# Patient Record
Sex: Male | Born: 1965 | Race: Black or African American | Hispanic: No | Marital: Single | Smoking: Never smoker
Health system: Southern US, Community
[De-identification: ages and names within clinical notes are randomized; demographics above are authoritative.]

## PROBLEM LIST (undated history)

## (undated) DIAGNOSIS — I10 Essential (primary) hypertension: Secondary | ICD-10-CM

## (undated) DIAGNOSIS — Q232 Congenital mitral stenosis: Secondary | ICD-10-CM

## (undated) DIAGNOSIS — G969 Disorder of central nervous system, unspecified: Secondary | ICD-10-CM

## (undated) DIAGNOSIS — I4819 Other persistent atrial fibrillation: Secondary | ICD-10-CM

## (undated) DIAGNOSIS — E119 Type 2 diabetes mellitus without complications: Secondary | ICD-10-CM

## (undated) HISTORY — PX: NOSE SURGERY: SHX723

---

## 2002-07-20 ENCOUNTER — Emergency Department (HOSPITAL_COMMUNITY): Admission: EM | Admit: 2002-07-20 | Discharge: 2002-07-20 | Payer: Self-pay | Admitting: Emergency Medicine

## 2017-02-02 DIAGNOSIS — Q232 Congenital mitral stenosis: Secondary | ICD-10-CM

## 2017-02-02 HISTORY — DX: Congenital mitral stenosis: Q23.2

## 2017-03-05 DIAGNOSIS — G969 Disorder of central nervous system, unspecified: Secondary | ICD-10-CM

## 2017-03-05 HISTORY — DX: Disorder of central nervous system, unspecified: G96.9

## 2017-03-12 ENCOUNTER — Ambulatory Visit: Payer: Self-pay | Admitting: Internal Medicine

## 2017-03-21 ENCOUNTER — Other Ambulatory Visit: Payer: Self-pay

## 2017-03-21 ENCOUNTER — Emergency Department (HOSPITAL_COMMUNITY): Payer: Medicaid Other

## 2017-03-21 ENCOUNTER — Inpatient Hospital Stay (HOSPITAL_COMMUNITY)
Admission: EM | Admit: 2017-03-21 | Discharge: 2017-04-09 | DRG: 853 | Disposition: A | Discharge status: Skilled Nursing Facility | Payer: Medicaid Other | Attending: Internal Medicine | Admitting: Internal Medicine

## 2017-03-21 ENCOUNTER — Encounter (HOSPITAL_COMMUNITY): Payer: Self-pay

## 2017-03-21 DIAGNOSIS — G992 Myelopathy in diseases classified elsewhere: Secondary | ICD-10-CM | POA: Diagnosis present

## 2017-03-21 DIAGNOSIS — Z7984 Long term (current) use of oral hypoglycemic drugs: Secondary | ICD-10-CM

## 2017-03-21 DIAGNOSIS — I480 Paroxysmal atrial fibrillation: Secondary | ICD-10-CM | POA: Diagnosis present

## 2017-03-21 DIAGNOSIS — S14106A Unspecified injury at C6 level of cervical spinal cord, initial encounter: Secondary | ICD-10-CM | POA: Diagnosis not present

## 2017-03-21 DIAGNOSIS — M4800 Spinal stenosis, site unspecified: Secondary | ICD-10-CM

## 2017-03-21 DIAGNOSIS — E875 Hyperkalemia: Secondary | ICD-10-CM | POA: Diagnosis not present

## 2017-03-21 DIAGNOSIS — G35 Multiple sclerosis: Secondary | ICD-10-CM | POA: Diagnosis present

## 2017-03-21 DIAGNOSIS — Z66 Do not resuscitate: Secondary | ICD-10-CM | POA: Diagnosis not present

## 2017-03-21 DIAGNOSIS — Z515 Encounter for palliative care: Secondary | ICD-10-CM

## 2017-03-21 DIAGNOSIS — I509 Heart failure, unspecified: Secondary | ICD-10-CM | POA: Diagnosis present

## 2017-03-21 DIAGNOSIS — R258 Other abnormal involuntary movements: Secondary | ICD-10-CM | POA: Diagnosis present

## 2017-03-21 DIAGNOSIS — I4891 Unspecified atrial fibrillation: Secondary | ICD-10-CM | POA: Diagnosis present

## 2017-03-21 DIAGNOSIS — Y838 Other surgical procedures as the cause of abnormal reaction of the patient, or of later complication, without mention of misadventure at the time of the procedure: Secondary | ICD-10-CM | POA: Diagnosis not present

## 2017-03-21 DIAGNOSIS — G959 Disease of spinal cord, unspecified: Secondary | ICD-10-CM | POA: Diagnosis present

## 2017-03-21 DIAGNOSIS — G061 Intraspinal abscess and granuloma: Secondary | ICD-10-CM | POA: Diagnosis present

## 2017-03-21 DIAGNOSIS — B9620 Unspecified Escherichia coli [E. coli] as the cause of diseases classified elsewhere: Secondary | ICD-10-CM | POA: Diagnosis present

## 2017-03-21 DIAGNOSIS — E872 Acidosis: Secondary | ICD-10-CM | POA: Diagnosis present

## 2017-03-21 DIAGNOSIS — I481 Persistent atrial fibrillation: Secondary | ICD-10-CM | POA: Diagnosis present

## 2017-03-21 DIAGNOSIS — T380X5A Adverse effect of glucocorticoids and synthetic analogues, initial encounter: Secondary | ICD-10-CM | POA: Diagnosis present

## 2017-03-21 DIAGNOSIS — G969 Disorder of central nervous system, unspecified: Secondary | ICD-10-CM | POA: Diagnosis present

## 2017-03-21 DIAGNOSIS — M48061 Spinal stenosis, lumbar region without neurogenic claudication: Secondary | ICD-10-CM | POA: Diagnosis present

## 2017-03-21 DIAGNOSIS — R531 Weakness: Secondary | ICD-10-CM | POA: Diagnosis present

## 2017-03-21 DIAGNOSIS — M899 Disorder of bone, unspecified: Secondary | ICD-10-CM | POA: Diagnosis present

## 2017-03-21 DIAGNOSIS — E1165 Type 2 diabetes mellitus with hyperglycemia: Secondary | ICD-10-CM | POA: Diagnosis not present

## 2017-03-21 DIAGNOSIS — G834 Cauda equina syndrome: Secondary | ICD-10-CM | POA: Diagnosis present

## 2017-03-21 DIAGNOSIS — I493 Ventricular premature depolarization: Secondary | ICD-10-CM | POA: Diagnosis present

## 2017-03-21 DIAGNOSIS — I10 Essential (primary) hypertension: Secondary | ICD-10-CM | POA: Diagnosis present

## 2017-03-21 DIAGNOSIS — L89312 Pressure ulcer of right buttock, stage 2: Secondary | ICD-10-CM | POA: Diagnosis present

## 2017-03-21 DIAGNOSIS — G360 Neuromyelitis optica [Devic]: Secondary | ICD-10-CM | POA: Diagnosis present

## 2017-03-21 DIAGNOSIS — N390 Urinary tract infection, site not specified: Secondary | ICD-10-CM | POA: Diagnosis present

## 2017-03-21 DIAGNOSIS — G8250 Quadriplegia, unspecified: Secondary | ICD-10-CM | POA: Diagnosis present

## 2017-03-21 DIAGNOSIS — A419 Sepsis, unspecified organism: Principal | ICD-10-CM | POA: Diagnosis present

## 2017-03-21 DIAGNOSIS — N179 Acute kidney failure, unspecified: Secondary | ICD-10-CM | POA: Diagnosis present

## 2017-03-21 DIAGNOSIS — Z79899 Other long term (current) drug therapy: Secondary | ICD-10-CM

## 2017-03-21 DIAGNOSIS — R0602 Shortness of breath: Secondary | ICD-10-CM

## 2017-03-21 DIAGNOSIS — L89322 Pressure ulcer of left buttock, stage 2: Secondary | ICD-10-CM | POA: Diagnosis present

## 2017-03-21 DIAGNOSIS — M4802 Spinal stenosis, cervical region: Secondary | ICD-10-CM | POA: Diagnosis present

## 2017-03-21 DIAGNOSIS — Z6838 Body mass index (BMI) 38.0-38.9, adult: Secondary | ICD-10-CM | POA: Diagnosis not present

## 2017-03-21 DIAGNOSIS — IMO0001 Reserved for inherently not codable concepts without codable children: Secondary | ICD-10-CM | POA: Diagnosis present

## 2017-03-21 DIAGNOSIS — I4892 Unspecified atrial flutter: Secondary | ICD-10-CM | POA: Diagnosis not present

## 2017-03-21 DIAGNOSIS — I4819 Other persistent atrial fibrillation: Secondary | ICD-10-CM | POA: Diagnosis present

## 2017-03-21 DIAGNOSIS — I110 Hypertensive heart disease with heart failure: Secondary | ICD-10-CM | POA: Diagnosis present

## 2017-03-21 DIAGNOSIS — Z419 Encounter for procedure for purposes other than remedying health state, unspecified: Secondary | ICD-10-CM

## 2017-03-21 DIAGNOSIS — L8990 Pressure ulcer of unspecified site, unspecified stage: Secondary | ICD-10-CM | POA: Clinically undetermined

## 2017-03-21 DIAGNOSIS — R29898 Other symptoms and signs involving the musculoskeletal system: Secondary | ICD-10-CM

## 2017-03-21 DIAGNOSIS — G8929 Other chronic pain: Secondary | ICD-10-CM | POA: Diagnosis present

## 2017-03-21 HISTORY — DX: Other persistent atrial fibrillation: I48.19

## 2017-03-21 HISTORY — DX: Type 2 diabetes mellitus without complications: E11.9

## 2017-03-21 HISTORY — DX: Essential (primary) hypertension: I10

## 2017-03-21 HISTORY — DX: Disorder of central nervous system, unspecified: G96.9

## 2017-03-21 LAB — COMPREHENSIVE METABOLIC PANEL
ALBUMIN: 4.1 g/dL (ref 3.5–5.0)
ALT: 29 U/L (ref 17–63)
AST: 36 U/L (ref 15–41)
Alkaline Phosphatase: 64 U/L (ref 38–126)
Anion gap: 9 (ref 5–15)
BILIRUBIN TOTAL: 1.2 mg/dL (ref 0.3–1.2)
BUN: 9 mg/dL (ref 6–20)
CHLORIDE: 106 mmol/L (ref 101–111)
CO2: 25 mmol/L (ref 22–32)
Calcium: 9.4 mg/dL (ref 8.9–10.3)
Creatinine, Ser: 0.84 mg/dL (ref 0.61–1.24)
GFR calc Af Amer: >60 mL/min (ref >60)
GFR calc non Af Amer: >60 mL/min (ref >60)
GLUCOSE: 179 mg/dL — AB (ref 65–99)
POTASSIUM: 3.9 mmol/L (ref 3.5–5.1)
SODIUM: 140 mmol/L (ref 135–145)
TOTAL PROTEIN: 7.5 g/dL (ref 6.5–8.1)

## 2017-03-21 LAB — CBC WITH DIFFERENTIAL/PLATELET
BASOS ABS: 0 10*3/uL (ref 0.0–0.1)
BASOS PCT: 0 %
EOS ABS: 0 10*3/uL (ref 0.0–0.7)
EOS PCT: 1 %
HCT: 39.5 % (ref 39.0–52.0)
Hemoglobin: 13.9 g/dL (ref 13.0–17.0)
Lymphocytes Relative: 19 %
Lymphs Abs: 1.5 10*3/uL (ref 0.7–4.0)
MCH: 30.1 pg (ref 26.0–34.0)
MCHC: 35.2 g/dL (ref 30.0–36.0)
MCV: 85.5 fL (ref 78.0–100.0)
MONO ABS: 0.7 10*3/uL (ref 0.1–1.0)
MONOS PCT: 8 %
NEUTROS ABS: 6 10*3/uL (ref 1.7–7.7)
Neutrophils Relative %: 72 %
PLATELETS: 258 10*3/uL (ref 150–400)
RBC: 4.62 MIL/uL (ref 4.22–5.81)
RDW: 12.9 % (ref 11.5–15.5)
WBC: 8.2 10*3/uL (ref 4.0–10.5)

## 2017-03-21 MED ORDER — ONDANSETRON HCL 4 MG/2ML IJ SOLN: 4.0000 mg | Freq: Four times a day (QID) | INTRAMUSCULAR | Status: DC | PRN

## 2017-03-21 MED ORDER — ACETAMINOPHEN 650 MG RE SUPP: 650.0000 mg | Freq: Four times a day (QID) | RECTAL | Status: DC | PRN

## 2017-03-21 MED ORDER — ENOXAPARIN SODIUM 80 MG/0.8ML ~~LOC~~ SOLN
80.0000 mg | Freq: Every day | SUBCUTANEOUS | Status: DC
  Filled 2017-03-21: qty 0.8

## 2017-03-21 MED ORDER — SODIUM CHLORIDE 0.9% FLUSH
3.0000 mL | Freq: Two times a day (BID) | INTRAVENOUS | Status: DC
  Administered 2017-03-22 – 2017-03-30 (×8): 3 mL via INTRAVENOUS

## 2017-03-21 MED ORDER — BISACODYL 5 MG PO TBEC
5.0000 mg | DELAYED_RELEASE_TABLET | Freq: Every day | ORAL | Status: DC | PRN
  Administered 2017-03-27: 5 mg via ORAL
  Filled 2017-03-21: qty 1

## 2017-03-21 MED ORDER — INSULIN ASPART 100 UNIT/ML ~~LOC~~ SOLN
0.0000 [IU] | Freq: Three times a day (TID) | SUBCUTANEOUS | Status: DC
  Administered 2017-03-22: 3 [IU] via SUBCUTANEOUS
  Administered 2017-03-22: 2 [IU] via SUBCUTANEOUS
  Administered 2017-03-23 – 2017-03-24 (×4): 7 [IU] via SUBCUTANEOUS

## 2017-03-21 MED ORDER — INSULIN ASPART 100 UNIT/ML ~~LOC~~ SOLN
0.0000 [IU] | Freq: Every day | SUBCUTANEOUS | Status: DC
  Administered 2017-03-22: 2 [IU] via SUBCUTANEOUS
  Administered 2017-03-22 – 2017-03-24 (×3): 4 [IU] via SUBCUTANEOUS
  Administered 2017-03-25 – 2017-03-27 (×2): 5 [IU] via SUBCUTANEOUS
  Administered 2017-03-30 – 2017-03-31 (×2): 3 [IU] via SUBCUTANEOUS
  Administered 2017-04-01: 4 [IU] via SUBCUTANEOUS
  Administered 2017-04-02 – 2017-04-04 (×3): 3 [IU] via SUBCUTANEOUS
  Administered 2017-04-06 – 2017-04-08 (×3): 2 [IU] via SUBCUTANEOUS
  Filled 2017-03-21: qty 1

## 2017-03-21 MED ORDER — LORAZEPAM 2 MG/ML IJ SOLN: 1.0000 mg | Freq: Once | INTRAMUSCULAR | Status: DC

## 2017-03-21 MED ORDER — LISINOPRIL 10 MG PO TABS
10.0000 mg | ORAL_TABLET | Freq: Every day | ORAL | Status: DC
  Administered 2017-03-22: 10 mg via ORAL
  Filled 2017-03-21: qty 1

## 2017-03-21 MED ORDER — SODIUM CHLORIDE 0.9 % IV SOLN
INTRAVENOUS | Status: DC
  Administered 2017-03-21: 13:00:00 via INTRAVENOUS

## 2017-03-21 MED ORDER — SODIUM CHLORIDE 0.9% FLUSH: 3.0000 mL | INTRAVENOUS | Status: DC | PRN

## 2017-03-21 MED ORDER — GADOBENATE DIMEGLUMINE 529 MG/ML IV SOLN
20.0000 mL | Freq: Once | INTRAVENOUS | Status: AC | PRN
  Administered 2017-03-21: 20 mL via INTRAVENOUS

## 2017-03-21 MED ORDER — ONDANSETRON HCL 4 MG PO TABS: 4.0000 mg | ORAL_TABLET | Freq: Four times a day (QID) | ORAL | Status: DC | PRN

## 2017-03-21 MED ORDER — LORAZEPAM 2 MG/ML IJ SOLN
1.0000 mg | Freq: Once | INTRAMUSCULAR | Status: AC
  Administered 2017-03-21: 1 mg via INTRAVENOUS
  Filled 2017-03-21: qty 1

## 2017-03-21 MED ORDER — B COMPLEX-C PO TABS
1.0000 | ORAL_TABLET | Freq: Every day | ORAL | Status: DC
  Administered 2017-03-22 – 2017-04-09 (×19): 1 via ORAL
  Filled 2017-03-21 (×19): qty 1

## 2017-03-21 MED ORDER — SODIUM CHLORIDE 0.9% FLUSH
3.0000 mL | Freq: Two times a day (BID) | INTRAVENOUS | Status: DC
  Administered 2017-03-22 – 2017-04-08 (×17): 3 mL via INTRAVENOUS

## 2017-03-21 MED ORDER — HYDROCODONE-ACETAMINOPHEN 5-325 MG PO TABS
1.0000 | ORAL_TABLET | ORAL | Status: DC | PRN
  Administered 2017-03-22 – 2017-03-30 (×7): 2 via ORAL
  Filled 2017-03-21 (×8): qty 2

## 2017-03-21 MED ORDER — SENNOSIDES-DOCUSATE SODIUM 8.6-50 MG PO TABS: 1.0000 | ORAL_TABLET | Freq: Every evening | ORAL | Status: DC | PRN

## 2017-03-21 MED ORDER — SODIUM CHLORIDE 0.9 % IV SOLN: 250.0000 mL | INTRAVENOUS | Status: DC | PRN

## 2017-03-21 MED ORDER — ACETAMINOPHEN 325 MG PO TABS
650.0000 mg | ORAL_TABLET | Freq: Four times a day (QID) | ORAL | Status: DC | PRN
  Administered 2017-03-31 – 2017-04-06 (×3): 650 mg via ORAL
  Filled 2017-03-21 (×3): qty 2

## 2017-03-21 NOTE — ED Notes (Signed)
Pharmacy messaged about unverified meds 

## 2017-03-21 NOTE — ED Notes (Signed)
Pt still at MRI

## 2017-03-21 NOTE — ED Provider Notes (Signed)
[facility] Provider Note   CSN: [id number] Arrival date & time: 03/21/17  2409     History   Chief Complaint Chief Complaint  Patient presents with  . Fall    HPI Dylan [name] is a 52 y.o. male.  52 year old male presents with increasing bilateral lower extremity weakness times several months.  Weakness is mostly on the right side but is now somewhat also on the left side.  Has been diagnosed with sciatica in the past.  Denies any back pain.  No loss of bowel or bladder function.  States that he is unable to walk at this time.  Denies any perineal numbness.  No fever or chills.  Symptoms better with remaining rest.  [facility] but cannot have an MRI due to his body habitus.  Was sent here for further management      Past Medical History:  Diagnosis Date  . Diabetes mellitus without complication ([place])   . Hypertension     There are no active problems to display for this patient.   History reviewed. No pertinent surgical history.     Home Medications    Prior to Admission medications   Medication Sig Start Date End Date Taking? Authorizing Provider  b complex vitamins tablet Take 1 tablet by mouth daily.   Yes [provider]  lisinopril (PRINIVIL,ZESTRIL) 10 MG tablet Take 10 mg by mouth daily.   Yes [provider]  metFORMIN (GLUCOPHAGE) 500 MG tablet Take 500 mg by mouth 2 (two) times daily with a meal.   Yes [provider]    Family History History reviewed. No pertinent family history.  Social History Social History   Tobacco Use  . Smoking status: Never Smoker  . Smokeless tobacco: Never Used  Substance Use Topics  . Alcohol use: No    Frequency: Never  . Drug use: No     Allergies   Patient has no known allergies.   Review of Systems Review of Systems  All other systems reviewed and are negative.    Physical Exam Updated Vital Signs BP (!)  145/75   Pulse 80   Temp 99.3 F (37.4 C) (Oral)   Resp 20   SpO2 95%   Physical Exam  Constitutional: He is oriented to person, place, and time. He appears well-developed and well-nourished.  Non-toxic appearance. No distress.  HENT:  Head: Normocephalic and atraumatic.  Eyes: Conjunctivae, EOM and lids are normal. Pupils are equal, round, and reactive to light.  Neck: Normal range of motion. Neck supple. No tracheal deviation present. No thyroid mass present.  Cardiovascular: Normal rate, regular rhythm and normal heart sounds. Exam reveals no gallop.  No murmur heard. Pulmonary/Chest: Effort normal and breath sounds normal. No stridor. No respiratory distress. He has no decreased breath sounds. He has no wheezes. He has no rhonchi. He has no rales.  Abdominal: Soft. Normal appearance and bowel sounds are normal. He exhibits no distension. There is no tenderness. There is no rebound and no CVA tenderness.  Musculoskeletal: Normal range of motion. He exhibits no edema or tenderness.  Neurological: He is alert and oriented to person, place, and time. He displays no atrophy and no tremor. No cranial nerve deficit or sensory deficit. GCS eye subscore is 4. GCS verbal subscore is 5. GCS motor subscore is 6.  Reflex Scores:      Patellar reflexes are 0 on the right side and 0 on the  left side. Right lower extremity strength is 2/5 Left lower extremity strength is 3/5   Skin: Skin is warm and dry. No abrasion and no rash noted.  Psychiatric: He has a normal mood and affect. His speech is normal and behavior is normal.  Nursing note and vitals reviewed.    ED Treatments / Results  Labs (all labs ordered are listed, but only abnormal results are displayed) Labs Reviewed  COMPREHENSIVE METABOLIC PANEL - Abnormal; Notable for the following components:      Result Value   Glucose, Bld 179 (*)    All other components within normal limits  CBC WITH DIFFERENTIAL/PLATELET    EKG  EKG  Interpretation None       Radiology Dg Chest 2 View  Result Date: 03/21/2017 CLINICAL DATA:  Right-sided weakness. EXAM: CHEST  2 VIEW COMPARISON:  None. FINDINGS: AP and lateral views of the chest were obtained. The lungs are clear without focal pneumonia, edema, pneumothorax or pleural effusion. Minimal atelectasis noted left base. The cardiopericardial silhouette is within normal limits for size. The visualized bony structures of the thorax are intact. Telemetry leads overlie the chest. IMPRESSION: No active cardiopulmonary disease. Electronically Signed   By: Misty Stanley M.D.   On: 03/21/2017 13:08   Mr Lumbar Spine Wo Contrast  Result Date: 03/21/2017 CLINICAL DATA:  Increasing leg weakness.  Urinary incontinence. EXAM: MRI LUMBAR SPINE WITHOUT CONTRAST TECHNIQUE: Multiplanar, multisequence MR imaging of the lumbar spine was performed. No intravenous contrast was administered. COMPARISON:  None. FINDINGS: Segmentation:  Assumed standard. Alignment: Mild straightening of the lumbar lordosis. Sagittal alignment is maintained. Vertebrae: Diffusely heterogeneous marrow signal throughout the lumbar spine with multifocal areas of abnormal marrow replacement. Similar appearing marrow signal abnormality is seen in the visualized sacrum, bilateral iliac bones, and bilateral lower thoracic ribs. Conus medullaris and cauda equina: Conus extends to the L1-L2 level. Patchy abnormal signal within the visualized lower thoracic cord. Clumping of the proximal cauda equina nerve roots due to downstream stenosis. Paraspinal and other soft tissues: Negative. Disc levels: T10-T11: Right lateral and anterolateral disc osteophyte complex. No stenosis. T10-T11: Right lateral disc osteophyte complex.  No stenosis. T12-L1:  Negative. L1-L2:  Negative. L2-L3: Diffuse disc bulge with superimposed large central disc protrusion and mild left greater than right facet arthropathy resulting in severe central spinal canal  stenosis. No neuroforaminal stenosis. L3-L4: Diffuse disc bulge with superimposed central and left paracentral disc protrusion. Mild bilateral facet arthropathy. Severe central spinal canal stenosis and moderate left neuroforaminal stenosis. L4-L5: Diffuse disc bulge, slightly asymmetric to the left. Moderate bilateral facet arthropathy. Severe bilateral lateral recess stenosis. Moderate central spinal canal stenosis. Mild to moderate bilateral neuroforaminal stenosis. L5-S1: Diffuse disc bulge with superimposed central disc protrusion. Mild narrowing of the bilateral lateral recesses. Disc material abuts the descending conjoined left S1 and S2 nerve roots. Mild left and severe right neuroforaminal stenosis. IMPRESSION: 1. Diffusely heterogeneous marrow signal throughout the visualized thoracolumbar spine, pelvis, and lower thoracic ribs, concerning for multiple myeloma or metastatic disease. 2. Patchy, abnormal signal throughout the visualized lower thoracic spinal cord, nonspecific. Recommend further evaluation with MRI of the cervical and thoracic spine with and without contrast. 3. Multilevel degenerative changes throughout the lumbar spine with severe central spinal canal stenosis at L2-L3 and L3-L4, likely accounting for the patient's symptoms. 4. Severe right neuroforaminal stenosis at L5-S1. Moderate left neuroforaminal stenosis at L3-L4. Electronically Signed   By: Titus Dubin M.D.   On: 03/21/2017 16:27  Procedures Procedures (including critical care time)  Medications Ordered in ED Medications  0.9 %  sodium chloride infusion ( Intravenous New Bag/Given 03/21/17 1328)  LORazepam (ATIVAN) injection 1 mg (1 mg Intravenous Given 03/21/17 1027)     Initial Impression / Assessment and Plan / ED Course  I have reviewed the triage vital signs and the nursing notes.  Pertinent labs & imaging results that were available during my care of the patient were reviewed by me and considered in my  medical decision making (see chart for details).     Patient with MRI results noted of his lumbar spine.  Radiology recommended MRI of his thoracic and cervical spines with the results noted.  Consulted the neuro hospitalist who will see the patient and recommends hospitalist admission.  He will decide if the patient will be started on steroids for possible multiple sclerosis.  Final Clinical Impressions(s) / ED Diagnoses   Final diagnoses:  Cauda equina syndrome [facility])    ED Discharge Orders    None       Lacretia Leigh, MD 03/21/17 2242

## 2017-03-21 NOTE — H&P (Signed)
History and Physical    Dylan [name] TMA:[id number] DOB: 1965-12-09 DOA: 03/21/2017  PCP: Patient, No Pcp Per   Patient coming from: Home  Chief Complaint: Generalized weakness, worse in right leg   HPI: Dylan [name] is a 52 y.o. male with medical history significant for hypertension, chronic low back pain with sciatica, and type 2 diabetes mellitus, now presenting to the emergency department with generalized weakness, particularly weak in the right leg.  Patient reports suffering from chronic low back pain with sciatica recommended for surgery previously but declined.  He reports progressive generalized weakness that began approximately 7 months ago and has worsened significantly in recent days, particularly in the right leg.  Overnight, he had out of bed, was too weak to hold his own weight, lowered himself to the ground, and was unable to get up.  He eventually called EMS and was transported to the hospital.  He denies headache or change in vision or hearing.  Reports that his low back has not been hurting much recently.  Denies fevers or chills.  ED Course: Upon arrival to the ED, patient is found to be afebrile, saturating well on room air, and with vitals otherwise stable.  Chest x-rays negative for acute cardiopulmonary disease, chemistry panel and CBC are unremarkable, and MRI of the lumbar spine reveals diffuse heterogenous marrow signal throughout the visualized thoracolumbar spine, pelvis, and lower thoracic ribs concerning for multiple myeloma or metastatic disease, as well as lumbar degenerative changes with severe canal stenosis.  Patient then underwent cervical and thoracic MRI, again with diffusely abnormal bone marrow signal and scattered lesions throughout the cervical and thoracic cord, as well as in the visualized pons, medulla, and cerebellum.  Neurology was consulted by the ED physician has evaluated the patient in the emergency department, and recommends a medical admission  for ongoing evaluation and management.  Review of Systems:  All other systems reviewed and apart from HPI, are negative.  Past Medical History:  Diagnosis Date  . Diabetes mellitus without complication ([place])   . Hypertension     History reviewed. No pertinent surgical history.   reports that  has never smoked. he has never used smokeless tobacco. He reports that he does not drink alcohol or use drugs.  No Known Allergies  History reviewed. No pertinent family history.   Prior to Admission medications   Medication Sig Start Date End Date Taking? Authorizing Provider  b complex vitamins tablet Take 1 tablet by mouth daily.   Yes [provider]  lisinopril (PRINIVIL,ZESTRIL) 10 MG tablet Take 10 mg by mouth daily.   Yes [provider]  metFORMIN (GLUCOPHAGE) 500 MG tablet Take 500 mg by mouth 2 (two) times daily with a meal.   Yes [provider]    Physical Exam: Vitals:   03/21/17 1715 03/21/17 1730 03/21/17 2149 03/21/17 2246  BP: 132/78 134/77 (!) 141/78 139/81  Pulse: 88 77 75 81  Resp:   16 14  Temp:      TempSrc:      SpO2: 99% 99% 96% 98%  Weight:      Height:          Constitutional: NAD, calm  Eyes: PERTLA, lids and conjunctivae normal ENMT: Mucous membranes are moist. Posterior pharynx clear of any exudate or lesions.   Neck: normal, supple, no masses, no thyromegaly Respiratory: clear to auscultation bilaterally, no wheezing, no crackles. Normal respiratory effort.   Cardiovascular: S1 & S2 heard, regular rate and rhythm.  No significant JVD. Abdomen: No distension, no tenderness, no masses palpated. Bowel sounds normal.  Musculoskeletal: no clubbing / cyanosis. No joint deformity upper and lower extremities.   Skin: no significant rashes, lesions, ulcers. Warm, dry, well-perfused. Neurologic: CN 2-12 grossly intact. Sensation to light touch intact. Strength diminished in bilateral LE's > UE's. .  Psychiatric:  Alert and  oriented x 3. Pleasant and cooperative.     Labs on Admission: I have personally reviewed following labs and imaging studies  CBC: Recent Labs  Lab 03/21/17 0853  WBC 8.2  NEUTROABS 6.0  HGB 13.9  HCT 39.5  MCV 85.5  PLT 299   Basic Metabolic Panel: Recent Labs  Lab 03/21/17 0853  NA 140  K 3.9  CL 106  CO2 25  GLUCOSE 179*  BUN 9  CREATININE 0.84  CALCIUM 9.4   GFR: Estimated Creatinine Clearance: 173.9 mL/min (by C-G formula based on SCr of 0.84 mg/dL). Liver Function Tests: Recent Labs  Lab 03/21/17 0853  AST 36  ALT 29  ALKPHOS 64  BILITOT 1.2  PROT 7.5  ALBUMIN 4.1   No results for input(s): LIPASE, AMYLASE in the last 168 hours. No results for input(s): AMMONIA in the last 168 hours. Coagulation Profile: No results for input(s): INR, PROTIME in the last 168 hours. Cardiac Enzymes: No results for input(s): CKTOTAL, CKMB, CKMBINDEX, TROPONINI in the last 168 hours. BNP (last 3 results) No results for input(s): PROBNP in the last 8760 hours. HbA1C: No results for input(s): HGBA1C in the last 72 hours. CBG: No results for input(s): GLUCAP in the last 168 hours. Lipid Profile: No results for input(s): CHOL, HDL, LDLCALC, TRIG, CHOLHDL, LDLDIRECT in the last 72 hours. Thyroid Function Tests: No results for input(s): TSH, T4TOTAL, FREET4, T3FREE, THYROIDAB in the last 72 hours. Anemia Panel: No results for input(s): VITAMINB12, FOLATE, FERRITIN, TIBC, IRON, RETICCTPCT in the last 72 hours. Urine analysis: No results found for: COLORURINE, APPEARANCEUR, LABSPEC, PHURINE, GLUCOSEU, HGBUR, BILIRUBINUR, KETONESUR, PROTEINUR, UROBILINOGEN, NITRITE, LEUKOCYTESUR Sepsis Labs: '@LABRCNTIP'$ (procalcitonin:4,lacticidven:4) )No results found for this or any previous visit (from the past 240 hour(s)).   Radiological Exams on Admission: Dg Chest 2 View  Result Date: 03/21/2017 CLINICAL DATA:  Right-sided weakness. EXAM: CHEST  2 VIEW COMPARISON:  None. FINDINGS:  AP and lateral views of the chest were obtained. The lungs are clear without focal pneumonia, edema, pneumothorax or pleural effusion. Minimal atelectasis noted left base. The cardiopericardial silhouette is within normal limits for size. The visualized bony structures of the thorax are intact. Telemetry leads overlie the chest. IMPRESSION: No active cardiopulmonary disease. Electronically Signed   By: Misty Stanley M.D.   On: 03/21/2017 13:08   Mr Lumbar Spine Wo Contrast  Result Date: 03/21/2017 CLINICAL DATA:  Increasing leg weakness.  Urinary incontinence. EXAM: MRI LUMBAR SPINE WITHOUT CONTRAST TECHNIQUE: Multiplanar, multisequence MR imaging of the lumbar spine was performed. No intravenous contrast was administered. COMPARISON:  None. FINDINGS: Segmentation:  Assumed standard. Alignment: Mild straightening of the lumbar lordosis. Sagittal alignment is maintained. Vertebrae: Diffusely heterogeneous marrow signal throughout the lumbar spine with multifocal areas of abnormal marrow replacement. Similar appearing marrow signal abnormality is seen in the visualized sacrum, bilateral iliac bones, and bilateral lower thoracic ribs. Conus medullaris and cauda equina: Conus extends to the L1-L2 level. Patchy abnormal signal within the visualized lower thoracic cord. Clumping of the proximal cauda equina nerve roots due to downstream stenosis. Paraspinal and other soft tissues: Negative. Disc levels: T10-T11: Right lateral and anterolateral  disc osteophyte complex. No stenosis. T10-T11: Right lateral disc osteophyte complex.  No stenosis. T12-L1:  Negative. L1-L2:  Negative. L2-L3: Diffuse disc bulge with superimposed large central disc protrusion and mild left greater than right facet arthropathy resulting in severe central spinal canal stenosis. No neuroforaminal stenosis. L3-L4: Diffuse disc bulge with superimposed central and left paracentral disc protrusion. Mild bilateral facet arthropathy. Severe central  spinal canal stenosis and moderate left neuroforaminal stenosis. L4-L5: Diffuse disc bulge, slightly asymmetric to the left. Moderate bilateral facet arthropathy. Severe bilateral lateral recess stenosis. Moderate central spinal canal stenosis. Mild to moderate bilateral neuroforaminal stenosis. L5-S1: Diffuse disc bulge with superimposed central disc protrusion. Mild narrowing of the bilateral lateral recesses. Disc material abuts the descending conjoined left S1 and S2 nerve roots. Mild left and severe right neuroforaminal stenosis. IMPRESSION: 1. Diffusely heterogeneous marrow signal throughout the visualized thoracolumbar spine, pelvis, and lower thoracic ribs, concerning for multiple myeloma or metastatic disease. 2. Patchy, abnormal signal throughout the visualized lower thoracic spinal cord, nonspecific. Recommend further evaluation with MRI of the cervical and thoracic spine with and without contrast. 3. Multilevel degenerative changes throughout the lumbar spine with severe central spinal canal stenosis at L2-L3 and L3-L4, likely accounting for the patient's symptoms. 4. Severe right neuroforaminal stenosis at L5-S1. Moderate left neuroforaminal stenosis at L3-L4. Electronically Signed   By: Titus Dubin M.D.   On: 03/21/2017 16:27   Mr Cervical Spine W Or Wo Contrast  Result Date: 03/21/2017 CLINICAL DATA:  Bilateral lower extremity weakness. Urinary incontinence. Abnormal distal spinal cord on lumbar spine MRI today. EXAM: MRI CERVICAL AND THORACIC SPINE WITHOUT AND WITH CONTRAST TECHNIQUE: Multiplanar and multiecho pulse sequences of the cervical spine, to include the craniocervical junction and cervicothoracic junction, and thoracic spine, were obtained without and with intravenous contrast. CONTRAST:  9m MULTIHANCE GADOBENATE DIMEGLUMINE 529 MG/ML IV SOLN COMPARISON:  Lumbar spine MRI today FINDINGS: MRI CERVICAL SPINE FINDINGS The study is moderately motion degraded. Alignment: Cervical spine  straightening.  No significant listhesis. Vertebrae: Diffuse heterogeneous replacement of normal marrow signal throughout the spine as described on prior lumbar MRI. No dominant enhancing lesion. Preserved vertebral body heights without evidence of fracture. Cord: There is abnormal T2 hyperintensity in the right lateral spinal cord at C3-4 extending over a length of approximately 1.5 cm. Smaller areas of abnormal T2 signal are present at the superior C6 endplate level and at the C6-7 disc space level, with the latter demonstrating enhancement. Posterior Fossa, vertebral arteries, paraspinal tissues: Discrete 4 mm T2 hyperintense nonenhancing lesion in the medulla. Additional T2 hyperintense lesions in the pons and left cerebellum. Disc levels: The cervical spinal canal is small in caliber diffusely on a congenital basis. C2-3: Negative. C3-4: Shallow central disc protrusion without significant stenosis. C4-5: Mild disc bulging and uncovertebral spurring result in mild spinal stenosis and moderate bilateral neural foraminal stenosis. C5-6: Broad-based posterior disc osteophyte complex results in moderate spinal stenosis with mild cord flattening and moderate to severe bilateral neural foraminal stenosis. C6-7: Broad-based posterior disc osteophyte complex and central disc extrusion result in severe spinal stenosis with moderate cord flattening and severe bilateral neural foraminal stenosis. C7-T1: Mild uncovertebral spurring results in at most mild bilateral neural foraminal stenosis. No spinal stenosis. MRI THORACIC SPINE FINDINGS The study is mildly motion degraded. Alignment: Normal. Vertebrae: Diffuse heterogeneous replacement of normal marrow signal throughout the spine and ribs as well as visualized clavicles and sternum. No dominant enhancing lesion. Preserved vertebral body heights without evidence of fracture. Cord:  As seen on today's earlier lumbar MRI, there is patchy T2 hyperintensity in the distal  thoracic spinal cord, with the most discrete lesion being on the left at T12. There is a small T2 hyperintense focus in the left central cord at T6-7. A small lesion versus artifact is questioned at T3. There is the suggestion of focal enhancement along the midline dorsal cord at T5 on axial images (series 21, image 21), however this is not confirmed on sagittal images. Paraspinal and other soft tissues: Unremarkable. Disc levels: Endplate spurring results in mild left neural foraminal stenosis at T1-2. A right posterolateral/foraminal disc osteophyte at T2-3 results in mild right neural foraminal stenosis. A shallow left paracentral disc osteophyte complex and left facet hypertrophy at T6-7 result in mild spinal stenosis and mild left neural foraminal stenosis. A slightly larger left paracentral disc osteophyte complex at T7-8 combines with facet hypertrophy to result in mild spinal stenosis. There is at most mild disc bulging more inferiorly in the thoracic spine without significant stenosis. IMPRESSION: 1. Scattered T2 hyperintense lesions throughout the cervical and thoracic spinal cord as well as in the pons, medulla, and cerebellum. A lesion at C6-7 is enhancing. While some lesions are associated with spinal stenosis and could in isolation reflect spondylotic myelopathy, the presence of lesions elsewhere in the cord and posterior fossa are indicative of a more widespread process such as demyelinating disease. Infection, inflammatory conditions such as sarcoidosis, and metastatic disease are additional considerations. Brain MRI and CSF analysis are recommended. 2. 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 3. Mild spinal stenosis at T6-7 and T7-8 due to disc and facet degeneration. 4. Diffusely abnormal bone marrow signal. Considerations include multiple myeloma, metastatic disease, and other infiltrative/myelofibrotic marrow processes. Electronically  Signed   By: Logan Bores M.D.   On: 03/21/2017 21:44   Mr Thoracic Spine W Wo Contrast  Result Date: 03/21/2017 CLINICAL DATA:  Bilateral lower extremity weakness. Urinary incontinence. Abnormal distal spinal cord on lumbar spine MRI today. EXAM: MRI CERVICAL AND THORACIC SPINE WITHOUT AND WITH CONTRAST TECHNIQUE: Multiplanar and multiecho pulse sequences of the cervical spine, to include the craniocervical junction and cervicothoracic junction, and thoracic spine, were obtained without and with intravenous contrast. CONTRAST:  74m MULTIHANCE GADOBENATE DIMEGLUMINE 529 MG/ML IV SOLN COMPARISON:  Lumbar spine MRI today FINDINGS: MRI CERVICAL SPINE FINDINGS The study is moderately motion degraded. Alignment: Cervical spine straightening.  No significant listhesis. Vertebrae: Diffuse heterogeneous replacement of normal marrow signal throughout the spine as described on prior lumbar MRI. No dominant enhancing lesion. Preserved vertebral body heights without evidence of fracture. Cord: There is abnormal T2 hyperintensity in the right lateral spinal cord at C3-4 extending over a length of approximately 1.5 cm. Smaller areas of abnormal T2 signal are present at the superior C6 endplate level and at the C6-7 disc space level, with the latter demonstrating enhancement. Posterior Fossa, vertebral arteries, paraspinal tissues: Discrete 4 mm T2 hyperintense nonenhancing lesion in the medulla. Additional T2 hyperintense lesions in the pons and left cerebellum. Disc levels: The cervical spinal canal is small in caliber diffusely on a congenital basis. C2-3: Negative. C3-4: Shallow central disc protrusion without significant stenosis. C4-5: Mild disc bulging and uncovertebral spurring result in mild spinal stenosis and moderate bilateral neural foraminal stenosis. C5-6: Broad-based posterior disc osteophyte complex results in moderate spinal stenosis with mild cord flattening and moderate to severe bilateral neural foraminal  stenosis. C6-7: Broad-based posterior disc osteophyte complex  and central disc extrusion result in severe spinal stenosis with moderate cord flattening and severe bilateral neural foraminal stenosis. C7-T1: Mild uncovertebral spurring results in at most mild bilateral neural foraminal stenosis. No spinal stenosis. MRI THORACIC SPINE FINDINGS The study is mildly motion degraded. Alignment: Normal. Vertebrae: Diffuse heterogeneous replacement of normal marrow signal throughout the spine and ribs as well as visualized clavicles and sternum. No dominant enhancing lesion. Preserved vertebral body heights without evidence of fracture. Cord: As seen on today's earlier lumbar MRI, there is patchy T2 hyperintensity in the distal thoracic spinal cord, with the most discrete lesion being on the left at T12. There is a small T2 hyperintense focus in the left central cord at T6-7. A small lesion versus artifact is questioned at T3. There is the suggestion of focal enhancement along the midline dorsal cord at T5 on axial images (series 21, image 21), however this is not confirmed on sagittal images. Paraspinal and other soft tissues: Unremarkable. Disc levels: Endplate spurring results in mild left neural foraminal stenosis at T1-2. A right posterolateral/foraminal disc osteophyte at T2-3 results in mild right neural foraminal stenosis. A shallow left paracentral disc osteophyte complex and left facet hypertrophy at T6-7 result in mild spinal stenosis and mild left neural foraminal stenosis. A slightly larger left paracentral disc osteophyte complex at T7-8 combines with facet hypertrophy to result in mild spinal stenosis. There is at most mild disc bulging more inferiorly in the thoracic spine without significant stenosis. IMPRESSION: 1. Scattered T2 hyperintense lesions throughout the cervical and thoracic spinal cord as well as in the pons, medulla, and cerebellum. A lesion at C6-7 is enhancing. While some lesions are  associated with spinal stenosis and could in isolation reflect spondylotic myelopathy, the presence of lesions elsewhere in the cord and posterior fossa are indicative of a more widespread process such as demyelinating disease. Infection, inflammatory conditions such as sarcoidosis, and metastatic disease are additional considerations. Brain MRI and CSF analysis are recommended. 2. 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 3. Mild spinal stenosis at T6-7 and T7-8 due to disc and facet degeneration. 4. Diffusely abnormal bone marrow signal. Considerations include multiple myeloma, metastatic disease, and other infiltrative/myelofibrotic marrow processes. Electronically Signed   By: Logan Bores M.D.   On: 03/21/2017 21:44    EKG: Not performed.   Assessment/Plan  1. Weakness; vertebral and spinal cord lesions  - Presents with generalized weakness x7 mos, worsening significantly, particularly on the right in recent days  - MRI L-spine reveals diffuse skeletal abnormalities and cord lesions  - DDx remains broad and includes metastatic disease, demyelinating condition, multiple myeloma  - No renal dysfunction or anemia  - Neurology is consulting and much appreciated, will follow-up on recommendations  - Check MRI brain, PT eval, supportive care    2. Hypertension  - BP at goal  - Continue lisinopril    3. Type II DM  - A1c was 12.6% remotely  - Managed at home with metformin, held on admission  - Follow CBG's and start SSI with Novolog    DVT prophylaxis: Lovenox Code Status: Full  Family Communication: Discussed with patient Disposition Plan: Admit to telemetry Consults called: Neurology Admission status: Inpatient     Vianne Bulls, MD [facility] Pager [phone]  If 7PM-7AM, please contact night-coverage [email or web address] Password [facility]  03/21/2017, 11:30 PM

## 2017-03-21 NOTE — ED Notes (Signed)
Patient transported to MRI 

## 2017-03-21 NOTE — ED Provider Notes (Addendum)
[facility]-EMERGENCY DEPT Provider Note   CSN: [id number] Arrival date & time: 03/21/17  5726     History   Chief Complaint Chief Complaint  Patient presents with  . Fall    HPI Dylan [name] is a 52 y.o. male.  Patient brought in by EMS.  From home.  Patient has been having some trouble with increasing weakness to both legs.  Patient has long-standing right lower extremity weakness.  Has been told that he probably has a sciatic problem.  The patient has refused to follow-up and did not want surgery.  According to family member symptoms getting significantly worse.  Patient is now almost too weak to stand in the legs.  With today there was a little bit of urine incontinence as well.  Patient occasionally has low back pain but denies any pain today.      History reviewed. No pertinent past medical history.  There are no active problems to display for this patient.   History reviewed. No pertinent surgical history.     Home Medications    Prior to Admission medications   Medication Sig Start Date End Date Taking? Authorizing Provider  b complex vitamins tablet Take 1 tablet by mouth daily.   Yes [provider]  lisinopril (PRINIVIL,ZESTRIL) 10 MG tablet Take 10 mg by mouth daily.   Yes [provider]  metFORMIN (GLUCOPHAGE) 500 MG tablet Take 500 mg by mouth 2 (two) times daily with a meal.   Yes [provider]    Family History History reviewed. No pertinent family history.  Social History Social History   Tobacco Use  . Smoking status: Never Smoker  . Smokeless tobacco: Never Used  Substance Use Topics  . Alcohol use: No    Frequency: Never  . Drug use: No     Allergies   Patient has no known allergies.   Review of Systems Review of Systems  Constitutional: Negative for fever.  HENT: Negative for congestion.   Eyes: Negative for redness.  Respiratory: Negative for shortness of breath.     Cardiovascular: Negative for chest pain.  Gastrointestinal: Negative for abdominal pain.  Genitourinary: Positive for difficulty urinating.  Musculoskeletal: Positive for back pain.  Skin: Negative for rash and wound.  Neurological: Positive for weakness. Negative for numbness and headaches.  Hematological: Does not bruise/bleed easily.  Psychiatric/Behavioral: Negative for confusion.     Physical Exam Updated Vital Signs BP (!) 163/98 (BP Location: Right Arm)   Pulse 93   Temp 98.6 F (37 C) (Oral)   Resp 15   SpO2 100%   Physical Exam  Constitutional: He is oriented to person, place, and time. He appears well-developed and well-nourished. No distress.  HENT:  Head: Normocephalic and atraumatic.  Mouth/Throat: Oropharynx is clear and moist.  Eyes: Conjunctivae and EOM are normal. Pupils are equal, round, and reactive to light.  Neck: Neck supple.  Cardiovascular: Normal rate and regular rhythm.  Pulmonary/Chest: Effort normal and breath sounds normal. No respiratory distress.  Abdominal: Soft. Bowel sounds are normal. There is no tenderness.  Musculoskeletal: He exhibits no tenderness.  Marked weakness to both lower extremities right greater than left.  Neurological: He is alert and oriented to person, place, and time. No cranial nerve deficit.  Marked weakness to both lower extremities right greater than left.  Skin: Skin is warm.  Nursing note and vitals reviewed.    ED Treatments / Results  Labs (all labs ordered are listed, but only  abnormal results are displayed) Labs Reviewed  COMPREHENSIVE METABOLIC PANEL - Abnormal; Notable for the following components:      Result Value   Glucose, Bld 179 (*)    All other components within normal limits  CBC WITH DIFFERENTIAL/PLATELET    EKG  EKG Interpretation None       Radiology No results found.  Procedures Procedures (including critical care time)  Medications Ordered in ED Medications  LORazepam  (ATIVAN) injection 1 mg (1 mg Intravenous Given 03/21/17 1027)     Initial Impression / Assessment and Plan / ED Course  I have reviewed the triage vital signs and the nursing notes.  Pertinent labs & imaging results that were available during my care of the patient were reviewed by me and considered in my medical decision making (see chart for details).    Patient clinically concerning for progressive lumbar sciatica and possible cauda equina syndrome.  Symptoms may have been present for a while but have definitely gotten worse here lately patient now having difficulty standing and holding his own weight.  Had several near falls where he had a let himself down slowly.  Had a little bit of incontinence today.  No history of incontinence in the past.  No low back pain and no pain in the lower extremities.  Just the weakness.  Upper extremities are fine.  No headache.  No primary care doctor past medical history is none the patient really does not follow-up.  Plan was MRI here for cauda equina.  However patient was just a little too big for the MRI here.  Insured by folks here that patient will fit in the MRI at [facility].  Patient will be transferred to [facility] ED for MRI.  Due to patient's weakness may require admission but to what service will depend on the findings of the MRI.  Patient was premedicated with 1 mg of Ativan prior to the MRI here due to some claustrophobia.  May require re-dosing of that for the MRI at [facility].  Discussed with Dr. Erin Hearing at [facility].   Final Clinical Impressions(s) / ED Diagnoses   Final diagnoses:  Cauda equina syndrome [facility])    ED Discharge Orders    None       Vanetta Mulders, MD 03/21/17 1226    Vanetta Mulders, MD 03/21/17 1228

## 2017-03-21 NOTE — ED Notes (Signed)
Opyd, MD at bedside 

## 2017-03-21 NOTE — ED Triage Notes (Addendum)
Patient arrived via [facility] from home. Patient called out for a fall. Patient was sitting on floor against bed. Patient was getting out of chair and lowered himself down to the floor against bed. Denies pain. No LOC, no injuries. Patient says this happened again at 3:00am when he was walking and slipped and lowered himself down to the floor. Patient called EMS for lift assist and they were able to put him in the chair. Patient has had a hx of generalized weakness for the past 7 months but has never caused him to fall. Right leg feels more week. HX. Of Hypertension and DM. Patient states his doctor from [place] a while back thought the patient had sciatica and wanted patient to have surgery possibly. Patients Sciatica has gotten progressively worse.

## 2017-03-21 NOTE — Consult Note (Addendum)
Neurology Consultation Referring Physician: Dr. Hessie Diener  CC: Both leg weakness and right hand weakness  History is obtained from: Patient and chart review  HPI: Dylan [name] is a 52 y.o. male past medical history of hypertension, diabetes with history of mild weakness in his right leg prolonged for several years. However starting yesterday the patient felt both his legs became much weaker than the right leg being worse than the left leg. He also states that his right leg sensation is reduced. Denies urinary incontinence. He also has some weakness of his right hand which is new.  MRI L-spine was performed shows patchy signal abnormality in the spinal cord. He also severe lumbar spinal canal stenosis at L2-L3 and L3-L4. He also is heterogeneous marrow signal throughout his spine. MRI T-spine and C-spine were performed: Scattered T2 hyperintense lesions throughout the cervical andthoracic spinal cord as well as in the pons, medulla, and cerebellum. A lesion at C6-7 is enhancing.   Patient denies previous episodes of sensory symptoms, sudden onset weakness in the arm or leg except as those were his right leg would get weaker. Denies sudden onset loss of vision, dysarthria, gait imbalance. No family history of autoimmune diseases. She does admit to recent weight loss.   ROS: A 14 point ROS was performed and is negative except as noted in the HPI.   Past Medical History:  Diagnosis Date  . Diabetes mellitus without complication (HCC)   . Hypertension      Family History  Problem Relation Age of Onset  . Prostate cancer Father   Mother Had pancreatic cancer   Social History:  reports that  has never smoked. he has never used smokeless tobacco. He reports that he does not drink alcohol or use drugs. Patient is heterosexual, denies multiple sexual partners. No history of STDs  Exam: Current vital signs: BP 139/81   Pulse 81   Temp 99.3 F (37.4 C) (Oral)   Resp 14   Ht 6\' 4"  (1.93 m)    Wt (!) 158.8 kg (350 lb)   SpO2 98%   BMI 42.60 kg/m  Vital signs in last 24 hours: Temp:  [98.6 F (37 C)-99.3 F (37.4 C)] 99.3 F (37.4 C) (02/17 1427) Pulse Rate:  [75-98] 81 (02/17 2246) Resp:  [14-20] 14 (02/17 2246) BP: (106-163)/(70-98) 139/81 (02/17 2246) SpO2:  [95 %-100 %] 98 % (02/17 2246) Weight:  [158.8 kg (350 lb)] 158.8 kg (350 lb) (02/17 1713)   Physical Exam  Constitutional: Appears well-developed and well-nourished.  Psych: Affect appropriate to situation Eyes: No scleral injection HENT: No OP obstrucion Head: Normocephalic.  Cardiovascular: Normal rate and regular rhythm.  Respiratory: Effort normal, non-labored breathing GI: Soft.  No distension. There is no tenderness.  Skin: WDI  Neuro: Mental Status: Patient is awake, alert, oriented to person, place, month, year, and situation. Patient is able to give a clear and coherent history. No signs of aphasia or neglect Cranial Nerves: II: Visual Fields are full. Pupils are equal, round, and reactive to light.   III,IV, VI: EOMI without ptosis or diploplia.  V: Facial sensation is symmetric to temperature VII: Facial movement is symmetric.  VIII: hearing is intact to voice X: Uvula elevates symmetrically XI: Shoulder shrug is symmetric. XII: tongue is midline without atrophy or fasciculations.  Motor: Mental Status: Alert, oriented, thought content appropriate.  Speech fluent without evidence of aphasia.  Able to follow 3 step commands without difficulty. Cranial Nerves: II: visual fields grossly normal, pupils equal,  round, reactive to light and accommodation III,IV, VI: ptosis not present, extraocular muscles extra-ocular motions intact bilaterally V,VII: smile symmetric, facial light touch sensation normal bilaterally VIII: hearing normal bilaterally IX,X: gag reflex present XI: trapezius strength/neck flexion strength normal bilaterally XII: tongue strength normal  Motor: Right : Upper  extremity    Left:     Upper extremity 5/5 deltoid       5/5 deltoid 5/5 tricep      5/5 tricep 5/5 biceps      5/5 biceps  5/5wrist flexion     5/5 wrist flexion 4/5 wrist extension     5/5 wrist extension 4/5 hand grip      5/5 hand grip  Lower extremity     Lower extremity 3/5 hip flexor      5/5 hip flexor 4/5 hip adductors     5/5 hip adductors 3/5 hip abductors     5/5 hip abductors 3/5 quadricep      5/5 quadriceps  5/5 hamstrings     5/5 hamstrings 2/5 plantar flexion       4/5 plantar flexion 1/5 plantar extension     4/5 plantar extension Tone and bulk:normal tone throughout; no atrophy noted  Deep Tendon Reflexes:  Right: Upper Extremity   Left: Upper extremity   biceps (C-5 to C-6) 1/4   biceps (C-5 to C-6) 2/4 tricep (C7) 1/4    triceps (C7) 1/4 Brachioradialis (C6) 1/4  Brachioradialis (C6) 2/4  Lower Extremity Lower Extremity  quadriceps (L-2 to L-4) 2/4   quadriceps (L-2 to L-4) 2/4 Achilles (S1) 0/4   Achilles (S1) 1/4  Sensory: Reduced sensation over right leg  Plantars: Toes are downgoing bilaterally.  Cerebellar: FNF and HKS are intact bilaterall    I have reviewed labs in epic and the results pertinent to this consultation NFA:[id number] pending   I have reviewed the images obtained:MRI L spine, T spine and C spine  ASSESSMENT AND PLAN 52 y.o. male past medical history of hypertension, diabetes with history of mild weakness in his right leg prolonged for several years. However starting yesterday the patient felt both his legs became much weaker than the right leg being worse than the left leg.  Multiple small cortical lesions as well as lesions in the brainstem, some of which are enhancing.  Differentials is broad: Include relapsing remitting MS, NMO, malignancy, lymphoma( less likely), sarcoidosis. Less likely to be infectious myelitis given multiple lesions.  Plan IV methylprednisone 1g daily x5 days - would wait for HIV to return before starting   MRI Arlys John w.wo contrast Lumbar puncture: CSF protein, glucose, cell count, Gram stain, culture, oligoclonal bands, NMO antibody, IgG index, CSF Ace levels, VDRL, cytology. Flow cytometry. Freeze and hold,consider paraneoplastic panel.  Serum: RPR. HIV, ACE levels.  CT chest     Sushanth Aroor MD [facility] [phone]  If 7pm to 7am, please call on call as listed on AMION.

## 2017-03-21 NOTE — ED Triage Notes (Signed)
Pt sent here from [facility] to our MRI because it's larger.  Pt 350 lbs and 6\' 4" .  Been tx for sciatica and leg weakness to R leg.

## 2017-03-21 NOTE — ED Notes (Signed)
Aroor, MD at bedside. Aroor, MD confirmed pt may eat/drink.

## 2017-03-22 ENCOUNTER — Encounter (HOSPITAL_COMMUNITY): Payer: Self-pay | Admitting: General Practice

## 2017-03-22 ENCOUNTER — Other Ambulatory Visit: Payer: Self-pay

## 2017-03-22 ENCOUNTER — Inpatient Hospital Stay (HOSPITAL_COMMUNITY): Payer: Medicaid Other

## 2017-03-22 DIAGNOSIS — R531 Weakness: Secondary | ICD-10-CM | POA: Diagnosis present

## 2017-03-22 LAB — CSF CELL COUNT WITH DIFFERENTIAL
Eosinophils, CSF: 0 % (ref 0–1)
LYMPHS CSF: 90 % — AB (ref 40–80)
MONOCYTE-MACROPHAGE-SPINAL FLUID: 9 % — AB (ref 15–45)
Other Cells, CSF: 0
RBC COUNT CSF: 53 /mm3 — AB
Segmented Neutrophils-CSF: 1 % (ref 0–6)
TUBE #: 1
WBC, CSF: 11 /mm3 (ref 0–5)

## 2017-03-22 LAB — CBC WITH DIFFERENTIAL/PLATELET
BASOS ABS: 0 10*3/uL (ref 0.0–0.1)
BASOS PCT: 0 %
EOS ABS: 0.1 10*3/uL (ref 0.0–0.7)
Eosinophils Relative: 1 %
HEMATOCRIT: 38.6 % — AB (ref 39.0–52.0)
HEMOGLOBIN: 13.4 g/dL (ref 13.0–17.0)
Lymphocytes Relative: 30 %
Lymphs Abs: 2.3 10*3/uL (ref 0.7–4.0)
MCH: 30.2 pg (ref 26.0–34.0)
MCHC: 34.7 g/dL (ref 30.0–36.0)
MCV: 87.1 fL (ref 78.0–100.0)
MONOS PCT: 9 %
Monocytes Absolute: 0.6 10*3/uL (ref 0.1–1.0)
NEUTROS PCT: 60 %
Neutro Abs: 4.5 10*3/uL (ref 1.7–7.7)
Platelets: 238 10*3/uL (ref 150–400)
RBC: 4.43 MIL/uL (ref 4.22–5.81)
RDW: 12.8 % (ref 11.5–15.5)
WBC: 7.5 10*3/uL (ref 4.0–10.5)

## 2017-03-22 LAB — GLUCOSE, CAPILLARY
GLUCOSE-CAPILLARY: 209 mg/dL — AB (ref 65–99)
GLUCOSE-CAPILLARY: 320 mg/dL — AB (ref 65–99)

## 2017-03-22 LAB — BASIC METABOLIC PANEL
ANION GAP: 12 (ref 5–15)
BUN: 8 mg/dL (ref 6–20)
CALCIUM: 8.8 mg/dL — AB (ref 8.9–10.3)
CO2: 22 mmol/L (ref 22–32)
Chloride: 106 mmol/L (ref 101–111)
Creatinine, Ser: 1.22 mg/dL (ref 0.61–1.24)
Glucose, Bld: 252 mg/dL — ABNORMAL HIGH (ref 65–99)
Potassium: 3.6 mmol/L (ref 3.5–5.1)
SODIUM: 140 mmol/L (ref 135–145)

## 2017-03-22 LAB — GLUCOSE, CSF: GLUCOSE CSF: 107 mg/dL — AB (ref 40–70)

## 2017-03-22 LAB — HEMOGLOBIN A1C
HEMOGLOBIN A1C: 8.4 % — AB (ref 4.8–5.6)
Mean Plasma Glucose: 194.38 mg/dL

## 2017-03-22 LAB — RPR: RPR: NONREACTIVE

## 2017-03-22 LAB — CBG MONITORING, ED
GLUCOSE-CAPILLARY: 227 mg/dL — AB (ref 65–99)
GLUCOSE-CAPILLARY: 170 mg/dL — AB (ref 65–99)

## 2017-03-22 LAB — HIV ANTIBODY (ROUTINE TESTING W REFLEX): HIV Screen 4th Generation wRfx: NONREACTIVE

## 2017-03-22 LAB — PROTEIN, CSF: TOTAL PROTEIN, CSF: 65 mg/dL — AB (ref 15–45)

## 2017-03-22 MED ORDER — PANTOPRAZOLE SODIUM 40 MG PO TBEC
40.0000 mg | DELAYED_RELEASE_TABLET | Freq: Every day | ORAL | Status: DC
  Administered 2017-03-22 – 2017-04-09 (×19): 40 mg via ORAL
  Filled 2017-03-22 (×19): qty 1

## 2017-03-22 MED ORDER — LIDOCAINE HCL (PF) 1 % IJ SOLN
5.0000 mL | Freq: Once | INTRAMUSCULAR | Status: AC
  Administered 2017-03-22: 3 mL via INTRADERMAL

## 2017-03-22 MED ORDER — ENOXAPARIN SODIUM 80 MG/0.8ML ~~LOC~~ SOLN: 0.5000 mg/kg | Freq: Every day | SUBCUTANEOUS | Status: DC

## 2017-03-22 MED ORDER — HYDRALAZINE HCL 10 MG PO TABS
10.0000 mg | ORAL_TABLET | Freq: Three times a day (TID) | ORAL | Status: DC | PRN
  Administered 2017-03-23: 10 mg via ORAL
  Filled 2017-03-22: qty 1

## 2017-03-22 MED ORDER — SODIUM CHLORIDE 0.9 % IV SOLN
1000.0000 mg | Freq: Every day | INTRAVENOUS | Status: DC
  Administered 2017-03-23 – 2017-03-27 (×5): 1000 mg via INTRAVENOUS
  Filled 2017-03-22 (×5): qty 8

## 2017-03-22 MED ORDER — SODIUM CHLORIDE 0.9 % IV SOLN
1000.0000 mg | INTRAVENOUS | Status: DC
  Administered 2017-03-22: 1000 mg via INTRAVENOUS
  Filled 2017-03-22: qty 8

## 2017-03-22 NOTE — Progress Notes (Signed)
PROGRESS NOTE  Dylan [name] PJS:[id number] DOB: [birth year] DOA: 03/21/2017 PCP: Patient follows with an NP at his work  HPI/Recap of past 24 hours: Patient is a 52 year old male with past medical history of diabetes mellitus, hypertension and morbid obesity who started 7 months ago noted problems with right-sided weakness, his leg more than his arm. Initially it was mild, but over time had progressively gotten weaker and weaker. He did not see a doctor for this.  He noted that the more time that he spent on his feet, but weaker his legs would get. On 2/17, patient Could not stand up and rather than follow, he is himself to the ground and called paramedics. He then came into the emergency room.  In the emergency room, vital signs, lab work and chest x-ray were unremarkable. An MRI of the lumbar spine noted diffuse heterogeneous marrow signal abnormalities throughout the spine and pelvis and lower thoracic ribs concerning for multiple myeloma or metastatic disease as well as severe canal stenosis. Patient then underwent a cervical and thoracic MRI which noted diffusely abnormal bone marrow signals and scattered lesions throughout the cervical and thoracic cord as well as in the visualized pollens, medullary and cerebellum.  Neurology was consulted with a broad differential diagnosis including demyelinating disease or an abnormal oncologic process like myeloma. Hospitalist called for further evaluation and admission.  No events overnight. Patient feels about the same today. Denies any pain and other than weakness, and really has no other complaints.  Assessment/Plan: Principal Problem:   Acute right-sided and lower extremity weakness with findings of lesions in the CNS: Neurology on board. HIV titer pending. As above, differential diagnosis remains broad including metastatic disease, myeloma or demyelination process. MRI of the brain pending. Interventional radiology for lumbar puncture. Have started  IV steroids in the event that this is an autoimmune process. Active Problems:   Diabetes mellitus without complication ([place]): Holding metformin. Have started sliding scale. Checking his A1c.   Hypertension: Normally takes ACE inhibitor. Creatinine jumped up slightly from admission, med than secondary to contrast so holding lisinopril and added when necessary hydralazine  Acute kidney injury: Mild. Creatinine on admission of 0.88, at 1.2 this morning. Minimal second contrast. Continue to monitor. Holding ACE inhibitor.    Morbid obesity [facility]): Patient needs criteria with BMI greater than 35+ hypertension and diabetes mellitus   Code Status: None  Family Communication: Declined for me to call family   Disposition Plan: here for several days while workup continues, PT evaluates    Consultants:  Neurology  Interventional radiology   Procedures:  Planned lumbar puncture   Antimicrobials:  None   DVT prophylaxis:  Lovenox on hold until after lumbar puncture   Objective: Vitals:   03/22/17 0715 03/22/17 0730 03/22/17 0839 03/22/17 1200  BP: 125/67 124/68 (!) 146/75 (!) 138/32  Pulse: 67 63 70 72  Resp: 17 10 20 18   Temp:    98.3 F (36.8 C)  TempSrc:    Oral  SpO2: 97% 97% 100% 100%  Weight:   130.9 kg (288 lb 9.3 oz)   Height:   6' 4"  (1.93 m)     Intake/Output Summary (Last 24 hours) at 03/22/2017 1334 Last data filed at 03/22/2017 1200 Gross per 24 hour  Intake 240 ml  Output 300 ml  Net -60 ml   Filed Weights   03/21/17 1713 03/22/17 0839  Weight: (!) 158.8 kg (350 lb) 130.9 kg (288 lb 9.3 oz)   Body mass index  is 35.13 kg/m.  Exam:   General:  Alert and oriented 3, no acute distress  HEENT: Normocephalic management, mucous members are moist, cranial nerves II through XII are intact  Neck: Supple, no JVD, no carotid bruits   Cardiovascular: regular rate and rhythm, S1-S2, occasional ectopic beat   Respiratory: clear to auscultation bilaterally,  breathing not labored   Abdomen: soft, nontender, nondistended, positive bowel sounds   Musculoskeletal: no clubbing or cyanosis or edema   Skin: no skin breaks, tears or lesions  Psychiatry: appropriate, no evidence of psychoses  Neuro: Right side 5-/5  in terms of grip, flexion and extension slightly weaker than left 5/5 lower extremities note near symmetric, 5 minus/5   Data Reviewed: CBC: Recent Labs  Lab 03/21/17 0853 03/22/17 0152  WBC 8.2 7.5  NEUTROABS 6.0 4.5  HGB 13.9 13.4  HCT 39.5 38.6*  MCV 85.5 87.1  PLT 258 440   Basic Metabolic Panel: Recent Labs  Lab 03/21/17 0853 03/22/17 0152  NA 140 140  K 3.9 3.6  CL 106 106  CO2 25 22  GLUCOSE 179* 252*  BUN 9 8  CREATININE 0.84 1.22  CALCIUM 9.4 8.8*   GFR: Estimated Creatinine Clearance: 108.2 mL/min (by C-G formula based on SCr of 1.22 mg/dL). Liver Function Tests: Recent Labs  Lab 03/21/17 0853  AST 36  ALT 29  ALKPHOS 64  BILITOT 1.2  PROT 7.5  ALBUMIN 4.1   No results for input(s): LIPASE, AMYLASE in the last 168 hours. No results for input(s): AMMONIA in the last 168 hours. Coagulation Profile: No results for input(s): INR, PROTIME in the last 168 hours. Cardiac Enzymes: No results for input(s): CKTOTAL, CKMB, CKMBINDEX, TROPONINI in the last 168 hours. BNP (last 3 results) No results for input(s): PROBNP in the last 8760 hours. HbA1C: No results for input(s): HGBA1C in the last 72 hours. CBG: Recent Labs  Lab 03/22/17 0222 03/22/17 0657  GLUCAP 227* 170*   Lipid Profile: No results for input(s): CHOL, HDL, LDLCALC, TRIG, CHOLHDL, LDLDIRECT in the last 72 hours. Thyroid Function Tests: No results for input(s): TSH, T4TOTAL, FREET4, T3FREE, THYROIDAB in the last 72 hours. Anemia Panel: No results for input(s): VITAMINB12, FOLATE, FERRITIN, TIBC, IRON, RETICCTPCT in the last 72 hours. Urine analysis: No results found for: COLORURINE, APPEARANCEUR, LABSPEC, PHURINE, GLUCOSEU,  HGBUR, BILIRUBINUR, KETONESUR, PROTEINUR, UROBILINOGEN, NITRITE, LEUKOCYTESUR Sepsis Labs: '@LABRCNTIP'$ (procalcitonin:4,lacticidven:4)  )No results found for this or any previous visit (from the past 240 hour(s)).    Studies: Mr Lumbar Spine Wo Contrast  Result Date: 03/21/2017 CLINICAL DATA:  Increasing leg weakness.  Urinary incontinence. EXAM: MRI LUMBAR SPINE WITHOUT CONTRAST TECHNIQUE: Multiplanar, multisequence MR imaging of the lumbar spine was performed. No intravenous contrast was administered. COMPARISON:  None. FINDINGS: Segmentation:  Assumed standard. Alignment: Mild straightening of the lumbar lordosis. Sagittal alignment is maintained. Vertebrae: Diffusely heterogeneous marrow signal throughout the lumbar spine with multifocal areas of abnormal marrow replacement. Similar appearing marrow signal abnormality is seen in the visualized sacrum, bilateral iliac bones, and bilateral lower thoracic ribs. Conus medullaris and cauda equina: Conus extends to the L1-L2 level. Patchy abnormal signal within the visualized lower thoracic cord. Clumping of the proximal cauda equina nerve roots due to downstream stenosis. Paraspinal and other soft tissues: Negative. Disc levels: T10-T11: Right lateral and anterolateral disc osteophyte complex. No stenosis. T10-T11: Right lateral disc osteophyte complex.  No stenosis. T12-L1:  Negative. L1-L2:  Negative. L2-L3: Diffuse disc bulge with superimposed large central disc protrusion and mild  left greater than right facet arthropathy resulting in severe central spinal canal stenosis. No neuroforaminal stenosis. L3-L4: Diffuse disc bulge with superimposed central and left paracentral disc protrusion. Mild bilateral facet arthropathy. Severe central spinal canal stenosis and moderate left neuroforaminal stenosis. L4-L5: Diffuse disc bulge, slightly asymmetric to the left. Moderate bilateral facet arthropathy. Severe bilateral lateral recess stenosis. Moderate central  spinal canal stenosis. Mild to moderate bilateral neuroforaminal stenosis. L5-S1: Diffuse disc bulge with superimposed central disc protrusion. Mild narrowing of the bilateral lateral recesses. Disc material abuts the descending conjoined left S1 and S2 nerve roots. Mild left and severe right neuroforaminal stenosis. IMPRESSION: 1. Diffusely heterogeneous marrow signal throughout the visualized thoracolumbar spine, pelvis, and lower thoracic ribs, concerning for multiple myeloma or metastatic disease. 2. Patchy, abnormal signal throughout the visualized lower thoracic spinal cord, nonspecific. Recommend further evaluation with MRI of the cervical and thoracic spine with and without contrast. 3. Multilevel degenerative changes throughout the lumbar spine with severe central spinal canal stenosis at L2-L3 and L3-L4, likely accounting for the patient's symptoms. 4. Severe right neuroforaminal stenosis at L5-S1. Moderate left neuroforaminal stenosis at L3-L4. Electronically Signed   By: Titus Dubin M.D.   On: 03/21/2017 16:27   Mr Cervical Spine W Or Wo Contrast  Result Date: 03/21/2017 CLINICAL DATA:  Bilateral lower extremity weakness. Urinary incontinence. Abnormal distal spinal cord on lumbar spine MRI today. EXAM: MRI CERVICAL AND THORACIC SPINE WITHOUT AND WITH CONTRAST TECHNIQUE: Multiplanar and multiecho pulse sequences of the cervical spine, to include the craniocervical junction and cervicothoracic junction, and thoracic spine, were obtained without and with intravenous contrast. CONTRAST:  67m MULTIHANCE GADOBENATE DIMEGLUMINE 529 MG/ML IV SOLN COMPARISON:  Lumbar spine MRI today FINDINGS: MRI CERVICAL SPINE FINDINGS The study is moderately motion degraded. Alignment: Cervical spine straightening.  No significant listhesis. Vertebrae: Diffuse heterogeneous replacement of normal marrow signal throughout the spine as described on prior lumbar MRI. No dominant enhancing lesion. Preserved vertebral body  heights without evidence of fracture. Cord: There is abnormal T2 hyperintensity in the right lateral spinal cord at C3-4 extending over a length of approximately 1.5 cm. Smaller areas of abnormal T2 signal are present at the superior C6 endplate level and at the C6-7 disc space level, with the latter demonstrating enhancement. Posterior Fossa, vertebral arteries, paraspinal tissues: Discrete 4 mm T2 hyperintense nonenhancing lesion in the medulla. Additional T2 hyperintense lesions in the pons and left cerebellum. Disc levels: The cervical spinal canal is small in caliber diffusely on a congenital basis. C2-3: Negative. C3-4: Shallow central disc protrusion without significant stenosis. C4-5: Mild disc bulging and uncovertebral spurring result in mild spinal stenosis and moderate bilateral neural foraminal stenosis. C5-6: Broad-based posterior disc osteophyte complex results in moderate spinal stenosis with mild cord flattening and moderate to severe bilateral neural foraminal stenosis. C6-7: Broad-based posterior disc osteophyte complex and central disc extrusion result in severe spinal stenosis with moderate cord flattening and severe bilateral neural foraminal stenosis. C7-T1: Mild uncovertebral spurring results in at most mild bilateral neural foraminal stenosis. No spinal stenosis. MRI THORACIC SPINE FINDINGS The study is mildly motion degraded. Alignment: Normal. Vertebrae: Diffuse heterogeneous replacement of normal marrow signal throughout the spine and ribs as well as visualized clavicles and sternum. No dominant enhancing lesion. Preserved vertebral body heights without evidence of fracture. Cord: As seen on today's earlier lumbar MRI, there is patchy T2 hyperintensity in the distal thoracic spinal cord, with the most discrete lesion being on the left at T12. There is a  small T2 hyperintense focus in the left central cord at T6-7. A small lesion versus artifact is questioned at T3. There is the suggestion  of focal enhancement along the midline dorsal cord at T5 on axial images (series 21, image 21), however this is not confirmed on sagittal images. Paraspinal and other soft tissues: Unremarkable. Disc levels: Endplate spurring results in mild left neural foraminal stenosis at T1-2. A right posterolateral/foraminal disc osteophyte at T2-3 results in mild right neural foraminal stenosis. A shallow left paracentral disc osteophyte complex and left facet hypertrophy at T6-7 result in mild spinal stenosis and mild left neural foraminal stenosis. A slightly larger left paracentral disc osteophyte complex at T7-8 combines with facet hypertrophy to result in mild spinal stenosis. There is at most mild disc bulging more inferiorly in the thoracic spine without significant stenosis. IMPRESSION: 1. Scattered T2 hyperintense lesions throughout the cervical and thoracic spinal cord as well as in the pons, medulla, and cerebellum. A lesion at C6-7 is enhancing. While some lesions are associated with spinal stenosis and could in isolation reflect spondylotic myelopathy, the presence of lesions elsewhere in the cord and posterior fossa are indicative of a more widespread process such as demyelinating disease. Infection, inflammatory conditions such as sarcoidosis, and metastatic disease are additional considerations. Brain MRI and CSF analysis are recommended. 2. 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 3. Mild spinal stenosis at T6-7 and T7-8 due to disc and facet degeneration. 4. Diffusely abnormal bone marrow signal. Considerations include multiple myeloma, metastatic disease, and other infiltrative/myelofibrotic marrow processes. Electronically Signed   By: Logan Bores M.D.   On: 03/21/2017 21:44   Mr Thoracic Spine W Wo Contrast  Result Date: 03/21/2017 CLINICAL DATA:  Bilateral lower extremity weakness. Urinary incontinence. Abnormal distal spinal cord on lumbar  spine MRI today. EXAM: MRI CERVICAL AND THORACIC SPINE WITHOUT AND WITH CONTRAST TECHNIQUE: Multiplanar and multiecho pulse sequences of the cervical spine, to include the craniocervical junction and cervicothoracic junction, and thoracic spine, were obtained without and with intravenous contrast. CONTRAST:  63m MULTIHANCE GADOBENATE DIMEGLUMINE 529 MG/ML IV SOLN COMPARISON:  Lumbar spine MRI today FINDINGS: MRI CERVICAL SPINE FINDINGS The study is moderately motion degraded. Alignment: Cervical spine straightening.  No significant listhesis. Vertebrae: Diffuse heterogeneous replacement of normal marrow signal throughout the spine as described on prior lumbar MRI. No dominant enhancing lesion. Preserved vertebral body heights without evidence of fracture. Cord: There is abnormal T2 hyperintensity in the right lateral spinal cord at C3-4 extending over a length of approximately 1.5 cm. Smaller areas of abnormal T2 signal are present at the superior C6 endplate level and at the C6-7 disc space level, with the latter demonstrating enhancement. Posterior Fossa, vertebral arteries, paraspinal tissues: Discrete 4 mm T2 hyperintense nonenhancing lesion in the medulla. Additional T2 hyperintense lesions in the pons and left cerebellum. Disc levels: The cervical spinal canal is small in caliber diffusely on a congenital basis. C2-3: Negative. C3-4: Shallow central disc protrusion without significant stenosis. C4-5: Mild disc bulging and uncovertebral spurring result in mild spinal stenosis and moderate bilateral neural foraminal stenosis. C5-6: Broad-based posterior disc osteophyte complex results in moderate spinal stenosis with mild cord flattening and moderate to severe bilateral neural foraminal stenosis. C6-7: Broad-based posterior disc osteophyte complex and central disc extrusion result in severe spinal stenosis with moderate cord flattening and severe bilateral neural foraminal stenosis. C7-T1: Mild uncovertebral  spurring results in at most mild bilateral neural foraminal stenosis.  No spinal stenosis. MRI THORACIC SPINE FINDINGS The study is mildly motion degraded. Alignment: Normal. Vertebrae: Diffuse heterogeneous replacement of normal marrow signal throughout the spine and ribs as well as visualized clavicles and sternum. No dominant enhancing lesion. Preserved vertebral body heights without evidence of fracture. Cord: As seen on today's earlier lumbar MRI, there is patchy T2 hyperintensity in the distal thoracic spinal cord, with the most discrete lesion being on the left at T12. There is a small T2 hyperintense focus in the left central cord at T6-7. A small lesion versus artifact is questioned at T3. There is the suggestion of focal enhancement along the midline dorsal cord at T5 on axial images (series 21, image 21), however this is not confirmed on sagittal images. Paraspinal and other soft tissues: Unremarkable. Disc levels: Endplate spurring results in mild left neural foraminal stenosis at T1-2. A right posterolateral/foraminal disc osteophyte at T2-3 results in mild right neural foraminal stenosis. A shallow left paracentral disc osteophyte complex and left facet hypertrophy at T6-7 result in mild spinal stenosis and mild left neural foraminal stenosis. A slightly larger left paracentral disc osteophyte complex at T7-8 combines with facet hypertrophy to result in mild spinal stenosis. There is at most mild disc bulging more inferiorly in the thoracic spine without significant stenosis. IMPRESSION: 1. Scattered T2 hyperintense lesions throughout the cervical and thoracic spinal cord as well as in the pons, medulla, and cerebellum. A lesion at C6-7 is enhancing. While some lesions are associated with spinal stenosis and could in isolation reflect spondylotic myelopathy, the presence of lesions elsewhere in the cord and posterior fossa are indicative of a more widespread process such as demyelinating disease.  Infection, inflammatory conditions such as sarcoidosis, and metastatic disease are additional considerations. Brain MRI and CSF analysis are recommended. 2. 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 3. Mild spinal stenosis at T6-7 and T7-8 due to disc and facet degeneration. 4. Diffusely abnormal bone marrow signal. Considerations include multiple myeloma, metastatic disease, and other infiltrative/myelofibrotic marrow processes. Electronically Signed   By: Logan Bores M.D.   On: 03/21/2017 21:44    Scheduled Meds: . B-complex with vitamin C  1 tablet Oral Daily  . insulin aspart  0-5 Units Subcutaneous QHS  . insulin aspart  0-9 Units Subcutaneous TID WC  . LORazepam  1 mg Intravenous Once  . sodium chloride flush  3 mL Intravenous Q12H  . sodium chloride flush  3 mL Intravenous Q12H    Continuous Infusions: . sodium chloride    . methylPREDNISolone (SOLU-MEDROL) injection 1,000 mg (03/22/17 1328)     LOS: 1 day     Annita Brod, MD [facility]  To reach me or the doctor on call, go to: [email or web address] Password [email or web address]  03/22/2017, 1:34 PM

## 2017-03-22 NOTE — ED Notes (Signed)
Attempted report x1. 

## 2017-03-22 NOTE — Procedures (Signed)
CLINICAL DATA: Lower extremity weakness.  Spinal cord lesions on MRI.  Severe multilevel lumbar stenosis.  EXAM:  DIAGNOSTIC LUMBAR PUNCTURE UNDER FLUOROSCOPIC GUIDANCE  FLUOROSCOPY TIME: Fluoroscopy Time:  0 minutes, 42 seconds  Radiation Exposure Index (if provided by the fluoroscopic device):  8.9 mGy  Number of Acquired Spot Images: 0  PROCEDURE:  I discussed the risks (including hemorrhage, infection, headache, and nerve damage, among others), benefits, and alternatives to fluoroscopically guided lumbar puncture with the patient.  We specifically discussed the high technical likelihood of success of the procedure. The patient understood and elected to undergo the procedure.     Prior to the procedure I reviewed of the patient's lumbar spine MRI and determined that L5-S1 was the optimum level.  Standard time-out was employed.  Following sterile skin prep and local anesthetic administration consisting of 1 percent lidocaine, a 20 gauge spinal needle was advanced without difficulty into the thecal sac at the at the L5-S1 level from a slightly paramedian approach. Clear CSF was returned.  Opening pressure was not obtained.   15 cc of clear CSF was collected.  The needle was subsequently removed and the skin cleansed and bandaged.  No immediate complications were observed.    IMPRESSION:  Technically successful fluoroscopically lumbar puncture at the L5-S1 level yielding 15 cc of clear CSF.

## 2017-03-22 NOTE — Progress Notes (Signed)
Dr. Rito Ehrlich notified that radiology will not do LP until it has been attempted at bedside.

## 2017-03-22 NOTE — Progress Notes (Signed)
CRITICAL VALUE ALERT  Critical Value: CF WBC's 11.0  Date & Time Notied:  03/22/17 1819  Provider Notified: Dr. Rito Ehrlich  Orders Received/Actions taken: Text paged MD

## 2017-03-22 NOTE — Progress Notes (Signed)
Patient arrived to unit in NAD. VS stable. Patient oriented to unit and call bell in reach.

## 2017-03-22 NOTE — Progress Notes (Signed)
Patient arrived back to room in NAD. VS stable and LP site clean, dry and intact.

## 2017-03-22 NOTE — Plan of Care (Signed)
  Progressing Education: Knowledge of General Education information will improve 03/22/2017 2355 - Progressing by Earnest Rosier, RN Clinical Measurements: Ability to maintain clinical measurements within normal limits will improve 03/22/2017 2355 - Progressing by Earnest Rosier, RN Will remain free from infection 03/22/2017 2355 - Progressing by Earnest Rosier, RN Diagnostic test results will improve 03/22/2017 2355 - Progressing by Earnest Rosier, RN

## 2017-03-22 NOTE — Care Management Note (Signed)
Case Management Note  Patient Details  Name: Dylan [name] MRN: [id number] Date of Birth: [birth year]  Subjective/Objective:    Pt admitted with spinal cord lesion. He is from home.       PCP: none Insurance: none         Action/Plan: Awaiting PT/OT evals. CM following for d/c needs, physician orders.  Expected Discharge Date:                  Expected Discharge Plan:     In-House Referral:     Discharge planning Services     Post Acute Care Choice:    Choice offered to:     DME Arranged:    DME Agency:     HH Arranged:    HH Agency:     Status of Service:  In process, will continue to follow  If discussed at Long Length of Stay Meetings, dates discussed:    Additional Comments:  Kermit Balo, RN 03/22/2017, 12:25 PM

## 2017-03-22 NOTE — Progress Notes (Signed)
Patient off floor to IR for LP

## 2017-03-22 NOTE — Progress Notes (Signed)
Subjective: No significant complaints today  Exam: Vitals:   03/22/17 0839 03/22/17 1200  BP: (!) 146/75 (!) 138/32  Pulse: 70 72  Resp: 20 18  Temp:  98.3 F (36.8 C)  SpO2: 100% 100%    Physical Exam   HEENT-  Normocephalic, no lesions, without obvious abnormality.  Normal external eye and conjunctiva.   Cardiovascular- S1-S2 audible, pulses palpable throughout   Lungs-no rhonchi or wheezing noted, no excessive working breathing.  Saturations within normal limits Abdomen- All 4 quadrants palpated and nontender Extremities- Warm, dry and intact--bilateral fingers held in a flexion style secondary to weakness Musculoskeletal-no joint tenderness, deformity or swelling Skin-warm and dry, no hyperpigmentation, vitiligo, or suspicious lesions    Neuro:  Mental Status: Alert, oriented, thought content appropriate.  Speech fluent without evidence of aphasia.  Able to follow 3 step commands without difficulty. Cranial Nerves: II:  Visual fields grossly normal,  III,IV, VI: ptosis not present, extra-ocular motions intact bilaterally pupils equal, round, reactive to light and accommodation V,VII: smile symmetric, facial light touch sensation normal bilaterally VIII: hearing normal bilaterally IX,X: uvula rises symmetrically XI: bilateral shoulder shrug XII: midline tongue extension Motor: Right :  Upper extremity                                              Left:     Upper extremity 5/5 deltoid                                                                   5/5 deltoid 5/5 tricep                                                                     5/5 tricep 5/5 biceps                                                                    5/5 biceps  5/5wrist flexion                                                            5/5 wrist flexion 4/5 wrist extension                                                      4/5 wrist extension 3/5 hand grip  3/5 hand grip --It is noted that bilaterally with right greater than left, his fingers are held in flexion secondary to weakness with extension.             Lower extremity                                                          Lower extremity 4/5 hip flexor                                                               5/5 hip flexor 5/5 hip adductors                                                        5/5 hip adductors 5/5 hip abductors                                                        5/5 hip abductors 4/5 quadricep                                                              5/5 quadriceps  5/5 hamstrings                                                            5/5 hamstrings 3/5 plantar flexion                                                       4/5 plantar flexion 1/5 plantar extension                                                  4/5 plantar extension Tone and bulk:normal tone throughout; no atrophy noted   Sensory: Pinprick and light touch stated to be intact throughout, bilaterally Deep Tendon Reflexes: 2+ and symmetric throughout--positive for cross abductors in the legs Plantars: Right: downgoing   Left: downgoing Cerebellar: normal finger-to-nose,  and normal heel-to-shin test     Medications:  Scheduled: . B-complex with vitamin C  1 tablet Oral Daily  . enoxaparin (  LOVENOX) injection  0.5 mg/kg Subcutaneous Daily  . insulin aspart  0-5 Units Subcutaneous QHS  . insulin aspart  0-9 Units Subcutaneous TID WC  . lisinopril  10 mg Oral Daily  . LORazepam  1 mg Intravenous Once  . sodium chloride flush  3 mL Intravenous Q12H  . sodium chloride flush  3 mL Intravenous Q12H    Pertinent Labs/Diagnostics: MRI brain with and without pending Lumbar puncture by fluoroscopy secondary to body habitus pending Glucose 170    Dg Chest 2 View  Result Date: 03/21/2017 CLINICAL DATA:  Right-sided weakness. EXAM: CHEST  2 VIEW COMPARISON:   None. FINDINGS: AP and lateral views of the chest were obtained. The lungs are clear without focal pneumonia, edema, pneumothorax or pleural effusion. Minimal atelectasis noted left base. The cardiopericardial silhouette is within normal limits for size. The visualized bony structures of the thorax are intact. Telemetry leads overlie the chest. IMPRESSION: No active cardiopulmonary disease. Electronically Signed   By: Misty Stanley M.D.   On: 03/21/2017 13:08   Mr Lumbar Spine Wo Contrast  Result Date: 03/21/2017 CLINICAL DATA:  Increasing leg weakness.  Urinary incontinence. EXAM: MRI LUMBAR SPINE WITHOUT CONTRAST TECHNIQUE:. IMPRESSION: 1. Diffusely heterogeneous marrow signal throughout the visualized thoracolumbar spine, pelvis, and lower thoracic ribs, concerning for multiple myeloma or metastatic disease. 2. Patchy, abnormal signal throughout the visualized lower thoracic spinal cord, nonspecific. Recommend further evaluation with MRI of the cervical and thoracic spine with and without contrast. 3. Multilevel degenerative changes throughout the lumbar spine with severe central spinal canal stenosis at L2-L3 and L3-L4, likely accounting for the patient's symptoms. 4. Severe right neuroforaminal stenosis at L5-S1. Moderate left neuroforaminal stenosis at L3-L4. Electronically Signed   By: Titus Dubin M.D.   On: 03/21/2017 16:27        Mr Thoracic Spine W Wo Contrast   Mr Cervical Spine W Or Wo Contrast  Result Date: 03/21/2017 CLINICAL DATA:  Bilateral lower extremity weakness. Urinary incontinence. Abnormal distal spinal cord on lumbar spine MRI today. EXAM: MRI CERVICAL AND THORACIC SPINE WITHOUT AND WITH CONTRAST TECHNIQUE: Multiplanar and multiecho pulse sequences of the cervical spine, to include the craniocervical junction and cervicothoracic junction, and thoracic spine, were obtained without and with intravenous contrast. CONTRAST:  11m MULTIHANCE GADOBENATE DIMEGLUMINE 529 MG/ML IV  SOLN COMPARISON:  Lumbar spine MRI today FINDINGS: MRI CERVICAL SPINE FINDINGS The study is moderately motion degraded. Alignment: Cervical spine straightening.  No significant listhesis. Vertebrae: Diffuse heterogeneous replacement of normal marrow signal throughout the spine as described on prior lumbar MRI. No dominant enhancing lesion. Preserved vertebral body heights without evidence of fracture. Cord: There is abnormal T2 hyperintensity in the right lateral spinal cord at C3-4 extending over a length of approximately 1.5 cm. Smaller areas of abnormal T2 signal are present at the superior C6 endplate level and at the C6-7 disc space level, with the latter demonstrating enhancement. Posterior Fossa, vertebral arteries, paraspinal tissues: Discrete 4 mm T2 hyperintense nonenhancing lesion in the medulla. Additional T2 hyperintense lesions in the pons and left cerebellum. Disc levels: The cervical spinal canal is small in caliber diffusely on a congenital basis. C2-3: Negative. C3-4: Shallow central disc protrusion without significant stenosis. C4-5: Mild disc bulging and uncovertebral spurring result in mild spinal stenosis and moderate bilateral neural foraminal stenosis. C5-6: Broad-based posterior disc osteophyte complex results in moderate spinal stenosis with mild cord flattening and moderate to severe bilateral neural foraminal stenosis. C6-7: Broad-based posterior disc osteophyte complex and  central disc extrusion result in severe spinal stenosis with moderate cord flattening and severe bilateral neural foraminal stenosis. C7-T1: Mild uncovertebral spurring results in at most mild bilateral neural foraminal stenosis. No spinal stenosis.   MRI THORACIC SPINE FINDINGS . IMPRESSION: 1. Scattered T2 hyperintense lesions throughout the cervical and thoracic spinal cord as well as in the pons, medulla, and cerebellum. A lesion at C6-7 is enhancing. While some lesions are associated with spinal stenosis and  could in isolation reflect spondylotic myelopathy, the presence of lesions elsewhere in the cord and posterior fossa are indicative of a more widespread process such as demyelinating disease. Infection, inflammatory conditions such as sarcoidosis, and metastatic disease are additional considerations. Brain MRI and CSF analysis are recommended. 2. 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 3. Mild spinal stenosis at T6-7 and T7-8 due to disc and facet degeneration. 4. Diffusely abnormal bone marrow signal. Considerations include multiple myeloma, metastatic disease, and other infiltrative/myelofibrotic marrow processes. Electronically Signed   By: Logan Bores M.D.   On: 03/21/2017 21:44     Etta Quill PA-C [facility] [phone]  Impression: Normal with history of bilateral weakness in legs.  Weakness has progressed which brought him to the hospital.  MRI of cervical spine, thoracic spine, lumbar spine obtained showing multiple lesions throughout along with significant cord stenosis at C5-6 with no edema noted.  Etiology unclear with a differential that includes MS, malignancy, sarcoidosis, myelitis, less likely NMO.  Today's exam he appears to be stronger in his lower extremities however bilateral finger extension, flexion, abduction and abduction remain extremely weak.  Patient thus far has received 1/5 doses of Solu-Medrol.   Plan: --Obtain MRI brain with and without contrast --Obtain lumbar puncture to look for cytology and basic labs (CSF protein, glucose, cell count, --Gram stain, culture, oligoclonal bands, NMO antibody, IgG index, CSF ACE levels, --VLDL, cytology, flow cytometry.  Please freeze and hold for consideration of paraneoplastic.  ) --Serum RPR, HIV, ACE --Physical therapy  We will continue to follow with you  03/22/2017, 1:19 PM  ATTENDING ADDENDUM D/w Theodosia Paling, PA-C. Agree with above. We will  follow.  -- Amie Portland, MD [facility] Pager: [phone] If 7pm to 7am, please call on call as listed on [facility]ION.

## 2017-03-23 ENCOUNTER — Inpatient Hospital Stay (HOSPITAL_COMMUNITY): Payer: Medicaid Other

## 2017-03-23 DIAGNOSIS — M4802 Spinal stenosis, cervical region: Secondary | ICD-10-CM

## 2017-03-23 DIAGNOSIS — M4800 Spinal stenosis, site unspecified: Secondary | ICD-10-CM | POA: Diagnosis present

## 2017-03-23 DIAGNOSIS — S14136S Anterior cord syndrome at C6 level of cervical spinal cord, sequela: Secondary | ICD-10-CM

## 2017-03-23 DIAGNOSIS — E1165 Type 2 diabetes mellitus with hyperglycemia: Secondary | ICD-10-CM

## 2017-03-23 LAB — BASIC METABOLIC PANEL
ANION GAP: 12 (ref 5–15)
BUN: 11 mg/dL (ref 6–20)
CALCIUM: 9.2 mg/dL (ref 8.9–10.3)
CO2: 21 mmol/L — ABNORMAL LOW (ref 22–32)
Chloride: 105 mmol/L (ref 101–111)
Creatinine, Ser: 0.9 mg/dL (ref 0.61–1.24)
GFR calc Af Amer: >60 mL/min (ref >60)
GFR calc non Af Amer: >60 mL/min (ref >60)
Glucose, Bld: 283 mg/dL — ABNORMAL HIGH (ref 65–99)
POTASSIUM: 4.2 mmol/L (ref 3.5–5.1)
Sodium: 138 mmol/L (ref 135–145)

## 2017-03-23 LAB — IGG CSF INDEX
ALBUMIN CSF-MCNC: 54 mg/dL — AB (ref 11–48)
Albumin: 4.1 g/dL (ref 3.5–5.5)
IGG CSF: 17.4 mg/dL — AB (ref 0.0–8.6)
IGG INDEX CSF: 1 — AB (ref 0.0–0.7)
IgG (Immunoglobin G), Serum: 1336 mg/dL (ref 700–1600)
IgG/Alb Ratio, CSF: 0.32 — ABNORMAL HIGH (ref 0.00–0.25)

## 2017-03-23 LAB — GLUCOSE, CAPILLARY
GLUCOSE-CAPILLARY: 168 mg/dL — AB (ref 65–99)
GLUCOSE-CAPILLARY: 340 mg/dL — AB (ref 65–99)
Glucose-Capillary: 307 mg/dL — ABNORMAL HIGH (ref 65–99)
Glucose-Capillary: 313 mg/dL — ABNORMAL HIGH (ref 65–99)
Glucose-Capillary: 344 mg/dL — ABNORMAL HIGH (ref 65–99)

## 2017-03-23 LAB — ANGIOTENSIN CONVERTING ENZYME, CSF: ANGIO CONVERT ENZYME: 0 U/L (ref 0.0–2.8)

## 2017-03-23 LAB — VDRL, CSF: VDRL Quant, CSF: NONREACTIVE

## 2017-03-23 LAB — ANGIOTENSIN CONVERTING ENZYME: ANGIOTENSIN-CONVERTING ENZYME: 21 U/L (ref 14–82)

## 2017-03-23 MED ORDER — INSULIN GLARGINE 100 UNIT/ML ~~LOC~~ SOLN
5.0000 [IU] | Freq: Every day | SUBCUTANEOUS | Status: DC
  Administered 2017-03-23: 5 [IU] via SUBCUTANEOUS
  Filled 2017-03-23: qty 0.05

## 2017-03-23 MED ORDER — LISINOPRIL 10 MG PO TABS
10.0000 mg | ORAL_TABLET | Freq: Every day | ORAL | Status: DC
  Administered 2017-03-23 – 2017-03-31 (×9): 10 mg via ORAL
  Filled 2017-03-23 (×10): qty 1

## 2017-03-23 MED ORDER — INSULIN GLARGINE 100 UNIT/ML ~~LOC~~ SOLN
5.0000 [IU] | SUBCUTANEOUS | Status: AC
  Administered 2017-03-23: 5 [IU] via SUBCUTANEOUS
  Filled 2017-03-23: qty 0.05

## 2017-03-23 MED ORDER — GADOBENATE DIMEGLUMINE 529 MG/ML IV SOLN
20.0000 mL | Freq: Once | INTRAVENOUS | Status: AC | PRN
  Administered 2017-03-23: 20 mL via INTRAVENOUS

## 2017-03-23 NOTE — Consult Note (Signed)
Physical Medicine and Rehabilitation Consult Reason for Consult: Decreased functional mobility Referring Physician: [facility]   HPI: Dylan [name] is a 52 y.o. right handed male with history of hypertension, diabetes mellitus, chronic back pain with sciatica had refused surgical intervention in the past. Per chart review patient lives alone. 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 One level home with one step to entry. Presented to 17 2019 with generalized weakness specifically right lower extremity that have progressed over the past 7 months. CT lumbar, cervical, thoracic spine showed scattered T2 hyperintense lesions throughout cervical, thoracic and lumbar spine. Severe spinal stenosis at C6-7 and moderate stenosis at C5-6 as well as cord compression. Lumbar MRI diffuse degenerative changes significant for severe central spinal canal stenosis L2-4, foraminal stenosis at L5-S1 as well as L3-4. Neurosurgery consulted await plan for possible need for surgical intervention. Neurology also consulted following for progressive weakness and workup ongoing. Lumbar puncture has been completed presently on IV steroids. MRI of the brain is pending. Physical and occupational therapy evaluations completed with recommendations of physical medicine rehabilitation consult.   Review of Systems  Constitutional: Positive for malaise/fatigue. Negative for fever.  HENT: Negative for hearing loss.   Eyes: Negative for blurred vision and double vision.  Respiratory: Negative for cough and shortness of breath.   Cardiovascular: Negative for chest pain and leg swelling.  Gastrointestinal: Positive for constipation. Negative for nausea and vomiting.  Genitourinary: Negative for hematuria.       Urinary incontinence  Skin: Negative for rash.  Neurological: Positive for sensory change and weakness.  All other systems reviewed and are negative.  Past Medical History:  Diagnosis Date  .  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 Diabetes mellitus without complication ([place])   . Hypertension    Past Surgical History:  Procedure Laterality Date  . NOSE SURGERY     AS A CHILD   Family History  Problem Relation Age of Onset  . Prostate cancer Father    Social History:  reports that  has never smoked. he has never used smokeless tobacco. He reports that he does not drink alcohol or use drugs. Allergies: No Known Allergies Medications Prior to Admission  Medication Sig Dispense Refill  . b complex vitamins tablet Take 1 tablet by mouth daily.    Marland Kitchen lisinopril (PRINIVIL,ZESTRIL) 10 MG tablet Take 10 mg by mouth daily.    . metFORMIN (GLUCOPHAGE) 500 MG tablet Take 500 mg by mouth 2 (two) times daily with a meal.      Home: Home Living Family/patient expects to be discharged to:: Private residence Living Arrangements: Alone Available Help at Discharge: Neighbor, Available PRN/intermittently Type of Home: Apartment Home Access: Stairs to enter [employer] of Steps: 1 Entrance Stairs-Rails: None Home Layout: One level Bathroom Shower/Tub: Chiropodist: Handicapped height Home Equipment: Environmental consultant - 2 wheels, Cane - single point  Functional History: Prior Function Level of Independence: Independent Comments: Geophysical data processor; Drives.  Functional Status:  Mobility: Bed Mobility Overal bed mobility: Needs Assistance Bed Mobility: Rolling, Sidelying to Sit Rolling: Min guard Sidelying to sit: Min guard, HOB elevated General bed mobility comments: Able to get to EOB with increased effort and use of rail. Transfers Overall transfer level: Needs assistance Equipment used: Rolling walker (2 wheeled) Transfers: Sit to/from Stand Sit to Stand: Mod assist, +2 physical assistance, From elevated surface General transfer comment: Assist of 2 to power to standing with cues for hand placement/technique. Assist with  knee/hip extension and cues for WB through  BUEs. Ambulation/Gait Ambulation/Gait assistance: Mod assist, +2 physical assistance Ambulation Distance (Feet): 3 Feet Assistive device: Rolling walker (2 wheeled) Gait Pattern/deviations: Step-to pattern, Narrow base of support, Trunk flexed, Decreased dorsiflexion - right, Decreased step length - left, Decreased step length - right General Gait Details: Pt with difficulty sequencing/placing BLEs during stepping, narrow BoS and increased WB through BUEs. Decreased proprioception BLEs, assist with managing right knee due to instability. Difficulty manuevering RW, needed assist with management.  Gait velocity: decreased Gait velocity interpretation: Below normal speed for age/gender    ADL: ADL Overall ADL's : Needs assistance/impaired Eating/Feeding: Modified independent, Sitting Grooming: Set up, Sitting Upper Body Bathing: Minimal assistance, Sitting Lower Body Bathing: Moderate assistance, +2 for physical assistance, +2 for safety/equipment, Sit to/from stand Upper Body Dressing : Minimal assistance, Sitting Lower Body Dressing: Moderate assistance, +2 for physical assistance, +2 for safety/equipment, Sit to/from stand Lower Body Dressing Details (indicate cue type and reason): pt able to don socks seated EOB using figure 4 technique, requires increased time/effort to complete due to decreased [facility]  Toilet Transfer: Moderate assistance, +2 for physical assistance, +2 for safety/equipment, Stand-pivot, BSC, RW Toilet Transfer Details (indicate cue type and reason): simulated in transfer to [place] and Hygiene: Moderate assistance, +2 for physical assistance, +2 for safety/equipment, Sit to/from stand Functional mobility during ADLs: Moderate assistance, +2 for physical assistance, +2 for safety/equipment, Rolling walker  Cognition: Cognition Overall Cognitive Status: Within Functional Limits for tasks assessed Orientation Level: Oriented  X4 Cognition Arousal/Alertness: Awake/alert Behavior During Therapy: WFL for tasks assessed/performed Overall Cognitive Status: Within Functional Limits for tasks assessed  Blood pressure 123/65, pulse 95, temperature 99 F (37.2 C), temperature source Oral, resp. rate 20, height '6\' 4"'$  (1.93 m), weight 130.9 kg (288 lb 9.3 oz), SpO2 99 %. Physical Exam  Vitals reviewed. Constitutional: He is oriented to person, place, and time.  52 year old right-handed obese male  HENT:  Head: Normocephalic.  Eyes: EOM are normal. Right eye exhibits no discharge. Left eye exhibits no discharge.  Neck: Normal range of motion. Neck supple. No thyromegaly present.  Cardiovascular: Normal rate, regular rhythm and normal heart sounds.  Respiratory: Effort normal and breath sounds normal. No respiratory distress.  GI: Soft. Bowel sounds are normal. He exhibits no distension.  Neurological: He is alert and oriented to person, place, and time.  UE 4/5 deltoid, biceps, wrist. HI 2 to 2+/5. RLE: 2+ to 3/5 HF, KE and 2+ADF, 3/5 APF. LLE: 3- to 3/5 HF, KE and ADF/PF. Senses pain and light touch. In all 4's. DTR's 1+  Skin: Skin is warm and dry.    Results for orders placed or performed during the hospital encounter of 03/21/17 (from the past 24 hour(s))  Hemoglobin A1c     Status: Abnormal   Collection Time: 03/22/17  1:55 PM  Result Value Ref Range   Hgb A1c MFr Bld 8.4 (H) 4.8 - 5.6 %   Mean Plasma Glucose 194.38 mg/dL  Glucose, CSF     Status: Abnormal   Collection Time: 03/22/17  4:18 PM  Result Value Ref Range   Glucose, CSF 107 (H) 40 - 70 mg/dL  Protein, CSF     Status: Abnormal   Collection Time: 03/22/17  4:18 PM  Result Value Ref Range   Total  Protein, CSF 65 (H) 15 - 45 mg/dL  VDRL, CSF     Status: None   Collection Time: 03/22/17  4:18  PM  Result Value Ref Range   VDRL Quant, CSF Non Reactive Non Rea:<1:1  CSF cell count with differential     Status: Abnormal   Collection Time: 03/22/17   4:18 PM  Result Value Ref Range   Tube # 1    Color, CSF COLORLESS COLORLESS   Appearance, CSF CLEAR CLEAR   Supernatant NOT INDICATED    RBC Count, CSF 53 (H) 0 /cu mm   WBC, CSF 11 (HH) 0 - 5 /cu mm   Segmented Neutrophils-CSF 1 0 - 6 %   Lymphs, CSF 90 (H) 40 - 80 %   Monocyte-Macrophage-Spinal Fluid 9 (L) 15 - 45 %   Eosinophils, CSF 0 0 - 1 %   Other Cells, CSF 0   IgG CSF index     Status: Abnormal   Collection Time: 03/22/17  4:18 PM  Result Value Ref Range   IgG, CSF 17.4 (H) 0.0 - 8.6 mg/dL   Albumin CSF-mCnc 54 (H) 11 - 48 mg/dL   IgG (Immunoglobin G), Serum 1,336 700 - 1,600 mg/dL   Albumin 4.1 3.5 - 5.5 g/dL   IgG/Alb Ratio, CSF 0.32 (H) 0.00 - 0.25   CSF IgG Index 1.0 (H) 0.0 - 0.7  Glucose, capillary     Status: Abnormal   Collection Time: 03/22/17  4:26 PM  Result Value Ref Range   Glucose-Capillary 209 (H) 65 - 99 mg/dL  Anaerobic culture     Status: None (Preliminary result)   Collection Time: 03/22/17  4:30 PM  Result Value Ref Range   Specimen Description CSF    Special Requests NONE    Culture      NO GROWTH < 24 HOURS Performed at [facility], [address], [place], [place] [address]    Report Status PENDING   CSF culture     Status: None (Preliminary result)   Collection Time: 03/22/17  4:30 PM  Result Value Ref Range   Specimen Description CSF    Special Requests NONE    Gram Stain      WBC PRESENT, PREDOMINANTLY MONONUCLEAR NO ORGANISMS SEEN CYTOSPIN SMEAR    Culture      NO GROWTH < 24 HOURS Performed at [facility] [address] [place], [place] [address]    Report Status PENDING   Glucose, capillary     Status: Abnormal   Collection Time: 03/22/17  9:38 PM  Result Value Ref Range   Glucose-Capillary 320 (H) 65 - 99 mg/dL  Basic metabolic panel     Status: Abnormal   Collection Time: 03/23/17  4:27 AM  Result Value Ref Range   Sodium 138 135 - 145 mmol/L   Potassium 4.2 3.5 - 5.1 mmol/L   Chloride 105 101 - 111  mmol/L   CO2 21 (L) 22 - 32 mmol/L   Glucose, Bld 283 (H) 65 - 99 mg/dL   BUN 11 6 - 20 mg/dL   Creatinine, Ser 0.90 0.61 - 1.24 mg/dL   Calcium 9.2 8.9 - 10.3 mg/dL   GFR calc non Af Amer >60 >60 mL/min   GFR calc Af Amer >60 >60 mL/min   Anion gap 12 5 - 15  Glucose, capillary     Status: Abnormal   Collection Time: 03/23/17  6:21 AM  Result Value Ref Range   Glucose-Capillary 307 (H) 65 - 99 mg/dL  Glucose, capillary     Status: Abnormal   Collection Time: 03/23/17 11:35 AM  Result  Value Ref Range   Glucose-Capillary 313 (H) 65 - 99 mg/dL   Mr Lumbar Spine Wo Contrast  Result Date: 03/21/2017 CLINICAL DATA:  Increasing leg weakness.  Urinary incontinence. EXAM: MRI LUMBAR SPINE WITHOUT CONTRAST TECHNIQUE: Multiplanar, multisequence MR imaging of the lumbar spine was performed. No intravenous contrast was administered. COMPARISON:  None. FINDINGS: Segmentation:  Assumed standard. Alignment: Mild straightening of the lumbar lordosis. Sagittal alignment is maintained. Vertebrae: Diffusely heterogeneous marrow signal throughout the lumbar spine with multifocal areas of abnormal marrow replacement. Similar appearing marrow signal abnormality is seen in the visualized sacrum, bilateral iliac bones, and bilateral lower thoracic ribs. Conus medullaris and cauda equina: Conus extends to the L1-L2 level. Patchy abnormal signal within the visualized lower thoracic cord. Clumping of the proximal cauda equina nerve roots due to downstream stenosis. Paraspinal and other soft tissues: Negative. Disc levels: T10-T11: Right lateral and anterolateral disc osteophyte complex. No stenosis. T10-T11: Right lateral disc osteophyte complex.  No stenosis. T12-L1:  Negative. L1-L2:  Negative. L2-L3: Diffuse disc bulge with superimposed large central disc protrusion and mild left greater than right facet arthropathy resulting in severe central spinal canal stenosis. No neuroforaminal stenosis. L3-L4: Diffuse disc bulge  with superimposed central and left paracentral disc protrusion. Mild bilateral facet arthropathy. Severe central spinal canal stenosis and moderate left neuroforaminal stenosis. L4-L5: Diffuse disc bulge, slightly asymmetric to the left. Moderate bilateral facet arthropathy. Severe bilateral lateral recess stenosis. Moderate central spinal canal stenosis. Mild to moderate bilateral neuroforaminal stenosis. L5-S1: Diffuse disc bulge with superimposed central disc protrusion. Mild narrowing of the bilateral lateral recesses. Disc material abuts the descending conjoined left S1 and S2 nerve roots. Mild left and severe right neuroforaminal stenosis. IMPRESSION: 1. Diffusely heterogeneous marrow signal throughout the visualized thoracolumbar spine, pelvis, and lower thoracic ribs, concerning for multiple myeloma or metastatic disease. 2. Patchy, abnormal signal throughout the visualized lower thoracic spinal cord, nonspecific. Recommend further evaluation with MRI of the cervical and thoracic spine with and without contrast. 3. Multilevel degenerative changes throughout the lumbar spine with severe central spinal canal stenosis at L2-L3 and L3-L4, likely accounting for the patient's symptoms. 4. Severe right neuroforaminal stenosis at L5-S1. Moderate left neuroforaminal stenosis at L3-L4. Electronically Signed   By: Titus Dubin M.D.   On: 03/21/2017 16:27   Mr Cervical Spine W Or Wo Contrast  Result Date: 03/21/2017 CLINICAL DATA:  Bilateral lower extremity weakness. Urinary incontinence. Abnormal distal spinal cord on lumbar spine MRI today. EXAM: MRI CERVICAL AND THORACIC SPINE WITHOUT AND WITH CONTRAST TECHNIQUE: Multiplanar and multiecho pulse sequences of the cervical spine, to include the craniocervical junction and cervicothoracic junction, and thoracic spine, were obtained without and with intravenous contrast. CONTRAST:  31m MULTIHANCE GADOBENATE DIMEGLUMINE 529 MG/ML IV SOLN COMPARISON:  Lumbar spine  MRI today FINDINGS: MRI CERVICAL SPINE FINDINGS The study is moderately motion degraded. Alignment: Cervical spine straightening.  No significant listhesis. Vertebrae: Diffuse heterogeneous replacement of normal marrow signal throughout the spine as described on prior lumbar MRI. No dominant enhancing lesion. Preserved vertebral body heights without evidence of fracture. Cord: There is abnormal T2 hyperintensity in the right lateral spinal cord at C3-4 extending over a length of approximately 1.5 cm. Smaller areas of abnormal T2 signal are present at the superior C6 endplate level and at the C6-7 disc space level, with the latter demonstrating enhancement. Posterior Fossa, vertebral arteries, paraspinal tissues: Discrete 4 mm T2 hyperintense nonenhancing lesion in the medulla. Additional T2 hyperintense lesions in the pons and left  cerebellum. Disc levels: The cervical spinal canal is small in caliber diffusely on a congenital basis. C2-3: Negative. C3-4: Shallow central disc protrusion without significant stenosis. C4-5: Mild disc bulging and uncovertebral spurring result in mild spinal stenosis and moderate bilateral neural foraminal stenosis. C5-6: Broad-based posterior disc osteophyte complex results in moderate spinal stenosis with mild cord flattening and moderate to severe bilateral neural foraminal stenosis. C6-7: Broad-based posterior disc osteophyte complex and central disc extrusion result in severe spinal stenosis with moderate cord flattening and severe bilateral neural foraminal stenosis. C7-T1: Mild uncovertebral spurring results in at most mild bilateral neural foraminal stenosis. No spinal stenosis. MRI THORACIC SPINE FINDINGS The study is mildly motion degraded. Alignment: Normal. Vertebrae: Diffuse heterogeneous replacement of normal marrow signal throughout the spine and ribs as well as visualized clavicles and sternum. No dominant enhancing lesion. Preserved vertebral body heights without  evidence of fracture. Cord: As seen on today's earlier lumbar MRI, there is patchy T2 hyperintensity in the distal thoracic spinal cord, with the most discrete lesion being on the left at T12. There is a small T2 hyperintense focus in the left central cord at T6-7. A small lesion versus artifact is questioned at T3. There is the suggestion of focal enhancement along the midline dorsal cord at T5 on axial images (series 21, image 21), however this is not confirmed on sagittal images. Paraspinal and other soft tissues: Unremarkable. Disc levels: Endplate spurring results in mild left neural foraminal stenosis at T1-2. A right posterolateral/foraminal disc osteophyte at T2-3 results in mild right neural foraminal stenosis. A shallow left paracentral disc osteophyte complex and left facet hypertrophy at T6-7 result in mild spinal stenosis and mild left neural foraminal stenosis. A slightly larger left paracentral disc osteophyte complex at T7-8 combines with facet hypertrophy to result in mild spinal stenosis. There is at most mild disc bulging more inferiorly in the thoracic spine without significant stenosis. IMPRESSION: 1. Scattered T2 hyperintense lesions throughout the cervical and thoracic spinal cord as well as in the pons, medulla, and cerebellum. A lesion at C6-7 is enhancing. While some lesions are associated with spinal stenosis and could in isolation reflect spondylotic myelopathy, the presence of lesions elsewhere in the cord and posterior fossa are indicative of a more widespread process such as demyelinating disease. Infection, inflammatory conditions such as sarcoidosis, and metastatic disease are additional considerations. Brain MRI and CSF analysis are recommended. 2. 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 3. Mild spinal stenosis at T6-7 and T7-8 due to disc and facet degeneration. 4. Diffusely abnormal bone marrow signal. Considerations  include multiple myeloma, metastatic disease, and other infiltrative/myelofibrotic marrow processes. Electronically Signed   By: Logan Bores M.D.   On: 03/21/2017 21:44   Mr Thoracic Spine W Wo Contrast  Result Date: 03/21/2017 CLINICAL DATA:  Bilateral lower extremity weakness. Urinary incontinence. Abnormal distal spinal cord on lumbar spine MRI today. EXAM: MRI CERVICAL AND THORACIC SPINE WITHOUT AND WITH CONTRAST TECHNIQUE: Multiplanar and multiecho pulse sequences of the cervical spine, to include the craniocervical junction and cervicothoracic junction, and thoracic spine, were obtained without and with intravenous contrast. CONTRAST:  40m MULTIHANCE GADOBENATE DIMEGLUMINE 529 MG/ML IV SOLN COMPARISON:  Lumbar spine MRI today FINDINGS: MRI CERVICAL SPINE FINDINGS The study is moderately motion degraded. Alignment: Cervical spine straightening.  No significant listhesis. Vertebrae: Diffuse heterogeneous replacement of normal marrow signal throughout the spine as described on prior lumbar MRI. No dominant enhancing lesion. Preserved vertebral  body heights without evidence of fracture. Cord: There is abnormal T2 hyperintensity in the right lateral spinal cord at C3-4 extending over a length of approximately 1.5 cm. Smaller areas of abnormal T2 signal are present at the superior C6 endplate level and at the C6-7 disc space level, with the latter demonstrating enhancement. Posterior Fossa, vertebral arteries, paraspinal tissues: Discrete 4 mm T2 hyperintense nonenhancing lesion in the medulla. Additional T2 hyperintense lesions in the pons and left cerebellum. Disc levels: The cervical spinal canal is small in caliber diffusely on a congenital basis. C2-3: Negative. C3-4: Shallow central disc protrusion without significant stenosis. C4-5: Mild disc bulging and uncovertebral spurring result in mild spinal stenosis and moderate bilateral neural foraminal stenosis. C5-6: Broad-based posterior disc osteophyte  complex results in moderate spinal stenosis with mild cord flattening and moderate to severe bilateral neural foraminal stenosis. C6-7: Broad-based posterior disc osteophyte complex and central disc extrusion result in severe spinal stenosis with moderate cord flattening and severe bilateral neural foraminal stenosis. C7-T1: Mild uncovertebral spurring results in at most mild bilateral neural foraminal stenosis. No spinal stenosis. MRI THORACIC SPINE FINDINGS The study is mildly motion degraded. Alignment: Normal. Vertebrae: Diffuse heterogeneous replacement of normal marrow signal throughout the spine and ribs as well as visualized clavicles and sternum. No dominant enhancing lesion. Preserved vertebral body heights without evidence of fracture. Cord: As seen on today's earlier lumbar MRI, there is patchy T2 hyperintensity in the distal thoracic spinal cord, with the most discrete lesion being on the left at T12. There is a small T2 hyperintense focus in the left central cord at T6-7. A small lesion versus artifact is questioned at T3. There is the suggestion of focal enhancement along the midline dorsal cord at T5 on axial images (series 21, image 21), however this is not confirmed on sagittal images. Paraspinal and other soft tissues: Unremarkable. Disc levels: Endplate spurring results in mild left neural foraminal stenosis at T1-2. A right posterolateral/foraminal disc osteophyte at T2-3 results in mild right neural foraminal stenosis. A shallow left paracentral disc osteophyte complex and left facet hypertrophy at T6-7 result in mild spinal stenosis and mild left neural foraminal stenosis. A slightly larger left paracentral disc osteophyte complex at T7-8 combines with facet hypertrophy to result in mild spinal stenosis. There is at most mild disc bulging more inferiorly in the thoracic spine without significant stenosis. IMPRESSION: 1. Scattered T2 hyperintense lesions throughout the cervical and thoracic  spinal cord as well as in the pons, medulla, and cerebellum. A lesion at C6-7 is enhancing. While some lesions are associated with spinal stenosis and could in isolation reflect spondylotic myelopathy, the presence of lesions elsewhere in the cord and posterior fossa are indicative of a more widespread process such as demyelinating disease. Infection, inflammatory conditions such as sarcoidosis, and metastatic disease are additional considerations. Brain MRI and CSF analysis are recommended. 2. 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 3. Mild spinal stenosis at T6-7 and T7-8 due to disc and facet degeneration. 4. Diffusely abnormal bone marrow signal. Considerations include multiple myeloma, metastatic disease, and other infiltrative/myelofibrotic marrow processes. Electronically Signed   By: Logan Bores M.D.   On: 03/21/2017 21:44   Dg Fluoro Guide Lumbar Puncture  Result Date: 03/22/2017 CLINICAL DATA:  Lower extremity weakness. Spinal cord lesions on MRI. Severe multilevel lumbar stenosis. EXAM: DIAGNOSTIC LUMBAR PUNCTURE UNDER FLUOROSCOPIC GUIDANCE FLUOROSCOPY TIME:  Fluoroscopy Time:  0 minutes, 42 seconds Radiation Exposure Index (if  provided by the fluoroscopic device): 8.9 mGy Number of Acquired Spot Images: 0 PROCEDURE: I discussed the risks (including hemorrhage, infection, headache, and nerve damage, among others), benefits, and alternatives to fluoroscopically guided lumbar puncture with the patient. We specifically discussed the high technical likelihood of success of the procedure. The patient understood and elected to undergo the procedure. Prior to the procedure I reviewed of the patient's lumbar spine MRI and determined that L5-S1 was the optimum level. Standard time-out was employed. Following sterile skin prep and local anesthetic administration consisting of 1 percent lidocaine, a 20 gauge spinal needle was advanced without  difficulty into the thecal sac at the at the L5-S1 level from a slightly paramedian approach. Clear CSF was returned. Opening pressure was not obtained. 15 cc of clear CSF was collected. The needle was subsequently removed and the skin cleansed and bandaged. No immediate complications were observed. IMPRESSION: 1. Technically successful fluoroscopically lumbar puncture at the L5-S1 level yielding 15 cc of clear CSF. Electronically Signed   By: Van Clines M.D.   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 Does the need for close, 24 hr/day medical supervision in concert with the patient's rehab needs make it unreasonable for this patient to be served in a less intensive setting? Yes 2. Co-Morbidities requiring supervision/potential complications: htn, diabetes two uncontrolled 3. Due to bladder management, bowel management, safety, skin/wound care, disease management, medication administration, pain management and patient education, does the patient require 24 hr/day rehab nursing? Yes 4. Does the patient require coordinated care of a physician, rehab nurse, PT (1-2 hrs/day, 5 days/week) and OT (1-2 hrs/day, 5 days/week) to address physical and functional deficits in the context of the above medical diagnosis(es)? Yes Addressing deficits in the following areas: balance, endurance, locomotion, strength, transferring, bowel/bladder control, bathing, dressing, feeding, grooming, toileting and psychosocial support 5. Can the patient actively participate in an intensive therapy program of at least 3 hrs of therapy per day at least 5 days per week? Yes 6. The potential for patient to make measurable gains while on inpatient rehab is excellent 7. Anticipated functional outcomes upon discharge from inpatient rehab are modified independent and supervision  with PT, modified independent and supervision with OT, n/a with SLP. 8. Estimated rehab length of stay  to reach the above functional goals is: TBD 9. Anticipated D/C setting: Home 10. Anticipated post D/C treatments: HH therapy and Outpatient therapy 11. Overall Rehab/Functional Prognosis: excellent  RECOMMENDATIONS: This patient's condition is appropriate for continued rehabilitative care in the following setting: CIR Patient has agreed to participate in recommended program. Yes Note that insurance prior authorization may be required for reimbursement for recommended care.  Comment: Pt with definite myelopathic signs on exam, right more involved then left. Await NS opinion re: surgical options. Rehab Admissions Coordinator to follow up.  Thanks,  Meredith Staggers, MD, Mellody Drown    Lavon Paganini Angiulli, PA-C 03/23/2017

## 2017-03-23 NOTE — Progress Notes (Signed)
Physical medicine rehabilitation consult requested chart reviewed. Patient presently undergoing workup for right-sided weakness due to multiple etiologies including cervical spinal stenosis, multilevel thoracic spinal stenosis and intramedullary lesions. Plan evaluation for demyelinating disease. MRI of the brain is pending. Will await follow films and neurosurgery follow-up for possible need for decompression and then follow-up with appropriate recommendations

## 2017-03-23 NOTE — Progress Notes (Signed)
Neurology Progress Note   S:// Seen and examined.  No new complaints.   O:// Current vital signs: BP (!) 144/81 (BP Location: Left Arm)   Pulse 94   Temp 99.6 F (37.6 C) (Oral)   Resp 18   Ht 6' 4"  (1.93 m)   Wt 130.9 kg (288 lb 9.3 oz)   SpO2 97%   BMI 35.13 kg/m   Vital signs in last 24 hours: Temp:  [98.2 F (36.8 C)-99.6 F (37.6 C)] 99.6 F (37.6 C) (02/19 0744) Pulse Rate:  [72-100] 94 (02/19 0744) Resp:  [18] 18 (02/19 0744) BP: (119-151)/(32-81) 144/81 (02/19 0744) SpO2:  [97 %-100 %] 97 % (02/19 0744) GENERAL: Awake, alert in NAD HEENT: - Normocephalic and atraumatic, dry mm, no LN++, no Thyromegally LUNGS - Clear to auscultation bilaterally with no wheezes CV - S1S2 RRR, no m/r/g, equal pulses bilaterally. ABDOMEN - Soft, nontender, nondistended with normoactive BS Ext: warm, well perfused, intact peripheral pulses, no edema NEURO:  Mental Status: AA&Ox3  Language: speech is not dysarthric.  Naming, repetition, fluency, and comprehension intact. Cranial Nerves: PERRL. EOMI, visual fields full, no facial asymmetry, facial sensation intact, hearing intact, tongue/uvula/soft palate midline, normal sternocleidomastoid and trapezius muscle strength. No evidence of tongue atrophy or fibrillations Motor: 5/5 left upper extremity.  4+/5 right deltoid, biceps and triceps.  3/5 small muscles of the right hand.  2/5 right hip flexors.  3/5 knee flexors and extensors on the right.  3/5 plantar flexion on the right.  2/5 dorsiflexion on the right.  Left lower extremity 4/5 all over. Tone: is normal and bulk is normal Sensation- Intact to light touch bilaterally Coordination: FTN intact bilaterally Gait- deferred Deep tendon reflexes: Hyperreflexic in both knees, sustained clonus in both ankles.  Medications  Current Facility-Administered Medications:  .  0.9 %  sodium chloride infusion, 250 mL, Intravenous, PRN, Opyd, Ilene Qua, MD .  acetaminophen (TYLENOL) tablet 650  mg, 650 mg, Oral, Q6H PRN **OR** acetaminophen (TYLENOL) suppository 650 mg, 650 mg, Rectal, Q6H PRN, Opyd, Ilene Qua, MD .  B-complex with vitamin C tablet 1 tablet, 1 tablet, Oral, Daily, Opyd, Ilene Qua, MD, 1 tablet at 03/23/17 0912 .  bisacodyl (DULCOLAX) EC tablet 5 mg, 5 mg, Oral, Daily PRN, Opyd, Timothy S, MD .  hydrALAZINE (APRESOLINE) tablet 10 mg, 10 mg, Oral, Q8H PRN, Annita Brod, MD, 10 mg at 03/23/17 0912 .  HYDROcodone-acetaminophen (NORCO/VICODIN) 5-325 MG per tablet 1-2 tablet, 1-2 tablet, Oral, Q4H PRN, Opyd, Ilene Qua, MD, 2 tablet at 03/22/17 0258 .  insulin aspart (novoLOG) injection 0-5 Units, 0-5 Units, Subcutaneous, QHS, Opyd, Ilene Qua, MD, 4 Units at 03/22/17 2209 .  insulin aspart (novoLOG) injection 0-9 Units, 0-9 Units, Subcutaneous, TID WC, Opyd, Ilene Qua, MD, 7 Units at 03/23/17 0646 .  insulin glargine (LANTUS) injection 5 Units, 5 Units, Subcutaneous, NOW, [place] Pink, Sendil K, MD .  insulin glargine (LANTUS) injection 5 Units, 5 Units, Subcutaneous, QHS, [place] Pink, Sendil K, MD .  lisinopril (PRINIVIL,ZESTRIL) tablet 10 mg, 10 mg, Oral, Daily, Annita Brod, MD, 10 mg at 03/23/17 0912 .  LORazepam (ATIVAN) injection 1 mg, 1 mg, Intravenous, Once, Lacretia Leigh, MD .  methylPREDNISolone sodium succinate (SOLU-MEDROL) 1,000 mg in sodium chloride 0.9 % 50 mL IVPB, 1,000 mg, Intravenous, Daily, Annita Brod, MD, Last Rate: 58 mL/hr at 03/23/17 0916, 1,000 mg at 03/23/17 0916 .  ondansetron (ZOFRAN) tablet 4 mg, 4 mg, Oral, Q6H PRN **OR** ondansetron (ZOFRAN) injection 4 mg,  4 mg, Intravenous, Q6H PRN, Opyd, Timothy S, MD .  pantoprazole (PROTONIX) EC tablet 40 mg, 40 mg, Oral, Daily, Annita Brod, MD, 40 mg at 03/23/17 0912 .  senna-docusate (Senokot-S) tablet 1 tablet, 1 tablet, Oral, QHS PRN, Opyd, Timothy S, MD .  sodium chloride flush (NS) 0.9 % injection 3 mL, 3 mL, Intravenous, Q12H, Opyd, Ilene Qua, MD, 3 mL at 03/22/17 0229 .  sodium  chloride flush (NS) 0.9 % injection 3 mL, 3 mL, Intravenous, Q12H, Opyd, Ilene Qua, MD, 3 mL at 03/22/17 2118 .  sodium chloride flush (NS) 0.9 % injection 3 mL, 3 mL, Intravenous, PRN, Vianne Bulls, MD Labs CBC    Component Value Date/Time   WBC 7.5 03/22/2017 0152   RBC 4.43 03/22/2017 0152   HGB 13.4 03/22/2017 0152   HCT 38.6 (L) 03/22/2017 0152   PLT 238 03/22/2017 0152   MCV 87.1 03/22/2017 0152   MCH 30.2 03/22/2017 0152   MCHC 34.7 03/22/2017 0152   RDW 12.8 03/22/2017 0152   LYMPHSABS 2.3 03/22/2017 0152   MONOABS 0.6 03/22/2017 0152   EOSABS 0.1 03/22/2017 0152   BASOSABS 0.0 03/22/2017 0152    CMP     Component Value Date/Time   NA 138 03/23/2017 0427   K 4.2 03/23/2017 0427   CL 105 03/23/2017 0427   CO2 21 (L) 03/23/2017 0427   GLUCOSE 283 (H) 03/23/2017 0427   BUN 11 03/23/2017 0427   CREATININE 0.90 03/23/2017 0427   CALCIUM 9.2 03/23/2017 0427   PROT 7.5 03/21/2017 0853   ALBUMIN 4.1 03/21/2017 0853   AST 36 03/21/2017 0853   ALT 29 03/21/2017 0853   ALKPHOS 64 03/21/2017 0853   BILITOT 1.2 03/21/2017 0853   GFRNONAA >60 03/23/2017 0427   GFRAA >60 03/23/2017 0427    Imaging I have reviewed images in epic and the results pertinent to this consultation are: MRI examination of the C-spine shows scattered T2 hyperintense lesions throughout the cervical and thoracic cord as well as in the pons, medulla and cerebellum.  There is severe spinal stenosis at C6-7 with abnormal cord signal.  There is also enhancement with contrast seen at C6-7, which could be secondary to the compressive myelopathy.  No other enhancement.  There is diffusely abnormal bone marrow signal all throughout his spine.  Multiple levels of degenerative disc disease.  There is also spinal stenosis at T6-7 and T7-8 levels.  Assessment:  52 year old man past history of hypertension, diabetes with progressive right leg weakness presented to the emergency room for severe worsening of  bilateral leg weakness right more than left. His examination is suggestive of myelopathic features including hyperreflexia and clonus. He is weaker on his right upper extremity with evidence of some atrophy of the small muscles of the hand. He is also weaker on the right lower extremity with hyperreflexia and clonus. His imaging studies show multilevel degenerative disc disease with cord compression at C5-6 spinal level. He interestingly also has a lot of scattered T2 hyperintensities in the spinal cord, and upon further questioning provided me history that when he turned 52 years of age, he lost vision in his right eye for a few days which then slowly resolved, raising suspicion for a demyelinating phenomenon. Other differentials for his spinal cord abnormalities could include malignancy but intramedullary metastasis without any cord expansion are extremely rare.  Other differentials to consider will be sarcoid, and given the diffusely abnormal bone marrow signal multiple myeloma and infiltrative myelofibrotic marrow  processes. For now, I would wait for the MRI of the brain which will be done later today (cannot be given gadolinium for 48 hours since last scan), and I would recommend neurosurgical consultation for possible decompression of the cervical spinal stenosis.  Impression: Right-sided weakness-due to multiple etiologies including cervical spinal stenosis C5-6 level, multilevel thoracic spinal stenosis, and intramedullary lesions noted from brain stem all the way to the lower thoracic spine. -Needs evaluation for demyelinating disease -Needs evaluation for other conditions on differentials such as sarcoid, malignancy, and NMO (less likely given no longitudinally extensive lesions) -Consider malignancy workup of all of the above workup is negative  Recommendations: MRI brain with and without contrast Neurosurgical consultation.  I spoke personally with Dr. Kathyrn Sheriff, who will be seeing the  patient for possible cervical spinal stenosis decompression and possibly thoracic spinal stenosis decompression. Preliminary CSF results show increased protein and a cell count of 11.  Nonspecific findings for now.  Flow cytometry, cytology, IgG index and oligoclonal bands pending. Would recommend treating with high-dose steroids-1 g Solu-Medrol daily for total of 5 doses. We will follow with you  -- Amie Portland, MD [facility] Pager: [phone] If 7pm to 7am, please call on call as listed on AMION.

## 2017-03-23 NOTE — Progress Notes (Signed)
I await further clarification of overall neurosurgical plans and timing before pursuing rehab. I will meet with pt tomorrow. [phone]

## 2017-03-23 NOTE — Progress Notes (Signed)
Inpatient Rehabilitation  Per PT request, patient was screened by Davontae Prusinski for appropriateness for an Inpatient Acute Rehab consult.  At this time we are recommending an Inpatient Rehab consult.  Text paged MD to notify; please order if you are agreeable.    Everest Hacking, M.A., CCC/SLP Admission Coordinator  [facility]  Cell [phone]  

## 2017-03-23 NOTE — Progress Notes (Signed)
Pt transported off unit to MRI. P. Amo Janie Strothman RN 

## 2017-03-23 NOTE — Progress Notes (Signed)
PT Cancellation Note  Patient Details Name: Dylan [name] MRN: [id number] DOB: August 18, 1965   Cancelled Treatment:    Reason Eval/Treat Not Completed: Patient not medically ready pt on bedrest at this time. Will await increase in activity orders prior to PT evaluation. Will follow.   Blake Divine A Starr Engel 03/23/2017, 7:53 AM Mylo Red, PT, DPT [phone]

## 2017-03-23 NOTE — Care Management Note (Signed)
Case Management Note  Patient Details  Name: Dylan [name] MRN: [id number] Date of Birth: 1965/03/24  Subjective/Objective:   Pt without a PCP and insurance. CM met with patient and discussed [facility].                 Action/Plan: CM was able to obtain an appointment at the [facility]. Information on the AVS. Spoke to patient about using the [facility] pharmacy for his medications.   Expected Discharge Date:                  Expected Discharge Plan:     In-House Referral:     Discharge planning Services  CM Consult, [facility]  Post Acute Care Choice:    Choice offered to:     DME Arranged:    DME Agency:     HH Arranged:    HH Agency:     Status of Service:  In process, will continue to follow  If discussed at Long Length of Stay Meetings, dates discussed:    Additional Comments:  Pollie Friar, RN 03/23/2017, 4:34 PM

## 2017-03-23 NOTE — Progress Notes (Addendum)
Inpatient Diabetes Program Recommendations  AACE/ADA: New Consensus Statement on Inpatient Glycemic Control (2015)  Target Ranges:  Prepandial:   less than 140 mg/dL      Peak postprandial:   less than 180 mg/dL (1-2 hours)      Critically ill patients:  140 - 180 mg/dL   Lab Results  Component Value Date   GLUCAP 313 (H) 03/23/2017   HGBA1C 8.4 (H) 03/22/2017    Review of Glycemic Control Results for Dylan [name], Dylan [name] (MRN [id number]) as of 03/23/2017 14:39  Ref. Range 03/22/2017 21:38 03/23/2017 04:27 03/23/2017 06:21 03/23/2017 11:35  Glucose-Capillary Latest Ref Range: 65 - 99 mg/dL 409 (H)  811 (H) 914 (H)   Diabetes history: Type 2 DM Outpatient Diabetes medicaions: Metformin 500 mg BID Current orders for Inpatient glycemic control: Novolog 0-9 units TIDAC, Lantus 5 Units QHS, Novolog 0-5 Units QHS, Solumedrol 1,000 mg Q24H  Inpatient Diabetes Program Recommendations:    Noted the Solumedrol, most likely contributing to increased blood sugars. Patient needs more basal insulin based on AM FSBS of 313 mg/dL. Would recommend increasing Lantus to 25 Units QHS (130.9 kg X 0.2), with expectations to adjust as necessary. Also, consider adding meal coverage as post prandials are elevated. Recommending Novolog 5 Units TIDAC (when patient consumes >50% of meal). These adjustments will need to be watched carefully if Solumedrol is decreased.    Addendum: Spoke with patient regarding home regimen and confirmed patient was taking Metformin 500 mg BID. Reviewed patient's current A1c of 8.4%. Explained what a A1c is and what it measures. Also reviewed goal A1c with patient, importance of good glucose control @ home, and blood sugar goals. Patient does note have PCP so Provided information about [facility], and need for PCP; will place consult for Case Management to establish care. Also, gave information about the outpatient education clases at [facility]. Patient receptive.  At  discharge, could increase Metformin to 1,000 mg BID.  Discussed the importance of diet. Patient admits to eating fast food, drink high quantities of soda and juice. Discussed carb counting and the importance of establishing a balance and using juice only for hypoglycemic events. We discussed survival skills. Lastly, patient reported to be checking blood sugars 1-2 times a day. So, has a meter and strips.    Thanks, Lujean Rave, MSN, RNC-OB Diabetes Coordinator [phone] (8a-5p)

## 2017-03-23 NOTE — Evaluation (Signed)
Occupational Therapy Evaluation Patient Details Name: Dylan [name] MRN: [id number] DOB: 01-23-1966 Today's Date: 03/23/2017    History of Present Illness Patient is a 52 y/o male who presents with 7 month hx of progressive weakness, Right>left, s/p 2 falls at home. 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 Cord compression at C5-6 spinal level. DD includes MS, malignancy, sarcoidosis, myelitis. BRain MRI pending. s/p 1/5 doses of Solu-Medrol. PMH includes HTN, DM.   Clinical Impression   This 52 y/o M presents with the above. Pt lives alone, at baseline is independent with ADLs, iADLs, and has recently been using RW for functional mobility. Pt presenting this session supine in bed, pleasant and willing to work with therapy. Pt demonstrating decreased dynamic balance, impaired UE FMC, decreased functional performance. Pt required ModA+2 for stand pivot transfer using RW, requires ModA+2 for LB ADLs. Pt very motivated to work with therapy and progress towards PLOF. Feel Pt is an excellent candidate for CIR level services to maximize his overall safety and independence with ADLs and mobility. Will continue to follow acutely.     Follow Up Recommendations  CIR;Supervision/Assistance - 24 hour    Equipment Recommendations  Other (comment)(TBD in next venue )           Precautions / Restrictions Precautions Precautions: Fall Precaution Comments: BLE weakness; clonus Bil ankles Restrictions Weight Bearing Restrictions: No      Mobility Bed Mobility Overal bed mobility: Needs Assistance Bed Mobility: Rolling;Sidelying to Sit Rolling: Min guard Sidelying to sit: Min guard;HOB elevated       General bed mobility comments: Able to get to EOB with increased effort and use of  rail.  Transfers Overall transfer level: Needs assistance Equipment used: Rolling walker (2 wheeled) Transfers: Sit to/from Stand Sit to Stand: Mod assist;+2 physical assistance;From elevated surface         General transfer comment: Assist of 2 to power to standing with cues for hand placement/technique. Assist with knee/hip extension and cues for WB through BUEs.    Balance Overall balance assessment: Needs assistance;History of Falls Sitting-balance support: Feet supported;Bilateral upper extremity supported Sitting balance-Leahy Scale: Fair Sitting balance - Comments: Pt with severe posterior lean and trunk compensation during AROM of BLEs.    Standing balance support: During functional activity;Bilateral upper extremity supported Standing balance-Leahy Scale: Poor Standing balance comment: Requires BUE support on RW and Mod A for balance. Cues for knee/hip extension and WB through BUEs. Right knee instability noted.                            ADL either performed or assessed with clinical judgement   ADL Overall ADL's : Needs assistance/impaired Eating/Feeding: Modified independent;Sitting   Grooming: Set up;Sitting   Upper Body Bathing: Minimal assistance;Sitting   Lower Body Bathing: Moderate assistance;+2 for physical assistance;+2 for safety/equipment;Sit to/from stand   Upper Body Dressing : Minimal assistance;Sitting   Lower Body Dressing: Moderate assistance;+2 for physical assistance;+2 for safety/equipment;Sit to/from stand Lower Body Dressing Details (indicate cue type and reason): pt able to don socks seated EOB using figure 4 technique, requires increased time/effort to complete due to decreased [facility]  Toilet Transfer: Moderate assistance;+2 for physical assistance;+2 for safety/equipment;Stand-pivot;BSC;RW Toilet Transfer Details (indicate cue type and reason): simulated in transfer to recliner  Toileting- Clothing Manipulation and Hygiene: Moderate  assistance;+2 for physical assistance;+2 for safety/equipment;Sit to/from stand       Functional mobility during ADLs: Moderate assistance;+2 for physical assistance;+2 for safety/equipment;Rolling walker       Vision Baseline Vision/History: Wears glasses Wears Glasses: At all times Patient Visual Report: No change from baseline Vision Assessment?: No apparent visual deficits                Pertinent Vitals/Pain Pain Assessment: No/denies pain     Hand Dominance Right   Extremity/Trunk Assessment Upper Extremity Assessment Upper Extremity Assessment: LUE deficits/detail;RUE deficits/detail RUE Deficits / Details: shoulder and elbow strength grossly 4/5; decreased grip strength and FMC  RUE Coordination: decreased fine motor LUE Deficits / Details: shoulder and elbow strength grossly 4/5; decreased grip strength and FMC  LUE Coordination: decreased fine motor   Lower Extremity Assessment Lower Extremity Assessment: Defer to PT evaluation RLE Deficits / Details: Grossly ~2/5 DF/PF, 2+/5 knee extension, 3/5 knee flexion, 3/5 hip flexion. RLE Sensation: decreased proprioception;WNL RLE Coordination: decreased gross motor;decreased fine motor LLE Deficits / Details: Grossly ~4/5 knee extension, 4/5 DF/PF, 3/5 hip flexion LLE Sensation: decreased proprioception;WNL LLE Coordination: decreased gross motor;decreased fine motor       Communication Communication Communication: No difficulties   Cognition Arousal/Alertness: Awake/alert Behavior During Therapy: WFL for tasks assessed/performed Overall Cognitive Status: Within Functional Limits for tasks assessed                                     General Comments  Pt with 3-4 beats of sustained clonus bil ankles. Negative babinski bilaterally.               Home Living Family/patient expects to be discharged to:: Private residence Living Arrangements: Alone Available Help at Discharge:  Neighbor;Available PRN/intermittently Type of Home: Apartment Home Access: Stairs to enter Entrance Stairs-Number of Steps: 1 Entrance Stairs-Rails: None Home Layout: One level     Bathroom Shower/Tub: Chief Strategy Officer: Handicapped height     Home Equipment: Environmental consultant - 2 wheels;Cane - single point          Prior Functioning/Environment Level of Independence: Independent        Comments: Dietitian; Drives.         OT Problem List: Decreased strength;Decreased activity tolerance;Decreased range of motion;Impaired balance (sitting and/or standing);Decreased coordination;Decreased knowledge of use of DME or AE;Impaired UE functional use      OT Treatment/Interventions: Self-care/ADL training;DME and/or AE instruction;Balance training;Therapeutic activities;Therapeutic exercise;Manual therapy;Neuromuscular education;Energy conservation;Patient/family education    OT Goals(Current goals can be found in the care plan section) Acute Rehab OT Goals Patient Stated Goal: to return to PLOF and be able to walk OT Goal Formulation: With patient Time For Goal Achievement: 04/06/17 Potential to Achieve Goals: Good  OT Frequency: Min 3X/week               Co-evaluation PT/OT/SLP Co-Evaluation/Treatment: Yes Reason for Co-Treatment: For patient/therapist safety;To address functional/ADL transfers PT goals addressed during session: Mobility/safety with mobility;Balance OT goals addressed during session: ADL's and self-care;Proper use of Adaptive equipment and DME      AM-PAC PT "6 Clicks" Daily Activity     Outcome Measure Help from another person eating meals?: None Help from another person taking care of personal grooming?: A Little Help from another person toileting, which includes using toliet, bedpan, or urinal?: A Lot Help  from another person bathing (including washing, rinsing, drying)?: A Lot Help from another person to put on and taking off regular  upper body clothing?: A Little Help from another person to put on and taking off regular lower body clothing?: A Lot 6 Click Score: 16   End of Session Equipment Utilized During Treatment: Gait belt;Rolling walker Nurse Communication: Mobility status  Activity Tolerance: Patient tolerated treatment well Patient left: in chair;with call bell/phone within reach;with chair alarm set  OT Visit Diagnosis: Unsteadiness on feet (R26.81);Muscle weakness (generalized) (M62.81)                Time: 2202-5427 OT Time Calculation (min): 33 min Charges:  OT General Charges $OT Visit: 1 Visit OT Evaluation $OT Eval Moderate Complexity: 1 Mod G-Codes:     Marcy Siren, OT Pager [phone] 03/23/2017   Orlando Penner 03/23/2017, 1:47 PM

## 2017-03-23 NOTE — Consult Note (Signed)
Chief Complaint   Chief Complaint  Patient presents with  . Fall    HPI   Reason for consult: abnormal cervical, thoracic and lumbar MRI Consult requested by: Neurology   HPI: Dylan [name] is a 52 y.o. male who presented to ER due to inability to move lower extremities on 03/21/17. Fell earlier that morning so called neighbor for assistance into chair. No serious injury, - LOC, - head injury. After several hours, decided to get up from chair, but was unable to move extremities (including wiggling toes, bending at ankle/knee/hip) so immediately called EMS. Reports gradual weakness in RLE over the course of several months. Was initially seen in [place] and was diagnosed with sciatica and states it was recommended he have surgery, but he declined.  The inability to move BLE is the first and only episode he has ever had. Since being in hospital, his LE strength has been gradually improving, L>R. Endorses episodes of urinary incontinence.  Fairly healthy otherwise with pmhx only significant for DM and HTN. Not on anti-coag.  Patient Active Problem List   Diagnosis Date Noted  . Acute right-sided weakness 03/22/2017  . Morbid obesity ([place]) 03/22/2017  . Diabetes mellitus without complication ([place]) 29/51/8841  . Hypertension 03/21/2017  . 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 Spinal cord lesion ([place]) 03/21/2017  . Bone lesion 03/21/2017    PMH: Past Medical History:  Diagnosis Date  . 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 Diabetes mellitus without complication ([place])   . Hypertension     PSH: Past Surgical History:  Procedure Laterality Date  . NOSE SURGERY     AS A CHILD    Medications Prior to Admission  Medication Sig Dispense Refill Last Dose  . b complex vitamins tablet Take 1 tablet by mouth daily.   Past Month at Unknown time  . lisinopril (PRINIVIL,ZESTRIL) 10 MG tablet Take 10 mg by mouth daily.   03/20/2017 at Unknown time  . metFORMIN (GLUCOPHAGE) 500 MG tablet Take 500 mg by  mouth 2 (two) times daily with a meal.   03/20/2017 at Unknown time    SH: Social History   Tobacco Use  . Smoking status: Never Smoker  . Smokeless tobacco: Never Used  Substance Use Topics  . Alcohol use: No    Frequency: Never  . Drug use: No    MEDS: Prior to Admission medications   Medication Sig Start Date End Date Taking? Authorizing Provider  b complex vitamins tablet Take 1 tablet by mouth daily.   Yes [provider]  lisinopril (PRINIVIL,ZESTRIL) 10 MG tablet Take 10 mg by mouth daily.   Yes [provider]  metFORMIN (GLUCOPHAGE) 500 MG tablet Take 500 mg by mouth 2 (two) times daily with a meal.   Yes [provider]    ALLERGY: No Known Allergies  Social History   Tobacco Use  . Smoking status: Never Smoker  . Smokeless tobacco: Never Used  Substance Use Topics  . Alcohol use: No    Frequency: Never     Family History  Problem Relation Age of Onset  . Prostate cancer Father      ROS   Review of Systems  Constitutional: Negative.   HENT: Negative.   Eyes: Negative.   Respiratory: Negative.   Cardiovascular: Negative.   Gastrointestinal: Negative.   Genitourinary: Negative.   Musculoskeletal: Positive for back pain, falls, joint pain, myalgias and neck pain.  Neurological: Positive for tingling, tremors (RLE) and focal weakness (RLE). Negative  for dizziness, sensory change, speech change, seizures, loss of consciousness and headaches.    Exam   Vitals:   03/23/17 0339 03/23/17 0744  BP: (!) 143/71 (!) 144/81  Pulse: 82 94  Resp: 18 18  Temp: 98.6 F (37 C) 99.6 F (37.6 C)  SpO2: 97% 97%   General appearance: WDWN, sitting upright in chair comfortably, NAD Eyes: PERRL, Fundoscopic: normal Cardiovascular: Regular rate and rhythm without murmurs, rubs, gallops. No edema or variciosities. Distal pulses normal. Pulmonary: Clear to auscultation Musculoskeletal:     Muscle tone upper extremities: Normal    Muscle  tone lower extremities: Normal    Motor exam: Upper Extremities Deltoid Bicep Tricep Grip  Right 5/5 5/5 5/5 4/5, right digit contractures at DIP of digits 4-5   Left 5/5 5/5 5/5 4/5   Lower Extremity IP Quad PF DF EHL  Right 4-/5 4-/5 2/5 1/5 1/5 - minimal toe wiggling  Left 5/5 5/5 5/5 5/5 5/5   Neurological Awake, alert, oriented Memory and concentration grossly intact Speech fluent, appropriate CNII: Visual fields normal CNIII/IV/VI: EOMI CNV: Facial sensation normal CNVII: Symmetric, normal strength CNVIII: Grossly normal CNIX: Normal palate movement CNXI: Trap and SCM strength normal CN XII: Tongue protrusion normal Sensation grossly intact to LT DTR: decreased b/l achilles and triceps, otherwise grossly normal Coordination (finger/nose & heel/shin): Normal  Results - Imaging/Labs   Results for orders placed or performed during the hospital encounter of 03/21/17 (from the past 48 hour(s))  HIV antibody (Routine Testing)     Status: None   Collection Time: 03/22/17  1:52 AM  Result Value Ref Range   HIV Screen 4th Generation wRfx Non Reactive Non Reactive    Comment: (NOTE) Performed At: [facility] [address] [place], [place] [id number] Rush Farmer MD [id number] Performed at [facility], [place] [address], [place], [place] [address]   Basic metabolic panel     Status: Abnormal   Collection Time: 03/22/17  1:52 AM  Result Value Ref Range   Sodium 140 135 - 145 mmol/L   Potassium 3.6 3.5 - 5.1 mmol/L   Chloride 106 101 - 111 mmol/L   CO2 22 22 - 32 mmol/L   Glucose, Bld 252 (H) 65 - 99 mg/dL   BUN 8 6 - 20 mg/dL   Creatinine, Ser 1.22 0.61 - 1.24 mg/dL   Calcium 8.8 (L) 8.9 - 10.3 mg/dL   GFR calc non Af Amer >60 >60 mL/min   GFR calc Af Amer >60 >60 mL/min    Comment: (NOTE) The eGFR has been calculated using the CKD EPI equation. This calculation has not been validated in all clinical situations. eGFR's persistently <60 mL/min  signify possible Chronic Kidney Disease.    Anion gap 12 5 - 15    Comment: Performed at [facility] [address], [place], [place] [address]  CBC WITH DIFFERENTIAL     Status: Abnormal   Collection Time: 03/22/17  1:52 AM  Result Value Ref Range   WBC 7.5 4.0 - 10.5 K/uL   RBC 4.43 4.22 - 5.81 MIL/uL   Hemoglobin 13.4 13.0 - 17.0 g/dL   HCT 38.6 (L) 39.0 - 52.0 %   MCV 87.1 78.0 - 100.0 fL   MCH 30.2 26.0 - 34.0 pg   MCHC 34.7 30.0 - 36.0 g/dL   RDW 12.8 11.5 - 15.5 %   Platelets 238 150 - 400 K/uL   Neutrophils Relative % 60 %   Neutro  Abs 4.5 1.7 - 7.7 K/uL   Lymphocytes Relative 30 %   Lymphs Abs 2.3 0.7 - 4.0 K/uL   Monocytes Relative 9 %   Monocytes Absolute 0.6 0.1 - 1.0 K/uL   Eosinophils Relative 1 %   Eosinophils Absolute 0.1 0.0 - 0.7 K/uL   Basophils Relative 0 %   Basophils Absolute 0.0 0.0 - 0.1 K/uL    Comment: Performed at [facility] [address] [place], [place] [address]  RPR     Status: None   Collection Time: 03/22/17  1:52 AM  Result Value Ref Range   RPR Ser Ql Non Reactive Non Reactive    Comment: (NOTE) Performed At: [facility] [place], [place] [id number] Rush Farmer MD [id number] Performed at [facility], [place] [address] [place], [place] [address]   CBG monitoring, ED     Status: Abnormal   Collection Time: 03/22/17  2:22 AM  Result Value Ref Range   Glucose-Capillary 227 (H) 65 - 99 mg/dL  CBG monitoring, ED     Status: Abnormal   Collection Time: 03/22/17  6:57 AM  Result Value Ref Range   Glucose-Capillary 170 (H) 65 - 99 mg/dL  Hemoglobin A1c     Status: Abnormal   Collection Time: 03/22/17  1:55 PM  Result Value Ref Range   Hgb A1c MFr Bld 8.4 (H) 4.8 - 5.6 %    Comment: (NOTE) Pre diabetes:          5.7%-6.4% Diabetes:              >6.4% Glycemic control for   <7.0% adults with diabetes    Mean Plasma Glucose 194.38 mg/dL    Comment: Performed at [facility] [address] [place], [place] [address]  Glucose, CSF     Status: Abnormal   Collection Time: 03/22/17  4:18 PM  Result Value Ref Range   Glucose, CSF 107 (H) 40 - 70 mg/dL    Comment: Performed at [facility] [address], [place], [place] [address]  Protein, CSF     Status: Abnormal   Collection Time: 03/22/17  4:18 PM  Result Value Ref Range   Total  Protein, CSF 65 (H) 15 - 45 mg/dL    Comment: Performed at [facility] [address] [place], [place] [address]  CSF cell count with differential     Status: Abnormal   Collection Time: 03/22/17  4:18 PM  Result Value Ref Range   Tube # 1    Color, CSF COLORLESS COLORLESS   Appearance, CSF CLEAR CLEAR   Supernatant NOT INDICATED    RBC Count, CSF 53 (H) 0 /cu mm   WBC, CSF 11 (HH) 0 - 5 /cu mm    Comment: CRITICAL RESULT CALLED TO, READ BACK BY AND VERIFIED WITH: A MCCLAIN,RN 1816 03/22/2017 WBOND    Segmented Neutrophils-CSF 1 0 - 6 %   Lymphs, CSF 90 (H) 40 - 80 %   Monocyte-Macrophage-Spinal Fluid 9 (L) 15 - 45 %   Eosinophils, CSF 0 0 - 1 %   Other Cells, CSF 0     Comment: Performed at [facility] [address], [place], [place] [address]  Glucose, capillary     Status: Abnormal   Collection Time: 03/22/17  4:26 PM  Result Value Ref Range   Glucose-Capillary 209 (H) 65 - 99 mg/dL  CSF culture  Status: None (Preliminary result)   Collection Time: 03/22/17  4:30 PM  Result Value Ref Range   Specimen Description CSF    Special Requests NONE    Gram Stain      WBC PRESENT, PREDOMINANTLY MONONUCLEAR NO ORGANISMS SEEN CYTOSPIN SMEAR    Culture      NO GROWTH < 24 HOURS Performed at [facility], [address] [place], [place] [address]    Report Status PENDING   Glucose, capillary     Status: Abnormal   Collection Time: 03/22/17  9:38 PM  Result Value Ref Range   Glucose-Capillary 320 (H) 65 - 99 mg/dL  Basic metabolic panel     Status: Abnormal   Collection Time: 03/23/17   4:27 AM  Result Value Ref Range   Sodium 138 135 - 145 mmol/L   Potassium 4.2 3.5 - 5.1 mmol/L   Chloride 105 101 - 111 mmol/L   CO2 21 (L) 22 - 32 mmol/L   Glucose, Bld 283 (H) 65 - 99 mg/dL   BUN 11 6 - 20 mg/dL   Creatinine, Ser 0.90 0.61 - 1.24 mg/dL   Calcium 9.2 8.9 - 10.3 mg/dL   GFR calc non Af Amer >60 >60 mL/min   GFR calc Af Amer >60 >60 mL/min    Comment: (NOTE) The eGFR has been calculated using the CKD EPI equation. This calculation has not been validated in all clinical situations. eGFR's persistently <60 mL/min signify possible Chronic Kidney Disease.    Anion gap 12 5 - 15    Comment: Performed at [facility] [address], [place], [place] [address]  Glucose, capillary     Status: Abnormal   Collection Time: 03/23/17  6:21 AM  Result Value Ref Range   Glucose-Capillary 307 (H) 65 - 99 mg/dL    Dg Chest 2 View  Result Date: 03/21/2017 CLINICAL DATA:  Right-sided weakness. EXAM: CHEST  2 VIEW COMPARISON:  None. FINDINGS: AP and lateral views of the chest were obtained. The lungs are clear without focal pneumonia, edema, pneumothorax or pleural effusion. Minimal atelectasis noted left base. The cardiopericardial silhouette is within normal limits for size. The visualized bony structures of the thorax are intact. Telemetry leads overlie the chest. IMPRESSION: No active cardiopulmonary disease. Electronically Signed   By: Misty Stanley M.D.   On: 03/21/2017 13:08   Mr Lumbar Spine Wo Contrast  Result Date: 03/21/2017 CLINICAL DATA:  Increasing leg weakness.  Urinary incontinence. EXAM: MRI LUMBAR SPINE WITHOUT CONTRAST TECHNIQUE: Multiplanar, multisequence MR imaging of the lumbar spine was performed. No intravenous contrast was administered. COMPARISON:  None. FINDINGS: Segmentation:  Assumed standard. Alignment: Mild straightening of the lumbar lordosis. Sagittal alignment is maintained. Vertebrae: Diffusely heterogeneous marrow signal throughout the lumbar  spine with multifocal areas of abnormal marrow replacement. Similar appearing marrow signal abnormality is seen in the visualized sacrum, bilateral iliac bones, and bilateral lower thoracic ribs. Conus medullaris and cauda equina: Conus extends to the L1-L2 level. Patchy abnormal signal within the visualized lower thoracic cord. Clumping of the proximal cauda equina nerve roots due to downstream stenosis. Paraspinal and other soft tissues: Negative. Disc levels: T10-T11: Right lateral and anterolateral disc osteophyte complex. No stenosis. T10-T11: Right lateral disc osteophyte complex.  No stenosis. T12-L1:  Negative. L1-L2:  Negative. L2-L3: Diffuse disc bulge with superimposed large central disc protrusion and mild left greater than right facet arthropathy resulting in severe central spinal canal stenosis. No neuroforaminal stenosis. L3-L4: Diffuse disc  bulge with superimposed central and left paracentral disc protrusion. Mild bilateral facet arthropathy. Severe central spinal canal stenosis and moderate left neuroforaminal stenosis. L4-L5: Diffuse disc bulge, slightly asymmetric to the left. Moderate bilateral facet arthropathy. Severe bilateral lateral recess stenosis. Moderate central spinal canal stenosis. Mild to moderate bilateral neuroforaminal stenosis. L5-S1: Diffuse disc bulge with superimposed central disc protrusion. Mild narrowing of the bilateral lateral recesses. Disc material abuts the descending conjoined left S1 and S2 nerve roots. Mild left and severe right neuroforaminal stenosis. IMPRESSION: 1. Diffusely heterogeneous marrow signal throughout the visualized thoracolumbar spine, pelvis, and lower thoracic ribs, concerning for multiple myeloma or metastatic disease. 2. Patchy, abnormal signal throughout the visualized lower thoracic spinal cord, nonspecific. Recommend further evaluation with MRI of the cervical and thoracic spine with and without contrast. 3. Multilevel degenerative changes  throughout the lumbar spine with severe central spinal canal stenosis at L2-L3 and L3-L4, likely accounting for the patient's symptoms. 4. Severe right neuroforaminal stenosis at L5-S1. Moderate left neuroforaminal stenosis at L3-L4. Electronically Signed   By: Titus Dubin M.D.   On: 03/21/2017 16:27   Mr Cervical Spine W Or Wo Contrast  Result Date: 03/21/2017 CLINICAL DATA:  Bilateral lower extremity weakness. Urinary incontinence. Abnormal distal spinal cord on lumbar spine MRI today. EXAM: MRI CERVICAL AND THORACIC SPINE WITHOUT AND WITH CONTRAST TECHNIQUE: Multiplanar and multiecho pulse sequences of the cervical spine, to include the craniocervical junction and cervicothoracic junction, and thoracic spine, were obtained without and with intravenous contrast. CONTRAST:  8m MULTIHANCE GADOBENATE DIMEGLUMINE 529 MG/ML IV SOLN COMPARISON:  Lumbar spine MRI today FINDINGS: MRI CERVICAL SPINE FINDINGS The study is moderately motion degraded. Alignment: Cervical spine straightening.  No significant listhesis. Vertebrae: Diffuse heterogeneous replacement of normal marrow signal throughout the spine as described on prior lumbar MRI. No dominant enhancing lesion. Preserved vertebral body heights without evidence of fracture. Cord: There is abnormal T2 hyperintensity in the right lateral spinal cord at C3-4 extending over a length of approximately 1.5 cm. Smaller areas of abnormal T2 signal are present at the superior C6 endplate level and at the C6-7 disc space level, with the latter demonstrating enhancement. Posterior Fossa, vertebral arteries, paraspinal tissues: Discrete 4 mm T2 hyperintense nonenhancing lesion in the medulla. Additional T2 hyperintense lesions in the pons and left cerebellum. Disc levels: The cervical spinal canal is small in caliber diffusely on a congenital basis. C2-3: Negative. C3-4: Shallow central disc protrusion without significant stenosis. C4-5: Mild disc bulging and  uncovertebral spurring result in mild spinal stenosis and moderate bilateral neural foraminal stenosis. C5-6: Broad-based posterior disc osteophyte complex results in moderate spinal stenosis with mild cord flattening and moderate to severe bilateral neural foraminal stenosis. C6-7: Broad-based posterior disc osteophyte complex and central disc extrusion result in severe spinal stenosis with moderate cord flattening and severe bilateral neural foraminal stenosis. C7-T1: Mild uncovertebral spurring results in at most mild bilateral neural foraminal stenosis. No spinal stenosis. MRI THORACIC SPINE FINDINGS The study is mildly motion degraded. Alignment: Normal. Vertebrae: Diffuse heterogeneous replacement of normal marrow signal throughout the spine and ribs as well as visualized clavicles and sternum. No dominant enhancing lesion. Preserved vertebral body heights without evidence of fracture. Cord: As seen on today's earlier lumbar MRI, there is patchy T2 hyperintensity in the distal thoracic spinal cord, with the most discrete lesion being on the left at T12. There is a small T2 hyperintense focus in the left central cord at T6-7. A small lesion versus artifact is questioned at  T3. There is the suggestion of focal enhancement along the midline dorsal cord at T5 on axial images (series 21, image 21), however this is not confirmed on sagittal images. Paraspinal and other soft tissues: Unremarkable. Disc levels: Endplate spurring results in mild left neural foraminal stenosis at T1-2. A right posterolateral/foraminal disc osteophyte at T2-3 results in mild right neural foraminal stenosis. A shallow left paracentral disc osteophyte complex and left facet hypertrophy at T6-7 result in mild spinal stenosis and mild left neural foraminal stenosis. A slightly larger left paracentral disc osteophyte complex at T7-8 combines with facet hypertrophy to result in mild spinal stenosis. There is at most mild disc bulging more  inferiorly in the thoracic spine without significant stenosis. IMPRESSION: 1. Scattered T2 hyperintense lesions throughout the cervical and thoracic spinal cord as well as in the pons, medulla, and cerebellum. A lesion at C6-7 is enhancing. While some lesions are associated with spinal stenosis and could in isolation reflect spondylotic myelopathy, the presence of lesions elsewhere in the cord and posterior fossa are indicative of a more widespread process such as demyelinating disease. Infection, inflammatory conditions such as sarcoidosis, and metastatic disease are additional considerations. Brain MRI and CSF analysis are recommended. 2. 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 3. Mild spinal stenosis at T6-7 and T7-8 due to disc and facet degeneration. 4. Diffusely abnormal bone marrow signal. Considerations include multiple myeloma, metastatic disease, and other infiltrative/myelofibrotic marrow processes. Electronically Signed   By: [place] Bores M.D.   On: 03/21/2017 21:44   Mr Thoracic Spine W Wo Contrast  Result Date: 03/21/2017 CLINICAL DATA:  Bilateral lower extremity weakness. Urinary incontinence. Abnormal distal spinal cord on lumbar spine MRI today. EXAM: MRI CERVICAL AND THORACIC SPINE WITHOUT AND WITH CONTRAST TECHNIQUE: Multiplanar and multiecho pulse sequences of the cervical spine, to include the craniocervical junction and cervicothoracic junction, and thoracic spine, were obtained without and with intravenous contrast. CONTRAST:  31m MULTIHANCE GADOBENATE DIMEGLUMINE 529 MG/ML IV SOLN COMPARISON:  Lumbar spine MRI today FINDINGS: MRI CERVICAL SPINE FINDINGS The study is moderately motion degraded. Alignment: Cervical spine straightening.  No significant listhesis. Vertebrae: Diffuse heterogeneous replacement of normal marrow signal throughout the spine as described on prior lumbar MRI. No dominant enhancing lesion. Preserved  vertebral body heights without evidence of fracture. Cord: There is abnormal T2 hyperintensity in the right lateral spinal cord at C3-4 extending over a length of approximately 1.5 cm. Smaller areas of abnormal T2 signal are present at the superior C6 endplate level and at the C6-7 disc space level, with the latter demonstrating enhancement. Posterior Fossa, vertebral arteries, paraspinal tissues: Discrete 4 mm T2 hyperintense nonenhancing lesion in the medulla. Additional T2 hyperintense lesions in the pons and left cerebellum. Disc levels: The cervical spinal canal is small in caliber diffusely on a congenital basis. C2-3: Negative. C3-4: Shallow central disc protrusion without significant stenosis. C4-5: Mild disc bulging and uncovertebral spurring result in mild spinal stenosis and moderate bilateral neural foraminal stenosis. C5-6: Broad-based posterior disc osteophyte complex results in moderate spinal stenosis with mild cord flattening and moderate to severe bilateral neural foraminal stenosis. C6-7: Broad-based posterior disc osteophyte complex and central disc extrusion result in severe spinal stenosis with moderate cord flattening and severe bilateral neural foraminal stenosis. C7-T1: Mild uncovertebral spurring results in at most mild bilateral neural foraminal stenosis. No spinal stenosis. MRI THORACIC SPINE FINDINGS The study is mildly motion degraded. Alignment: Normal. Vertebrae: Diffuse heterogeneous replacement of  normal marrow signal throughout the spine and ribs as well as visualized clavicles and sternum. No dominant enhancing lesion. Preserved vertebral body heights without evidence of fracture. Cord: As seen on today's earlier lumbar MRI, there is patchy T2 hyperintensity in the distal thoracic spinal cord, with the most discrete lesion being on the left at T12. There is a small T2 hyperintense focus in the left central cord at T6-7. A small lesion versus artifact is questioned at T3. There is  the suggestion of focal enhancement along the midline dorsal cord at T5 on axial images (series 21, image 21), however this is not confirmed on sagittal images. Paraspinal and other soft tissues: Unremarkable. Disc levels: Endplate spurring results in mild left neural foraminal stenosis at T1-2. A right posterolateral/foraminal disc osteophyte at T2-3 results in mild right neural foraminal stenosis. A shallow left paracentral disc osteophyte complex and left facet hypertrophy at T6-7 result in mild spinal stenosis and mild left neural foraminal stenosis. A slightly larger left paracentral disc osteophyte complex at T7-8 combines with facet hypertrophy to result in mild spinal stenosis. There is at most mild disc bulging more inferiorly in the thoracic spine without significant stenosis. IMPRESSION: 1. Scattered T2 hyperintense lesions throughout the cervical and thoracic spinal cord as well as in the pons, medulla, and cerebellum. A lesion at C6-7 is enhancing. While some lesions are associated with spinal stenosis and could in isolation reflect spondylotic myelopathy, the presence of lesions elsewhere in the cord and posterior fossa are indicative of a more widespread process such as demyelinating disease. Infection, inflammatory conditions such as sarcoidosis, and metastatic disease are additional considerations. Brain MRI and CSF analysis are recommended. 2. 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 3. Mild spinal stenosis at T6-7 and T7-8 due to disc and facet degeneration. 4. Diffusely abnormal bone marrow signal. Considerations include multiple myeloma, metastatic disease, and other infiltrative/myelofibrotic marrow processes. Electronically Signed   By: [place] Bores M.D.   On: 03/21/2017 21:44   Dg Fluoro Guide Lumbar Puncture  Result Date: 03/22/2017 CLINICAL DATA:  Lower extremity weakness. Spinal cord lesions on MRI. Severe multilevel lumbar  stenosis. EXAM: DIAGNOSTIC LUMBAR PUNCTURE UNDER FLUOROSCOPIC GUIDANCE FLUOROSCOPY TIME:  Fluoroscopy Time:  0 minutes, 42 seconds Radiation Exposure Index (if provided by the fluoroscopic device): 8.9 mGy Number of Acquired Spot Images: 0 PROCEDURE: I discussed the risks (including hemorrhage, infection, headache, and nerve damage, among others), benefits, and alternatives to fluoroscopically guided lumbar puncture with the patient. We specifically discussed the high technical likelihood of success of the procedure. The patient understood and elected to undergo the procedure. Prior to the procedure I reviewed of the patient's lumbar spine MRI and determined that L5-S1 was the optimum level. Standard time-out was employed. Following sterile skin prep and local anesthetic administration consisting of 1 percent lidocaine, a 20 gauge spinal needle was advanced without difficulty into the thecal sac at the at the L5-S1 level from a slightly paramedian approach. Clear CSF was returned. Opening pressure was not obtained. 15 cc of clear CSF was collected. The needle was subsequently removed and the skin cleansed and bandaged. No immediate complications were observed. IMPRESSION: 1. Technically successful fluoroscopically lumbar puncture at the L5-S1 level yielding 15 cc of clear CSF. Electronically Signed   By: Van Clines M.D.   On: 03/22/2017 17:10    Impression/Plan   52 y.o. male who presented to ER with inability to move BLE which has since  improved per patient although continues to endorse weakness of BLE with R>L. Neuro deficits listed as above with obvious right lower extremity weakness.  Imaging to date has been reviewed. There are scattered T2 hyperintense lesions throughout cervical, thoracic and lumbar spine. Cervical MRI: Diffuse degenerative changes most significant for severe spinal stenosis at C6-7 and moderate spinal stenosis at C5-6 Thoracic MRI: Diffuse degenerative changes most  significant for spinal stenosis at and T6-7 and T7-9. Lumbar MRI: diffuse degenerative changes most significant for severe central spinal canal stenosis from L2-4, severe right foraminal stenosis at L5-S1 and moderate left foraminal stenosis at L3-4.  Neuro is in the process of working up the diffuse cord lesions.  Does have some severe degenerative findings as above which could also be contributing to symptoms. Will need to undergo multi-level cervical, thoracic and lumbar decompression, but will need to be done in stages. Case reviewed with attending, Dr Kathyrn Sheriff who will determine surgical intervention which will likely be acdf and thoracic laminectomy initially.

## 2017-03-23 NOTE — Progress Notes (Signed)
PROGRESS NOTE  Dylan [name] MWN:[id number] DOB: [birth year] DOA: 03/21/2017 PCP: Patient follows with an NP at his work  HPI/Recap of past 24 hours: Patient is a 52 year old male with past medical history of diabetes mellitus, hypertension and morbid obesity who started 7 months ago noted problems with right-sided weakness, his leg more than his arm. Initially it was mild, but over time had progressively gotten weaker and weaker. He did not see a doctor for this.  He noted that the more time that he spent on his feet, but weaker his legs would get. On 2/17, patient Could not stand up and rather than follow, he is himself to the ground and called paramedics. He then came into the emergency room.  In the emergency room, vital signs, lab work and chest x-ray were unremarkable. An MRI of the lumbar spine noted diffuse heterogeneous marrow signal abnormalities throughout the spine and pelvis and lower thoracic ribs concerning for multiple myeloma or metastatic disease as well as severe canal stenosis. Patient then underwent a cervical and thoracic MRI which noted diffusely abnormal bone marrow signals and scattered lesions throughout the cervical and thoracic cord as well as in the visualized pollens, medullary and cerebellum.  Neurology was consulted with a broad differential diagnosis including demyelinating disease or an abnormal oncologic process like myeloma. Hospitalist called for further evaluation and admission.  Following admission, HIV titer negative. Patient started on IV steroids. Neurology reviewed findings of MRI of spine with neurosurgery. It is now felt that the patient's lesions seen on MRI are consistent with MS, however his acute neurological weakness is more consistent with severe spinal stenosis at multiple levels. Neurosurgery formally consulted and planned to meet with patient for discussion about multiple decompression surgeries over. If time. Patient himself slightly overwhelmed by  all of this information, still complains of weakness on his right side. Otherwise no complaints.  Assessment/Plan: Principal Problem:   Acute right-sided and lower extremity weakness felt to be secondary to multiple areas of severe spinal stenosis:  Awaiting formal neurosurgical evaluation. Plan is likely going to be multiple decompression surgeries.   Findings of lesions in the CNS suspected to be secondary to multiple sclerosis: Neurology on board.HIV titer normal. (MRI of the brain pending not able to be done until this evening, 48 hours after last gadolinium given) status post lumbar puncture, culture still pending. Have started IV steroids.  On IV steroids  Active Problems:    Diabetes mellitus type 2, uncontrolled: A1c back at 8.3. Holding metformin. Started low-dose Lantus given that he is on steroids.     Hypertension: on ACE inhibitor   Acute kidney injury: Mild. Creatinine on admission of 0.88, at 1.2 this morning.  Likely secondary to contrast. Normalized by 2/19    Morbid obesity [facility]): Patient needs criteria with BMI greater than 35+ hypertension and diabetes mellitus   Code Status: None  Family Communication:  family at the bedside   Disposition Plan: Depending on plans for neurosurgery    Consultants:  Neurology  Interventional radiology    neurosurgery  Procedures:  Lumbar puncture done 2/18   Antimicrobials:  None   DVT prophylaxis:  SCDs    Objective: Vitals:   03/23/17 0010 03/23/17 0339 03/23/17 0744 03/23/17 1137  BP: 119/77 (!) 143/71 (!) 144/81 123/65  Pulse: 100 82 94 95  Resp: 18 18 18 20   Temp: 98.9 F (37.2 C) 98.6 F (37 C) 99.6 F (37.6 C) 99 F (37.2 C)  TempSrc: Oral Oral Oral  Oral  SpO2: 97% 97% 97% 99%  Weight:      Height:        Intake/Output Summary (Last 24 hours) at 03/23/2017 1305 Last data filed at 03/23/2017 0339 Gross per 24 hour  Intake 480 ml  Output 1430 ml  Net -950 ml   Filed Weights   03/21/17 1713  03/22/17 0839  Weight: (!) 158.8 kg (350 lb) 130.9 kg (288 lb 9.3 oz)   Body mass index is 35.13 kg/m.  Exam:   General:  Alert and oriented 3, no acute distress  HEENT: Normocephalic management, mucous members are moist, cranial nerves II through XII are intact  Neck: Supple, no JVD, no carotid bruits   Cardiovascular: regular rate and rhythm, S1-S2, occasional ectopic beat   Respiratory: clear to auscultation bilaterally, breathing not labored   Abdomen: soft, nontender, nondistended, positive bowel sounds   Musculoskeletal: no clubbing or cyanosis or edema   Skin: no skin breaks, tears or lesions  Psychiatry: appropriate, no evidence of psychoses  Neuro: Right side 4+/5  in terms of grip, flexion and extension slightly weaker than left 5/5 lower extremities note near symmetric, 5 minus/5   Data Reviewed: CBC: Recent Labs  Lab 03/21/17 0853 03/22/17 0152  WBC 8.2 7.5  NEUTROABS 6.0 4.5  HGB 13.9 13.4  HCT 39.5 38.6*  MCV 85.5 87.1  PLT 258 591   Basic Metabolic Panel: Recent Labs  Lab 03/21/17 0853 03/22/17 0152 03/23/17 0427  NA 140 140 138  K 3.9 3.6 4.2  CL 106 106 105  CO2 25 22 21*  GLUCOSE 179* 252* 283*  BUN 9 8 11   CREATININE 0.84 1.22 0.90  CALCIUM 9.4 8.8* 9.2   GFR: Estimated Creatinine Clearance: 146.6 mL/min (by C-G formula based on SCr of 0.9 mg/dL). Liver Function Tests: Recent Labs  Lab 03/21/17 0853 03/22/17 1618  AST 36  --   ALT 29  --   ALKPHOS 64  --   BILITOT 1.2  --   PROT 7.5  --   ALBUMIN 4.1 4.1   No results for input(s): LIPASE, AMYLASE in the last 168 hours. No results for input(s): AMMONIA in the last 168 hours. Coagulation Profile: No results for input(s): INR, PROTIME in the last 168 hours. Cardiac Enzymes: No results for input(s): CKTOTAL, CKMB, CKMBINDEX, TROPONINI in the last 168 hours. BNP (last 3 results) No results for input(s): PROBNP in the last 8760 hours. HbA1C: Recent Labs    03/22/17 1355   HGBA1C 8.4*   CBG: Recent Labs  Lab 03/22/17 1059 03/22/17 1626 03/22/17 2138 03/23/17 0621 03/23/17 1135  GLUCAP 168* 209* 320* 307* 313*   Lipid Profile: No results for input(s): CHOL, HDL, LDLCALC, TRIG, CHOLHDL, LDLDIRECT in the last 72 hours. Thyroid Function Tests: No results for input(s): TSH, T4TOTAL, FREET4, T3FREE, THYROIDAB in the last 72 hours. Anemia Panel: No results for input(s): VITAMINB12, FOLATE, FERRITIN, TIBC, IRON, RETICCTPCT in the last 72 hours. Urine analysis: No results found for: COLORURINE, APPEARANCEUR, LABSPEC, PHURINE, GLUCOSEU, HGBUR, BILIRUBINUR, KETONESUR, PROTEINUR, UROBILINOGEN, NITRITE, LEUKOCYTESUR Sepsis Labs: @LABRCNTIP (procalcitonin:4,lacticidven:4)  ) Recent Results (from the past 240 hour(s))  Anaerobic culture     Status: None (Preliminary result)   Collection Time: 03/22/17  4:30 PM  Result Value Ref Range Status   Specimen Description CSF  Final   Special Requests NONE  Final   Culture   Final    NO GROWTH < 24 HOURS Performed at [facility], [address] [place], [place] [address]    Report Status PENDING  Incomplete  CSF culture     Status: None (Preliminary result)   Collection Time: 03/22/17  4:30 PM  Result Value Ref Range Status   Specimen Description CSF  Final   Special Requests NONE  Final   Gram Stain   Final    WBC PRESENT, PREDOMINANTLY MONONUCLEAR NO ORGANISMS SEEN CYTOSPIN SMEAR    Culture   Final    NO GROWTH < 24 HOURS Performed at [facility], [place] [address] [place], [place] [address]    Report Status PENDING  Incomplete      Studies: Dg Fluoro Guide Lumbar Puncture  Result Date: 03/22/2017 CLINICAL DATA:  Lower extremity weakness. Spinal cord lesions on MRI. Severe multilevel lumbar stenosis. EXAM: DIAGNOSTIC LUMBAR PUNCTURE UNDER FLUOROSCOPIC GUIDANCE FLUOROSCOPY TIME:  Fluoroscopy Time:  0 minutes, 42 seconds Radiation Exposure Index (if provided by the fluoroscopic device):  8.9 mGy Number of Acquired Spot Images: 0 PROCEDURE: I discussed the risks (including hemorrhage, infection, headache, and nerve damage, among others), benefits, and alternatives to fluoroscopically guided lumbar puncture with the patient. We specifically discussed the high technical likelihood of success of the procedure. The patient understood and elected to undergo the procedure. Prior to the procedure I reviewed of the patient's lumbar spine MRI and determined that L5-S1 was the optimum level. Standard time-out was employed. Following sterile skin prep and local anesthetic administration consisting of 1 percent lidocaine, a 20 gauge spinal needle was advanced without difficulty into the thecal sac at the at the L5-S1 level from a slightly paramedian approach. Clear CSF was returned. Opening pressure was not obtained. 15 cc of clear CSF was collected. The needle was subsequently removed and the skin cleansed and bandaged. No immediate complications were observed. IMPRESSION: 1. Technically successful fluoroscopically lumbar puncture at the L5-S1 level yielding 15 cc of clear CSF. Electronically Signed   By: Van Clines M.D.   On: 03/22/2017 17:10    Scheduled Meds: . B-complex with vitamin C  1 tablet Oral Daily  . insulin aspart  0-5 Units Subcutaneous QHS  . insulin aspart  0-9 Units Subcutaneous TID WC  . insulin glargine  5 Units Subcutaneous QHS  . lisinopril  10 mg Oral Daily  . LORazepam  1 mg Intravenous Once  . pantoprazole  40 mg Oral Daily  . sodium chloride flush  3 mL Intravenous Q12H  . sodium chloride flush  3 mL Intravenous Q12H    Continuous Infusions: . sodium chloride    . methylPREDNISolone (SOLU-MEDROL) injection Stopped (03/23/17 1022)     LOS: 2 days     Annita Brod, MD [facility]  To reach me or the doctor on call, go to: [email or web address] Password [email or web address]  03/23/2017, 1:05 PM

## 2017-03-23 NOTE — Evaluation (Signed)
Physical Therapy Evaluation Patient Details Name: Dylan [name] MRN: [id number] DOB: 17-Feb-1965 Today's Date: 03/23/2017   History of Present Illness  Patient is a 52 y/o male who presents with 7 month hx of progressive weakness, Right>left, s/p 2 falls at home. 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 Cord compression at C5-6 spinal level. DD includes MS, malignancy, sarcoidosis, myelitis. BRain MRI pending. s/p 1/5 doses of Solu-Medrol. PMH includes HTN, DM.  Clinical Impression  Patient presents with generalized weakness, Rt>left, impaired proprioception BLEs, impaired coordination, decreased balance and impaired mobility s/p above. Tolerated standing and taking a few steps to get to chair with Mod-Max A of 2 due to difficulty sequencing/placing LEs and weakness. Pt independent PTA, works, drives and lives alone.  Has supportive neighbors. Pt with 3-4 beats of clonus bil ankles - sustained in right ankle. Reports his functional mobility is worsened since admission. Has had falls at home recently. Would benefit from CIR to maximize independence and mobility prior to return home. Will follow acutely.     Follow Up Recommendations CIR;Supervision for mobility/OOB    Equipment Recommendations  None recommended by PT    Recommendations for Other Services Rehab consult     Precautions / Restrictions Precautions Precautions: Fall Precaution Comments: BLE weakness; clonus Bil ankles Restrictions Weight Bearing Restrictions: No      Mobility  Bed Mobility Overal bed mobility: Needs Assistance Bed Mobility: Rolling;Sidelying to Sit Rolling: Min guard Sidelying to sit: Min guard;HOB elevated       General bed mobility comments: Able to get to EOB with increased effort and use of  rail.  Transfers Overall transfer level: Needs assistance Equipment used: Rolling walker (2 wheeled) Transfers: Sit to/from Stand Sit to Stand: Mod assist;+2 physical assistance;From elevated surface         General transfer comment: Assist of 2 to power to standing with cues for hand placement/technique. Assist with knee/hip extension and cues for WB through BUEs.  Ambulation/Gait Ambulation/Gait assistance: Mod assist;+2 physical assistance Ambulation Distance (Feet): 3 Feet Assistive device: Rolling walker (2 wheeled) Gait Pattern/deviations: Step-to pattern;Narrow base of support;Trunk flexed;Decreased dorsiflexion - right;Decreased step length - left;Decreased step length - right Gait velocity: decreased Gait velocity interpretation: Below normal speed for age/gender General Gait Details: Pt with difficulty sequencing/placing BLEs during stepping, narrow BoS and increased WB through BUEs. Decreased proprioception BLEs, assist with managing right knee due to instability. Difficulty manuevering RW, needed assist with management.   Stairs            Wheelchair Mobility    Modified Rankin (Stroke Patients Only) Modified Rankin (Stroke Patients Only) Pre-Morbid Rankin Score: Slight disability Modified Rankin: Moderately severe disability     Balance Overall balance assessment: Needs assistance;History of Falls Sitting-balance support: Feet supported;Bilateral upper extremity supported Sitting balance-Leahy Scale: Fair Sitting balance - Comments: Pt with severe posterior lean and trunk compensation during AROM of BLEs.    Standing balance support: During functional activity;Bilateral upper extremity supported Standing balance-Leahy Scale: Poor Standing balance comment: Requires BUE support on RW and Mod A for balance. Cues for knee/hip extension and WB through BUEs. Right knee instability noted.                              Pertinent Vitals/Pain Pain  Assessment: No/denies pain    Home Living Family/patient expects to be discharged to:: Private residence Living Arrangements: Alone Available Help at Discharge: Neighbor;Available PRN/intermittently Type of Home: Apartment Home Access: Stairs to enter Entrance Stairs-Rails: None Entrance Stairs-Number of Steps: 1 Home Layout: One level Home Equipment: Walker - 2 wheels;Cane - single point      Prior Function Level of Independence: Independent         Comments: Dietitian; Drives.      Hand Dominance   Dominant Hand: Right    Extremity/Trunk Assessment   Upper Extremity Assessment Upper Extremity Assessment: Defer to OT evaluation    Lower Extremity Assessment Lower Extremity Assessment: LLE deficits/detail;RLE deficits/detail RLE Deficits / Details: Grossly ~2/5 DF/PF, 2+/5 knee extension, 3/5 knee flexion, 3/5 hip flexion. RLE Sensation: decreased proprioception;WNL RLE Coordination: decreased gross motor;decreased fine motor LLE Deficits / Details: Grossly ~4/5 knee extension, 4/5 DF/PF, 3/5 hip flexion LLE Sensation: decreased proprioception;WNL LLE Coordination: decreased gross motor;decreased fine motor       Communication   Communication: No difficulties  Cognition Arousal/Alertness: Awake/alert Behavior During Therapy: WFL for tasks assessed/performed Overall Cognitive Status: Within Functional Limits for tasks assessed                                        General Comments General comments (skin integrity, edema, etc.): Pt with 3-4 beats of sustained clonus bil ankles. Negative babinski bilaterally.    Exercises     Assessment/Plan    PT Assessment Patient needs continued PT services  PT Problem List Decreased strength;Decreased mobility;Decreased coordination;Decreased range of motion;Decreased activity tolerance;Decreased balance       PT Treatment Interventions Functional mobility training;Balance  training;Patient/family education;Gait training;Therapeutic activities;Therapeutic exercise;Neuromuscular re-education;DME instruction    PT Goals (Current goals can be found in the Care Plan section)  Acute Rehab PT Goals Patient Stated Goal: to [facility] to [facility] and be able to walk PT Goal Formulation: With patient Time For Goal Achievement: 04/06/17 Potential to Achieve Goals: Fair    Frequency Min 3X/week   Barriers to discharge Decreased caregiver support lives alone but has support from neighbors    Co-evaluation PT/OT/SLP Co-Evaluation/Treatment: Yes Reason for Co-Treatment: For patient/therapist safety;To address functional/ADL transfers PT goals addressed during session: Mobility/safety with mobility;Balance         AM-PAC PT "6 Clicks" Daily Activity  Outcome Measure Difficulty turning over in bed (including adjusting bedclothes, sheets and blankets)?: None Difficulty moving from lying on back to sitting on the side of the bed? : None Difficulty sitting down on and standing up from a chair with arms (e.g., wheelchair, bedside commode, etc,.)?: Unable Help needed moving to and from a bed to chair (including a wheelchair)?: A Lot Help needed walking in hospital room?: A Lot Help needed climbing 3-5 steps with a railing? : Total 6 Click Score: 14    End of Session Equipment Utilized During Treatment: Gait belt Activity Tolerance: Patient tolerated treatment well Patient left: in chair;with call bell/phone within reach;with chair alarm set Nurse Communication: Mobility status;Need for lift equipment PT Visit Diagnosis: Muscle weakness (generalized) (M62.81);Difficulty in walking, not elsewhere classified (R26.2);Unsteadiness on feet (R26.81);History of falling (Z91.81);Ataxic gait (R26.0)    Time: 9604-5409 PT Time Calculation (min) (ACUTE ONLY): 33 min   Charges:   PT Evaluation $PT Eval Moderate Complexity: 1 Mod     PT G Codes:  Mylo Red, PT,  DPT [phone]    Marcy Panning 03/23/2017, 12:15 PM

## 2017-03-24 ENCOUNTER — Other Ambulatory Visit: Payer: Self-pay | Admitting: Neurosurgery

## 2017-03-24 DIAGNOSIS — G834 Cauda equina syndrome: Secondary | ICD-10-CM

## 2017-03-24 LAB — GLUCOSE, CAPILLARY
GLUCOSE-CAPILLARY: 368 mg/dL — AB (ref 65–99)
GLUCOSE-CAPILLARY: 316 mg/dL — AB (ref 65–99)
Glucose-Capillary: 315 mg/dL — ABNORMAL HIGH (ref 65–99)
Glucose-Capillary: 259 mg/dL — ABNORMAL HIGH (ref 65–99)

## 2017-03-24 LAB — NEUROMYELITIS OPTICA AUTOAB, IGG: NMO-IgG: <1.5 U/mL (ref 0.0–3.0)

## 2017-03-24 LAB — OLIGOCLONAL BANDS, CSF + SERM

## 2017-03-24 MED ORDER — CEFAZOLIN SODIUM 10 G IJ SOLR
3.0000 g | INTRAMUSCULAR | Status: AC
  Administered 2017-03-25: 3 g via INTRAVENOUS
  Filled 2017-03-24 (×2): qty 3000

## 2017-03-24 MED ORDER — CHLORHEXIDINE GLUCONATE CLOTH 2 % EX PADS
6.0000 | MEDICATED_PAD | Freq: Once | CUTANEOUS | Status: DC
  Administered 2017-03-24: 6 via TOPICAL

## 2017-03-24 MED ORDER — INSULIN ASPART 100 UNIT/ML ~~LOC~~ SOLN
0.0000 [IU] | Freq: Three times a day (TID) | SUBCUTANEOUS | Status: DC
  Administered 2017-03-24: 11 [IU] via SUBCUTANEOUS
  Administered 2017-03-24: 20 [IU] via SUBCUTANEOUS
  Administered 2017-03-25: 11 [IU] via SUBCUTANEOUS
  Administered 2017-03-26: 20 [IU] via SUBCUTANEOUS
  Administered 2017-03-26 – 2017-03-27 (×2): 15 [IU] via SUBCUTANEOUS
  Administered 2017-03-27: 7 [IU] via SUBCUTANEOUS
  Administered 2017-03-27: 4 [IU] via SUBCUTANEOUS
  Administered 2017-03-28: 7 [IU] via SUBCUTANEOUS
  Administered 2017-03-28 (×2): 11 [IU] via SUBCUTANEOUS
  Administered 2017-03-29 (×3): 3 [IU] via SUBCUTANEOUS
  Administered 2017-03-30: 7 [IU] via SUBCUTANEOUS
  Administered 2017-03-30 (×2): 15 [IU] via SUBCUTANEOUS
  Administered 2017-03-31: 7 [IU] via SUBCUTANEOUS
  Administered 2017-03-31 – 2017-04-01 (×5): 11 [IU] via SUBCUTANEOUS
  Administered 2017-04-02: 4 [IU] via SUBCUTANEOUS
  Administered 2017-04-02 (×2): 7 [IU] via SUBCUTANEOUS
  Administered 2017-04-03: 11 [IU] via SUBCUTANEOUS
  Administered 2017-04-03: 15 [IU] via SUBCUTANEOUS
  Administered 2017-04-03: 7 [IU] via SUBCUTANEOUS
  Administered 2017-04-04: 20 [IU] via SUBCUTANEOUS
  Administered 2017-04-04: 7 [IU] via SUBCUTANEOUS
  Administered 2017-04-04: 15 [IU] via SUBCUTANEOUS
  Administered 2017-04-05 (×2): 7 [IU] via SUBCUTANEOUS
  Administered 2017-04-05: 4 [IU] via SUBCUTANEOUS
  Administered 2017-04-06: 11 [IU] via SUBCUTANEOUS
  Administered 2017-04-06: 7 [IU] via SUBCUTANEOUS
  Administered 2017-04-06: 20 [IU] via SUBCUTANEOUS
  Administered 2017-04-07: 4 [IU] via SUBCUTANEOUS
  Administered 2017-04-07: 7 [IU] via SUBCUTANEOUS
  Administered 2017-04-07: 4 [IU] via SUBCUTANEOUS
  Administered 2017-04-08 (×2): 7 [IU] via SUBCUTANEOUS
  Administered 2017-04-08: 4 [IU] via SUBCUTANEOUS
  Administered 2017-04-09: 3 [IU] via SUBCUTANEOUS
  Administered 2017-04-09 (×2): 4 [IU] via SUBCUTANEOUS

## 2017-03-24 MED ORDER — INSULIN GLARGINE 100 UNIT/ML ~~LOC~~ SOLN
15.0000 [IU] | Freq: Every day | SUBCUTANEOUS | Status: DC
  Administered 2017-03-24: 15 [IU] via SUBCUTANEOUS
  Filled 2017-03-24: qty 0.15

## 2017-03-24 MED ORDER — CHLORHEXIDINE GLUCONATE CLOTH 2 % EX PADS: 6.0000 | MEDICATED_PAD | Freq: Every day | CUTANEOUS | Status: AC

## 2017-03-24 MED ORDER — CHLORHEXIDINE GLUCONATE CLOTH 2 % EX PADS
6.0000 | MEDICATED_PAD | Freq: Once | CUTANEOUS | Status: AC
  Administered 2017-03-25: 6 via TOPICAL

## 2017-03-24 NOTE — Progress Notes (Signed)
Inpatient Diabetes Program Recommendations  AACE/ADA: New Consensus Statement on Inpatient Glycemic Control (2015)  Target Ranges:  Prepandial:   less than 140 mg/dL      Peak postprandial:   less than 180 mg/dL (1-2 hours)      Critically ill patients:  140 - 180 mg/dL   Lab Results  Component Value Date   GLUCAP 315 (H) 03/24/2017   HGBA1C 8.4 (H) 03/22/2017    Review of Glycemic Control Results for Dylan [name], Dylan [name] (MRN [id number]) as of 03/24/2017 10:50  Ref. Range 03/23/2017 11:35 03/23/2017 16:16 03/23/2017 20:29 03/23/2017 21:16 03/24/2017 06:22  Glucose-Capillary Latest Ref Range: 65 - 99 mg/dL 313 (H) 340 (H)  344 (H) 315 (H)   Diabetes history: Type 2 DM Outpatient Diabetes medicaions: Metformin 500 mg BID Current orders for Inpatient glycemic control: Novolog 0-9 units TIDAC, Lantus 5 Units QHS, Novolog 0-5 Units QHS, Solumedrol 1,000 mg Q24H  Inpatient Diabetes Program Recommendations:    From note on 2/19- "Noted the Solumedrol, most likely contributing to increased blood sugars. Patient needs more basal insulin based on AM FSBS of 313 mg/dL.  1. Would recommend increasing Lantus to 25 Units QHS (130.9 kg X 0.2), with expectations to adjust as necessary.   2. Also, consider adding meal coverage as post prandials are elevated. Recommending Novolog 5 Units TIDAC (when patient consumes >50% of meal). These adjustments will need to be watched carefully if Solumedrol is decreased.   Noted patient did not receive Novolog 0-9 Units correction yesterday 2/19@ 2138 (comments for why listed "other" however, order parameters were met with BS at that time of 340 mg/dL). Text page sent to Dr Wynelle Cleveland.   Thanks, Bronson Curb, MSN, RNC-OB Diabetes Coordinator [phone] (8a-5p)

## 2017-03-24 NOTE — Progress Notes (Signed)
PROGRESS NOTE    Dylan [name]   BTD:[id number]  DOB: [birth year]  DOA: 03/21/2017 PCP: Patient, No Pcp Per   Brief Narrative:  Dylan [name] patient is a 52 year old male with past medical history of diabetes mellitus, hypertension and morbid obesity who started 7 months ago noted problems with right-sided weakness, his leg more than his arm. An MRI of the lumbar spine noted diffuse heterogeneous marrow signal abnormalities throughout the spine and pelvis and lower thoracic ribs concerning for multiple myeloma or metastatic disease as well as severe canal stenosis. Patient then underwent a cervical and thoracic MRI which noted diffusely abnormal bone marrow signals and scattered lesions throughout the cervical and thoracic cord as well as in the visualized pollens, medullary and cerebellum.   Neurology reviewed findings of MRI of spine with neurosurgery. It is now felt that the patient's lesions seen on MRI are consistent with MS, however his acute neurological weakness is more consistent with severe spinal stenosis at multiple levels.   Subjective: Complains of  weakness in feet and legs ROS: no complaints of nausea, vomiting, constipation diarrhea, cough, dyspnea or dysuria. No other complaints.   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 Felecia Shelling   Uncontrolled diabetes mellitus type 2 without complications  -Continue Lantus-dosage increased to 15 units at bedtime as sugars are elevated due to steroids-has been placed on a resistant sliding scale as well -Metformin  is on hold    Hypertension -Continue lisinopril-also on as needed hydralazine    Morbid obesity ([place]) Body mass index is 35.13 kg/m.   DVT prophylaxis: [place] dictation Ds  code Status:   Full code Family Communication:  Disposition Plan: Continue to follow in the hospital Consultants:   Neurology  Neurosurgery Procedures:    Antimicrobials:  Anti-infectives (From admission, onward)   None       Objective: Vitals:   03/23/17 2053 03/24/17 0000 03/24/17 0359 03/24/17 0733  BP: 123/74 124/68 139/77 (!) 142/20  Pulse: 98 81 80 76  Resp: _0 Temp: 98.5 F (36.9 C) 98.5 F (36.9 C) 98.6 F (37 C) 98.6 F (37 C)  TempSrc: Oral Oral Oral Axillary  SpO2: 99% 99% 98% 100%  Weight:      Height:        Intake/Output Summary (Last 24 hours) at 03/24/2017 1117 Last data filed at 03/24/2017 0400 Gross per 24 hour  Intake 418 ml  Output 1025 ml  Net -607 ml   Filed Weights   03/21/17 1713 03/22/17 0839  Weight: (!) 158.8 kg (350 lb) 130.9 kg (288 lb 9.3 oz)    Examination: General exam: Appears comfortable  HEENT: PERRLA, oral mucosa moist, no sclera icterus or thrush Respiratory system: Clear to auscultation. Respiratory effort normal. Cardiovascular system: S1 & S2 heard, RRR.  No murmurs  Gastrointestinal system: Abdomen soft, non-tender, nondistended. Normal bowel sound. No organomegaly Central nervous system: Alert and oriented.  Has 2-3/5 weakness in lower extremities and 3/5 weakness in hands Extremities: No cyanosis, clubbing or edema Skin: No rashes or ulcers Psychiatry:  Mood & affect appropriate.  Data Reviewed: I have personally reviewed following labs and imaging studies  CBC: Recent Labs  Lab 03/21/17 0853 03/22/17 0152  WBC 8.2 7.5  NEUTROABS 6.0 4.5  HGB 13.9 13.4  HCT 39.5 38.6*  MCV 85.5 87.1  PLT 258 409   Basic Metabolic Panel: Recent Labs  Lab 03/21/17 0853 03/22/17 0152 03/23/17 0427  NA 140 140 138  K 3.9 3.6  4.2  CL 106 106 105  CO2 25 22 21*  GLUCOSE 179* 252* 283*  BUN _0 CREATININE 0.84 1.22 0.90  CALCIUM 9.4 8.8* 9.2   GFR: Estimated Creatinine Clearance: 146.6 mL/min (by C-G formula based on SCr of 0.9 mg/dL). Liver Function Tests: Recent Labs  Lab 03/21/17 0853 03/22/17 1618  AST 36  --   ALT 29  --   ALKPHOS 64  --   BILITOT 1.2  --   PROT 7.5  --   ALBUMIN 4.1 4.1   No results for input(s): LIPASE, AMYLASE in the last 168 hours. No results for input(s): AMMONIA in the last 168 hours. Coagulation Profile: No results for input(s): INR, PROTIME in the last 168 hours. Cardiac Enzymes: No results for input(s): CKTOTAL, CKMB, CKMBINDEX, TROPONINI in the last 168 hours. BNP (last 3 results) No results for input(s): PROBNP in the last 8760 hours. HbA1C: Recent Labs    03/22/17 1355  HGBA1C 8.4*   CBG: Recent Labs  Lab 03/23/17 0621 03/23/17 1135 03/23/17 1616 03/23/17 2116 03/24/17 0622  GLUCAP 307* 313* 340* 344* 315*   Lipid Profile: No results for input(s): CHOL, HDL, LDLCALC, TRIG, CHOLHDL, LDLDIRECT in the last 72 hours. Thyroid Function Tests: No results for input(s): TSH, T4TOTAL, FREET4, T3FREE, THYROIDAB in the last 72 hours. Anemia Panel: No results for input(s): VITAMINB12, FOLATE, FERRITIN, TIBC, IRON, RETICCTPCT in the last 72 hours. Urine analysis: No results found for: COLORURINE, APPEARANCEUR, LABSPEC, [place], GLUCOSEU, HGBUR, BILIRUBINUR, KETONESUR, PROTEINUR, UROBILINOGEN, NITRITE, LEUKOCYTESUR Sepsis Labs: _1 (procalcitonin:4,lacticidven:4) ) Recent Results (from the past 240 hour(s))  Anaerobic culture     Status: None (Preliminary result)   Collection Time: 03/22/17  4:30 PM  Result Value Ref Range Status   Specimen Description CSF  Final   Special Requests NONE  Final   Culture   Final    NO GROWTH < 24 HOURS Performed at [facility], [address] [place], [place] [address]    Report Status PENDING  Incomplete   CSF culture     Status: None (Preliminary result)   Collection Time: 03/22/17  4:30 PM  Result Value Ref Range Status   Specimen Description CSF  Final   Special Requests NONE  Final   Gram Stain   Final    WBC PRESENT, PREDOMINANTLY MONONUCLEAR NO ORGANISMS SEEN CYTOSPIN SMEAR    Culture   Final    NO GROWTH < 24 HOURS Performed at [facility], [place] [address] [place], [place] [address]    Report Status PENDING  Incomplete         Radiology Studies: Mr Jeri Cos FA Contrast  Result Date: 03/23/2017 CLINICAL DATA:  Progressive RIGHT-sided weakness for 7 months. History of hypertension, diabetes. Follow-up cervical spinal cord lesions. EXAM: MRI HEAD WITHOUT AND WITH CONTRAST TECHNIQUE: Multiplanar, multiecho pulse sequences of the brain and surrounding structures were obtained without and with intravenous contrast. CONTRAST:  28m MULTIHANCE GADOBENATE DIMEGLUMINE 529 MG/ML IV SOLN COMPARISON:  None. FINDINGS: INTRACRANIAL CONTENTS: Faint reduced diffusion bilateral occipital lobes, extending toward  splenium of corpus callosum with normalized or increased diffusivity. At least 5 subcentimeter posterior fossa lesions. 4 mm T2 bright lesion ventral spinal cord. Far greater than 10 supratentorial white matter lesions, some of which correspond to diffusion abnormality, many of which radiate from the peri ventricular margin with low T1 signal seen with black holes of demyelination. Dominant lesion LEFT frontal periventricular white matter measuring 18 mm. Additional juxta cortical and deep white matter lesions. Two 9 mm lesions LEFT thalamus extending to LEFT hypothalamus. 4 mm lesion RIGHT basal ganglia mild parenchymal brain volume loss. No hydrocephalus. Faint marginal enhancement bilateral occipital lobe lesion (coronal 8/28 and 4/28 post contrast T1). No susceptibility artifact to suggest hemorrhage. No midline shift, mass effect. No abnormal extra-axial fluid collections or  extra-axial enhancement. VASCULAR: Normal major intracranial vascular flow voids present at skull base. SKULL AND UPPER CERVICAL SPINE: No abnormal sellar expansion. No suspicious calvarial bone marrow signal. Craniocervical junction maintained. SINUSES/ORBITS: The mastoid air-cells and included paranasal sinuses are well-aerated.T2 bright signal LEFT optic nerve intracanalicular segment suspected, no definite enhancement though limited assessment. OTHER: None. IMPRESSION: 1. At least 5 infratentorial and greater than 10 supratentorial lesions consistent with chronic demyelination, with acute to subacute enhancing lesions bilateral occipital lobes. Subcentimeter LEFT thalamus and RIGHT basal ganglia demyelinating plaques. 2. Probable LEFT optic neuritis. 3. Mild parenchymal brain volume loss for age. Electronically Signed   By: Elon Alas M.D.   On: 03/23/2017 22:20   Dg Fluoro Guide Lumbar Puncture  Result Date: 03/22/2017 CLINICAL DATA:  Lower extremity weakness. Spinal cord lesions on MRI. Severe multilevel lumbar stenosis. EXAM: DIAGNOSTIC LUMBAR PUNCTURE UNDER FLUOROSCOPIC GUIDANCE FLUOROSCOPY TIME:  Fluoroscopy Time:  0 minutes, 42 seconds Radiation Exposure Index (if provided by the fluoroscopic device): 8.9 mGy Number of Acquired Spot Images: 0 PROCEDURE: I discussed the risks (including hemorrhage, infection, headache, and nerve damage, among others), benefits, and alternatives to fluoroscopically guided lumbar puncture with the patient. We specifically discussed the high technical likelihood of success of the procedure. The patient understood and elected to undergo the procedure. Prior to the procedure I reviewed of the patient's lumbar spine MRI and determined that L5-S1 was the optimum level. Standard time-out was employed. Following sterile skin prep and local anesthetic administration consisting of 1 percent lidocaine, a 20 gauge spinal needle was advanced without difficulty into the thecal  sac at the at the L5-S1 level from a slightly paramedian approach. Clear CSF was returned. Opening pressure was not obtained. 15 cc of clear CSF was collected. The needle was subsequently removed and the skin cleansed and bandaged. No immediate complications were observed. IMPRESSION: 1. Technically successful fluoroscopically lumbar puncture at the L5-S1 level yielding 15 cc of clear CSF. Electronically Signed   By: Van Clines M.D.   On: 03/22/2017 17:10      Scheduled Meds: . B-complex with vitamin C  1 tablet Oral Daily  . insulin aspart  0-5 Units Subcutaneous QHS  . insulin aspart  0-9 Units Subcutaneous TID WC  . insulin glargine  5 Units Subcutaneous QHS  . lisinopril  10 mg Oral Daily  . LORazepam  1 mg Intravenous Once  . pantoprazole  40 mg Oral Daily  . sodium chloride flush  3 mL Intravenous Q12H  . sodium chloride flush  3 mL Intravenous Q12H   Continuous Infusions: . sodium chloride    . methylPREDNISolone (SOLU-MEDROL) injection 1,000 mg (03/24/17 1020)     LOS: 3 days    Time spent in  minutes: 35    Debbe Odea, MD [facility] Pager: [email or web address] Password [facility] 03/24/2017, 11:17 AM

## 2017-03-24 NOTE — Progress Notes (Signed)
I met with pt at bedside to begin discussions concerning a possible inpt rehab admit after his neurosurgery. We also dicussed goals and expectations. He is overwhelmed but appreciative. I contacted Financial counselor to request Disability and medicaid applications and pt is aware. I will follow his progress. [phone]

## 2017-03-24 NOTE — Progress Notes (Signed)
Patient scheduled for surgery tomorrow at 1230pm. I have discussed surgery at length with patient including risks, benefits and alternatives. In own language, he states understanding of the procedure. All questions were answered. Would like to proceed.  Pre op orders placed.

## 2017-03-24 NOTE — Progress Notes (Signed)
Neurosurgery Progress Note  No issues overnight. Without any concerns yesterday  EXAM:  BP (!) 142/20 (BP Location: Left Arm)   Pulse 76   Temp 98.6 F (37 C) (Axillary)   Resp 18   Ht 6\' 4"  (1.93 m)   Wt 130.9 kg (288 lb 9.3 oz)   SpO2 100%   BMI 35.13 kg/m   Awake, alert, oriented  Speech fluent, appropriate  MAEW with exception of RLE and b/l grip strength  PLAN Stable motor exam today MRI brain reviewed which shows demyelinating lesion.  Will proceed with NS intervention. Will work on getting on OR schedule tomorrow Continue current supportive care

## 2017-03-24 NOTE — Progress Notes (Signed)
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 After discussion with neurology, I have recommended surgical decompression/fusion. I reviewed the details of the surgery with the patient as well as expected postoperative course. We specifically discussed risks of surgery including spinal cord/nerve injury leading to worsening numbness/weakness or paralysis, potentially life-threatening bleeding, stroke, CSF leak, infection. We also discussed possibility of postoperative dysphagia and/or dysphonia and neck hematoma requiring evacuation. We talked about the possible need for additional surgeries in the future. Finally, we discussed possible outcomes including lack of improvement. The patient appeared to understand our discussion and all his questions were answered. He indicated willingness to proceed with surgery tomorrow.

## 2017-03-24 NOTE — Progress Notes (Addendum)
Neurology Progress Note   S:// Seen and examined.  No new complaints. MRI brain completed.   O:// Current vital signs: BP (!) 142/20 (BP Location: Left Arm)   Pulse 76   Temp 98.6 F (37 C) (Axillary)   Resp 18   Ht 6\' 4"  (1.93 m)   Wt 130.9 kg (288 lb 9.3 oz)   SpO2 100%   BMI 35.13 kg/m   Vital signs in last 24 hours: Temp:  [98.5 F (36.9 C)-99 F (37.2 C)] 98.6 F (37 C) (02/20 0733) Pulse Rate:  [76-98] 76 (02/20 0733) Resp:  [17-20] 18 (02/20 0733) BP: (123-142)/(20-77) 142/20 (02/20 0733) SpO2:  [98 %-100 %] 100 % (02/20 0733) Unchanged from yesterday GENERAL: Awake, alert in NAD HEENT: - Normocephalic and atraumatic, dry mm, no LN++, no Thyromegally LUNGS - Clear to auscultation bilaterally with no wheezes CV - S1S2 RRR, no m/r/g, equal pulses bilaterally. ABDOMEN - Soft, nontender, nondistended with normoactive BS Ext: warm, well perfused, intact peripheral pulses, no edema NEURO:  Mental Status: AA&Ox3  Language: speech is not dysarthric.  Naming, repetition, fluency, and comprehension intact. Cranial Nerves: PERRL. EOMI, visual fields full, no facial asymmetry, facial sensation intact, hearing intact, tongue/uvula/soft palate midline, normal sternocleidomastoid and trapezius muscle strength. No evidence of tongue atrophy or fibrillations Motor: 5/5 left upper extremity.  4+/5 right deltoid, biceps and triceps.  3/5 small muscles of the right hand.  2/5 right hip flexors.  3/5 knee flexors and extensors on the right.  3/5 plantar flexion on the right.  2/5 dorsiflexion on the right.  Left lower extremity 4/5 all over. Tone: is normal and bulk is normal Sensation- Intact to light touch bilaterally Coordination: FTN intact bilaterally Gait- deferred Deep tendon reflexes: Hyperreflexic in both knees, sustained clonus in both ankles.  Medications  Current Facility-Administered Medications:  .  0.9 %  sodium chloride infusion, 250 mL, Intravenous, PRN, Opyd,  Lavone Neri, MD .  acetaminophen (TYLENOL) tablet 650 mg, 650 mg, Oral, Q6H PRN **OR** acetaminophen (TYLENOL) suppository 650 mg, 650 mg, Rectal, Q6H PRN, Opyd, Lavone Neri, MD .  B-complex with vitamin C tablet 1 tablet, 1 tablet, Oral, Daily, Opyd, Lavone Neri, MD, 1 tablet at 03/23/17 0912 .  bisacodyl (DULCOLAX) EC tablet 5 mg, 5 mg, Oral, Daily PRN, Opyd, Timothy S, MD .  hydrALAZINE (APRESOLINE) tablet 10 mg, 10 mg, Oral, Q8H PRN, Hollice Espy, MD, 10 mg at 03/23/17 0912 .  HYDROcodone-acetaminophen (NORCO/VICODIN) 5-325 MG per tablet 1-2 tablet, 1-2 tablet, Oral, Q4H PRN, Opyd, Lavone Neri, MD, 2 tablet at 03/22/17 0258 .  insulin aspart (novoLOG) injection 0-5 Units, 0-5 Units, Subcutaneous, QHS, Opyd, Lavone Neri, MD, 4 Units at 03/23/17 2142 .  insulin aspart (novoLOG) injection 0-9 Units, 0-9 Units, Subcutaneous, TID WC, Opyd, Lavone Neri, MD, 7 Units at 03/24/17 0716 .  insulin glargine (LANTUS) injection 5 Units, 5 Units, Subcutaneous, QHS, Hollice Espy, MD, 5 Units at 03/23/17 2139 .  lisinopril (PRINIVIL,ZESTRIL) tablet 10 mg, 10 mg, Oral, Daily, Hollice Espy, MD, 10 mg at 03/23/17 0912 .  LORazepam (ATIVAN) injection 1 mg, 1 mg, Intravenous, Once, Lorre Nick, MD .  methylPREDNISolone sodium succinate (SOLU-MEDROL) 1,000 mg in sodium chloride 0.9 % 50 mL IVPB, 1,000 mg, Intravenous, Daily, Hollice Espy, MD, Stopped at 03/23/17 1022 .  ondansetron (ZOFRAN) tablet 4 mg, 4 mg, Oral, Q6H PRN **OR** ondansetron (ZOFRAN) injection 4 mg, 4 mg, Intravenous, Q6H PRN, Opyd, Lavone Neri, MD .  pantoprazole (PROTONIX)  EC tablet 40 mg, 40 mg, Oral, Daily, Hollice Espy, MD, 40 mg at 03/23/17 0912 .  senna-docusate (Senokot-S) tablet 1 tablet, 1 tablet, Oral, QHS PRN, Opyd, Timothy S, MD .  sodium chloride flush (NS) 0.9 % injection 3 mL, 3 mL, Intravenous, Q12H, Opyd, Lavone Neri, MD, 3 mL at 03/23/17 2148 .  sodium chloride flush (NS) 0.9 % injection 3 mL, 3 mL, Intravenous,  Q12H, Opyd, Lavone Neri, MD, 3 mL at 03/23/17 2147 .  sodium chloride flush (NS) 0.9 % injection 3 mL, 3 mL, Intravenous, PRN, Briscoe Deutscher, MD Labs CBC    Component Value Date/Time   WBC 7.5 03/22/2017 0152   RBC 4.43 03/22/2017 0152   HGB 13.4 03/22/2017 0152   HCT 38.6 (L) 03/22/2017 0152   PLT 238 03/22/2017 0152   MCV 87.1 03/22/2017 0152   MCH 30.2 03/22/2017 0152   MCHC 34.7 03/22/2017 0152   RDW 12.8 03/22/2017 0152   LYMPHSABS 2.3 03/22/2017 0152   MONOABS 0.6 03/22/2017 0152   EOSABS 0.1 03/22/2017 0152   BASOSABS 0.0 03/22/2017 0152    CMP     Component Value Date/Time   NA 138 03/23/2017 0427   K 4.2 03/23/2017 0427   CL 105 03/23/2017 0427   CO2 21 (L) 03/23/2017 0427   GLUCOSE 283 (H) 03/23/2017 0427   BUN 11 03/23/2017 0427   CREATININE 0.90 03/23/2017 0427   CALCIUM 9.2 03/23/2017 0427   PROT 7.5 03/21/2017 0853   ALBUMIN 4.1 03/22/2017 1618   AST 36 03/21/2017 0853   ALT 29 03/21/2017 0853   ALKPHOS 64 03/21/2017 0853   BILITOT 1.2 03/21/2017 0853   GFRNONAA >60 03/23/2017 0427   GFRAA >60 03/23/2017 0427    Imaging I have reviewed images in epic and the results pertinent to this consultation are: I have reviewed images in epic and the results pertinent to this consultation are: MRI examination of the C-spine shows scattered T2 hyperintense lesions throughout the cervical and thoracic cord as well as in the pons, medulla and cerebellum.  There is severe spinal stenosis at C6-7 with abnormal cord signal.  There is also enhancement with contrast seen at C6-7, which could be secondary to the compressive myelopathy.  No other enhancement.  There is diffusely abnormal bone marrow signal all throughout his spine.  Multiple levels of degenerative disc disease.  There is also spinal stenosis at T6-7 and T7-8 levels.  MRI of the brain shows numerous supratentorial and infratentorial T2 hyperintensities along with some faint enhancement with possible left optic  neuritis-all suggestive of demyelination such as multiple sclerosis.  CSF analysis shows 11 cells, increased IgG index again pointing towards demyelinating process. ACE normal NMO ab - pending  Assessment:  52 year old man past history of diabetes hypertension and progressive right leg weakness presented to ER for severe worsening of bilateral leg weakness-right more than left. Examination has myelopathic features including hyperreflexia and clonus. His pattern of weakness does point to a cervical spine pathology. Cervical spine imaging, in addition to showing intramedullary demyelinating-looking lesions also has evidence of cord compression at C5-6 level.  Thoracic spine imaging also shows multiple scattered T2 hyperintensities consistent with demyelination but there is also multilevel compressive elements. Brain MRI which has recently been done shows lesions consistent with a demyelinating process. Given his history of progressive weakness, an episode of visual loss when he was 42, and imaging findings, he has at this time 2 processes going on- demyelinating process affecting the brain  and the spinal cord (such as multiple sclerosis or neuromyelitis optica) and multilevel cervical and thoracic spinal stenosis causing myelopathy. For the demyelinating disease, MS is more likely as his spinal lesions are not longitudinally extensive and atypical for neuro myelitis optica.  Impression: Demyelinating disease (MS vs NMO) Spinal stenosis - cervical and thoracic leading to myelopathy  Recommendations: -Continue with 5 days of steroids. -Decompression of the spinal stenosis per neurosurgery.  Dr. Conchita Paris on board.  Appreciate recommendations and follow-up. -Outpatient follow-up with Dr. Epimenio Foot at [facility] neurology for recommendations on disease modifying treatment for his demyelinating disease.  Follow-up on NMO antibody result which has been ordered and is currently pending. -Physical therapy  occupational therapy speech therapy  Neurology will be available as needed.  Please call with questions.  -- Milon Dikes, MD [facility] Pager: [phone] If 7pm to 7am, please call on call as listed on AMION.

## 2017-03-24 NOTE — Progress Notes (Signed)
Physical Therapy Treatment Patient Details Name: Dylan [name] MRN: [id number] DOB: 09-02-1965 Today's Date: 03/24/2017    History of Present Illness Patient is a 52 y/o male who presents with 7 month hx of progressive weakness, Right>left, s/p 2 falls at home. 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 Cord compression at C5-6 spinal level. MRI are consistent with MS, however his acute neurological weakness is more consistent with severe spinal stenosis at multiple levels.  s/p 1/5 doses of Solu-Medrol. PMH includes HTN, DM.    PT Comments    Pt making slow progress towards his goals. Pt currently min [name] for bed mobility, pt unable to powerup to RW with maxAx2 however able to pull to standing with Stedy with modAx2 and maintain standing balance with minA for 2 minutes. Pt will benefit from continued skilled acute rehab to progress safe mobility for d/c to CIR following spinal surgery.    Follow Up Recommendations  CIR;Supervision for mobility/OOB     Equipment Recommendations  None recommended by PT    Recommendations for Other Services       Precautions / Restrictions Precautions Precautions: Fall Precaution Comments: BLE weakness; clonus Bil ankles Restrictions Weight Bearing Restrictions: No    Mobility  Bed Mobility Overal bed mobility: Needs Assistance Bed Mobility: Rolling;Sidelying to Sit Rolling: Min [name] Sidelying to sit: Min [name];HOB elevated       General bed mobility comments: Able to get to EOB with increased effort and use of rail.  Transfers Overall transfer level: Needs assistance Equipment used: Rolling walker (2 wheeled) Transfers: Sit to/from Stand Sit to Stand: +2 physical assistance;From elevated surface;Mod assist;Max assist         General transfer  comment: MaxAx2 for 2 attempts of sit>stand to RW, unable to power up and anteriorly rotate pelvis to come into standing, with Stedy  pt able to pull him self into standing with modAx2     Balance Overall balance assessment: Needs assistance;History of Falls Sitting-balance support: Feet supported;Bilateral upper extremity supported Sitting balance-Leahy Scale: Fair Sitting balance - Comments: Pt with severe posterior lean and trunk compensation during AROM of BLEs.    Standing balance support: During functional activity;Bilateral upper extremity supported Standing balance-Leahy Scale: Poor Standing balance comment: Requires BUE support on RW and Mod A for balance. Cues for knee/hip extension and WB through BUEs. Right knee instability noted.                             Cognition Arousal/Alertness: Awake/alert Behavior During Therapy: WFL for tasks assessed/performed Overall Cognitive Status: Within Functional Limits for tasks assessed                                        Exercises Other Exercises Other Exercises: sit>stand x3 in [place] with standing 1-2 minutes with minA for steadying,     General Comments        Pertinent Vitals/Pain Pain Assessment: No/denies pain           PT Goals (current goals can now be found in the care plan section) Acute Rehab PT Goals Patient Stated Goal: to return to PLOF and be able to walk PT Goal Formulation:  With patient Time For Goal Achievement: 04/06/17 Potential to Achieve Goals: Fair Progress towards PT goals: Progressing toward goals    Frequency    Min 3X/week      PT Plan Current plan remains appropriate       AM-PAC PT "6 Clicks" Daily Activity  Outcome Measure  Difficulty turning over in bed (including adjusting bedclothes, sheets and blankets)?: None Difficulty moving from lying on back to sitting on the side of the bed? : A Little Difficulty sitting down on and standing up from a  chair with arms (e.g., wheelchair, bedside commode, etc,.)?: Unable Help needed moving to and from a bed to chair (including a wheelchair)?: A Lot Help needed walking in hospital room?: Total Help needed climbing 3-5 steps with a railing? : Total 6 Click Score: 12    End of Session Equipment Utilized During Treatment: Gait belt Activity Tolerance: Patient tolerated treatment well Patient left: in chair;with call bell/phone within reach;with chair alarm set Nurse Communication: Mobility status PT Visit Diagnosis: Muscle weakness (generalized) (M62.81);Difficulty in walking, not elsewhere classified (R26.2);Unsteadiness on feet (R26.81);History of falling (Z91.81);Ataxic gait (R26.0)     Time: 1450-1505 PT Time Calculation (min) (ACUTE ONLY): 15 min  Charges:  $Therapeutic Activity: 8-22 mins                    G Codes:       Dylan [name] B. Beverely Risen PT, DPT Acute Rehabilitation  [phone] Pager [phone]     Dylan [name] Fleet 03/24/2017, 5:22 PM

## 2017-03-25 ENCOUNTER — Inpatient Hospital Stay (HOSPITAL_COMMUNITY): Payer: Medicaid Other | Admitting: Certified Registered"

## 2017-03-25 ENCOUNTER — Inpatient Hospital Stay (HOSPITAL_COMMUNITY): Payer: Medicaid Other

## 2017-03-25 ENCOUNTER — Inpatient Hospital Stay (HOSPITAL_COMMUNITY)
Admission: EM | Disposition: A | Discharge status: Skilled Nursing Facility | Payer: Self-pay | Source: Home / Self Care | Attending: Internal Medicine

## 2017-03-25 ENCOUNTER — Encounter (HOSPITAL_COMMUNITY): Payer: Self-pay | Admitting: Surgery

## 2017-03-25 HISTORY — PX: ANTERIOR CERVICAL DECOMP/DISCECTOMY FUSION: SHX1161

## 2017-03-25 LAB — BASIC METABOLIC PANEL
Anion gap: 10 (ref 5–15)
BUN: 16 mg/dL (ref 6–20)
CALCIUM: 8.8 mg/dL — AB (ref 8.9–10.3)
CO2: 22 mmol/L (ref 22–32)
CREATININE: 0.97 mg/dL (ref 0.61–1.24)
Chloride: 105 mmol/L (ref 101–111)
Glucose, Bld: 277 mg/dL — ABNORMAL HIGH (ref 65–99)
Potassium: 4 mmol/L (ref 3.5–5.1)
Sodium: 137 mmol/L (ref 135–145)

## 2017-03-25 LAB — GLUCOSE, CAPILLARY
Glucose-Capillary: 281 mg/dL — ABNORMAL HIGH (ref 65–99)
Glucose-Capillary: 162 mg/dL — ABNORMAL HIGH (ref 65–99)
Glucose-Capillary: 193 mg/dL — ABNORMAL HIGH (ref 65–99)
Glucose-Capillary: 392 mg/dL — ABNORMAL HIGH (ref 65–99)

## 2017-03-25 LAB — TYPE AND SCREEN
ABO/RH(D): B POS
ANTIBODY SCREEN: NEGATIVE

## 2017-03-25 LAB — SURGICAL PCR SCREEN
MRSA, PCR: NEGATIVE
Staphylococcus aureus: POSITIVE — AB

## 2017-03-25 LAB — ABO/RH: ABO/RH(D): B POS

## 2017-03-25 SURGERY — ANTERIOR CERVICAL DECOMPRESSION/DISCECTOMY FUSION 2 LEVELS
Anesthesia: General | Site: Spine Cervical

## 2017-03-25 MED ORDER — SODIUM CHLORIDE 0.9% FLUSH
3.0000 mL | Freq: Two times a day (BID) | INTRAVENOUS | Status: DC
  Administered 2017-03-27 – 2017-03-29 (×3): 3 mL via INTRAVENOUS

## 2017-03-25 MED ORDER — ONDANSETRON HCL 4 MG/2ML IJ SOLN
INTRAMUSCULAR | Status: AC
  Filled 2017-03-25: qty 2

## 2017-03-25 MED ORDER — ROCURONIUM BROMIDE 10 MG/ML (PF) SYRINGE
PREFILLED_SYRINGE | INTRAVENOUS | Status: AC
  Filled 2017-03-25: qty 5

## 2017-03-25 MED ORDER — LIDOCAINE-EPINEPHRINE 1 %-1:100000 IJ SOLN
INTRAMUSCULAR | Status: DC | PRN
  Administered 2017-03-25: 5 mL

## 2017-03-25 MED ORDER — PROPOFOL 10 MG/ML IV BOLUS
INTRAVENOUS | Status: AC
  Filled 2017-03-25: qty 20

## 2017-03-25 MED ORDER — THROMBIN (RECOMBINANT) 5000 UNITS EX SOLR
OROMUCOSAL | Status: DC | PRN
  Administered 2017-03-25: 5 mL via TOPICAL

## 2017-03-25 MED ORDER — PHENYLEPHRINE 40 MCG/ML (10ML) SYRINGE FOR IV PUSH (FOR BLOOD PRESSURE SUPPORT)
PREFILLED_SYRINGE | INTRAVENOUS | Status: AC
  Filled 2017-03-25: qty 10

## 2017-03-25 MED ORDER — FENTANYL CITRATE (PF) 100 MCG/2ML IJ SOLN
INTRAMUSCULAR | Status: DC | PRN
  Administered 2017-03-25 (×2): 50 ug via INTRAVENOUS
  Administered 2017-03-25: 100 ug via INTRAVENOUS

## 2017-03-25 MED ORDER — LIDOCAINE-EPINEPHRINE 1 %-1:100000 IJ SOLN
INTRAMUSCULAR | Status: AC
  Filled 2017-03-25: qty 1

## 2017-03-25 MED ORDER — LIDOCAINE HCL (CARDIAC) 20 MG/ML IV SOLN
INTRAVENOUS | Status: DC | PRN
  Administered 2017-03-25: 100 mg via INTRAVENOUS

## 2017-03-25 MED ORDER — MIDAZOLAM HCL 2 MG/2ML IJ SOLN
INTRAMUSCULAR | Status: AC
  Filled 2017-03-25: qty 2

## 2017-03-25 MED ORDER — METHOCARBAMOL 500 MG PO TABS
500.0000 mg | ORAL_TABLET | Freq: Four times a day (QID) | ORAL | Status: DC | PRN
  Administered 2017-03-25 – 2017-03-29 (×4): 500 mg via ORAL
  Filled 2017-03-25 (×4): qty 1

## 2017-03-25 MED ORDER — FENTANYL CITRATE (PF) 100 MCG/2ML IJ SOLN: 25.0000 ug | INTRAMUSCULAR | Status: DC | PRN

## 2017-03-25 MED ORDER — 0.9 % SODIUM CHLORIDE (POUR BTL) OPTIME
TOPICAL | Status: DC | PRN
  Administered 2017-03-25: 1000 mL

## 2017-03-25 MED ORDER — SUGAMMADEX SODIUM 200 MG/2ML IV SOLN
INTRAVENOUS | Status: DC | PRN
  Administered 2017-03-25: 260 mg via INTRAVENOUS

## 2017-03-25 MED ORDER — FENTANYL CITRATE (PF) 250 MCG/5ML IJ SOLN
INTRAMUSCULAR | Status: AC
  Filled 2017-03-25: qty 5

## 2017-03-25 MED ORDER — SODIUM CHLORIDE 0.9 % IR SOLN
Status: DC | PRN
  Administered 2017-03-25: 500 mL

## 2017-03-25 MED ORDER — THROMBIN 20000 UNITS EX SOLR
CUTANEOUS | Status: AC
  Filled 2017-03-25: qty 20000

## 2017-03-25 MED ORDER — METHOCARBAMOL 1000 MG/10ML IJ SOLN
500.0000 mg | Freq: Four times a day (QID) | INTRAVENOUS | Status: DC | PRN
  Filled 2017-03-25: qty 5

## 2017-03-25 MED ORDER — BUPIVACAINE HCL 0.5 % IJ SOLN
INTRAMUSCULAR | Status: DC | PRN
  Administered 2017-03-25: 5 mL

## 2017-03-25 MED ORDER — SUGAMMADEX SODIUM 500 MG/5ML IV SOLN
INTRAVENOUS | Status: AC
  Filled 2017-03-25: qty 5

## 2017-03-25 MED ORDER — DEXAMETHASONE SODIUM PHOSPHATE 10 MG/ML IJ SOLN
INTRAMUSCULAR | Status: DC | PRN
  Administered 2017-03-25: 5 mg via INTRAVENOUS

## 2017-03-25 MED ORDER — LACTATED RINGERS IV SOLN
INTRAVENOUS | Status: DC | PRN
  Administered 2017-03-25 (×2): via INTRAVENOUS

## 2017-03-25 MED ORDER — PROPOFOL 10 MG/ML IV BOLUS
INTRAVENOUS | Status: DC | PRN
  Administered 2017-03-25: 200 mg via INTRAVENOUS

## 2017-03-25 MED ORDER — ONDANSETRON HCL 4 MG/2ML IJ SOLN
INTRAMUSCULAR | Status: DC | PRN
  Administered 2017-03-25: 4 mg via INTRAVENOUS

## 2017-03-25 MED ORDER — PHENOL 1.4 % MT LIQD: 1.0000 | OROMUCOSAL | Status: DC | PRN

## 2017-03-25 MED ORDER — CEFAZOLIN SODIUM-DEXTROSE 2-4 GM/100ML-% IV SOLN
2.0000 g | Freq: Three times a day (TID) | INTRAVENOUS | Status: AC
  Administered 2017-03-25 – 2017-03-26 (×2): 2 g via INTRAVENOUS
  Filled 2017-03-25 (×2): qty 100

## 2017-03-25 MED ORDER — ZOLPIDEM TARTRATE 5 MG PO TABS
5.0000 mg | ORAL_TABLET | Freq: Once | ORAL | Status: AC
  Administered 2017-03-25: 5 mg via ORAL
  Filled 2017-03-25: qty 1

## 2017-03-25 MED ORDER — PHENYLEPHRINE HCL 10 MG/ML IJ SOLN
INTRAMUSCULAR | Status: DC | PRN
  Administered 2017-03-25: 40 ug via INTRAVENOUS

## 2017-03-25 MED ORDER — MIDAZOLAM HCL 5 MG/5ML IJ SOLN
INTRAMUSCULAR | Status: DC | PRN
  Administered 2017-03-25: 2 mg via INTRAVENOUS

## 2017-03-25 MED ORDER — SODIUM CHLORIDE 0.9 % IV SOLN: 250.0000 mL | INTRAVENOUS | Status: DC

## 2017-03-25 MED ORDER — CHLORHEXIDINE GLUCONATE CLOTH 2 % EX PADS
6.0000 | MEDICATED_PAD | Freq: Every day | CUTANEOUS | Status: AC
  Administered 2017-03-25 – 2017-03-29 (×3): 6 via TOPICAL

## 2017-03-25 MED ORDER — SODIUM CHLORIDE 0.9% FLUSH: 3.0000 mL | INTRAVENOUS | Status: DC | PRN

## 2017-03-25 MED ORDER — ROCURONIUM BROMIDE 100 MG/10ML IV SOLN
INTRAVENOUS | Status: DC | PRN
  Administered 2017-03-25 (×2): 10 mg via INTRAVENOUS
  Administered 2017-03-25: 50 mg via INTRAVENOUS
  Administered 2017-03-25: 10 mg via INTRAVENOUS

## 2017-03-25 MED ORDER — MUPIROCIN 2 % EX OINT
1.0000 "application " | TOPICAL_OINTMENT | Freq: Two times a day (BID) | CUTANEOUS | Status: AC
  Administered 2017-03-25 – 2017-03-29 (×10): 1 via NASAL
  Filled 2017-03-25: qty 22

## 2017-03-25 MED ORDER — MENTHOL 3 MG MT LOZG: 1.0000 | LOZENGE | OROMUCOSAL | Status: DC | PRN

## 2017-03-25 MED ORDER — INSULIN GLARGINE 100 UNIT/ML ~~LOC~~ SOLN
24.0000 [IU] | Freq: Every day | SUBCUTANEOUS | Status: DC
  Administered 2017-03-25: 24 [IU] via SUBCUTANEOUS
  Filled 2017-03-25 (×3): qty 0.24

## 2017-03-25 MED ORDER — LACTATED RINGERS IV SOLN: INTRAVENOUS | Status: DC

## 2017-03-25 MED ORDER — PHENYLEPHRINE HCL 10 MG/ML IJ SOLN
INTRAVENOUS | Status: DC | PRN
  Administered 2017-03-25: 25 ug/min via INTRAVENOUS

## 2017-03-25 MED ORDER — SUCCINYLCHOLINE CHLORIDE 20 MG/ML IJ SOLN
INTRAMUSCULAR | Status: DC | PRN
  Administered 2017-03-25: 100 mg via INTRAVENOUS

## 2017-03-25 MED ORDER — THROMBIN 5000 UNITS EX SOLR
CUTANEOUS | Status: AC
  Filled 2017-03-25: qty 5000

## 2017-03-25 MED ORDER — BUPIVACAINE HCL (PF) 0.5 % IJ SOLN
INTRAMUSCULAR | Status: AC
  Filled 2017-03-25: qty 30

## 2017-03-25 SURGICAL SUPPLY — 76 items
BAG DECANTER FOR FLEXI CONT (MISCELLANEOUS) ×3 IMPLANT
BASKET BONE COLLECTION (BASKET) ×3 IMPLANT
BENZOIN TINCTURE PRP APPL 2/3 (GAUZE/BANDAGES/DRESSINGS) IMPLANT
BLADE CLIPPER SURG (BLADE) IMPLANT
BLADE SURG 11 STRL SS (BLADE) ×3 IMPLANT
BLADE ULTRA TIP 2M (BLADE) IMPLANT
BNDG GAUZE ELAST 4 BULKY (GAUZE/BANDAGES/DRESSINGS) IMPLANT
BUR MATCHSTICK NEURO 3.0 LAGG (BURR) ×3 IMPLANT
BUR PRECISION FLUTE 5.0 (BURR) ×3 IMPLANT
CAGE PEEK 10X14X11 (Cage) ×2 IMPLANT
CAGE PEEK 10X14X11MM (Cage) ×2 IMPLANT
CANISTER SUCT 3000ML PPV (MISCELLANEOUS) ×3 IMPLANT
CARTRIDGE OIL MAESTRO DRILL (MISCELLANEOUS) ×1 IMPLANT
CLOSURE WOUND 1/2 X4 (GAUZE/BANDAGES/DRESSINGS) ×1
DECANTER SPIKE VIAL GLASS SM (MISCELLANEOUS) ×3 IMPLANT
DERMABOND ADVANCED (GAUZE/BANDAGES/DRESSINGS) ×2
DERMABOND ADVANCED .7 DNX12 (GAUZE/BANDAGES/DRESSINGS) ×1 IMPLANT
DIFFUSER DRILL AIR PNEUMATIC (MISCELLANEOUS) ×3 IMPLANT
DRAIN CHANNEL 10M FLAT 3/4 FLT (DRAIN) IMPLANT
DRAPE C-ARM 42X72 X-RAY (DRAPES) ×6 IMPLANT
DRAPE HALF SHEET 40X57 (DRAPES) IMPLANT
DRAPE LAPAROTOMY 100X72 PEDS (DRAPES) ×3 IMPLANT
DRAPE MICROSCOPE LEICA (MISCELLANEOUS) ×3 IMPLANT
DRAPE POUCH INSTRU U-SHP 10X18 (DRAPES) ×3 IMPLANT
DRSG OPSITE 4X5.5 SM (GAUZE/BANDAGES/DRESSINGS) ×9 IMPLANT
DRSG OPSITE POSTOP 3X4 (GAUZE/BANDAGES/DRESSINGS) ×3 IMPLANT
DURAPREP 6ML APPLICATOR 50/CS (WOUND CARE) ×3 IMPLANT
ELECT COATED BLADE 2.86 ST (ELECTRODE) ×3 IMPLANT
ELECT REM PT RETURN 9FT ADLT (ELECTROSURGICAL) ×3
ELECTRODE REM PT RTRN 9FT ADLT (ELECTROSURGICAL) ×1 IMPLANT
EVACUATOR SILICONE 100CC (DRAIN) IMPLANT
GAUZE SPONGE 4X4 16PLY XRAY LF (GAUZE/BANDAGES/DRESSINGS) IMPLANT
GLOVE BIO SURGEON STRL SZ7.5 (GLOVE) ×9 IMPLANT
GLOVE BIOGEL PI IND STRL 6.5 (GLOVE) ×1 IMPLANT
GLOVE BIOGEL PI IND STRL 7.5 (GLOVE) ×4 IMPLANT
GLOVE BIOGEL PI INDICATOR 6.5 (GLOVE) ×2
GLOVE BIOGEL PI INDICATOR 7.5 (GLOVE) ×8
GLOVE ECLIPSE 7.0 STRL STRAW (GLOVE) ×12 IMPLANT
GLOVE EXAM NITRILE LRG STRL (GLOVE) IMPLANT
GLOVE EXAM NITRILE XL STR (GLOVE) IMPLANT
GLOVE EXAM NITRILE XS STR PU (GLOVE) IMPLANT
GLOVE SURG SS PI 6.0 STRL IVOR (GLOVE) ×12 IMPLANT
GLOVE SURG SS PI 7.0 STRL IVOR (GLOVE) ×12 IMPLANT
GOWN STRL REUS W/ TWL LRG LVL3 (GOWN DISPOSABLE) ×3 IMPLANT
GOWN STRL REUS W/ TWL XL LVL3 (GOWN DISPOSABLE) ×1 IMPLANT
GOWN STRL REUS W/TWL 2XL LVL3 (GOWN DISPOSABLE) IMPLANT
GOWN STRL REUS W/TWL LRG LVL3 (GOWN DISPOSABLE) ×6
GOWN STRL REUS W/TWL XL LVL3 (GOWN DISPOSABLE) ×2
HEMOSTAT POWDER KIT SURGIFOAM (HEMOSTASIS) ×3 IMPLANT
KIT BASIN OR (CUSTOM PROCEDURE TRAY) ×3 IMPLANT
KIT ROOM TURNOVER OR (KITS) ×3 IMPLANT
NEEDLE HYPO 25X1 1.5 SAFETY (NEEDLE) ×3 IMPLANT
NEEDLE SPNL 22GX3.5 QUINCKE BK (NEEDLE) ×3 IMPLANT
NS IRRIG 1000ML POUR BTL (IV SOLUTION) ×3 IMPLANT
OIL CARTRIDGE MAESTRO DRILL (MISCELLANEOUS) ×3
PACK LAMINECTOMY NEURO (CUSTOM PROCEDURE TRAY) ×3 IMPLANT
PAD ARMBOARD 7.5X6 YLW CONV (MISCELLANEOUS) ×12 IMPLANT
PEEK ANATOMIC STRUT 5X14X11MM (Peek) ×3 IMPLANT
PLATE 2 42.5XLCK NS SPNE CVD (Plate) ×1 IMPLANT
PLATE 2 ATLANTIS TRANS (Plate) ×2 IMPLANT
RUBBERBAND STERILE (MISCELLANEOUS) ×6 IMPLANT
SCREW 4.0X15MM (Screw) ×12 IMPLANT
SPACER SPNL 11X14X10XPEEK (Cage) ×2 IMPLANT
SPCR SPNL 11X14X10XPEEK (Cage) ×2 IMPLANT
SPONGE INTESTINAL PEANUT (DISPOSABLE) ×3 IMPLANT
SPONGE SURGIFOAM ABS GEL 100 (HEMOSTASIS) IMPLANT
STRIP CLOSURE SKIN 1/2X4 (GAUZE/BANDAGES/DRESSINGS) ×2 IMPLANT
SUT ETHILON 3 0 FSL (SUTURE) IMPLANT
SUT VIC AB 3-0 SH 8-18 (SUTURE) ×3 IMPLANT
SUT VICRYL 3-0 RB1 18 ABS (SUTURE) ×3 IMPLANT
TAPE CLOTH 3X10 TAN LF (GAUZE/BANDAGES/DRESSINGS) ×3 IMPLANT
TIP KERRISON THIN FOOTPLATE 1M (MISCELLANEOUS) ×3 IMPLANT
TIP KERRISON THIN FOOTPLATE 2M (MISCELLANEOUS) ×3 IMPLANT
TOWEL GREEN STERILE (TOWEL DISPOSABLE) ×3 IMPLANT
TOWEL GREEN STERILE FF (TOWEL DISPOSABLE) ×3 IMPLANT
WATER STERILE IRR 1000ML POUR (IV SOLUTION) ×3 IMPLANT

## 2017-03-25 NOTE — Progress Notes (Signed)
RECEIVED PT FROM PACU STAFF IN BED, NO OBVIOUS DISTRESS. ASPEN COLLAR IN PLACE, HONEYCOMB DRESSING TO R ANTERIOR OF NECK. ALERT ORIENTED. DENIES NUMBNESS IN BIL UE AND LE. ALERT ORIENTED, DENIES PRESENCE OF PAIN. TAKING FLUIDS WELL. DENIES NEED TO VOID.

## 2017-03-25 NOTE — Anesthesia Preprocedure Evaluation (Addendum)
Anesthesia Evaluation  Patient identified by MRN, date of birth, ID band Patient awake    Reviewed: Allergy & Precautions, NPO status , Patient's Chart, lab work & pertinent test results  Airway Mallampati: II  TM Distance: >3 FB Neck ROM: Full    Dental  (+) Teeth Intact, Dental Advisory Given   Pulmonary neg pulmonary ROS,    breath sounds clear to auscultation       Cardiovascular hypertension,  Rhythm:Regular Rate:Normal     Neuro/Psych    GI/Hepatic negative GI ROS, Neg liver ROS,   Endo/Other  diabetes  Renal/GU negative Renal ROS     Musculoskeletal   Abdominal   Peds  Hematology   Anesthesia Other Findings   Reproductive/Obstetrics                            Anesthesia Physical Anesthesia Plan  ASA: III  Anesthesia Plan: General   Post-op Pain Management:    Induction: Intravenous  PONV Risk Score and Plan: 2 and Treatment may vary due to age or medical condition  Airway Management Planned: Oral ETT  Additional Equipment:   Intra-op Plan:   Post-operative Plan: Possible Post-op intubation/ventilation  Informed Consent: I have reviewed the patients History and Physical, chart, labs and discussed the procedure including the risks, benefits and alternatives for the proposed anesthesia with the patient or authorized representative who has indicated his/her understanding and acceptance.   Dental advisory given  Plan Discussed with: CRNA and Anesthesiologist  Anesthesia Plan Comments:         Anesthesia Quick Evaluation

## 2017-03-25 NOTE — Transfer of Care (Signed)
Immediate Anesthesia Transfer of Care Note  Patient: Dylan [name]  Procedure(s) Performed: ANTERIOR CERVICAL DECOMPRESSION/DISCECTOMY FUSION CERVICAL FIVE- CERVICAL SIX, CERVICAL SIX- CERVICAL SEVEN, CORPECTOMY (N/A Spine Cervical)  Patient Location: PACU  Anesthesia Type:General  Level of Consciousness: drowsy and patient cooperative  Airway & Oxygen Therapy: Patient Spontanous Breathing and Patient connected to nasal cannula oxygen  Post-op Assessment: Report given to RN  Post vital signs: Reviewed and stable  Last Vitals:  Vitals:   03/25/17 0425 03/25/17 0800  BP: (!) 147/75 (!) 148/79  Pulse: (!) 56 (!) 58  Resp: 20 20  Temp: 36.8 C 37.1 C  SpO2: 100% 100%    Last Pain:  Vitals:   03/25/17 0800  TempSrc: Oral  PainSc:          Complications: No apparent anesthesia complications

## 2017-03-25 NOTE — Op Note (Signed)
PREOP DIAGNOSIS:  1. Cervical Stenosis with myelopathy, C5-C7  POSTOP DIAGNOSIS: Same  PROCEDURE: 1. Corpectomy (>50%) at C6 for decompression of spinal cord and exiting nerve roots  2. Placement of intervertebral biomechanical device, Medtronic 25mm PEEK graft 3. Placement of anterior instrumentation consisting of interbody plate and screws spanning C5-C7 - Medtronic Atlantis 42.69mm Translational plate 4. Use of bone autograft 5. Arthrodesis C5-C7, anterior interbody technique  6. Use of intraoperative microscope  SURGEON: Dr. Lisbeth Renshaw, MD  ASSISTANT: Dr. Maeola Harman, MD  ANESTHESIA: General Endotracheal  EBL: 50cc  SPECIMENS: None  DRAINS: None  COMPLICATIONS: None immediate  CONDITION: Hemodynamically stable to PACU  HISTORY: Dylan [name] is a 52 y.o. admitted to the hospital through the emergency department with several months of progressive weakness.  His MRI of the spine demonstrated multiple intramedullary nonenhancing lesions consistent with demyelinating plaques.  He was also found to have severe cervical stenosis with likely myelomalacia.  He did have clinical exam findings consistent with myelopathy and therefore cervical decompression was indicated.  The risks and benefits of the surgery were explained in detail to the patient and his family.  After all questions were answered informed consent was obtained and witnessed.  PROCEDURE IN DETAIL: The patient was brought to the operating room and transferred to the operative table. After induction of general anesthesia, the patient was positioned on the operative table in the supine position with all pressure points meticulously padded. The skin of the neck was then prepped and draped in the usual sterile fashion.  After timeout was conducted, the skin was infiltrated with local anesthetic. Skin incision was then made sharply and Bovie electrocautery was used to dissect the subcutaneous tissue until the platysma  was identified. The platysma was then divided and undermined. The sternocleidomastoid muscle was then identified and, utilizing natural fascial planes in the neck, the prevertebral fascia was identified and the carotid sheath was retracted laterally and the trachea and esophagus retracted medially. Again using fluoroscopy, the correct disc spaces were identified. Bovie electrocautery was used to dissect in the subperiosteal plane and elevate the bilateral longus coli muscles. Table mounted retractors were then placed. At this point, the microscope was draped and brought into the field, and the remainder of the case was done under the microscope using microdissecting technique.  The C5-6 disc space was incised sharply and rongeurs were use to initially complete a discectomy. The high-speed drill was then used to complete discectomy until the posterior annulus was identified and removed and the posterior longitudinal ligament was identified. Using a nerve hook, the PLL was elevated, and Kerrison rongeurs were used to remove the posterior longitudinal ligament and the ventral thecal sac was identified. Using a combination of curettes and rongeurs, complete decompression of the thecal sac and exiting nerve roots at this level was completed, and verified using micro-nerve hook.  Attention was then turned to the C6-7 level. In a similar fashion, discectomy was completed initially with curettes and rongeurs, and completed with the drill. The PLL was again identified, elevated and incised. Using Kerrison rongeurs, I began removing the posterior longitudinal ligament and the posterior annulus.  It appeared that the PLL was significantly thickened extending up behind the C6 vertebral body.  I was unable to remove this through the C6-7 discectomy.  I therefore made the decision to proceed with C6 corpectomy in order to fully decompress the thecal sac.  The retractors were repositioned, and the bone trap was attached to  the suction.  High-speed drill was then used to complete corpectomy of C6.  Kerrison rongeurs were then used to remove the remainder of the posterior longitudinal ligament behind the body of C6.  At this point, the thecal sac was completely decompressed.  Curettes were used to prepare the C5 and C7 endplates by removing any cartilaginous endplate.  A 25 mm interbody cage was then sized and filled with bone autograft collected during corpectomy, and tapped into place. Position of the interbody devices was then confirmed with fluoroscopy.  After placement of the intervertebral devices, the anterior cervical plate was selected, and placed across the interspaces. Using a high-speed drill, the cortex of the cervical vertebral bodies was punctured, and screws inserted in the C5 and C7 levels. Final fluoroscopic images in lateral projection were taken to confirm good hardware placement.  At this point, after all counts were verified to be correct, meticulous hemostasis was secured using a combination of bipolar electrocautery and passive hemostatics. The platysma muscle was then closed using interrupted 3-0 Vicryl sutures, and the skin was closed with a interrupted subcuticular stitch. Sterile dressings were then applied and the drapes removed.  The patient tolerated the procedure well and was extubated in the room and taken to the postanesthesia care unit in stable condition.

## 2017-03-25 NOTE — Progress Notes (Signed)
PT Cancellation Note  Patient Details Name: Dylan [name] MRN: [id number] DOB: 06-15-1965   Cancelled Treatment:    Reason Eval/Treat Not Completed: Patient at procedure or test/unavailable pt off floor in OR. Will follow up post op.   Blake Divine A Vidya Bamford 03/25/2017, 11:50 AM Mylo Red, PT, DPT [phone]

## 2017-03-25 NOTE — Anesthesia Procedure Notes (Signed)
Procedure Name: Intubation Date/Time: 03/25/2017 1:16 PM Performed by: Lanell Matar, CRNA Pre-anesthesia Checklist: Patient identified, Emergency Drugs available, Suction available and Patient being monitored Patient Re-evaluated:Patient Re-evaluated prior to induction Oxygen Delivery Method: Circle System Utilized Preoxygenation: Pre-oxygenation with 100% oxygen Induction Type: IV induction Ventilation: Mask ventilation without difficulty Laryngoscope Size: Glidescope and 4 Grade View: Grade I Tube type: Oral Tube size: 7.5 mm Number of attempts: 1 Airway Equipment and Method: Stylet and Oral airway Placement Confirmation: ETT inserted through vocal cords under direct vision,  positive ETCO2 and breath sounds checked- equal and bilateral Secured at: 24 cm Tube secured with: Tape Dental Injury: Teeth and Oropharynx as per pre-operative assessment  Comments: Glidescope used to decrease neck manipulation during intubation. Neck remained neutral during induction and intubation. Grade 1 view with glidescope. ETT easily placed. +ETCO2, BBS=.

## 2017-03-25 NOTE — Progress Notes (Signed)
PROGRESS NOTE    Dylan [name]   IRS:[id number]  DOB: 1965/11/09  DOA: 03/21/2017 PCP: Patient, No Pcp Per   Brief Narrative:  Dylan [name] patient is a 52 year old male with past medical history of diabetes mellitus, hypertension and morbid obesity who started 7 months ago noted problems with right-sided weakness, his leg more than his arm. An MRI of the lumbar spine noted diffuse heterogeneous marrow signal abnormalities throughout the spine and pelvis and lower thoracic ribs concerning for multiple myeloma or metastatic disease as well as severe canal stenosis. Patient then underwent a cervical and thoracic MRI which noted diffusely abnormal bone marrow signals and scattered lesions throughout the cervical and thoracic cord as well as in the visualized pollens, medullary and cerebellum.   Neurology reviewed findings of MRI of spine with neurosurgery. It is now felt that the patient's lesions seen on MRI are consistent with MS, however his acute neurological weakness is more consistent with severe spinal stenosis at multiple levels.   Subjective: No new complaints.  ROS: no complaints of nausea, vomiting, constipation diarrhea, cough, dyspnea or dysuria. No other complaints.   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 Felecia Shelling   Uncontrolled diabetes mellitus type 2 without complications  -Continue Lantus-dosage increased to 15 units at bedtime as sugars are elevated due to steroids-has been placed on a resistant sliding scale as well -Metformin is on hold    Hypertension -Continue  lisinopril-also on as needed hydralazine    Morbid obesity ([place]) Body mass index is 35.13 kg/m.   DVT prophylaxis: [place] dictation Ds  code Status:   Full code Family Communication:  Disposition Plan: Continue to follow in the hospital Consultants:   Neurology  Neurosurgery Procedures:    Antimicrobials:  Anti-infectives (From admission, onward)   Start     Dose/Rate Route Frequency Ordered Stop   03/25/17 1404  bacitracin 50,000 Units in sodium chloride irrigation 0.9 % 500 mL irrigation       As needed 03/25/17 1404     03/25/17 1200  ceFAZolin (ANCEF) 3 g in dextrose 5 % 50 mL IVPB     3 g 130 mL/hr over 30 Minutes Intravenous 30 min pre-op 03/24/17 1535 03/25/17 1325       Objective: Vitals:   03/24/17 2000 03/25/17 0010 03/25/17 0425 03/25/17 0800  BP: (!) 149/76 (!) 142/69 (!) 147/75 (!) 148/79  Pulse: 86 75 (!) 56 (!) 58  Resp: 18 18 20 20   Temp: 98.6 F (37 C) 98.3 F (36.8 C) 98.3 F (36.8 C) 98.7 F (37.1 C)  TempSrc: Oral Oral Oral Oral  SpO2: 97% 97% 100% 100%  Weight:      Height:        Intake/Output Summary (Last 24 hours) at 03/25/2017 1517 Last data filed at 03/25/2017 1430 Gross per 24 hour  Intake 1120 ml  Output 2000 ml  Net -880 ml   Filed Weights   03/21/17 1713 03/22/17 0839  Weight: (!) 158.8 kg (350 lb) 130.9 kg (288 lb 9.3 oz)    Examination: General exam: Appears comfortable  HEENT: PERRLA, oral mucosa moist,  no sclera icterus or thrush Respiratory system: Clear to auscultation. Respiratory effort normal. Cardiovascular system: S1 & S2 heard, RRR.  No murmurs  Gastrointestinal system: Abdomen soft, non-tender, nondistended. Normal bowel sound. No organomegaly Central nervous system: Alert and oriented.  Has 2-3/5 weakness in lower extremities and 3/5 weakness in hands Extremities: No cyanosis, clubbing or edema Skin: No rashes or ulcers Psychiatry:  Mood & affect appropriate.     Data Reviewed: I have personally reviewed  following labs and imaging studies  CBC: Recent Labs  Lab 03/21/17 0853 03/22/17 0152  WBC 8.2 7.5  NEUTROABS 6.0 4.5  HGB 13.9 13.4  HCT 39.5 38.6*  MCV 85.5 87.1  PLT 258 381   Basic Metabolic Panel: Recent Labs  Lab 03/21/17 0853 03/22/17 0152 03/23/17 0427 03/25/17 0414  NA 140 140 138 137  K 3.9 3.6 4.2 4.0  CL 106 106 105 105  CO2 25 22 21* 22  GLUCOSE 179* 252* 283* 277*  BUN 9 8 11 16   CREATININE 0.84 1.22 0.90 0.97  CALCIUM 9.4 8.8* 9.2 8.8*   GFR: Estimated Creatinine Clearance: 136 mL/min (by C-G formula based on SCr of 0.97 mg/dL). Liver Function Tests: Recent Labs  Lab 03/21/17 0853 03/22/17 1618  AST 36  --   ALT 29  --   ALKPHOS 64  --   BILITOT 1.2  --   PROT 7.5  --   ALBUMIN 4.1 4.1   No results for input(s): LIPASE, AMYLASE in the last 168 hours. No results for input(s): AMMONIA in the last 168 hours. Coagulation Profile: No results for input(s): INR, PROTIME in the last 168 hours. Cardiac Enzymes: No results for input(s): CKTOTAL, CKMB, CKMBINDEX, TROPONINI in the last 168 hours. BNP (last 3 results) No results for input(s): PROBNP in the last 8760 hours. HbA1C: No results for input(s): HGBA1C in the last 72 hours. CBG: Recent Labs  Lab 03/24/17 1117 03/24/17 1737 03/24/17 2120 03/25/17 0607 03/25/17 1051  GLUCAP 259* 368* 316* 281* 162*   Lipid Profile: No results for input(s): CHOL, HDL, LDLCALC, TRIG, CHOLHDL, LDLDIRECT in the last 72 hours. Thyroid Function Tests: No results for input(s): TSH, T4TOTAL, FREET4, T3FREE, THYROIDAB in the last 72 hours. Anemia Panel: No results for input(s): VITAMINB12, FOLATE, FERRITIN, TIBC, IRON, RETICCTPCT in the last 72 hours. Urine analysis: No results found for: COLORURINE, APPEARANCEUR, LABSPEC, PHURINE, GLUCOSEU, HGBUR, BILIRUBINUR, KETONESUR, PROTEINUR, UROBILINOGEN, NITRITE, LEUKOCYTESUR Sepsis Labs: @LABRCNTIP (procalcitonin:4,lacticidven:4) ) Recent Results (from the past 240  hour(s))  Anaerobic culture     Status: None (Preliminary result)   Collection Time: 03/22/17  4:30 PM  Result Value Ref Range Status   Specimen Description CSF  Final   Special Requests NONE  Final   Culture   Final    NO GROWTH < 24 HOURS Performed at [facility], [address], [place], [place] [address]    Report Status PENDING  Incomplete  CSF culture     Status: None (Preliminary result)   Collection Time: 03/22/17  4:30 PM  Result Value Ref Range Status   Specimen Description CSF  Final   Special Requests NONE  Final   Gram Stain   Final    WBC PRESENT, PREDOMINANTLY MONONUCLEAR NO ORGANISMS SEEN CYTOSPIN SMEAR    Culture   Final    NO GROWTH 3 DAYS Performed at [facility], [place] [address], [place], [place] [address]    Report Status PENDING  Incomplete  Surgical PCR screen  Status: Abnormal   Collection Time: 03/24/17 10:26 PM  Result Value Ref Range Status   MRSA, PCR NEGATIVE NEGATIVE Final   Staphylococcus aureus POSITIVE (A) NEGATIVE Final    Comment: (NOTE) The Xpert SA Assay (FDA approved for NASAL specimens in patients 65 years of age and older), is one component of a comprehensive surveillance program. It is not intended to diagnose infection nor to guide or monitor treatment. Performed at [facility], [place] [address] [place], [place] [address]          Radiology Studies: Mr Jeri Cos QQ Contrast  Result Date: 03/23/2017 CLINICAL DATA:  Progressive RIGHT-sided weakness for 7 months. History of hypertension, diabetes. Follow-up cervical spinal cord lesions. EXAM: MRI HEAD WITHOUT AND WITH CONTRAST TECHNIQUE: Multiplanar, multiecho pulse sequences of the brain and surrounding structures were obtained without and with intravenous contrast. CONTRAST:  28m MULTIHANCE GADOBENATE DIMEGLUMINE 529 MG/ML IV SOLN COMPARISON:  None. FINDINGS: INTRACRANIAL CONTENTS: Faint reduced diffusion bilateral occipital lobes, extending toward splenium  of corpus callosum with normalized or increased diffusivity. At least 5 subcentimeter posterior fossa lesions. 4 mm T2 bright lesion ventral spinal cord. Far greater than 10 supratentorial white matter lesions, some of which correspond to diffusion abnormality, many of which radiate from the peri ventricular margin with low T1 signal seen with black holes of demyelination. Dominant lesion LEFT frontal periventricular white matter measuring 18 mm. Additional juxta cortical and deep white matter lesions. Two 9 mm lesions LEFT thalamus extending to LEFT hypothalamus. 4 mm lesion RIGHT basal ganglia mild parenchymal brain volume loss. No hydrocephalus. Faint marginal enhancement bilateral occipital lobe lesion (coronal 8/28 and 4/28 post contrast T1). No susceptibility artifact to suggest hemorrhage. No midline shift, mass effect. No abnormal extra-axial fluid collections or extra-axial enhancement. VASCULAR: Normal major intracranial vascular flow voids present at skull base. SKULL AND UPPER CERVICAL SPINE: No abnormal sellar expansion. No suspicious calvarial bone marrow signal. Craniocervical junction maintained. SINUSES/ORBITS: The mastoid air-cells and included paranasal sinuses are well-aerated.T2 bright signal LEFT optic nerve intracanalicular segment suspected, no definite enhancement though limited assessment. OTHER: None. IMPRESSION: 1. At least 5 infratentorial and greater than 10 supratentorial lesions consistent with chronic demyelination, with acute to subacute enhancing lesions bilateral occipital lobes. Subcentimeter LEFT thalamus and RIGHT basal ganglia demyelinating plaques. 2. Probable LEFT optic neuritis. 3. Mild parenchymal brain volume loss for age. Electronically Signed   By: CElon AlasM.D.   On: 03/23/2017 22:20      Scheduled Meds: . [MAR Hold] B-complex with vitamin C  1 tablet Oral Daily  . [MAR Hold] Chlorhexidine Gluconate Cloth  6 each Topical Daily  . [MAR Hold] insulin  aspart  0-20 Units Subcutaneous TID WC  . [MAR Hold] insulin aspart  0-5 Units Subcutaneous QHS  . [MAR Hold] insulin glargine  24 Units Subcutaneous QHS  . [MAR Hold] lisinopril  10 mg Oral Daily  . [MAR Hold] LORazepam  1 mg Intravenous Once  . [MAR Hold] mupirocin ointment  1 application Nasal BID  . [MAR Hold] pantoprazole  40 mg Oral Daily  . [MAR Hold] sodium chloride flush  3 mL Intravenous Q12H  . [MAR Hold] sodium chloride flush  3 mL Intravenous Q12H   Continuous Infusions: . [MAR Hold] sodium chloride    . lactated ringers    . [MAR Hold] methylPREDNISolone (SOLU-MEDROL) injection Stopped (03/24/17 1123)     LOS: 4 days    Time spent in minutes: 3[place]  MD [facility] Pager: [email or web address] Password [facility] 03/25/2017, 3:17 PM

## 2017-03-25 NOTE — Anesthesia Postprocedure Evaluation (Signed)
Anesthesia Post Note  Patient: Dylan [name]  Procedure(s) Performed: ANTERIOR CERVICAL DECOMPRESSION/DISCECTOMY FUSION CERVICAL FIVE- CERVICAL SIX, CERVICAL SIX- CERVICAL SEVEN, CORPECTOMY (N/A Spine Cervical)     Patient location during evaluation: PACU Anesthesia Type: General Level of consciousness: awake Pain management: pain level controlled Vital Signs Assessment: post-procedure vital signs reviewed and stable Respiratory status: spontaneous breathing Cardiovascular status: stable Anesthetic complications: no    Last Vitals:  Vitals:   03/25/17 1645 03/25/17 1655  BP:  (!) 151/95  Pulse: 83 64  Resp: (!) 21 15  Temp:    SpO2: 97% 98%    Last Pain:  Vitals:   03/25/17 1655  TempSrc:   PainSc: 0-No pain      LLE Sensation: Full sensation;No numbness;No pain;No tingling (03/25/17 1655)   RLE Sensation: Full sensation;No numbness;No tingling;No pain (03/25/17 1655)      Early Ord

## 2017-03-26 ENCOUNTER — Encounter (HOSPITAL_COMMUNITY): Payer: Self-pay | Admitting: Neurosurgery

## 2017-03-26 LAB — GLUCOSE, CAPILLARY
GLUCOSE-CAPILLARY: 190 mg/dL — AB (ref 65–99)
Glucose-Capillary: 382 mg/dL — ABNORMAL HIGH (ref 65–99)
Glucose-Capillary: 307 mg/dL — ABNORMAL HIGH (ref 65–99)
Glucose-Capillary: 454 mg/dL — ABNORMAL HIGH (ref 65–99)

## 2017-03-26 LAB — CSF CULTURE W GRAM STAIN

## 2017-03-26 LAB — CSF CULTURE: CULTURE: NO GROWTH

## 2017-03-26 MED ORDER — INSULIN ASPART 100 UNIT/ML ~~LOC~~ SOLN
20.0000 [IU] | Freq: Once | SUBCUTANEOUS | Status: AC
  Administered 2017-03-26: 20 [IU] via SUBCUTANEOUS

## 2017-03-26 MED ORDER — INSULIN GLARGINE 100 UNIT/ML ~~LOC~~ SOLN
35.0000 [IU] | Freq: Every day | SUBCUTANEOUS | Status: DC
  Administered 2017-03-26 – 2017-03-28 (×3): 35 [IU] via SUBCUTANEOUS
  Filled 2017-03-26 (×4): qty 0.35

## 2017-03-26 MED FILL — Thrombin For Soln 5000 Unit: CUTANEOUS | Qty: 5000 | Status: AC

## 2017-03-26 NOTE — Plan of Care (Addendum)
Pain is controlled with current medications.

## 2017-03-26 NOTE — Plan of Care (Signed)
Pt has voided.

## 2017-03-26 NOTE — Progress Notes (Addendum)
Discussed with patient as well as PT and OT after working with him today. I will follow up on Monday to assess his readiness to admit to inpt rehab. Therapy encouraging pt to ask to get up to chair tomorrow using the maxi move. [phone]

## 2017-03-26 NOTE — Progress Notes (Addendum)
Physical Therapy Treatment Patient Details Name: Dylan [name] MRN: [id number] DOB: [birth year] Today's Date: 03/26/2017    History of Present Illness Patient is a 52 y/o male who presents with 7 month hx of progressive weakness, Right>left, s/p 2 falls at home. 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 Cord compression at C5-6 spinal level. MRI are consistent with MS. s/p C5-6, 6-7 ACDF 2/21. PMH includes HTN, DM.    PT Comments    Patient s/p ACDF and presents with marked weakness in BLEs and UEs, impaired proprioception, decreased sensation in bil feet- all worsened from before surgery. Pt also with poor trunk control sitting EOB requiring BUE support vs external support for static sitting balance to maintain upright. Standing not attempted today due to safety concerns and weakness. Able to sit EOB ~25 mins working on trunk control, dynamic sitting balance and tolerance to upright post surgery. Education re: cervical collar, log roll technique and precautions. Encouraged OOB to chair tomorrow with nursing using maxi move. Will continue to follow and progress as tolerated.    Follow Up Recommendations  CIR;Supervision for mobility/OOB     Equipment Recommendations  None recommended by PT    Recommendations for Other Services       Precautions / Restrictions Precautions Precautions: Fall Required Braces or Orthoses: Cervical Brace Cervical Brace: Hard collar Restrictions Weight Bearing Restrictions: No Other Position/Activity Restrictions: Collar can be off when in bed, but on with OOB.    Mobility  Bed Mobility Overal bed mobility: Needs Assistance Bed Mobility: Rolling;Sidelying to Sit;Sit to Sidelying Rolling: Mod assist;+2 for physical assistance Sidelying to sit: Mod assist;+2 for  physical assistance;HOB elevated     Sit to sidelying: Max assist;+2 for physical assistance General bed mobility comments: Cues for log roll technique and to reach for rail; assist with trunk and scooting bottom to EOB. Assist to bring LEs into bed and lower trunk to get to return to supine.  Transfers                 General transfer comment: Deferred secondary to weakness and safety concerns.   Ambulation/Gait                 Stairs            Wheelchair Mobility    Modified Rankin (Stroke Patients Only)       Balance Overall balance assessment: Needs assistance;History of Falls Sitting-balance support: Feet supported;Bilateral upper extremity supported Sitting balance-Leahy Scale: Poor Sitting balance - Comments: Requires BUE support to maintain static sitting balance; LOB posteriorly and to the right with AROM BUEs/LEs requring Max A to maintain. Able to initiate spinal extensors but not sustain for midline/upright. Sat EOB ~25 mins. Postural control: Posterior lean;Right lateral lean                                  Cognition Arousal/Alertness: Awake/alert Behavior During Therapy: WFL for tasks assessed/performed Overall Cognitive Status: Within Functional Limits for tasks assessed                                 General Comments: Pt seems overwhelmed with new diagnosis and current functional status.  Exercises Other Exercises Other Exercises: Scapular retraction x6 sittng EOB with decreased activation right side.     General Comments General comments (skin integrity, edema, etc.): RLE MMT- 0/5 DF/PF, 2-/5 knee extension, 0/5 knee flexion, 2/5 hip flexion; LLE MMT- 2+/5 knee extension/flexion, 2+/5 DF/PF, 2+/5 hip flexion. Sensation decreased in bil feet. Impaired proprioception BLEs.       Pertinent Vitals/Pain Pain Assessment: No/denies pain    Home Living                      Prior Function             PT Goals (current goals can now be found in the care plan section) Progress towards PT goals: Not progressing toward goals - comment    Frequency    Min 3X/week      PT Plan Current plan remains appropriate    Co-evaluation PT/OT/SLP Co-Evaluation/Treatment: Yes Reason for Co-Treatment: For patient/therapist safety;To address functional/ADL transfers PT goals addressed during session: Mobility/safety with mobility;Strengthening/ROM;Balance        AM-PAC PT "6 Clicks" Daily Activity  Outcome Measure  Difficulty turning over in bed (including adjusting bedclothes, sheets and blankets)?: Unable Difficulty moving from lying on back to sitting on the side of the bed? : Unable Difficulty sitting down on and standing up from a chair with arms (e.g., wheelchair, bedside commode, etc,.)?: Unable Help needed moving to and from a bed to chair (including a wheelchair)?: Total Help needed walking in hospital room?: Total Help needed climbing 3-5 steps with a railing? : Total 6 Click Score: 6    End of Session Equipment Utilized During Treatment: Cervical collar Activity Tolerance: Patient tolerated treatment well Patient left: in bed;with call bell/phone within reach;with bed alarm set;with SCD's reapplied Nurse Communication: Mobility status;Need for lift equipment PT Visit Diagnosis: Muscle weakness (generalized) (M62.81);Difficulty in walking, not elsewhere classified (R26.2);Unsteadiness on feet (R26.81);History of falling (Z91.81);Ataxic gait (R26.0)     Time: 1610-9604 PT Time Calculation (min) (ACUTE ONLY): 37 min  Charges:     1 Re-eval charge                  G Codes:       Mylo Red, PT, DPT [phone]     Blake Divine A Lanier Ensign 03/26/2017, 11:33 AM

## 2017-03-26 NOTE — Progress Notes (Signed)
   03/26/17 1400  Clinical Encounter Type  Visited With Patient  Visit Type Follow-up  Referral From Nurse  Consult/Referral To Chaplain  Spiritual Encounters  Spiritual Needs Prayer;Emotional  Stress Factors  Patient Stress Factors Major life changes  Chaplain was asked to visit the PT after rounds because of a new onset diagnosis.  The PT was in high spirits in understanding what God has been doing in his life.  He understands that God gives at the appointed time and at the appointed time only.

## 2017-03-26 NOTE — Progress Notes (Signed)
PROGRESS NOTE    Dylan [name]   ION:[id number]  DOB: 09/01/1965  DOA: 03/21/2017 PCP: Patient, No Pcp Per   Brief Narrative:  Dylan [name] patient is a 52 year old male with past medical history of diabetes mellitus, hypertension and morbid obesity who started 7 months ago noted problems with right-sided weakness, his leg more than his arm. An MRI of the lumbar spine noted diffuse heterogeneous marrow signal abnormalities throughout the spine and pelvis and lower thoracic ribs concerning for multiple myeloma or metastatic disease as well as severe canal stenosis. Patient then underwent a cervical and thoracic MRI which noted diffusely abnormal bone marrow signals and scattered lesions throughout the cervical and thoracic cord as well as in the visualized pollens, medullary and cerebellum.   Neurology reviewed findings of MRI of spine with neurosurgery. It is now felt that the patient's lesions seen on MRI are consistent with MS, however his acute neurological weakness is more consistent with severe spinal stenosis at multiple levels.   Subjective: No new complaints.  ROS: no complaints of nausea, vomiting, constipation diarrhea, cough, dyspnea or dysuria. No other complaints.   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 Felecia Shelling   Uncontrolled diabetes mellitus type 2 without complications  -Continue Lantus-dosage increased to 35 units at bedtime today as  sugars are still elevated due to steroids-has been placed on a resistant sliding scale as well -Metformin is on hold   Hypertension -Continue lisinopril-also on PRN hydralazine   Morbid obesity   Body mass index is 35.13 kg/m.   DVT prophylaxis:  dictation Ds  code Status:   Full code Family Communication:  Disposition Plan: Continue to follow in the hospital Consultants:   Neurology  Neurosurgery Procedures:  Cervical decompression  Antimicrobials:  Anti-infectives (From admission, onward)   Start     Dose/Rate Route Frequency Ordered Stop   03/25/17 1900  ceFAZolin (ANCEF) IVPB 2g/100 mL premix     2 g 200 mL/hr over 30 Minutes Intravenous Every 8 hours 03/25/17 1830 03/26/17 0434   03/25/17 1404  bacitracin 50,000 Units in sodium chloride irrigation 0.9 % 500 mL irrigation  Status:  Discontinued       As needed 03/25/17 1404 03/25/17 1551   03/25/17 1200  ceFAZolin (ANCEF) 3 g in dextrose 5 % 50 mL IVPB     3 g 130 mL/hr over 30 Minutes Intravenous 30 min pre-op 03/24/17 1535 03/25/17 1325       Objective: Vitals:   03/25/17 2043 03/26/17 0008 03/26/17 0430 03/26/17 0757  BP: (!) 176/90 (!) 183/95 (!) 149/82 (!) 155/76  Pulse: 92 97 75 60  Resp: _0 Temp: 99.2 F (37.3 C) 98.7 F (37.1 C) 98.4 F (36.9 C) 98.4 F (36.9 C)  TempSrc: Oral Oral Oral Oral  SpO2: 98% 99% 97% 93%  Weight:      Height:        Intake/Output Summary (Last 24 hours) at 03/26/2017 1049 Last data filed at 03/26/2017 0953 Gross per  24 hour  Intake 1200 ml  Output 2650 ml  Net -1450 ml   Filed Weights   03/21/17 1713 03/22/17 0839  Weight: (!) 158.8 kg (350 lb) 130.9 kg (288 lb 9.3 oz)    Examination: General exam: Appears comfortable  HEENT: PERRLA, oral mucosa moist, no sclera icterus or thrush Respiratory system: Clear to auscultation. Respiratory effort normal. Cardiovascular system: S1 & S2 heard, RRR.  No murmurs  Gastrointestinal system: Abdomen soft,  non-tender, nondistended. Normal bowel sound. No organomegaly Central nervous system: Alert and oriented.  Has 2-3/5 weakness in lower extremities and 3/5 weakness in hands Extremities: No cyanosis, clubbing or edema Skin: No rashes or ulcers Psychiatry:  Mood & affect appropriate.     Data Reviewed: I have personally reviewed following labs and imaging studies  CBC: Recent Labs  Lab 03/21/17 0853 03/22/17 0152  WBC 8.2 7.5  NEUTROABS 6.0 4.5  HGB 13.9 13.4  HCT 39.5 38.6*  MCV 85.5 87.1  PLT 258 378   Basic Metabolic Panel: Recent Labs  Lab 03/21/17 0853 03/22/17 0152 03/23/17 0427 03/25/17 0414  NA 140 140 138 137  K 3.9 3.6 4.2 4.0  CL 106 106 105 105  CO2 25 22 21* 22  GLUCOSE 179* 252* 283* 277*  BUN _0 CREATININE 0.84 1.22 0.90 0.97  CALCIUM 9.4 8.8* 9.2 8.8*   GFR: Estimated Creatinine Clearance: 136 mL/min (by C-G formula based on SCr of 0.97 mg/dL). Liver Function Tests: Recent Labs  Lab 03/21/17 0853 03/22/17 1618  AST 36  --   ALT 29  --   ALKPHOS 64  --   BILITOT 1.2  --   PROT 7.5  --   ALBUMIN 4.1 4.1   No results for input(s): LIPASE, AMYLASE in the last 168 hours. No results for input(s): AMMONIA in the last 168 hours. Coagulation Profile: No results for input(s): INR, PROTIME in the last 168 hours. Cardiac Enzymes: No results for input(s): CKTOTAL, CKMB, CKMBINDEX, TROPONINI in the last 168 hours. BNP (last 3 results) No results for input(s): PROBNP in the last 8760 hours. HbA1C: No results for input(s): HGBA1C in the last 72 hours. CBG: Recent Labs  Lab 03/25/17 0607 03/25/17 1051 03/25/17 1556 03/25/17 2149 03/26/17 0604  GLUCAP 281* 162* 193* 392* 382*   Lipid Profile: No results for input(s): CHOL, HDL, LDLCALC, TRIG, CHOLHDL, LDLDIRECT in the last 72 hours. Thyroid Function Tests: No results for input(s): TSH, T4TOTAL, FREET4, T3FREE, THYROIDAB in the last 72 hours. Anemia Panel: No results for input(s):  VITAMINB12, FOLATE, FERRITIN, TIBC, IRON, RETICCTPCT in the last 72 hours. Urine analysis: No results found for: COLORURINE, APPEARANCEUR, LABSPEC, [place], GLUCOSEU, HGBUR, BILIRUBINUR, KETONESUR, PROTEINUR, UROBILINOGEN, NITRITE, LEUKOCYTESUR Sepsis Labs: _1 (procalcitonin:4,lacticidven:4) ) Recent Results (from the past 240 hour(s))  Anaerobic culture     Status: None (Preliminary result)   Collection Time: 03/22/17  4:30 PM  Result Value Ref Range Status   Specimen Description CSF  Final   Special Requests NONE  Final   Culture   Final    NO GROWTH < 24 HOURS Performed at [facility], [address], [place], [place] [address]    Report Status PENDING  Incomplete  CSF culture     Status: None   Collection Time: 03/22/17  4:30 PM  Result Value Ref Range Status   Specimen Description CSF  Final   Special Requests NONE  Final   Gram Stain   Final  WBC PRESENT, PREDOMINANTLY MONONUCLEAR NO ORGANISMS SEEN CYTOSPIN SMEAR    Culture   Final    NO GROWTH 3 DAYS Performed at [facility], [place] [address], [place], [place] [address]    Report Status 03/26/2017 FINAL  Final  Fungus Culture With Stain     Status: None (Preliminary result)   Collection Time: 03/22/17  4:30 PM  Result Value Ref Range Status   Fungus Stain Final report  Final    Comment: (NOTE) Performed At: [facility] [place], [place] [id number] Rush Farmer MD [id number]    Fungus (Mycology) Culture PENDING  Incomplete   Fungal Source CSF  Final  Fungus Culture Result     Status: None   Collection Time: 03/22/17  4:30 PM  Result Value Ref Range Status   Result 1 Comment  Final    Comment: (NOTE) KOH/Calcofluor preparation:  no fungus observed. Performed At: [facility] [place], [place] [id number] Rush Farmer MD IO:[id number]   Surgical PCR screen     Status: Abnormal   Collection Time: 03/24/17 10:26 PM  Result Value Ref Range  Status   MRSA, PCR NEGATIVE NEGATIVE Final   Staphylococcus aureus POSITIVE (A) NEGATIVE Final    Comment: (NOTE) The Xpert SA Assay (FDA approved for NASAL specimens in patients 42 years of age and older), is one component of a comprehensive surveillance program. It is not intended to diagnose infection nor to guide or monitor treatment. Performed at [facility], [place] [address] [place], [place] [address]          Radiology Studies: Dg Cervical Spine 1 View  Result Date: 03/25/2017 CLINICAL DATA:  Cervical spine fusion. EXAM: DG C-ARM 61-120 MIN; DG CERVICAL SPINE - 1 VIEW COMPARISON:  03/23/2017. FINDINGS: Lower cervical anterior and interbody fusion. Hardware intact. Anatomic alignment. Surgical sponges noted over the lower anterior neck. IMPRESSION: Lower cervical anterior interbody fusion with anatomic alignment. Electronically Signed   By: Marcello Moores  Register   On: 03/25/2017 15:47   Dg C-arm 1-60 Min  Result Date: 03/25/2017 CLINICAL DATA:  Cervical spine fusion. EXAM: DG C-ARM 61-120 MIN; DG CERVICAL SPINE - 1 VIEW COMPARISON:  03/23/2017. FINDINGS: Lower cervical anterior and interbody fusion. Hardware intact. Anatomic alignment. Surgical sponges noted over the lower anterior neck. IMPRESSION: Lower cervical anterior interbody fusion with anatomic alignment. Electronically Signed   By: Marcello Moores  Register   On: 03/25/2017 15:47      Scheduled Meds: . B-complex with vitamin C  1 tablet Oral Daily  . Chlorhexidine Gluconate Cloth  6 each Topical Daily  . insulin aspart  0-20 Units Subcutaneous TID WC  . insulin aspart  0-5 Units Subcutaneous QHS  . insulin glargine  24 Units Subcutaneous QHS  . lisinopril  10 mg Oral Daily  . LORazepam  1 mg Intravenous Once  . mupirocin ointment  1 application Nasal BID  . pantoprazole  40 mg Oral Daily  . sodium chloride flush  3 mL Intravenous Q12H  . sodium chloride flush  3 mL Intravenous Q12H  . sodium chloride flush  3 mL  Intravenous Q12H   Continuous Infusions: . sodium chloride    . sodium chloride    . lactated ringers    . methocarbamol (ROBAXIN)  IV    . methylPREDNISolone (SOLU-MEDROL) injection 1,000 mg (03/26/17 0802)     LOS: 5 days    Time spent in minutes: 35    Debbe Odea, MD [facility] Pager: [email or web address] Password [facility] 03/26/2017, 10:49 AM

## 2017-03-26 NOTE — Progress Notes (Signed)
Results for TYRIN, HERBERS (MRN [id number]) as of 03/26/2017 13:27  Ref. Range 03/25/2017 10:51 03/25/2017 15:56 03/25/2017 21:49 03/26/2017 06:04 03/26/2017 11:40  Glucose-Capillary Latest Ref Range: 65 - 99 mg/dL 782 (H) 956 (H) 213 (H) 382 (H) 307 (H)  Noted that blood sugars continuing to be elevated due to steroids.  Recommend increasing Lantus to 30 units daily and continuing other insulin orders as written. Will need to adjust dosages when steroids are reduced. Will continue to monitor blood sugars while in the hospital.  Smith Mince RN BSN CDE Diabetes Coordinator Pager: [phone]  8am-5pm

## 2017-03-26 NOTE — Progress Notes (Signed)
No issues overnight. Pt denies any real change in numbness or weakness of upper and lower extremities. No difficulty swallowing, tolerating diet.  EXAM:  BP (!) 155/76 (BP Location: Left Arm)   Pulse 60   Temp 98.4 F (36.9 C) (Oral)   Resp 16   Ht 6\' 4"  (1.93 m)   Wt 130.9 kg (288 lb 9.3 oz)   SpO2 93%   BMI 35.13 kg/m   Awake, alert, oriented  Speech fluent, appropriate  CN grossly intact  Stable BUE primarily grip weakness, hand contracture 3/5 bilateral hip flexion 3/5 left knee extension, minimal right knee extensor strength Minimal distal movement BLE  IMPRESSION:  52 y.o. male s/p C6 corpectomy for decompression with concomitant new dx of MS. At baseline  PLAN: - Cont PT/OT - Will likely need CIR

## 2017-03-26 NOTE — Progress Notes (Signed)
Pt is insisting of having his neck collar removed as soon as possible. He states that " I will sign any paper if necessary but this collar has to come off". Dr. Conchita Paris was paged and he authorized to remove the collar when pt is in bed but must be on while out of bed and during PT.  Colleen Can, RN

## 2017-03-26 NOTE — Evaluation (Signed)
Occupational Therapy Evaluation Patient Details Name: Dylan [name] MRN: [id number] DOB: 03/28/1965 Today's Date: 03/26/2017    History of Present Illness Patient is a 52 y/o male who presents with 7 month hx of progressive weakness, Right>left, s/p 2 falls at home. 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 Cord compression at C5-6 spinal level. MRI are consistent with MS. s/p C5-6, 6-7 ACDF 2/21. PMH includes HTN, DM.   Clinical Impression   Pt reevaluated following ACDF. Pt with weakness greater proximal vs distal. Demonstrating poor sitting balance at EOB. Pt unsafe to transfer without lift equipment. He is able to feed himself and perform grooming in supported sitting with set up, otherwise requires mod to total assist for ADL. Pt will need extensive rehab upon discharge. Will follow.    Follow Up Recommendations  CIR;Supervision/Assistance - 24 hour    Equipment Recommendations       Recommendations for Other Services       Precautions / Restrictions Precautions Precautions: Fall Required Braces or Orthoses: Cervical Brace Cervical Brace: Hard collar;Other (comment)(when OOB) Restrictions Weight Bearing Restrictions: No Other Position/Activity Restrictions: Collar can be off when in bed, but on with OOB.      Mobility Bed Mobility Overal bed mobility: Needs Assistance Bed Mobility: Rolling;Sidelying to Sit;Sit to Sidelying Rolling: Mod assist;+2 for physical assistance Sidelying to sit: Mod assist;+2 for physical assistance;HOB elevated     Sit to sidelying: Max assist;+2 for physical assistance General bed mobility comments: Cues for log roll technique and to reach for rail; assist with trunk and scooting bottom to EOB. Assist to bring LEs into bed and lower trunk to get to  return to supine.  Transfers                 General transfer comment: Deferred secondary to weakness and safety concerns.     Balance Overall balance assessment: Needs assistance;History of Falls Sitting-balance support: Feet supported;Bilateral upper extremity supported Sitting balance-Leahy Scale: Poor Sitting balance - Comments: Requires BUE support to maintain static sitting balance; LOB posteriorly and to the right with AROM BUEs/LEs requring Max A to maintain. Able to initiate spinal extensors but not sustain for midline/upright. Sat EOB ~25 mins. Postural control: Posterior lean;Right lateral lean                                 ADL either performed or assessed with clinical judgement   ADL Overall ADL's : Needs assistance/impaired Eating/Feeding: Sitting;Set up   Grooming: Oral care;Sitting;Minimal assistance Grooming Details (indicate cue type and reason): assist for sitting balance Upper Body Bathing: Sitting;Moderate assistance   Lower Body Bathing: Total assistance;Bed level   Upper Body Dressing : Moderate assistance;Sitting   Lower Body Dressing: Total assistance;Bed level     Toilet Transfer Details (indicate cue type and reason): total assist at bed level Toileting- Clothing Manipulation and Hygiene: Total assistance;Bed level         General ADL Comments: Pt significantly weaker than prior to ACDF.     Vision Baseline Vision/History: Wears glasses Wears Glasses: At all times Patient Visual Report: No change from baseline       Perception     Praxis      Pertinent Vitals/Pain Pain Assessment: No/denies pain  Hand Dominance Right   Extremity/Trunk Assessment Upper Extremity Assessment Upper Extremity Assessment: RUE deficits/detail;LUE deficits/detail RUE Deficits / Details: shoulder 4-/5, elbow 3+/5, wrist 3+/5, 3+/5 gross grasp, flexed1-3 digits unable to extend RUE Coordination: decreased fine motor LUE Deficits /  Details: shoulder 3+/5, elbow 3+/5, wrist 3+/5, first finger flexed LUE Coordination: decreased fine motor   Lower Extremity Assessment Lower Extremity Assessment: Defer to PT evaluation   Cervical / Trunk Assessment Cervical / Trunk Assessment: Other exceptions(weakness)   Communication Communication Communication: No difficulties   Cognition Arousal/Alertness: Awake/alert Behavior During Therapy: WFL for tasks assessed/performed Overall Cognitive Status: Within Functional Limits for tasks assessed                                 General Comments: pt cooperative and in good spirits   General Comments  RLE MMT- 0/5 DF/PF, 2-/5 knee extension, 0/5 knee flexion, 2/5 hip flexion; LLE MMT- 2+/5 knee extension/flexion, 2+/5 DF/PF, 2+/5 hip flexion. Sensation decreased in bil feet. Impaired proprioception BLEs.     Exercises Other Exercises Other Exercises: Scapular retraction x6 sittng EOB with decreased activation right side.    Shoulder Instructions      Home Living Family/patient expects to be discharged to:: Private residence Living Arrangements: Alone Available Help at Discharge: Neighbor;Available PRN/intermittently Type of Home: Apartment Home Access: Stairs to enter Entrance Stairs-Number of Steps: 1 Entrance Stairs-Rails: None Home Layout: One level     Bathroom Shower/Tub: Chief Strategy Officer: Handicapped height     Home Equipment: Environmental consultant - 2 wheels;Cane - single point          Prior Functioning/Environment Level of Independence: Independent        Comments: Dietitian; Drives.         OT Problem List: Decreased strength;Decreased activity tolerance;Decreased range of motion;Impaired balance (sitting and/or standing);Decreased coordination;Decreased knowledge of use of DME or AE;Impaired UE functional use;Obesity      OT Treatment/Interventions: Self-care/ADL training;DME and/or AE instruction;Balance  training;Therapeutic activities;Therapeutic exercise;Manual therapy;Neuromuscular education;Energy conservation;Patient/family education    OT Goals(Current goals can be found in the care plan section) Acute Rehab OT Goals Patient Stated Goal: to return to PLOF and be able to walk OT Goal Formulation: With patient Time For Goal Achievement: 04/09/17 Potential to Achieve Goals: Good  OT Frequency: Min 3X/week   Barriers to D/C:            Co-evaluation PT/OT/SLP Co-Evaluation/Treatment: Yes Reason for Co-Treatment: For patient/therapist safety PT goals addressed during session: Mobility/safety with mobility;Strengthening/ROM;Balance OT goals addressed during session: ADL's and self-care      AM-PAC PT "6 Clicks" Daily Activity     Outcome Measure Help from another person eating meals?: A Little Help from another person taking care of personal grooming?: A Little Help from another person toileting, which includes using toliet, bedpan, or urinal?: Total Help from another person bathing (including washing, rinsing, drying)?: A Lot Help from another person to put on and taking off regular upper body clothing?: A Lot Help from another person to put on and taking off regular lower body clothing?: Total 6 Click Score: 12   End of Session Nurse Communication: Need for lift equipment  Activity Tolerance: Patient tolerated treatment well Patient left: in bed;with call bell/phone within reach;with bed alarm set  OT Visit Diagnosis: Muscle weakness (generalized) (M62.81)  Time: 2671-2458 OT Time Calculation (min): 37 min Charges:  OT General Charges $OT Visit: 1 Visit OT Evaluation $OT Re-eval: 1 Re-eval G-Codes:     Evern Bio 03/26/2017, 12:28 PM  03/26/2017 Martie Round, OTR/L Pager: [phone]

## 2017-03-27 DIAGNOSIS — I480 Paroxysmal atrial fibrillation: Secondary | ICD-10-CM | POA: Diagnosis present

## 2017-03-27 DIAGNOSIS — I4819 Other persistent atrial fibrillation: Secondary | ICD-10-CM

## 2017-03-27 HISTORY — DX: Other persistent atrial fibrillation: I48.19

## 2017-03-27 LAB — GLUCOSE, CAPILLARY
GLUCOSE-CAPILLARY: 186 mg/dL — AB (ref 65–99)
GLUCOSE-CAPILLARY: 234 mg/dL — AB (ref 65–99)
Glucose-Capillary: 362 mg/dL — ABNORMAL HIGH (ref 65–99)
Glucose-Capillary: 371 mg/dL — ABNORMAL HIGH (ref 65–99)

## 2017-03-27 LAB — BASIC METABOLIC PANEL
Anion gap: 19 — ABNORMAL HIGH (ref 5–15)
BUN: 24 mg/dL — ABNORMAL HIGH (ref 6–20)
CHLORIDE: 97 mmol/L — AB (ref 101–111)
CO2: 22 mmol/L (ref 22–32)
CREATININE: 1.03 mg/dL (ref 0.61–1.24)
Calcium: 8 mg/dL — ABNORMAL LOW (ref 8.9–10.3)
GFR calc Af Amer: >60 mL/min (ref >60)
GLUCOSE: 266 mg/dL — AB (ref 65–99)
POTASSIUM: 4.1 mmol/L (ref 3.5–5.1)
Sodium: 138 mmol/L (ref 135–145)

## 2017-03-27 LAB — TSH: TSH: 0.725 u[IU]/mL (ref 0.350–4.500)

## 2017-03-27 LAB — T4, FREE: Free T4: 1.03 ng/dL (ref 0.61–1.12)

## 2017-03-27 MED ORDER — METOPROLOL TARTRATE 5 MG/5ML IV SOLN
5.0000 mg | INTRAVENOUS | Status: AC | PRN
  Administered 2017-03-27 (×2): 5 mg via INTRAVENOUS
  Filled 2017-03-27 (×2): qty 5

## 2017-03-27 MED ORDER — DILTIAZEM LOAD VIA INFUSION
15.0000 mg | Freq: Once | INTRAVENOUS | Status: DC
  Filled 2017-03-27: qty 15

## 2017-03-27 MED ORDER — METOPROLOL TARTRATE 5 MG/5ML IV SOLN
5.0000 mg | INTRAVENOUS | Status: AC | PRN
  Administered 2017-03-27 (×2): 5 mg via INTRAVENOUS
  Filled 2017-03-27: qty 5

## 2017-03-27 MED ORDER — METOPROLOL TARTRATE 5 MG/5ML IV SOLN
INTRAVENOUS | Status: AC
  Administered 2017-03-27: 5 mg
  Filled 2017-03-27: qty 5

## 2017-03-27 MED ORDER — METOPROLOL TARTRATE 5 MG/5ML IV SOLN
5.0000 mg | Freq: Three times a day (TID) | INTRAVENOUS | Status: DC | PRN
  Administered 2017-04-02 (×2): 5 mg via INTRAVENOUS
  Filled 2017-03-27 (×2): qty 5

## 2017-03-27 MED ORDER — DILTIAZEM HCL 100 MG IV SOLR
5.0000 mg/h | INTRAVENOUS | Status: DC
  Administered 2017-03-28: 13 mg/h via INTRAVENOUS
  Administered 2017-03-28: 10 mg/h via INTRAVENOUS
  Administered 2017-03-29: 13 mg/h via INTRAVENOUS
  Filled 2017-03-27 (×4): qty 100

## 2017-03-27 NOTE — Progress Notes (Signed)
RN received call from CCMD that patients heart rate is 170, Nurse notified MD, Verbal order received to administer 5 mg lopressor, reassess HR and BP. Lopressor given,  Pt's heart rate and BP re-asseed and documented. MD notified

## 2017-03-27 NOTE — Progress Notes (Signed)
Patient heart was between 130's to 150's. Dr on call notified order given for IV metoprolol x 2, will continue monitor.

## 2017-03-27 NOTE — Progress Notes (Signed)
PROGRESS NOTE    Dylan [name]   YSA:[id number]  DOB: 20-Oct-1965  DOA: 03/21/2017 PCP: Patient, No Pcp Per   Brief Narrative:  Dylan [name] patient is a 52 year old male with past medical history of diabetes mellitus, hypertension and morbid obesity who started 7 months ago noted problems with right-sided weakness, his leg more than his arm. An MRI of the lumbar spine noted diffuse heterogeneous marrow signal abnormalities throughout the spine and pelvis and lower thoracic ribs concerning for multiple myeloma or metastatic disease as well as severe canal stenosis. Patient then underwent a cervical and thoracic MRI which noted diffusely abnormal bone marrow signals and scattered lesions throughout the cervical and thoracic cord as well as in the visualized pollens, medullary and cerebellum.   Neurology reviewed findings of MRI of spine with neurosurgery. It is now felt that the patient's lesions seen on MRI are consistent with MS, however his acute neurological weakness is more consistent with severe spinal stenosis at multiple levels.   Subjective: He is tearful today and feels that he is not making much progress. He is able to move his legs better today than he did yesterday. He has no pain.  ROS: no complaints of nausea, vomiting, constipation diarrhea, cough, dyspnea or dysuria. No other complaints.   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 -The patient has been evaluated by neurosurgery and underwent a Corpectomy for decompression at C6, placement of an intervertebral biomechanical device, and arthrodesis at C5-C7  - working with PT- will need CIR vs SNF  Active Problems:   A-fib with RVR - had a short lived episode last night for which he was given IV Metoprolol - check 2 D ECHO and thyroid functions - of note, high dose steroids have been noted to be associated with A-fib - cont to monitor on  telemetry  Multiple sclerosis - This is a new diagnosis for the patient and he was started on IV steroids for 5 days by neurology on 2/18- can stop today  -The patient will follow-up as outpatient with [facility] neurology, Dr. Felecia Shelling   Uncontrolled diabetes mellitus type 2 without complications  -Continue Lantus-dosage increased to 35 units at bedtime as sugars were quite elevated due to steroids-has been placed on a resistant sliding scale as well -Metformin is on hold - A1c is 8.4   Hypertension -Continue lisinopril-also on PRN hydralazine   Morbid obesity   Body mass index is 35.13 kg/m.   DVT prophylaxis: [place] dictation Ds  code Status:   Full code Family Communication:  Disposition Plan: Continue to follow in the hospital Consultants:   Neurology  Neurosurgery Procedures:  Cervical decompression  Antimicrobials:  Anti-infectives (From admission, onward)   Start     Dose/Rate Route Frequency Ordered Stop   03/25/17 1900  ceFAZolin (ANCEF) IVPB 2g/100 mL premix     2 g 200 mL/hr over 30 Minutes Intravenous Every 8 hours 03/25/17 1830 03/26/17 2227   03/25/17 1404  bacitracin 50,000 Units in sodium chloride irrigation 0.9 % 500 mL irrigation  Status:  Discontinued       As needed 03/25/17 1404 03/25/17 1551   03/25/17 1200  ceFAZolin (ANCEF) 3 g in dextrose 5 % 50 mL IVPB     3 g 130 mL/hr over 30 Minutes Intravenous 30 min pre-op 03/24/17 1535 03/25/17 1325       Objective: Vitals:   03/27/17 0411 03/27/17 0434 03/27/17 0526 03/27/17  1153  BP: (!) 125/96 125/89 127/80 109/84  Pulse:    76  Resp:    18  Temp:    98.3 F (36.8 C)  TempSrc:    Axillary  SpO2:    97%  Weight:      Height:        Intake/Output Summary (Last 24 hours) at 03/27/2017 1339 Last data filed at 03/26/2017 2100 Gross per 24 hour  Intake -  Output 1800 ml  Net -1800 ml   Filed Weights   03/21/17 1713 03/22/17 0839  Weight: (!) 158.8 kg (350 lb) 130.9 kg (288 lb 9.3 oz)     Examination: General exam: Appears comfortable  HEENT: PERRLA, oral mucosa moist, no sclera icterus or thrush Respiratory system: Clear to auscultation. Respiratory effort normal. Cardiovascular system: S1 & S2 heard, RRR.  No murmurs  Gastrointestinal system: Abdomen soft, non-tender, nondistended. Normal bowel sound. No organomegaly Central nervous system: Alert and oriented.  Strength in lower extremities is improving-  4/5 today Extremities: No cyanosis, clubbing or edema Skin: No rashes or ulcers Psychiatry:  Depressed, tearful    Data Reviewed: I have personally reviewed following labs and imaging studies  CBC: Recent Labs  Lab 03/21/17 0853 03/22/17 0152  WBC 8.2 7.5  NEUTROABS 6.0 4.5  HGB 13.9 13.4  HCT 39.5 38.6*  MCV 85.5 87.1  PLT 258 497   Basic Metabolic Panel: Recent Labs  Lab 03/21/17 0853 03/22/17 0152 03/23/17 0427 03/25/17 0414  NA 140 140 138 137  K 3.9 3.6 4.2 4.0  CL 106 106 105 105  CO2 25 22 21* 22  GLUCOSE 179* 252* 283* 277*  BUN 9 8 11 16   CREATININE 0.84 1.22 0.90 0.97  CALCIUM 9.4 8.8* 9.2 8.8*   GFR: Estimated Creatinine Clearance: 136 mL/min (by C-G formula based on SCr of 0.97 mg/dL). Liver Function Tests: Recent Labs  Lab 03/21/17 0853 03/22/17 1618  AST 36  --   ALT 29  --   ALKPHOS 64  --   BILITOT 1.2  --   PROT 7.5  --   ALBUMIN 4.1 4.1   No results for input(s): LIPASE, AMYLASE in the last 168 hours. No results for input(s): AMMONIA in the last 168 hours. Coagulation Profile: No results for input(s): INR, PROTIME in the last 168 hours. Cardiac Enzymes: No results for input(s): CKTOTAL, CKMB, CKMBINDEX, TROPONINI in the last 168 hours. BNP (last 3 results) No results for input(s): PROBNP in the last 8760 hours. HbA1C: No results for input(s): HGBA1C in the last 72 hours. CBG: Recent Labs  Lab 03/26/17 1140 03/26/17 1707 03/26/17 2212 03/27/17 0613 03/27/17 1135  GLUCAP 307* 454* 190* 186* 234*    Lipid Profile: No results for input(s): CHOL, HDL, LDLCALC, TRIG, CHOLHDL, LDLDIRECT in the last 72 hours. Thyroid Function Tests: No results for input(s): TSH, T4TOTAL, FREET4, T3FREE, THYROIDAB in the last 72 hours. Anemia Panel: No results for input(s): VITAMINB12, FOLATE, FERRITIN, TIBC, IRON, RETICCTPCT in the last 72 hours. Urine analysis: No results found for: COLORURINE, APPEARANCEUR, LABSPEC, PHURINE, GLUCOSEU, HGBUR, BILIRUBINUR, KETONESUR, PROTEINUR, UROBILINOGEN, NITRITE, LEUKOCYTESUR Sepsis Labs: @LABRCNTIP (procalcitonin:4,lacticidven:4) ) Recent Results (from the past 240 hour(s))  Anaerobic culture     Status: None (Preliminary result)   Collection Time: 03/22/17  4:30 PM  Result Value Ref Range Status   Specimen Description CSF  Final   Special Requests NONE  Final   Culture   Final    NO GROWTH < 24 HOURS Performed at  [facility], [place] [address], [place], [place] [address]    Report Status PENDING  Incomplete  CSF culture     Status: None   Collection Time: 03/22/17  4:30 PM  Result Value Ref Range Status   Specimen Description CSF  Final   Special Requests NONE  Final   Gram Stain   Final    WBC PRESENT, PREDOMINANTLY MONONUCLEAR NO ORGANISMS SEEN CYTOSPIN SMEAR    Culture   Final    NO GROWTH 3 DAYS Performed at [facility], [place] [address], [place], [place] [address]    Report Status 03/26/2017 FINAL  Final  Fungus Culture With Stain     Status: None (Preliminary result)   Collection Time: 03/22/17  4:30 PM  Result Value Ref Range Status   Fungus Stain Final report  Final    Comment: (NOTE) Performed At: [facility] [place], [place] [id number] Rush Farmer MD BS:[id number]    Fungus (Mycology) Culture PENDING  Incomplete   Fungal Source CSF  Final  Fungus Culture Result     Status: None   Collection Time: 03/22/17  4:30 PM  Result Value Ref Range Status   Result 1 Comment  Final    Comment:  (NOTE) KOH/Calcofluor preparation:  no fungus observed. Performed At: [facility] [place], [place] [id number] Rush Farmer MD SV:[id number]   Surgical PCR screen     Status: Abnormal   Collection Time: 03/24/17 10:26 PM  Result Value Ref Range Status   MRSA, PCR NEGATIVE NEGATIVE Final   Staphylococcus aureus POSITIVE (A) NEGATIVE Final    Comment: (NOTE) The Xpert SA Assay (FDA approved for NASAL specimens in patients 50 years of age and older), is one component of a comprehensive surveillance program. It is not intended to diagnose infection nor to guide or monitor treatment. Performed at [facility], [place] [address] [place], [place] [address]          Radiology Studies: Dg Cervical Spine 1 View  Result Date: 03/25/2017 CLINICAL DATA:  Cervical spine fusion. EXAM: DG C-ARM 61-120 MIN; DG CERVICAL SPINE - 1 VIEW COMPARISON:  03/23/2017. FINDINGS: Lower cervical anterior and interbody fusion. Hardware intact. Anatomic alignment. Surgical sponges noted over the lower anterior neck. IMPRESSION: Lower cervical anterior interbody fusion with anatomic alignment. Electronically Signed   By: Marcello Moores  Register   On: 03/25/2017 15:47   Dg C-arm 1-60 Min  Result Date: 03/25/2017 CLINICAL DATA:  Cervical spine fusion. EXAM: DG C-ARM 61-120 MIN; DG CERVICAL SPINE - 1 VIEW COMPARISON:  03/23/2017. FINDINGS: Lower cervical anterior and interbody fusion. Hardware intact. Anatomic alignment. Surgical sponges noted over the lower anterior neck. IMPRESSION: Lower cervical anterior interbody fusion with anatomic alignment. Electronically Signed   By: Marcello Moores  Register   On: 03/25/2017 15:47      Scheduled Meds: . B-complex with vitamin C  1 tablet Oral Daily  . Chlorhexidine Gluconate Cloth  6 each Topical Daily  . insulin aspart  0-20 Units Subcutaneous TID WC  . insulin aspart  0-5 Units Subcutaneous QHS  . insulin glargine  35 Units Subcutaneous QHS  .  lisinopril  10 mg Oral Daily  . LORazepam  1 mg Intravenous Once  . mupirocin ointment  1 application Nasal BID  . pantoprazole  40 mg Oral Daily  . sodium chloride flush  3 mL Intravenous Q12H  . sodium chloride flush  3 mL Intravenous Q12H  . sodium chloride  flush  3 mL Intravenous Q12H   Continuous Infusions: . sodium chloride    . sodium chloride    . lactated ringers    . methocarbamol (ROBAXIN)  IV    . methylPREDNISolone (SOLU-MEDROL) injection 1,000 mg (03/27/17 1035)     LOS: 6 days    Time spent in minutes: 35    Debbe Odea, MD [facility] Pager: [email or web address] Password [facility] 03/27/2017, 1:39 PM

## 2017-03-27 NOTE — NC FL2 (Signed)
[place] MEDICAID FL2 LEVEL OF CARE SCREENING TOOL     IDENTIFICATION  Patient Name: Dylan [name] Birthdate: [birth year] Sex: male Admission Date (Current Location): 03/21/2017  [facility] and [place] Number:  Producer, television/film/video and Address:  [facility] [place], [address], [place], [place] [address]      Provider Number: [id number]  Attending Physician Name and Address:  Calvert Cantor, MD  Relative Name and Phone Number:       Current Level of Care: Hospital Recommended Level of Care: Skilled Nursing Facility Prior Approval Number:    Date Approved/Denied:   PASRR Number: [id number]  Discharge Plan: SNF    Current Diagnoses: Patient Active Problem List   Diagnosis Date Noted  . Paroxysmal atrial fibrillation (HCC)   . Spinal stenosis, multiple sites in spine 03/23/2017  . Acute right-sided weakness 03/22/2017  . Morbid obesity (HCC) 03/22/2017  . Uncontrolled diabetes mellitus type 2 without complications (HCC) 03/21/2017  . Hypertension 03/21/2017  . 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 Spinal cord lesion (HCC) 03/21/2017  . Bone lesion 03/21/2017    Orientation RESPIRATION BLADDER Height & Weight     Self, Time, Situation, Place  Normal Continent Weight: 288 lb 9.3 oz (130.9 kg) Height:  6\' 4"  (193 cm)  BEHAVIORAL SYMPTOMS/MOOD NEUROLOGICAL BOWEL NUTRITION STATUS      Continent (Please see d/c summary)  AMBULATORY STATUS COMMUNICATION OF NEEDS Skin   Extensive Assist Verbally Surgical wounds(Closed incision lower back. Closed incision neck, honeycomb dressing)                       Personal Care Assistance Level of Assistance  Dressing, Feeding, Bathing Bathing Assistance: Maximum assistance Feeding assistance: Independent Dressing Assistance: Maximum assistance     Functional Limitations Info  Sight, Speech, Hearing Sight Info: Adequate Hearing Info: Adequate Speech Info: Adequate    SPECIAL CARE FACTORS FREQUENCY  PT  (By licensed PT), OT (By licensed OT)     PT Frequency: 3x OT Frequency: 3x            Contractures Contractures Info: Not present    Additional Factors Info  Code Status, Allergies Code Status Info: Full Code Allergies Info: No known allergies           Current Medications (03/27/2017):  This is the current hospital active medication list Current Facility-Administered Medications  Medication Dose Route Frequency Provider Last Rate Last Dose  . 0.9 %  sodium chloride infusion  250 mL Intravenous PRN Opyd, Lavone Neri, MD      . 0.9 %  sodium chloride infusion  250 mL Intravenous Continuous Lisbeth Renshaw, MD      . acetaminophen (TYLENOL) tablet 650 mg  650 mg Oral Q6H PRN Opyd, Lavone Neri, MD       Or  . acetaminophen (TYLENOL) suppository 650 mg  650 mg Rectal Q6H PRN Opyd, Lavone Neri, MD      . B-complex with vitamin C tablet 1 tablet  1 tablet Oral Daily Opyd, Lavone Neri, MD   1 tablet at 03/27/17 1030  . bisacodyl (DULCOLAX) EC tablet 5 mg  5 mg Oral Daily PRN Opyd, Lavone Neri, MD   5 mg at 03/27/17 0535  . Chlorhexidine Gluconate Cloth 2 % PADS 6 each  6 each Topical Daily Calvert Cantor, MD   6 each at 03/25/17 0957  . hydrALAZINE (APRESOLINE) tablet 10 mg  10 mg Oral Q8H PRN Hollice Espy, MD  10 mg at 03/23/17 0912  . HYDROcodone-acetaminophen (NORCO/VICODIN) 5-325 MG per tablet 1-2 tablet  1-2 tablet Oral Q4H PRN Opyd, Lavone Neri, MD   2 tablet at 03/27/17 0439  . insulin aspart (novoLOG) injection 0-20 Units  0-20 Units Subcutaneous TID WC Calvert Cantor, MD   7 Units at 03/27/17 1200  . insulin aspart (novoLOG) injection 0-5 Units  0-5 Units Subcutaneous QHS Opyd, Lavone Neri, MD   5 Units at 03/25/17 2227  . insulin glargine (LANTUS) injection 35 Units  35 Units Subcutaneous QHS Calvert Cantor, MD   35 Units at 03/26/17 2228  . lactated ringers infusion   Intravenous Continuous Dorris Singh, MD      . lisinopril (PRINIVIL,ZESTRIL) tablet 10 mg  10 mg Oral Daily  Hollice Espy, MD   10 mg at 03/27/17 1030  . LORazepam (ATIVAN) injection 1 mg  1 mg Intravenous Once Lorre Nick, MD      . menthol-cetylpyridinium (CEPACOL) lozenge 3 mg  1 lozenge Oral PRN Lisbeth Renshaw, MD       Or  . phenol (CHLORASEPTIC) mouth spray 1 spray  1 spray Mouth/Throat PRN Lisbeth Renshaw, MD      . methocarbamol (ROBAXIN) tablet 500 mg  500 mg Oral Q6H PRN Lisbeth Renshaw, MD   500 mg at 03/26/17 2008   Or  . methocarbamol (ROBAXIN) 500 mg in dextrose 5 % 50 mL IVPB  500 mg Intravenous Q6H PRN Lisbeth Renshaw, MD      . mupirocin ointment (BACTROBAN) 2 % 1 application  1 application Nasal BID Calvert Cantor, MD   1 application at 03/27/17 1024  . ondansetron (ZOFRAN) tablet 4 mg  4 mg Oral Q6H PRN Opyd, Lavone Neri, MD       Or  . ondansetron (ZOFRAN) injection 4 mg  4 mg Intravenous Q6H PRN Opyd, Lavone Neri, MD      . pantoprazole (PROTONIX) EC tablet 40 mg  40 mg Oral Daily Hollice Espy, MD   40 mg at 03/27/17 1030  . senna-docusate (Senokot-S) tablet 1 tablet  1 tablet Oral QHS PRN Opyd, Lavone Neri, MD      . sodium chloride flush (NS) 0.9 % injection 3 mL  3 mL Intravenous Q12H Opyd, Lavone Neri, MD   3 mL at 03/23/17 2148  . sodium chloride flush (NS) 0.9 % injection 3 mL  3 mL Intravenous Q12H Opyd, Lavone Neri, MD   3 mL at 03/24/17 2154  . sodium chloride flush (NS) 0.9 % injection 3 mL  3 mL Intravenous PRN Opyd, Lavone Neri, MD      . sodium chloride flush (NS) 0.9 % injection 3 mL  3 mL Intravenous Q12H Nundkumar, Marlane Hatcher, MD      . sodium chloride flush (NS) 0.9 % injection 3 mL  3 mL Intravenous PRN Lisbeth Renshaw, MD         Discharge Medications: Please see discharge summary for a list of discharge medications.  Relevant Imaging Results:  Relevant Lab Results:   Additional Information SSN: [id number]  Maree Krabbe, LCSW

## 2017-03-28 ENCOUNTER — Inpatient Hospital Stay (HOSPITAL_COMMUNITY): Payer: Medicaid Other

## 2017-03-28 DIAGNOSIS — R29898 Other symptoms and signs involving the musculoskeletal system: Secondary | ICD-10-CM

## 2017-03-28 DIAGNOSIS — I4891 Unspecified atrial fibrillation: Secondary | ICD-10-CM | POA: Diagnosis present

## 2017-03-28 LAB — BASIC METABOLIC PANEL
Anion gap: 10 (ref 5–15)
BUN: 24 mg/dL — ABNORMAL HIGH (ref 6–20)
CHLORIDE: 102 mmol/L (ref 101–111)
CO2: 25 mmol/L (ref 22–32)
CREATININE: 1.07 mg/dL (ref 0.61–1.24)
Calcium: 8.4 mg/dL — ABNORMAL LOW (ref 8.9–10.3)
GFR calc Af Amer: >60 mL/min (ref >60)
GFR calc non Af Amer: >60 mL/min (ref >60)
GLUCOSE: 167 mg/dL — AB (ref 65–99)
Potassium: 3.9 mmol/L (ref 3.5–5.1)
Sodium: 137 mmol/L (ref 135–145)

## 2017-03-28 LAB — GLUCOSE, CAPILLARY
GLUCOSE-CAPILLARY: 297 mg/dL — AB (ref 65–99)
GLUCOSE-CAPILLARY: 279 mg/dL — AB (ref 65–99)
Glucose-Capillary: 207 mg/dL — ABNORMAL HIGH (ref 65–99)
Glucose-Capillary: 183 mg/dL — ABNORMAL HIGH (ref 65–99)

## 2017-03-28 LAB — CBC
HCT: 43.8 % (ref 39.0–52.0)
Hemoglobin: 15.5 g/dL (ref 13.0–17.0)
MCH: 30.7 pg (ref 26.0–34.0)
MCHC: 35.4 g/dL (ref 30.0–36.0)
MCV: 86.7 fL (ref 78.0–100.0)
Platelets: 208 10*3/uL (ref 150–400)
RBC: 5.05 MIL/uL (ref 4.22–5.81)
RDW: 13.1 % (ref 11.5–15.5)
WBC: 13.2 10*3/uL — AB (ref 4.0–10.5)

## 2017-03-28 LAB — APTT: APTT: 27 s (ref 24–36)

## 2017-03-28 LAB — PROTIME-INR
INR: 1.36
Prothrombin Time: 16.6 seconds — ABNORMAL HIGH (ref 11.4–15.2)

## 2017-03-28 LAB — HEPARIN LEVEL (UNFRACTIONATED): HEPARIN UNFRACTIONATED: 0.72 [IU]/mL — AB (ref 0.30–0.70)

## 2017-03-28 MED ORDER — DIGOXIN 0.25 MG/ML IJ SOLN
0.1250 mg | Freq: Once | INTRAMUSCULAR | Status: AC
  Administered 2017-03-28: 0.125 mg via INTRAVENOUS
  Filled 2017-03-28: qty 0.5

## 2017-03-28 MED ORDER — DILTIAZEM HCL 60 MG PO TABS
60.0000 mg | ORAL_TABLET | Freq: Three times a day (TID) | ORAL | Status: DC
  Administered 2017-03-28 – 2017-03-30 (×6): 60 mg via ORAL
  Filled 2017-03-28 (×9): qty 1

## 2017-03-28 MED ORDER — HEPARIN (PORCINE) IN NACL 100-0.45 UNIT/ML-% IJ SOLN
1800.0000 [IU]/h | INTRAMUSCULAR | Status: DC
  Administered 2017-03-28: 1800 [IU]/h via INTRAVENOUS
  Filled 2017-03-28: qty 250

## 2017-03-28 MED ORDER — DIGOXIN 0.25 MG/ML IJ SOLN
0.2500 mg | Freq: Once | INTRAMUSCULAR | Status: AC
  Administered 2017-03-28: 0.25 mg via INTRAVENOUS
  Filled 2017-03-28: qty 1

## 2017-03-28 MED ORDER — HEPARIN (PORCINE) IN NACL 100-0.45 UNIT/ML-% IJ SOLN
1100.0000 [IU]/h | INTRAMUSCULAR | Status: DC
  Administered 2017-03-28 – 2017-03-29 (×2): 1650 [IU]/h via INTRAVENOUS
  Administered 2017-03-30: 1150 [IU]/h via INTRAVENOUS
  Filled 2017-03-28 (×2): qty 250

## 2017-03-28 NOTE — Progress Notes (Signed)
PROGRESS NOTE    Dylan [name]   EYC:[id number]  DOB: 11-26-1965  DOA: 03/21/2017 PCP: Patient, No Pcp Per   Brief Narrative:  Dylan [name] patient is a 52 year old male with past medical history of diabetes mellitus, hypertension and morbid obesity who started 7 months ago noted problems with right-sided weakness, his leg more than his arm. An MRI of the lumbar spine noted diffuse heterogeneous marrow signal abnormalities throughout the spine and pelvis and lower thoracic ribs concerning for multiple myeloma or metastatic disease as well as severe canal stenosis. Patient then underwent a cervical and thoracic MRI which noted diffusely abnormal bone marrow signals and scattered lesions throughout the cervical and thoracic cord as well as in the visualized pollens, medullary and cerebellum.   Neurology reviewed findings of MRI of spine with neurosurgery. It is now felt that the patient's lesions seen on MRI are consistent with MS, however his acute neurological weakness is more consistent with severe spinal stenosis at multiple levels.   Subjective: Noted to have A-fib with RVR yesterday and through the night ROS: no complaints of nausea, vomiting, constipation diarrhea, cough, dyspnea or dysuria. No other complaints.   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 -The patient has been evaluated by neurosurgery and underwent a Corpectomy for decompression at C6, placement of an intervertebral biomechanical device, and arthrodesis at C5-C7  - working with PT- will need CIR vs SNF  Active Problems:   A-fib with RVR - currently on a Cardizem infusion- will start oral Cardizem with attempt to wean infusion - check 2 D ECHO and thyroid functions - of note, high dose steroids have been noted to be associated with A-fib - start heparin infusion - cont to monitor on telemetry  Multiple sclerosis - This is a new diagnosis for  the patient and he was started on IV steroids for 5 days by neurology on 2/18- can stop today  -The patient will follow-up as outpatient with [facility] neurology, Dr. Felecia Shelling   Uncontrolled diabetes mellitus type 2 without complications  -Continue Lantus-dosage increased to 35 units at bedtime as sugars were quite elevated due to steroids-has been placed on a resistant sliding scale as well -Metformin is on hold - A1c is 8.4   Hypertension -Continue lisinopril-also on PRN hydralazine   Morbid obesity   Body mass index is 35.13 kg/m.   DVT prophylaxis: SCDs - starting heparin infusion code Status:   Full code Family Communication:  Disposition Plan: Continue to follow in the hospital Consultants:   Neurology  Neurosurgery Procedures:  Cervical decompression  Antimicrobials:  Anti-infectives (From admission, onward)   Start     Dose/Rate Route Frequency Ordered Stop   03/25/17 1900  ceFAZolin (ANCEF) IVPB 2g/100 mL premix     2 g 200 mL/hr over 30 Minutes Intravenous Every 8 hours 03/25/17 1830 03/26/17 2227   03/25/17 1404  bacitracin 50,000 Units in sodium chloride irrigation 0.9 % 500 mL irrigation  Status:  Discontinued       As needed 03/25/17 1404 03/25/17 1551   03/25/17 1200  ceFAZolin (ANCEF) 3 g in dextrose 5 % 50 mL IVPB     3 g 130 mL/hr over 30 Minutes Intravenous 30 min pre-op 03/24/17 1535 03/25/17 1325       Objective: Vitals:   03/28/17 0900 03/28/17 0910 03/28/17 1122 03/28/17 1239  BP: (!) 153/136 (!) 156/78 122/72 (!) 100/58  Pulse:  Resp: 16     Temp: 99.1 F (37.3 C)  99.5 F (37.5 C) 98.4 F (36.9 C)  TempSrc: Oral  Oral   SpO2: 99%   98%  Weight:      Height:        Intake/Output Summary (Last 24 hours) at 03/28/2017 1300 Last data filed at 03/28/2017 1100 Gross per 24 hour  Intake 145.5 ml  Output 1200 ml  Net -1054.5 ml   Filed Weights   03/21/17 1713 03/22/17 0839  Weight: (!) 158.8 kg (350 lb) 130.9 kg (288 lb 9.3 oz)     Examination: General exam: Appears comfortable  HEENT: PERRLA, oral mucosa moist, no sclera icterus or thrush Respiratory system: Clear to auscultation. Respiratory effort normal. Cardiovascular system: S1 & S2 heard, IIRR.  No murmurs  Gastrointestinal system: Abdomen soft, non-tender, nondistended. Normal bowel sound. No organomegaly Central nervous system: Alert and oriented.  Strength in lower extremities is improving-  4/5 today Extremities: No cyanosis, clubbing or edema Skin: No rashes or ulcers Psychiatry:  Depressed, tearful    Data Reviewed: I have personally reviewed following labs and imaging studies  CBC: Recent Labs  Lab 03/22/17 0152  WBC 7.5  NEUTROABS 4.5  HGB 13.4  HCT 38.6*  MCV 87.1  PLT 161   Basic Metabolic Panel: Recent Labs  Lab 03/22/17 0152 03/23/17 0427 03/25/17 0414 03/27/17 1344  NA 140 138 137 138  K 3.6 4.2 4.0 4.1  CL 106 105 105 97*  CO2 22 21* 22 22  GLUCOSE 252* 283* 277* 266*  BUN _0 24*  CREATININE 1.22 0.90 0.97 1.03  CALCIUM 8.8* 9.2 8.8* 8.0*   GFR: Estimated Creatinine Clearance: 128.1 mL/min (by C-G formula based on SCr of 1.03 mg/dL). Liver Function Tests: Recent Labs  Lab 03/22/17 1618  ALBUMIN 4.1   No results for input(s): LIPASE, AMYLASE in the last 168 hours. No results for input(s): AMMONIA in the last 168 hours. Coagulation Profile: No results for input(s): INR, PROTIME in the last 168 hours. Cardiac Enzymes: No results for input(s): CKTOTAL, CKMB, CKMBINDEX, TROPONINI in the last 168 hours. BNP (last 3 results) No results for input(s): PROBNP in the last 8760 hours. HbA1C: No results for input(s): HGBA1C in the last 72 hours. CBG: Recent Labs  Lab 03/27/17 1135 03/27/17 1731 03/27/17 2127 03/28/17 0638 03/28/17 1151  GLUCAP 234* 362* 371* 297* 279*   Lipid Profile: No results for input(s): CHOL, HDL, LDLCALC, TRIG, CHOLHDL, LDLDIRECT in the last 72 hours. Thyroid Function  Tests: Recent Labs    03/27/17 1344  TSH 0.725  FREET4 1.03   Anemia Panel: No results for input(s): VITAMINB12, FOLATE, FERRITIN, TIBC, IRON, RETICCTPCT in the last 72 hours. Urine analysis: No results found for: COLORURINE, APPEARANCEUR, LABSPEC, Weir, GLUCOSEU, HGBUR, BILIRUBINUR, KETONESUR, PROTEINUR, UROBILINOGEN, NITRITE, LEUKOCYTESUR Sepsis Labs: _1 (procalcitonin:4,lacticidven:4) ) Recent Results (from the past 240 hour(s))  Anaerobic culture     Status: None (Preliminary result)   Collection Time: 03/22/17  4:30 PM  Result Value Ref Range Status   Specimen Description CSF  Final   Special Requests NONE  Final   Culture   Final    NO GROWTH < 24 HOURS Performed at [facility], [address], [place], [place] [address]    Report Status PENDING  Incomplete  CSF culture     Status: None   Collection Time: 03/22/17  4:30 PM  Result Value Ref Range Status   Specimen Description CSF  Final   Special Requests NONE  Final   Gram Stain   Final    WBC PRESENT, PREDOMINANTLY MONONUCLEAR NO ORGANISMS SEEN CYTOSPIN SMEAR    Culture   Final    NO GROWTH 3 DAYS Performed at [facility], [place] [address] [place], [place] [address]    Report Status 03/26/2017 FINAL  Final  Fungus Culture With Stain     Status: None (Preliminary result)   Collection Time: 03/22/17  4:30 PM  Result Value Ref Range Status   Fungus Stain Final report  Final    Comment: (NOTE) Performed At: [facility] [place], [place] [id number] Rush Farmer MD FO:[id number]    Fungus (Mycology) Culture PENDING  Incomplete   Fungal Source CSF  Final  Fungus Culture Result     Status: None   Collection Time: 03/22/17  4:30 PM  Result Value Ref Range Status   Result 1 Comment  Final    Comment: (NOTE) KOH/Calcofluor preparation:  no fungus observed. Performed At: [facility] [place], [place] [id number] Rush Farmer MD  JG:[id number]   Surgical PCR screen     Status: Abnormal   Collection Time: 03/24/17 10:26 PM  Result Value Ref Range Status   MRSA, PCR NEGATIVE NEGATIVE Final   Staphylococcus aureus POSITIVE (A) NEGATIVE Final    Comment: (NOTE) The Xpert SA Assay (FDA approved for NASAL specimens in patients 35 years of age and older), is one component of a comprehensive surveillance program. It is not intended to diagnose infection nor to guide or monitor treatment. Performed at [facility], [place] [address] [place], [place] [address]          Radiology Studies: No results found.    Scheduled Meds: . B-complex with vitamin C  1 tablet Oral Daily  . Chlorhexidine Gluconate Cloth  6 each Topical Daily  . diltiazem  15 mg Intravenous Once  . diltiazem  60 mg Oral Q8H  . insulin aspart  0-20 Units Subcutaneous TID WC  . insulin aspart  0-5 Units Subcutaneous QHS  . insulin glargine  35 Units Subcutaneous QHS  . lisinopril  10 mg Oral Daily  . LORazepam  1 mg Intravenous Once  . mupirocin ointment  1 application Nasal BID  . pantoprazole  40 mg Oral Daily  . sodium chloride flush  3 mL Intravenous Q12H  . sodium chloride flush  3 mL Intravenous Q12H  . sodium chloride flush  3 mL Intravenous Q12H   Continuous Infusions: . sodium chloride    . sodium chloride    . diltiazem (CARDIZEM) infusion 15 mg/hr (03/28/17 1040)  . lactated ringers    . methocarbamol (ROBAXIN)  IV       LOS: 7 days    Time spent in minutes: 35    Debbe Odea, MD [facility] Pager: [email or web address] Password [email or web address] 03/28/2017, 1:00 PM

## 2017-03-28 NOTE — Progress Notes (Signed)
ANTICOAGULATION CONSULT NOTE - Follow up Pharmacy Consult for Heparin Indication: atrial fibrillation new onset  No Known Allergies  Patient Measurements: Height: 6\' 4"  (193 cm) Weight: 288 lb 9.3 oz (130.9 kg) IBW/kg (Calculated) : 86.8 Heparin Dosing Weight: 115 kg  Vital Signs: Temp: 99.3 F (37.4 C) (02/24 1952) Temp Source: Oral (02/24 1952) BP: 102/60 (02/24 2120) Pulse Rate: 96 (02/24 2120)  Labs: Recent Labs    03/27/17 1344 03/28/17 1349 03/28/17 1823 03/28/17 2102  HGB  --  15.5  --   --   HCT  --  43.8  --   --   PLT  --  208  --   --   APTT  --  27  --   --   LABPROT  --  16.6*  --   --   INR  --  1.36  --   --   HEPARINUNFRC  --   --   --  0.72*  CREATININE 1.03  --  1.07  --     Estimated Creatinine Clearance: 123.3 mL/min (by C-G formula based on SCr of 1.07 mg/dL).   Medical History: Past Medical History:  Diagnosis Date  . 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 Diabetes mellitus without complication (HCC)   . Hypertension     Medications:  Medications Prior to Admission  Medication Sig Dispense Refill Last Dose  . b complex vitamins tablet Take 1 tablet by mouth daily.   Past Month at Unknown time  . lisinopril (PRINIVIL,ZESTRIL) 10 MG tablet Take 10 mg by mouth daily.   03/20/2017 at Unknown time  . metFORMIN (GLUCOPHAGE) 500 MG tablet Take 500 mg by mouth 2 (two) times daily with a meal.   03/20/2017 at Unknown time   Scheduled:  . B-complex with vitamin C  1 tablet Oral Daily  . Chlorhexidine Gluconate Cloth  6 each Topical Daily  . diltiazem  15 mg Intravenous Once  . diltiazem  60 mg Oral Q8H  . insulin aspart  0-20 Units Subcutaneous TID WC  . insulin aspart  0-5 Units Subcutaneous QHS  . insulin glargine  35 Units Subcutaneous QHS  . lisinopril  10 mg Oral Daily  . LORazepam  1 mg Intravenous Once  . mupirocin ointment  1 application Nasal BID  . pantoprazole  40 mg Oral Daily  . sodium chloride flush  3 mL Intravenous Q12H  . sodium  chloride flush  3 mL Intravenous Q12H  . sodium chloride flush  3 mL Intravenous Q12H    Assessment: 52 y.o male presented on 03/21/17 with generalized weakness, worse in right leg.  S/p cervical decompression/discectomy fusion C5-C6, C6-7 corpectomy on 03/25/17. MRI  consistent with Multiple sclerosis-new diagnosis.   Heparin drip started 2/24 for new onset Afib.  Last CBC on 2/18: Hgb 13.4, pltc 238 First 6 hour heparin level = 0.72 on heparin 1800 units/hr, no bolus given.  RN reports she just saw pinkish red under hand where IV is located. Flushed it and no oozing noted.  RN will monitor closely for bleeding & call Rx if bleeding noted.   Goal of Therapy:  Heparin level 0.3-0.7 units/ml Monitor platelets by anticoagulation protocol: Yes   Plan: Decrease Heparin drip rate to 1650 units/hr Check 6 hour heparin level Daily heparin level and CBC   Thank you for allowing pharmacy to be part of this patients care team. Noah Delaine, RPh Clinical Pharmacist Pager: [phone] Weekend 8A-4P [phone];  M-F x [phone] After 4P: Main  Pharmacy [phone] 03/28/2017,9:49 PM

## 2017-03-28 NOTE — Progress Notes (Signed)
Pt noted at 2000 to have HR of 121-160,  A-fib  With RVR on monitor; currently on cardizem gtt at 36ml/hr recently decreased at 1913 per day shift RN for HR in 50'-60's. Central telemetry consulted, Pt's HR has been 120's-150-60's continually throughout the day. Pt's cardizem increased to 13/hr at 2013 as per parameters allow, as BP is stable at this time. At 2120, pt's BP noted to be 102/60, HR unchanged. On call MD Bruna Potter notified, MD to examine pt's chart further.

## 2017-03-28 NOTE — Progress Notes (Signed)
Updated Dr Butler Denmark of pt. HR 147-156 and BP 122/72 manually taken. Dylan [name]

## 2017-03-28 NOTE — Plan of Care (Signed)
Patient progressing in care plan goals.   

## 2017-03-28 NOTE — Progress Notes (Signed)
ANTICOAGULATION CONSULT NOTE - Initial Consult  Pharmacy Consult for Heparin Indication: atrial fibrillation new onset  No Known Allergies  Patient Measurements: Height: 6\' 4"  (193 cm) Weight: 288 lb 9.3 oz (130.9 kg) IBW/kg (Calculated) : 86.8 Heparin Dosing Weight: 115 kg  Vital Signs: Temp: 98.4 F (36.9 C) (02/24 1239) Temp Source: Oral (02/24 1122) BP: 100/58 (02/24 1239) Pulse Rate: 70 (02/24 0400)  Labs: Recent Labs    03/27/17 1344  CREATININE 1.03    Estimated Creatinine Clearance: 128.1 mL/min (by C-G formula based on SCr of 1.03 mg/dL).   Medical History: Past Medical History:  Diagnosis Date  . 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 Diabetes mellitus without complication (HCC)   . Hypertension     Medications:  Medications Prior to Admission  Medication Sig Dispense Refill Last Dose  . b complex vitamins tablet Take 1 tablet by mouth daily.   Past Month at Unknown time  . lisinopril (PRINIVIL,ZESTRIL) 10 MG tablet Take 10 mg by mouth daily.   03/20/2017 at Unknown time  . metFORMIN (GLUCOPHAGE) 500 MG tablet Take 500 mg by mouth 2 (two) times daily with a meal.   03/20/2017 at Unknown time   Scheduled:  . B-complex with vitamin C  1 tablet Oral Daily  . Chlorhexidine Gluconate Cloth  6 each Topical Daily  . diltiazem  15 mg Intravenous Once  . diltiazem  60 mg Oral Q8H  . insulin aspart  0-20 Units Subcutaneous TID WC  . insulin aspart  0-5 Units Subcutaneous QHS  . insulin glargine  35 Units Subcutaneous QHS  . lisinopril  10 mg Oral Daily  . LORazepam  1 mg Intravenous Once  . mupirocin ointment  1 application Nasal BID  . pantoprazole  40 mg Oral Daily  . sodium chloride flush  3 mL Intravenous Q12H  . sodium chloride flush  3 mL Intravenous Q12H  . sodium chloride flush  3 mL Intravenous Q12H    Assessment: 52 y.o male presented on 03/21/17 with generalized weakness, worse in right leg.  S/p cervical decompression/discectomy fusion C5-C6, C6-7  corpectomy on 03/25/17. MRI  consistent with Multiple sclerosis-new diagnosis.  Noted to have A-fib with RVR yesterday and through the night. Pharmacy consulted to start IV heparin infusion for Afib. Last CBC on 2/18: Hgb 13.4, pltc 238.  No bleeding noted    Goal of Therapy:  Heparin level 0.3-0.7 units/ml Monitor platelets by anticoagulation protocol: Yes   Plan: Start IV Heparin infusion 1800 units/hr (~16 units/kg/hr) No bolus due to POD#3 s/p spinal surgery Check heparin level 6 hour after bolus Daily heparin level and CBC   Thank you for allowing pharmacy to be part of this patients care team. Noah Delaine, RPh Clinical Pharmacist Pager: [phone] Weekend 8A-4P [phone];  M-F x [phone] After 4P: Main Pharmacy [phone] 03/28/2017,1:11 PM

## 2017-03-28 NOTE — Progress Notes (Signed)
Patient ID: Dylan [name], male   DOB: [birth year], 52 y.o.   MRN: [id number] Subjective:  the patient is alert and pleasant. He has no complaints.  Objective: Vital signs in last 24 hours: Temp:  [97.9 F (36.6 C)-99.1 F (37.3 C)] 99.1 F (37.3 C) (02/24 0900) Pulse Rate:  [70-114] 70 (02/24 0400) Resp:  [16-20] 16 (02/24 0900) BP: (109-156)/(78-136) 156/78 (02/24 0910) SpO2:  [97 %-100 %] 99 % (02/24 0900) Estimated body mass index is 35.13 kg/m as calculated from the following:   Height as of this encounter: 6\' 4"  (1.93 m).   Weight as of this encounter: 130.9 kg (288 lb 9.3 oz).   Intake/Output from previous day: 02/23 0701 - 02/24 0700 In: 126 [P.O.:120; I.V.:6] Out: 1200 [Urine:1200] Intake/Output this shift: No intake/output data recorded.  Physical exam the patient is alert and pleasant. He is quadriparetic. His dressing is clean and dry. There is minimal swelling.  Lab Results: No results for input(s): WBC, HGB, HCT, PLT in the last 72 hours. BMET Recent Labs    03/27/17 1344  NA 138  K 4.1  CL 97*  CO2 22  GLUCOSE 266*  BUN 24*  CREATININE 1.03  CALCIUM 8.0*    Studies/Results: No results found.  Assessment/Plan: Cervical myelopathy: The patient is recovering well from surgery  Multiple sclerosis: This certainly doesn't help his recovery.  LOS: 7 days     Cristi Loron 03/28/2017, 9:41 AM

## 2017-03-28 NOTE — Progress Notes (Signed)
Complete body assessment done per protocol.  Patient's blood pressure was below 110 which was below perameters for initiation ozwan called in f cardizem drip.  Dr Butler Denmark called for an update on patient's blood pressure and heart rate.  Dr was made aware of blood pressure and cardizem drip was not able to be initated.  Charge nurse Brett Canales also aware.  Orders received.  Patient resting comfortably.  Safety and comfort measures maintained.  Call bell within reach.

## 2017-03-29 ENCOUNTER — Inpatient Hospital Stay (HOSPITAL_COMMUNITY): Payer: Medicaid Other

## 2017-03-29 LAB — CBC
HEMATOCRIT: 43 % (ref 39.0–52.0)
HEMOGLOBIN: 15 g/dL (ref 13.0–17.0)
MCH: 30.3 pg (ref 26.0–34.0)
MCHC: 34.9 g/dL (ref 30.0–36.0)
MCV: 86.9 fL (ref 78.0–100.0)
Platelets: 195 10*3/uL (ref 150–400)
RBC: 4.95 MIL/uL (ref 4.22–5.81)
RDW: 13 % (ref 11.5–15.5)
WBC: 14.4 10*3/uL — AB (ref 4.0–10.5)

## 2017-03-29 LAB — GLUCOSE, CAPILLARY
GLUCOSE-CAPILLARY: 282 mg/dL — AB (ref 65–99)
GLUCOSE-CAPILLARY: 138 mg/dL — AB (ref 65–99)
Glucose-Capillary: 117 mg/dL — ABNORMAL HIGH (ref 65–99)
Glucose-Capillary: 149 mg/dL — ABNORMAL HIGH (ref 65–99)
Glucose-Capillary: 121 mg/dL — ABNORMAL HIGH (ref 65–99)
Glucose-Capillary: 162 mg/dL — ABNORMAL HIGH (ref 65–99)

## 2017-03-29 LAB — ANAEROBIC CULTURE

## 2017-03-29 LAB — BASIC METABOLIC PANEL
ANION GAP: 10 (ref 5–15)
BUN: 24 mg/dL — AB (ref 6–20)
CHLORIDE: 103 mmol/L (ref 101–111)
CO2: 25 mmol/L (ref 22–32)
Calcium: 8.4 mg/dL — ABNORMAL LOW (ref 8.9–10.3)
Creatinine, Ser: 1 mg/dL (ref 0.61–1.24)
GFR calc non Af Amer: >60 mL/min (ref >60)
Glucose, Bld: 144 mg/dL — ABNORMAL HIGH (ref 65–99)
POTASSIUM: 4 mmol/L (ref 3.5–5.1)
SODIUM: 138 mmol/L (ref 135–145)

## 2017-03-29 LAB — HEPARIN LEVEL (UNFRACTIONATED)
HEPARIN UNFRACTIONATED: 0.87 [IU]/mL — AB (ref 0.30–0.70)
HEPARIN UNFRACTIONATED: 0.6 [IU]/mL (ref 0.30–0.70)
Heparin Unfractionated: 0.89 IU/mL — ABNORMAL HIGH (ref 0.30–0.70)

## 2017-03-29 MED ORDER — INSULIN GLARGINE 100 UNIT/ML ~~LOC~~ SOLN
28.0000 [IU] | Freq: Every day | SUBCUTANEOUS | Status: DC
  Administered 2017-03-29: 28 [IU] via SUBCUTANEOUS
  Filled 2017-03-29: qty 0.28

## 2017-03-29 MED ORDER — SODIUM CHLORIDE 0.9 % IV BOLUS (SEPSIS)
500.0000 mL | Freq: Once | INTRAVENOUS | Status: AC
  Administered 2017-03-29: 500 mL via INTRAVENOUS

## 2017-03-29 MED ORDER — AMIODARONE LOAD VIA INFUSION
150.0000 mg | Freq: Once | INTRAVENOUS | Status: AC
  Administered 2017-03-29: 150 mg via INTRAVENOUS
  Filled 2017-03-29: qty 83.34

## 2017-03-29 MED ORDER — METFORMIN HCL 500 MG PO TABS
500.0000 mg | ORAL_TABLET | Freq: Two times a day (BID) | ORAL | Status: DC
  Administered 2017-03-29 – 2017-03-30 (×3): 500 mg via ORAL
  Filled 2017-03-29 (×3): qty 1

## 2017-03-29 MED ORDER — AMIODARONE HCL IN DEXTROSE 360-4.14 MG/200ML-% IV SOLN
60.0000 mg/h | INTRAVENOUS | Status: AC
  Administered 2017-03-29 (×2): 60 mg/h via INTRAVENOUS
  Filled 2017-03-29 (×2): qty 200

## 2017-03-29 MED ORDER — AMIODARONE HCL IN DEXTROSE 360-4.14 MG/200ML-% IV SOLN
30.0000 mg/h | INTRAVENOUS | Status: AC
Start: 2017-03-29 — End: 2017-03-29
  Filled 2017-03-29: qty 200

## 2017-03-29 NOTE — Procedures (Signed)
Heart rate is too high for accurate echo measurements.  Please call department if echo needs to be done regardless of heart rate being too high [phone].

## 2017-03-29 NOTE — Progress Notes (Signed)
Occupational Therapy Treatment Patient Details Name: Dylan [name] MRN: [id number] DOB: [birth year] Today's Date: 03/29/2017    History of present illness Patient is a 52 y/o male who presents with 7 month hx of progressive weakness, Right>left, s/p 2 falls at home. 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 Cord compression at C5-6 spinal level. MRI are consistent with MS. s/p C5-6, 6-7 ACDF 2/21. PMH includes HTN, DM.   OT comments  Pt progressing towards established OT goals. However, continues to present with decreased BUE strength and poor trunk control. Pt performing oral care at EOB with Min A for grasp strength and Max A for sitting balance. Pt requiring total A for toilet hygiene at bed level after BM. HR ranging from 66-157 bpm A fib. Pt highly motivated to participate in therapy and return to PLOF and demonstrating activity tolerance to perform ADLs and exercises at EOB for ~25 minutes. Will continue to follow acutely and continue to recommend dc to CIR.   Follow Up Recommendations  CIR;Supervision/Assistance - 24 hour    Equipment Recommendations  Other (comment)(TBD in next venue )    Recommendations for Other Services      Precautions / Restrictions Precautions Precautions: Fall Precaution Comments: BLE weakness; watch HR Required Braces or Orthoses: Cervical Brace Cervical Brace: Hard collar;Other (comment)(when OOB) Restrictions Weight Bearing Restrictions: No Other Position/Activity Restrictions: Collar can be off when in bed, but on with OOB.       Mobility Bed Mobility Overal bed mobility: Needs Assistance Bed Mobility: Rolling;Sidelying to Sit;Sit to Sidelying Rolling: Mod assist;+2 for physical assistance Sidelying to sit: +2 for physical assistance;HOB elevated;Max  assist     Sit to sidelying: Max assist;+2 for physical assistance General bed mobility comments: Cues for log roll technique and to reach for rail; assist with trunk and scooting bottom to EOB. Assist to bring LEs into bed and lower trunk to get to return to supine.  Transfers Overall transfer level: Needs assistance Equipment used: Rolling walker (2 wheeled) Transfers: Sit to/from Stand Sit to Stand: +2 physical assistance;From elevated surface;Mod assist;Max assist         General transfer comment: Deferred secondary to weakness and safety concerns.     Balance Overall balance assessment: Needs assistance;History of Falls Sitting-balance support: Feet supported;Bilateral upper extremity supported Sitting balance-Leahy Scale: Poor Sitting balance - Comments: Requires BUE support to maintain static sitting balance; LOB posteriorly and in all directions requiring Max A to maintain. Able to initiate forward flexion and weight shift with assist. Not able to sustain upright but able to activate spinal extensors. Sat EOB ~25 mins. Postural control: Posterior lean;Right lateral lean;Left lateral lean                                 ADL either performed or assessed with clinical judgement   ADL Overall ADL's : Needs assistance/impaired     Grooming: Oral care;Sitting;Wash/dry face;Moderate assistance;Maximal assistance Grooming Details (indicate cue type and reason): Pt requiring Mod-max A for sitting balance at EOB during grooming task. Requiring Min A for oral care to open tooth paste due to decrease grasp strength. Pt squeezing tooth paste iwth both hands to maximize strength and effectivness. Using LUE to brush teeth since RUE weaker.  Upper Body Dressing : Maximal assistance;Sitting Upper Body Dressing Details (indicate cue type and reason): Max A to Surveyor, mining and Hygiene: Total assistance;Bed level;+2 for  safety/equipment Toileting - Clothing Manipulation Details (indicate cue type and reason): Total A for BM at bed level. Able to maintain rolling position but unable to performing toilet hygiene.       General ADL Comments: Pt performing grooming and excercises at EOB. Then required total A for toilet hygeiene at bed level. Despite low spirits due to medical change over weekend, pt highly motivated to participate in therapy     Vision   Vision Assessment?: No apparent visual deficits   Perception     Praxis      Cognition Arousal/Alertness: Awake/alert Behavior During Therapy: WFL for tasks assessed/performed Overall Cognitive Status: Within Functional Limits for tasks assessed                                 General Comments: Pt seems in low spirits today after events from the weekend.         Exercises General Exercises - Lower Extremity Long Arc Quad: AROM;Both;10 reps;Seated Other Exercises Other Exercises: Scapular retraction x10 sittng EOB with assist for balance.   Shoulder Instructions       General Comments Supine BP 88/68, Sitting BP 111/90. HR ranged from 66-157 bpm Afib with RVR    Pertinent Vitals/ Pain       Pain Assessment: No/denies pain  Home Living                                          Prior Functioning/Environment              Frequency  Min 3X/week        Progress Toward Goals  OT Goals(current goals can now be found in the care plan section)  Progress towards OT goals: Progressing toward goals  Acute Rehab OT Goals Patient Stated Goal: to return to PLOF and be able to walk OT Goal Formulation: With patient Time For Goal Achievement: 04/09/17 Potential to Achieve Goals: Good ADL Goals Pt Will Perform Grooming: with min guard assist;sitting Pt Will Perform Upper Body Bathing: with min assist;sitting Pt Will Perform Lower Body Bathing: with max assist;sitting/lateral leans Pt Will Perform Lower  Body Dressing: with max assist;sitting/lateral leans Pt Will Transfer to Toilet: with +2 assist;with mod assist;stand pivot transfer;bedside commode Pt Will Perform Toileting - Clothing Manipulation and hygiene: with 2+ total assist;with mod assist;sit to/from stand Pt/caregiver will Perform Home Exercise Program: Increased strength;Both right and left upper extremity;With theraputty;With written HEP provided  Plan Discharge plan remains appropriate    Co-evaluation    PT/OT/SLP Co-Evaluation/Treatment: Yes Reason for Co-Treatment: For patient/therapist safety PT goals addressed during session: Mobility/safety with mobility;Balance;Strengthening/ROM OT goals addressed during session: ADL's and self-care      AM-PAC PT "6 Clicks" Daily Activity     Outcome Measure   Help from another person eating meals?: A Little Help from another person taking care of personal grooming?: A Little Help from another person toileting, which includes using toliet, bedpan, or urinal?: Total Help from another person bathing (including washing, rinsing, drying)?: A Lot Help from another person to put on and taking off regular upper body clothing?:  A Lot Help from another person to put on and taking off regular lower body clothing?: Total 6 Click Score: 12    End of Session Equipment Utilized During Treatment: Gait belt;Rolling walker  OT Visit Diagnosis: Muscle weakness (generalized) (M62.81)   Activity Tolerance Patient tolerated treatment well   Patient Left in bed;with call bell/phone within reach;with bed alarm set   Nurse Communication Need for lift equipment        Time: 6045-4098 OT Time Calculation (min): 43 min  Charges: OT General Charges $OT Visit: 1 Visit OT Treatments $Self Care/Home Management : 23-37 mins  Kollyn Lingafelter MSOT, OTR/L Acute Rehab Pager: [phone] Office: [phone]   Theodoro Grist Rachelann Enloe 03/29/2017, 1:24 PM

## 2017-03-29 NOTE — Progress Notes (Signed)
Report given to  [facility]. Pt going to 6E24 due to uncontrolled  heart rate due to A-fib.

## 2017-03-29 NOTE — Progress Notes (Signed)
PROGRESS NOTE    Dylan [name]   INO:[id number]  DOB: 04-30-1965  DOA: 03/21/2017 PCP: Patient, No Pcp Per   Brief Narrative:  Dylan [name] patient is a 52 year old male with past medical history of diabetes mellitus, hypertension and morbid obesity who started 7 months ago noted problems with right-sided weakness, his leg more than his arm. An MRI of the lumbar spine noted diffuse heterogeneous marrow signal abnormalities throughout the spine and pelvis and lower thoracic ribs concerning for multiple myeloma or metastatic disease as well as severe canal stenosis. Patient then underwent a cervical and thoracic MRI which noted diffusely abnormal bone marrow signals and scattered lesions throughout the cervical and thoracic cord as well as in the visualized pollens, medullary and cerebellum.   Neurology reviewed findings of MRI of spine with neurosurgery. It is now felt that the patient's lesions seen on MRI are consistent with MS, however his acute neurological weakness is more consistent with severe spinal stenosis at multiple levels.   Subjective: Noted to have A-fib with RVR yesterday and through the night ROS: no complaints of nausea, vomiting, constipation diarrhea, cough, dyspnea or dysuria. No other complaints.   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 -The patient has been evaluated by neurosurgery and underwent a Corpectomy for decompression at C6, placement of an intervertebral biomechanical device, and arthrodesis at C5-C7  - working with PT- will need CIR vs SNF  Active Problems:   A-fib with RVR noted on 2/23 - HR was as high at 170s and did not improve with Cardizem (infusion and orals), Metoprolol or Dig  - BP was borderline which made it difficult to increase doses of medications- have started Amiodarone today and he has converted to NSR-   - 2 D ECHO is pending  - thyroid functions are normal - of  note, high dose steroids have been noted to be associated with A-fib - started heparin infusion- no signs of bleeding at surgical site on neck   Multiple sclerosis - This is a new diagnosis for the patient and he was started on IV steroids for 5 days by neurology on 2/18-  -The patient will follow-up as outpatient with [facility] neurology, Dr. Felecia Shelling   Uncontrolled diabetes mellitus type 2 without complications  -Continue Lantus-dosage increased to 35 units at bedtime as sugars were quite elevated due to high dose IV steroids-has been placed on a resistant sliding scale as well -Metformin is on hold - A1c is 8.4 - sugars should hopefully start improving as he has been off of IV steroids for 2 days now- I will resume Metformin today and cut back slightly on Lantus    Hypertension - Lisinopril on hold- BP slightly low- follow   Morbid obesity   Body mass index is 35.13 kg/m.    DVT prophylaxis: SCDs - starting heparin infusion code Status:   Full code Family Communication:  Disposition Plan: Continue to follow in the hospital Consultants:   Neurology  Neurosurgery Procedures:  Cervical decompression  Antimicrobials:  Anti-infectives (From admission, onward)   Start     Dose/Rate Route Frequency Ordered Stop   03/25/17 1900  ceFAZolin (ANCEF) IVPB 2g/100 mL premix     2 g 200 mL/hr over 30 Minutes Intravenous Every 8 hours 03/25/17 1830 03/26/17 2227   03/25/17 1404  bacitracin 50,000 Units in sodium chloride irrigation 0.9 % 500 mL irrigation  Status:  Discontinued  As needed 03/25/17 1404 03/25/17 1551   03/25/17 1200  ceFAZolin (ANCEF) 3 g in dextrose 5 % 50 mL IVPB     3 g 130 mL/hr over 30 Minutes Intravenous 30 min pre-op 03/24/17 1535 03/25/17 1325       Objective: Vitals:   03/29/17 1049 03/29/17 1307 03/29/17 1407 03/29/17 1446  BP: 120/71 113/86 105/75 104/75  Pulse:      Resp:      Temp:      TempSrc:      SpO2:      Weight:      Height:         Intake/Output Summary (Last 24 hours) at 03/29/2017 1524 Last data filed at 03/29/2017 0844 Gross per 24 hour  Intake 60 ml  Output 900 ml  Net -840 ml   Filed Weights   03/21/17 1713 03/22/17 0839  Weight: (!) 158.8 kg (350 lb) 130.9 kg (288 lb 9.3 oz)    Examination: General exam: Appears comfortable  HEENT: PERRLA, oral mucosa moist, no sclera icterus or thrush Respiratory system: Clear to auscultation. Respiratory effort normal. Cardiovascular system: S1 & S2 heard, IIRR.  No murmurs  Gastrointestinal system: Abdomen soft, non-tender, nondistended. Normal bowel sound. No organomegaly Central nervous system: Alert and oriented.  Strength in lower extremities is improving-  4/5 today Extremities: No cyanosis, clubbing or edema Skin: No rashes or ulcers   Data Reviewed: I have personally reviewed following labs and imaging studies  CBC: Recent Labs  Lab 03/28/17 1349 03/29/17 0357  WBC 13.2* 14.4*  HGB 15.5 15.0  HCT 43.8 43.0  MCV 86.7 86.9  PLT 208 767   Basic Metabolic Panel: Recent Labs  Lab 03/23/17 0427 03/25/17 0414 03/27/17 1344 03/28/17 1823 03/29/17 0357  NA 138 137 138 137 138  K 4.2 4.0 4.1 3.9 4.0  CL 105 105 97* 102 103  CO2 21* _0 GLUCOSE 283* 277* 266* 167* 144*  BUN 11 16 24* 24* 24*  CREATININE 0.90 0.97 1.03 1.07 1.00  CALCIUM 9.2 8.8* 8.0* 8.4* 8.4*   GFR: Estimated Creatinine Clearance: 132 mL/min (by C-G formula based on SCr of 1 mg/dL). Liver Function Tests: Recent Labs  Lab 03/22/17 1618  ALBUMIN 4.1   No results for input(s): LIPASE, AMYLASE in the last 168 hours. No results for input(s): AMMONIA in the last 168 hours. Coagulation Profile: Recent Labs  Lab 03/28/17 1349  INR 1.36   Cardiac Enzymes: No results for input(s): CKTOTAL, CKMB, CKMBINDEX, TROPONINI in the last 168 hours. BNP (last 3 results) No results for input(s): PROBNP in the last 8760 hours. HbA1C: No results for input(s): HGBA1C in the  last 72 hours. CBG: Recent Labs  Lab 03/28/17 1642 03/28/17 2121 03/29/17 0638 03/29/17 0947 03/29/17 1137  GLUCAP 207* 183* 117* 149* 138*   Lipid Profile: No results for input(s): CHOL, HDL, LDLCALC, TRIG, CHOLHDL, LDLDIRECT in the last 72 hours. Thyroid Function Tests: Recent Labs    03/27/17 1344  TSH 0.725  FREET4 1.03   Anemia Panel: No results for input(s): VITAMINB12, FOLATE, FERRITIN, TIBC, IRON, RETICCTPCT in the last 72 hours. Urine analysis: No results found for: COLORURINE, APPEARANCEUR, LABSPEC, [place], GLUCOSEU, HGBUR, BILIRUBINUR, KETONESUR, PROTEINUR, UROBILINOGEN, NITRITE, LEUKOCYTESUR Sepsis Labs: _1 (procalcitonin:4,lacticidven:4) ) Recent Results (from the past 240 hour(s))  Anaerobic culture     Status: None   Collection Time: 03/22/17  4:30 PM  Result Value Ref Range Status   Specimen Description CSF  Final  Special Requests NONE  Final   Culture   Final    NO ANAEROBES ISOLATED Performed at [facility], [place] [address], [place], [place] [address]    Report Status 03/29/2017 FINAL  Final  CSF culture     Status: None   Collection Time: 03/22/17  4:30 PM  Result Value Ref Range Status   Specimen Description CSF  Final   Special Requests NONE  Final   Gram Stain   Final    WBC PRESENT, PREDOMINANTLY MONONUCLEAR NO ORGANISMS SEEN CYTOSPIN SMEAR    Culture   Final    NO GROWTH 3 DAYS Performed at [facility], [place] [address] [place], [place] [address]    Report Status 03/26/2017 FINAL  Final  Fungus Culture With Stain     Status: None (Preliminary result)   Collection Time: 03/22/17  4:30 PM  Result Value Ref Range Status   Fungus Stain Final report  Final    Comment: (NOTE) Performed At: [facility] [place], [place] [id number] Rush Farmer MD [id number]    Fungus (Mycology) Culture PENDING  Incomplete   Fungal Source CSF  Final  Fungus Culture Result     Status: None   Collection  Time: 03/22/17  4:30 PM  Result Value Ref Range Status   Result 1 Comment  Final    Comment: (NOTE) KOH/Calcofluor preparation:  no fungus observed. Performed At: [facility] [place], [place] [id number] Rush Farmer MD IH:[id number]   Surgical PCR screen     Status: Abnormal   Collection Time: 03/24/17 10:26 PM  Result Value Ref Range Status   MRSA, PCR NEGATIVE NEGATIVE Final   Staphylococcus aureus POSITIVE (A) NEGATIVE Final    Comment: (NOTE) The Xpert SA Assay (FDA approved for NASAL specimens in patients 71 years of age and older), is one component of a comprehensive surveillance program. It is not intended to diagnose infection nor to guide or monitor treatment. Performed at [facility], [place] [address] [place], [place] [address]          Radiology Studies: No results found.    Scheduled Meds: . B-complex with vitamin C  1 tablet Oral Daily  . Chlorhexidine Gluconate Cloth  6 each Topical Daily  . diltiazem  15 mg Intravenous Once  . diltiazem  60 mg Oral Q8H  . insulin aspart  0-20 Units Subcutaneous TID WC  . insulin aspart  0-5 Units Subcutaneous QHS  . insulin glargine  35 Units Subcutaneous QHS  . lisinopril  10 mg Oral Daily  . mupirocin ointment  1 application Nasal BID  . pantoprazole  40 mg Oral Daily  . sodium chloride flush  3 mL Intravenous Q12H  . sodium chloride flush  3 mL Intravenous Q12H  . sodium chloride flush  3 mL Intravenous Q12H   Continuous Infusions: . sodium chloride    . sodium chloride    . amiodarone 30 mg/hr (03/29/17 1348)  . heparin 1,150 Units/hr (03/29/17 1242)  . lactated ringers    . methocarbamol (ROBAXIN)  IV       LOS: 8 days    Time spent in minutes: 35    Debbe Odea, MD [facility] Pager: [email or web address] Password [facility] 03/29/2017, 3:24 PM

## 2017-03-29 NOTE — Progress Notes (Signed)
I await medical readiness for a possible inpt rehab admit. [phone]

## 2017-03-29 NOTE — Progress Notes (Signed)
ANTICOAGULATION CONSULT NOTE - Follow Up Consult  Pharmacy Consult for Heparin Indication: atrial fibrillation  No Known Allergies  Patient Measurements: Height: 6\' 4"  (193 cm) Weight: 288 lb 9.3 oz (130.9 kg) IBW/kg (Calculated) : 86.8 Heparin Dosing Weight: 115 kg  Vital Signs: BP: 120/71 (02/25 1049) Pulse Rate: 84 (02/25 0558)  Labs: Recent Labs    03/27/17 1344 03/28/17 1349 03/28/17 1823 03/28/17 2102 03/29/17 0357 03/29/17 1040  HGB  --  15.5  --   --  15.0  --   HCT  --  43.8  --   --  43.0  --   PLT  --  208  --   --  195  --   APTT  --  27  --   --   --   --   LABPROT  --  16.6*  --   --   --   --   INR  --  1.36  --   --   --   --   HEPARINUNFRC  --   --   --  0.72* 0.89* 0.87*  CREATININE 1.03  --  1.07  --  1.00  --     Estimated Creatinine Clearance: 132 mL/min (by C-G formula based on SCr of 1 mg/dL).  Assessment:  52 yr old male presented on 03/21/17 with generalized weakness, worse in right leg.  S/p cervical decompression/discectomy fusion C5-C6, C6-7 corpectomy on 03/25/17. MRI  consistent with Multiple sclerosis-new diagnosis.     Heparin drip started 2/24 for new onset Afib.   Heparin level remains supratherapeutic (0.87) after decrease rate from 1650>1450 units/hr early this morning.  CBC stable, no known bleeding or infusion issues.   Goal of Therapy:  Heparin level 0.3-0.7 units/ml Monitor platelets by anticoagulation protocol: Yes   Plan:   Further decrease heparin drip to 1150 units/hr.  Heparin level ~6 hrs after decrease.  Daily heparin level and CBC while on heparin.  Dennie Fetters, RPh Pager: [phone]) 03/29/2017,12:46 PM

## 2017-03-29 NOTE — Progress Notes (Signed)
Pt converted to NSR.  HR in 70s.  Hinton Dyer, RN

## 2017-03-29 NOTE — Progress Notes (Signed)
Neurosurgery Progress Note  Events noted of the weekend Heparin has been started due to new onset Afib No real concerns this am Reports improved strength since surgery Denies dysphagia  EXAM:  BP 107/66 (BP Location: Left Arm)   Pulse 84   Temp 99.3 F (37.4 C) (Oral)   Resp 18   Ht 6\' 4"  (1.93 m)   Wt 130.9 kg (288 lb 9.3 oz)   SpO2 95%   BMI 35.13 kg/m   Awake, alert, oriented  Speech fluent, appropriate  CN grossly intact  MAEW with good strength Incision c/d/i, no swelling  PLAN Stable from NS perspective this am He was started on Heparin due to new onset Afib. No swelling at surgical site. Area is c/d/i.Denies dysphagia and dyspnea. This will need to be carefully monitored due to recent surgery.  No new NS recs. Can f/u 3 weeks outpt.  Please call for any concerns.

## 2017-03-29 NOTE — Progress Notes (Signed)
Physical Therapy Treatment Patient Details Name: Dylan [name] MRN: [id number] DOB: 1965-09-26 Today's Date: 03/29/2017    History of Present Illness Patient is a 52 y/o male who presents with 7 month hx of progressive weakness, Right>left, s/p 2 falls at home. 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 Cord compression at C5-6 spinal level. MRI are consistent with MS. s/p C5-6, 6-7 ACDF 2/21. PMH includes HTN, DM.    PT Comments    Patient progressing slowly towards PT goals. Not able to get OOB to chair this past weekend due to going int o A-fib with RVR. Seems to have better muscle activation in RLE today. Tolerated sitting EOB ~25 mins with Max A-Min guard assist requiring UE support. Able to initiate trunk but not able to sustain. HR ranging from 66-157 bpm A fib. Continues to have decreased trunk control and sitting balance. Will continue to follow.    Follow Up Recommendations  CIR;Supervision for mobility/OOB     Equipment Recommendations  None recommended by PT    Recommendations for Other Services       Precautions / Restrictions Precautions Precautions: Fall Precaution Comments: BLE weakness; watch HR Required Braces or Orthoses: Cervical Brace Cervical Brace: Hard collar;Other (comment)(when OOB) Restrictions Weight Bearing Restrictions: No Other Position/Activity Restrictions: Collar can be off when in bed, but on with OOB.    Mobility  Bed Mobility Overal bed mobility: Needs Assistance Bed Mobility: Rolling;Sidelying to Sit;Sit to Sidelying Rolling: Mod assist;+2 for physical assistance Sidelying to sit: +2 for physical assistance;HOB elevated;Max assist     Sit to sidelying: Max assist;+2 for physical assistance General bed mobility comments: Cues for log roll technique  and to reach for rail; assist with trunk and scooting bottom to EOB. Assist to bring LEs into bed and lower trunk to get to return to supine.  Transfers                 General transfer comment: Deferred secondary to weakness and safety concerns.   Ambulation/Gait                 Stairs            Wheelchair Mobility    Modified Rankin (Stroke Patients Only) Modified Rankin (Stroke Patients Only) Pre-Morbid Rankin Score: Slight disability Modified Rankin: Severe disability     Balance Overall balance assessment: Needs assistance;History of Falls Sitting-balance support: Feet supported;Bilateral upper extremity supported Sitting balance-Leahy Scale: Poor Sitting balance - Comments: Requires BUE support to maintain static sitting balance; LOB posteriorly and in all directions requiring Max A to maintain. Able to initiate forward flexion and weight shift with assist. Not able to sustain upright but able to activate spinal extensors. Sat EOB ~25 mins. Postural control: Posterior lean;Right lateral lean;Left lateral lean                                  Cognition Arousal/Alertness: Awake/alert Behavior During Therapy: WFL for tasks assessed/performed Overall Cognitive Status: Within Functional Limits for tasks assessed                                 General Comments: Pt seems in low spirits today after events  from the weekend.       Exercises General Exercises - Lower Extremity Long Arc Quad: AROM;Both;10 reps;Seated Other Exercises Other Exercises: Scapular retraction x10 sittng EOB with assist for balance.    General Comments General comments (skin integrity, edema, etc.): Supine BP 88/68, Sitting BP 111/90. HR ranged from 66-157 bpm Afib with RVR      Pertinent Vitals/Pain Pain Assessment: No/denies pain    Home Living                      Prior Function            PT Goals (current goals can now be found  in the care plan section) Progress towards PT goals: Progressing toward goals(slowly)    Frequency    Min 3X/week      PT Plan Current plan remains appropriate    Co-evaluation PT/OT/SLP Co-Evaluation/Treatment: Yes Reason for Co-Treatment: For patient/therapist safety;To address functional/ADL transfers PT goals addressed during session: Mobility/safety with mobility;Balance;Strengthening/ROM        AM-PAC PT "6 Clicks" Daily Activity  Outcome Measure  Difficulty turning over in bed (including adjusting bedclothes, sheets and blankets)?: Unable Difficulty moving from lying on back to sitting on the side of the bed? : Unable Difficulty sitting down on and standing up from a chair with arms (e.g., wheelchair, bedside commode, etc,.)?: Unable Help needed moving to and from a bed to chair (including a wheelchair)?: Total Help needed walking in hospital room?: Total Help needed climbing 3-5 steps with a railing? : Total 6 Click Score: 6    End of Session Equipment Utilized During Treatment: Cervical collar Activity Tolerance: Patient tolerated treatment well Patient left: in bed;with call bell/phone within reach;with bed alarm set Nurse Communication: Mobility status;Need for lift equipment PT Visit Diagnosis: Muscle weakness (generalized) (M62.81);Difficulty in walking, not elsewhere classified (R26.2);Unsteadiness on feet (R26.81);History of falling (Z91.81);Ataxic gait (R26.0)     Time: 1610-9604 PT Time Calculation (min) (ACUTE ONLY): 41 min  Charges:  $Therapeutic Activity: 8-22 mins                    G Codes:       Mylo Red, PT, DPT [phone]     Blake Divine A Tanis Hensarling 03/29/2017, 10:37 AM

## 2017-03-29 NOTE — Progress Notes (Signed)
ANTICOAGULATION CONSULT NOTE - Follow Up Consult  Pharmacy Consult for Heparin Indication: atrial fibrillation  No Known Allergies  Patient Measurements: Height: 6\' 4"  (193 cm) Weight: 288 lb 9.3 oz (130.9 kg) IBW/kg (Calculated) : 86.8 Heparin Dosing Weight: 115 kg  Vital Signs: Temp: 97.5 F (36.4 C) (02/25 1530) Temp Source: Oral (02/25 1530) BP: 118/85 (02/25 1530) Pulse Rate: 66 (02/25 1530)  Labs: Recent Labs    03/27/17 1344 03/28/17 1349 03/28/17 1823  03/29/17 0357 03/29/17 1040 03/29/17 1848  HGB  --  15.5  --   --  15.0  --   --   HCT  --  43.8  --   --  43.0  --   --   PLT  --  208  --   --  195  --   --   APTT  --  27  --   --   --   --   --   LABPROT  --  16.6*  --   --   --   --   --   INR  --  1.36  --   --   --   --   --   HEPARINUNFRC  --   --   --    < > 0.89* 0.87* 0.60  CREATININE 1.03  --  1.07  --  1.00  --   --    < > = values in this interval not displayed.    Estimated Creatinine Clearance: 132 mL/min (by C-G formula based on SCr of 1 mg/dL).  Assessment:  52 yr old male presented on 03/21/17 with generalized weakness, worse in right leg. Heparin drip started 2/24 for new onset Afib.   Heparin drip this evening is therapeutic after a rate decrease earlier today (HL 0.6 << 0.87, goal of 0.3-0.7). CBC wnl. No bleeding noted.  Goal of Therapy:  Heparin level 0.3-0.7 units/ml Monitor platelets by anticoagulation protocol: Yes   Plan:  - Continue Heparin at 1150 units/hr (11.5 ml/hr) - Will continue to monitor for any signs/symptoms of bleeding and will follow up with heparin level in 6 hours   Thank you for allowing pharmacy to be a part of this patient's care.  Georgina Pillion, PharmD, BCPS Clinical Pharmacist Pager: [phone] 03/29/2017 8:06 PM

## 2017-03-29 NOTE — Progress Notes (Signed)
ANTICOAGULATION CONSULT NOTE -  Pharmacy Consult for Heparin Indication: atrial fibrillation   No Known Allergies  Patient Measurements: Height: 6\' 4"  (193 cm) Weight: 288 lb 9.3 oz (130.9 kg) IBW/kg (Calculated) : 86.8 Heparin Dosing Weight: 115 kg  Vital Signs: Temp: 99.3 F (37.4 C) (02/24 1952) Temp Source: Oral (02/24 1952) BP: 99/70 (02/25 0354) Pulse Rate: 60 (02/25 0354)  Labs: Recent Labs    03/27/17 1344 03/28/17 1349 03/28/17 1823 03/28/17 2102 03/29/17 0357  HGB  --  15.5  --   --  15.0  HCT  --  43.8  --   --  43.0  PLT  --  208  --   --  195  APTT  --  27  --   --   --   LABPROT  --  16.6*  --   --   --   INR  --  1.36  --   --   --   HEPARINUNFRC  --   --   --  0.72* 0.89*  CREATININE 1.03  --  1.07  --   --     Estimated Creatinine Clearance: 123.3 mL/min (by C-G formula based on SCr of 1.07 mg/dL).   Medical History: Past Medical History:  Diagnosis Date  . 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 Diabetes mellitus without complication (HCC)   . Hypertension     Medications:  Medications Prior to Admission  Medication Sig Dispense Refill Last Dose  . b complex vitamins tablet Take 1 tablet by mouth daily.   Past Month at Unknown time  . lisinopril (PRINIVIL,ZESTRIL) 10 MG tablet Take 10 mg by mouth daily.   03/20/2017 at Unknown time  . metFORMIN (GLUCOPHAGE) 500 MG tablet Take 500 mg by mouth 2 (two) times daily with a meal.   03/20/2017 at Unknown time    Assessment: 52 y.o male presented on 03/21/17 with generalized weakness, worse in right leg.  S/p cervical decompression/discectomy fusion C5-C6, C6-7 corpectomy on 03/25/17. MRI  consistent with Multiple sclerosis-new diagnosis.   Heparin drip started 2/24 for new onset Afib.  Heparin level remains supratherapeutic and appears to be trending up. CBC is stable.   Goal of Therapy:  Heparin level 0.3-0.7 units/ml Monitor platelets by anticoagulation protocol: Yes   Plan: Decrease Heparin drip  rate to1450 units/hr Check 6 hour heparin level Daily heparin level and CBC   Baldemar Friday 03/29/2017 4:58 AM

## 2017-03-30 ENCOUNTER — Inpatient Hospital Stay (HOSPITAL_COMMUNITY): Payer: Medicaid Other

## 2017-03-30 DIAGNOSIS — G35D Multiple sclerosis, unspecified: Secondary | ICD-10-CM | POA: Diagnosis present

## 2017-03-30 DIAGNOSIS — G35 Multiple sclerosis: Secondary | ICD-10-CM | POA: Diagnosis present

## 2017-03-30 DIAGNOSIS — I4892 Unspecified atrial flutter: Secondary | ICD-10-CM

## 2017-03-30 LAB — GLUCOSE, CAPILLARY
GLUCOSE-CAPILLARY: 187 mg/dL — AB (ref 65–99)
GLUCOSE-CAPILLARY: 303 mg/dL — AB (ref 65–99)
Glucose-Capillary: 237 mg/dL — ABNORMAL HIGH (ref 65–99)
Glucose-Capillary: 262 mg/dL — ABNORMAL HIGH (ref 65–99)
Glucose-Capillary: 301 mg/dL — ABNORMAL HIGH (ref 65–99)
Glucose-Capillary: 291 mg/dL — ABNORMAL HIGH (ref 65–99)

## 2017-03-30 LAB — HEPARIN LEVEL (UNFRACTIONATED)
Heparin Unfractionated: 0.66 IU/mL (ref 0.30–0.70)
Heparin Unfractionated: 0.35 IU/mL (ref 0.30–0.70)

## 2017-03-30 LAB — CBC
HEMATOCRIT: 40.9 % (ref 39.0–52.0)
Hemoglobin: 14 g/dL (ref 13.0–17.0)
MCH: 30 pg (ref 26.0–34.0)
MCHC: 34.2 g/dL (ref 30.0–36.0)
MCV: 87.8 fL (ref 78.0–100.0)
Platelets: 208 10*3/uL (ref 150–400)
RBC: 4.66 MIL/uL (ref 4.22–5.81)
RDW: 13.1 % (ref 11.5–15.5)
WBC: 10.4 10*3/uL (ref 4.0–10.5)

## 2017-03-30 LAB — ECHOCARDIOGRAM COMPLETE
HEIGHTINCHES: 76 in
Weight: 4617.31 oz

## 2017-03-30 MED ORDER — ASPIRIN EC 81 MG PO TBEC: 81.0000 mg | DELAYED_RELEASE_TABLET | Freq: Every day | ORAL | 2 refills | Status: DC

## 2017-03-30 MED ORDER — DILTIAZEM HCL ER COATED BEADS 120 MG PO CP24: 120.0000 mg | ORAL_CAPSULE | Freq: Every day | ORAL | 11 refills | Status: DC

## 2017-03-30 MED ORDER — INSULIN GLARGINE 100 UNIT/ML ~~LOC~~ SOLN
35.0000 [IU] | Freq: Every day | SUBCUTANEOUS | Status: DC
  Administered 2017-03-30 – 2017-04-01 (×3): 35 [IU] via SUBCUTANEOUS
  Filled 2017-03-30 (×3): qty 0.35

## 2017-03-30 MED ORDER — HEPARIN (PORCINE) IN NACL 100-0.45 UNIT/ML-% IJ SOLN: 1000.0000 [IU]/h | INTRAMUSCULAR | Status: DC

## 2017-03-30 MED ORDER — INSULIN GLARGINE 100 UNIT/ML ~~LOC~~ SOLN: 6.0000 [IU] | Freq: Once | SUBCUTANEOUS | Status: DC

## 2017-03-30 MED ORDER — INSULIN GLARGINE 100 UNIT/ML ~~LOC~~ SOLN: 28.0000 [IU] | Freq: Every day | SUBCUTANEOUS | 11 refills | Status: DC

## 2017-03-30 MED ORDER — DILTIAZEM HCL 60 MG PO TABS
30.0000 mg | ORAL_TABLET | Freq: Once | ORAL | Status: AC
  Administered 2017-03-30: 30 mg via ORAL
  Filled 2017-03-30: qty 1

## 2017-03-30 NOTE — Progress Notes (Addendum)
ANTICOAGULATION CONSULT NOTE - Follow Up Consult  Pharmacy Consult for heparin Indication: atrial fibrillation  Labs: Recent Labs    03/27/17 1344 03/28/17 1349 03/28/17 1823  03/29/17 0357 03/29/17 1040 03/29/17 1848 03/30/17 0049  HGB  --  15.5  --   --  15.0  --   --   --   HCT  --  43.8  --   --  43.0  --   --   --   PLT  --  208  --   --  195  --   --   --   APTT  --  27  --   --   --   --   --   --   LABPROT  --  16.6*  --   --   --   --   --   --   INR  --  1.36  --   --   --   --   --   --   HEPARINUNFRC  --   --   --    < > 0.89* 0.87* 0.60 0.66  CREATININE 1.03  --  1.07  --  1.00  --   --   --    < > = values in this interval not displayed.    Assessment: 52yo male remains therapeutic on heparin though at upper end of goal.  Goal of Therapy:  Heparin level 0.3-0.7 units/ml   Plan:  Will decrease heparin gtt slightly to 1100 units/hr and check level in 6 hours.    Vernard Gambles, PharmD, BCPS  03/30/2017,1:28 AM   Addendum: After turning heparin rate down RN contacted pharmacy re: pt now experiencing hematuria.  Will hold heparin gtt x24min then decrease heparin gtt further to 1000 units/hr and check level in 6hr.  VB 3:00 AM

## 2017-03-30 NOTE — Progress Notes (Addendum)
Note: PT found the patient to be sleepy this afternoon when working with him. He is currently not strong enough to go to CIR . F/u in AM.   Calvert Cantor, MD

## 2017-03-30 NOTE — Discharge Summary (Addendum)
Physician Discharge Summary  Dylan [name]:[id number] DOB: [birth year] DOA: 03/21/2017  PCP: Patient, No Pcp Per  Admit date: 03/21/2017 Discharge date: 03/30/2017  Admitted From: home  Disposition:  CIR   Recommendations for Outpatient Follow-up:  1. 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   Brief Summary: [place] patient is a 52 year old male with past medical history of diabetes mellitus, hypertension and morbid obesity who started 7 months ago noted problems with right-sided weakness, his leg more than his arm. An MRI of the lumbar spine noted diffuse heterogeneous marrow signal abnormalities throughout the spine and pelvis and lower thoracic ribs concerning for multiple myeloma or metastatic disease as well as severe canal stenosis. Patient then underwent a cervical and thoracic MRI which noted diffusely abnormal bone marrow signals and scattered lesions throughout the cervical and thoracic cord as well as in the visualized pollens, medullary and cerebellum.  Neurology reviewed findings of MRI of spine with neurosurgery. It is now felt that the patient's lesions seen on MRI are consistent with MS, however his acute neurological weakness is more consistent with severe spinal stenosis at multiple levels.   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 -The patient has been evaluated by neurosurgery and underwent a Corpectomy for decompression at C6,  placement of an intervertebral biomechanical device, and arthrodesis at C5-C7  - Pt recommends CIR - awaiting bed  Active Problems:   A-fib with RVR noted on 2/23 - HR was as high at 170s and did not improve with Cardizem (infusion and orals), Metoprolol or Dig  - BP was borderline which made it difficult to increase doses of medications - started heparin infusion- no signs of bleeding at surgical site on neck - 2/25- started Amiodarone infusion and he  converted to NSR soon afterwards- I feel his A-fib was stress induced due to IV steroids in setting of recent surgery - I have stopped his Heparin and will not be starting a NOAC - he can have a baby aspirin  - 2 D ECHO shows that he has only grade 1 d CHF - thyroid functions are normal - of note, high dose steroids have been noted to be associated with A-fib - he can continue on oral Cardizem for now  Multiple sclerosis - This is a new diagnosis for the patient and he was started on IV steroids for 5 days by neurology on 2/18-  -The patient will follow-up as outpatient with [facility] neurology, Dr. Felecia Shelling   Uncontrolled diabetes mellitus type 2 without complications  -Continue Lantus which has been started due to IV steroids causing hyperglycemia -has been placed on a resistant novolog sliding scale as well -Metformin is on hold - A1c is 8.4 - sugars should hopefully start improving as he has been off of IV steroids for 2 days now- I   resumed Metformin yesterday and I tried to cut back on his Lantus last night however, his sugars have  gone back up today and therefore I will be going back up on Lantus to 35 U for    Hypertension - Lisinopril and Cardizem-     Morbid obesity   Body mass index is 35.13 kg/m.  Discharge Exam: Vitals:   03/30/17 0734 03/30/17 1158  BP: 134/87 112/75  Pulse: 79 92  Resp: 15 (!) 25  Temp: 99.2 F (37.3 C) 99.1 F (37.3 C)  SpO2: 99% 98%   Vitals:   03/29/17 2331 03/30/17 0411 03/30/17 0734  03/30/17 1158  BP: (!) 124/91 108/74 134/87 112/75  Pulse: 79 71 79 92  Resp: 19 (!) 25 15 (!) 25  Temp: 98.1 F (36.7 C) 97.6 F (36.4 C) 99.2 F (37.3 C) 99.1 F (37.3 C)  TempSrc: Oral Axillary Oral Axillary  SpO2: 100% 99% 99% 98%  Weight:      Height:        General: Pt is alert, awake, not in acute distress Cardiovascular: RRR, S1/S2 +, no rubs, no gallops Respiratory: CTA bilaterally, no wheezing, no rhonchi Abdominal: Soft, NT, ND, bowel sounds + Extremities: no edema, no cyanosis   Discharge Instructions  Discharge Instructions    Diet - low sodium heart healthy   Complete by:  As directed    Increase activity slowly   Complete by:  As directed      Allergies as of 03/30/2017   No Known Allergies     Medication List    TAKE these medications   aspirin EC 81 MG tablet Take 1 tablet (81 mg total) by mouth daily.   b complex vitamins tablet Take 1 tablet by mouth daily.   diltiazem 120 MG 24 hr capsule Commonly known as:  CARDIZEM CD Take 1 capsule (120 mg total) by mouth daily.   insulin glargine 100 UNIT/ML injection Commonly known as:  LANTUS Inject 0.28 mLs (28 Units total) into the skin at bedtime.   lisinopril 10 MG tablet Commonly known as:  PRINIVIL,ZESTRIL Take 10 mg by mouth daily.   metFORMIN 500 MG tablet Commonly known as:  GLUCOPHAGE Take 500 mg by mouth 2 (two) times daily with a meal.      Follow-up Information    [facility] up on 04/13/2017.   Why:  Your appointment times is at 2 pm. Please arrive 15 min early and bring: picture ID and current medications Contact information: [place] [phone] [facility].   Why:  please use this location for your pharmacy needs.  Contact information: [place] [address] [phone]         No Known  Allergies   Procedures/Studies: 2 D ECHO Left ventricle: The cavity size was normal. There was mild   concentric hypertrophy. Systolic function was normal. The   estimated ejection fraction was in the range of 50% to 55%.   Although no diagnostic regional wall motion abnormality was   identified, this possibility cannot be completely excluded on the   basis of this study. Doppler parameters are consistent with   abnormal left ventricular relaxation (grade 1 diastolic   dysfunction). - Left atrium: The atrium was mildly dilated.  Dg Chest 2 View  Result Date: 03/21/2017 CLINICAL DATA:  Right-sided weakness. EXAM: CHEST  2 VIEW COMPARISON:  None. FINDINGS: AP and lateral views of the chest were obtained. The lungs are clear without  focal pneumonia, edema, pneumothorax or pleural effusion. Minimal atelectasis noted left base. The cardiopericardial silhouette is within normal limits for size. The visualized bony structures of the thorax are intact. Telemetry leads overlie the chest. IMPRESSION: No active cardiopulmonary disease. Electronically Signed   By: Misty Stanley M.D.   On: 03/21/2017 13:08   Dg Cervical Spine 1 View  Result Date: 03/25/2017 CLINICAL DATA:  Cervical spine fusion. EXAM: DG C-ARM 61-120 MIN; DG CERVICAL SPINE - 1 VIEW COMPARISON:  03/23/2017. FINDINGS: Lower cervical anterior and interbody fusion. Hardware intact. Anatomic alignment. Surgical sponges noted over the lower anterior neck. IMPRESSION: Lower cervical anterior interbody fusion with anatomic alignment. Electronically Signed   By: Marcello Moores  Register   On: 03/25/2017 15:47   Mr Brain W Wo Contrast  Result Date: 03/23/2017 CLINICAL DATA:  Progressive RIGHT-sided weakness for 7 months. History of hypertension, diabetes. Follow-up cervical spinal cord lesions. EXAM: MRI HEAD WITHOUT AND WITH CONTRAST TECHNIQUE: Multiplanar, multiecho pulse sequences of the brain and surrounding structures were obtained without and with  intravenous contrast. CONTRAST:  6m MULTIHANCE GADOBENATE DIMEGLUMINE 529 MG/ML IV SOLN COMPARISON:  None. FINDINGS: INTRACRANIAL CONTENTS: Faint reduced diffusion bilateral occipital lobes, extending toward splenium of corpus callosum with normalized or increased diffusivity. At least 5 subcentimeter posterior fossa lesions. 4 mm T2 bright lesion ventral spinal cord. Far greater than 10 supratentorial white matter lesions, some of which correspond to diffusion abnormality, many of which radiate from the peri ventricular margin with low T1 signal seen with black holes of demyelination. Dominant lesion LEFT frontal periventricular white matter measuring 18 mm. Additional juxta cortical and deep white matter lesions. Two 9 mm lesions LEFT thalamus extending to LEFT hypothalamus. 4 mm lesion RIGHT basal ganglia mild parenchymal brain volume loss. No hydrocephalus. Faint marginal enhancement bilateral occipital lobe lesion (coronal 8/28 and 4/28 post contrast T1). No susceptibility artifact to suggest hemorrhage. No midline shift, mass effect. No abnormal extra-axial fluid collections or extra-axial enhancement. VASCULAR: Normal major intracranial vascular flow voids present at skull base. SKULL AND UPPER CERVICAL SPINE: No abnormal sellar expansion. No suspicious calvarial bone marrow signal. Craniocervical junction maintained. SINUSES/ORBITS: The mastoid air-cells and included paranasal sinuses are well-aerated.T2 bright signal LEFT optic nerve intracanalicular segment suspected, no definite enhancement though limited assessment. OTHER: None. IMPRESSION: 1. At least 5 infratentorial and greater than 10 supratentorial lesions consistent with chronic demyelination, with acute to subacute enhancing lesions bilateral occipital lobes. Subcentimeter LEFT thalamus and RIGHT basal ganglia demyelinating plaques. 2. Probable LEFT optic neuritis. 3. Mild parenchymal brain volume loss for age. Electronically Signed   By:  CElon AlasM.D.   On: 03/23/2017 22:20   Mr Lumbar Spine Wo Contrast  Result Date: 03/21/2017 CLINICAL DATA:  Increasing leg weakness.  Urinary incontinence. EXAM: MRI LUMBAR SPINE WITHOUT CONTRAST TECHNIQUE: Multiplanar, multisequence MR imaging of the lumbar spine was performed. No intravenous contrast was administered. COMPARISON:  None. FINDINGS: Segmentation:  Assumed standard. Alignment: Mild straightening of the lumbar lordosis. Sagittal alignment is maintained. Vertebrae: Diffusely heterogeneous marrow signal throughout the lumbar spine with multifocal areas of abnormal marrow replacement. Similar appearing marrow signal abnormality is seen in the visualized sacrum, bilateral iliac bones, and bilateral lower thoracic ribs. Conus medullaris and cauda equina: Conus extends to the L1-L2 level. Patchy abnormal signal within the visualized lower thoracic cord. Clumping of the proximal cauda equina nerve roots due to downstream stenosis. Paraspinal and other soft tissues: Negative. Disc levels: T10-T11: Right lateral and anterolateral disc osteophyte complex. No stenosis.  T10-T11: Right lateral disc osteophyte complex.  No stenosis. T12-L1:  Negative. L1-L2:  Negative. L2-L3: Diffuse disc bulge with superimposed large central disc protrusion and mild left greater than right facet arthropathy resulting in severe central spinal canal stenosis. No neuroforaminal stenosis. L3-L4: Diffuse disc bulge with superimposed central and left paracentral disc protrusion. Mild bilateral facet arthropathy. Severe central spinal canal stenosis and moderate left neuroforaminal stenosis. L4-L5: Diffuse disc bulge, slightly asymmetric to the left. Moderate bilateral facet arthropathy. Severe bilateral lateral recess stenosis. Moderate central spinal canal stenosis. Mild to moderate bilateral neuroforaminal stenosis. L5-S1: Diffuse disc bulge with superimposed central disc protrusion. Mild narrowing of the bilateral lateral  recesses. Disc material abuts the descending conjoined left S1 and S2 nerve roots. Mild left and severe right neuroforaminal stenosis. IMPRESSION: 1. Diffusely heterogeneous marrow signal throughout the visualized thoracolumbar spine, pelvis, and lower thoracic ribs, concerning for multiple myeloma or metastatic disease. 2. Patchy, abnormal signal throughout the visualized lower thoracic spinal cord, nonspecific. Recommend further evaluation with MRI of the cervical and thoracic spine with and without contrast. 3. Multilevel degenerative changes throughout the lumbar spine with severe central spinal canal stenosis at L2-L3 and L3-L4, likely accounting for the patient's symptoms. 4. Severe right neuroforaminal stenosis at L5-S1. Moderate left neuroforaminal stenosis at L3-L4. Electronically Signed   By: Titus Dubin M.D.   On: 03/21/2017 16:27   Mr Cervical Spine W Or Wo Contrast  Result Date: 03/21/2017 CLINICAL DATA:  Bilateral lower extremity weakness. Urinary incontinence. Abnormal distal spinal cord on lumbar spine MRI today. EXAM: MRI CERVICAL AND THORACIC SPINE WITHOUT AND WITH CONTRAST TECHNIQUE: Multiplanar and multiecho pulse sequences of the cervical spine, to include the craniocervical junction and cervicothoracic junction, and thoracic spine, were obtained without and with intravenous contrast. CONTRAST:  37m MULTIHANCE GADOBENATE DIMEGLUMINE 529 MG/ML IV SOLN COMPARISON:  Lumbar spine MRI today FINDINGS: MRI CERVICAL SPINE FINDINGS The study is moderately motion degraded. Alignment: Cervical spine straightening.  No significant listhesis. Vertebrae: Diffuse heterogeneous replacement of normal marrow signal throughout the spine as described on prior lumbar MRI. No dominant enhancing lesion. Preserved vertebral body heights without evidence of fracture. Cord: There is abnormal T2 hyperintensity in the right lateral spinal cord at C3-4 extending over a length of approximately 1.5 cm. Smaller areas  of abnormal T2 signal are present at the superior C6 endplate level and at the C6-7 disc space level, with the latter demonstrating enhancement. Posterior Fossa, vertebral arteries, paraspinal tissues: Discrete 4 mm T2 hyperintense nonenhancing lesion in the medulla. Additional T2 hyperintense lesions in the pons and left cerebellum. Disc levels: The cervical spinal canal is small in caliber diffusely on a congenital basis. C2-3: Negative. C3-4: Shallow central disc protrusion without significant stenosis. C4-5: Mild disc bulging and uncovertebral spurring result in mild spinal stenosis and moderate bilateral neural foraminal stenosis. C5-6: Broad-based posterior disc osteophyte complex results in moderate spinal stenosis with mild cord flattening and moderate to severe bilateral neural foraminal stenosis. C6-7: Broad-based posterior disc osteophyte complex and central disc extrusion result in severe spinal stenosis with moderate cord flattening and severe bilateral neural foraminal stenosis. C7-T1: Mild uncovertebral spurring results in at most mild bilateral neural foraminal stenosis. No spinal stenosis. MRI THORACIC SPINE FINDINGS The study is mildly motion degraded. Alignment: Normal. Vertebrae: Diffuse heterogeneous replacement of normal marrow signal throughout the spine and ribs as well as visualized clavicles and sternum. No dominant enhancing lesion. Preserved vertebral body heights without evidence of fracture. Cord: As seen on today's earlier  lumbar MRI, there is patchy T2 hyperintensity in the distal thoracic spinal cord, with the most discrete lesion being on the left at T12. There is a small T2 hyperintense focus in the left central cord at T6-7. A small lesion versus artifact is questioned at T3. There is the suggestion of focal enhancement along the midline dorsal cord at T5 on axial images (series 21, image 21), however this is not confirmed on sagittal images. Paraspinal and other soft tissues:  Unremarkable. Disc levels: Endplate spurring results in mild left neural foraminal stenosis at T1-2. A right posterolateral/foraminal disc osteophyte at T2-3 results in mild right neural foraminal stenosis. A shallow left paracentral disc osteophyte complex and left facet hypertrophy at T6-7 result in mild spinal stenosis and mild left neural foraminal stenosis. A slightly larger left paracentral disc osteophyte complex at T7-8 combines with facet hypertrophy to result in mild spinal stenosis. There is at most mild disc bulging more inferiorly in the thoracic spine without significant stenosis. IMPRESSION: 1. Scattered T2 hyperintense lesions throughout the cervical and thoracic spinal cord as well as in the pons, medulla, and cerebellum. A lesion at C6-7 is enhancing. While some lesions are associated with spinal stenosis and could in isolation reflect spondylotic myelopathy, the presence of lesions elsewhere in the cord and posterior fossa are indicative of a more widespread process such as demyelinating disease. Infection, inflammatory conditions such as sarcoidosis, and metastatic disease are additional considerations. Brain MRI and CSF analysis are recommended. 2. 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 3. Mild spinal stenosis at T6-7 and T7-8 due to disc and facet degeneration. 4. Diffusely abnormal bone marrow signal. Considerations include multiple myeloma, metastatic disease, and other infiltrative/myelofibrotic marrow processes. Electronically Signed   By: Logan Bores M.D.   On: 03/21/2017 21:44   Mr Thoracic Spine W Wo Contrast  Result Date: 03/21/2017 CLINICAL DATA:  Bilateral lower extremity weakness. Urinary incontinence. Abnormal distal spinal cord on lumbar spine MRI today. EXAM: MRI CERVICAL AND THORACIC SPINE WITHOUT AND WITH CONTRAST TECHNIQUE: Multiplanar and multiecho pulse sequences of the cervical spine, to include the  craniocervical junction and cervicothoracic junction, and thoracic spine, were obtained without and with intravenous contrast. CONTRAST:  28m MULTIHANCE GADOBENATE DIMEGLUMINE 529 MG/ML IV SOLN COMPARISON:  Lumbar spine MRI today FINDINGS: MRI CERVICAL SPINE FINDINGS The study is moderately motion degraded. Alignment: Cervical spine straightening.  No significant listhesis. Vertebrae: Diffuse heterogeneous replacement of normal marrow signal throughout the spine as described on prior lumbar MRI. No dominant enhancing lesion. Preserved vertebral body heights without evidence of fracture. Cord: There is abnormal T2 hyperintensity in the right lateral spinal cord at C3-4 extending over a length of approximately 1.5 cm. Smaller areas of abnormal T2 signal are present at the superior C6 endplate level and at the C6-7 disc space level, with the latter demonstrating enhancement. Posterior Fossa, vertebral arteries, paraspinal tissues: Discrete 4 mm T2 hyperintense nonenhancing lesion in the medulla. Additional T2 hyperintense lesions in the pons and left cerebellum. Disc levels: The cervical spinal canal is small in caliber diffusely on a congenital basis. C2-3: Negative. C3-4: Shallow central disc protrusion without significant stenosis. C4-5: Mild disc bulging and uncovertebral spurring result in mild spinal stenosis and moderate bilateral neural foraminal stenosis. C5-6: Broad-based posterior disc osteophyte complex results in moderate spinal stenosis with mild cord flattening and moderate to severe bilateral neural foraminal stenosis. C6-7: Broad-based posterior disc osteophyte complex and central disc extrusion result in  severe spinal stenosis with moderate cord flattening and severe bilateral neural foraminal stenosis. C7-T1: Mild uncovertebral spurring results in at most mild bilateral neural foraminal stenosis. No spinal stenosis. MRI THORACIC SPINE FINDINGS The study is mildly motion degraded. Alignment: Normal.  Vertebrae: Diffuse heterogeneous replacement of normal marrow signal throughout the spine and ribs as well as visualized clavicles and sternum. No dominant enhancing lesion. Preserved vertebral body heights without evidence of fracture. Cord: As seen on today's earlier lumbar MRI, there is patchy T2 hyperintensity in the distal thoracic spinal cord, with the most discrete lesion being on the left at T12. There is a small T2 hyperintense focus in the left central cord at T6-7. A small lesion versus artifact is questioned at T3. There is the suggestion of focal enhancement along the midline dorsal cord at T5 on axial images (series 21, image 21), however this is not confirmed on sagittal images. Paraspinal and other soft tissues: Unremarkable. Disc levels: Endplate spurring results in mild left neural foraminal stenosis at T1-2. A right posterolateral/foraminal disc osteophyte at T2-3 results in mild right neural foraminal stenosis. A shallow left paracentral disc osteophyte complex and left facet hypertrophy at T6-7 result in mild spinal stenosis and mild left neural foraminal stenosis. A slightly larger left paracentral disc osteophyte complex at T7-8 combines with facet hypertrophy to result in mild spinal stenosis. There is at most mild disc bulging more inferiorly in the thoracic spine without significant stenosis. IMPRESSION: 1. Scattered T2 hyperintense lesions throughout the cervical and thoracic spinal cord as well as in the pons, medulla, and cerebellum. A lesion at C6-7 is enhancing. While some lesions are associated with spinal stenosis and could in isolation reflect spondylotic myelopathy, the presence of lesions elsewhere in the cord and posterior fossa are indicative of a more widespread process such as demyelinating disease. Infection, inflammatory conditions such as sarcoidosis, and metastatic disease are additional considerations. Brain MRI and CSF analysis are recommended. 2. Cervical disc  degeneration with severe spinal stenosis at C6-7 and moderate spinal stenosis at C5-6. 3. Mild spinal stenosis at T6-7 and T7-8 due to disc and facet degeneration. 4. Diffusely abnormal bone marrow signal. Considerations include multiple myeloma, metastatic disease, and other infiltrative/myelofibrotic marrow processes. Electronically Signed   By: Logan Bores M.D.   On: 03/21/2017 21:44   Dg C-arm 1-60 Min  Result Date: 03/25/2017 CLINICAL DATA:  Cervical spine fusion. EXAM: DG C-ARM 61-120 MIN; DG CERVICAL SPINE - 1 VIEW COMPARISON:  03/23/2017. FINDINGS: Lower cervical anterior and interbody fusion. Hardware intact. Anatomic alignment. Surgical sponges noted over the lower anterior neck. IMPRESSION: Lower cervical anterior interbody fusion with anatomic alignment. Electronically Signed   By: Marcello Moores  Register   On: 03/25/2017 15:47   Dg Fluoro Guide Lumbar Puncture  Result Date: 03/22/2017 CLINICAL DATA:  Lower extremity weakness. Spinal cord lesions on MRI. Severe multilevel lumbar stenosis. EXAM: DIAGNOSTIC LUMBAR PUNCTURE UNDER FLUOROSCOPIC GUIDANCE FLUOROSCOPY TIME:  Fluoroscopy Time:  0 minutes, 42 seconds Radiation Exposure Index (if provided by the fluoroscopic device): 8.9 mGy Number of Acquired Spot Images: 0 PROCEDURE: I discussed the risks (including hemorrhage, infection, headache, and nerve damage, among others), benefits, and alternatives to fluoroscopically guided lumbar puncture with the patient. We specifically discussed the high technical likelihood of success of the procedure. The patient understood and elected to undergo the procedure. Prior to the procedure I reviewed of the patient's lumbar spine MRI and determined that L5-S1 was the optimum level. Standard time-out was employed. Following sterile skin prep  and local anesthetic administration consisting of 1 percent lidocaine, a 20 gauge spinal needle was advanced without difficulty into the thecal sac at the at the L5-S1 level from a  slightly paramedian approach. Clear CSF was returned. Opening pressure was not obtained. 15 cc of clear CSF was collected. The needle was subsequently removed and the skin cleansed and bandaged. No immediate complications were observed. IMPRESSION: 1. Technically successful fluoroscopically lumbar puncture at the L5-S1 level yielding 15 cc of clear CSF. Electronically Signed   By: Van Clines M.D.   On: 03/22/2017 17:10     The results of significant diagnostics from this hospitalization (including imaging, microbiology, ancillary and laboratory) are listed below for reference.     Microbiology: Recent Results (from the past 240 hour(s))  Anaerobic culture     Status: None   Collection Time: 03/22/17  4:30 PM  Result Value Ref Range Status   Specimen Description CSF  Final   Special Requests NONE  Final   Culture   Final    NO ANAEROBES ISOLATED Performed at [facility] [facility], [address], [place], [place] [address]    Report Status 03/29/2017 FINAL  Final  CSF culture     Status: None   Collection Time: 03/22/17  4:30 PM  Result Value Ref Range Status   Specimen Description CSF  Final   Special Requests NONE  Final   Gram Stain   Final    WBC PRESENT, PREDOMINANTLY MONONUCLEAR NO ORGANISMS SEEN CYTOSPIN SMEAR    Culture   Final    NO GROWTH 3 DAYS Performed at [facility], [place] [address] [place], [place] [address]    Report Status 03/26/2017 FINAL  Final  Fungus Culture With Stain     Status: None (Preliminary result)   Collection Time: 03/22/17  4:30 PM  Result Value Ref Range Status   Fungus Stain Final report  Final    Comment: (NOTE) Performed At: [facility] [place], [place] [id number] Rush Farmer MD [id number]    Fungus (Mycology) Culture PENDING  Incomplete   Fungal Source CSF  Final  Fungus Culture Result     Status: None   Collection Time: 03/22/17  4:30 PM  Result Value Ref Range Status   Result 1 Comment   Final    Comment: (NOTE) KOH/Calcofluor preparation:  no fungus observed. Performed At: [facility] [place], [place] [id number] Rush Farmer MD [id number]   Surgical PCR screen     Status: Abnormal   Collection Time: 03/24/17 10:26 PM  Result Value Ref Range Status   MRSA, PCR NEGATIVE NEGATIVE Final   Staphylococcus aureus POSITIVE (A) NEGATIVE Final    Comment: (NOTE) The Xpert SA Assay (FDA approved for NASAL specimens in patients 73 years of age and older), is one component of a comprehensive surveillance program. It is not intended to diagnose infection nor to guide or monitor treatment. Performed at [facility], [place] [address], [place], [place] [address]      Labs: BNP (last 3 results) No results for input(s): BNP in the last 8760 hours. Basic Metabolic Panel: Recent Labs  Lab 03/25/17 0414 03/27/17 1344 03/28/17 1823 03/29/17 0357  NA 137 138 137 138  K 4.0 4.1 3.9 4.0  CL 105 97* 102 103  CO2 _0 GLUCOSE 277* 266* 167* 144*  BUN 16 24* 24* 24*  CREATININE 0.97 1.03 1.07 1.00  CALCIUM 8.8* 8.0*  8.4* 8.4*   Liver Function Tests: No results for input(s): AST, ALT, ALKPHOS, BILITOT, PROT, ALBUMIN in the last 168 hours. No results for input(s): LIPASE, AMYLASE in the last 168 hours. No results for input(s): AMMONIA in the last 168 hours. CBC: Recent Labs  Lab 03/28/17 1349 03/29/17 0357 03/30/17 0843  WBC 13.2* 14.4* 10.4  HGB 15.5 15.0 14.0  HCT 43.8 43.0 40.9  MCV 86.7 86.9 87.8  PLT 208 195 208   Cardiac Enzymes: No results for input(s): CKTOTAL, CKMB, CKMBINDEX, TROPONINI in the last 168 hours. BNP: Invalid input(s): POCBNP CBG: Recent Labs  Lab 03/29/17 1137 03/29/17 1529 03/29/17 2109 03/30/17 0734 03/30/17 1156  GLUCAP 138* 121* 162* 237* 303*   D-Dimer No results for input(s): DDIMER in the last 72 hours. Hgb A1c No results for input(s): HGBA1C in the last 72 hours. Lipid Profile No  results for input(s): CHOL, HDL, LDLCALC, TRIG, CHOLHDL, LDLDIRECT in the last 72 hours. Thyroid function studies No results for input(s): TSH, T4TOTAL, T3FREE, THYROIDAB in the last 72 hours.  Invalid input(s): FREET3 Anemia work up No results for input(s): VITAMINB12, FOLATE, FERRITIN, TIBC, IRON, RETICCTPCT in the last 72 hours. Urinalysis No results found for: COLORURINE, APPEARANCEUR, [place], [place], [place], [place], [place], [place], PROTEINUR, UROBILINOGEN, NITRITE, LEUKOCYTESUR Sepsis Labs Invalid input(s): PROCALCITONIN,  WBC,  LACTICIDVEN Microbiology Recent Results (from the past 240 hour(s))  Anaerobic culture     Status: None   Collection Time: 03/22/17  4:30 PM  Result Value Ref Range Status   Specimen Description CSF  Final   Special Requests NONE  Final   Culture   Final    NO ANAEROBES ISOLATED Performed at [facility], [address], [place], [place] [address]    Report Status 03/29/2017 FINAL  Final  CSF culture     Status: None   Collection Time: 03/22/17  4:30 PM  Result Value Ref Range Status   Specimen Description CSF  Final   Special Requests NONE  Final   Gram Stain   Final    WBC PRESENT, PREDOMINANTLY MONONUCLEAR NO ORGANISMS SEEN CYTOSPIN SMEAR    Culture   Final    NO GROWTH 3 DAYS Performed at [facility], [place] [address] [place],  [address]    Report Status 03/26/2017 FINAL  Final  Fungus Culture With Stain     Status: None (Preliminary result)   Collection Time: 03/22/17  4:30 PM  Result Value Ref Range Status   Fungus Stain Final report  Final    Comment: (NOTE) Performed At: [facility] [place], [place] [id number] Rush Farmer MD [id number]    Fungus (Mycology) Culture PENDING  Incomplete   Fungal Source CSF  Final  Fungus Culture Result     Status: None   Collection Time: 03/22/17  4:30 PM  Result Value Ref Range Status   Result 1 Comment  Final    Comment:  (NOTE) KOH/Calcofluor preparation:  no fungus observed. Performed At: [facility] [place], [place] [id number] Rush Farmer MD [id number]   Surgical PCR screen     Status: Abnormal   Collection Time: 03/24/17 10:26 PM  Result Value Ref Range Status   MRSA, PCR NEGATIVE NEGATIVE Final   Staphylococcus aureus POSITIVE (A) NEGATIVE Final    Comment: (NOTE) The Xpert SA Assay (FDA approved for NASAL specimens in patients 10 years of age and older), is one component of a comprehensive surveillance program. It is  not intended to diagnose infection nor to guide or monitor treatment. Performed at [facility], [place] [address], [place],  [address]      Time coordinating discharge: Over 30 minutes  SIGNED:   Debbe Odea, MD  [facility] 03/30/2017, 1:54 PM Pager   If 7PM-7AM, please contact night-coverage [email or web address] Password [email or web address]

## 2017-03-30 NOTE — Plan of Care (Signed)
  Progressing Clinical Measurements: Will remain free from infection 03/30/2017 0146 - Progressing by Horris Latino, RN Note No s/s of infection noted. Pain Managment: General experience of comfort will improve 03/30/2017 0146 - Progressing by Horris Latino, RN Note Pain relieved with oral pain meds, heat therapy, and frequent repositioning in bed. Skin Integrity: Risk for impaired skin integrity will decrease 03/30/2017 0146 - Progressing by Horris Latino, RN

## 2017-03-30 NOTE — Progress Notes (Addendum)
I reviewed case with Dr. Maryla Morrow as well as reviewed his progress with Physical Therapy today. Pt not yet at a level to tolerate the intensity required of an inpt rehab admission. I have notified RN CM and SW. I am not offering pt a CIR bed admission for today. I will continue to follow his progress. I will contact Dr. Butler Denmark. [phone]

## 2017-03-30 NOTE — Progress Notes (Signed)
Hematuria noted in external catheter.  Barkley Bruns, [facility] notified.  Will pause heparin gtt x 30 mins and resume at a lower rate per [facility].  Will continue to monitor.  Alonza Bogus

## 2017-03-30 NOTE — Progress Notes (Signed)
Paged [facility] about giving 60 mg Cardizem PO dose tonight. Systolic BP less than 110 but HR 90s-100s. New orders to give 30 mg Cardizem PO in place of the 60 mg tonight.

## 2017-03-30 NOTE — Clinical Social Work Note (Addendum)
Clinical Social Work Assessment  Patient Details  Name: Dylan [name] MRN: [id number] Date of Birth: January 24, 1966  Date of referral:  03/30/17               Reason for consult:  Facility Placement                Permission sought to share information with:  Facility Art therapist granted to share information::  Yes, Verbal Permission Granted  Name::        Agency::  SNFs  Relationship::     Contact Information:     Housing/Transportation Living arrangements for the past 2 months:  Apartment Source of Information:  Patient Patient Interpreter Needed:  None Criminal Activity/Legal Involvement Pertinent to Current Situation/Hospitalization:  No - Comment as needed Significant Relationships:  Siblings, Other Family Members Lives with:  Self Do you feel safe going back to the place where you live?  No Need for family participation in patient care:  No (Coment)  Care giving concerns: Patient from home independently, now requiring max assist with mobility. PT recommending CIR.  Social Worker assessment / plan: CSW met with patient at bedside. Patient alert and oriented, though lethargic. CSW discussed disposition planning. CIR unable to offer a bed to patient now, as patient unable to tolerate CIR level of therapy.   Patient lives alone in a second floor apartment and does not have assistance from family. Patient would not be able to safely return home in current condition.  Patient is agreeable to SNF. However, patient does not have any insurance coverage; financial counseling assisting. CSW consulting with CSW supervisor to determine disposition plan; 30 day LOG approved, based on Medicaid eligibility. CSW beginning SNF bed search for patient, sent out initial referrals. CSW to follow with financial counseling re: Medicaid application and will follow up with facilities for bed offers.  Employment status:  Disabled (Comment on whether or not currently receiving  Disability) Insurance information:  Self Pay (Medicaid Pending) PT Recommendations:  Inpatient Rehab Consult Information / Referral to community resources:  [place]  Patient/Family's Response to care: Patient appreciative of care.  Patient/Family's Understanding of and Emotional Response to Diagnosis, Current Treatment, and Prognosis: Patient with understanding of his conditions and need for rehab.   Emotional Assessment Appearance:  Appears stated age Attitude/Demeanor/Rapport:  Engaged, Lethargic Affect (typically observed):  Calm, Pleasant Orientation:  Oriented to Self, Oriented to Place, Oriented to  Time, Oriented to Situation Alcohol / Substance use:  Not Applicable Psych involvement (Current and /or in the community):  No (Comment)  Discharge Needs  Concerns to be addressed:  Discharge Planning Concerns, Care Coordination Readmission within the last 30 days:  No Current discharge risk:  Physical Impairment, Dependent with Mobility Barriers to Discharge:  Continued Medical Work up, No SNF bed, Inadequate or no insurance   Estanislado Emms, LCSW 03/30/2017, 2:58 PM

## 2017-03-30 NOTE — Progress Notes (Signed)
Physical Therapy Treatment Patient Details Name: Dylan [name] MRN: [id number] DOB: 1965/08/21 Today's Date: 03/30/2017    History of Present Illness Patient is a 52 y/o male who presents with 7 month hx of progressive weakness, Right>left, s/p 2 falls at home. 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 Cord compression at C5-6 spinal level. MRI are consistent with MS. s/p C5-6, 6-7 ACDF 2/21. PMH includes HTN, DM.    PT Comments    Patient received in bed, initially asleep but easily woken and willing to participate in PT this afternoon; note patient appears more lethargic than usual and reports he started feeling this way after receiving medicine from nursing earlier today. He requires heavy MaxA x2 for rolling in bed today; attempted sidelying to sit however patient TotalAx2 and needed to stop transfer attempts due to safety concerns for staff and patient given very high levels of physical assistance required today. He was repositioned in bed, bed alarm activated this afternoon, all needs otherwise met.     Follow Up Recommendations  CIR;Supervision for mobility/OOB     Equipment Recommendations  None recommended by PT    Recommendations for Other Services       Precautions / Restrictions Precautions Precautions: Fall Precaution Comments: BLE weakness; watch HR Required Braces or Orthoses: Cervical Brace Cervical Brace: Hard collar;Other (comment) Restrictions Weight Bearing Restrictions: No Other Position/Activity Restrictions: Collar can be off when in bed, but on with OOB.    Mobility  Bed Mobility Overal bed mobility: Needs Assistance Bed Mobility: Rolling Rolling: Max assist;+2 for physical assistance         General bed mobility comments: MaxA +2 today for rolling, patient  lethargic and unable to provide significant assistance; attempt sidelying to sit, however terminated transfer as this was unsafe for patient and therapy staff in his lethargic state   Transfers                 General transfer comment: deferred due to safety concerns   Ambulation/Gait             General Gait Details: deferred due to safety concerns    Stairs            Wheelchair Mobility    Modified Rankin (Stroke Patients Only)       Balance                                            Cognition Arousal/Alertness: Lethargic Behavior During Therapy: WFL for tasks assessed/performed Overall Cognitive Status: Within Functional Limits for tasks assessed                                 General Comments: patient lethargic today, reports he is especially tired after receiving medicine from nursing       Exercises      General Comments        Pertinent Vitals/Pain Pain Assessment: No/denies pain    Home Living                      Prior Function  PT Goals (current goals can now be found in the care plan section) Acute Rehab PT Goals Patient Stated Goal: to return to PLOF and be able to walk PT Goal Formulation: With patient Time For Goal Achievement: 04/06/17 Potential to Achieve Goals: Fair Progress towards PT goals: Not progressing toward goals - comment(limited by lethargy today )    Frequency    Min 3X/week      PT Plan Current plan remains appropriate    Co-evaluation              AM-PAC PT "6 Clicks" Daily Activity  Outcome Measure  Difficulty turning over in bed (including adjusting bedclothes, sheets and blankets)?: Unable Difficulty moving from lying on back to sitting on the side of the bed? : Unable Difficulty sitting down on and standing up from a chair with arms (e.g., wheelchair, bedside commode, etc,.)?: Unable Help needed moving to and from a bed to chair  (including a wheelchair)?: Total Help needed walking in hospital room?: Total Help needed climbing 3-5 steps with a railing? : Total 6 Click Score: 6    End of Session   Activity Tolerance: Patient tolerated treatment well Patient left: in bed;with call bell/phone within reach;with bed alarm set   PT Visit Diagnosis: Muscle weakness (generalized) (M62.81);Difficulty in walking, not elsewhere classified (R26.2);Unsteadiness on feet (R26.81);History of falling (Z91.81);Ataxic gait (R26.0)     Time: 1310-1333 PT Time Calculation (min) (ACUTE ONLY): 23 min  Charges:  $Therapeutic Activity: 23-37 mins                    G Codes:       Kristen Unger PT, DPT, CBIS  Supplemental Physical Therapist [place]   Pager [phone]    

## 2017-03-30 NOTE — Progress Notes (Signed)
  Echocardiogram 2D Echocardiogram has been performed.  Roosvelt Maser F 03/30/2017, 11:27 AM

## 2017-03-31 ENCOUNTER — Inpatient Hospital Stay (HOSPITAL_COMMUNITY): Payer: Medicaid Other

## 2017-03-31 ENCOUNTER — Encounter (HOSPITAL_COMMUNITY): Payer: Self-pay | Admitting: Physician Assistant

## 2017-03-31 DIAGNOSIS — G969 Disorder of central nervous system, unspecified: Secondary | ICD-10-CM

## 2017-03-31 DIAGNOSIS — I481 Persistent atrial fibrillation: Secondary | ICD-10-CM

## 2017-03-31 DIAGNOSIS — G9520 Unspecified cord compression: Secondary | ICD-10-CM

## 2017-03-31 DIAGNOSIS — M6289 Other specified disorders of muscle: Secondary | ICD-10-CM

## 2017-03-31 LAB — COMPREHENSIVE METABOLIC PANEL
ALBUMIN: 2.5 g/dL — AB (ref 3.5–5.0)
ALK PHOS: 45 U/L (ref 38–126)
ALT: 36 U/L (ref 17–63)
AST: 18 U/L (ref 15–41)
Anion gap: 12 (ref 5–15)
BILIRUBIN TOTAL: 0.7 mg/dL (ref 0.3–1.2)
BUN: 42 mg/dL — AB (ref 6–20)
CALCIUM: 8.3 mg/dL — AB (ref 8.9–10.3)
CO2: 21 mmol/L — ABNORMAL LOW (ref 22–32)
Chloride: 103 mmol/L (ref 101–111)
Creatinine, Ser: 1.47 mg/dL — ABNORMAL HIGH (ref 0.61–1.24)
GFR calc Af Amer: >60 mL/min (ref >60)
GFR, EST NON AFRICAN AMERICAN: 54 mL/min — AB (ref >60)
GLUCOSE: 261 mg/dL — AB (ref 65–99)
POTASSIUM: 4.4 mmol/L (ref 3.5–5.1)
Sodium: 136 mmol/L (ref 135–145)
TOTAL PROTEIN: 5.9 g/dL — AB (ref 6.5–8.1)

## 2017-03-31 LAB — CBC
HEMATOCRIT: 35.5 % — AB (ref 39.0–52.0)
HEMOGLOBIN: 12.4 g/dL — AB (ref 13.0–17.0)
MCH: 30.7 pg (ref 26.0–34.0)
MCHC: 34.9 g/dL (ref 30.0–36.0)
MCV: 87.9 fL (ref 78.0–100.0)
Platelets: 182 10*3/uL (ref 150–400)
RBC: 4.04 MIL/uL — ABNORMAL LOW (ref 4.22–5.81)
RDW: 13.2 % (ref 11.5–15.5)
WBC: 14.5 10*3/uL — ABNORMAL HIGH (ref 4.0–10.5)

## 2017-03-31 LAB — GLUCOSE, CAPILLARY
GLUCOSE-CAPILLARY: 272 mg/dL — AB (ref 65–99)
GLUCOSE-CAPILLARY: 267 mg/dL — AB (ref 65–99)
Glucose-Capillary: 235 mg/dL — ABNORMAL HIGH (ref 65–99)
Glucose-Capillary: 251 mg/dL — ABNORMAL HIGH (ref 65–99)

## 2017-03-31 MED ORDER — SODIUM CHLORIDE 0.9 % IV BOLUS (SEPSIS)
500.0000 mL | Freq: Once | INTRAVENOUS | Status: AC
  Administered 2017-03-31: 500 mL via INTRAVENOUS

## 2017-03-31 MED ORDER — AMIODARONE HCL IN DEXTROSE 360-4.14 MG/200ML-% IV SOLN
30.0000 mg/h | INTRAVENOUS | Status: DC
  Administered 2017-03-31 – 2017-04-04 (×9): 30 mg/h via INTRAVENOUS
  Filled 2017-03-31 (×9): qty 200

## 2017-03-31 MED ORDER — HEPARIN (PORCINE) IN NACL 100-0.45 UNIT/ML-% IJ SOLN
1000.0000 [IU]/h | INTRAMUSCULAR | Status: AC
  Administered 2017-03-31: 1000 [IU]/h via INTRAVENOUS
  Filled 2017-03-31: qty 250

## 2017-03-31 MED ORDER — AMIODARONE HCL IN DEXTROSE 360-4.14 MG/200ML-% IV SOLN
60.0000 mg/h | INTRAVENOUS | Status: AC
  Administered 2017-03-31 (×2): 60 mg/h via INTRAVENOUS
  Filled 2017-03-31 (×2): qty 200

## 2017-03-31 MED ORDER — APIXABAN 5 MG PO TABS
5.0000 mg | ORAL_TABLET | Freq: Two times a day (BID) | ORAL | Status: DC
  Administered 2017-03-31 (×2): 5 mg via ORAL
  Filled 2017-03-31 (×3): qty 1

## 2017-03-31 MED ORDER — AMIODARONE LOAD VIA INFUSION
150.0000 mg | Freq: Once | INTRAVENOUS | Status: AC
  Administered 2017-03-31: 150 mg via INTRAVENOUS
  Filled 2017-03-31: qty 83.34

## 2017-03-31 MED ORDER — DILTIAZEM HCL 60 MG PO TABS
30.0000 mg | ORAL_TABLET | Freq: Three times a day (TID) | ORAL | Status: DC
  Administered 2017-03-31 – 2017-04-03 (×9): 30 mg via ORAL
  Administered 2017-04-03: 22:00:00 via ORAL
  Administered 2017-04-04 – 2017-04-09 (×15): 30 mg via ORAL
  Filled 2017-03-31 (×26): qty 1

## 2017-03-31 MED ORDER — DILTIAZEM HCL-DEXTROSE 100-5 MG/100ML-% IV SOLN (PREMIX)
5.0000 mg/h | INTRAVENOUS | Status: DC
  Administered 2017-03-31: 5 mg/h via INTRAVENOUS
  Filled 2017-03-31: qty 100

## 2017-03-31 NOTE — Progress Notes (Addendum)
PROGRESS NOTE        PATIENT DETAILS Name: Dylan [name] Age: 52 y.o. Sex: male Date of Birth: 02-Apr-1965 Admit Date: 03/21/2017 Admitting Physician Vianne Bulls, MD BRA:[id number], No Pcp Per  Brief Narrative: Patient is a 52 y.o. male with history of DM-2, hypertension who presented with generalized weakness but mostly with progressive weakness in his right leg-patient was evaluated by neurology-and underwent workup which demonstrated cord compression at C5-C6, and C6-C7 level due to severe cervical stenosis, patient was also diagnosed with demyelinating disease (MS vs NMO).  Patient was started on IV steroids, and neurosurgery was consulted, patient underwent corpectomy at C6 with decompression on 2/21.  Further hospital course was complicated by development of atrial fibrillation with RVR.  His rate was then appropriately controlled with plans to discharge to CIR on 2/26, however patient developed lethargy and discharge was held, unfortunately on 2/27, patient was found to have A. fib with rapid response-requiring initiation of IV amiodarone, reinitiation of heparin and cardiology evaluation.  See below for further details.  Subjective: Awake alert-eating breakfast-nursing tech feeding this patient.  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    Newly diagnosed multiple sclerosis: Completed course of IV steroids, will need outpatient follow-up with [facility]-Dr. Felecia Shelling.  A. fib with RVR: In atrial fibrillation with RVR this morning-started on IV amiodarone and will be started on IV heparin.  Given recurrent nature of this issue, I have consulted cardiology.  Echocardiogram showed EF of around 50-55%.  Chads 2 Vas score is 1.  Acute kidney  injury: Likely hemodynamically mediated-we will avoid IV fluids and nephrotoxic agents for now, and just follow.  If no no improvement/or worsens-we can then contemplate further workup.   DM 2 with hyperglycemia: CBGs relatively stable-Lantus was just adjusted to 35 units nightly yesterday, continue with SSI.  Will follow and adjust accordingly.  Hypertension: Controlled, continue with lisinopril and Cardizem.   Morbid obesity  Telemetry (independently reviewed): A. fib with RVR  Echo (reviewed):EF 50-55% on TTE done on 2/26  Morning labs/Imaging ordered: yes  DVT Prophylaxis: Full dose anticoagulation with Heparin  Code Status: Full code   Family Communication: None  Disposition Plan: Remain inpatient-CIR on discharge  Antimicrobial agents: Anti-infectives (From admission, onward)   Start     Dose/Rate Route Frequency Ordered Stop   03/25/17 1900  ceFAZolin (ANCEF) IVPB 2g/100 mL premix     2 g 200 mL/hr over 30 Minutes Intravenous Every 8 hours 03/25/17 1830 03/26/17 2227   03/25/17 1404  bacitracin 50,000 Units in sodium chloride irrigation 0.9 % 500 mL irrigation  Status:  Discontinued       As needed 03/25/17 1404 03/25/17 1551   03/25/17 1200  ceFAZolin (ANCEF) 3 g in dextrose 5 % 50 mL IVPB     3 g 130 mL/hr over 30 Minutes Intravenous 30 min pre-op 03/24/17 1535 03/25/17 1325      Procedures: 2/21>> cervical spine decompression  CONSULTS:  cardiology, neurology and Neurosurgery  Time spent: 45 minutes-Greater than 50% of this time was spent in counseling, explanation of diagnosis, planning of further management, and coordination of care.  MEDICATIONS: Scheduled Meds: . B-complex with vitamin C  1 tablet Oral  Daily  . diltiazem  30 mg Oral Q8H  . insulin aspart  0-20 Units Subcutaneous TID WC  . insulin aspart  0-5 Units Subcutaneous QHS  . insulin glargine  35 Units Subcutaneous QHS  . lisinopril  10 mg Oral Daily  . pantoprazole  40 mg Oral Daily  .  sodium chloride flush  3 mL Intravenous Q12H  . sodium chloride flush  3 mL Intravenous Q12H  . sodium chloride flush  3 mL Intravenous Q12H   Continuous Infusions: . sodium chloride    . sodium chloride    . amiodarone 30 mg/hr (03/31/17 1014)  . heparin 1,000 Units/hr (03/31/17 0804)  . lactated ringers    . methocarbamol (ROBAXIN)  IV     PRN Meds:.sodium chloride, acetaminophen **OR** acetaminophen, bisacodyl, menthol-cetylpyridinium **OR** phenol, methocarbamol **OR** methocarbamol (ROBAXIN)  IV, metoprolol tartrate, ondansetron **OR** ondansetron (ZOFRAN) IV, senna-docusate, sodium chloride flush, sodium chloride flush   PHYSICAL EXAM: Vital signs: Vitals:   03/31/17 0742 03/31/17 1015 03/31/17 1120 03/31/17 1142  BP: 106/78 113/87 106/64 106/64  Pulse: (!) 118   96  Resp: (!) 23   (!) 28  Temp: 99.6 F (37.6 C)   99.9 F (37.7 C)  TempSrc: Oral   Oral  SpO2: 90%   99%  Weight:      Height:       Filed Weights   03/21/17 1713 03/22/17 0839 03/31/17 0457  Weight: (!) 158.8 kg (350 lb) 130.9 kg (288 lb 9.3 oz) (!) 136.7 kg (301 lb 5.9 oz)   Body mass index is 36.68 kg/m.   General appearance :Awake, alert, not in any distress.  Chronically sick appearing Eyes:, pupils equally reactive to light and accomodation HEENT: Atraumatic and Normocephalic Neck: supple, no JVD. No cervical lymphadenopathy. Resp:Good air entry bilaterally, clear to auscultation anteriorly CVS: S1 S2 irregular, tachycardic GI: Bowel sounds present, Non tender and not distended with no gaurding, rigidity or rebound. Extremities: B/L Lower Ext shows trace edema  Neurology:  speech clear-appears to have a left hand contracture at baseline-able to lift both upper extremities off the bed against mild resistance, continues to have significant weakness in bilateral distal lower extremities, right lower extremity appears to be weaker than the left. Musculoskeletal:No digital cyanosis Skin:No Rash, warm  and dry Wounds:N/A  I have personally reviewed following labs and imaging studies  LABORATORY DATA: CBC: Recent Labs  Lab 03/28/17 1349 03/29/17 0357 03/30/17 0843 03/31/17 0753  WBC 13.2* 14.4* 10.4 14.5*  HGB 15.5 15.0 14.0 12.4*  HCT 43.8 43.0 40.9 35.5*  MCV 86.7 86.9 87.8 87.9  PLT 208 195 208 299    Basic Metabolic Panel: Recent Labs  Lab 03/25/17 0414 03/27/17 1344 03/28/17 1823 03/29/17 0357 03/31/17 0753  NA 137 138 137 138 136  K 4.0 4.1 3.9 4.0 4.4  CL 105 97* 102 103 103  CO2 _0 21*  GLUCOSE 277* 266* 167* 144* 261*  BUN 16 24* 24* 24* 42*  CREATININE 0.97 1.03 1.07 1.00 1.47*  CALCIUM 8.8* 8.0* 8.4* 8.4* 8.3*    GFR: Estimated Creatinine Clearance: 91.8 mL/min (A) (by C-G formula based on SCr of 1.47 mg/dL (H)).  Liver Function Tests: Recent Labs  Lab 03/31/17 0753  AST 18  ALT 36  ALKPHOS 45  BILITOT 0.7  PROT 5.9*  ALBUMIN 2.5*   No results for input(s): LIPASE, AMYLASE in the last 168 hours. No results for input(s): AMMONIA in the last 168 hours.  Coagulation  Profile: Recent Labs  Lab 03/28/17 1349  INR 1.36    Cardiac Enzymes: No results for input(s): CKTOTAL, CKMB, CKMBINDEX, TROPONINI in the last 168 hours.  BNP (last 3 results) No results for input(s): PROBNP in the last 8760 hours.  HbA1C: No results for input(s): HGBA1C in the last 72 hours.  CBG: Recent Labs  Lab 03/30/17 1608 03/30/17 1617 03/30/17 2035 03/31/17 0740 03/31/17 1125  GLUCAP 262* 301* 291* 272* 267*    Lipid Profile: No results for input(s): CHOL, HDL, LDLCALC, TRIG, CHOLHDL, LDLDIRECT in the last 72 hours.  Thyroid Function Tests: No results for input(s): TSH, T4TOTAL, FREET4, T3FREE, THYROIDAB in the last 72 hours.  Anemia Panel: No results for input(s): VITAMINB12, FOLATE, FERRITIN, TIBC, IRON, RETICCTPCT in the last 72 hours.  Urine analysis: No results found for: COLORURINE, APPEARANCEUR, LABSPEC, PHURINE, GLUCOSEU, HGBUR,  BILIRUBINUR, KETONESUR, PROTEINUR, UROBILINOGEN, NITRITE, LEUKOCYTESUR  Sepsis Labs: Lactic Acid, Venous No results found for: LATICACIDVEN  MICROBIOLOGY: Recent Results (from the past 240 hour(s))  Anaerobic culture     Status: None   Collection Time: 03/22/17  4:30 PM  Result Value Ref Range Status   Specimen Description CSF  Final   Special Requests NONE  Final   Culture   Final    NO ANAEROBES ISOLATED Performed at [facility], [address], [place], [place] [address]    Report Status 03/29/2017 FINAL  Final  CSF culture     Status: None   Collection Time: 03/22/17  4:30 PM  Result Value Ref Range Status   Specimen Description CSF  Final   Special Requests NONE  Final   Gram Stain   Final    WBC PRESENT, PREDOMINANTLY MONONUCLEAR NO ORGANISMS SEEN CYTOSPIN SMEAR    Culture   Final    NO GROWTH 3 DAYS Performed at [facility], [place] [address], [place], [place] [address]    Report Status 03/26/2017 FINAL  Final  Fungus Culture With Stain     Status: None (Preliminary result)   Collection Time: 03/22/17  4:30 PM  Result Value Ref Range Status   Fungus Stain Final report  Final    Comment: (NOTE) Performed At: [facility] [place], [place] [id number] Rush Farmer MD [id number]    Fungus (Mycology) Culture PENDING  Incomplete   Fungal Source CSF  Final  Fungus Culture Result     Status: None   Collection Time: 03/22/17  4:30 PM  Result Value Ref Range Status   Result 1 Comment  Final    Comment: (NOTE) KOH/Calcofluor preparation:  no fungus observed. Performed At: [facility] [place], [place] [id number] Rush Farmer MD UX:[id number]   Surgical PCR screen     Status: Abnormal   Collection Time: 03/24/17 10:26 PM  Result Value Ref Range Status   MRSA, PCR NEGATIVE NEGATIVE Final   Staphylococcus aureus POSITIVE (A) NEGATIVE Final    Comment: (NOTE) The Xpert SA Assay (FDA approved for NASAL  specimens in patients 65 years of age and older), is one component of a comprehensive surveillance program. It is not intended to diagnose infection nor to guide or monitor treatment. Performed at [facility], [place] [address] [place], [place] [address]     RADIOLOGY STUDIES/RESULTS: Dg Chest 2 View  Result Date: 03/21/2017 CLINICAL DATA:  Right-sided weakness. EXAM: CHEST  2 VIEW COMPARISON:  None. FINDINGS: AP and lateral views of the chest were obtained. The lungs  are clear without focal pneumonia, edema, pneumothorax or pleural effusion. Minimal atelectasis noted left base. The cardiopericardial silhouette is within normal limits for size. The visualized bony structures of the thorax are intact. Telemetry leads overlie the chest. IMPRESSION: No active cardiopulmonary disease. Electronically Signed   By: Misty Stanley M.D.   On: 03/21/2017 13:08   Dg Cervical Spine 1 View  Result Date: 03/25/2017 CLINICAL DATA:  Cervical spine fusion. EXAM: DG C-ARM 61-120 MIN; DG CERVICAL SPINE - 1 VIEW COMPARISON:  03/23/2017. FINDINGS: Lower cervical anterior and interbody fusion. Hardware intact. Anatomic alignment. Surgical sponges noted over the lower anterior neck. IMPRESSION: Lower cervical anterior interbody fusion with anatomic alignment. Electronically Signed   By: Marcello Moores  Register   On: 03/25/2017 15:47   Mr Brain W Wo Contrast  Result Date: 03/23/2017 CLINICAL DATA:  Progressive RIGHT-sided weakness for 7 months. History of hypertension, diabetes. Follow-up cervical spinal cord lesions. EXAM: MRI HEAD WITHOUT AND WITH CONTRAST TECHNIQUE: Multiplanar, multiecho pulse sequences of the brain and surrounding structures were obtained without and with intravenous contrast. CONTRAST:  100m MULTIHANCE GADOBENATE DIMEGLUMINE 529 MG/ML IV SOLN COMPARISON:  None. FINDINGS: INTRACRANIAL CONTENTS: Faint reduced diffusion bilateral occipital lobes, extending toward splenium of corpus callosum with  normalized or increased diffusivity. At least 5 subcentimeter posterior fossa lesions. 4 mm T2 bright lesion ventral spinal cord. Far greater than 10 supratentorial white matter lesions, some of which correspond to diffusion abnormality, many of which radiate from the peri ventricular margin with low T1 signal seen with black holes of demyelination. Dominant lesion LEFT frontal periventricular white matter measuring 18 mm. Additional juxta cortical and deep white matter lesions. Two 9 mm lesions LEFT thalamus extending to LEFT hypothalamus. 4 mm lesion RIGHT basal ganglia mild parenchymal brain volume loss. No hydrocephalus. Faint marginal enhancement bilateral occipital lobe lesion (coronal 8/28 and 4/28 post contrast T1). No susceptibility artifact to suggest hemorrhage. No midline shift, mass effect. No abnormal extra-axial fluid collections or extra-axial enhancement. VASCULAR: Normal major intracranial vascular flow voids present at skull base. SKULL AND UPPER CERVICAL SPINE: No abnormal sellar expansion. No suspicious calvarial bone marrow signal. Craniocervical junction maintained. SINUSES/ORBITS: The mastoid air-cells and included paranasal sinuses are well-aerated.T2 bright signal LEFT optic nerve intracanalicular segment suspected, no definite enhancement though limited assessment. OTHER: None. IMPRESSION: 1. At least 5 infratentorial and greater than 10 supratentorial lesions consistent with chronic demyelination, with acute to subacute enhancing lesions bilateral occipital lobes. Subcentimeter LEFT thalamus and RIGHT basal ganglia demyelinating plaques. 2. Probable LEFT optic neuritis. 3. Mild parenchymal brain volume loss for age. Electronically Signed   By: CElon AlasM.D.   On: 03/23/2017 22:20   Mr Lumbar Spine Wo Contrast  Result Date: 03/21/2017 CLINICAL DATA:  Increasing leg weakness.  Urinary incontinence. EXAM: MRI LUMBAR SPINE WITHOUT CONTRAST TECHNIQUE: Multiplanar, multisequence MR  imaging of the lumbar spine was performed. No intravenous contrast was administered. COMPARISON:  None. FINDINGS: Segmentation:  Assumed standard. Alignment: Mild straightening of the lumbar lordosis. Sagittal alignment is maintained. Vertebrae: Diffusely heterogeneous marrow signal throughout the lumbar spine with multifocal areas of abnormal marrow replacement. Similar appearing marrow signal abnormality is seen in the visualized sacrum, bilateral iliac bones, and bilateral lower thoracic ribs. Conus medullaris and cauda equina: Conus extends to the L1-L2 level. Patchy abnormal signal within the visualized lower thoracic cord. Clumping of the proximal cauda equina nerve roots due to downstream stenosis. Paraspinal and other soft tissues: Negative. Disc levels: T10-T11: Right lateral and anterolateral disc osteophyte  complex. No stenosis. T10-T11: Right lateral disc osteophyte complex.  No stenosis. T12-L1:  Negative. L1-L2:  Negative. L2-L3: Diffuse disc bulge with superimposed large central disc protrusion and mild left greater than right facet arthropathy resulting in severe central spinal canal stenosis. No neuroforaminal stenosis. L3-L4: Diffuse disc bulge with superimposed central and left paracentral disc protrusion. Mild bilateral facet arthropathy. Severe central spinal canal stenosis and moderate left neuroforaminal stenosis. L4-L5: Diffuse disc bulge, slightly asymmetric to the left. Moderate bilateral facet arthropathy. Severe bilateral lateral recess stenosis. Moderate central spinal canal stenosis. Mild to moderate bilateral neuroforaminal stenosis. L5-S1: Diffuse disc bulge with superimposed central disc protrusion. Mild narrowing of the bilateral lateral recesses. Disc material abuts the descending conjoined left S1 and S2 nerve roots. Mild left and severe right neuroforaminal stenosis. IMPRESSION: 1. Diffusely heterogeneous marrow signal throughout the visualized thoracolumbar spine, pelvis, and  lower thoracic ribs, concerning for multiple myeloma or metastatic disease. 2. Patchy, abnormal signal throughout the visualized lower thoracic spinal cord, nonspecific. Recommend further evaluation with MRI of the cervical and thoracic spine with and without contrast. 3. Multilevel degenerative changes throughout the lumbar spine with severe central spinal canal stenosis at L2-L3 and L3-L4, likely accounting for the patient's symptoms. 4. Severe right neuroforaminal stenosis at L5-S1. Moderate left neuroforaminal stenosis at L3-L4. Electronically Signed   By: Titus Dubin M.D.   On: 03/21/2017 16:27   Mr Cervical Spine W Or Wo Contrast  Result Date: 03/21/2017 CLINICAL DATA:  Bilateral lower extremity weakness. Urinary incontinence. Abnormal distal spinal cord on lumbar spine MRI today. EXAM: MRI CERVICAL AND THORACIC SPINE WITHOUT AND WITH CONTRAST TECHNIQUE: Multiplanar and multiecho pulse sequences of the cervical spine, to include the craniocervical junction and cervicothoracic junction, and thoracic spine, were obtained without and with intravenous contrast. CONTRAST:  103m MULTIHANCE GADOBENATE DIMEGLUMINE 529 MG/ML IV SOLN COMPARISON:  Lumbar spine MRI today FINDINGS: MRI CERVICAL SPINE FINDINGS The study is moderately motion degraded. Alignment: Cervical spine straightening.  No significant listhesis. Vertebrae: Diffuse heterogeneous replacement of normal marrow signal throughout the spine as described on prior lumbar MRI. No dominant enhancing lesion. Preserved vertebral body heights without evidence of fracture. Cord: There is abnormal T2 hyperintensity in the right lateral spinal cord at C3-4 extending over a length of approximately 1.5 cm. Smaller areas of abnormal T2 signal are present at the superior C6 endplate level and at the C6-7 disc space level, with the latter demonstrating enhancement. Posterior Fossa, vertebral arteries, paraspinal tissues: Discrete 4 mm T2 hyperintense nonenhancing  lesion in the medulla. Additional T2 hyperintense lesions in the pons and left cerebellum. Disc levels: The cervical spinal canal is small in caliber diffusely on a congenital basis. C2-3: Negative. C3-4: Shallow central disc protrusion without significant stenosis. C4-5: Mild disc bulging and uncovertebral spurring result in mild spinal stenosis and moderate bilateral neural foraminal stenosis. C5-6: Broad-based posterior disc osteophyte complex results in moderate spinal stenosis with mild cord flattening and moderate to severe bilateral neural foraminal stenosis. C6-7: Broad-based posterior disc osteophyte complex and central disc extrusion result in severe spinal stenosis with moderate cord flattening and severe bilateral neural foraminal stenosis. C7-T1: Mild uncovertebral spurring results in at most mild bilateral neural foraminal stenosis. No spinal stenosis. MRI THORACIC SPINE FINDINGS The study is mildly motion degraded. Alignment: Normal. Vertebrae: Diffuse heterogeneous replacement of normal marrow signal throughout the spine and ribs as well as visualized clavicles and sternum. No dominant enhancing lesion. Preserved vertebral body heights without evidence of fracture. Cord: As seen  on today's earlier lumbar MRI, there is patchy T2 hyperintensity in the distal thoracic spinal cord, with the most discrete lesion being on the left at T12. There is a small T2 hyperintense focus in the left central cord at T6-7. A small lesion versus artifact is questioned at T3. There is the suggestion of focal enhancement along the midline dorsal cord at T5 on axial images (series 21, image 21), however this is not confirmed on sagittal images. Paraspinal and other soft tissues: Unremarkable. Disc levels: Endplate spurring results in mild left neural foraminal stenosis at T1-2. A right posterolateral/foraminal disc osteophyte at T2-3 results in mild right neural foraminal stenosis. A shallow left paracentral disc  osteophyte complex and left facet hypertrophy at T6-7 result in mild spinal stenosis and mild left neural foraminal stenosis. A slightly larger left paracentral disc osteophyte complex at T7-8 combines with facet hypertrophy to result in mild spinal stenosis. There is at most mild disc bulging more inferiorly in the thoracic spine without significant stenosis. IMPRESSION: 1. Scattered T2 hyperintense lesions throughout the cervical and thoracic spinal cord as well as in the pons, medulla, and cerebellum. A lesion at C6-7 is enhancing. While some lesions are associated with spinal stenosis and could in isolation reflect spondylotic myelopathy, the presence of lesions elsewhere in the cord and posterior fossa are indicative of a more widespread process such as demyelinating disease. Infection, inflammatory conditions such as sarcoidosis, and metastatic disease are additional considerations. Brain MRI and CSF analysis are recommended. 2. 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 3. Mild spinal stenosis at T6-7 and T7-8 due to disc and facet degeneration. 4. Diffusely abnormal bone marrow signal. Considerations include multiple myeloma, metastatic disease, and other infiltrative/myelofibrotic marrow processes. Electronically Signed   By: Logan Bores M.D.   On: 03/21/2017 21:44   Mr Thoracic Spine W Wo Contrast  Result Date: 03/21/2017 CLINICAL DATA:  Bilateral lower extremity weakness. Urinary incontinence. Abnormal distal spinal cord on lumbar spine MRI today. EXAM: MRI CERVICAL AND THORACIC SPINE WITHOUT AND WITH CONTRAST TECHNIQUE: Multiplanar and multiecho pulse sequences of the cervical spine, to include the craniocervical junction and cervicothoracic junction, and thoracic spine, were obtained without and with intravenous contrast. CONTRAST:  74m MULTIHANCE GADOBENATE DIMEGLUMINE 529 MG/ML IV SOLN COMPARISON:  Lumbar spine MRI today FINDINGS: MRI  CERVICAL SPINE FINDINGS The study is moderately motion degraded. Alignment: Cervical spine straightening.  No significant listhesis. Vertebrae: Diffuse heterogeneous replacement of normal marrow signal throughout the spine as described on prior lumbar MRI. No dominant enhancing lesion. Preserved vertebral body heights without evidence of fracture. Cord: There is abnormal T2 hyperintensity in the right lateral spinal cord at C3-4 extending over a length of approximately 1.5 cm. Smaller areas of abnormal T2 signal are present at the superior C6 endplate level and at the C6-7 disc space level, with the latter demonstrating enhancement. Posterior Fossa, vertebral arteries, paraspinal tissues: Discrete 4 mm T2 hyperintense nonenhancing lesion in the medulla. Additional T2 hyperintense lesions in the pons and left cerebellum. Disc levels: The cervical spinal canal is small in caliber diffusely on a congenital basis. C2-3: Negative. C3-4: Shallow central disc protrusion without significant stenosis. C4-5: Mild disc bulging and uncovertebral spurring result in mild spinal stenosis and moderate bilateral neural foraminal stenosis. C5-6: Broad-based posterior disc osteophyte complex results in moderate spinal stenosis with mild cord flattening and moderate to severe bilateral neural foraminal stenosis. C6-7: Broad-based posterior disc osteophyte complex and central disc  extrusion result in severe spinal stenosis with moderate cord flattening and severe bilateral neural foraminal stenosis. C7-T1: Mild uncovertebral spurring results in at most mild bilateral neural foraminal stenosis. No spinal stenosis. MRI THORACIC SPINE FINDINGS The study is mildly motion degraded. Alignment: Normal. Vertebrae: Diffuse heterogeneous replacement of normal marrow signal throughout the spine and ribs as well as visualized clavicles and sternum. No dominant enhancing lesion. Preserved vertebral body heights without evidence of fracture. Cord: As  seen on today's earlier lumbar MRI, there is patchy T2 hyperintensity in the distal thoracic spinal cord, with the most discrete lesion being on the left at T12. There is a small T2 hyperintense focus in the left central cord at T6-7. A small lesion versus artifact is questioned at T3. There is the suggestion of focal enhancement along the midline dorsal cord at T5 on axial images (series 21, image 21), however this is not confirmed on sagittal images. Paraspinal and other soft tissues: Unremarkable. Disc levels: Endplate spurring results in mild left neural foraminal stenosis at T1-2. A right posterolateral/foraminal disc osteophyte at T2-3 results in mild right neural foraminal stenosis. A shallow left paracentral disc osteophyte complex and left facet hypertrophy at T6-7 result in mild spinal stenosis and mild left neural foraminal stenosis. A slightly larger left paracentral disc osteophyte complex at T7-8 combines with facet hypertrophy to result in mild spinal stenosis. There is at most mild disc bulging more inferiorly in the thoracic spine without significant stenosis. IMPRESSION: 1. Scattered T2 hyperintense lesions throughout the cervical and thoracic spinal cord as well as in the pons, medulla, and cerebellum. A lesion at C6-7 is enhancing. While some lesions are associated with spinal stenosis and could in isolation reflect spondylotic myelopathy, the presence of lesions elsewhere in the cord and posterior fossa are indicative of a more widespread process such as demyelinating disease. Infection, inflammatory conditions such as sarcoidosis, and metastatic disease are additional considerations. Brain MRI and CSF analysis are recommended. 2. 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 3. Mild spinal stenosis at T6-7 and T7-8 due to disc and facet degeneration. 4. Diffusely abnormal bone marrow signal. Considerations include multiple myeloma,  metastatic disease, and other infiltrative/myelofibrotic marrow processes. Electronically Signed   By: Logan Bores M.D.   On: 03/21/2017 21:44   Dg C-arm 1-60 Min  Result Date: 03/25/2017 CLINICAL DATA:  Cervical spine fusion. EXAM: DG C-ARM 61-120 MIN; DG CERVICAL SPINE - 1 VIEW COMPARISON:  03/23/2017. FINDINGS: Lower cervical anterior and interbody fusion. Hardware intact. Anatomic alignment. Surgical sponges noted over the lower anterior neck. IMPRESSION: Lower cervical anterior interbody fusion with anatomic alignment. Electronically Signed   By: Marcello Moores  Register   On: 03/25/2017 15:47   Dg Fluoro Guide Lumbar Puncture  Result Date: 03/22/2017 CLINICAL DATA:  Lower extremity weakness. Spinal cord lesions on MRI. Severe multilevel lumbar stenosis. EXAM: DIAGNOSTIC LUMBAR PUNCTURE UNDER FLUOROSCOPIC GUIDANCE FLUOROSCOPY TIME:  Fluoroscopy Time:  0 minutes, 42 seconds Radiation Exposure Index (if provided by the fluoroscopic device): 8.9 mGy Number of Acquired Spot Images: 0 PROCEDURE: I discussed the risks (including hemorrhage, infection, headache, and nerve damage, among others), benefits, and alternatives to fluoroscopically guided lumbar puncture with the patient. We specifically discussed the high technical likelihood of success of the procedure. The patient understood and elected to undergo the procedure. Prior to the procedure I reviewed of the patient's lumbar spine MRI and determined that L5-S1 was the optimum level. Standard time-out was employed. Following  sterile skin prep and local anesthetic administration consisting of 1 percent lidocaine, a 20 gauge spinal needle was advanced without difficulty into the thecal sac at the at the L5-S1 level from a slightly paramedian approach. Clear CSF was returned. Opening pressure was not obtained. 15 cc of clear CSF was collected. The needle was subsequently removed and the skin cleansed and bandaged. No immediate complications were observed.  IMPRESSION: 1. Technically successful fluoroscopically lumbar puncture at the L5-S1 level yielding 15 cc of clear CSF. Electronically Signed   By: Van Clines M.D.   On: 03/22/2017 17:10     LOS: 10 days   Oren Binet, MD  [facility] Pager:[phone]  If 7PM-7AM, please contact night-coverage [email or web address] Password [facility] 03/31/2017, 11:48 AM

## 2017-03-31 NOTE — Consult Note (Addendum)
Cardiology Consultation:   Patient ID: Dylan [name]; [id number]; 1965/04/22   Admit date: 03/21/2017 Date of Consult: 03/31/2017  Primary Care Provider: Patient, No Pcp Per Primary Cardiologist: New, will be Dr Debara Pickett Primary Electrophysiologist:  n/a   Patient Profile:   Dylan [name] is a 52 y.o. male with a hx of DM, HTN, back pain/sciatica, who is being seen today for the evaluation of rapid atrial fib at the request of Dr Sloan Leiter.  History of Present Illness:   Dylan. [name] was admitted 02/17 with weakness, generalized, but worse in the RLE. Dx w/ spinal stenosis and possibly MS. He had Corpectomy for decompression at C6, placement of an intervertebral biomechanical device, and arthrodesis at C5-C7. Steroids started for the MS.    On 02/23, HR noted to be 141. ECG w/ rapid atrial fib, occ PVCs. In out of [place] since then, in [place] most of 02/26, but back to afib w/ HR 140s early this am. HR has been elevated since then.   He was given digoxin injections and IV Cardizem on 02/24. Then started on Cardizem 60 mg q 8 hr. However, he never got more than 2 doses/day because of low BP. IV amio started today. Cards asked to see.   Dylan [name] is sleepy, but answers appropriately. He has not had pain med or Ativan in > 24 hr.   Dylan [name] denies any awareness of the atrial fib. He has had no SOB, no CP, no palpitations, no presyncope or syncope. If he had not been told, he would never have known it. Therefore, cannot say if this has ever happened before. He was not checking his BP at home, so never knew a HR.   Past Medical History:  Diagnosis Date  . 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 Diabetes mellitus without complication ([place])   . Hypertension   . Persistent atrial fibrillation with rapid ventricular response ([place]) 03/27/2017    Past Surgical History:  Procedure Laterality Date  . ANTERIOR CERVICAL DECOMP/DISCECTOMY FUSION N/A 03/25/2017   Procedure: ANTERIOR CERVICAL DECOMPRESSION/DISCECTOMY  FUSION CERVICAL FIVE- CERVICAL SIX, CERVICAL SIX- CERVICAL SEVEN, CORPECTOMY;  Surgeon: Consuella Lose, MD;  Location: [facility];  Service: Neurosurgery;  Laterality: N/A;  . NOSE SURGERY     AS A CHILD     Prior to Admission medications   Medication Sig Start Date End Date Taking? Authorizing Provider  b complex vitamins tablet Take 1 tablet by mouth daily.   Yes [provider]  lisinopril (PRINIVIL,ZESTRIL) 10 MG tablet Take 10 mg by mouth daily.   Yes [provider]  metFORMIN (GLUCOPHAGE) 500 MG tablet Take 500 mg by mouth 2 (two) times daily with a meal.   Yes [provider]  aspirin EC 81 MG tablet Take 1 tablet (81 mg total) by mouth daily. 03/30/17 03/30/18  Debbe Odea, MD  diltiazem (CARDIZEM CD) 120 MG 24 hr capsule Take 1 capsule (120 mg total) by mouth daily. 03/30/17 03/30/18  Debbe Odea, MD  insulin glargine (LANTUS) 100 UNIT/ML injection Inject 0.28 mLs (28 Units total) into the skin at bedtime. 03/30/17   Debbe Odea, MD    Inpatient Medications: Scheduled Meds: . B-complex with vitamin C  1 tablet Oral Daily  . diltiazem  60 mg Oral Q8H  . insulin aspart  0-20 Units Subcutaneous TID WC  . insulin aspart  0-5 Units Subcutaneous QHS  . insulin glargine  35 Units Subcutaneous QHS  . lisinopril  10 mg Oral Daily  .  pantoprazole  40 mg Oral Daily  . sodium chloride flush  3 mL Intravenous Q12H  . sodium chloride flush  3 mL Intravenous Q12H  . sodium chloride flush  3 mL Intravenous Q12H   Continuous Infusions: . sodium chloride    . sodium chloride    . amiodarone 30 mg/hr (03/31/17 1014)  . heparin 1,000 Units/hr (03/31/17 0804)  . lactated ringers    . methocarbamol (ROBAXIN)  IV     PRN Meds: sodium chloride, acetaminophen **OR** acetaminophen, bisacodyl, menthol-cetylpyridinium **OR** phenol, methocarbamol **OR** methocarbamol (ROBAXIN)  IV, metoprolol tartrate, ondansetron **OR** ondansetron (ZOFRAN) IV, senna-docusate, sodium  chloride flush, sodium chloride flush  Allergies:   No Known Allergies  Social History:   Social History   Socioeconomic History  . Marital status: Single    Spouse name: Not on file  . Number of children: Not on file  . Years of education: Not on file  . Highest education level: Not on file  Social Needs  . Financial resource strain: Not on file  . Food insecurity - worry: Not on file  . Food insecurity - inability: Not on file  . Transportation needs - medical: Not on file  . Transportation needs - non-medical: Not on file  Occupational History  . Not on file  Tobacco Use  . Smoking status: Never Smoker  . Smokeless tobacco: Never Used  Substance and Sexual Activity  . Alcohol use: No    Frequency: Never  . Drug use: No  . Sexual activity: Not on file  Other Topics Concern  . Not on file  Social History Narrative  . Not on file    Family History:   Family History  Problem Relation Age of Onset  . Prostate cancer Father    Family Status:  Family Status  Relation Name Status  . Father  (Not Specified)    ROS:  Please see the history of present illness.  All other ROS reviewed and negative.     Physical Exam/Data:   Vitals:   03/31/17 0425 03/31/17 0457 03/31/17 0742 03/31/17 1015  BP: 108/71 100/74 106/78 113/87  Pulse: 79  (!) 118   Resp: (!) 26  (!) 23   Temp:  99.5 F (37.5 C) 99.6 F (37.6 C)   TempSrc:  Oral Oral   SpO2: 100%  90%   Weight:  (!) 301 lb 5.9 oz (136.7 kg)    Height:        Intake/Output Summary (Last 24 hours) at 03/31/2017 1020 Last data filed at 03/31/2017 0939 Gross per 24 hour  Intake 1485.74 ml  Output 1850 ml  Net -364.26 ml   Filed Weights   03/21/17 1713 03/22/17 0839 03/31/17 0457  Weight: (!) 350 lb (158.8 kg) 288 lb 9.3 oz (130.9 kg) (!) 301 lb 5.9 oz (136.7 kg)   Body mass index is 36.68 kg/m.  General:  Well nourished, well developed, somnolent, obese male, NAD HEENT: normal Lymph: no adenopathy Neck: no  JVD  Endocrine:  No thryomegaly Vascular: No carotid bruits; 4/4 extremity pulses 2+, without bruits  Cardiac:  normal S1, S2; Irreg R&R; no murmur  Lungs:  clear to auscultation bilaterally, no wheezing, rhonchi or rales  Abd: soft, nontender, no hepatomegaly  Ext: no edema Musculoskeletal:  No deformities, BUE and BLE strength normal and equal Skin: warm and dry  Neuro:  CNs 2-12 intact, no focal abnormalities noted Psych:  Normal affect   EKG:  The EKG was personally reviewed  and demonstrates:  Atrial fib, RVR, HR 128 Telemetry:  Telemetry was personally reviewed and demonstrates:  Atrial fib, HR 110s-130s  Relevant CV Studies:  ECHO: 03/30/2017 - Left ventricle: The cavity size was normal. There was mild   concentric hypertrophy. Systolic function was normal. The   estimated ejection fraction was in the range of 50% to 55%.   Although no diagnostic regional wall motion abnormality was   identified, this possibility cannot be completely excluded on the   basis of this study. Doppler parameters are consistent with   abnormal left ventricular relaxation (grade 1 diastolic   dysfunction). - Left atrium: The atrium was mildly dilated.  Laboratory Data:  Chemistry Recent Labs  Lab 03/28/17 1823 03/29/17 0357 03/31/17 0753  NA 137 138 136  K 3.9 4.0 4.4  CL 102 103 103  CO2 25 25 21*  GLUCOSE 167* 144* 261*  BUN 24* 24* 42*  CREATININE 1.07 1.00 1.47*  CALCIUM 8.4* 8.4* 8.3*  GFRNONAA >60 >60 54*  GFRAA >60 >60 >60  ANIONGAP _0 Lab Results  Component Value Date   ALT 36 03/31/2017   AST 18 03/31/2017   ALKPHOS 45 03/31/2017   BILITOT 0.7 03/31/2017   Hematology Recent Labs  Lab 03/28/17 1349 03/29/17 0357 03/30/17 0843  WBC 13.2* 14.4* 10.4  RBC 5.05 4.95 4.66  HGB 15.5 15.0 14.0  HCT 43.8 43.0 40.9  MCV 86.7 86.9 87.8  MCH 30.7 30.3 30.0  MCHC 35.4 34.9 34.2  RDW 13.1 13.0 13.1  PLT 208 195 208   TSH:  Lab Results  Component Value Date     TSH 0.725 03/27/2017   HgbA1c: Lab Results  Component Value Date   HGBA1C 8.4 (H) 03/22/2017   Magnesium: No results found for: MG   Radiology/Studies:  Dg Chest 2 View  Result Date: 03/21/2017 CLINICAL DATA:  Right-sided weakness. EXAM: CHEST  2 VIEW COMPARISON:  None. FINDINGS: AP and lateral views of the chest were obtained. The lungs are clear without focal pneumonia, edema, pneumothorax or pleural effusion. Minimal atelectasis noted left base. The cardiopericardial silhouette is within normal limits for size. The visualized bony structures of the thorax are intact. Telemetry leads overlie the chest. IMPRESSION: No active cardiopulmonary disease. Electronically Signed   By: Misty Stanley M.D.   On: 03/21/2017 13:08   Dg Cervical Spine 1 View  Result Date: 03/25/2017 CLINICAL DATA:  Cervical spine fusion. EXAM: DG C-ARM 61-120 MIN; DG CERVICAL SPINE - 1 VIEW COMPARISON:  03/23/2017. FINDINGS: Lower cervical anterior and interbody fusion. Hardware intact. Anatomic alignment. Surgical sponges noted over the lower anterior neck. IMPRESSION: Lower cervical anterior interbody fusion with anatomic alignment. Electronically Signed   By: Marcello Moores  Register   On: 03/25/2017 15:47   Dylan Brain W Wo Contrast  Result Date: 03/23/2017 CLINICAL DATA:  Progressive RIGHT-sided weakness for 7 months. History of hypertension, diabetes. Follow-up cervical spinal cord lesions. EXAM: MRI HEAD WITHOUT AND WITH CONTRAST TECHNIQUE: Multiplanar, multiecho pulse sequences of the brain and surrounding structures were obtained without and with intravenous contrast. CONTRAST:  51m MULTIHANCE GADOBENATE DIMEGLUMINE 529 MG/ML IV SOLN COMPARISON:  None. FINDINGS: INTRACRANIAL CONTENTS: Faint reduced diffusion bilateral occipital lobes, extending toward splenium of corpus callosum with normalized or increased diffusivity. At least 5 subcentimeter posterior fossa lesions. 4 mm T2 bright lesion ventral spinal cord. Far greater  than 10 supratentorial white matter lesions, some of which correspond to diffusion abnormality, many of which radiate from the  peri ventricular margin with low T1 signal seen with black holes of demyelination. Dominant lesion LEFT frontal periventricular white matter measuring 18 mm. Additional juxta cortical and deep white matter lesions. Two 9 mm lesions LEFT thalamus extending to LEFT hypothalamus. 4 mm lesion RIGHT basal ganglia mild parenchymal brain volume loss. No hydrocephalus. Faint marginal enhancement bilateral occipital lobe lesion (coronal 8/28 and 4/28 post contrast T1). No susceptibility artifact to suggest hemorrhage. No midline shift, mass effect. No abnormal extra-axial fluid collections or extra-axial enhancement. VASCULAR: Normal major intracranial vascular flow voids present at skull base. SKULL AND UPPER CERVICAL SPINE: No abnormal sellar expansion. No suspicious calvarial bone marrow signal. Craniocervical junction maintained. SINUSES/ORBITS: The mastoid air-cells and included paranasal sinuses are well-aerated.T2 bright signal LEFT optic nerve intracanalicular segment suspected, no definite enhancement though limited assessment. OTHER: None. IMPRESSION: 1. At least 5 infratentorial and greater than 10 supratentorial lesions consistent with chronic demyelination, with acute to subacute enhancing lesions bilateral occipital lobes. Subcentimeter LEFT thalamus and RIGHT basal ganglia demyelinating plaques. 2. Probable LEFT optic neuritis. 3. Mild parenchymal brain volume loss for age. Electronically Signed   By: Elon Alas M.D.   On: 03/23/2017 22:20   Dylan Lumbar Spine Wo Contrast  Result Date: 03/21/2017 CLINICAL DATA:  Increasing leg weakness.  Urinary incontinence. EXAM: MRI LUMBAR SPINE WITHOUT CONTRAST TECHNIQUE: Multiplanar, multisequence Dylan imaging of the lumbar spine was performed. No intravenous contrast was administered. COMPARISON:  None. FINDINGS: Segmentation:  Assumed  standard. Alignment: Mild straightening of the lumbar lordosis. Sagittal alignment is maintained. Vertebrae: Diffusely heterogeneous marrow signal throughout the lumbar spine with multifocal areas of abnormal marrow replacement. Similar appearing marrow signal abnormality is seen in the visualized sacrum, bilateral iliac bones, and bilateral lower thoracic ribs. Conus medullaris and cauda equina: Conus extends to the L1-L2 level. Patchy abnormal signal within the visualized lower thoracic cord. Clumping of the proximal cauda equina nerve roots due to downstream stenosis. Paraspinal and other soft tissues: Negative. Disc levels: T10-T11: Right lateral and anterolateral disc osteophyte complex. No stenosis. T10-T11: Right lateral disc osteophyte complex.  No stenosis. T12-L1:  Negative. L1-L2:  Negative. L2-L3: Diffuse disc bulge with superimposed large central disc protrusion and mild left greater than right facet arthropathy resulting in severe central spinal canal stenosis. No neuroforaminal stenosis. L3-L4: Diffuse disc bulge with superimposed central and left paracentral disc protrusion. Mild bilateral facet arthropathy. Severe central spinal canal stenosis and moderate left neuroforaminal stenosis. L4-L5: Diffuse disc bulge, slightly asymmetric to the left. Moderate bilateral facet arthropathy. Severe bilateral lateral recess stenosis. Moderate central spinal canal stenosis. Mild to moderate bilateral neuroforaminal stenosis. L5-S1: Diffuse disc bulge with superimposed central disc protrusion. Mild narrowing of the bilateral lateral recesses. Disc material abuts the descending conjoined left S1 and S2 nerve roots. Mild left and severe right neuroforaminal stenosis. IMPRESSION: 1. Diffusely heterogeneous marrow signal throughout the visualized thoracolumbar spine, pelvis, and lower thoracic ribs, concerning for multiple myeloma or metastatic disease. 2. Patchy, abnormal signal throughout the visualized lower  thoracic spinal cord, nonspecific. Recommend further evaluation with MRI of the cervical and thoracic spine with and without contrast. 3. Multilevel degenerative changes throughout the lumbar spine with severe central spinal canal stenosis at L2-L3 and L3-L4, likely accounting for the patient's symptoms. 4. Severe right neuroforaminal stenosis at L5-S1. Moderate left neuroforaminal stenosis at L3-L4. Electronically Signed   By: Titus Dubin M.D.   On: 03/21/2017 16:27   Dylan Cervical Spine W Or Wo Contrast  Result Date: 03/21/2017 CLINICAL DATA:  Bilateral lower extremity weakness. Urinary incontinence. Abnormal distal spinal cord on lumbar spine MRI today. EXAM: MRI CERVICAL AND THORACIC SPINE WITHOUT AND WITH CONTRAST TECHNIQUE: Multiplanar and multiecho pulse sequences of the cervical spine, to include the craniocervical junction and cervicothoracic junction, and thoracic spine, were obtained without and with intravenous contrast. CONTRAST:  28m MULTIHANCE GADOBENATE DIMEGLUMINE 529 MG/ML IV SOLN COMPARISON:  Lumbar spine MRI today FINDINGS: MRI CERVICAL SPINE FINDINGS The study is moderately motion degraded. Alignment: Cervical spine straightening.  No significant listhesis. Vertebrae: Diffuse heterogeneous replacement of normal marrow signal throughout the spine as described on prior lumbar MRI. No dominant enhancing lesion. Preserved vertebral body heights without evidence of fracture. Cord: There is abnormal T2 hyperintensity in the right lateral spinal cord at C3-4 extending over a length of approximately 1.5 cm. Smaller areas of abnormal T2 signal are present at the superior C6 endplate level and at the C6-7 disc space level, with the latter demonstrating enhancement. Posterior Fossa, vertebral arteries, paraspinal tissues: Discrete 4 mm T2 hyperintense nonenhancing lesion in the medulla. Additional T2 hyperintense lesions in the pons and left cerebellum. Disc levels: The cervical spinal canal is  small in caliber diffusely on a congenital basis. C2-3: Negative. C3-4: Shallow central disc protrusion without significant stenosis. C4-5: Mild disc bulging and uncovertebral spurring result in mild spinal stenosis and moderate bilateral neural foraminal stenosis. C5-6: Broad-based posterior disc osteophyte complex results in moderate spinal stenosis with mild cord flattening and moderate to severe bilateral neural foraminal stenosis. C6-7: Broad-based posterior disc osteophyte complex and central disc extrusion result in severe spinal stenosis with moderate cord flattening and severe bilateral neural foraminal stenosis. C7-T1: Mild uncovertebral spurring results in at most mild bilateral neural foraminal stenosis. No spinal stenosis. MRI THORACIC SPINE FINDINGS The study is mildly motion degraded. Alignment: Normal. Vertebrae: Diffuse heterogeneous replacement of normal marrow signal throughout the spine and ribs as well as visualized clavicles and sternum. No dominant enhancing lesion. Preserved vertebral body heights without evidence of fracture. Cord: As seen on today's earlier lumbar MRI, there is patchy T2 hyperintensity in the distal thoracic spinal cord, with the most discrete lesion being on the left at T12. There is a small T2 hyperintense focus in the left central cord at T6-7. A small lesion versus artifact is questioned at T3. There is the suggestion of focal enhancement along the midline dorsal cord at T5 on axial images (series 21, image 21), however this is not confirmed on sagittal images. Paraspinal and other soft tissues: Unremarkable. Disc levels: Endplate spurring results in mild left neural foraminal stenosis at T1-2. A right posterolateral/foraminal disc osteophyte at T2-3 results in mild right neural foraminal stenosis. A shallow left paracentral disc osteophyte complex and left facet hypertrophy at T6-7 result in mild spinal stenosis and mild left neural foraminal stenosis. A slightly  larger left paracentral disc osteophyte complex at T7-8 combines with facet hypertrophy to result in mild spinal stenosis. There is at most mild disc bulging more inferiorly in the thoracic spine without significant stenosis. IMPRESSION: 1. Scattered T2 hyperintense lesions throughout the cervical and thoracic spinal cord as well as in the pons, medulla, and cerebellum. A lesion at C6-7 is enhancing. While some lesions are associated with spinal stenosis and could in isolation reflect spondylotic myelopathy, the presence of lesions elsewhere in the cord and posterior fossa are indicative of a more widespread process such as demyelinating disease. Infection, inflammatory conditions such as sarcoidosis, and metastatic disease are additional considerations. Brain MRI and CSF  analysis are recommended. 2. 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 3. Mild spinal stenosis at T6-7 and T7-8 due to disc and facet degeneration. 4. Diffusely abnormal bone marrow signal. Considerations include multiple myeloma, metastatic disease, and other infiltrative/myelofibrotic marrow processes. Electronically Signed   By: Logan Bores M.D.   On: 03/21/2017 21:44   Dylan Thoracic Spine W Wo Contrast  Result Date: 03/21/2017 CLINICAL DATA:  Bilateral lower extremity weakness. Urinary incontinence. Abnormal distal spinal cord on lumbar spine MRI today. EXAM: MRI CERVICAL AND THORACIC SPINE WITHOUT AND WITH CONTRAST TECHNIQUE: Multiplanar and multiecho pulse sequences of the cervical spine, to include the craniocervical junction and cervicothoracic junction, and thoracic spine, were obtained without and with intravenous contrast. CONTRAST:  49m MULTIHANCE GADOBENATE DIMEGLUMINE 529 MG/ML IV SOLN COMPARISON:  Lumbar spine MRI today FINDINGS: MRI CERVICAL SPINE FINDINGS The study is moderately motion degraded. Alignment: Cervical spine straightening.  No significant listhesis.  Vertebrae: Diffuse heterogeneous replacement of normal marrow signal throughout the spine as described on prior lumbar MRI. No dominant enhancing lesion. Preserved vertebral body heights without evidence of fracture. Cord: There is abnormal T2 hyperintensity in the right lateral spinal cord at C3-4 extending over a length of approximately 1.5 cm. Smaller areas of abnormal T2 signal are present at the superior C6 endplate level and at the C6-7 disc space level, with the latter demonstrating enhancement. Posterior Fossa, vertebral arteries, paraspinal tissues: Discrete 4 mm T2 hyperintense nonenhancing lesion in the medulla. Additional T2 hyperintense lesions in the pons and left cerebellum. Disc levels: The cervical spinal canal is small in caliber diffusely on a congenital basis. C2-3: Negative. C3-4: Shallow central disc protrusion without significant stenosis. C4-5: Mild disc bulging and uncovertebral spurring result in mild spinal stenosis and moderate bilateral neural foraminal stenosis. C5-6: Broad-based posterior disc osteophyte complex results in moderate spinal stenosis with mild cord flattening and moderate to severe bilateral neural foraminal stenosis. C6-7: Broad-based posterior disc osteophyte complex and central disc extrusion result in severe spinal stenosis with moderate cord flattening and severe bilateral neural foraminal stenosis. C7-T1: Mild uncovertebral spurring results in at most mild bilateral neural foraminal stenosis. No spinal stenosis. MRI THORACIC SPINE FINDINGS The study is mildly motion degraded. Alignment: Normal. Vertebrae: Diffuse heterogeneous replacement of normal marrow signal throughout the spine and ribs as well as visualized clavicles and sternum. No dominant enhancing lesion. Preserved vertebral body heights without evidence of fracture. Cord: As seen on today's earlier lumbar MRI, there is patchy T2 hyperintensity in the distal thoracic spinal cord, with the most discrete  lesion being on the left at T12. There is a small T2 hyperintense focus in the left central cord at T6-7. A small lesion versus artifact is questioned at T3. There is the suggestion of focal enhancement along the midline dorsal cord at T5 on axial images (series 21, image 21), however this is not confirmed on sagittal images. Paraspinal and other soft tissues: Unremarkable. Disc levels: Endplate spurring results in mild left neural foraminal stenosis at T1-2. A right posterolateral/foraminal disc osteophyte at T2-3 results in mild right neural foraminal stenosis. A shallow left paracentral disc osteophyte complex and left facet hypertrophy at T6-7 result in mild spinal stenosis and mild left neural foraminal stenosis. A slightly larger left paracentral disc osteophyte complex at T7-8 combines with facet hypertrophy to result in mild spinal stenosis. There is at most mild disc bulging more inferiorly in the thoracic spine without significant stenosis. IMPRESSION: 1.  Scattered T2 hyperintense lesions throughout the cervical and thoracic spinal cord as well as in the pons, medulla, and cerebellum. A lesion at C6-7 is enhancing. While some lesions are associated with spinal stenosis and could in isolation reflect spondylotic myelopathy, the presence of lesions elsewhere in the cord and posterior fossa are indicative of a more widespread process such as demyelinating disease. Infection, inflammatory conditions such as sarcoidosis, and metastatic disease are additional considerations. Brain MRI and CSF analysis are recommended. 2. 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 3. Mild spinal stenosis at T6-7 and T7-8 due to disc and facet degeneration. 4. Diffusely abnormal bone marrow signal. Considerations include multiple myeloma, metastatic disease, and other infiltrative/myelofibrotic marrow processes. Electronically Signed   By: Logan Bores M.D.   On: 03/21/2017  21:44   Dg C-arm 1-60 Min  Result Date: 03/25/2017 CLINICAL DATA:  Cervical spine fusion. EXAM: DG C-ARM 61-120 MIN; DG CERVICAL SPINE - 1 VIEW COMPARISON:  03/23/2017. FINDINGS: Lower cervical anterior and interbody fusion. Hardware intact. Anatomic alignment. Surgical sponges noted over the lower anterior neck. IMPRESSION: Lower cervical anterior interbody fusion with anatomic alignment. Electronically Signed   By: Marcello Moores  Register   On: 03/25/2017 15:47   Dg Fluoro Guide Lumbar Puncture  Result Date: 03/22/2017 CLINICAL DATA:  Lower extremity weakness. Spinal cord lesions on MRI. Severe multilevel lumbar stenosis. EXAM: DIAGNOSTIC LUMBAR PUNCTURE UNDER FLUOROSCOPIC GUIDANCE FLUOROSCOPY TIME:  Fluoroscopy Time:  0 minutes, 42 seconds Radiation Exposure Index (if provided by the fluoroscopic device): 8.9 mGy Number of Acquired Spot Images: 0 PROCEDURE: I discussed the risks (including hemorrhage, infection, headache, and nerve damage, among others), benefits, and alternatives to fluoroscopically guided lumbar puncture with the patient. We specifically discussed the high technical likelihood of success of the procedure. The patient understood and elected to undergo the procedure. Prior to the procedure I reviewed of the patient's lumbar spine MRI and determined that L5-S1 was the optimum level. Standard time-out was employed. Following sterile skin prep and local anesthetic administration consisting of 1 percent lidocaine, a 20 gauge spinal needle was advanced without difficulty into the thecal sac at the at the L5-S1 level from a slightly paramedian approach. Clear CSF was returned. Opening pressure was not obtained. 15 cc of clear CSF was collected. The needle was subsequently removed and the skin cleansed and bandaged. No immediate complications were observed. IMPRESSION: 1. Technically successful fluoroscopically lumbar puncture at the L5-S1 level yielding 15 cc of clear CSF. Electronically Signed   By:  Van Clines M.D.   On: 03/22/2017 17:10     Assessment and Plan:   Principal Problem: 1.  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  Persistent atrial fibrillation with rapid ventricular response (HCC) - pt currently on amio gtt at 30 mg/hr - also on Cardizem 60 mg q 8 hr but did not get dose last pm or this am due to low BP - continue amio - he has been sinus brady at times, afib is always fast. - L atrium mildly dilated, R atrium normal, should be able to maintain SR - if amio does not maintain SR, consider Tikosyn   3. Anticoag - CHA2DS2VASc= 1 (HTN) - on heparin now  Otherwise, per IM   Uncontrolled diabetes mellitus type 2 without complications ([place])   Hypertension   Bone lesion   Morbid obesity ([place])  Paroxysmal atrial fibrillation (HCC)   New onset a-fib ([place])   Multiple sclerosis exacerbation ([place])     For questions or updates, please contact [place] Please consult [email or web address] for contact info under Cardiology/STEMI.   Signed, Rosaria Ferries, PA-C  03/31/2017 10:20 AM

## 2017-03-31 NOTE — Progress Notes (Signed)
ANTICOAGULATION CONSULT NOTE - Follow Up Consult  Pharmacy Consult for Heparin>Eliquis Indication: atrial fibrillation  No Known Allergies  Patient Measurements: Height: 6\' 4"  (193 cm) Weight: (!) 301 lb 5.9 oz (136.7 kg) IBW/kg (Calculated) : 86.8 Heparin Dosing Weight: 115 kg  Vital Signs: Temp: 99.9 F (37.7 C) (02/27 1142) Temp Source: Oral (02/27 1142) BP: 106/64 (02/27 1142) Pulse Rate: 96 (02/27 1142)  Labs: Recent Labs    03/28/17 1349 03/28/17 1823  03/29/17 0357  03/29/17 1848 03/30/17 0049 03/30/17 0843 03/31/17 0753  HGB 15.5  --   --  15.0  --   --   --  14.0 12.4*  HCT 43.8  --   --  43.0  --   --   --  40.9 35.5*  PLT 208  --   --  195  --   --   --  208 182  APTT 27  --   --   --   --   --   --   --   --   LABPROT 16.6*  --   --   --   --   --   --   --   --   INR 1.36  --   --   --   --   --   --   --   --   HEPARINUNFRC  --   --    < > 0.89*   < > 0.60 0.66 0.35  --   CREATININE  --  1.07  --  1.00  --   --   --   --  1.47*   < > = values in this interval not displayed.    Estimated Creatinine Clearance: 91.8 mL/min (A) (by C-G formula based on SCr of 1.47 mg/dL (H)).  Assessment:  52 yr old male presented on 03/21/17 with generalized weakness, worse in right leg.  S/p cervical decompression/discectomy fusion C5-C6, C6-7 corpectomy on 03/25/17. MRI  consistent with Multiple sclerosis-new diagnosis.     Heparin drip started 2/24 for new onset Afib then stopped on 2/26.  Heparin drip resumed ~8 am for recurrent afib.  Now to change to Eliquis.  Hematuria on 2/26 early am, urine now clearer.  Goal of Therapy:  Heparin level 0.3-0.7 units/ml appropriate Eliquis dose for indication Monitor platelets by anticoagulation protocol: Yes   Plan:    Eliquis 5 mg PO BID.  Stop IV heparin when giving first Eliquis dose.  Follow up for any further hematuria or any other s/sx bleeding.  Dennie Fetters, RPh Pager: [phone]) 03/31/2017,11:54  AM

## 2017-03-31 NOTE — Progress Notes (Signed)
ANTICOAGULATION CONSULT NOTE - Initial Consult  Pharmacy Consult for heparin Indication: atrial fibrillation  No Known Allergies  Patient Measurements: Height: 6\' 4"  (193 cm) Weight: (!) 301 lb 5.9 oz (136.7 kg) IBW/kg (Calculated) : 86.8 Heparin Dosing Weight: 115kg  Vital Signs: Temp: 99.5 F (37.5 C) (02/27 0457) Temp Source: Oral (02/27 0457) BP: 100/74 (02/27 0457) Pulse Rate: 79 (02/27 0425)  Labs: Recent Labs    03/28/17 1349 03/28/17 1823  03/29/17 0357  03/29/17 1848 03/30/17 0049 03/30/17 0843  HGB 15.5  --   --  15.0  --   --   --  14.0  HCT 43.8  --   --  43.0  --   --   --  40.9  PLT 208  --   --  195  --   --   --  208  APTT 27  --   --   --   --   --   --   --   LABPROT 16.6*  --   --   --   --   --   --   --   INR 1.36  --   --   --   --   --   --   --   HEPARINUNFRC  --   --    < > 0.89*   < > 0.60 0.66 0.35  CREATININE  --  1.07  --  1.00  --   --   --   --    < > = values in this interval not displayed.    Estimated Creatinine Clearance: 135 mL/min (by C-G formula based on SCr of 1 mg/dL).   Medical History: Past Medical History:  Diagnosis Date  . 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 Diabetes mellitus without complication (HCC)   . Hypertension     Assessment: 52yo male had been on heparin earlier in admission for Afib, was to be discharged but was canceled, now back in Afib, to resume heparin.  Goal of Therapy:  Heparin level 0.3-0.7 units/ml Monitor platelets by anticoagulation protocol: Yes   Plan:  Will start heparin gtt at 1000 units/hr (previously therapeutic at this rate) and monitor heparin levels and CBC.  Vernard Gambles, PharmD, BCPS  03/31/2017,7:43 AM

## 2017-03-31 NOTE — Progress Notes (Signed)
Sent page to MD. Patient converted back to afib with RVR. EKG taken and in chart.

## 2017-03-31 NOTE — Progress Notes (Signed)
Paged MD. Cardizem drip at 10mg /hr. BP 105/70. HR in the 140s. Unable to increase drip rate more due to low BP

## 2017-03-31 NOTE — Progress Notes (Addendum)
Patient with a fever of 102.6 earlier tonight. 650 mg of tylenol given. Temperature taken 2 hours later and came down to 101.6. Notified [facility]  Bolus NS 500 ml given. Blood cultures to be drawn.  Patient afebrile now 02/28 0030

## 2017-03-31 NOTE — Progress Notes (Addendum)
I have been notified by Financial counselor that patient has been scheduled for a Medicaid appointment for Friday at 0945 and has also been referred for a disability appointment. Patient will have to be able to demonstrate the ability to tolerate more intense therapies before considering an inpt rehab admit. Pt is aware that SNF may have to be pursued. I have notified SW. [phone]

## 2017-04-01 ENCOUNTER — Inpatient Hospital Stay (HOSPITAL_COMMUNITY): Payer: Medicaid Other

## 2017-04-01 DIAGNOSIS — D72829 Elevated white blood cell count, unspecified: Secondary | ICD-10-CM

## 2017-04-01 LAB — BASIC METABOLIC PANEL
Anion gap: 11 (ref 5–15)
BUN: 53 mg/dL — ABNORMAL HIGH (ref 6–20)
CHLORIDE: 106 mmol/L (ref 101–111)
CO2: 23 mmol/L (ref 22–32)
Calcium: 8.5 mg/dL — ABNORMAL LOW (ref 8.9–10.3)
Creatinine, Ser: 1.79 mg/dL — ABNORMAL HIGH (ref 0.61–1.24)
GFR calc non Af Amer: 43 mL/min — ABNORMAL LOW (ref >60)
GFR, EST AFRICAN AMERICAN: 50 mL/min — AB (ref >60)
Glucose, Bld: 232 mg/dL — ABNORMAL HIGH (ref 65–99)
POTASSIUM: 4 mmol/L (ref 3.5–5.1)
SODIUM: 140 mmol/L (ref 135–145)

## 2017-04-01 LAB — URINALYSIS, ROUTINE W REFLEX MICROSCOPIC
BILIRUBIN URINE: NEGATIVE
GLUCOSE, UA: NEGATIVE mg/dL
KETONES UR: NEGATIVE mg/dL
NITRITE: NEGATIVE
PH: 5 (ref 5.0–8.0)
Protein, ur: 30 mg/dL — AB
Specific Gravity, Urine: 1.017 (ref 1.005–1.030)
Squamous Epithelial / LPF: NONE SEEN

## 2017-04-01 LAB — CBC
HEMATOCRIT: 35.5 % — AB (ref 39.0–52.0)
HEMOGLOBIN: 12.2 g/dL — AB (ref 13.0–17.0)
MCH: 29.9 pg (ref 26.0–34.0)
MCHC: 34.4 g/dL (ref 30.0–36.0)
MCV: 87 fL (ref 78.0–100.0)
Platelets: 198 10*3/uL (ref 150–400)
RBC: 4.08 MIL/uL — AB (ref 4.22–5.81)
RDW: 13.2 % (ref 11.5–15.5)
WBC: 25.7 10*3/uL — ABNORMAL HIGH (ref 4.0–10.5)

## 2017-04-01 LAB — GLUCOSE, CAPILLARY
GLUCOSE-CAPILLARY: 252 mg/dL — AB (ref 65–99)
GLUCOSE-CAPILLARY: 326 mg/dL — AB (ref 65–99)
Glucose-Capillary: 277 mg/dL — ABNORMAL HIGH (ref 65–99)
Glucose-Capillary: 262 mg/dL — ABNORMAL HIGH (ref 65–99)

## 2017-04-01 MED ORDER — GADOBENATE DIMEGLUMINE 529 MG/ML IV SOLN
20.0000 mL | Freq: Once | INTRAVENOUS | Status: AC
  Administered 2017-04-01: 20 mL via INTRAVENOUS

## 2017-04-01 MED ORDER — SODIUM CHLORIDE 0.9 % IV SOLN
INTRAVENOUS | Status: DC
  Administered 2017-04-01 – 2017-04-07 (×11): via INTRAVENOUS
  Administered 2017-04-08: 100 mL/h via INTRAVENOUS

## 2017-04-01 MED ORDER — AMIODARONE IV BOLUS ONLY 150 MG/100ML
150.0000 mg | Freq: Once | INTRAVENOUS | Status: AC
  Filled 2017-04-01: qty 100

## 2017-04-01 MED ORDER — SODIUM CHLORIDE 0.9 % IV BOLUS (SEPSIS)
500.0000 mL | Freq: Once | INTRAVENOUS | Status: AC
  Administered 2017-04-01: 500 mL via INTRAVENOUS

## 2017-04-01 MED ORDER — SODIUM CHLORIDE 0.9 % IV SOLN
2.0000 g | Freq: Two times a day (BID) | INTRAVENOUS | Status: DC
  Administered 2017-04-01 – 2017-04-09 (×17): 2 g via INTRAVENOUS
  Filled 2017-04-01 (×18): qty 2

## 2017-04-01 MED ORDER — VANCOMYCIN HCL 10 G IV SOLR
1500.0000 mg | INTRAVENOUS | Status: AC
  Administered 2017-04-01 – 2017-04-03 (×3): 1500 mg via INTRAVENOUS
  Filled 2017-04-01 (×3): qty 1500

## 2017-04-01 NOTE — Progress Notes (Signed)
Neurosurgery input appreciated. I had extensive discussion with the patient's cousin, and then with his aunt over the phone in [place].  Patient is only child-he has no other family members.  Patient lives alone-he has no other family members.    They are aware that patient either has a hematoma or a abscess in his C-spine-and that neurosurgery does not recommend surgical correction given his overall state of health and poor neurological function even after his first surgery.  He still is persistently in A. fib with RVR, blood pressure was briefly low but responded to IV fluids-he has significant leukocytosis and is febrile.  Family is well aware that he could potentially decline further-his neurological status could worsen-and if that happens, we may be looking at hospice care.  Family is for now hopeful that with antibiotics, and other supportive care patient would be will be stable enough to start to begin physical therapy.  Family is aware that his overall prognosis is poor-even if he survives this illness, he may end up with significant residual functional deficits-and probably require SNF placement.  We will follow his clinical condition very closely-and see how he does over the next day or so.

## 2017-04-01 NOTE — Progress Notes (Signed)
Inpatient Diabetes Program Recommendations  AACE/ADA: New Consensus Statement on Inpatient Glycemic Control (2015)  Target Ranges:  Prepandial:   less than 140 mg/dL      Peak postprandial:   less than 180 mg/dL (1-2 hours)      Critically ill patients:  140 - 180 mg/dL   Lab Results  Component Value Date   GLUCAP 262 (H) 04/01/2017   HGBA1C 8.4 (H) 03/22/2017    Review of Glycemic Control Results for Dylan [name], Dylan [name] (MRN [id number]) as of 04/01/2017 12:28  Ref. Range 03/31/2017 16:38 03/31/2017 21:00 04/01/2017 07:26 04/01/2017 11:32  Glucose-Capillary Latest Ref Range: 65 - 99 mg/dL 093 (H) 235 (H) 573 (H) 262 (H)    Diabetes history: Type 2 DM Outpatient Diabetes medicaions: Metformin 500 mg BID Current orders for Inpatient glycemic control: Novolog 0-20 units TIDAC, Lantus 30 Units QHS, Novolog 0-5 Units QHS  Inpatient Diabetes Program Recommendations:    Inconsistent oral intake at this time.   Patient febrile with increasing WBC count, question if this is the reason for increased BS. Patient been off Solumedrol >48 Hrs. At this time, consider Lantus 40 Units QHS (136.82 kg X 0.3).   Thanks, Lujean Rave, MSN, RNC-OB Diabetes Coordinator [phone] (8a-5p)

## 2017-04-01 NOTE — Progress Notes (Signed)
Physical Therapy Treatment Patient Details Name: Dylan [name] MRN: [id number] DOB: [birth year] Today's Date: 04/01/2017    History of Present Illness Patient is a 52 y/o male who presents with 7 month hx of progressive weakness, Right>left, s/p 2 falls at home. 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 Cord compression at C5-6 spinal level. MRI are consistent with MS. s/p C5-6, 6-7 ACDF 2/21. PMH includes HTN, DM.    PT Comments    Pt presents today with no discernable movement R LE, associated movements in L LE/tonal changes/ and some gross movement in UE's L greater than R UE.  Emphasis on Rolling, transition to sit and balance/truncal activation at EOB.  Eventually, BP dropped low enough for presyncope, even with attempts to bolster BP.  Pt returned to supine.    Follow Up Recommendations  CIR;Supervision/Assistance - 24 hour     Equipment Recommendations  None recommended by PT    Recommendations for Other Services Rehab consult     Precautions / Restrictions Precautions Precautions: Fall Precaution Comments: BLE weakness; watch HR, watch BP's in sitting    Mobility  Bed Mobility Overal bed mobility: Needs Assistance Bed Mobility: Rolling;Sidelying to Sit;Sit to Supine Rolling: Max assist;+2 for physical assistance Sidelying to sit: Total assist;+2 for physical assistance   Sit to supine: Total assist;+2 for physical assistance   General bed mobility comments: Cues for sequencing, tactile cues to help pt initiate.  significant assist fro rolling and transitions with minor UE assist  Transfers                 General transfer comment: not safe to attempt transfers without lift at this time.  Ambulation/Gait                 Stairs            Wheelchair  Mobility    Modified Rankin (Stroke Patients Only) Modified Rankin (Stroke Patients Only) Modified Rankin: Severe disability     Balance Overall balance assessment: Needs assistance;History of Falls Sitting-balance support: Feet supported;Bilateral upper extremity supported;Single extremity supported Sitting balance-Leahy Scale: Poor Sitting balance - Comments: pt sat EOB x10 plus min working on truncal activation, spinal extension, reaching tasks.  pt needed maximal to total assist for balance support.                                    Cognition Arousal/Alertness: Awake/alert;Lethargic Behavior During Therapy: WFL for tasks assessed/performed Overall Cognitive Status: Within Functional Limits for tasks assessed                                        Exercises Other Exercises Other Exercises: PROM to bil LE, putting muscles on stretch to help pt activate. Other Exercises: AAROM bil UE's     General Comments General comments (skin integrity, edema, etc.): pt's HR throughout session was sustained between the 130's and 150's with spikes into the 160''s.  BP's were soft overall at 90/50's, but pt was presyncopal after sitting for a while, legs propped up in attempt to keep pt sitting with need to finally return to supine as eyes started to roll back.  Pertinent Vitals/Pain Pain Assessment: Faces Faces Pain Scale: Hurts a little bit Pain Location: back Pain Descriptors / Indicators: Aching Pain Intervention(s): Monitored during session    Home Living                      Prior Function            PT Goals (current goals can now be found in the care plan section) Acute Rehab PT Goals Patient Stated Goal: to return to [facility] and be able to walk PT Goal Formulation: With patient Time For Goal Achievement: 04/08/17 Potential to Achieve Goals: Fair Progress towards PT goals: Progressing toward goals;Goals downgraded-see care plan     Frequency    Min 3X/week      PT Plan Current plan remains appropriate    Co-evaluation              AM-PAC PT "6 Clicks" Daily Activity  Outcome Measure  Difficulty turning over in bed (including adjusting bedclothes, sheets and blankets)?: Unable Difficulty moving from lying on back to sitting on the side of the bed? : Unable Difficulty sitting down on and standing up from a chair with arms (e.g., wheelchair, bedside commode, etc,.)?: Unable Help needed moving to and from a bed to chair (including a wheelchair)?: Total Help needed walking in hospital room?: Total Help needed climbing 3-5 steps with a railing? : Total 6 Click Score: 6    End of Session   Activity Tolerance: Patient limited by fatigue;Other (comment)(limited by soft BP's) Patient left: in bed;with call bell/phone within reach;with bed alarm set Nurse Communication: Mobility status;Need for lift equipment PT Visit Diagnosis: Muscle weakness (generalized) (M62.81);Other abnormalities of gait and mobility (R26.89);Hemiplegia and hemiparesis Hemiplegia - caused by: Unspecified     Time: 2641-5830 PT Time Calculation (min) (ACUTE ONLY): 32 min  Charges:  $Therapeutic Activity: 8-22 mins                    G Codes:       04/12/17  [place] Bing, PT [phone]  (pager)   Dylan [name] 04/12/2017, 4:57 PM

## 2017-04-01 NOTE — Progress Notes (Signed)
Pharmacy Antibiotic Note  Dylan [name] is a 52 y.o. male to begin Vancomycin and Cefepime empiric coverage for possible epidural abscess/infection and sepsis. Blood and urine cultures sent. Tmax 102.6 and WBC up to 25.7.  Creatinine trended up to 1.79.  Had transitioned from IV heparin to Eliquis for afib on 2/27, now stopped.  Plan:  Cefepime 2gm IV q12hrs.  Vancomycin 1500 mg IV q24hrs.  Target Vanc troughs 15-20 mcg/ml.  Follow renal function, culture data, clinical progress.  Vanc trough level at steady state.  Height: 6\' 4"  (193 cm) Weight: (!) 301 lb 5.9 oz (136.7 kg) IBW/kg (Calculated) : 86.8  Temp (24hrs), Avg:99.8 F (37.7 C), Min:98 F (36.7 C), Max:102.6 F (39.2 C)  Recent Labs  Lab 03/27/17 1344 03/28/17 1349 03/28/17 1823 03/29/17 0357 03/30/17 0843 03/31/17 0753 04/01/17 0328  WBC  --  13.2*  --  14.4* 10.4 14.5* 25.7*  CREATININE 1.03  --  1.07 1.00  --  1.47* 1.79*    Estimated Creatinine Clearance: 75.4 mL/min (A) (by C-G formula based on SCr of 1.79 mg/dL (H)).    No Known Allergies  Antimicrobials this admission:   Cefazolin peri-op 2/21>>2/22   Bactroban/CHG 2/21>>2/25   Vancomycin 2/28>>   Cefepime 2/28>>  Dose adjustments this admission:  n/a  Microbiology results:  2/18 CSF anaerobic - no anaerobes isolated  2/18 CSF: negative  2/18 CSF fungal : pending  2/20 MRSA PCR : MRSA negative, Staph aureus positive  2/28 blood x 2 -  2/28 urine -  Thank you for allowing pharmacy to be a part of this patient's care.  Dennie Fetters, [place] Pager: [phone] 04/01/2017 4:13 PM

## 2017-04-01 NOTE — Progress Notes (Signed)
Occupational Therapy Treatment Patient Details Name: Dylan [name] MRN: [id number] DOB: 1965-04-22 Today's Date: 04/01/2017    History of present illness Patient is a 52 y/o male who presents with 7 month hx of progressive weakness, Right>left, s/p 2 falls at home. 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 Cord compression at C5-6 spinal level. MRI are consistent with MS. s/p C5-6, 6-7 ACDF 2/21. PMH includes HTN, DM.   OT comments  Pt with significant bil UE/LE weakness and poor trunk control in sitting. Pt required max-total assist +2 for all aspects of bed mobility. Pt tolerated sitting EOB x5 minutes with max assist for sitting balance. Attempted trunk control activities and UE reaching activities in sitting but pt with presyncopal symptoms and required return to supine. HR sustained in 130s-150s throughout session, BP soft when returned to supine. D/c plan remains appropriate. Will continue to follow acutely.   Follow Up Recommendations  CIR;Supervision/Assistance - 24 hour    Equipment Recommendations  Other (comment)(TBD at next venue of care)    Recommendations for Other Services      Precautions / Restrictions Precautions Precautions: Fall Precaution Comments: BLE weakness; watch HR, watch BP's in sitting Restrictions Weight Bearing Restrictions: No       Mobility Bed Mobility Overal bed mobility: Needs Assistance Bed Mobility: Rolling;Sidelying to Sit;Sit to Supine Rolling: Max assist;+2 for physical assistance Sidelying to sit: Total assist;+2 for physical assistance   Sit to supine: Total assist;+2 for physical assistance   General bed mobility comments: Cues for sequencing, tactile cues to help pt initiate.  significant assist fro rolling and transitions with minor UE  assist  Transfers                 General transfer comment: not safe to attempt transfers without lift at this time.    Balance Overall balance assessment: Needs assistance;History of Falls Sitting-balance support: Feet supported;Bilateral upper extremity supported;Single extremity supported Sitting balance-Leahy Scale: Poor Sitting balance - Comments: pt sat EOB x10 plus min working on truncal activation, spinal extension, reaching tasks.  pt needed maximal to total assist for balance support.                                   ADL either performed or assessed with clinical judgement   ADL Overall ADL's : Needs assistance/impaired Eating/Feeding: Maximal assistance;Sitting Eating/Feeding Details (indicate cue type and reason): Max assist to bring cup to mouth for sips of water                                   General ADL Comments: Pt tolerated sitting EOB x5 minutes with max assist for upright posture. Pt c/o dizziness and with pre-syncopal symptoms; returned pt to supine. HR 130s-150s sustained throughout session, BP soft following return to supine after sitting EOB.      Vision       Perception     Praxis      Cognition Arousal/Alertness: Awake/alert;Lethargic Behavior During Therapy: WFL for tasks assessed/performed Overall Cognitive Status: Within Functional Limits for tasks assessed  Exercises Exercises: Other exercises Other Exercises Other Exercises: PROM to bil LE, putting muscles on stretch to help pt activate. Other Exercises: AAROM bil UE's    Shoulder Instructions       General Comments pt's HR throughout session was sustained between the 130's and 150's with spikes into the 160''s.  BP's were soft overall at 90/50's, but pt was presyncopal after sitting for a while, legs propped up in attempt to keep pt sitting with need to finally return to supine as eyes started  to roll back.    Pertinent Vitals/ Pain       Pain Assessment: Faces Faces Pain Scale: Hurts a little bit Pain Location: back Pain Descriptors / Indicators: Aching Pain Intervention(s): Monitored during session  Home Living                                          Prior Functioning/Environment              Frequency  Min 3X/week        Progress Toward Goals  OT Goals(current goals can now be found in the care plan section)  Progress towards OT goals: Not progressing toward goals - comment(increased weakness, medical complications)  Acute Rehab OT Goals Patient Stated Goal: to return to PLOF and be able to walk OT Goal Formulation: With patient  Plan Discharge plan remains appropriate    Co-evaluation    PT/OT/SLP Co-Evaluation/Treatment: Yes Reason for Co-Treatment: Complexity of the patient's impairments (multi-system involvement);For patient/therapist safety   OT goals addressed during session: Strengthening/ROM      AM-PAC PT "6 Clicks" Daily Activity     Outcome Measure   Help from another person eating meals?: A Lot Help from another person taking care of personal grooming?: A Lot Help from another person toileting, which includes using toliet, bedpan, or urinal?: Total Help from another person bathing (including washing, rinsing, drying)?: Total Help from another person to put on and taking off regular upper body clothing?: Total Help from another person to put on and taking off regular lower body clothing?: Total 6 Click Score: 8    End of Session    OT Visit Diagnosis: Muscle weakness (generalized) (M62.81)   Activity Tolerance Treatment limited secondary to medical complications (Comment)   Patient Left in bed;with call bell/phone within reach   Nurse Communication (pt would benefit from soft touch call button-secretary notif)        Time: 1539-1611 OT Time Calculation (min): 32 min  Charges: OT General Charges $OT  Visit: 1 Visit OT Treatments $Therapeutic Activity: 8-22 mins  Mustapha Colson A. Brett Albino, M.S., OTR/L Pager: [phone]   Gaye Alken 04/01/2017, 6:33 PM

## 2017-04-01 NOTE — Progress Notes (Signed)
Responded to [facility] to support patient . Patient gone for test.  Will follow as needed. Dylan [name], [place], [facility], Pager [phone]

## 2017-04-01 NOTE — Progress Notes (Signed)
Progress Note  Patient Name: Dylan [name] Date of Encounter: 04/01/2017  Primary Cardiologist: Chrystie Nose, MD   Subjective   Patient without complaints this morning. Denies CP, palpitations, or SOB. Surgical pain is well controlled.  Inpatient Medications    Scheduled Meds: . apixaban  5 mg Oral BID  . B-complex with vitamin C  1 tablet Oral Daily  . diltiazem  30 mg Oral Q8H  . insulin aspart  0-20 Units Subcutaneous TID WC  . insulin aspart  0-5 Units Subcutaneous QHS  . insulin glargine  35 Units Subcutaneous QHS  . lisinopril  10 mg Oral Daily  . pantoprazole  40 mg Oral Daily  . sodium chloride flush  3 mL Intravenous Q12H   Continuous Infusions: . sodium chloride    . amiodarone 30 mg/hr (04/01/17 0403)  . amiodarone    . lactated ringers    . methocarbamol (ROBAXIN)  IV     PRN Meds: acetaminophen **OR** acetaminophen, bisacodyl, menthol-cetylpyridinium **OR** phenol, methocarbamol **OR** methocarbamol (ROBAXIN)  IV, metoprolol tartrate, ondansetron **OR** ondansetron (ZOFRAN) IV, senna-docusate   Vital Signs    Vitals:   03/31/17 2219 04/01/17 0027 04/01/17 0435 04/01/17 0729  BP:  104/79 109/84 102/79  Pulse:  79 66 (!) 136  Resp:  20 (!) 28 (!) 27  Temp: (!) 101.6 F (38.7 C) 99.5 F (37.5 C) 98.9 F (37.2 C) 98 F (36.7 C)  TempSrc: Oral Oral Oral Axillary  SpO2:  100% 97% 99%  Weight:      Height:        Intake/Output Summary (Last 24 hours) at 04/01/2017 0815 Last data filed at 04/01/2017 1610 Gross per 24 hour  Intake 1615.92 ml  Output 2350 ml  Net -734.08 ml   Filed Weights   03/21/17 1713 03/22/17 0839 03/31/17 0457  Weight: (!) 350 lb (158.8 kg) 288 lb 9.3 oz (130.9 kg) (!) 301 lb 5.9 oz (136.7 kg)    Telemetry    Atrial fibrillation with RVR (rate consistently >130s) with short periods of NSR, occasional PVCs - Personally Reviewed  ECG    Afib RVR 2/27 - Personally Reviewed  Physical Exam   GEN: Somnolent but  answers questions appropriately. Obese male laying in bed in no acute distress.   Neck: No JVD, no carotid bruits; anterior neck surgical dressing C/D/I Cardiac: IRRR, no murmurs, rubs, or gallops.  Respiratory: Clear to auscultation bilaterally, no wheezes/ rales/ rhonchi GI: NABS, Soft, nontender, non-distended  MS: No edema; No deformity. Neuro:  Nonfocal, moving all extremities spontaneously Psych: Normal affect   Labs    Chemistry Recent Labs  Lab 03/29/17 0357 03/31/17 0753 04/01/17 0328  NA 138 136 140  K 4.0 4.4 4.0  CL 103 103 106  CO2 25 21* 23  GLUCOSE 144* 261* 232*  BUN 24* 42* 53*  CREATININE 1.00 1.47* 1.79*  CALCIUM 8.4* 8.3* 8.5*  PROT  --  5.9*  --   ALBUMIN  --  2.5*  --   AST  --  18  --   ALT  --  36  --   ALKPHOS  --  45  --   BILITOT  --  0.7  --   GFRNONAA >60 54* 43*  GFRAA >60 >60 50*  ANIONGAP 10 12 11      Hematology Recent Labs  Lab 03/30/17 0843 03/31/17 0753 04/01/17 0328  WBC 10.4 14.5* 25.7*  RBC 4.66 4.04* 4.08*  HGB 14.0 12.4* 12.2*  HCT  40.9 35.5* 35.5*  MCV 87.8 87.9 87.0  MCH 30.0 30.7 29.9  MCHC 34.2 34.9 34.4  RDW 13.1 13.2 13.2  PLT 208 182 198    Cardiac EnzymesNo results for input(s): TROPONINI in the last 168 hours. No results for input(s): TROPIPOC in the last 168 hours.   BNPNo results for input(s): BNP, PROBNP in the last 168 hours.   DDimer No results for input(s): DDIMER in the last 168 hours.   Radiology    Ct Head Wo Contrast  Result Date: 03/31/2017 CLINICAL DATA:  Initial evaluation for generalized weakness, most prevalent within the right lower extremity. EXAM: CT HEAD WITHOUT CONTRAST TECHNIQUE: Contiguous axial images were obtained from the base of the skull through the vertex without intravenous contrast. COMPARISON:  Prior MRI from 03/23/2017. FINDINGS: Brain: Mildly advanced cerebral atrophy for age. Multiple scattered hypodensities seen throughout the periventricular and deep white matter both  cerebral hemispheres, several of which radiate in a perpendicular fashion from the lateral ventricles, again concerning for demyelinating disease. Findings are grossly similar relative to recent MRI. No acute intracranial hemorrhage. No acute large vessel territory infarct. No mass lesion, midline shift or mass effect. No hydrocephalus. No extra-axial fluid collection. Vascular: Scalp soft tissues and calvarium within normal limits. Skull: Globes orbital soft tissues within normal limits. Paranasal sinuses and mastoid air cells are clear. Sinuses/Orbits: None. Other: None. IMPRESSION: 1. Scattered cerebral white-matter hypodensities in a distribution most consistent with underlying demyelinating disease, grossly stable relative to recent MRI. 2. No other new intracranial abnormality identified. Electronically Signed   By: Rise Mu M.D.   On: 03/31/2017 19:04   Dg Chest Port 1v Same Day  Result Date: 04/01/2017 CLINICAL DATA:  Shortness of breath, atrial fibrillation, diabetes, multiple scleroses. EXAM: PORTABLE CHEST 1 VIEW COMPARISON:  Chest x-ray of March 21, 2017 FINDINGS: The lungs are well-expanded. There is subtle increased interstitial density in the lower third of the right lung. The heart and pulmonary vascularity are normal. The mediastinum is normal in width with exception of soft tissue fullness in the right paratracheal region. IMPRESSION: Probable subsegmental atelectasis at the right lung base.  No CHF. Soft tissue fullness in the right paratracheal region is more conspicuous than on the previous study. This may reflect lymphadenopathy, intrathoracic border, or vascular structure. Given the patient's lower anterior cervical fusion 1 week ago, hematoma or infection may be present. Contrast-enhanced CT scanning of the neck and chest is recommended. Electronically Signed   By: David  [place] M.D.   On: 04/01/2017 08:10    Cardiac Studies   ECHO: 03/30/2017 - Left ventricle: The  cavity size was normal. There was mild concentric hypertrophy. Systolic function was normal. The estimated ejection fraction was in the range of 50% to 55%. Although no diagnostic regional wall motion abnormality was identified, this possibility cannot be completely excluded on the basis of this study. Doppler parameters are consistent with abnormal left ventricular relaxation (grade 1 diastolic dysfunction). - Left atrium: The atrium was mildly dilated.   Patient Profile     52 y.o. male with a hx of DM, HTN, back pain/sciatica, severe cervical stenosis s/p anterior cervical decompression/discectomy fusion and corpectomy 03/25/17, hospital course complicated by atrial fibrillation with RVR. Cardiology following for Afib management.   Assessment & Plan    1. Atrial fibrillation with RVR: persistent new onset atrial fibrillation with RVR. Still with HR consistently above 130s despite amiodarone gtt and po diltiazem. BP still soft limiting ability to titrate diltiazem.  -  CHA2DS2VASc = 2 (HTN and DM) - ultimately would benefit from anticoagulation going forward. - Eliquis on hold given new/worsening LE weakness.  - Will given amiodarone bolus this AM and continue gtt. May need to consider additive/alternative therapies if Afib RVR continues - Continue diltiazem 30mg  q8h  2. S/P anterior cervical spine decompression, discectomy fusion, and corpectomy: patient with complaints of B/L LE weakness yesterday, thought to be worse today by primary team. Also with fever overnight. C/f possible surgical site bleed vs abscess. - MRI spine ordered - Eliquis on hold - if MRI negative for bleeding can likely resume once cleared - Management per primary team  3. HTN: BP soft  - Continue low dose diltiazem  4. DM type 2: Hgb A1C 8.4 - Continue management per primary team   For questions or updates, please contact [facility] Please consult [email or web address] for contact info under  Cardiology/STEMI.      Signed, Beatriz Stallion, PA-C  04/01/2017, 8:15 AM   [phone]

## 2017-04-01 NOTE — Progress Notes (Addendum)
PROGRESS NOTE        PATIENT DETAILS Name: Dylan [name] Age: 52 y.o. Sex: male Date of Birth: 08-20-1965 Admit Date: 03/21/2017 Admitting Physician Vianne Bulls, MD MIW:[id number], No Pcp Per  Brief Narrative: Patient is a 52 y.o. male with history of DM-2, hypertension who presented with generalized weakness but mostly with progressive weakness in his right leg-patient was evaluated by neurology-and underwent workup which demonstrated cord compression at C5-C6, and C6-C7 level due to severe cervical stenosis, patient was also diagnosed with demyelinating disease (MS vs NMO).  Patient was started on IV steroids, and neurosurgery was consulted, patient underwent corpectomy at C6 with decompression on 2/21.  Further hospital course was complicated by development of atrial fibrillation with RVR.  His rate was then appropriately controlled with plans to discharge to CIR on 2/26, however patient developed lethargy and discharge was held, unfortunately on 2/27, patient was found to have A. fib with rapid response-requiring initiation of IV amiodarone, reinitiation of heparin and cardiology evaluation.  Hospital course now has been complicated by development of fever, leukocytosis and acute kidney injury.  Please see  below for further details.  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 Given fever-leukocytosis-on anticoagulation-and ?changed exam (?more left leg weakness) will go ahead and get a stat MRI Cspine.  Spoke with Dr. Kathyrn Sheriff (neurosurgery)-recommends MRI as well-if significant findings on MRI will formally reconsult neurosurgery.  Addendum: Spoke earlier with Dr. Lenor Derrick C-spine results noted-have placed a call to neurosurgery-left message with the answering  service-awaiting callback.  Addendum:spoke with Dr Ronnald Ramp (Neurosurgery)-he will evaluate.  Newly diagnosed multiple sclerosis: Completed course of IV steroids, plans were to follow-up with [facility] neurology-Dr. Felecia Shelling.Currently awaiting MRI C Spine-if no structural lesions seen-may need to be restarted on IV Steroids. Spoke with Neuro MD today-they will follow and provide recommendations.    A. fib with RVR: Continues to be in atrial fibrillation with RVR this morning-remains on IV amiodarone-anticoagulation stopped this am due to above noted concern. Cards following. Echocardiogram showed EF of around 50-55%.  Chads 2 Vas score is 1.  Acute kidney injury: Likely hemodynamically mediated-in the setting of fever, A. fib RVR, and the use of ACE inhibitor-stop lisinopril, start gentle hydration.  Follow electrolytes.     Fever with leukocytosis: Given recent cervical spine surgery-obviously because of concern in the operative site-however will go ahead and check UA, chest x-ray and blood cultures.  Await MRI of the C-spine-he is relatively stable-hold off on starting antimicrobial therapy until we figure out a source of infection.  DM 2 with hyperglycemia: CBGs relatively stable-but still on the higher side of things-for now we will keep on 35 units of Lantus and SSI.    Hypertension: Controlled, continue with lisinopril and Cardizem.   Morbid obesity  Telemetry (independently reviewed): Afib with RVR  Echo (reviewed):EF 50-55% on TTE done on 2/26  Morning labs/Imaging ordered: yes  DVT Prophylaxis: Anticoagulation currently on hold  Code Status: Full code   Family Communication: Left msg for cousin on 2/27 and 2/28 (X 2)  Disposition Plan: Remain inpatient-not stable for discharge-we will require several more days of hospitalization  Antimicrobial agents: Anti-infectives (From admission, onward)   Start  Dose/Rate Route Frequency Ordered Stop   03/25/17 1900  ceFAZolin (ANCEF)  IVPB 2g/100 mL premix     2 g 200 mL/hr over 30 Minutes Intravenous Every 8 hours 03/25/17 1830 03/26/17 2227   03/25/17 1404  bacitracin 50,000 Units in sodium chloride irrigation 0.9 % 500 mL irrigation  Status:  Discontinued       As needed 03/25/17 1404 03/25/17 1551   03/25/17 1200  ceFAZolin (ANCEF) 3 g in dextrose 5 % 50 mL IVPB     3 g 130 mL/hr over 30 Minutes Intravenous 30 min pre-op 03/24/17 1535 03/25/17 1325      Procedures: 2/21>> cervical spine decompression  CONSULTS:  cardiology, neurology and Neurosurgery  Time spent: 45 minutes-Greater than 50% of this time was spent in counseling, explanation of diagnosis, planning of further management, and coordination of care.  MEDICATIONS: Scheduled Meds: . B-complex with vitamin C  1 tablet Oral Daily  . diltiazem  30 mg Oral Q8H  . insulin aspart  0-20 Units Subcutaneous TID WC  . insulin aspart  0-5 Units Subcutaneous QHS  . insulin glargine  35 Units Subcutaneous QHS  . pantoprazole  40 mg Oral Daily  . sodium chloride flush  3 mL Intravenous Q12H   Continuous Infusions: . sodium chloride 75 mL/hr at 04/01/17 0845  . amiodarone 30 mg/hr (04/01/17 0855)  . lactated ringers    . methocarbamol (ROBAXIN)  IV     PRN Meds:.acetaminophen **OR** acetaminophen, bisacodyl, menthol-cetylpyridinium **OR** phenol, methocarbamol **OR** methocarbamol (ROBAXIN)  IV, metoprolol tartrate, ondansetron **OR** ondansetron (ZOFRAN) IV, senna-docusate   PHYSICAL EXAM: Vital signs: Vitals:   03/31/17 2219 04/01/17 0027 04/01/17 0435 04/01/17 0729  BP:  104/79 109/84 102/79  Pulse:  79 66 (!) 136  Resp:  20 (!) 28 (!) 27  Temp: (!) 101.6 F (38.7 C) 99.5 F (37.5 C) 98.9 F (37.2 C) 98 F (36.7 C)  TempSrc: Oral Oral Oral Axillary  SpO2:  100% 97% 99%  Weight:      Height:       Filed Weights   03/21/17 1713 03/22/17 0839 03/31/17 0457  Weight: (!) 158.8 kg (350 lb) 130.9 kg (288 lb 9.3 oz) (!) 136.7 kg (301 lb 5.9 oz)     Body mass index is 36.68 kg/m.   General appearance :Awake, alert, not in any distress.  Looks chronically sick appearing and frail. Eyes:, pupils equally reactive to light and accomodation,no scleral icterus. HEENT: Atraumatic and Normocephalic Neck: supple, no JVD. Resp:Good air entry bilaterally, CVS: S1 S2 regular, no murmurs.  GI: Bowel sounds present, Non tender and not distended with no gaurding, rigidity or rebound. Extremities: No edema Neurology: Left wrist drop-distal lower extremities appear to be very weak-today he was not able to move his left lower extremity at all, his right lower extremity appears to be essentially unchanged compared to yesterday.  Musculoskeletal:No digital cyanosis Skin:No Rash, warm and dry Wounds:N/A  I have personally reviewed following labs and imaging studies  LABORATORY DATA: CBC: Recent Labs  Lab 03/28/17 1349 03/29/17 0357 03/30/17 0843 03/31/17 0753 04/01/17 0328  WBC 13.2* 14.4* 10.4 14.5* 25.7*  HGB 15.5 15.0 14.0 12.4* 12.2*  HCT 43.8 43.0 40.9 35.5* 35.5*  MCV 86.7 86.9 87.8 87.9 87.0  PLT 208 195 208 182 704    Basic Metabolic Panel: Recent Labs  Lab 03/27/17 1344 03/28/17 1823 03/29/17 0357 03/31/17 0753 04/01/17 0328  NA 138 137 138 136 140  K 4.1 3.9 4.0 4.4 4.0  CL 97* 102 103 103 106  CO2 22 25 25  21* 23  GLUCOSE 266* 167* 144* 261* 232*  BUN 24* 24* 24* 42* 53*  CREATININE 1.03 1.07 1.00 1.47* 1.79*  CALCIUM 8.0* 8.4* 8.4* 8.3* 8.5*    GFR: Estimated Creatinine Clearance: 75.4 mL/min (A) (by C-G formula based on SCr of 1.79 mg/dL (H)).  Liver Function Tests: Recent Labs  Lab 03/31/17 0753  AST 18  ALT 36  ALKPHOS 45  BILITOT 0.7  PROT 5.9*  ALBUMIN 2.5*   No results for input(s): LIPASE, AMYLASE in the last 168 hours. No results for input(s): AMMONIA in the last 168 hours.  Coagulation Profile: Recent Labs  Lab 03/28/17 1349  INR 1.36    Cardiac Enzymes: No results for input(s):  CKTOTAL, CKMB, CKMBINDEX, TROPONINI in the last 168 hours.  BNP (last 3 results) No results for input(s): PROBNP in the last 8760 hours.  HbA1C: No results for input(s): HGBA1C in the last 72 hours.  CBG: Recent Labs  Lab 03/31/17 0740 03/31/17 1125 03/31/17 1638 03/31/17 2100 04/01/17 0726  GLUCAP 272* 267* 235* 251* 277*    Lipid Profile: No results for input(s): CHOL, HDL, LDLCALC, TRIG, CHOLHDL, LDLDIRECT in the last 72 hours.  Thyroid Function Tests: No results for input(s): TSH, T4TOTAL, FREET4, T3FREE, THYROIDAB in the last 72 hours.  Anemia Panel: No results for input(s): VITAMINB12, FOLATE, FERRITIN, TIBC, IRON, RETICCTPCT in the last 72 hours.  Urine analysis: No results found for: COLORURINE, APPEARANCEUR, LABSPEC, PHURINE, GLUCOSEU, HGBUR, BILIRUBINUR, KETONESUR, PROTEINUR, UROBILINOGEN, NITRITE, LEUKOCYTESUR  Sepsis Labs: Lactic Acid, Venous No results found for: LATICACIDVEN  MICROBIOLOGY: Recent Results (from the past 240 hour(s))  Anaerobic culture     Status: None   Collection Time: 03/22/17  4:30 PM  Result Value Ref Range Status   Specimen Description CSF  Final   Special Requests NONE  Final   Culture   Final    NO ANAEROBES ISOLATED Performed at [facility], [address] [place], [place] [address]    Report Status 03/29/2017 FINAL  Final  CSF culture     Status: None   Collection Time: 03/22/17  4:30 PM  Result Value Ref Range Status   Specimen Description CSF  Final   Special Requests NONE  Final   Gram Stain   Final    WBC PRESENT, PREDOMINANTLY MONONUCLEAR NO ORGANISMS SEEN CYTOSPIN SMEAR    Culture   Final    NO GROWTH 3 DAYS Performed at [facility], [place] [address], [place], [place] [address]    Report Status 03/26/2017 FINAL  Final  Fungus Culture With Stain     Status: None (Preliminary result)   Collection Time: 03/22/17  4:30 PM  Result Value Ref Range Status   Fungus Stain Final report  Final    Comment:  (NOTE) Performed At: [facility] [place], [place] [id number] Rush Farmer MD QZ:[id number]    Fungus (Mycology) Culture PENDING  Incomplete   Fungal Source CSF  Final  Fungus Culture Result     Status: None   Collection Time: 03/22/17  4:30 PM  Result Value Ref Range Status   Result 1 Comment  Final    Comment: (NOTE) KOH/Calcofluor preparation:  no fungus observed. Performed At: [facility] [place], [place] [id number] Rush Farmer MD SL:[id number]   Surgical PCR screen     Status: Abnormal   Collection Time: 03/24/17 10:26 PM  Result  Value Ref Range Status   MRSA, PCR NEGATIVE NEGATIVE Final   Staphylococcus aureus POSITIVE (A) NEGATIVE Final    Comment: (NOTE) The Xpert SA Assay (FDA approved for NASAL specimens in patients 17 years of age and older), is one component of a comprehensive surveillance program. It is not intended to diagnose infection nor to guide or monitor treatment. Performed at [facility], [place] [address], [place], [place] [address]     RADIOLOGY STUDIES/RESULTS: Dg Chest 2 View  Result Date: 03/21/2017 CLINICAL DATA:  Right-sided weakness. EXAM: CHEST  2 VIEW COMPARISON:  None. FINDINGS: AP and lateral views of the chest were obtained. The lungs are clear without focal pneumonia, edema, pneumothorax or pleural effusion. Minimal atelectasis noted left base. The cardiopericardial silhouette is within normal limits for size. The visualized bony structures of the thorax are intact. Telemetry leads overlie the chest. IMPRESSION: No active cardiopulmonary disease. Electronically Signed   By: Misty Stanley M.D.   On: 03/21/2017 13:08   Dg Cervical Spine 1 View  Result Date: 03/25/2017 CLINICAL DATA:  Cervical spine fusion. EXAM: DG C-ARM 61-120 MIN; DG CERVICAL SPINE - 1 VIEW COMPARISON:  03/23/2017. FINDINGS: Lower cervical anterior and interbody fusion. Hardware intact. Anatomic alignment. Surgical  sponges noted over the lower anterior neck. IMPRESSION: Lower cervical anterior interbody fusion with anatomic alignment. Electronically Signed   By: Marcello Moores  Register   On: 03/25/2017 15:47   Ct Head Wo Contrast  Result Date: 03/31/2017 CLINICAL DATA:  Initial evaluation for generalized weakness, most prevalent within the right lower extremity. EXAM: CT HEAD WITHOUT CONTRAST TECHNIQUE: Contiguous axial images were obtained from the base of the skull through the vertex without intravenous contrast. COMPARISON:  Prior MRI from 03/23/2017. FINDINGS: Brain: Mildly advanced cerebral atrophy for age. Multiple scattered hypodensities seen throughout the periventricular and deep white matter both cerebral hemispheres, several of which radiate in a perpendicular fashion from the lateral ventricles, again concerning for demyelinating disease. Findings are grossly similar relative to recent MRI. No acute intracranial hemorrhage. No acute large vessel territory infarct. No mass lesion, midline shift or mass effect. No hydrocephalus. No extra-axial fluid collection. Vascular: Scalp soft tissues and calvarium within normal limits. Skull: Globes orbital soft tissues within normal limits. Paranasal sinuses and mastoid air cells are clear. Sinuses/Orbits: None. Other: None. IMPRESSION: 1. Scattered cerebral white-matter hypodensities in a distribution most consistent with underlying demyelinating disease, grossly stable relative to recent MRI. 2. No other new intracranial abnormality identified. Electronically Signed   By: Jeannine Boga M.D.   On: 03/31/2017 19:04   Mr Jeri Cos LO Contrast  Result Date: 03/23/2017 CLINICAL DATA:  Progressive RIGHT-sided weakness for 7 months. History of hypertension, diabetes. Follow-up cervical spinal cord lesions. EXAM: MRI HEAD WITHOUT AND WITH CONTRAST TECHNIQUE: Multiplanar, multiecho pulse sequences of the brain and surrounding structures were obtained without and with  intravenous contrast. CONTRAST:  22m MULTIHANCE GADOBENATE DIMEGLUMINE 529 MG/ML IV SOLN COMPARISON:  None. FINDINGS: INTRACRANIAL CONTENTS: Faint reduced diffusion bilateral occipital lobes, extending toward splenium of corpus callosum with normalized or increased diffusivity. At least 5 subcentimeter posterior fossa lesions. 4 mm T2 bright lesion ventral spinal cord. Far greater than 10 supratentorial white matter lesions, some of which correspond to diffusion abnormality, many of which radiate from the peri ventricular margin with low T1 signal seen with black holes of demyelination. Dominant lesion LEFT frontal periventricular white matter measuring 18 mm. Additional juxta cortical and deep white matter lesions. Two 9 mm lesions LEFT  thalamus extending to LEFT hypothalamus. 4 mm lesion RIGHT basal ganglia mild parenchymal brain volume loss. No hydrocephalus. Faint marginal enhancement bilateral occipital lobe lesion (coronal 8/28 and 4/28 post contrast T1). No susceptibility artifact to suggest hemorrhage. No midline shift, mass effect. No abnormal extra-axial fluid collections or extra-axial enhancement. VASCULAR: Normal major intracranial vascular flow voids present at skull base. SKULL AND UPPER CERVICAL SPINE: No abnormal sellar expansion. No suspicious calvarial bone marrow signal. Craniocervical junction maintained. SINUSES/ORBITS: The mastoid air-cells and included paranasal sinuses are well-aerated.T2 bright signal LEFT optic nerve intracanalicular segment suspected, no definite enhancement though limited assessment. OTHER: None. IMPRESSION: 1. At least 5 infratentorial and greater than 10 supratentorial lesions consistent with chronic demyelination, with acute to subacute enhancing lesions bilateral occipital lobes. Subcentimeter LEFT thalamus and RIGHT basal ganglia demyelinating plaques. 2. Probable LEFT optic neuritis. 3. Mild parenchymal brain volume loss for age. Electronically Signed   By:  Elon Alas M.D.   On: 03/23/2017 22:20   Mr Lumbar Spine Wo Contrast  Result Date: 03/21/2017 CLINICAL DATA:  Increasing leg weakness.  Urinary incontinence. EXAM: MRI LUMBAR SPINE WITHOUT CONTRAST TECHNIQUE: Multiplanar, multisequence MR imaging of the lumbar spine was performed. No intravenous contrast was administered. COMPARISON:  None. FINDINGS: Segmentation:  Assumed standard. Alignment: Mild straightening of the lumbar lordosis. Sagittal alignment is maintained. Vertebrae: Diffusely heterogeneous marrow signal throughout the lumbar spine with multifocal areas of abnormal marrow replacement. Similar appearing marrow signal abnormality is seen in the visualized sacrum, bilateral iliac bones, and bilateral lower thoracic ribs. Conus medullaris and cauda equina: Conus extends to the L1-L2 level. Patchy abnormal signal within the visualized lower thoracic cord. Clumping of the proximal cauda equina nerve roots due to downstream stenosis. Paraspinal and other soft tissues: Negative. Disc levels: T10-T11: Right lateral and anterolateral disc osteophyte complex. No stenosis. T10-T11: Right lateral disc osteophyte complex.  No stenosis. T12-L1:  Negative. L1-L2:  Negative. L2-L3: Diffuse disc bulge with superimposed large central disc protrusion and mild left greater than right facet arthropathy resulting in severe central spinal canal stenosis. No neuroforaminal stenosis. L3-L4: Diffuse disc bulge with superimposed central and left paracentral disc protrusion. Mild bilateral facet arthropathy. Severe central spinal canal stenosis and moderate left neuroforaminal stenosis. L4-L5: Diffuse disc bulge, slightly asymmetric to the left. Moderate bilateral facet arthropathy. Severe bilateral lateral recess stenosis. Moderate central spinal canal stenosis. Mild to moderate bilateral neuroforaminal stenosis. L5-S1: Diffuse disc bulge with superimposed central disc protrusion. Mild narrowing of the bilateral lateral  recesses. Disc material abuts the descending conjoined left S1 and S2 nerve roots. Mild left and severe right neuroforaminal stenosis. IMPRESSION: 1. Diffusely heterogeneous marrow signal throughout the visualized thoracolumbar spine, pelvis, and lower thoracic ribs, concerning for multiple myeloma or metastatic disease. 2. Patchy, abnormal signal throughout the visualized lower thoracic spinal cord, nonspecific. Recommend further evaluation with MRI of the cervical and thoracic spine with and without contrast. 3. Multilevel degenerative changes throughout the lumbar spine with severe central spinal canal stenosis at L2-L3 and L3-L4, likely accounting for the patient's symptoms. 4. Severe right neuroforaminal stenosis at L5-S1. Moderate left neuroforaminal stenosis at L3-L4. Electronically Signed   By: Titus Dubin M.D.   On: 03/21/2017 16:27   Mr Cervical Spine W Or Wo Contrast  Result Date: 03/21/2017 CLINICAL DATA:  Bilateral lower extremity weakness. Urinary incontinence. Abnormal distal spinal cord on lumbar spine MRI today. EXAM: MRI CERVICAL AND THORACIC SPINE WITHOUT AND WITH CONTRAST TECHNIQUE: Multiplanar and multiecho pulse sequences of the cervical spine,  to include the craniocervical junction and cervicothoracic junction, and thoracic spine, were obtained without and with intravenous contrast. CONTRAST:  69m MULTIHANCE GADOBENATE DIMEGLUMINE 529 MG/ML IV SOLN COMPARISON:  Lumbar spine MRI today FINDINGS: MRI CERVICAL SPINE FINDINGS The study is moderately motion degraded. Alignment: Cervical spine straightening.  No significant listhesis. Vertebrae: Diffuse heterogeneous replacement of normal marrow signal throughout the spine as described on prior lumbar MRI. No dominant enhancing lesion. Preserved vertebral body heights without evidence of fracture. Cord: There is abnormal T2 hyperintensity in the right lateral spinal cord at C3-4 extending over a length of approximately 1.5 cm. Smaller areas  of abnormal T2 signal are present at the superior C6 endplate level and at the C6-7 disc space level, with the latter demonstrating enhancement. Posterior Fossa, vertebral arteries, paraspinal tissues: Discrete 4 mm T2 hyperintense nonenhancing lesion in the medulla. Additional T2 hyperintense lesions in the pons and left cerebellum. Disc levels: The cervical spinal canal is small in caliber diffusely on a congenital basis. C2-3: Negative. C3-4: Shallow central disc protrusion without significant stenosis. C4-5: Mild disc bulging and uncovertebral spurring result in mild spinal stenosis and moderate bilateral neural foraminal stenosis. C5-6: Broad-based posterior disc osteophyte complex results in moderate spinal stenosis with mild cord flattening and moderate to severe bilateral neural foraminal stenosis. C6-7: Broad-based posterior disc osteophyte complex and central disc extrusion result in severe spinal stenosis with moderate cord flattening and severe bilateral neural foraminal stenosis. C7-T1: Mild uncovertebral spurring results in at most mild bilateral neural foraminal stenosis. No spinal stenosis. MRI THORACIC SPINE FINDINGS The study is mildly motion degraded. Alignment: Normal. Vertebrae: Diffuse heterogeneous replacement of normal marrow signal throughout the spine and ribs as well as visualized clavicles and sternum. No dominant enhancing lesion. Preserved vertebral body heights without evidence of fracture. Cord: As seen on today's earlier lumbar MRI, there is patchy T2 hyperintensity in the distal thoracic spinal cord, with the most discrete lesion being on the left at T12. There is a small T2 hyperintense focus in the left central cord at T6-7. A small lesion versus artifact is questioned at T3. There is the suggestion of focal enhancement along the midline dorsal cord at T5 on axial images (series 21, image 21), however this is not confirmed on sagittal images. Paraspinal and other soft tissues:  Unremarkable. Disc levels: Endplate spurring results in mild left neural foraminal stenosis at T1-2. A right posterolateral/foraminal disc osteophyte at T2-3 results in mild right neural foraminal stenosis. A shallow left paracentral disc osteophyte complex and left facet hypertrophy at T6-7 result in mild spinal stenosis and mild left neural foraminal stenosis. A slightly larger left paracentral disc osteophyte complex at T7-8 combines with facet hypertrophy to result in mild spinal stenosis. There is at most mild disc bulging more inferiorly in the thoracic spine without significant stenosis. IMPRESSION: 1. Scattered T2 hyperintense lesions throughout the cervical and thoracic spinal cord as well as in the pons, medulla, and cerebellum. A lesion at C6-7 is enhancing. While some lesions are associated with spinal stenosis and could in isolation reflect spondylotic myelopathy, the presence of lesions elsewhere in the cord and posterior fossa are indicative of a more widespread process such as demyelinating disease. Infection, inflammatory conditions such as sarcoidosis, and metastatic disease are additional considerations. Brain MRI and CSF analysis are recommended. 2. 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 3. Mild spinal stenosis at T6-7 and T7-8 due to disc and facet degeneration. 4. Diffusely  abnormal bone marrow signal. Considerations include multiple myeloma, metastatic disease, and other infiltrative/myelofibrotic marrow processes. Electronically Signed   By: Logan Bores M.D.   On: 03/21/2017 21:44   Mr Thoracic Spine W Wo Contrast  Result Date: 03/21/2017 CLINICAL DATA:  Bilateral lower extremity weakness. Urinary incontinence. Abnormal distal spinal cord on lumbar spine MRI today. EXAM: MRI CERVICAL AND THORACIC SPINE WITHOUT AND WITH CONTRAST TECHNIQUE: Multiplanar and multiecho pulse sequences of the cervical spine, to include the  craniocervical junction and cervicothoracic junction, and thoracic spine, were obtained without and with intravenous contrast. CONTRAST:  42m MULTIHANCE GADOBENATE DIMEGLUMINE 529 MG/ML IV SOLN COMPARISON:  Lumbar spine MRI today FINDINGS: MRI CERVICAL SPINE FINDINGS The study is moderately motion degraded. Alignment: Cervical spine straightening.  No significant listhesis. Vertebrae: Diffuse heterogeneous replacement of normal marrow signal throughout the spine as described on prior lumbar MRI. No dominant enhancing lesion. Preserved vertebral body heights without evidence of fracture. Cord: There is abnormal T2 hyperintensity in the right lateral spinal cord at C3-4 extending over a length of approximately 1.5 cm. Smaller areas of abnormal T2 signal are present at the superior C6 endplate level and at the C6-7 disc space level, with the latter demonstrating enhancement. Posterior Fossa, vertebral arteries, paraspinal tissues: Discrete 4 mm T2 hyperintense nonenhancing lesion in the medulla. Additional T2 hyperintense lesions in the pons and left cerebellum. Disc levels: The cervical spinal canal is small in caliber diffusely on a congenital basis. C2-3: Negative. C3-4: Shallow central disc protrusion without significant stenosis. C4-5: Mild disc bulging and uncovertebral spurring result in mild spinal stenosis and moderate bilateral neural foraminal stenosis. C5-6: Broad-based posterior disc osteophyte complex results in moderate spinal stenosis with mild cord flattening and moderate to severe bilateral neural foraminal stenosis. C6-7: Broad-based posterior disc osteophyte complex and central disc extrusion result in severe spinal stenosis with moderate cord flattening and severe bilateral neural foraminal stenosis. C7-T1: Mild uncovertebral spurring results in at most mild bilateral neural foraminal stenosis. No spinal stenosis. MRI THORACIC SPINE FINDINGS The study is mildly motion degraded. Alignment: Normal.  Vertebrae: Diffuse heterogeneous replacement of normal marrow signal throughout the spine and ribs as well as visualized clavicles and sternum. No dominant enhancing lesion. Preserved vertebral body heights without evidence of fracture. Cord: As seen on today's earlier lumbar MRI, there is patchy T2 hyperintensity in the distal thoracic spinal cord, with the most discrete lesion being on the left at T12. There is a small T2 hyperintense focus in the left central cord at T6-7. A small lesion versus artifact is questioned at T3. There is the suggestion of focal enhancement along the midline dorsal cord at T5 on axial images (series 21, image 21), however this is not confirmed on sagittal images. Paraspinal and other soft tissues: Unremarkable. Disc levels: Endplate spurring results in mild left neural foraminal stenosis at T1-2. A right posterolateral/foraminal disc osteophyte at T2-3 results in mild right neural foraminal stenosis. A shallow left paracentral disc osteophyte complex and left facet hypertrophy at T6-7 result in mild spinal stenosis and mild left neural foraminal stenosis. A slightly larger left paracentral disc osteophyte complex at T7-8 combines with facet hypertrophy to result in mild spinal stenosis. There is at most mild disc bulging more inferiorly in the thoracic spine without significant stenosis. IMPRESSION: 1. Scattered T2 hyperintense lesions throughout the cervical and thoracic spinal cord as well as in the pons, medulla, and cerebellum. A lesion at C6-7 is enhancing. While some lesions are associated with spinal stenosis and  could in isolation reflect spondylotic myelopathy, the presence of lesions elsewhere in the cord and posterior fossa are indicative of a more widespread process such as demyelinating disease. Infection, inflammatory conditions such as sarcoidosis, and metastatic disease are additional considerations. Brain MRI and CSF analysis are recommended. 2. Cervical disc  degeneration with severe spinal stenosis at C6-7 and moderate spinal stenosis at C5-6. 3. Mild spinal stenosis at T6-7 and T7-8 due to disc and facet degeneration. 4. Diffusely abnormal bone marrow signal. Considerations include multiple myeloma, metastatic disease, and other infiltrative/myelofibrotic marrow processes. Electronically Signed   By: Logan Bores M.D.   On: 03/21/2017 21:44   Dg Chest Port 1v Same Day  Result Date: 04/01/2017 CLINICAL DATA:  Shortness of breath, atrial fibrillation, diabetes, multiple scleroses. EXAM: PORTABLE CHEST 1 VIEW COMPARISON:  Chest x-ray of March 21, 2017 FINDINGS: The lungs are well-expanded. There is subtle increased interstitial density in the lower third of the right lung. The heart and pulmonary vascularity are normal. The mediastinum is normal in width with exception of soft tissue fullness in the right paratracheal region. IMPRESSION: Probable subsegmental atelectasis at the right lung base.  No CHF. Soft tissue fullness in the right paratracheal region is more conspicuous than on the previous study. This may reflect lymphadenopathy, intrathoracic border, or vascular structure. Given the patient's lower anterior cervical fusion 1 week ago, hematoma or infection may be present. Contrast-enhanced CT scanning of the neck and chest is recommended. Electronically Signed   By: David  Martinique M.D.   On: 04/01/2017 08:10   Dg C-arm 1-60 Min  Result Date: 03/25/2017 CLINICAL DATA:  Cervical spine fusion. EXAM: DG C-ARM 61-120 MIN; DG CERVICAL SPINE - 1 VIEW COMPARISON:  03/23/2017. FINDINGS: Lower cervical anterior and interbody fusion. Hardware intact. Anatomic alignment. Surgical sponges noted over the lower anterior neck. IMPRESSION: Lower cervical anterior interbody fusion with anatomic alignment. Electronically Signed   By: Marcello Moores  Register   On: 03/25/2017 15:47   Dg Fluoro Guide Lumbar Puncture  Result Date: 03/22/2017 CLINICAL DATA:  Lower extremity  weakness. Spinal cord lesions on MRI. Severe multilevel lumbar stenosis. EXAM: DIAGNOSTIC LUMBAR PUNCTURE UNDER FLUOROSCOPIC GUIDANCE FLUOROSCOPY TIME:  Fluoroscopy Time:  0 minutes, 42 seconds Radiation Exposure Index (if provided by the fluoroscopic device): 8.9 mGy Number of Acquired Spot Images: 0 PROCEDURE: I discussed the risks (including hemorrhage, infection, headache, and nerve damage, among others), benefits, and alternatives to fluoroscopically guided lumbar puncture with the patient. We specifically discussed the high technical likelihood of success of the procedure. The patient understood and elected to undergo the procedure. Prior to the procedure I reviewed of the patient's lumbar spine MRI and determined that L5-S1 was the optimum level. Standard time-out was employed. Following sterile skin prep and local anesthetic administration consisting of 1 percent lidocaine, a 20 gauge spinal needle was advanced without difficulty into the thecal sac at the at the L5-S1 level from a slightly paramedian approach. Clear CSF was returned. Opening pressure was not obtained. 15 cc of clear CSF was collected. The needle was subsequently removed and the skin cleansed and bandaged. No immediate complications were observed. IMPRESSION: 1. Technically successful fluoroscopically lumbar puncture at the L5-S1 level yielding 15 cc of clear CSF. Electronically Signed   By: Van Clines M.D.   On: 03/22/2017 17:10     LOS: 11 days   Oren Binet, MD  [facility] Pager:[phone]  If 7PM-7AM, please contact night-coverage [email or web address] Password [facility] 04/01/2017, 10:27 AM

## 2017-04-01 NOTE — Progress Notes (Signed)
Patient ID: Dylan [name], male   DOB: January 06, 1966, 52 y.o.   MRN: [id number] We were called to come back and see this patient who underwent a C6 corpectomy one week ago by Dr. Conchita Paris. Patient had preoperative weakness and contractures of the hands and significant weakness of the right lower extremity and distal left lower extremity. It appears from notes he was getting better up until about 3 days ago. He went into atrial fibrillation with rapid ventricular response. He has been anticoagulated  (anticoagulation within 1 week of an anterior cervical corpectomy would in my opinion carry a higher risk of hematoma formation/ airway compromise / neurologic deterioration than the risk of stroke from atrial fibrillation which to my understanding is only 2% per year) and his eliquis was stopped today and I think his last dose was yesterday. He had mental status changes and a CT scan of the head was done yesterday. He has leukocytosis and fever and an MRI of the cervical spine was done today. He has a history of MS.  I asked him if he had increased weakness or numbness and at first he stated "no." On examination I found him to be motor complete below the C6 level whith spared crude touch sensation in the hands but not the trunk or legs. Therefore, it appears he is complete below C8. He states he has not moved his legs or hands since "Monday." At first I thought he may be confused and reminded him that today is Thursday, and he states "nope that seems about right." Yesterday's physical therapy was discontinued because he was maximum assist to sit and they felt it was unsafe for the patient and the therapist to continue.  MRI shows hematoma behind the corpectomy graft with moderate to moderately severe stenosis, signal change in the cord from C2-C7 with swelling of the cord, old MS lesions around C7, graft and plate appear to be okay. There is a large subcutaneous hematoma also.   Regarding his leukocytosis and  fever, it is extremely uncommon for anterior cervical surgeries to get infected. The national rate appears to be 0.05%. Therefore, I think the risks of irrigation and debridement of the wound for outweigh the potential benefits, given the fact that he is essentially anticoagulated with less than optimal reversal options (we find at surgery that, even after "reversal" of anticoagulants the patients are still quite "oozy", therefore the risk of any surgery in the anterior cervical spine would carry significant risk of recurrent hematoma and airway compromise and would therefore carry a much higher risk of mortality and morbidity) and the chance that this hematoma/wound is infected would appear to be quite small. His incision is clean and dry and intact and he is really nontender with no significant swelling of the neck and no drainage from the wound. He denies significant neck pain.  Surgical decision making for resection of the hematoma is more complex. I have discussed this with 3 of my partners including Dr. Conchita Paris. At this point, we think that the risks of surgery far outweigh the potential benefits. Evacuation of the hematoma is very unlikely to reverse his neurologic deficit especially given the fact that he is more than 24 hours into his deficit and more likely 48-72 hours, and given the appearance of the signal change in the spinal cord from C2-C7. Also, the oral anticoagulant will make the surgery very difficult to "dry up" and therefore it is very likely that we would trade a chronic epidural clot with  an acute clot after reinsertion of the corpectomy graft. Placing a drain typically does not stop this occurrence. He would also likely have airway compromise which could make the mortality risk of the surgery quite high. He could be kept intubated for a couple of days after surgery, but this does not stop the risk of hematoma formation because of the oral anticoagulant, even if it were "reversed."  Therefore, we think that further surgery has a higher probability of making things worse rather than better. This is an extremely unfortunate situation for this gentleman. I have tried to call his cousin to discuss the situation with her also. I did leave a message. I have discussed the situation with the patient.

## 2017-04-02 DIAGNOSIS — A419 Sepsis, unspecified organism: Principal | ICD-10-CM

## 2017-04-02 DIAGNOSIS — I4891 Unspecified atrial fibrillation: Secondary | ICD-10-CM

## 2017-04-02 LAB — CBC WITH DIFFERENTIAL/PLATELET
BASOS PCT: 0 %
Basophils Absolute: 0 10*3/uL (ref 0.0–0.1)
EOS ABS: 0 10*3/uL (ref 0.0–0.7)
Eosinophils Relative: 0 %
HCT: 33.3 % — ABNORMAL LOW (ref 39.0–52.0)
Hemoglobin: 11.5 g/dL — ABNORMAL LOW (ref 13.0–17.0)
LYMPHS ABS: 1.9 10*3/uL (ref 0.7–4.0)
Lymphocytes Relative: 7 %
MCH: 30.1 pg (ref 26.0–34.0)
MCHC: 34.5 g/dL (ref 30.0–36.0)
MCV: 87.2 fL (ref 78.0–100.0)
MONO ABS: 2.2 10*3/uL — AB (ref 0.1–1.0)
Monocytes Relative: 8 %
NEUTROS ABS: 22.9 10*3/uL — AB (ref 1.7–7.7)
Neutrophils Relative %: 85 %
PLATELETS: 216 10*3/uL (ref 150–400)
RBC: 3.82 MIL/uL — ABNORMAL LOW (ref 4.22–5.81)
RDW: 13.5 % (ref 11.5–15.5)
WBC: 27 10*3/uL — ABNORMAL HIGH (ref 4.0–10.5)

## 2017-04-02 LAB — GLUCOSE, CAPILLARY
GLUCOSE-CAPILLARY: 178 mg/dL — AB (ref 65–99)
GLUCOSE-CAPILLARY: 234 mg/dL — AB (ref 65–99)
Glucose-Capillary: 217 mg/dL — ABNORMAL HIGH (ref 65–99)
Glucose-Capillary: 260 mg/dL — ABNORMAL HIGH (ref 65–99)

## 2017-04-02 LAB — COMPREHENSIVE METABOLIC PANEL
ALT: 31 U/L (ref 17–63)
AST: 18 U/L (ref 15–41)
Albumin: 2.1 g/dL — ABNORMAL LOW (ref 3.5–5.0)
Alkaline Phosphatase: 43 U/L (ref 38–126)
Anion gap: 9 (ref 5–15)
BUN: 49 mg/dL — ABNORMAL HIGH (ref 6–20)
CALCIUM: 8.1 mg/dL — AB (ref 8.9–10.3)
CHLORIDE: 113 mmol/L — AB (ref 101–111)
CO2: 22 mmol/L (ref 22–32)
Creatinine, Ser: 1.45 mg/dL — ABNORMAL HIGH (ref 0.61–1.24)
GFR calc non Af Amer: 55 mL/min — ABNORMAL LOW (ref >60)
Glucose, Bld: 236 mg/dL — ABNORMAL HIGH (ref 65–99)
Potassium: 4 mmol/L (ref 3.5–5.1)
SODIUM: 144 mmol/L (ref 135–145)
Total Bilirubin: 0.8 mg/dL (ref 0.3–1.2)
Total Protein: 5.7 g/dL — ABNORMAL LOW (ref 6.5–8.1)

## 2017-04-02 MED ORDER — INSULIN ASPART 100 UNIT/ML ~~LOC~~ SOLN
6.0000 [IU] | Freq: Three times a day (TID) | SUBCUTANEOUS | Status: DC
  Administered 2017-04-03 – 2017-04-09 (×21): 6 [IU] via SUBCUTANEOUS

## 2017-04-02 MED ORDER — DIGOXIN 0.25 MG/ML IJ SOLN
0.2500 mg | Freq: Four times a day (QID) | INTRAMUSCULAR | Status: AC
Start: 2017-04-02 — End: 2017-04-02
  Administered 2017-04-02 (×2): 0.25 mg via INTRAVENOUS
  Filled 2017-04-02 (×2): qty 2

## 2017-04-02 MED ORDER — DIGOXIN 0.25 MG/ML IJ SOLN
0.5000 mg | Freq: Once | INTRAMUSCULAR | Status: AC
  Administered 2017-04-02: 0.5 mg via INTRAVENOUS
  Filled 2017-04-02: qty 2

## 2017-04-02 MED ORDER — INSULIN GLARGINE 100 UNIT/ML ~~LOC~~ SOLN
40.0000 [IU] | Freq: Every day | SUBCUTANEOUS | Status: DC
  Administered 2017-04-02: 40 [IU] via SUBCUTANEOUS
  Filled 2017-04-02: qty 0.4

## 2017-04-02 MED ORDER — DEXAMETHASONE SODIUM PHOSPHATE 4 MG/ML IJ SOLN
4.0000 mg | Freq: Four times a day (QID) | INTRAMUSCULAR | Status: DC
  Administered 2017-04-02 – 2017-04-04 (×10): 4 mg via INTRAVENOUS
  Filled 2017-04-02 (×11): qty 1

## 2017-04-02 NOTE — Progress Notes (Signed)
Progress Note  Patient Name: Dylan [name] Date of Encounter: 04/02/2017  Primary Cardiologist: Chrystie Nose, MD   Subjective   Patient without complaints this AM. Somnolent but answers questions appropriately. Still with persistent upper/lower extremity weakness/numbness. Denies CP, SOB, palpitations.    Inpatient Medications    Scheduled Meds: . B-complex with vitamin C  1 tablet Oral Daily  . dexamethasone  4 mg Intravenous Q6H  . diltiazem  30 mg Oral Q8H  . insulin aspart  0-20 Units Subcutaneous TID WC  . insulin aspart  0-5 Units Subcutaneous QHS  . insulin glargine  35 Units Subcutaneous QHS  . pantoprazole  40 mg Oral Daily  . sodium chloride flush  3 mL Intravenous Q12H   Continuous Infusions: . sodium chloride 150 mL/hr at 04/02/17 0651  . amiodarone 30 mg/hr (04/02/17 0651)  . ceFEPime (MAXIPIME) IV Stopped (04/02/17 0230)  . methocarbamol (ROBAXIN)  IV    . vancomycin Stopped (04/01/17 1750)   PRN Meds: acetaminophen **OR** acetaminophen, bisacodyl, menthol-cetylpyridinium **OR** phenol, methocarbamol **OR** methocarbamol (ROBAXIN)  IV, metoprolol tartrate, ondansetron **OR** ondansetron (ZOFRAN) IV, senna-docusate   Vital Signs    Vitals:   04/02/17 0539 04/02/17 0651 04/02/17 0652 04/02/17 0800  BP: 102/79 109/67  114/85  Pulse: (!) 112 (!) 114 (!) 128   Resp: 20 18    Temp:    98.7 F (37.1 C)  TempSrc:    Oral  SpO2: 100% 99%  97%  Weight:      Height:        Intake/Output Summary (Last 24 hours) at 04/02/2017 0919 Last data filed at 04/02/2017 0400 Gross per 24 hour  Intake 4515.05 ml  Output 2750 ml  Net 1765.05 ml   Filed Weights   03/21/17 1713 03/22/17 0839 03/31/17 0457  Weight: (!) 350 lb (158.8 kg) 288 lb 9.3 oz (130.9 kg) (!) 301 lb 5.9 oz (136.7 kg)    Telemetry    Afib RVR with brief episodes of NSR - Personally Reviewed   Physical Exam   GEN: Somnolent, obese gentleman laying in bed in no acute distress.   Neck: No  JVD, no carotid bruits Cardiac: IRRR, no murmurs, rubs, or gallops.  Respiratory: Clear to auscultation bilaterally, no wheezes/ rales/ rhonchi GI: NABS, Soft, nontender, non-distended  MS: No edema; No deformity. Neuro:  b/l upper/lower extremity weakness/numbness Psych: Normal affect   Labs    Chemistry Recent Labs  Lab 03/31/17 0753 04/01/17 0328 04/02/17 0403  NA 136 140 144  K 4.4 4.0 4.0  CL 103 106 113*  CO2 21* 23 22  GLUCOSE 261* 232* 236*  BUN 42* 53* 49*  CREATININE 1.47* 1.79* 1.45*  CALCIUM 8.3* 8.5* 8.1*  PROT 5.9*  --  5.7*  ALBUMIN 2.5*  --  2.1*  AST 18  --  18  ALT 36  --  31  ALKPHOS 45  --  43  BILITOT 0.7  --  0.8  GFRNONAA 54* 43* 55*  GFRAA >60 50* >60  ANIONGAP 12 11 9      Hematology Recent Labs  Lab 03/31/17 0753 04/01/17 0328 04/02/17 0403  WBC 14.5* 25.7* 27.0*  RBC 4.04* 4.08* 3.82*  HGB 12.4* 12.2* 11.5*  HCT 35.5* 35.5* 33.3*  MCV 87.9 87.0 87.2  MCH 30.7 29.9 30.1  MCHC 34.9 34.4 34.5  RDW 13.2 13.2 13.5  PLT 182 198 216    Cardiac EnzymesNo results for input(s): TROPONINI in the last 168 hours. No results  for input(s): TROPIPOC in the last 168 hours.   BNPNo results for input(s): BNP, PROBNP in the last 168 hours.   DDimer No results for input(s): DDIMER in the last 168 hours.   Radiology    Ct Head Wo Contrast  Result Date: 03/31/2017 CLINICAL DATA:  Initial evaluation for generalized weakness, most prevalent within the right lower extremity. EXAM: CT HEAD WITHOUT CONTRAST TECHNIQUE: Contiguous axial images were obtained from the base of the skull through the vertex without intravenous contrast. COMPARISON:  Prior MRI from 03/23/2017. FINDINGS: Brain: Mildly advanced cerebral atrophy for age. Multiple scattered hypodensities seen throughout the periventricular and deep white matter both cerebral hemispheres, several of which radiate in a perpendicular fashion from the lateral ventricles, again concerning for demyelinating  disease. Findings are grossly similar relative to recent MRI. No acute intracranial hemorrhage. No acute large vessel territory infarct. No mass lesion, midline shift or mass effect. No hydrocephalus. No extra-axial fluid collection. Vascular: Scalp soft tissues and calvarium within normal limits. Skull: Globes orbital soft tissues within normal limits. Paranasal sinuses and mastoid air cells are clear. Sinuses/Orbits: None. Other: None. IMPRESSION: 1. Scattered cerebral white-matter hypodensities in a distribution most consistent with underlying demyelinating disease, grossly stable relative to recent MRI. 2. No other new intracranial abnormality identified. Electronically Signed   By: Rise Mu M.D.   On: 03/31/2017 19:04   Mr Cervical Spine W Wo Contrast  Addendum Date: 04/01/2017   ADDENDUM REPORT: 04/01/2017 12:08 ADDENDUM: Critical Value/emergent results were called by telephone at the time of interpretation on 04/01/2017 at 1150 hours to Dr. Jeoffrey Massed , who verbally acknowledged these results. He advises that the patient's hemoglobin value has trended down 3 points in the past four days, arguing in favor of the abnormal fluid collection in this study representing hematoma. We discussed, however, that the possibility of abscess is not excluded by imaging. Electronically Signed   By: Odessa Fleming M.D.   On: 04/01/2017 12:08   Result Date: 04/01/2017 CLINICAL DATA:  52 year old male postoperative day 7 status post C6 corpectomy, anterior cervical spine instrumentation and ACDF spanning C5-C7 4 decompression of spinal cord and exiting nerve roots. Fever overnight, bilateral lower extremity weakness. Evidence of diffuse demyelinating disease in the brain and spine. Status post a course of steroids. History of type 2 diabetes, morbid obesity. EXAM: MRI CERVICAL SPINE WITHOUT AND WITH CONTRAST TECHNIQUE: Multiplanar and multiecho pulse sequences of the cervical spine, to include the craniocervical  junction and cervicothoracic junction, were obtained without and with intravenous contrast. CONTRAST:  20mL MULTIHANCE GADOBENATE DIMEGLUMINE 529 MG/ML IV SOLN COMPARISON:  Preoperative cervical spine MRI 03/21/2017. FINDINGS: Alignment: Straightening of the cervical spine. No spondylolisthesis. Vertebrae: Hardware susceptibility artifact from the C5 to the C7 level. Evidence of anterior corpectomy of C6. Underlying diffuse abnormal bone marrow signal throughout the visible spine. No marrow edema or evidence of acute osseous abnormality. Cord: New confluent abnormal T2 and STIR hyperintensity within the cervical spinal cord from the C3-C4 to the C5-C6 cord level. This has the appearance of holo cord edema on sagittal T2 series 4, image 10. There were superimposed multifocal patchy T2 and STIR hyperintense cord lesions, and 1 such lesion is redemonstrated at the C6-C7 cord level just inferior to the holo cord abnormality. Following contrast there is only faint residual intramedullary enhancement corresponding to the C6-C7 lesion. There is questionable mild increased leptomeningeal enhancement of the cord above C6-C7. The spinal cord below C7 and above the C2 body appears normal.  Stable visible brainstem with small medullary and pontine nonenhancing lesions. Posterior Fossa, vertebral arteries, paraspinal tissues: There is a large prevertebral fluid collection tracking inferiorly from the C4 level into the upper thorax, and tracking laterally to the right at the C5-C6 level probably along the surgical approach. In the upper thorax the collection then tracks posteriorly into the right lateral paraspinal soft tissues. The collection is best demonstrated on postcontrast images series 11, image 10 series 13, image 20 and series 14, images 10-13. The entire collection encompasses 29 by at least 150 x 84 millimeters, and has an estimated volume of greater than 100 mL. The collection is mildly rim enhancing, but dark on all  sequences with small areas of internal T2 and STIR hyperintensity. I favor this is postoperative hematoma. Subsequently there is anterior mass effect on the trachea and esophagus, but no significant airway narrowing is demonstrated. The cervical esophagus appears severely affected. The prevertebral soft tissue mass effect continues to the thoracic inlet. Disc levels: The C2-C3 level is unchanged.  The C7-T1 level is unchanged. From C3-C4 through C6-C7 there is progressive spinal stenosis which appears related to a ventral epidural space occupying lesion tracking linearly posterior to the vertebral bodies. This measures up to 3-4 millimeters in thickness and appears maximal at the C5 and C6 levels. It has similar decreased signal on all sequences suggesting hematoma. Axial images demonstrate this new space-occupying process best on series 8 images 7and 11, and series 9, image 14. Associated increased spinal cord mass effect at these levels felt related to the new abnormal cord signal. IMPRESSION: 1. New spinal cord compression and suspected cord edema from the C3 level through the C6 level appears related to material in the ventral epidural space which is favored to be hematoma (see #2). New holo-cord edema from the C3 to the C6 cord levels, superimposed on several preexisting demyelinating spinal cord lesions. 2. Large prevertebral fluid collection tracking from C4 into the upper thoracic spine, and dissecting through the right lateral neck likely along the surgical approach. In the upper thorax the collection continues and tracks into the right lateral paraspinal space. The fluid is dark on all sequences with mild rim enhancement, and favored to be a large hematoma with estimated volume exceeding 100 mL. 3. Associated regional mass effect on the neck and thoracic inlet soft tissues. No significant airway narrowing is demonstrated. 4. Postoperative changes C5 through C7 including partial C6 corpectomy.  Electronically Signed: By: Odessa Fleming M.D. On: 04/01/2017 11:43   Dg Chest Port 1v Same Day  Result Date: 04/01/2017 CLINICAL DATA:  Shortness of breath, atrial fibrillation, diabetes, multiple scleroses. EXAM: PORTABLE CHEST 1 VIEW COMPARISON:  Chest x-ray of March 21, 2017 FINDINGS: The lungs are well-expanded. There is subtle increased interstitial density in the lower third of the right lung. The heart and pulmonary vascularity are normal. The mediastinum is normal in width with exception of soft tissue fullness in the right paratracheal region. IMPRESSION: Probable subsegmental atelectasis at the right lung base.  No CHF. Soft tissue fullness in the right paratracheal region is more conspicuous than on the previous study. This may reflect lymphadenopathy, intrathoracic border, or vascular structure. Given the patient's lower anterior cervical fusion 1 week ago, hematoma or infection may be present. Contrast-enhanced CT scanning of the neck and chest is recommended. Electronically Signed   By: David  [place] M.D.   On: 04/01/2017 08:10    Cardiac Studies   ECHO:03/30/2017 - Left ventricle: The cavity size was  normal. There was mild concentric hypertrophy. Systolic function was normal. The estimated ejection fraction was in the range of 50% to 55%. Although no diagnostic regional wall motion abnormality was identified, this possibility cannot be completely excluded on the basis of this study. Doppler parameters are consistent with abnormal left ventricular relaxation (grade 1 diastolic dysfunction). - Left atrium: The atrium was mildly dilated.   Patient Profile     52 y.o. male with a hx of DM, HTN, back pain/sciatica, severe cervical stenosis s/p anterior cervical decompression/discectomy fusion and corpectomy 03/25/17, hospital course complicated by atrial fibrillation with RVR. Cardiology following for Afib management.    Assessment & Plan    1. Atrial fibrillation  with RVR: persistent new onset atrial fibrillation with RVR. Still with HR consistently above 130s despite amiodarone gtt and po diltiazem. BP still soft limiting ability to titrate diltiazem. Possible Afib being driven by infection as patient has had fever and significant leukocytosis, and seems resistant to antiarrhythmics - CHA2DS2VASc = 2 (HTN and DM) - ultimately would benefit from anticoagulation, however risks outweigh benefits at this time - Eliquis on hold given new/worsening LE weakness and c/f post-surgical hematoma on MRI  - Continue amiodarone gtt. May need to consider additive/alternative therapies if Afib RVR continues - Continue diltiazem 30mg  q8h  2. S/P anterior cervical spine decompression, discectomy fusion, and corpectomy: patient with complaints of B/L LE weakness yesterday, thought to be worse today by primary team. Also with fever 2/27 and significant leukocytosis. C/f possible surgical site bleed vs abscess. - MRI spine with new spinal cord compression and suspected cord edema, large prevertebral fluid collection likely 2/2 hematoma, and no significant airway narrowing.  - Eliquis on hold indefinitely - Continue antibiotics and further management per primary team  3. HTN: BP soft  - Continue low dose diltiazem  4. DM type 2: Hgb A1C 8.4 - Continue management per primary team    For questions or updates, please contact [facility] Please consult [email or web address] for contact info under Cardiology/STEMI.      Signed, Beatriz Stallion, PA-C  04/02/2017, 9:19 AM   [phone]

## 2017-04-02 NOTE — Progress Notes (Signed)
Inpatient Diabetes Program Recommendations  AACE/ADA: New Consensus Statement on Inpatient Glycemic Control (2015)  Target Ranges:  Prepandial:   less than 140 mg/dL      Peak postprandial:   less than 180 mg/dL (1-2 hours)      Critically ill patients:  140 - 180 mg/dL   Lab Results  Component Value Date   GLUCAP 178 (H) 04/02/2017   HGBA1C 8.4 (H) 03/22/2017    Review of Glycemic ControlResults for NAS, [id number] A (MRN [id number]) as of 04/02/2017 11:06  Ref. Range 04/01/2017 07:26 04/01/2017 11:32 04/01/2017 16:26 04/01/2017 21:55 04/02/2017 07:30  Glucose-Capillary Latest Ref Range: 65 - 99 mg/dL 160 (H) 109 (H) 323 (H) 326 (H) 178 (H)   Diabetes history:Type 2 DM Outpatient Diabetes medicaions:Metformin 500 mg BID Current orders for Inpatient glycemic control:Novolog 0-20 units TIDAC, Lantus 35 Units QHS, Novolog 0-5 Units QHS Decadron 4 mg IV q 6 hours Inpatient Diabetes Program Recommendations:   Note that IV steroids added.  May consider increasing Novolog correction to q 4 hours while on IV Decadron and increase Lantus to 45 units q HS.   Thanks,   Beryl Meager, RN, BC-ADM Inpatient Diabetes Coordinator Pager [phone] (8a-5p)

## 2017-04-02 NOTE — Progress Notes (Addendum)
PROGRESS NOTE        PATIENT DETAILS Name: Dylan [name] Age: 52 y.o. Sex: male Date of Birth: [birth year] Admit Date: 03/21/2017 Admitting Physician Vianne Bulls, MD ZOX:[id number], No Pcp Per  Brief Narrative: Patient is a 52 y.o. male with history of DM-2, hypertension who presented with generalized weakness but mostly with progressive weakness in his right leg-patient was evaluated by neurology-and underwent workup which demonstrated cord compression at C5-C6, and C6-C7 level due to severe cervical stenosis, patient was also diagnosed with demyelinating disease (MS vs NMO).  Patient was started on IV steroids, and neurosurgery was consulted, patient underwent corpectomy at C6 with decompression on 2/21.  Further hospital course was complicated by development of atrial fibrillation with RVR.  His rate was then appropriately controlled with plans to discharge to CIR on 2/26, however patient developed lethargy and discharge was held. However on 2/27, patient was found to have A. fib with rapid ventricular response-requiring initiation of IV amiodarone.  Hospital course now has been complicated by development of fever, leukocytosis and acute kidney injury-unfortunately upon further evaluation with MRI C Spine-which showed either a hematoma or a abscess causing cord compression, subsequently re-evaluated by Neurosurgery, and thought not to be a candidate for repeat surgery given poor chance of any meaningful recovery. See below for further details.   Subjective: Fever curve much better-no change in neuro exam from yesterday.   Assessment/Plan: Cervical cord compression: initially felt to be secondary to severe spinal stenosis-underwent corpectomy and decompressive surgery on 2/21, unfortunately hospital course has been complicated by development of either a hematoma or a epidural abscess causing cord compresion-as seen on MRI C Spine on 2/28. Per Neurosurgery, not felt to be  a candidate for repeat surgery given poor chance of any meaningful recovery. Remains on empiric Vanco/Zosyn-no longer on anticoagulation. Continue Decadron- and attempts at PT. Very difficult situation-also has multiple sclerosis-per family-patient had been struggling to walk for a few years-and for the past few months had progressive decline in function.   Sepsis: likely secondary to cervical epidural abscess, complicated UTI. Remains on Vanco/Cefepime for now. Blood cx on 2/28 neg so far, urine culture-prelim E Coli-will continue with current abx for another day or so-and will touch base with ID.  A. fib with RVR: Continues to be in atrial fibrillation with RVR this morning-remains on IV amiodarone and oral cardizem-cardiology has added digoxin. No longer on anticoagulation.Echocardiogram showed EF of around 50-55%.  Chads 2 Vas score is 2 (DM, HTN).  Acute kidney injury: Likely hemodynamically mediated-in the setting of fever, A. fib RVR, and the use of ACE inhibitor-slowly improving with IVF-lisinopril remains on hold, continue IVF.  Follow electrolytes.     Newly diagnosed multiple sclerosis: Completed course of IV steroids, plans were to follow-up with [facility] neurology-Dr. Felecia Shelling.  DM 2 with hyperglycemia: CBGs on the higher side-increase Lantus to 40 units, add Novolog 6 units with meals. Continue SSI. Will follow and adjust accordingly.  Hypertension: Controlled, continue with  Cardizem.Continue with Lisinopril.   Morbid obesity  Telemetry (independently reviewed): Afib with RVR  Echo (reviewed):EF 50-55% on TTE done on 2/26  DVT Prophylaxis: Anticoagulation currently on hold  Code Status: Full code   Family Communication: None at bedside-spoke with cousin Carlyon Shadow) over the phone.  Disposition Plan: Remain inpatient-not stable for discharge-we will require several more days of hospitalization  Antimicrobial agents: Anti-infectives (From admission, onward)   Start      Dose/Rate Route Frequency Ordered Stop   04/01/17 1500  vancomycin (VANCOCIN) 1,500 mg in sodium chloride 0.9 % 500 mL IVPB     1,500 mg 250 mL/hr over 120 Minutes Intravenous Every 24 hours 04/01/17 1326     04/01/17 1400  ceFEPIme (MAXIPIME) 2 g in sodium chloride 0.9 % 100 mL IVPB     2 g 200 mL/hr over 30 Minutes Intravenous Every 12 hours 04/01/17 1326     03/25/17 1900  ceFAZolin (ANCEF) IVPB 2g/100 mL premix     2 g 200 mL/hr over 30 Minutes Intravenous Every 8 hours 03/25/17 1830 03/26/17 2227   03/25/17 1404  bacitracin 50,000 Units in sodium chloride irrigation 0.9 % 500 mL irrigation  Status:  Discontinued       As needed 03/25/17 1404 03/25/17 1551   03/25/17 1200  ceFAZolin (ANCEF) 3 g in dextrose 5 % 50 mL IVPB     3 g 130 mL/hr over 30 Minutes Intravenous 30 min pre-op 03/24/17 1535 03/25/17 1325      Procedures: 2/21>> cervical spine decompression  CONSULTS:  cardiology, neurology and Neurosurgery  Time spent: 35 minutes-Greater than 50% of this time was spent in counseling, explanation of diagnosis, planning of further management, and coordination of care.  MEDICATIONS: Scheduled Meds: . B-complex with vitamin C  1 tablet Oral Daily  . dexamethasone  4 mg Intravenous Q6H  . digoxin  0.25 mg Intravenous Q6H  . diltiazem  30 mg Oral Q8H  . insulin aspart  0-20 Units Subcutaneous TID WC  . insulin aspart  0-5 Units Subcutaneous QHS  . insulin glargine  35 Units Subcutaneous QHS  . pantoprazole  40 mg Oral Daily  . sodium chloride flush  3 mL Intravenous Q12H   Continuous Infusions: . sodium chloride 150 mL/hr at 04/02/17 0651  . amiodarone 30 mg/hr (04/02/17 0651)  . ceFEPime (MAXIPIME) IV 2 g (04/02/17 1255)  . methocarbamol (ROBAXIN)  IV    . vancomycin Stopped (04/01/17 1750)   PRN Meds:.acetaminophen **OR** acetaminophen, bisacodyl, menthol-cetylpyridinium **OR** phenol, methocarbamol **OR** methocarbamol (ROBAXIN)  IV, metoprolol tartrate, ondansetron  **OR** ondansetron (ZOFRAN) IV, senna-docusate   PHYSICAL EXAM: Vital signs: Vitals:   04/02/17 0652 04/02/17 0800 04/02/17 1232 04/02/17 1253  BP:  114/85 103/87 105/73  Pulse: (!) 128     Resp:      Temp:  98.7 F (37.1 C) (!) 97.4 F (36.3 C)   TempSrc:  Oral Oral   SpO2:  97% 98%   Weight:      Height:       Filed Weights   03/21/17 1713 03/22/17 0839 03/31/17 0457  Weight: (!) 158.8 kg (350 lb) 130.9 kg (288 lb 9.3 oz) (!) 136.7 kg (301 lb 5.9 oz)   Body mass index is 36.68 kg/m.  General appearance :Awake, alert, not in any distress.  Eyes:, pupils equally reactive to light and accomodation HEENT: Atraumatic and Normocephalic Neck: supple, no JVD. Resp:Good air entry bilaterally,no added sounds anteriorly CVS: S1 S2 regular, no murmurs.  GI: Bowel sounds present, Non tender and not distended with no gaurding, rigidity or rebound. Extremities: B/L Lower Ext shows no edema, both legs are warm to touch Neurology: Unable to lift both legs of the bed-able to lift both hands/arms of the bed. Has left wrist drop Psychiatric: Normal judgment and insight. Normal mood. Musculoskeletal:No digital cyanosis Skin:No Rash, warm and dry Wounds:N/A  I have personally reviewed following labs and imaging studies  LABORATORY DATA: CBC: Recent Labs  Lab 03/29/17 0357 03/30/17 0843 03/31/17 0753 04/01/17 0328 04/02/17 0403  WBC 14.4* 10.4 14.5* 25.7* 27.0*  NEUTROABS  --   --   --   --  22.9*  HGB 15.0 14.0 12.4* 12.2* 11.5*  HCT 43.0 40.9 35.5* 35.5* 33.3*  MCV 86.9 87.8 87.9 87.0 87.2  PLT 195 208 182 198 889    Basic Metabolic Panel: Recent Labs  Lab 03/28/17 1823 03/29/17 0357 03/31/17 0753 04/01/17 0328 04/02/17 0403  NA 137 138 136 140 144  K 3.9 4.0 4.4 4.0 4.0  CL 102 103 103 106 113*  CO2 25 25 21* 23 22  GLUCOSE 167* 144* 261* 232* 236*  BUN 24* 24* 42* 53* 49*  CREATININE 1.07 1.00 1.47* 1.79* 1.45*  CALCIUM 8.4* 8.4* 8.3* 8.5* 8.1*     GFR: Estimated Creatinine Clearance: 93.1 mL/min (A) (by C-G formula based on SCr of 1.45 mg/dL (H)).  Liver Function Tests: Recent Labs  Lab 03/31/17 0753 04/02/17 0403  AST 18 18  ALT 36 31  ALKPHOS 45 43  BILITOT 0.7 0.8  PROT 5.9* 5.7*  ALBUMIN 2.5* 2.1*   No results for input(s): LIPASE, AMYLASE in the last 168 hours. No results for input(s): AMMONIA in the last 168 hours.  Coagulation Profile: Recent Labs  Lab 03/28/17 1349  INR 1.36    Cardiac Enzymes: No results for input(s): CKTOTAL, CKMB, CKMBINDEX, TROPONINI in the last 168 hours.  BNP (last 3 results) No results for input(s): PROBNP in the last 8760 hours.  HbA1C: No results for input(s): HGBA1C in the last 72 hours.  CBG: Recent Labs  Lab 04/01/17 1132 04/01/17 1626 04/01/17 2155 04/02/17 0730 04/02/17 1144  GLUCAP 262* 252* 326* 178* 217*    Lipid Profile: No results for input(s): CHOL, HDL, LDLCALC, TRIG, CHOLHDL, LDLDIRECT in the last 72 hours.  Thyroid Function Tests: No results for input(s): TSH, T4TOTAL, FREET4, T3FREE, THYROIDAB in the last 72 hours.  Anemia Panel: No results for input(s): VITAMINB12, FOLATE, FERRITIN, TIBC, IRON, RETICCTPCT in the last 72 hours.  Urine analysis:    Component Value Date/Time   COLORURINE AMBER (A) 04/01/2017 1218   APPEARANCEUR CLOUDY (A) 04/01/2017 1218   LABSPEC 1.017 04/01/2017 1218   PHURINE 5.0 04/01/2017 1218   GLUCOSEU NEGATIVE 04/01/2017 1218   HGBUR LARGE (A) 04/01/2017 1218   BILIRUBINUR NEGATIVE 04/01/2017 1218   [place] 04/01/2017 1218   PROTEINUR 30 (A) 04/01/2017 1218   NITRITE NEGATIVE 04/01/2017 1218   LEUKOCYTESUR LARGE (A) 04/01/2017 1218    Sepsis Labs: Lactic Acid, Venous No results found for: LATICACIDVEN  MICROBIOLOGY: Recent Results (from the past 240 hour(s))  Surgical PCR screen     Status: Abnormal   Collection Time: 03/24/17 10:26 PM  Result Value Ref Range Status   MRSA, PCR NEGATIVE  NEGATIVE Final   Staphylococcus aureus POSITIVE (A) NEGATIVE Final    Comment: (NOTE) The Xpert SA Assay (FDA approved for NASAL specimens in patients 47 years of age and older), is one component of a comprehensive surveillance program. It is not intended to diagnose infection nor to guide or monitor treatment. Performed at [facility], [place] [address], [place], [place] [address]   Culture, blood (routine x 2)     Status: None (Preliminary result)   Collection Time: 04/01/17 12:30 AM  Result Value Ref Range Status   Specimen Description BLOOD RIGHT  ARM  Final   Special Requests IN PEDIATRIC BOTTLE Blood Culture adequate volume  Final   Culture   Final    NO GROWTH 1 DAY Performed at [facility], [place] [address] [place], [place] [address]    Report Status PENDING  Incomplete  Culture, blood (routine x 2)     Status: None (Preliminary result)   Collection Time: 04/01/17 12:40 AM  Result Value Ref Range Status   Specimen Description BLOOD LEFT HAND  Final   Special Requests IN PEDIATRIC BOTTLE Blood Culture adequate volume  Final   Culture   Final    NO GROWTH 1 DAY Performed at [facility], [place] [address], [place], [place] [address]    Report Status PENDING  Incomplete  Culture, Urine     Status: Abnormal (Preliminary result)   Collection Time: 04/01/17  1:11 PM  Result Value Ref Range Status   Specimen Description URINE, RANDOM  Final   Special Requests NONE  Final   Culture (A)  Final    >=100,000 COLONIES/mL ESCHERICHIA COLI SUSCEPTIBILITIES TO FOLLOW Performed at [facility], [place] [address], [place], [place] [address]    Report Status PENDING  Incomplete    RADIOLOGY STUDIES/RESULTS: Dg Chest 2 View  Result Date: 03/21/2017 CLINICAL DATA:  Right-sided weakness. EXAM: CHEST  2 VIEW COMPARISON:  None. FINDINGS: AP and lateral views of the chest were obtained. The lungs are clear without focal pneumonia, edema, pneumothorax or pleural effusion.  Minimal atelectasis noted left base. The cardiopericardial silhouette is within normal limits for size. The visualized bony structures of the thorax are intact. Telemetry leads overlie the chest. IMPRESSION: No active cardiopulmonary disease. Electronically Signed   By: Misty Stanley M.D.   On: 03/21/2017 13:08   Dg Cervical Spine 1 View  Result Date: 03/25/2017 CLINICAL DATA:  Cervical spine fusion. EXAM: DG C-ARM 61-120 MIN; DG CERVICAL SPINE - 1 VIEW COMPARISON:  03/23/2017. FINDINGS: Lower cervical anterior and interbody fusion. Hardware intact. Anatomic alignment. Surgical sponges noted over the lower anterior neck. IMPRESSION: Lower cervical anterior interbody fusion with anatomic alignment. Electronically Signed   By: Marcello Moores  Register   On: 03/25/2017 15:47   Ct Head Wo Contrast  Result Date: 03/31/2017 CLINICAL DATA:  Initial evaluation for generalized weakness, most prevalent within the right lower extremity. EXAM: CT HEAD WITHOUT CONTRAST TECHNIQUE: Contiguous axial images were obtained from the base of the skull through the vertex without intravenous contrast. COMPARISON:  Prior MRI from 03/23/2017. FINDINGS: Brain: Mildly advanced cerebral atrophy for age. Multiple scattered hypodensities seen throughout the periventricular and deep white matter both cerebral hemispheres, several of which radiate in a perpendicular fashion from the lateral ventricles, again concerning for demyelinating disease. Findings are grossly similar relative to recent MRI. No acute intracranial hemorrhage. No acute large vessel territory infarct. No mass lesion, midline shift or mass effect. No hydrocephalus. No extra-axial fluid collection. Vascular: Scalp soft tissues and calvarium within normal limits. Skull: Globes orbital soft tissues within normal limits. Paranasal sinuses and mastoid air cells are clear. Sinuses/Orbits: None. Other: None. IMPRESSION: 1. Scattered cerebral white-matter hypodensities in a distribution  most consistent with underlying demyelinating disease, grossly stable relative to recent MRI. 2. No other new intracranial abnormality identified. Electronically Signed   By: Jeannine Boga M.D.   On: 03/31/2017 19:04   Mr Jeri Cos YQ Contrast  Result Date: 03/23/2017 CLINICAL DATA:  Progressive RIGHT-sided weakness for 7 months. History of hypertension, diabetes. Follow-up  cervical spinal cord lesions. EXAM: MRI HEAD WITHOUT AND WITH CONTRAST TECHNIQUE: Multiplanar, multiecho pulse sequences of the brain and surrounding structures were obtained without and with intravenous contrast. CONTRAST:  72m MULTIHANCE GADOBENATE DIMEGLUMINE 529 MG/ML IV SOLN COMPARISON:  None. FINDINGS: INTRACRANIAL CONTENTS: Faint reduced diffusion bilateral occipital lobes, extending toward splenium of corpus callosum with normalized or increased diffusivity. At least 5 subcentimeter posterior fossa lesions. 4 mm T2 bright lesion ventral spinal cord. Far greater than 10 supratentorial white matter lesions, some of which correspond to diffusion abnormality, many of which radiate from the peri ventricular margin with low T1 signal seen with black holes of demyelination. Dominant lesion LEFT frontal periventricular white matter measuring 18 mm. Additional juxta cortical and deep white matter lesions. Two 9 mm lesions LEFT thalamus extending to LEFT hypothalamus. 4 mm lesion RIGHT basal ganglia mild parenchymal brain volume loss. No hydrocephalus. Faint marginal enhancement bilateral occipital lobe lesion (coronal 8/28 and 4/28 post contrast T1). No susceptibility artifact to suggest hemorrhage. No midline shift, mass effect. No abnormal extra-axial fluid collections or extra-axial enhancement. VASCULAR: Normal major intracranial vascular flow voids present at skull base. SKULL AND UPPER CERVICAL SPINE: No abnormal sellar expansion. No suspicious calvarial bone marrow signal. Craniocervical junction maintained. SINUSES/ORBITS: The  mastoid air-cells and included paranasal sinuses are well-aerated.T2 bright signal LEFT optic nerve intracanalicular segment suspected, no definite enhancement though limited assessment. OTHER: None. IMPRESSION: 1. At least 5 infratentorial and greater than 10 supratentorial lesions consistent with chronic demyelination, with acute to subacute enhancing lesions bilateral occipital lobes. Subcentimeter LEFT thalamus and RIGHT basal ganglia demyelinating plaques. 2. Probable LEFT optic neuritis. 3. Mild parenchymal brain volume loss for age. Electronically Signed   By: CElon AlasM.D.   On: 03/23/2017 22:20   Mr Lumbar Spine Wo Contrast  Result Date: 03/21/2017 CLINICAL DATA:  Increasing leg weakness.  Urinary incontinence. EXAM: MRI LUMBAR SPINE WITHOUT CONTRAST TECHNIQUE: Multiplanar, multisequence MR imaging of the lumbar spine was performed. No intravenous contrast was administered. COMPARISON:  None. FINDINGS: Segmentation:  Assumed standard. Alignment: Mild straightening of the lumbar lordosis. Sagittal alignment is maintained. Vertebrae: Diffusely heterogeneous marrow signal throughout the lumbar spine with multifocal areas of abnormal marrow replacement. Similar appearing marrow signal abnormality is seen in the visualized sacrum, bilateral iliac bones, and bilateral lower thoracic ribs. Conus medullaris and cauda equina: Conus extends to the L1-L2 level. Patchy abnormal signal within the visualized lower thoracic cord. Clumping of the proximal cauda equina nerve roots due to downstream stenosis. Paraspinal and other soft tissues: Negative. Disc levels: T10-T11: Right lateral and anterolateral disc osteophyte complex. No stenosis. T10-T11: Right lateral disc osteophyte complex.  No stenosis. T12-L1:  Negative. L1-L2:  Negative. L2-L3: Diffuse disc bulge with superimposed large central disc protrusion and mild left greater than right facet arthropathy resulting in severe central spinal canal  stenosis. No neuroforaminal stenosis. L3-L4: Diffuse disc bulge with superimposed central and left paracentral disc protrusion. Mild bilateral facet arthropathy. Severe central spinal canal stenosis and moderate left neuroforaminal stenosis. L4-L5: Diffuse disc bulge, slightly asymmetric to the left. Moderate bilateral facet arthropathy. Severe bilateral lateral recess stenosis. Moderate central spinal canal stenosis. Mild to moderate bilateral neuroforaminal stenosis. L5-S1: Diffuse disc bulge with superimposed central disc protrusion. Mild narrowing of the bilateral lateral recesses. Disc material abuts the descending conjoined left S1 and S2 nerve roots. Mild left and severe right neuroforaminal stenosis. IMPRESSION: 1. Diffusely heterogeneous marrow signal throughout the visualized thoracolumbar spine, pelvis, and lower thoracic ribs, concerning for  multiple myeloma or metastatic disease. 2. Patchy, abnormal signal throughout the visualized lower thoracic spinal cord, nonspecific. Recommend further evaluation with MRI of the cervical and thoracic spine with and without contrast. 3. Multilevel degenerative changes throughout the lumbar spine with severe central spinal canal stenosis at L2-L3 and L3-L4, likely accounting for the patient's symptoms. 4. Severe right neuroforaminal stenosis at L5-S1. Moderate left neuroforaminal stenosis at L3-L4. Electronically Signed   By: Titus Dubin M.D.   On: 03/21/2017 16:27   Mr Cervical Spine W Wo Contrast  Addendum Date: 04/01/2017   ADDENDUM REPORT: 04/01/2017 12:08 ADDENDUM: Critical Value/emergent results were called by telephone at the time of interpretation on 04/01/2017 at 1150 hours to Dr. Oren Binet , who verbally acknowledged these results. He advises that the patient's hemoglobin value has trended down 3 points in the past four days, arguing in favor of the abnormal fluid collection in this study representing hematoma. We discussed, however, that the  possibility of abscess is not excluded by imaging. Electronically Signed   By: Genevie Ann M.D.   On: 04/01/2017 12:08   Result Date: 04/01/2017 CLINICAL DATA:  52 year old male postoperative day 7 status post C6 corpectomy, anterior cervical spine instrumentation and ACDF spanning C5-C7 4 decompression of spinal cord and exiting nerve roots. Fever overnight, bilateral lower extremity weakness. Evidence of diffuse demyelinating disease in the brain and spine. Status post a course of steroids. History of type 2 diabetes, morbid obesity. EXAM: MRI CERVICAL SPINE WITHOUT AND WITH CONTRAST TECHNIQUE: Multiplanar and multiecho pulse sequences of the cervical spine, to include the craniocervical junction and cervicothoracic junction, were obtained without and with intravenous contrast. CONTRAST:  70m MULTIHANCE GADOBENATE DIMEGLUMINE 529 MG/ML IV SOLN COMPARISON:  Preoperative cervical spine MRI 03/21/2017. FINDINGS: Alignment: Straightening of the cervical spine. No spondylolisthesis. Vertebrae: Hardware susceptibility artifact from the C5 to the C7 level. Evidence of anterior corpectomy of C6. Underlying diffuse abnormal bone marrow signal throughout the visible spine. No marrow edema or evidence of acute osseous abnormality. Cord: New confluent abnormal T2 and STIR hyperintensity within the cervical spinal cord from the C3-C4 to the C5-C6 cord level. This has the appearance of holo cord edema on sagittal T2 series 4, image 10. There were superimposed multifocal patchy T2 and STIR hyperintense cord lesions, and 1 such lesion is redemonstrated at the C6-C7 cord level just inferior to the holo cord abnormality. Following contrast there is only faint residual intramedullary enhancement corresponding to the C6-C7 lesion. There is questionable mild increased leptomeningeal enhancement of the cord above C6-C7. The spinal cord below C7 and above the C2 body appears normal. Stable visible brainstem with small medullary and  pontine nonenhancing lesions. Posterior Fossa, vertebral arteries, paraspinal tissues: There is a large prevertebral fluid collection tracking inferiorly from the C4 level into the upper thorax, and tracking laterally to the right at the C5-C6 level probably along the surgical approach. In the upper thorax the collection then tracks posteriorly into the right lateral paraspinal soft tissues. The collection is best demonstrated on postcontrast images series 11, image 10 series 13, image 20 and series 14, images 10-13. The entire collection encompasses 29 by at least 150 x 84 millimeters, and has an estimated volume of greater than 100 mL. The collection is mildly rim enhancing, but dark on all sequences with small areas of internal T2 and STIR hyperintensity. I favor this is postoperative hematoma. Subsequently there is anterior mass effect on the trachea and esophagus, but no significant airway narrowing is demonstrated.  The cervical esophagus appears severely affected. The prevertebral soft tissue mass effect continues to the thoracic inlet. Disc levels: The C2-C3 level is unchanged.  The C7-T1 level is unchanged. From C3-C4 through C6-C7 there is progressive spinal stenosis which appears related to a ventral epidural space occupying lesion tracking linearly posterior to the vertebral bodies. This measures up to 3-4 millimeters in thickness and appears maximal at the C5 and C6 levels. It has similar decreased signal on all sequences suggesting hematoma. Axial images demonstrate this new space-occupying process best on series 8 images 7and 11, and series 9, image 14. Associated increased spinal cord mass effect at these levels felt related to the new abnormal cord signal. IMPRESSION: 1. New spinal cord compression and suspected cord edema from the C3 level through the C6 level appears related to material in the ventral epidural space which is favored to be hematoma (see #2). New holo-cord edema from the C3 to the C6  cord levels, superimposed on several preexisting demyelinating spinal cord lesions. 2. Large prevertebral fluid collection tracking from C4 into the upper thoracic spine, and dissecting through the right lateral neck likely along the surgical approach. In the upper thorax the collection continues and tracks into the right lateral paraspinal space. The fluid is dark on all sequences with mild rim enhancement, and favored to be a large hematoma with estimated volume exceeding 100 mL. 3. Associated regional mass effect on the neck and thoracic inlet soft tissues. No significant airway narrowing is demonstrated. 4. Postoperative changes C5 through C7 including partial C6 corpectomy. Electronically Signed: By: Genevie Ann M.D. On: 04/01/2017 11:43   Mr Cervical Spine W Or Wo Contrast  Result Date: 03/21/2017 CLINICAL DATA:  Bilateral lower extremity weakness. Urinary incontinence. Abnormal distal spinal cord on lumbar spine MRI today. EXAM: MRI CERVICAL AND THORACIC SPINE WITHOUT AND WITH CONTRAST TECHNIQUE: Multiplanar and multiecho pulse sequences of the cervical spine, to include the craniocervical junction and cervicothoracic junction, and thoracic spine, were obtained without and with intravenous contrast. CONTRAST:  27m MULTIHANCE GADOBENATE DIMEGLUMINE 529 MG/ML IV SOLN COMPARISON:  Lumbar spine MRI today FINDINGS: MRI CERVICAL SPINE FINDINGS The study is moderately motion degraded. Alignment: Cervical spine straightening.  No significant listhesis. Vertebrae: Diffuse heterogeneous replacement of normal marrow signal throughout the spine as described on prior lumbar MRI. No dominant enhancing lesion. Preserved vertebral body heights without evidence of fracture. Cord: There is abnormal T2 hyperintensity in the right lateral spinal cord at C3-4 extending over a length of approximately 1.5 cm. Smaller areas of abnormal T2 signal are present at the superior C6 endplate level and at the C6-7 disc space level, with  the latter demonstrating enhancement. Posterior Fossa, vertebral arteries, paraspinal tissues: Discrete 4 mm T2 hyperintense nonenhancing lesion in the medulla. Additional T2 hyperintense lesions in the pons and left cerebellum. Disc levels: The cervical spinal canal is small in caliber diffusely on a congenital basis. C2-3: Negative. C3-4: Shallow central disc protrusion without significant stenosis. C4-5: Mild disc bulging and uncovertebral spurring result in mild spinal stenosis and moderate bilateral neural foraminal stenosis. C5-6: Broad-based posterior disc osteophyte complex results in moderate spinal stenosis with mild cord flattening and moderate to severe bilateral neural foraminal stenosis. C6-7: Broad-based posterior disc osteophyte complex and central disc extrusion result in severe spinal stenosis with moderate cord flattening and severe bilateral neural foraminal stenosis. C7-T1: Mild uncovertebral spurring results in at most mild bilateral neural foraminal stenosis. No spinal stenosis. MRI THORACIC SPINE FINDINGS The study is mildly  motion degraded. Alignment: Normal. Vertebrae: Diffuse heterogeneous replacement of normal marrow signal throughout the spine and ribs as well as visualized clavicles and sternum. No dominant enhancing lesion. Preserved vertebral body heights without evidence of fracture. Cord: As seen on today's earlier lumbar MRI, there is patchy T2 hyperintensity in the distal thoracic spinal cord, with the most discrete lesion being on the left at T12. There is a small T2 hyperintense focus in the left central cord at T6-7. A small lesion versus artifact is questioned at T3. There is the suggestion of focal enhancement along the midline dorsal cord at T5 on axial images (series 21, image 21), however this is not confirmed on sagittal images. Paraspinal and other soft tissues: Unremarkable. Disc levels: Endplate spurring results in mild left neural foraminal stenosis at T1-2. A right  posterolateral/foraminal disc osteophyte at T2-3 results in mild right neural foraminal stenosis. A shallow left paracentral disc osteophyte complex and left facet hypertrophy at T6-7 result in mild spinal stenosis and mild left neural foraminal stenosis. A slightly larger left paracentral disc osteophyte complex at T7-8 combines with facet hypertrophy to result in mild spinal stenosis. There is at most mild disc bulging more inferiorly in the thoracic spine without significant stenosis. IMPRESSION: 1. Scattered T2 hyperintense lesions throughout the cervical and thoracic spinal cord as well as in the pons, medulla, and cerebellum. A lesion at C6-7 is enhancing. While some lesions are associated with spinal stenosis and could in isolation reflect spondylotic myelopathy, the presence of lesions elsewhere in the cord and posterior fossa are indicative of a more widespread process such as demyelinating disease. Infection, inflammatory conditions such as sarcoidosis, and metastatic disease are additional considerations. Brain MRI and CSF analysis are recommended. 2. 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 3. Mild spinal stenosis at T6-7 and T7-8 due to disc and facet degeneration. 4. Diffusely abnormal bone marrow signal. Considerations include multiple myeloma, metastatic disease, and other infiltrative/myelofibrotic marrow processes. Electronically Signed   By: Logan Bores M.D.   On: 03/21/2017 21:44   Mr Thoracic Spine W Wo Contrast  Result Date: 03/21/2017 CLINICAL DATA:  Bilateral lower extremity weakness. Urinary incontinence. Abnormal distal spinal cord on lumbar spine MRI today. EXAM: MRI CERVICAL AND THORACIC SPINE WITHOUT AND WITH CONTRAST TECHNIQUE: Multiplanar and multiecho pulse sequences of the cervical spine, to include the craniocervical junction and cervicothoracic junction, and thoracic spine, were obtained without and with intravenous  contrast. CONTRAST:  76m MULTIHANCE GADOBENATE DIMEGLUMINE 529 MG/ML IV SOLN COMPARISON:  Lumbar spine MRI today FINDINGS: MRI CERVICAL SPINE FINDINGS The study is moderately motion degraded. Alignment: Cervical spine straightening.  No significant listhesis. Vertebrae: Diffuse heterogeneous replacement of normal marrow signal throughout the spine as described on prior lumbar MRI. No dominant enhancing lesion. Preserved vertebral body heights without evidence of fracture. Cord: There is abnormal T2 hyperintensity in the right lateral spinal cord at C3-4 extending over a length of approximately 1.5 cm. Smaller areas of abnormal T2 signal are present at the superior C6 endplate level and at the C6-7 disc space level, with the latter demonstrating enhancement. Posterior Fossa, vertebral arteries, paraspinal tissues: Discrete 4 mm T2 hyperintense nonenhancing lesion in the medulla. Additional T2 hyperintense lesions in the pons and left cerebellum. Disc levels: The cervical spinal canal is small in caliber diffusely on a congenital basis. C2-3: Negative. C3-4: Shallow central disc protrusion without significant stenosis. C4-5: Mild disc bulging and uncovertebral spurring result in mild spinal  stenosis and moderate bilateral neural foraminal stenosis. C5-6: Broad-based posterior disc osteophyte complex results in moderate spinal stenosis with mild cord flattening and moderate to severe bilateral neural foraminal stenosis. C6-7: Broad-based posterior disc osteophyte complex and central disc extrusion result in severe spinal stenosis with moderate cord flattening and severe bilateral neural foraminal stenosis. C7-T1: Mild uncovertebral spurring results in at most mild bilateral neural foraminal stenosis. No spinal stenosis. MRI THORACIC SPINE FINDINGS The study is mildly motion degraded. Alignment: Normal. Vertebrae: Diffuse heterogeneous replacement of normal marrow signal throughout the spine and ribs as well as  visualized clavicles and sternum. No dominant enhancing lesion. Preserved vertebral body heights without evidence of fracture. Cord: As seen on today's earlier lumbar MRI, there is patchy T2 hyperintensity in the distal thoracic spinal cord, with the most discrete lesion being on the left at T12. There is a small T2 hyperintense focus in the left central cord at T6-7. A small lesion versus artifact is questioned at T3. There is the suggestion of focal enhancement along the midline dorsal cord at T5 on axial images (series 21, image 21), however this is not confirmed on sagittal images. Paraspinal and other soft tissues: Unremarkable. Disc levels: Endplate spurring results in mild left neural foraminal stenosis at T1-2. A right posterolateral/foraminal disc osteophyte at T2-3 results in mild right neural foraminal stenosis. A shallow left paracentral disc osteophyte complex and left facet hypertrophy at T6-7 result in mild spinal stenosis and mild left neural foraminal stenosis. A slightly larger left paracentral disc osteophyte complex at T7-8 combines with facet hypertrophy to result in mild spinal stenosis. There is at most mild disc bulging more inferiorly in the thoracic spine without significant stenosis. IMPRESSION: 1. Scattered T2 hyperintense lesions throughout the cervical and thoracic spinal cord as well as in the pons, medulla, and cerebellum. A lesion at C6-7 is enhancing. While some lesions are associated with spinal stenosis and could in isolation reflect spondylotic myelopathy, the presence of lesions elsewhere in the cord and posterior fossa are indicative of a more widespread process such as demyelinating disease. Infection, inflammatory conditions such as sarcoidosis, and metastatic disease are additional considerations. Brain MRI and CSF analysis are recommended. 2. 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 3. Mild spinal stenosis at T6-7  and T7-8 due to disc and facet degeneration. 4. Diffusely abnormal bone marrow signal. Considerations include multiple myeloma, metastatic disease, and other infiltrative/myelofibrotic marrow processes. Electronically Signed   By: Logan Bores M.D.   On: 03/21/2017 21:44   Dg Chest Port 1v Same Day  Result Date: 04/01/2017 CLINICAL DATA:  Shortness of breath, atrial fibrillation, diabetes, multiple scleroses. EXAM: PORTABLE CHEST 1 VIEW COMPARISON:  Chest x-ray of March 21, 2017 FINDINGS: The lungs are well-expanded. There is subtle increased interstitial density in the lower third of the right lung. The heart and pulmonary vascularity are normal. The mediastinum is normal in width with exception of soft tissue fullness in the right paratracheal region. IMPRESSION: Probable subsegmental atelectasis at the right lung base.  No CHF. Soft tissue fullness in the right paratracheal region is more conspicuous than on the previous study. This may reflect lymphadenopathy, intrathoracic border, or vascular structure. Given the patient's lower anterior cervical fusion 1 week ago, hematoma or infection may be present. Contrast-enhanced CT scanning of the neck and chest is recommended. Electronically Signed   By: David  Martinique M.D.   On: 04/01/2017 08:10   Dg C-arm 1-60 Min  Result Date: 03/25/2017 CLINICAL DATA:  Cervical spine fusion. EXAM: DG C-ARM 61-120 MIN; DG CERVICAL SPINE - 1 VIEW COMPARISON:  03/23/2017. FINDINGS: Lower cervical anterior and interbody fusion. Hardware intact. Anatomic alignment. Surgical sponges noted over the lower anterior neck. IMPRESSION: Lower cervical anterior interbody fusion with anatomic alignment. Electronically Signed   By: Marcello Moores  Register   On: 03/25/2017 15:47   Dg Fluoro Guide Lumbar Puncture  Result Date: 03/22/2017 CLINICAL DATA:  Lower extremity weakness. Spinal cord lesions on MRI. Severe multilevel lumbar stenosis. EXAM: DIAGNOSTIC LUMBAR PUNCTURE UNDER FLUOROSCOPIC  GUIDANCE FLUOROSCOPY TIME:  Fluoroscopy Time:  0 minutes, 42 seconds Radiation Exposure Index (if provided by the fluoroscopic device): 8.9 mGy Number of Acquired Spot Images: 0 PROCEDURE: I discussed the risks (including hemorrhage, infection, headache, and nerve damage, among others), benefits, and alternatives to fluoroscopically guided lumbar puncture with the patient. We specifically discussed the high technical likelihood of success of the procedure. The patient understood and elected to undergo the procedure. Prior to the procedure I reviewed of the patient's lumbar spine MRI and determined that L5-S1 was the optimum level. Standard time-out was employed. Following sterile skin prep and local anesthetic administration consisting of 1 percent lidocaine, a 20 gauge spinal needle was advanced without difficulty into the thecal sac at the at the L5-S1 level from a slightly paramedian approach. Clear CSF was returned. Opening pressure was not obtained. 15 cc of clear CSF was collected. The needle was subsequently removed and the skin cleansed and bandaged. No immediate complications were observed. IMPRESSION: 1. Technically successful fluoroscopically lumbar puncture at the L5-S1 level yielding 15 cc of clear CSF. Electronically Signed   By: Van Clines M.D.   On: 03/22/2017 17:10     LOS: 12 days   Oren Binet, MD  [facility] Pager:[phone]  If 7PM-7AM, please contact night-coverage [email or web address] Password [email or web address] 04/02/2017, 1:06 PM

## 2017-04-02 NOTE — Progress Notes (Signed)
CSW met with patient briefly at bedside. Patient lethargic, woke up from sleeping when CSW entered the room. CSW checked in regarding plans for rehab. Patient only gave short answers, and indicated he had no questions. Patient agreeable to CIR or SNF. Patient indicated he has support from his church family.  CSW to follow and support with disposition planning, pending medical readiness.   Dylan [name], [place]

## 2017-04-02 NOTE — Progress Notes (Addendum)
Patient converted to NSR. Per handoff report and MD note has been flipping back and forth from NSR to Afib. Paged cardiology and verified with MD to give Cardizem 30 mg PO and digoxin 0.25 MG IV tonight with current Vitals signs, heart rhythm and heart rate 70-80s. Will continue to monitor.

## 2017-04-03 ENCOUNTER — Inpatient Hospital Stay: Payer: Self-pay

## 2017-04-03 ENCOUNTER — Encounter (HOSPITAL_COMMUNITY): Payer: Self-pay | Admitting: Infectious Diseases

## 2017-04-03 DIAGNOSIS — R531 Weakness: Secondary | ICD-10-CM

## 2017-04-03 DIAGNOSIS — I482 Chronic atrial fibrillation: Secondary | ICD-10-CM

## 2017-04-03 DIAGNOSIS — E119 Type 2 diabetes mellitus without complications: Secondary | ICD-10-CM

## 2017-04-03 DIAGNOSIS — G35 Multiple sclerosis: Secondary | ICD-10-CM

## 2017-04-03 DIAGNOSIS — N179 Acute kidney failure, unspecified: Secondary | ICD-10-CM

## 2017-04-03 DIAGNOSIS — R601 Generalized edema: Secondary | ICD-10-CM

## 2017-04-03 DIAGNOSIS — Z794 Long term (current) use of insulin: Secondary | ICD-10-CM

## 2017-04-03 DIAGNOSIS — L8990 Pressure ulcer of unspecified site, unspecified stage: Secondary | ICD-10-CM | POA: Clinically undetermined

## 2017-04-03 DIAGNOSIS — M9689 Other intraoperative and postprocedural complications and disorders of the musculoskeletal system: Secondary | ICD-10-CM

## 2017-04-03 DIAGNOSIS — I10 Essential (primary) hypertension: Secondary | ICD-10-CM

## 2017-04-03 LAB — CBC
HCT: 31.9 % — ABNORMAL LOW (ref 39.0–52.0)
HEMOGLOBIN: 11.1 g/dL — AB (ref 13.0–17.0)
MCH: 30.7 pg (ref 26.0–34.0)
MCHC: 34.8 g/dL (ref 30.0–36.0)
MCV: 88.1 fL (ref 78.0–100.0)
Platelets: 229 10*3/uL (ref 150–400)
RBC: 3.62 MIL/uL — AB (ref 4.22–5.81)
RDW: 13.9 % (ref 11.5–15.5)
WBC: 19.1 10*3/uL — ABNORMAL HIGH (ref 4.0–10.5)

## 2017-04-03 LAB — BASIC METABOLIC PANEL
Anion gap: 8 (ref 5–15)
BUN: 46 mg/dL — AB (ref 6–20)
CHLORIDE: 114 mmol/L — AB (ref 101–111)
CO2: 21 mmol/L — ABNORMAL LOW (ref 22–32)
CREATININE: 1.36 mg/dL — AB (ref 0.61–1.24)
Calcium: 8 mg/dL — ABNORMAL LOW (ref 8.9–10.3)
GFR, EST NON AFRICAN AMERICAN: 60 mL/min — AB (ref >60)
Glucose, Bld: 242 mg/dL — ABNORMAL HIGH (ref 65–99)
POTASSIUM: 4.5 mmol/L (ref 3.5–5.1)
SODIUM: 143 mmol/L (ref 135–145)

## 2017-04-03 LAB — GLUCOSE, CAPILLARY
GLUCOSE-CAPILLARY: 242 mg/dL — AB (ref 65–99)
GLUCOSE-CAPILLARY: 331 mg/dL — AB (ref 65–99)
Glucose-Capillary: 282 mg/dL — ABNORMAL HIGH (ref 65–99)
Glucose-Capillary: 297 mg/dL — ABNORMAL HIGH (ref 65–99)

## 2017-04-03 MED ORDER — SODIUM CHLORIDE 0.9% FLUSH
10.0000 mL | Freq: Two times a day (BID) | INTRAVENOUS | Status: DC
  Administered 2017-04-03: 3 mL
  Administered 2017-04-04 – 2017-04-09 (×7): 10 mL

## 2017-04-03 MED ORDER — VANCOMYCIN HCL IN DEXTROSE 1-5 GM/200ML-% IV SOLN
1000.0000 mg | Freq: Two times a day (BID) | INTRAVENOUS | Status: DC
  Administered 2017-04-04 – 2017-04-05 (×4): 1000 mg via INTRAVENOUS
  Filled 2017-04-03 (×6): qty 200

## 2017-04-03 MED ORDER — INSULIN GLARGINE 100 UNIT/ML ~~LOC~~ SOLN
48.0000 [IU] | Freq: Every day | SUBCUTANEOUS | Status: DC
  Administered 2017-04-03 – 2017-04-04 (×2): 48 [IU] via SUBCUTANEOUS
  Filled 2017-04-03 (×4): qty 0.48

## 2017-04-03 MED ORDER — BENZONATATE 100 MG PO CAPS
200.0000 mg | ORAL_CAPSULE | Freq: Three times a day (TID) | ORAL | Status: DC | PRN
Start: 2017-04-03 — End: 2017-04-09

## 2017-04-03 MED ORDER — SODIUM CHLORIDE 0.9% FLUSH: 10.0000 mL | INTRAVENOUS | Status: DC | PRN

## 2017-04-03 NOTE — Consult Note (Signed)
[facility] for Infectious Disease    Date of Admission:  03/21/2017   Total days of antibiotics: 2 vanco/cefepime               Reason for Consult: post-op cervical abscess vs hematoma   Referring Provider: Ghimire   Assessment: Post op cervical abscess vs h      ematoma DM2 UCx E coli (R- amp, I- unasyn), Enterobacter (pending) AKI MS- new dx  Plan: 1. Vanco/cefepime 2. F/u imaging 3. BCx are epnding 4. Follow Cr on vanco 5. Follow WBC as decadron tapers off. 6. Rehab is eval-ing pt   7. place [facility]  Unfortunately he is not having more procedures (I & D) and IR will not drain areas in the neck. He is stuck with empiricism and long term anbx.   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 B-complex with vitamin C  1 tablet Oral Daily  . dexamethasone  4 mg Intravenous Q6H  . diltiazem  30 mg Oral Q8H  . insulin aspart  0-20 Units Subcutaneous TID WC  . insulin aspart  0-5 Units Subcutaneous QHS  . insulin aspart  6 Units Subcutaneous TID WC  . insulin glargine  48 Units Subcutaneous QHS  . pantoprazole  40 mg Oral Daily  . sodium chloride flush  3 mL Intravenous Q12H    HPI: Dylan [name] is a 52 y.o. male with hx of DM2, HTN, multiple sclerosis, adm on 2-17 with worsening weakness. He was found tohave C5-7 cord compression. He underwent C6 decompression on 2-21. He then developed fever and increased WBC and was started on vanco/zosyn. A f/u MRI (2-28) of his spine showed hematoma or abscess at his previous surgical site.  His course has also  been complicated by the development of afib with RVR (on cardizem/amio. No anticoagulation).   The past medical history, family history and social history were reviewed/updated in EPIC   Review of Systems: Review of Systems  Constitutional: Negative for chills, fever and weight loss.  Respiratory: Negative for cough.   Gastrointestinal: Negative for abdominal pain, diarrhea, nausea and vomiting.  Neurological: Positive for focal weakness and weakness.    Past Medical History:  Diagnosis Date  . 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 Diabetes mellitus without complication ([place])   . Hypertension   . Persistent atrial fibrillation with rapid ventricular response ([place]) 03/27/2017    Social History   Tobacco Use  . Smoking status: Never Smoker  . Smokeless tobacco: Never Used  Substance Use Topics  . Alcohol use: No    Frequency: Never  . Drug use: No    Family History  Problem Relation Age of Onset  . Prostate cancer Father      Medications:  Scheduled: . B-complex with vitamin C  1 tablet Oral Daily  . dexamethasone  4 mg Intravenous Q6H  . diltiazem  30 mg Oral Q8H  . insulin aspart  0-20 Units Subcutaneous TID WC  . insulin aspart  0-5 Units Subcutaneous QHS  . insulin  aspart  6 Units Subcutaneous TID WC  . insulin glargine  48 Units Subcutaneous QHS  . pantoprazole  40 mg Oral Daily  . sodium chloride flush  3 mL Intravenous Q12H    Abtx:  Anti-infectives (From admission, onward)   Start     Dose/Rate Route Frequency Ordered Stop   04/01/17 1500  vancomycin (VANCOCIN) 1,500 mg in sodium chloride 0.9 % 500 mL IVPB     1,500 mg 250 mL/hr over 120 Minutes Intravenous Every 24 hours 04/01/17 1326     04/01/17 1400  ceFEPIme (MAXIPIME) 2 g in sodium chloride 0.9 % 100 mL IVPB     2 g 200 mL/hr over 30 Minutes Intravenous Every 12 hours 04/01/17 1326     03/25/17 1900  ceFAZolin (ANCEF) IVPB 2g/100 mL premix     2 g 200 mL/hr over 30 Minutes Intravenous Every 8 hours  03/25/17 1830 03/26/17 2227   03/25/17 1404  bacitracin 50,000 Units in sodium chloride irrigation 0.9 % 500 mL irrigation  Status:  Discontinued       As needed 03/25/17 1404 03/25/17 1551   03/25/17 1200  ceFAZolin (ANCEF) 3 g in dextrose 5 % 50 mL IVPB     3 g 130 mL/hr over 30 Minutes Intravenous 30 min pre-op 03/24/17 1535 03/25/17 1325        OBJECTIVE: Blood pressure 139/89, pulse 79, temperature (!) 97.5 F (36.4 C), temperature source Oral, resp. rate 16, height _0  (1.93 m), weight 132.4 kg (291 lb 14.2 oz), SpO2 100 %.  Physical Exam  Constitutional: He is well-developed, well-nourished, and in no distress. No distress.  HENT:  Mouth/Throat: No oropharyngeal exudate.  Eyes: EOM are normal. Pupils are equal, round, and reactive to light.  Neck: Neck supple.    Cardiovascular: Normal rate, regular rhythm and normal heart sounds.  Pulmonary/Chest: Effort normal and breath sounds normal.  Abdominal: Soft. Bowel sounds are normal. There is no tenderness. There is no rebound.  Musculoskeletal: He exhibits edema.  Lymphadenopathy:    He has no cervical adenopathy.  Neurological:  Gross touch normal.   Skin: He is not diaphoretic.  Psychiatric: Mood and affect normal.    Lab Results Results for orders placed or performed during the hospital encounter of 03/21/17 (from the past 48 hour(s))  Culture, Urine     Status: Abnormal (Preliminary result)   Collection Time: 04/01/17  1:11 PM  Result Value Ref Range   Specimen Description URINE, RANDOM    Special Requests NONE    Culture (A)     >=100,000 COLONIES/mL ESCHERICHIA COLI 50,000 COLONIES/mL ENTEROBACTER SPECIES SUSCEPTIBILITIES TO FOLLOW Performed at [facility] [address] [place], [place] [address]    Report Status PENDING    Organism ID, Bacteria ESCHERICHIA COLI (A)       Susceptibility   Escherichia coli - MIC*    AMPICILLIN >=32 RESISTANT Resistant     CEFAZOLIN 16 SENSITIVE Sensitive      CEFTRIAXONE <=1 SENSITIVE Sensitive     CIPROFLOXACIN <=0.25 SENSITIVE Sensitive     GENTAMICIN <=1 SENSITIVE Sensitive     IMIPENEM <=0.25 SENSITIVE Sensitive     NITROFURANTOIN <=16 SENSITIVE Sensitive     TRIMETH/SULFA <=20 SENSITIVE Sensitive     AMPICILLIN/SULBACTAM 16 INTERMEDIATE Intermediate     PIP/TAZO <=4 SENSITIVE Sensitive     Extended ESBL NEGATIVE Sensitive     * >=100,000 COLONIES/mL ESCHERICHIA COLI  Glucose, capillary     Status: Abnormal  Collection Time: 04/01/17  4:26 PM  Result Value Ref Range   Glucose-Capillary 252 (H) 65 - 99 mg/dL  Glucose, capillary     Status: Abnormal   Collection Time: 04/01/17  9:55 PM  Result Value Ref Range   Glucose-Capillary 326 (H) 65 - 99 mg/dL  CBC with Differential/Platelet     Status: Abnormal   Collection Time: 04/02/17  4:03 AM  Result Value Ref Range   WBC 27.0 (H) 4.0 - 10.5 K/uL   RBC 3.82 (L) 4.22 - 5.81 MIL/uL   Hemoglobin 11.5 (L) 13.0 - 17.0 g/dL   HCT 33.3 (L) 39.0 - 52.0 %   MCV 87.2 78.0 - 100.0 fL   MCH 30.1 26.0 - 34.0 pg   MCHC 34.5 30.0 - 36.0 g/dL   RDW 13.5 11.5 - 15.5 %   Platelets 216 150 - 400 K/uL   Neutrophils Relative % 85 %   Lymphocytes Relative 7 %   Monocytes Relative 8 %   Eosinophils Relative 0 %   Basophils Relative 0 %   Neutro Abs 22.9 (H) 1.7 - 7.7 K/uL   Lymphs Abs 1.9 0.7 - 4.0 K/uL   Monocytes Absolute 2.2 (H) 0.1 - 1.0 K/uL   Eosinophils Absolute 0.0 0.0 - 0.7 K/uL   Basophils Absolute 0.0 0.0 - 0.1 K/uL   Smear Review MORPHOLOGY UNREMARKABLE     Comment: Performed at [facility], [place] [address] [place], [place] [address]  Comprehensive metabolic panel     Status: Abnormal   Collection Time: 04/02/17  4:03 AM  Result Value Ref Range   Sodium 144 135 - 145 mmol/L   Potassium 4.0 3.5 - 5.1 mmol/L   Chloride 113 (H) 101 - 111 mmol/L   CO2 22 22 - 32 mmol/L   Glucose, Bld 236 (H) 65 - 99 mg/dL   BUN 49 (H) 6 - 20 mg/dL   Creatinine, Ser 1.45 (H) 0.61 - 1.24 mg/dL    Calcium 8.1 (L) 8.9 - 10.3 mg/dL   Total Protein 5.7 (L) 6.5 - 8.1 g/dL   Albumin 2.1 (L) 3.5 - 5.0 g/dL   AST 18 15 - 41 U/L   ALT 31 17 - 63 U/L   Alkaline Phosphatase 43 38 - 126 U/L   Total Bilirubin 0.8 0.3 - 1.2 mg/dL   GFR calc non Af Amer 55 (L) >60 mL/min   GFR calc Af Amer >60 >60 mL/min    Comment: (NOTE) The eGFR has been calculated using the CKD EPI equation. This calculation has not been validated in all clinical situations. eGFR's persistently <60 mL/min signify possible Chronic Kidney Disease.    Anion gap 9 5 - 15    Comment: Performed at [facility] [address], [place], [place] [address]  Glucose, capillary     Status: Abnormal   Collection Time: 04/02/17  7:30 AM  Result Value Ref Range   Glucose-Capillary 178 (H) 65 - 99 mg/dL  Glucose, capillary     Status: Abnormal   Collection Time: 04/02/17 11:44 AM  Result Value Ref Range   Glucose-Capillary 217 (H) 65 - 99 mg/dL  Glucose, capillary     Status: Abnormal   Collection Time: 04/02/17  4:34 PM  Result Value Ref Range   Glucose-Capillary 234 (H) 65 - 99 mg/dL  Glucose, capillary     Status: Abnormal   Collection Time: 04/02/17  8:44 PM  Result Value Ref Range   Glucose-Capillary 260 (H) 65 -  99 mg/dL  CBC     Status: Abnormal   Collection Time: 04/03/17  2:33 AM  Result Value Ref Range   WBC 19.1 (H) 4.0 - 10.5 K/uL   RBC 3.62 (L) 4.22 - 5.81 MIL/uL   Hemoglobin 11.1 (L) 13.0 - 17.0 g/dL   HCT 31.9 (L) 39.0 - 52.0 %   MCV 88.1 78.0 - 100.0 fL   MCH 30.7 26.0 - 34.0 pg   MCHC 34.8 30.0 - 36.0 g/dL   RDW 13.9 11.5 - 15.5 %   Platelets 229 150 - 400 K/uL    Comment: Performed at [facility], [place] [address], [place], [place] [address]  Basic metabolic panel     Status: Abnormal   Collection Time: 04/03/17  2:33 AM  Result Value Ref Range   Sodium 143 135 - 145 mmol/L   Potassium 4.5 3.5 - 5.1 mmol/L   Chloride 114 (H) 101 - 111 mmol/L   CO2 21 (L) 22 - 32 mmol/L   Glucose, Bld  242 (H) 65 - 99 mg/dL   BUN 46 (H) 6 - 20 mg/dL   Creatinine, Ser 1.36 (H) 0.61 - 1.24 mg/dL   Calcium 8.0 (L) 8.9 - 10.3 mg/dL   GFR calc non Af Amer 60 (L) >60 mL/min   GFR calc Af Amer >60 >60 mL/min    Comment: (NOTE) The eGFR has been calculated using the CKD EPI equation. This calculation has not been validated in all clinical situations. eGFR's persistently <60 mL/min signify possible Chronic Kidney Disease.    Anion gap 8 5 - 15    Comment: Performed at [facility] [address], [place], [place] [address]  Glucose, capillary     Status: Abnormal   Collection Time: 04/03/17  7:51 AM  Result Value Ref Range   Glucose-Capillary 242 (H) 65 - 99 mg/dL  Glucose, capillary     Status: Abnormal   Collection Time: 04/03/17 11:35 AM  Result Value Ref Range   Glucose-Capillary 331 (H) 65 - 99 mg/dL      Component Value Date/Time   SDES URINE, RANDOM 04/01/2017 1311   SPECREQUEST NONE 04/01/2017 1311   CULT (A) 04/01/2017 1311    >=100,000 COLONIES/mL ESCHERICHIA COLI 50,000 COLONIES/mL ENTEROBACTER SPECIES SUSCEPTIBILITIES TO FOLLOW Performed at [facility], [place] [address] [place], [place] [address]    REPTSTATUS PENDING 04/01/2017 1311   No results found. Recent Results (from the past 240 hour(s))  Surgical PCR screen     Status: Abnormal   Collection Time: 03/24/17 10:26 PM  Result Value Ref Range Status   MRSA, PCR NEGATIVE NEGATIVE Final   Staphylococcus aureus POSITIVE (A) NEGATIVE Final    Comment: (NOTE) The Xpert SA Assay (FDA approved for NASAL specimens in patients 78 years of age and older), is one component of a comprehensive surveillance program. It is not intended to diagnose infection nor to guide or monitor treatment. Performed at [facility], [place] [address] [place], [place] [address]   Culture, blood (routine x 2)     Status: None (Preliminary result)   Collection Time: 04/01/17 12:30 AM  Result Value Ref Range Status    Specimen Description BLOOD RIGHT ARM  Final   Special Requests IN PEDIATRIC BOTTLE Blood Culture adequate volume  Final   Culture   Final    NO GROWTH 1 DAY Performed at [facility], [place] [address], [place], [place] [address]  Report Status PENDING  Incomplete  Culture, blood (routine x 2)     Status: None (Preliminary result)   Collection Time: 04/01/17 12:40 AM  Result Value Ref Range Status   Specimen Description BLOOD LEFT HAND  Final   Special Requests IN PEDIATRIC BOTTLE Blood Culture adequate volume  Final   Culture   Final    NO GROWTH 1 DAY Performed at [facility], [place] [address] [place], [place] [address]    Report Status PENDING  Incomplete  Culture, Urine     Status: Abnormal (Preliminary result)   Collection Time: 04/01/17  1:11 PM  Result Value Ref Range Status   Specimen Description URINE, RANDOM  Final   Special Requests NONE  Final   Culture (A)  Final    >=100,000 COLONIES/mL ESCHERICHIA COLI 50,000 COLONIES/mL ENTEROBACTER SPECIES SUSCEPTIBILITIES TO FOLLOW Performed at [facility], [place] [address] [place], [place] [address]    Report Status PENDING  Incomplete   Organism ID, Bacteria ESCHERICHIA COLI (A)  Final      Susceptibility   Escherichia coli - MIC*    AMPICILLIN >=32 RESISTANT Resistant     CEFAZOLIN 16 SENSITIVE Sensitive     CEFTRIAXONE <=1 SENSITIVE Sensitive     CIPROFLOXACIN <=0.25 SENSITIVE Sensitive     GENTAMICIN <=1 SENSITIVE Sensitive     IMIPENEM <=0.25 SENSITIVE Sensitive     NITROFURANTOIN <=16 SENSITIVE Sensitive     TRIMETH/SULFA <=20 SENSITIVE Sensitive     AMPICILLIN/SULBACTAM 16 INTERMEDIATE Intermediate     PIP/TAZO <=4 SENSITIVE Sensitive     Extended ESBL NEGATIVE Sensitive     * >=100,000 COLONIES/mL ESCHERICHIA COLI    Microbiology: Recent Results (from the past 240 hour(s))  Surgical PCR screen     Status: Abnormal   Collection Time: 03/24/17 10:26 PM  Result Value Ref Range Status   MRSA,  PCR NEGATIVE NEGATIVE Final   Staphylococcus aureus POSITIVE (A) NEGATIVE Final    Comment: (NOTE) The Xpert SA Assay (FDA approved for NASAL specimens in patients 55 years of age and older), is one component of a comprehensive surveillance program. It is not intended to diagnose infection nor to guide or monitor treatment. Performed at [facility], [place] [address], [place], [place] [address]   Culture, blood (routine x 2)     Status: None (Preliminary result)   Collection Time: 04/01/17 12:30 AM  Result Value Ref Range Status   Specimen Description BLOOD RIGHT ARM  Final   Special Requests IN PEDIATRIC BOTTLE Blood Culture adequate volume  Final   Culture   Final    NO GROWTH 1 DAY Performed at [facility], [place] [address], [place], [place] [address]    Report Status PENDING  Incomplete  Culture, blood (routine x 2)     Status: None (Preliminary result)   Collection Time: 04/01/17 12:40 AM  Result Value Ref Range Status   Specimen Description BLOOD LEFT HAND  Final   Special Requests IN PEDIATRIC BOTTLE Blood Culture adequate volume  Final   Culture   Final    NO GROWTH 1 DAY Performed at [facility], [place] [address], [place], [place] [address]    Report Status PENDING  Incomplete  Culture, Urine     Status: Abnormal (Preliminary result)   Collection Time: 04/01/17  1:11 PM  Result Value Ref Range Status   Specimen Description URINE, RANDOM  Final   Special Requests NONE  Final  Culture (A)  Final    >=100,000 COLONIES/mL ESCHERICHIA COLI 50,000 COLONIES/mL ENTEROBACTER SPECIES SUSCEPTIBILITIES TO FOLLOW Performed at [facility] [address] [place], [place] [address]    Report Status PENDING  Incomplete   Organism ID, Bacteria ESCHERICHIA COLI (A)  Final      Susceptibility   Escherichia coli - MIC*    AMPICILLIN >=32 RESISTANT Resistant     CEFAZOLIN 16 SENSITIVE Sensitive     CEFTRIAXONE <=1 SENSITIVE Sensitive     CIPROFLOXACIN <=0.25  SENSITIVE Sensitive     GENTAMICIN <=1 SENSITIVE Sensitive     IMIPENEM <=0.25 SENSITIVE Sensitive     NITROFURANTOIN <=16 SENSITIVE Sensitive     TRIMETH/SULFA <=20 SENSITIVE Sensitive     AMPICILLIN/SULBACTAM 16 INTERMEDIATE Intermediate     PIP/TAZO <=4 SENSITIVE Sensitive     Extended ESBL NEGATIVE Sensitive     * >=100,000 COLONIES/mL ESCHERICHIA COLI    Radiographs and labs were personally reviewed by me.   Bobby Rumpf, MD [facility] [place] [facility] [phone] 04/03/2017, 12:40 PM

## 2017-04-03 NOTE — Progress Notes (Signed)
Peripherally Inserted Central Catheter/Midline Placement  The IV Nurse has discussed with the patient and/or persons authorized to consent for the patient, the purpose of this procedure and the potential benefits and risks involved with this procedure.  The benefits include less needle sticks, lab draws from the catheter, and the patient may be discharged home with the catheter. Risks include, but not limited to, infection, bleeding, blood clot (thrombus formation), and puncture of an artery; nerve damage and irregular heartbeat and possibility to perform a PICC exchange if needed/ordered by physician.  Alternatives to this procedure were also discussed.  Bard Power PICC patient education guide, fact sheet on infection prevention and patient information card has been provided to patient /or left at bedside.  Patient gave verbal consent, consent signed by cousin due to right arm/hand weakness.  PICC/Midline Placement Documentation  PICC Single Lumen 04/03/17 PICC Right Cephalic 44 cm 0 cm (Active)  Indication for Insertion or Continuance of Line Home intravenous therapies (PICC only) 04/03/2017  4:55 PM  Exposed Catheter (cm) 0 cm 04/03/2017  4:55 PM  Site Assessment Clean;Dry;Intact 04/03/2017  4:55 PM  Line Status Flushed;Saline locked;Blood return noted 04/03/2017  4:55 PM  Dressing Type Transparent 04/03/2017  4:55 PM  Dressing Status Clean;Dry;Intact;Antimicrobial disc in place 04/03/2017  4:55 PM  Dressing Change Due 04/10/17 04/03/2017  4:55 PM       Shlome Baldree, Lajean Manes 04/03/2017, 4:55 PM

## 2017-04-03 NOTE — Progress Notes (Signed)
Subjective:  No cardiaccomplaints today.  Still complains of weakness.  Objective:  Vital Signs in the last 24 hours: BP 139/89 (BP Location: Right Arm)   Pulse 79   Temp (!) 97.5 F (36.4 C) (Oral)   Resp 16   Ht 6\' 4"  (1.93 m)   Wt 132.4 kg (291 lb 14.2 oz)   SpO2 100%   BMI 35.53 kg/m   Physical Exam: obese black male in no acute distress Lungs:  Clear  Cardiac:  Regular rhythm, normal S1 and S2, no S3 Extremities:  No edema present  Intake/Output from previous day: 03/01 0701 - 03/02 0700 In: 5738.4 [P.O.:640; I.V.:4398.4; IV Piggyback:700] Out: 3476 [Urine:3475; Stool:1] Weight Filed Weights   03/22/17 0839 03/31/17 0457 04/03/17 0434  Weight: 130.9 kg (288 lb 9.3 oz) (!) 136.7 kg (301 lb 5.9 oz) 132.4 kg (291 lb 14.2 oz)    Lab Results: Basic Metabolic Panel: Recent Labs    04/02/17 0403 04/03/17 0233  NA 144 143  K 4.0 4.5  CL 113* 114*  CO2 22 21*  GLUCOSE 236* 242*  BUN 49* 46*  CREATININE 1.45* 1.36*    CBC: Recent Labs    04/02/17 0403 04/03/17 0233  WBC 27.0* 19.1*  NEUTROABS 22.9*  --   HGB 11.5* 11.1*  HCT 33.3* 31.9*  MCV 87.2 88.1  PLT 216 229   Telemetry: Currently sinus rhythm, had atrial flutter yesterday  Assessment/Plan:  1.  Paroxysmal atrial fibrillation and flutter 2.  Hypertension 3.  Diabetes  Recommendations:  Changed to oral amiodarone today.  At this point  not a good candidate for anticoagulation in light of spinal hematoma.    Darden Palmer  MD [facility]  04/03/2017, 1:07 PM

## 2017-04-03 NOTE — Progress Notes (Signed)
PROGRESS NOTE        PATIENT DETAILS Name: Dylan [name] Age: 52 y.o. Sex: male Date of Birth: [birth year] Admit Date: 03/21/2017 Admitting Physician Vianne Bulls, MD [id number], No Pcp Per  Brief Narrative: Patient is a 52 y.o. male with history of DM-2, hypertension who presented with generalized weakness but mostly with progressive weakness in his right leg-patient was evaluated by neurology-and underwent workup which demonstrated cord compression at C5-C6, and C6-C7 level due to severe cervical stenosis, patient was also diagnosed with demyelinating disease (MS vs NMO).  Patient was started on IV steroids, and neurosurgery was consulted, patient underwent corpectomy at C6 with decompression on 2/21.  Further hospital course was complicated by development of atrial fibrillation with RVR.  His rate was then appropriately controlled with plans to discharge to CIR on 2/26, however patient developed lethargy and discharge was held. However on 2/27, patient was found to have A. fib with rapid ventricular response-requiring initiation of IV amiodarone.  Hospital course now has been complicated by development of fever, leukocytosis and acute kidney injury-unfortunately upon further evaluation with MRI C Spine-which showed either a hematoma or a abscess causing cord compression, subsequently re-evaluated by Neurosurgery, and thought not to be a candidate for repeat surgery given poor chance of any meaningful recovery. See below for further details.   Subjective: Claims that he feels much better-no fever for close to 48 hours. Thinks he had some minimal movement (side to side) in his leg.  Assessment/Plan: Cervical cord compression: initially felt to be secondary to severe spinal stenosis-underwent corpectomy and decompressive surgery on 2/21, unfortunately hospital course has been complicated by development of either a hematoma or a epidural abscess causing cord  compresion-as seen on MRI C Spine on 2/28. Per Neurosurgery, not felt to be a candidate for repeat surgery given poor chance of any meaningful recovery.  Neuro exam remains unchanged-he has significant weakness in his lower extremities.  Remains on empiric Vanco/Zosyn-no longer on anticoagulation. Continue Decadron-we will taper over the next few days, continue attempts at PT. Very difficult situation-also has multiple sclerosis-per family-patient had been struggling to walk for a few years (walking with a cane with progressive right>> left leg weakness)-and for the past few months had progressive decline in function-and subsequently presented to the ED when he was unable to get out of bed..  Sepsis: Sepsis pathophysiology has essentially resolved.  Likely secondary to a cervical epidural abscess and complicated UTI.  Remains on empiric vancomycin and cefepime.  Blood cultures negative so far, urine cultures shows E. coli.  Have consulted infectious disease to determine long-term antimicrobial regimen.   A. fib with RVR: Appears to be in sinus rhythm this morning-remains on amiodarone infusion, oral Cardizem-loaded with digoxin yesterday.  Will await cardiology input regarding conversion to oral amiodarone.  No longer an anticoagulation given above-noted issues.  Echocardiogram showed EF of around 50-55%.  Chads 2 Vas score is 2 (DM, HTN).  Acute kidney injury: Likely hemodynamically mediated-in the setting of fever, A. fib RVR, and the use of ACE inhibitor-improved with IV fluids-lisinopril and other nephrotoxic agents remains on hold.  Decrease IV fluids, follow electrolytes.   Newly diagnosed multiple sclerosis: Completed course of IV steroids, plans were to follow-up with [facility] neurology-Dr. Felecia Shelling.  DM 2 with hyperglycemia: CBGs on the higher side-as patient on Decadron-increase Lantus to 48 units,  continue 6 units of NovoLog with meals.  Continue SSI as well.  Will follow and adjust accordingly.    Hypertension: Controlled, continue with  Cardizem.  Morbid obesity  Telemetry (independently reviewed): Afib with RVR  Echo (reviewed):EF 50-55% on TTE done on 2/26  DVT Prophylaxis: Anticoagulation currently on hold  Code Status: Full code   Family Communication: None at bedside-spoke with cousin Carlyon Shadow) over the phone on 3/2.  Disposition Plan: Remain inpatient-hopefully to CIR early next week  Antimicrobial agents: Anti-infectives (From admission, onward)   Start     Dose/Rate Route Frequency Ordered Stop   04/01/17 1500  vancomycin (VANCOCIN) 1,500 mg in sodium chloride 0.9 % 500 mL IVPB     1,500 mg 250 mL/hr over 120 Minutes Intravenous Every 24 hours 04/01/17 1326     04/01/17 1400  ceFEPIme (MAXIPIME) 2 g in sodium chloride 0.9 % 100 mL IVPB     2 g 200 mL/hr over 30 Minutes Intravenous Every 12 hours 04/01/17 1326     03/25/17 1900  ceFAZolin (ANCEF) IVPB 2g/100 mL premix     2 g 200 mL/hr over 30 Minutes Intravenous Every 8 hours 03/25/17 1830 03/26/17 2227   03/25/17 1404  bacitracin 50,000 Units in sodium chloride irrigation 0.9 % 500 mL irrigation  Status:  Discontinued       As needed 03/25/17 1404 03/25/17 1551   03/25/17 1200  ceFAZolin (ANCEF) 3 g in dextrose 5 % 50 mL IVPB     3 g 130 mL/hr over 30 Minutes Intravenous 30 min pre-op 03/24/17 1535 03/25/17 1325      Procedures: 2/21>> cervical spine decompression  CONSULTS:  cardiology, neurology and Neurosurgery  Time spent: 25  minutes-Greater than 50% of this time was spent in counseling, explanation of diagnosis, planning of further management, and coordination of care.  MEDICATIONS: Scheduled Meds: . B-complex with vitamin C  1 tablet Oral Daily  . dexamethasone  4 mg Intravenous Q6H  . diltiazem  30 mg Oral Q8H  . insulin aspart  0-20 Units Subcutaneous TID WC  . insulin aspart  0-5 Units Subcutaneous QHS  . insulin aspart  6 Units Subcutaneous TID WC  . insulin glargine  40 Units  Subcutaneous QHS  . pantoprazole  40 mg Oral Daily  . sodium chloride flush  3 mL Intravenous Q12H   Continuous Infusions: . sodium chloride 75 mL/hr at 04/03/17 0837  . amiodarone 30 mg/hr (04/03/17 0501)  . ceFEPime (MAXIPIME) IV Stopped (04/03/17 0225)  . methocarbamol (ROBAXIN)  IV    . vancomycin Stopped (04/02/17 1657)   PRN Meds:.acetaminophen **OR** acetaminophen, benzonatate, bisacodyl, menthol-cetylpyridinium **OR** phenol, methocarbamol **OR** methocarbamol (ROBAXIN)  IV, metoprolol tartrate, ondansetron **OR** ondansetron (ZOFRAN) IV, senna-docusate   PHYSICAL EXAM: Vital signs: Vitals:   04/03/17 0015 04/03/17 0434 04/03/17 0839 04/03/17 1205  BP: 113/74 121/74 125/74 139/89  Pulse: 75 79    Resp: 18 16    Temp: 98.1 F (36.7 C) 98.6 F (37 C) 98.5 F (36.9 C) (!) 97.5 F (36.4 C)  TempSrc: Axillary Axillary Axillary Oral  SpO2: 98% 99% 100% 100%  Weight:  132.4 kg (291 lb 14.2 oz)    Height:       Filed Weights   03/22/17 0839 03/31/17 0457 04/03/17 0434  Weight: 130.9 kg (288 lb 9.3 oz) (!) 136.7 kg (301 lb 5.9 oz) 132.4 kg (291 lb 14.2 oz)   Body mass index is 35.53 kg/m.  General appearance :Awake, alert, not in any  distress.  Eyes:, pupils equally reactive to light and accomodation,no scleral icterus. HEENT: Atraumatic and Normocephalic Neck: supple, no JVD. Resp:Good air entry bilaterally,no rales  CVS: S1 S2 regular, no murmurs.  GI: Bowel sounds present, Non tender and not distended with no gaurding, rigidity or rebound. Extremities: B/L Lower Ext shows no edema, both legs are warm to touch Neurology: Unable to lift lower extremities off the bed, left wrist drop persists.  Able to lift bilateral upper arms off the bed.  Thinks he has a little more sensation in left groin and left leg compared to his right leg. Psychiatric: Normal judgment and insight. Normal mood. Musculoskeletal:No digital cyanosis Skin:No Rash, warm and dry Wounds:N/A I have  personally reviewed following labs and imaging studies  LABORATORY DATA: CBC: Recent Labs  Lab 03/30/17 0843 03/31/17 0753 04/01/17 0328 04/02/17 0403 04/03/17 0233  WBC 10.4 14.5* 25.7* 27.0* 19.1*  NEUTROABS  --   --   --  22.9*  --   HGB 14.0 12.4* 12.2* 11.5* 11.1*  HCT 40.9 35.5* 35.5* 33.3* 31.9*  MCV 87.8 87.9 87.0 87.2 88.1  PLT 208 182 198 216 016    Basic Metabolic Panel: Recent Labs  Lab 03/29/17 0357 03/31/17 0753 04/01/17 0328 04/02/17 0403 04/03/17 0233  NA 138 136 140 144 143  K 4.0 4.4 4.0 4.0 4.5  CL 103 103 106 113* 114*  CO2 25 21* 23 22 21*  GLUCOSE 144* 261* 232* 236* 242*  BUN 24* 42* 53* 49* 46*  CREATININE 1.00 1.47* 1.79* 1.45* 1.36*  CALCIUM 8.4* 8.3* 8.5* 8.1* 8.0*    GFR: Estimated Creatinine Clearance: 97.6 mL/min (A) (by C-G formula based on SCr of 1.36 mg/dL (H)).  Liver Function Tests: Recent Labs  Lab 03/31/17 0753 04/02/17 0403  AST 18 18  ALT 36 31  ALKPHOS 45 43  BILITOT 0.7 0.8  PROT 5.9* 5.7*  ALBUMIN 2.5* 2.1*   No results for input(s): LIPASE, AMYLASE in the last 168 hours. No results for input(s): AMMONIA in the last 168 hours.  Coagulation Profile: Recent Labs  Lab 03/28/17 1349  INR 1.36    Cardiac Enzymes: No results for input(s): CKTOTAL, CKMB, CKMBINDEX, TROPONINI in the last 168 hours.  BNP (last 3 results) No results for input(s): PROBNP in the last 8760 hours.  HbA1C: No results for input(s): HGBA1C in the last 72 hours.  CBG: Recent Labs  Lab 04/02/17 1144 04/02/17 1634 04/02/17 2044 04/03/17 0751 04/03/17 1135  GLUCAP 217* 234* 260* 242* 331*    Lipid Profile: No results for input(s): CHOL, HDL, LDLCALC, TRIG, CHOLHDL, LDLDIRECT in the last 72 hours.  Thyroid Function Tests: No results for input(s): TSH, T4TOTAL, FREET4, T3FREE, THYROIDAB in the last 72 hours.  Anemia Panel: No results for input(s): VITAMINB12, FOLATE, FERRITIN, TIBC, IRON, RETICCTPCT in the last 72  hours.  Urine analysis:    Component Value Date/Time   COLORURINE AMBER (A) 04/01/2017 1218   APPEARANCEUR CLOUDY (A) 04/01/2017 1218   LABSPEC 1.017 04/01/2017 1218   PHURINE 5.0 04/01/2017 1218   GLUCOSEU NEGATIVE 04/01/2017 1218   HGBUR LARGE (A) 04/01/2017 1218   BILIRUBINUR NEGATIVE 04/01/2017 1218   [place] 04/01/2017 1218   PROTEINUR 30 (A) 04/01/2017 1218   NITRITE NEGATIVE 04/01/2017 1218   LEUKOCYTESUR LARGE (A) 04/01/2017 1218    Sepsis Labs: Lactic Acid, Venous No results found for: LATICACIDVEN  MICROBIOLOGY: Recent Results (from the past 240 hour(s))  Surgical PCR screen     Status:  Abnormal   Collection Time: 03/24/17 10:26 PM  Result Value Ref Range Status   MRSA, PCR NEGATIVE NEGATIVE Final   Staphylococcus aureus POSITIVE (A) NEGATIVE Final    Comment: (NOTE) The Xpert SA Assay (FDA approved for NASAL specimens in patients 78 years of age and older), is one component of a comprehensive surveillance program. It is not intended to diagnose infection nor to guide or monitor treatment. Performed at [facility], [place] [address], [place], [place] [address]   Culture, blood (routine x 2)     Status: None (Preliminary result)   Collection Time: 04/01/17 12:30 AM  Result Value Ref Range Status   Specimen Description BLOOD RIGHT ARM  Final   Special Requests IN PEDIATRIC BOTTLE Blood Culture adequate volume  Final   Culture   Final    NO GROWTH 1 DAY Performed at [facility], [place] [address] [place], [place] [address]    Report Status PENDING  Incomplete  Culture, blood (routine x 2)     Status: None (Preliminary result)   Collection Time: 04/01/17 12:40 AM  Result Value Ref Range Status   Specimen Description BLOOD LEFT HAND  Final   Special Requests IN PEDIATRIC BOTTLE Blood Culture adequate volume  Final   Culture   Final    NO GROWTH 1 DAY Performed at [facility], [place] [address] [place], [place] [address]    Report  Status PENDING  Incomplete  Culture, Urine     Status: Abnormal (Preliminary result)   Collection Time: 04/01/17  1:11 PM  Result Value Ref Range Status   Specimen Description URINE, RANDOM  Final   Special Requests NONE  Final   Culture (A)  Final    >=100,000 COLONIES/mL ESCHERICHIA COLI 50,000 COLONIES/mL ENTEROBACTER SPECIES SUSCEPTIBILITIES TO FOLLOW Performed at [facility], [place] [address], [place], [place] [address]    Report Status PENDING  Incomplete   Organism ID, Bacteria ESCHERICHIA COLI (A)  Final      Susceptibility   Escherichia coli - MIC*    AMPICILLIN >=32 RESISTANT Resistant     CEFAZOLIN 16 SENSITIVE Sensitive     CEFTRIAXONE <=1 SENSITIVE Sensitive     CIPROFLOXACIN <=0.25 SENSITIVE Sensitive     GENTAMICIN <=1 SENSITIVE Sensitive     IMIPENEM <=0.25 SENSITIVE Sensitive     NITROFURANTOIN <=16 SENSITIVE Sensitive     TRIMETH/SULFA <=20 SENSITIVE Sensitive     AMPICILLIN/SULBACTAM 16 INTERMEDIATE Intermediate     PIP/TAZO <=4 SENSITIVE Sensitive     Extended ESBL NEGATIVE Sensitive     * >=100,000 COLONIES/mL ESCHERICHIA COLI    RADIOLOGY STUDIES/RESULTS: Dg Chest 2 View  Result Date: 03/21/2017 CLINICAL DATA:  Right-sided weakness. EXAM: CHEST  2 VIEW COMPARISON:  None. FINDINGS: AP and lateral views of the chest were obtained. The lungs are clear without focal pneumonia, edema, pneumothorax or pleural effusion. Minimal atelectasis noted left base. The cardiopericardial silhouette is within normal limits for size. The visualized bony structures of the thorax are intact. Telemetry leads overlie the chest. IMPRESSION: No active cardiopulmonary disease. Electronically Signed   By: Misty Stanley M.D.   On: 03/21/2017 13:08   Dg Cervical Spine 1 View  Result Date: 03/25/2017 CLINICAL DATA:  Cervical spine fusion. EXAM: DG C-ARM 61-120 MIN; DG CERVICAL SPINE - 1 VIEW COMPARISON:  03/23/2017. FINDINGS: Lower cervical anterior and interbody fusion. Hardware  intact. Anatomic alignment. Surgical sponges noted over the lower anterior neck. IMPRESSION: Lower  cervical anterior interbody fusion with anatomic alignment. Electronically Signed   By: Marcello Moores  Register   On: 03/25/2017 15:47   Ct Head Wo Contrast  Result Date: 03/31/2017 CLINICAL DATA:  Initial evaluation for generalized weakness, most prevalent within the right lower extremity. EXAM: CT HEAD WITHOUT CONTRAST TECHNIQUE: Contiguous axial images were obtained from the base of the skull through the vertex without intravenous contrast. COMPARISON:  Prior MRI from 03/23/2017. FINDINGS: Brain: Mildly advanced cerebral atrophy for age. Multiple scattered hypodensities seen throughout the periventricular and deep white matter both cerebral hemispheres, several of which radiate in a perpendicular fashion from the lateral ventricles, again concerning for demyelinating disease. Findings are grossly similar relative to recent MRI. No acute intracranial hemorrhage. No acute large vessel territory infarct. No mass lesion, midline shift or mass effect. No hydrocephalus. No extra-axial fluid collection. Vascular: Scalp soft tissues and calvarium within normal limits. Skull: Globes orbital soft tissues within normal limits. Paranasal sinuses and mastoid air cells are clear. Sinuses/Orbits: None. Other: None. IMPRESSION: 1. Scattered cerebral white-matter hypodensities in a distribution most consistent with underlying demyelinating disease, grossly stable relative to recent MRI. 2. No other new intracranial abnormality identified. Electronically Signed   By: Jeannine Boga M.D.   On: 03/31/2017 19:04   Mr Jeri Cos CN Contrast  Result Date: 03/23/2017 CLINICAL DATA:  Progressive RIGHT-sided weakness for 7 months. History of hypertension, diabetes. Follow-up cervical spinal cord lesions. EXAM: MRI HEAD WITHOUT AND WITH CONTRAST TECHNIQUE: Multiplanar, multiecho pulse sequences of the brain and surrounding structures were  obtained without and with intravenous contrast. CONTRAST:  66m MULTIHANCE GADOBENATE DIMEGLUMINE 529 MG/ML IV SOLN COMPARISON:  None. FINDINGS: INTRACRANIAL CONTENTS: Faint reduced diffusion bilateral occipital lobes, extending toward splenium of corpus callosum with normalized or increased diffusivity. At least 5 subcentimeter posterior fossa lesions. 4 mm T2 bright lesion ventral spinal cord. Far greater than 10 supratentorial white matter lesions, some of which correspond to diffusion abnormality, many of which radiate from the peri ventricular margin with low T1 signal seen with black holes of demyelination. Dominant lesion LEFT frontal periventricular white matter measuring 18 mm. Additional juxta cortical and deep white matter lesions. Two 9 mm lesions LEFT thalamus extending to LEFT hypothalamus. 4 mm lesion RIGHT basal ganglia mild parenchymal brain volume loss. No hydrocephalus. Faint marginal enhancement bilateral occipital lobe lesion (coronal 8/28 and 4/28 post contrast T1). No susceptibility artifact to suggest hemorrhage. No midline shift, mass effect. No abnormal extra-axial fluid collections or extra-axial enhancement. VASCULAR: Normal major intracranial vascular flow voids present at skull base. SKULL AND UPPER CERVICAL SPINE: No abnormal sellar expansion. No suspicious calvarial bone marrow signal. Craniocervical junction maintained. SINUSES/ORBITS: The mastoid air-cells and included paranasal sinuses are well-aerated.T2 bright signal LEFT optic nerve intracanalicular segment suspected, no definite enhancement though limited assessment. OTHER: None. IMPRESSION: 1. At least 5 infratentorial and greater than 10 supratentorial lesions consistent with chronic demyelination, with acute to subacute enhancing lesions bilateral occipital lobes. Subcentimeter LEFT thalamus and RIGHT basal ganglia demyelinating plaques. 2. Probable LEFT optic neuritis. 3. Mild parenchymal brain volume loss for age.  Electronically Signed   By: CElon AlasM.D.   On: 03/23/2017 22:20   Mr Lumbar Spine Wo Contrast  Result Date: 03/21/2017 CLINICAL DATA:  Increasing leg weakness.  Urinary incontinence. EXAM: MRI LUMBAR SPINE WITHOUT CONTRAST TECHNIQUE: Multiplanar, multisequence MR imaging of the lumbar spine was performed. No intravenous contrast was administered. COMPARISON:  None. FINDINGS: Segmentation:  Assumed standard. Alignment: Mild straightening of the lumbar  lordosis. Sagittal alignment is maintained. Vertebrae: Diffusely heterogeneous marrow signal throughout the lumbar spine with multifocal areas of abnormal marrow replacement. Similar appearing marrow signal abnormality is seen in the visualized sacrum, bilateral iliac bones, and bilateral lower thoracic ribs. Conus medullaris and cauda equina: Conus extends to the L1-L2 level. Patchy abnormal signal within the visualized lower thoracic cord. Clumping of the proximal cauda equina nerve roots due to downstream stenosis. Paraspinal and other soft tissues: Negative. Disc levels: T10-T11: Right lateral and anterolateral disc osteophyte complex. No stenosis. T10-T11: Right lateral disc osteophyte complex.  No stenosis. T12-L1:  Negative. L1-L2:  Negative. L2-L3: Diffuse disc bulge with superimposed large central disc protrusion and mild left greater than right facet arthropathy resulting in severe central spinal canal stenosis. No neuroforaminal stenosis. L3-L4: Diffuse disc bulge with superimposed central and left paracentral disc protrusion. Mild bilateral facet arthropathy. Severe central spinal canal stenosis and moderate left neuroforaminal stenosis. L4-L5: Diffuse disc bulge, slightly asymmetric to the left. Moderate bilateral facet arthropathy. Severe bilateral lateral recess stenosis. Moderate central spinal canal stenosis. Mild to moderate bilateral neuroforaminal stenosis. L5-S1: Diffuse disc bulge with superimposed central disc protrusion. Mild  narrowing of the bilateral lateral recesses. Disc material abuts the descending conjoined left S1 and S2 nerve roots. Mild left and severe right neuroforaminal stenosis. IMPRESSION: 1. Diffusely heterogeneous marrow signal throughout the visualized thoracolumbar spine, pelvis, and lower thoracic ribs, concerning for multiple myeloma or metastatic disease. 2. Patchy, abnormal signal throughout the visualized lower thoracic spinal cord, nonspecific. Recommend further evaluation with MRI of the cervical and thoracic spine with and without contrast. 3. Multilevel degenerative changes throughout the lumbar spine with severe central spinal canal stenosis at L2-L3 and L3-L4, likely accounting for the patient's symptoms. 4. Severe right neuroforaminal stenosis at L5-S1. Moderate left neuroforaminal stenosis at L3-L4. Electronically Signed   By: Titus Dubin M.D.   On: 03/21/2017 16:27   Mr Cervical Spine W Wo Contrast  Addendum Date: 04/01/2017   ADDENDUM REPORT: 04/01/2017 12:08 ADDENDUM: Critical Value/emergent results were called by telephone at the time of interpretation on 04/01/2017 at 1150 hours to Dr. Oren Binet , who verbally acknowledged these results. He advises that the patient's hemoglobin value has trended down 3 points in the past four days, arguing in favor of the abnormal fluid collection in this study representing hematoma. We discussed, however, that the possibility of abscess is not excluded by imaging. Electronically Signed   By: Genevie Ann M.D.   On: 04/01/2017 12:08   Result Date: 04/01/2017 CLINICAL DATA:  52 year old male postoperative day 7 status post C6 corpectomy, anterior cervical spine instrumentation and ACDF spanning C5-C7 4 decompression of spinal cord and exiting nerve roots. Fever overnight, bilateral lower extremity weakness. Evidence of diffuse demyelinating disease in the brain and spine. Status post a course of steroids. History of type 2 diabetes, morbid obesity. EXAM: MRI  CERVICAL SPINE WITHOUT AND WITH CONTRAST TECHNIQUE: Multiplanar and multiecho pulse sequences of the cervical spine, to include the craniocervical junction and cervicothoracic junction, were obtained without and with intravenous contrast. CONTRAST:  51m MULTIHANCE GADOBENATE DIMEGLUMINE 529 MG/ML IV SOLN COMPARISON:  Preoperative cervical spine MRI 03/21/2017. FINDINGS: Alignment: Straightening of the cervical spine. No spondylolisthesis. Vertebrae: Hardware susceptibility artifact from the C5 to the C7 level. Evidence of anterior corpectomy of C6. Underlying diffuse abnormal bone marrow signal throughout the visible spine. No marrow edema or evidence of acute osseous abnormality. Cord: New confluent abnormal T2 and STIR hyperintensity within the cervical spinal cord  from the C3-C4 to the C5-C6 cord level. This has the appearance of holo cord edema on sagittal T2 series 4, image 10. There were superimposed multifocal patchy T2 and STIR hyperintense cord lesions, and 1 such lesion is redemonstrated at the C6-C7 cord level just inferior to the holo cord abnormality. Following contrast there is only faint residual intramedullary enhancement corresponding to the C6-C7 lesion. There is questionable mild increased leptomeningeal enhancement of the cord above C6-C7. The spinal cord below C7 and above the C2 body appears normal. Stable visible brainstem with small medullary and pontine nonenhancing lesions. Posterior Fossa, vertebral arteries, paraspinal tissues: There is a large prevertebral fluid collection tracking inferiorly from the C4 level into the upper thorax, and tracking laterally to the right at the C5-C6 level probably along the surgical approach. In the upper thorax the collection then tracks posteriorly into the right lateral paraspinal soft tissues. The collection is best demonstrated on postcontrast images series 11, image 10 series 13, image 20 and series 14, images 10-13. The entire collection  encompasses 29 by at least 150 x 84 millimeters, and has an estimated volume of greater than 100 mL. The collection is mildly rim enhancing, but dark on all sequences with small areas of internal T2 and STIR hyperintensity. I favor this is postoperative hematoma. Subsequently there is anterior mass effect on the trachea and esophagus, but no significant airway narrowing is demonstrated. The cervical esophagus appears severely affected. The prevertebral soft tissue mass effect continues to the thoracic inlet. Disc levels: The C2-C3 level is unchanged.  The C7-T1 level is unchanged. From C3-C4 through C6-C7 there is progressive spinal stenosis which appears related to a ventral epidural space occupying lesion tracking linearly posterior to the vertebral bodies. This measures up to 3-4 millimeters in thickness and appears maximal at the C5 and C6 levels. It has similar decreased signal on all sequences suggesting hematoma. Axial images demonstrate this new space-occupying process best on series 8 images 7and 11, and series 9, image 14. Associated increased spinal cord mass effect at these levels felt related to the new abnormal cord signal. IMPRESSION: 1. New spinal cord compression and suspected cord edema from the C3 level through the C6 level appears related to material in the ventral epidural space which is favored to be hematoma (see #2). New holo-cord edema from the C3 to the C6 cord levels, superimposed on several preexisting demyelinating spinal cord lesions. 2. Large prevertebral fluid collection tracking from C4 into the upper thoracic spine, and dissecting through the right lateral neck likely along the surgical approach. In the upper thorax the collection continues and tracks into the right lateral paraspinal space. The fluid is dark on all sequences with mild rim enhancement, and favored to be a large hematoma with estimated volume exceeding 100 mL. 3. Associated regional mass effect on the neck and  thoracic inlet soft tissues. No significant airway narrowing is demonstrated. 4. Postoperative changes C5 through C7 including partial C6 corpectomy. Electronically Signed: By: Genevie Ann M.D. On: 04/01/2017 11:43   Mr Cervical Spine W Or Wo Contrast  Result Date: 03/21/2017 CLINICAL DATA:  Bilateral lower extremity weakness. Urinary incontinence. Abnormal distal spinal cord on lumbar spine MRI today. EXAM: MRI CERVICAL AND THORACIC SPINE WITHOUT AND WITH CONTRAST TECHNIQUE: Multiplanar and multiecho pulse sequences of the cervical spine, to include the craniocervical junction and cervicothoracic junction, and thoracic spine, were obtained without and with intravenous contrast. CONTRAST:  51m MULTIHANCE GADOBENATE DIMEGLUMINE 529 MG/ML IV SOLN COMPARISON:  Lumbar spine MRI today FINDINGS: MRI CERVICAL SPINE FINDINGS The study is moderately motion degraded. Alignment: Cervical spine straightening.  No significant listhesis. Vertebrae: Diffuse heterogeneous replacement of normal marrow signal throughout the spine as described on prior lumbar MRI. No dominant enhancing lesion. Preserved vertebral body heights without evidence of fracture. Cord: There is abnormal T2 hyperintensity in the right lateral spinal cord at C3-4 extending over a length of approximately 1.5 cm. Smaller areas of abnormal T2 signal are present at the superior C6 endplate level and at the C6-7 disc space level, with the latter demonstrating enhancement. Posterior Fossa, vertebral arteries, paraspinal tissues: Discrete 4 mm T2 hyperintense nonenhancing lesion in the medulla. Additional T2 hyperintense lesions in the pons and left cerebellum. Disc levels: The cervical spinal canal is small in caliber diffusely on a congenital basis. C2-3: Negative. C3-4: Shallow central disc protrusion without significant stenosis. C4-5: Mild disc bulging and uncovertebral spurring result in mild spinal stenosis and moderate bilateral neural foraminal stenosis.  C5-6: Broad-based posterior disc osteophyte complex results in moderate spinal stenosis with mild cord flattening and moderate to severe bilateral neural foraminal stenosis. C6-7: Broad-based posterior disc osteophyte complex and central disc extrusion result in severe spinal stenosis with moderate cord flattening and severe bilateral neural foraminal stenosis. C7-T1: Mild uncovertebral spurring results in at most mild bilateral neural foraminal stenosis. No spinal stenosis. MRI THORACIC SPINE FINDINGS The study is mildly motion degraded. Alignment: Normal. Vertebrae: Diffuse heterogeneous replacement of normal marrow signal throughout the spine and ribs as well as visualized clavicles and sternum. No dominant enhancing lesion. Preserved vertebral body heights without evidence of fracture. Cord: As seen on today's earlier lumbar MRI, there is patchy T2 hyperintensity in the distal thoracic spinal cord, with the most discrete lesion being on the left at T12. There is a small T2 hyperintense focus in the left central cord at T6-7. A small lesion versus artifact is questioned at T3. There is the suggestion of focal enhancement along the midline dorsal cord at T5 on axial images (series 21, image 21), however this is not confirmed on sagittal images. Paraspinal and other soft tissues: Unremarkable. Disc levels: Endplate spurring results in mild left neural foraminal stenosis at T1-2. A right posterolateral/foraminal disc osteophyte at T2-3 results in mild right neural foraminal stenosis. A shallow left paracentral disc osteophyte complex and left facet hypertrophy at T6-7 result in mild spinal stenosis and mild left neural foraminal stenosis. A slightly larger left paracentral disc osteophyte complex at T7-8 combines with facet hypertrophy to result in mild spinal stenosis. There is at most mild disc bulging more inferiorly in the thoracic spine without significant stenosis. IMPRESSION: 1. Scattered T2 hyperintense  lesions throughout the cervical and thoracic spinal cord as well as in the pons, medulla, and cerebellum. A lesion at C6-7 is enhancing. While some lesions are associated with spinal stenosis and could in isolation reflect spondylotic myelopathy, the presence of lesions elsewhere in the cord and posterior fossa are indicative of a more widespread process such as demyelinating disease. Infection, inflammatory conditions such as sarcoidosis, and metastatic disease are additional considerations. Brain MRI and CSF analysis are recommended. 2. 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 3. Mild spinal stenosis at T6-7 and T7-8 due to disc and facet degeneration. 4. Diffusely abnormal bone marrow signal. Considerations include multiple myeloma, metastatic disease, and other infiltrative/myelofibrotic marrow processes. Electronically Signed   By: Logan Bores M.D.   On: 03/21/2017 21:44  Mr Thoracic Spine W Wo Contrast  Result Date: 03/21/2017 CLINICAL DATA:  Bilateral lower extremity weakness. Urinary incontinence. Abnormal distal spinal cord on lumbar spine MRI today. EXAM: MRI CERVICAL AND THORACIC SPINE WITHOUT AND WITH CONTRAST TECHNIQUE: Multiplanar and multiecho pulse sequences of the cervical spine, to include the craniocervical junction and cervicothoracic junction, and thoracic spine, were obtained without and with intravenous contrast. CONTRAST:  73m MULTIHANCE GADOBENATE DIMEGLUMINE 529 MG/ML IV SOLN COMPARISON:  Lumbar spine MRI today FINDINGS: MRI CERVICAL SPINE FINDINGS The study is moderately motion degraded. Alignment: Cervical spine straightening.  No significant listhesis. Vertebrae: Diffuse heterogeneous replacement of normal marrow signal throughout the spine as described on prior lumbar MRI. No dominant enhancing lesion. Preserved vertebral body heights without evidence of fracture. Cord: There is abnormal T2 hyperintensity in the right  lateral spinal cord at C3-4 extending over a length of approximately 1.5 cm. Smaller areas of abnormal T2 signal are present at the superior C6 endplate level and at the C6-7 disc space level, with the latter demonstrating enhancement. Posterior Fossa, vertebral arteries, paraspinal tissues: Discrete 4 mm T2 hyperintense nonenhancing lesion in the medulla. Additional T2 hyperintense lesions in the pons and left cerebellum. Disc levels: The cervical spinal canal is small in caliber diffusely on a congenital basis. C2-3: Negative. C3-4: Shallow central disc protrusion without significant stenosis. C4-5: Mild disc bulging and uncovertebral spurring result in mild spinal stenosis and moderate bilateral neural foraminal stenosis. C5-6: Broad-based posterior disc osteophyte complex results in moderate spinal stenosis with mild cord flattening and moderate to severe bilateral neural foraminal stenosis. C6-7: Broad-based posterior disc osteophyte complex and central disc extrusion result in severe spinal stenosis with moderate cord flattening and severe bilateral neural foraminal stenosis. C7-T1: Mild uncovertebral spurring results in at most mild bilateral neural foraminal stenosis. No spinal stenosis. MRI THORACIC SPINE FINDINGS The study is mildly motion degraded. Alignment: Normal. Vertebrae: Diffuse heterogeneous replacement of normal marrow signal throughout the spine and ribs as well as visualized clavicles and sternum. No dominant enhancing lesion. Preserved vertebral body heights without evidence of fracture. Cord: As seen on today's earlier lumbar MRI, there is patchy T2 hyperintensity in the distal thoracic spinal cord, with the most discrete lesion being on the left at T12. There is a small T2 hyperintense focus in the left central cord at T6-7. A small lesion versus artifact is questioned at T3. There is the suggestion of focal enhancement along the midline dorsal cord at T5 on axial images (series 21, image  21), however this is not confirmed on sagittal images. Paraspinal and other soft tissues: Unremarkable. Disc levels: Endplate spurring results in mild left neural foraminal stenosis at T1-2. A right posterolateral/foraminal disc osteophyte at T2-3 results in mild right neural foraminal stenosis. A shallow left paracentral disc osteophyte complex and left facet hypertrophy at T6-7 result in mild spinal stenosis and mild left neural foraminal stenosis. A slightly larger left paracentral disc osteophyte complex at T7-8 combines with facet hypertrophy to result in mild spinal stenosis. There is at most mild disc bulging more inferiorly in the thoracic spine without significant stenosis. IMPRESSION: 1. Scattered T2 hyperintense lesions throughout the cervical and thoracic spinal cord as well as in the pons, medulla, and cerebellum. A lesion at C6-7 is enhancing. While some lesions are associated with spinal stenosis and could in isolation reflect spondylotic myelopathy, the presence of lesions elsewhere in the cord and posterior fossa are indicative of a more widespread process such as demyelinating disease. Infection, inflammatory conditions  such as sarcoidosis, and metastatic disease are additional considerations. Brain MRI and CSF analysis are recommended. 2. 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 3. Mild spinal stenosis at T6-7 and T7-8 due to disc and facet degeneration. 4. Diffusely abnormal bone marrow signal. Considerations include multiple myeloma, metastatic disease, and other infiltrative/myelofibrotic marrow processes. Electronically Signed   By: Logan Bores M.D.   On: 03/21/2017 21:44   Dg Chest Port 1v Same Day  Result Date: 04/01/2017 CLINICAL DATA:  Shortness of breath, atrial fibrillation, diabetes, multiple scleroses. EXAM: PORTABLE CHEST 1 VIEW COMPARISON:  Chest x-ray of March 21, 2017 FINDINGS: The lungs are well-expanded. There is  subtle increased interstitial density in the lower third of the right lung. The heart and pulmonary vascularity are normal. The mediastinum is normal in width with exception of soft tissue fullness in the right paratracheal region. IMPRESSION: Probable subsegmental atelectasis at the right lung base.  No CHF. Soft tissue fullness in the right paratracheal region is more conspicuous than on the previous study. This may reflect lymphadenopathy, intrathoracic border, or vascular structure. Given the patient's lower anterior cervical fusion 1 week ago, hematoma or infection may be present. Contrast-enhanced CT scanning of the neck and chest is recommended. Electronically Signed   By: David  Martinique M.D.   On: 04/01/2017 08:10   Dg C-arm 1-60 Min  Result Date: 03/25/2017 CLINICAL DATA:  Cervical spine fusion. EXAM: DG C-ARM 61-120 MIN; DG CERVICAL SPINE - 1 VIEW COMPARISON:  03/23/2017. FINDINGS: Lower cervical anterior and interbody fusion. Hardware intact. Anatomic alignment. Surgical sponges noted over the lower anterior neck. IMPRESSION: Lower cervical anterior interbody fusion with anatomic alignment. Electronically Signed   By: Marcello Moores  Register   On: 03/25/2017 15:47   Dg Fluoro Guide Lumbar Puncture  Result Date: 03/22/2017 CLINICAL DATA:  Lower extremity weakness. Spinal cord lesions on MRI. Severe multilevel lumbar stenosis. EXAM: DIAGNOSTIC LUMBAR PUNCTURE UNDER FLUOROSCOPIC GUIDANCE FLUOROSCOPY TIME:  Fluoroscopy Time:  0 minutes, 42 seconds Radiation Exposure Index (if provided by the fluoroscopic device): 8.9 mGy Number of Acquired Spot Images: 0 PROCEDURE: I discussed the risks (including hemorrhage, infection, headache, and nerve damage, among others), benefits, and alternatives to fluoroscopically guided lumbar puncture with the patient. We specifically discussed the high technical likelihood of success of the procedure. The patient understood and elected to undergo the procedure. Prior to the  procedure I reviewed of the patient's lumbar spine MRI and determined that L5-S1 was the optimum level. Standard time-out was employed. Following sterile skin prep and local anesthetic administration consisting of 1 percent lidocaine, a 20 gauge spinal needle was advanced without difficulty into the thecal sac at the at the L5-S1 level from a slightly paramedian approach. Clear CSF was returned. Opening pressure was not obtained. 15 cc of clear CSF was collected. The needle was subsequently removed and the skin cleansed and bandaged. No immediate complications were observed. IMPRESSION: 1. Technically successful fluoroscopically lumbar puncture at the L5-S1 level yielding 15 cc of clear CSF. Electronically Signed   By: Van Clines M.D.   On: 03/22/2017 17:10     LOS: 58 days   Oren Binet, MD  [facility] Pager:[phone]  If 7PM-7AM, please contact night-coverage [email or web address] Password [facility] 04/03/2017, 12:18 PM

## 2017-04-04 DIAGNOSIS — Z95828 Presence of other vascular implants and grafts: Secondary | ICD-10-CM

## 2017-04-04 LAB — BASIC METABOLIC PANEL
Anion gap: 7 (ref 5–15)
BUN: 50 mg/dL — AB (ref 6–20)
CALCIUM: 8.1 mg/dL — AB (ref 8.9–10.3)
CHLORIDE: 115 mmol/L — AB (ref 101–111)
CO2: 21 mmol/L — ABNORMAL LOW (ref 22–32)
Creatinine, Ser: 1.55 mg/dL — ABNORMAL HIGH (ref 0.61–1.24)
GFR calc Af Amer: 59 mL/min — ABNORMAL LOW (ref >60)
GFR calc non Af Amer: 51 mL/min — ABNORMAL LOW (ref >60)
Glucose, Bld: 279 mg/dL — ABNORMAL HIGH (ref 65–99)
Potassium: 4.5 mmol/L (ref 3.5–5.1)
SODIUM: 143 mmol/L (ref 135–145)

## 2017-04-04 LAB — CBC
HCT: 31.5 % — ABNORMAL LOW (ref 39.0–52.0)
HEMOGLOBIN: 10.6 g/dL — AB (ref 13.0–17.0)
MCH: 29.6 pg (ref 26.0–34.0)
MCHC: 33.7 g/dL (ref 30.0–36.0)
MCV: 88 fL (ref 78.0–100.0)
Platelets: 233 10*3/uL (ref 150–400)
RBC: 3.58 MIL/uL — ABNORMAL LOW (ref 4.22–5.81)
RDW: 13.5 % (ref 11.5–15.5)
WBC: 13.5 10*3/uL — ABNORMAL HIGH (ref 4.0–10.5)

## 2017-04-04 LAB — GLUCOSE, CAPILLARY
GLUCOSE-CAPILLARY: 236 mg/dL — AB (ref 65–99)
GLUCOSE-CAPILLARY: 253 mg/dL — AB (ref 65–99)
Glucose-Capillary: 308 mg/dL — ABNORMAL HIGH (ref 65–99)
Glucose-Capillary: 351 mg/dL — ABNORMAL HIGH (ref 65–99)

## 2017-04-04 LAB — URINE CULTURE: Culture: >=100000 — AB

## 2017-04-04 MED ORDER — DEXAMETHASONE SODIUM PHOSPHATE 4 MG/ML IJ SOLN
3.0000 mg | Freq: Three times a day (TID) | INTRAMUSCULAR | Status: DC
  Administered 2017-04-04 – 2017-04-09 (×15): 3 mg via INTRAVENOUS
  Filled 2017-04-04 (×15): qty 0.75

## 2017-04-04 MED ORDER — AMIODARONE HCL 200 MG PO TABS
200.0000 mg | ORAL_TABLET | Freq: Two times a day (BID) | ORAL | Status: DC
  Administered 2017-04-04 – 2017-04-09 (×10): 200 mg via ORAL
  Filled 2017-04-04 (×11): qty 1

## 2017-04-04 NOTE — Progress Notes (Signed)
Pharmacy Antibiotic Note  Dylan [name] is a 52 y.o. male admitted on 03/21/2017 with possible sepsis vs post-op cervical abscess/infection. Pharmacy has been consulted for vancomycin and cefepime dosing. ID is following.  Patient on day 4 of broad-spectrum antibiotics for suspected spinal infection. Appears to be improving clinically: WBC down to 13.5, afebrile, vital signs stable. Patient suffered AKI that appears to be improving: peak creatinine 1.79, down to 1.36 but today up again to 1.55. Normalized CrCl ~60, which is borderline for current vancomycin dose.   Plan: Vancomycin 1000 mg IV every 12 hours (changed from 1500 mg q24). Target trough 15-20. Continue cefepime 2g IV every 12 hours Monitor renal function, C&S, clinical status Consider vanc trough prior to 4th dose (Monday PM)  Height: 6\' 4"  (193 cm) Weight: 291 lb 14.2 oz (132.4 kg) IBW/kg (Calculated) : 86.8  Temp (24hrs), Avg:98.3 F (36.8 C), Min:97.5 F (36.4 C), Max:98.6 F (37 C)  Recent Labs  Lab 03/31/17 0753 04/01/17 0328 04/02/17 0403 04/03/17 0233 04/04/17 0628  WBC 14.5* 25.7* 27.0* 19.1* 13.5*  CREATININE 1.47* 1.79* 1.45* 1.36* 1.55*    Estimated Creatinine Clearance: 85.6 mL/min (A) (by C-G formula based on SCr of 1.55 mg/dL (H)).    No Known Allergies  Antimicrobials this admission:   Cefazolin peri-op 2/21>>2/22   Bactroban/CHG 2/21>>2/25   Vancomycin 2/28>>   Cefepime 2/28>>  Dose adjustments this admission: Vanc changed empirically from 1500 q24 to 1000 q 12 in s/o improving renal fxn (nCrCl 66 at time of dose change)  Microbiology results:  2/18 CSF anaerobic - no anaerobes isolated  2/18 CSF: negative  2/18 CSF fungal : pending  2/20 MRSA PCR : MRSA negative, Staph aureus positive  2/28 blood x 2 - ng x 2 days  2/28 urine - >100K E coli - R amp, I unasyn  Thank you for allowing pharmacy to be a part of this patient's care.  Al Corpus, PharmD PGY1 Pharmacy  Resident Phone: [phone] After 3:30PM please call Main Pharmacy [phone] 04/04/2017 11:50 AM

## 2017-04-04 NOTE — Progress Notes (Signed)
PHARMACY CONSULT NOTE FOR:  OUTPATIENT  PARENTERAL ANTIBIOTIC THERAPY (OPAT)  Indication: Spinal Abscess Regimen: Vancomycin 1000 mg IV every 12 hours       Cefepime 2 gram IV every 12 hours End date: 05/16/2017  IV antibiotic discharge orders are pended. To discharging provider:  please sign these orders via discharge navigator,  Select New Orders & click on the button choice - Manage This Unsigned Work.     Thank you for allowing pharmacy to be a part of this patient's care.   Al Corpus, PharmD PGY1 Pharmacy Resident Phone: [phone] After 3:30PM please call Main Pharmacy [phone] 04/04/2017, 1:38 PM

## 2017-04-04 NOTE — Progress Notes (Signed)
PROGRESS NOTE        PATIENT DETAILS Name: Dylan [name] Age: 52 y.o. Sex: male Date of Birth: 29-Jan-1966 Admit Date: 03/21/2017 Admitting Physician Vianne Bulls, MD OIZ:[id number], No Pcp Per  Brief Narrative: Patient is a 52 y.o. male with history of DM-2, hypertension who presented with generalized weakness but mostly with progressive weakness in his right leg-patient was evaluated by neurology-and underwent workup which demonstrated cord compression at C5-C6, and C6-C7 level due to severe cervical stenosis, patient was also diagnosed with demyelinating disease (MS vs NMO).  Patient was started on IV steroids, and neurosurgery was consulted, patient underwent corpectomy at C6 with decompression on 2/21.  Further hospital course was complicated by development of atrial fibrillation with RVR.  He was briefly placed on Cardizem infusion, anticoagulation with heparin, once his rate his rate was appropriately controlled- plans to discharge to CIR on 2/26, however patient developed lethargy and discharge was held. However on 2/27, patient was found to have A. fib with rapid ventricular response-requiring initiation of IV amiodarone.  Hospital course now has been complicated by development of fever, leukocytosis and acute kidney injury-unfortunately upon further evaluation with MRI C Spine-which showed either a hematoma or a abscess causing cord compression, subsequently re-evaluated by Neurosurgery, and thought not to be a candidate for repeat surgery given poor chance of any meaningful recovery. See below for further details.   Subjective: Feels overall much better than the past few days-afebrile.  Not able to move his lower extremities.  Assessment/Plan: Cervical cord compression: initially felt to be secondary to severe spinal stenosis-underwent corpectomy and decompressive surgery on 2/21, unfortunately hospital course has been complicated by development of either a  hematoma or a epidural abscess causing cord compresion-as seen on MRI C Spine on 2/28. Per Neurosurgery, not felt to be a candidate for repeat surgery given poor chance of any meaningful recovery.  Neuro exam remains unchanged-he continues to have significant weakness in his lower extremitie.  Remains on empiric vancomycin and cefepime.  He had atrial fibrillation during this hospital stay and was briefly anticoagulated-but given possible findings of spinal hematoma-he is no longer on anticoagulation.  We will start tapering Decadron. Very difficult situation-also has multiple sclerosis-per family-patient had been struggling to walk for a few years (walking with a cane with progressive right>> left leg weakness)-and for the past few months had progressive decline in function-and subsequently presented to the ED when he was unable to get out of bed..  Sepsis: Sepsis pathophysiology has essentially resolved.  Likely secondary to a cervical epidural abscess and complicated UTI.  Remains on empiric vancomycin and cefepime.  Blood cultures negative so far, urine cultures shows E. coli.  Appreciate infectious disease recommendations-PICC line placed-plans are to continue with vancomycin and cefepime with stop date of May 16, 2017.   A. fib with RVR: Continues to be in sinus rhythm-initially required amiodarone and heparin infusion-followed by cardiology-he is maintaining sinus rhythm on oral amiodarone and Cardizem.  Given possible spinal hematoma-no longer on anticoagulation.  Echocardiogram showed EF of around 50-55%.  Chads 2 Vas score is 2 (DM, HTN).  Acute kidney injury: Likely hemodynamically mediated-in the setting of fever, A. fib RVR, and the use of ACE inhibitor-usually improved with IV fluids-but seems to have plateaued-continue to hold lisinopril and on other nephrotoxic agents.  Recheck electrolytes tomorrow morning.  Newly diagnosed multiple sclerosis: Completed course of IV steroids, plans were  to follow-up with [facility] neurology-Dr. Felecia Shelling.  DM 2 with hyperglycemia: CBGs on the higher side-as patient on Decadron-Lantus was just increased to 48 units on 3/2-since Decadron dosage being tapered down-we will continue with current regimen of 48 units of Lantus, 6 units of NovoLog with meals and SSI.  Reassess tomorrow and adjust accordingly.    Hypertension: Controlled, continue with  Cardizem.  Morbid obesity  Telemetry (independently reviewed): Afib with RVR  Echo (reviewed):EF 50-55% on TTE done on 2/26  DVT Prophylaxis: Anticoagulation currently on hold  Code Status: Full code   Family Communication: None at bedside-spoke with cousin Carlyon Shadow) over the phone on 3/2.  Disposition Plan: Remain inpatient-hopefully to CIR early next week  Antimicrobial agents: Anti-infectives (From admission, onward)   Start     Dose/Rate Route Frequency Ordered Stop   04/04/17 0800  vancomycin (VANCOCIN) IVPB 1000 mg/200 mL premix     1,000 mg 200 mL/hr over 60 Minutes Intravenous Every 12 hours 04/03/17 1449     04/01/17 1500  vancomycin (VANCOCIN) 1,500 mg in sodium chloride 0.9 % 500 mL IVPB     1,500 mg 250 mL/hr over 120 Minutes Intravenous Every 24 hours 04/01/17 1326 04/04/17 0700   04/01/17 1400  ceFEPIme (MAXIPIME) 2 g in sodium chloride 0.9 % 100 mL IVPB     2 g 200 mL/hr over 30 Minutes Intravenous Every 12 hours 04/01/17 1326     03/25/17 1900  ceFAZolin (ANCEF) IVPB 2g/100 mL premix     2 g 200 mL/hr over 30 Minutes Intravenous Every 8 hours 03/25/17 1830 03/26/17 2227   03/25/17 1404  bacitracin 50,000 Units in sodium chloride irrigation 0.9 % 500 mL irrigation  Status:  Discontinued       As needed 03/25/17 1404 03/25/17 1551   03/25/17 1200  ceFAZolin (ANCEF) 3 g in dextrose 5 % 50 mL IVPB     3 g 130 mL/hr over 30 Minutes Intravenous 30 min pre-op 03/24/17 1535 03/25/17 1325      Procedures: 2/21>> cervical spine decompression 3/2>>PICC  CONSULTS:   cardiology, neurology and Neurosurgery  Time spent: 25  minutes-Greater than 50% of this time was spent in counseling, explanation of diagnosis, planning of further management, and coordination of care.  MEDICATIONS: Scheduled Meds: . amiodarone  200 mg Oral BID  . B-complex with vitamin C  1 tablet Oral Daily  . dexamethasone  4 mg Intravenous Q6H  . diltiazem  30 mg Oral Q8H  . insulin aspart  0-20 Units Subcutaneous TID WC  . insulin aspart  0-5 Units Subcutaneous QHS  . insulin aspart  6 Units Subcutaneous TID WC  . insulin glargine  48 Units Subcutaneous QHS  . pantoprazole  40 mg Oral Daily  . sodium chloride flush  10-40 mL Intracatheter Q12H  . sodium chloride flush  3 mL Intravenous Q12H   Continuous Infusions: . sodium chloride 75 mL/hr at 04/03/17 2232  . ceFEPime (MAXIPIME) IV Stopped (04/04/17 0130)  . methocarbamol (ROBAXIN)  IV    . vancomycin Stopped (04/04/17 1057)   PRN Meds:.acetaminophen **OR** acetaminophen, benzonatate, bisacodyl, menthol-cetylpyridinium **OR** phenol, methocarbamol **OR** methocarbamol (ROBAXIN)  IV, metoprolol tartrate, ondansetron **OR** ondansetron (ZOFRAN) IV, senna-docusate, sodium chloride flush   PHYSICAL EXAM: Vital signs: Vitals:   04/03/17 2219 04/04/17 0009 04/04/17 0537 04/04/17 0736  BP: 140/73 133/80 128/74 (!) 141/77  Pulse:  71  75  Resp:  19  18  Temp:  98.5 F (36.9 C)  98.6 F (37 C)  TempSrc:  Oral  Oral  SpO2:  100%  98%  Weight:      Height:       Filed Weights   03/22/17 0839 03/31/17 0457 04/03/17 0434  Weight: 130.9 kg (288 lb 9.3 oz) (!) 136.7 kg (301 lb 5.9 oz) 132.4 kg (291 lb 14.2 oz)   Body mass index is 35.53 kg/m.    General appearance :Awake, alert, not in any distress.  Eyes:, pupils equally reactive to light and accomodation,no scleral icterus. HEENT: Atraumatic and Normocephalic Neck: supple, no JVD. Resp:Good air entry bilaterally CVS: S1 S2 regular, no murmurs.  GI: Bowel sounds  present, Non tender and not distended with no gaurding, rigidity or rebound. Extremities: B/L Lower Ext shows no edema, both legs are warm to touch Neurology: Left wrist drop appears unchanged, able to lift bilateral arms off the bed.  Still unable to lift bilateral lower extremities off the bed. Psychiatric: Normal judgment and insight. Normal mood. Musculoskeletal:No digital cyanosis Skin:No Rash, warm and dry Wounds:N/A  I have personally reviewed following labs and imaging studies  LABORATORY DATA: CBC: Recent Labs  Lab 03/31/17 0753 04/01/17 0328 04/02/17 0403 04/03/17 0233 04/04/17 0628  WBC 14.5* 25.7* 27.0* 19.1* 13.5*  NEUTROABS  --   --  22.9*  --   --   HGB 12.4* 12.2* 11.5* 11.1* 10.6*  HCT 35.5* 35.5* 33.3* 31.9* 31.5*  MCV 87.9 87.0 87.2 88.1 88.0  PLT 182 198 216 229 335    Basic Metabolic Panel: Recent Labs  Lab 03/31/17 0753 04/01/17 0328 04/02/17 0403 04/03/17 0233 04/04/17 0628  NA 136 140 144 143 143  K 4.4 4.0 4.0 4.5 4.5  CL 103 106 113* 114* 115*  CO2 21* 23 22 21* 21*  GLUCOSE 261* 232* 236* 242* 279*  BUN 42* 53* 49* 46* 50*  CREATININE 1.47* 1.79* 1.45* 1.36* 1.55*  CALCIUM 8.3* 8.5* 8.1* 8.0* 8.1*    GFR: Estimated Creatinine Clearance: 85.6 mL/min (A) (by C-G formula based on SCr of 1.55 mg/dL (H)).  Liver Function Tests: Recent Labs  Lab 03/31/17 0753 04/02/17 0403  AST 18 18  ALT 36 31  ALKPHOS 45 43  BILITOT 0.7 0.8  PROT 5.9* 5.7*  ALBUMIN 2.5* 2.1*   No results for input(s): LIPASE, AMYLASE in the last 168 hours. No results for input(s): AMMONIA in the last 168 hours.  Coagulation Profile: Recent Labs  Lab 03/28/17 1349  INR 1.36    Cardiac Enzymes: No results for input(s): CKTOTAL, CKMB, CKMBINDEX, TROPONINI in the last 168 hours.  BNP (last 3 results) No results for input(s): PROBNP in the last 8760 hours.  HbA1C: No results for input(s): HGBA1C in the last 72 hours.  CBG: Recent Labs  Lab  04/03/17 1135 04/03/17 1650 04/03/17 2158 04/04/17 0914 04/04/17 1108  GLUCAP 331* 282* 297* 236* 308*    Lipid Profile: No results for input(s): CHOL, HDL, LDLCALC, TRIG, CHOLHDL, LDLDIRECT in the last 72 hours.  Thyroid Function Tests: No results for input(s): TSH, T4TOTAL, FREET4, T3FREE, THYROIDAB in the last 72 hours.  Anemia Panel: No results for input(s): VITAMINB12, FOLATE, FERRITIN, TIBC, IRON, RETICCTPCT in the last 72 hours.  Urine analysis:    Component Value Date/Time   COLORURINE AMBER (A) 04/01/2017 1218   APPEARANCEUR CLOUDY (A) 04/01/2017 1218   LABSPEC 1.017 04/01/2017 1218   PHURINE 5.0 04/01/2017 [place] 04/01/2017 1218  HGBUR LARGE (A) 04/01/2017 1218   BILIRUBINUR NEGATIVE 04/01/2017 [place] 04/01/2017 1218   PROTEINUR 30 (A) 04/01/2017 1218   NITRITE NEGATIVE 04/01/2017 1218   LEUKOCYTESUR LARGE (A) 04/01/2017 1218    Sepsis Labs: Lactic Acid, Venous No results found for: LATICACIDVEN  MICROBIOLOGY: Recent Results (from the past 240 hour(s))  Culture, blood (routine x 2)     Status: None (Preliminary result)   Collection Time: 04/01/17 12:30 AM  Result Value Ref Range Status   Specimen Description BLOOD RIGHT ARM  Final   Special Requests IN PEDIATRIC BOTTLE Blood Culture adequate volume  Final   Culture   Final    NO GROWTH 2 DAYS Performed at [facility], [place] [address] [place], [place] [address]    Report Status PENDING  Incomplete  Culture, blood (routine x 2)     Status: None (Preliminary result)   Collection Time: 04/01/17 12:40 AM  Result Value Ref Range Status   Specimen Description BLOOD LEFT HAND  Final   Special Requests IN PEDIATRIC BOTTLE Blood Culture adequate volume  Final   Culture   Final    NO GROWTH 2 DAYS Performed at [facility], [place] [address] [place], [place] [address]    Report Status PENDING  Incomplete  Culture, Urine     Status: Abnormal   Collection Time:  04/01/17  1:11 PM  Result Value Ref Range Status   Specimen Description URINE, RANDOM  Final   Special Requests   Final    NONE Performed at [facility], [place] [address], [place], [place] [address]    Culture (A)  Final    >=100,000 COLONIES/mL ESCHERICHIA COLI 50,000 COLONIES/mL ENTEROBACTER SPECIES    Report Status 04/04/2017 FINAL  Final   Organism ID, Bacteria ESCHERICHIA COLI (A)  Final   Organism ID, Bacteria ENTEROBACTER SPECIES (A)  Final      Susceptibility   Escherichia coli - MIC*    AMPICILLIN >=32 RESISTANT Resistant     CEFAZOLIN 16 SENSITIVE Sensitive     CEFTRIAXONE <=1 SENSITIVE Sensitive     CIPROFLOXACIN <=0.25 SENSITIVE Sensitive     GENTAMICIN <=1 SENSITIVE Sensitive     IMIPENEM <=0.25 SENSITIVE Sensitive     NITROFURANTOIN <=16 SENSITIVE Sensitive     TRIMETH/SULFA <=20 SENSITIVE Sensitive     AMPICILLIN/SULBACTAM 16 INTERMEDIATE Intermediate     PIP/TAZO <=4 SENSITIVE Sensitive     Extended ESBL NEGATIVE Sensitive     * >=100,000 COLONIES/mL ESCHERICHIA COLI   Enterobacter species - MIC*    CEFAZOLIN >=64 RESISTANT Resistant     CEFTRIAXONE <=1 SENSITIVE Sensitive     CIPROFLOXACIN <=0.25 SENSITIVE Sensitive     GENTAMICIN <=1 SENSITIVE Sensitive     IMIPENEM 1 SENSITIVE Sensitive     NITROFURANTOIN 64 INTERMEDIATE Intermediate     TRIMETH/SULFA <=20 SENSITIVE Sensitive     PIP/TAZO <=4 SENSITIVE Sensitive     * 50,000 COLONIES/mL ENTEROBACTER SPECIES    RADIOLOGY STUDIES/RESULTS: Dg Chest 2 View  Result Date: 03/21/2017 CLINICAL DATA:  Right-sided weakness. EXAM: CHEST  2 VIEW COMPARISON:  None. FINDINGS: AP and lateral views of the chest were obtained. The lungs are clear without focal pneumonia, edema, pneumothorax or pleural effusion. Minimal atelectasis noted left base. The cardiopericardial silhouette is within normal limits for size. The visualized bony structures of the thorax are intact. Telemetry leads overlie the chest. IMPRESSION:  No active cardiopulmonary disease. Electronically Signed  By: Misty Stanley M.D.   On: 03/21/2017 13:08   Dg Cervical Spine 1 View  Result Date: 03/25/2017 CLINICAL DATA:  Cervical spine fusion. EXAM: DG C-ARM 61-120 MIN; DG CERVICAL SPINE - 1 VIEW COMPARISON:  03/23/2017. FINDINGS: Lower cervical anterior and interbody fusion. Hardware intact. Anatomic alignment. Surgical sponges noted over the lower anterior neck. IMPRESSION: Lower cervical anterior interbody fusion with anatomic alignment. Electronically Signed   By: Marcello Moores  Register   On: 03/25/2017 15:47   Ct Head Wo Contrast  Result Date: 03/31/2017 CLINICAL DATA:  Initial evaluation for generalized weakness, most prevalent within the right lower extremity. EXAM: CT HEAD WITHOUT CONTRAST TECHNIQUE: Contiguous axial images were obtained from the base of the skull through the vertex without intravenous contrast. COMPARISON:  Prior MRI from 03/23/2017. FINDINGS: Brain: Mildly advanced cerebral atrophy for age. Multiple scattered hypodensities seen throughout the periventricular and deep white matter both cerebral hemispheres, several of which radiate in a perpendicular fashion from the lateral ventricles, again concerning for demyelinating disease. Findings are grossly similar relative to recent MRI. No acute intracranial hemorrhage. No acute large vessel territory infarct. No mass lesion, midline shift or mass effect. No hydrocephalus. No extra-axial fluid collection. Vascular: Scalp soft tissues and calvarium within normal limits. Skull: Globes orbital soft tissues within normal limits. Paranasal sinuses and mastoid air cells are clear. Sinuses/Orbits: None. Other: None. IMPRESSION: 1. Scattered cerebral white-matter hypodensities in a distribution most consistent with underlying demyelinating disease, grossly stable relative to recent MRI. 2. No other new intracranial abnormality identified. Electronically Signed   By: Jeannine Boga M.D.   On:  03/31/2017 19:04   Mr Jeri Cos ZH Contrast  Result Date: 03/23/2017 CLINICAL DATA:  Progressive RIGHT-sided weakness for 7 months. History of hypertension, diabetes. Follow-up cervical spinal cord lesions. EXAM: MRI HEAD WITHOUT AND WITH CONTRAST TECHNIQUE: Multiplanar, multiecho pulse sequences of the brain and surrounding structures were obtained without and with intravenous contrast. CONTRAST:  31m MULTIHANCE GADOBENATE DIMEGLUMINE 529 MG/ML IV SOLN COMPARISON:  None. FINDINGS: INTRACRANIAL CONTENTS: Faint reduced diffusion bilateral occipital lobes, extending toward splenium of corpus callosum with normalized or increased diffusivity. At least 5 subcentimeter posterior fossa lesions. 4 mm T2 bright lesion ventral spinal cord. Far greater than 10 supratentorial white matter lesions, some of which correspond to diffusion abnormality, many of which radiate from the peri ventricular margin with low T1 signal seen with black holes of demyelination. Dominant lesion LEFT frontal periventricular white matter measuring 18 mm. Additional juxta cortical and deep white matter lesions. Two 9 mm lesions LEFT thalamus extending to LEFT hypothalamus. 4 mm lesion RIGHT basal ganglia mild parenchymal brain volume loss. No hydrocephalus. Faint marginal enhancement bilateral occipital lobe lesion (coronal 8/28 and 4/28 post contrast T1). No susceptibility artifact to suggest hemorrhage. No midline shift, mass effect. No abnormal extra-axial fluid collections or extra-axial enhancement. VASCULAR: Normal major intracranial vascular flow voids present at skull base. SKULL AND UPPER CERVICAL SPINE: No abnormal sellar expansion. No suspicious calvarial bone marrow signal. Craniocervical junction maintained. SINUSES/ORBITS: The mastoid air-cells and included paranasal sinuses are well-aerated.T2 bright signal LEFT optic nerve intracanalicular segment suspected, no definite enhancement though limited assessment. OTHER: None.  IMPRESSION: 1. At least 5 infratentorial and greater than 10 supratentorial lesions consistent with chronic demyelination, with acute to subacute enhancing lesions bilateral occipital lobes. Subcentimeter LEFT thalamus and RIGHT basal ganglia demyelinating plaques. 2. Probable LEFT optic neuritis. 3. Mild parenchymal brain volume loss for age. Electronically Signed   By: CElon Alas  M.D.   On: 03/23/2017 22:20   Mr Lumbar Spine Wo Contrast  Result Date: 03/21/2017 CLINICAL DATA:  Increasing leg weakness.  Urinary incontinence. EXAM: MRI LUMBAR SPINE WITHOUT CONTRAST TECHNIQUE: Multiplanar, multisequence MR imaging of the lumbar spine was performed. No intravenous contrast was administered. COMPARISON:  None. FINDINGS: Segmentation:  Assumed standard. Alignment: Mild straightening of the lumbar lordosis. Sagittal alignment is maintained. Vertebrae: Diffusely heterogeneous marrow signal throughout the lumbar spine with multifocal areas of abnormal marrow replacement. Similar appearing marrow signal abnormality is seen in the visualized sacrum, bilateral iliac bones, and bilateral lower thoracic ribs. Conus medullaris and cauda equina: Conus extends to the L1-L2 level. Patchy abnormal signal within the visualized lower thoracic cord. Clumping of the proximal cauda equina nerve roots due to downstream stenosis. Paraspinal and other soft tissues: Negative. Disc levels: T10-T11: Right lateral and anterolateral disc osteophyte complex. No stenosis. T10-T11: Right lateral disc osteophyte complex.  No stenosis. T12-L1:  Negative. L1-L2:  Negative. L2-L3: Diffuse disc bulge with superimposed large central disc protrusion and mild left greater than right facet arthropathy resulting in severe central spinal canal stenosis. No neuroforaminal stenosis. L3-L4: Diffuse disc bulge with superimposed central and left paracentral disc protrusion. Mild bilateral facet arthropathy. Severe central spinal canal stenosis and  moderate left neuroforaminal stenosis. L4-L5: Diffuse disc bulge, slightly asymmetric to the left. Moderate bilateral facet arthropathy. Severe bilateral lateral recess stenosis. Moderate central spinal canal stenosis. Mild to moderate bilateral neuroforaminal stenosis. L5-S1: Diffuse disc bulge with superimposed central disc protrusion. Mild narrowing of the bilateral lateral recesses. Disc material abuts the descending conjoined left S1 and S2 nerve roots. Mild left and severe right neuroforaminal stenosis. IMPRESSION: 1. Diffusely heterogeneous marrow signal throughout the visualized thoracolumbar spine, pelvis, and lower thoracic ribs, concerning for multiple myeloma or metastatic disease. 2. Patchy, abnormal signal throughout the visualized lower thoracic spinal cord, nonspecific. Recommend further evaluation with MRI of the cervical and thoracic spine with and without contrast. 3. Multilevel degenerative changes throughout the lumbar spine with severe central spinal canal stenosis at L2-L3 and L3-L4, likely accounting for the patient's symptoms. 4. Severe right neuroforaminal stenosis at L5-S1. Moderate left neuroforaminal stenosis at L3-L4. Electronically Signed   By: Titus Dubin M.D.   On: 03/21/2017 16:27   Mr Cervical Spine W Wo Contrast  Addendum Date: 04/01/2017   ADDENDUM REPORT: 04/01/2017 12:08 ADDENDUM: Critical Value/emergent results were called by telephone at the time of interpretation on 04/01/2017 at 1150 hours to Dr. Oren Binet , who verbally acknowledged these results. He advises that the patient's hemoglobin value has trended down 3 points in the past four days, arguing in favor of the abnormal fluid collection in this study representing hematoma. We discussed, however, that the possibility of abscess is not excluded by imaging. Electronically Signed   By: Genevie Ann M.D.   On: 04/01/2017 12:08   Result Date: 04/01/2017 CLINICAL DATA:  52 year old male postoperative day 7 status  post C6 corpectomy, anterior cervical spine instrumentation and ACDF spanning C5-C7 4 decompression of spinal cord and exiting nerve roots. Fever overnight, bilateral lower extremity weakness. Evidence of diffuse demyelinating disease in the brain and spine. Status post a course of steroids. History of type 2 diabetes, morbid obesity. EXAM: MRI CERVICAL SPINE WITHOUT AND WITH CONTRAST TECHNIQUE: Multiplanar and multiecho pulse sequences of the cervical spine, to include the craniocervical junction and cervicothoracic junction, were obtained without and with intravenous contrast. CONTRAST:  76m MULTIHANCE GADOBENATE DIMEGLUMINE 529 MG/ML IV SOLN COMPARISON:  Preoperative  cervical spine MRI 03/21/2017. FINDINGS: Alignment: Straightening of the cervical spine. No spondylolisthesis. Vertebrae: Hardware susceptibility artifact from the C5 to the C7 level. Evidence of anterior corpectomy of C6. Underlying diffuse abnormal bone marrow signal throughout the visible spine. No marrow edema or evidence of acute osseous abnormality. Cord: New confluent abnormal T2 and STIR hyperintensity within the cervical spinal cord from the C3-C4 to the C5-C6 cord level. This has the appearance of holo cord edema on sagittal T2 series 4, image 10. There were superimposed multifocal patchy T2 and STIR hyperintense cord lesions, and 1 such lesion is redemonstrated at the C6-C7 cord level just inferior to the holo cord abnormality. Following contrast there is only faint residual intramedullary enhancement corresponding to the C6-C7 lesion. There is questionable mild increased leptomeningeal enhancement of the cord above C6-C7. The spinal cord below C7 and above the C2 body appears normal. Stable visible brainstem with small medullary and pontine nonenhancing lesions. Posterior Fossa, vertebral arteries, paraspinal tissues: There is a large prevertebral fluid collection tracking inferiorly from the C4 level into the upper thorax, and tracking  laterally to the right at the C5-C6 level probably along the surgical approach. In the upper thorax the collection then tracks posteriorly into the right lateral paraspinal soft tissues. The collection is best demonstrated on postcontrast images series 11, image 10 series 13, image 20 and series 14, images 10-13. The entire collection encompasses 29 by at least 150 x 84 millimeters, and has an estimated volume of greater than 100 mL. The collection is mildly rim enhancing, but dark on all sequences with small areas of internal T2 and STIR hyperintensity. I favor this is postoperative hematoma. Subsequently there is anterior mass effect on the trachea and esophagus, but no significant airway narrowing is demonstrated. The cervical esophagus appears severely affected. The prevertebral soft tissue mass effect continues to the thoracic inlet. Disc levels: The C2-C3 level is unchanged.  The C7-T1 level is unchanged. From C3-C4 through C6-C7 there is progressive spinal stenosis which appears related to a ventral epidural space occupying lesion tracking linearly posterior to the vertebral bodies. This measures up to 3-4 millimeters in thickness and appears maximal at the C5 and C6 levels. It has similar decreased signal on all sequences suggesting hematoma. Axial images demonstrate this new space-occupying process best on series 8 images 7and 11, and series 9, image 14. Associated increased spinal cord mass effect at these levels felt related to the new abnormal cord signal. IMPRESSION: 1. New spinal cord compression and suspected cord edema from the C3 level through the C6 level appears related to material in the ventral epidural space which is favored to be hematoma (see #2). New holo-cord edema from the C3 to the C6 cord levels, superimposed on several preexisting demyelinating spinal cord lesions. 2. Large prevertebral fluid collection tracking from C4 into the upper thoracic spine, and dissecting through the right  lateral neck likely along the surgical approach. In the upper thorax the collection continues and tracks into the right lateral paraspinal space. The fluid is dark on all sequences with mild rim enhancement, and favored to be a large hematoma with estimated volume exceeding 100 mL. 3. Associated regional mass effect on the neck and thoracic inlet soft tissues. No significant airway narrowing is demonstrated. 4. Postoperative changes C5 through C7 including partial C6 corpectomy. Electronically Signed: By: Genevie Ann M.D. On: 04/01/2017 11:43   Mr Cervical Spine W Or Wo Contrast  Result Date: 03/21/2017 CLINICAL DATA:  Bilateral lower extremity  weakness. Urinary incontinence. Abnormal distal spinal cord on lumbar spine MRI today. EXAM: MRI CERVICAL AND THORACIC SPINE WITHOUT AND WITH CONTRAST TECHNIQUE: Multiplanar and multiecho pulse sequences of the cervical spine, to include the craniocervical junction and cervicothoracic junction, and thoracic spine, were obtained without and with intravenous contrast. CONTRAST:  67m MULTIHANCE GADOBENATE DIMEGLUMINE 529 MG/ML IV SOLN COMPARISON:  Lumbar spine MRI today FINDINGS: MRI CERVICAL SPINE FINDINGS The study is moderately motion degraded. Alignment: Cervical spine straightening.  No significant listhesis. Vertebrae: Diffuse heterogeneous replacement of normal marrow signal throughout the spine as described on prior lumbar MRI. No dominant enhancing lesion. Preserved vertebral body heights without evidence of fracture. Cord: There is abnormal T2 hyperintensity in the right lateral spinal cord at C3-4 extending over a length of approximately 1.5 cm. Smaller areas of abnormal T2 signal are present at the superior C6 endplate level and at the C6-7 disc space level, with the latter demonstrating enhancement. Posterior Fossa, vertebral arteries, paraspinal tissues: Discrete 4 mm T2 hyperintense nonenhancing lesion in the medulla. Additional T2 hyperintense lesions in the  pons and left cerebellum. Disc levels: The cervical spinal canal is small in caliber diffusely on a congenital basis. C2-3: Negative. C3-4: Shallow central disc protrusion without significant stenosis. C4-5: Mild disc bulging and uncovertebral spurring result in mild spinal stenosis and moderate bilateral neural foraminal stenosis. C5-6: Broad-based posterior disc osteophyte complex results in moderate spinal stenosis with mild cord flattening and moderate to severe bilateral neural foraminal stenosis. C6-7: Broad-based posterior disc osteophyte complex and central disc extrusion result in severe spinal stenosis with moderate cord flattening and severe bilateral neural foraminal stenosis. C7-T1: Mild uncovertebral spurring results in at most mild bilateral neural foraminal stenosis. No spinal stenosis. MRI THORACIC SPINE FINDINGS The study is mildly motion degraded. Alignment: Normal. Vertebrae: Diffuse heterogeneous replacement of normal marrow signal throughout the spine and ribs as well as visualized clavicles and sternum. No dominant enhancing lesion. Preserved vertebral body heights without evidence of fracture. Cord: As seen on today's earlier lumbar MRI, there is patchy T2 hyperintensity in the distal thoracic spinal cord, with the most discrete lesion being on the left at T12. There is a small T2 hyperintense focus in the left central cord at T6-7. A small lesion versus artifact is questioned at T3. There is the suggestion of focal enhancement along the midline dorsal cord at T5 on axial images (series 21, image 21), however this is not confirmed on sagittal images. Paraspinal and other soft tissues: Unremarkable. Disc levels: Endplate spurring results in mild left neural foraminal stenosis at T1-2. A right posterolateral/foraminal disc osteophyte at T2-3 results in mild right neural foraminal stenosis. A shallow left paracentral disc osteophyte complex and left facet hypertrophy at T6-7 result in mild  spinal stenosis and mild left neural foraminal stenosis. A slightly larger left paracentral disc osteophyte complex at T7-8 combines with facet hypertrophy to result in mild spinal stenosis. There is at most mild disc bulging more inferiorly in the thoracic spine without significant stenosis. IMPRESSION: 1. Scattered T2 hyperintense lesions throughout the cervical and thoracic spinal cord as well as in the pons, medulla, and cerebellum. A lesion at C6-7 is enhancing. While some lesions are associated with spinal stenosis and could in isolation reflect spondylotic myelopathy, the presence of lesions elsewhere in the cord and posterior fossa are indicative of a more widespread process such as demyelinating disease. Infection, inflammatory conditions such as sarcoidosis, and metastatic disease are additional considerations. Brain MRI and CSF analysis are recommended.  2. 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 3. Mild spinal stenosis at T6-7 and T7-8 due to disc and facet degeneration. 4. Diffusely abnormal bone marrow signal. Considerations include multiple myeloma, metastatic disease, and other infiltrative/myelofibrotic marrow processes. Electronically Signed   By: Logan Bores M.D.   On: 03/21/2017 21:44   Mr Thoracic Spine W Wo Contrast  Result Date: 03/21/2017 CLINICAL DATA:  Bilateral lower extremity weakness. Urinary incontinence. Abnormal distal spinal cord on lumbar spine MRI today. EXAM: MRI CERVICAL AND THORACIC SPINE WITHOUT AND WITH CONTRAST TECHNIQUE: Multiplanar and multiecho pulse sequences of the cervical spine, to include the craniocervical junction and cervicothoracic junction, and thoracic spine, were obtained without and with intravenous contrast. CONTRAST:  33m MULTIHANCE GADOBENATE DIMEGLUMINE 529 MG/ML IV SOLN COMPARISON:  Lumbar spine MRI today FINDINGS: MRI CERVICAL SPINE FINDINGS The study is moderately motion degraded.  Alignment: Cervical spine straightening.  No significant listhesis. Vertebrae: Diffuse heterogeneous replacement of normal marrow signal throughout the spine as described on prior lumbar MRI. No dominant enhancing lesion. Preserved vertebral body heights without evidence of fracture. Cord: There is abnormal T2 hyperintensity in the right lateral spinal cord at C3-4 extending over a length of approximately 1.5 cm. Smaller areas of abnormal T2 signal are present at the superior C6 endplate level and at the C6-7 disc space level, with the latter demonstrating enhancement. Posterior Fossa, vertebral arteries, paraspinal tissues: Discrete 4 mm T2 hyperintense nonenhancing lesion in the medulla. Additional T2 hyperintense lesions in the pons and left cerebellum. Disc levels: The cervical spinal canal is small in caliber diffusely on a congenital basis. C2-3: Negative. C3-4: Shallow central disc protrusion without significant stenosis. C4-5: Mild disc bulging and uncovertebral spurring result in mild spinal stenosis and moderate bilateral neural foraminal stenosis. C5-6: Broad-based posterior disc osteophyte complex results in moderate spinal stenosis with mild cord flattening and moderate to severe bilateral neural foraminal stenosis. C6-7: Broad-based posterior disc osteophyte complex and central disc extrusion result in severe spinal stenosis with moderate cord flattening and severe bilateral neural foraminal stenosis. C7-T1: Mild uncovertebral spurring results in at most mild bilateral neural foraminal stenosis. No spinal stenosis. MRI THORACIC SPINE FINDINGS The study is mildly motion degraded. Alignment: Normal. Vertebrae: Diffuse heterogeneous replacement of normal marrow signal throughout the spine and ribs as well as visualized clavicles and sternum. No dominant enhancing lesion. Preserved vertebral body heights without evidence of fracture. Cord: As seen on today's earlier lumbar MRI, there is patchy T2  hyperintensity in the distal thoracic spinal cord, with the most discrete lesion being on the left at T12. There is a small T2 hyperintense focus in the left central cord at T6-7. A small lesion versus artifact is questioned at T3. There is the suggestion of focal enhancement along the midline dorsal cord at T5 on axial images (series 21, image 21), however this is not confirmed on sagittal images. Paraspinal and other soft tissues: Unremarkable. Disc levels: Endplate spurring results in mild left neural foraminal stenosis at T1-2. A right posterolateral/foraminal disc osteophyte at T2-3 results in mild right neural foraminal stenosis. A shallow left paracentral disc osteophyte complex and left facet hypertrophy at T6-7 result in mild spinal stenosis and mild left neural foraminal stenosis. A slightly larger left paracentral disc osteophyte complex at T7-8 combines with facet hypertrophy to result in mild spinal stenosis. There is at most mild disc bulging more inferiorly in the thoracic spine without significant stenosis. IMPRESSION: 1. Scattered T2 hyperintense  lesions throughout the cervical and thoracic spinal cord as well as in the pons, medulla, and cerebellum. A lesion at C6-7 is enhancing. While some lesions are associated with spinal stenosis and could in isolation reflect spondylotic myelopathy, the presence of lesions elsewhere in the cord and posterior fossa are indicative of a more widespread process such as demyelinating disease. Infection, inflammatory conditions such as sarcoidosis, and metastatic disease are additional considerations. Brain MRI and CSF analysis are recommended. 2. 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 3. Mild spinal stenosis at T6-7 and T7-8 due to disc and facet degeneration. 4. Diffusely abnormal bone marrow signal. Considerations include multiple myeloma, metastatic disease, and other infiltrative/myelofibrotic marrow  processes. Electronically Signed   By: Logan Bores M.D.   On: 03/21/2017 21:44   Dg Chest Port 1v Same Day  Result Date: 04/01/2017 CLINICAL DATA:  Shortness of breath, atrial fibrillation, diabetes, multiple scleroses. EXAM: PORTABLE CHEST 1 VIEW COMPARISON:  Chest x-ray of March 21, 2017 FINDINGS: The lungs are well-expanded. There is subtle increased interstitial density in the lower third of the right lung. The heart and pulmonary vascularity are normal. The mediastinum is normal in width with exception of soft tissue fullness in the right paratracheal region. IMPRESSION: Probable subsegmental atelectasis at the right lung base.  No CHF. Soft tissue fullness in the right paratracheal region is more conspicuous than on the previous study. This may reflect lymphadenopathy, intrathoracic border, or vascular structure. Given the patient's lower anterior cervical fusion 1 week ago, hematoma or infection may be present. Contrast-enhanced CT scanning of the neck and chest is recommended. Electronically Signed   By: David  Martinique M.D.   On: 04/01/2017 08:10   Dg C-arm 1-60 Min  Result Date: 03/25/2017 CLINICAL DATA:  Cervical spine fusion. EXAM: DG C-ARM 61-120 MIN; DG CERVICAL SPINE - 1 VIEW COMPARISON:  03/23/2017. FINDINGS: Lower cervical anterior and interbody fusion. Hardware intact. Anatomic alignment. Surgical sponges noted over the lower anterior neck. IMPRESSION: Lower cervical anterior interbody fusion with anatomic alignment. Electronically Signed   By: Marcello Moores  Register   On: 03/25/2017 15:47   [place] Ekg Site Rite  Result Date: 04/03/2017 If Site Rite image not attached, placement could not be confirmed due to current cardiac rhythm.  Dg Fluoro Guide Lumbar Puncture  Result Date: 03/22/2017 CLINICAL DATA:  Lower extremity weakness. Spinal cord lesions on MRI. Severe multilevel lumbar stenosis. EXAM: DIAGNOSTIC LUMBAR PUNCTURE UNDER FLUOROSCOPIC GUIDANCE FLUOROSCOPY TIME:  Fluoroscopy Time:  0  minutes, 42 seconds Radiation Exposure Index (if provided by the fluoroscopic device): 8.9 mGy Number of Acquired Spot Images: 0 PROCEDURE: I discussed the risks (including hemorrhage, infection, headache, and nerve damage, among others), benefits, and alternatives to fluoroscopically guided lumbar puncture with the patient. We specifically discussed the high technical likelihood of success of the procedure. The patient understood and elected to undergo the procedure. Prior to the procedure I reviewed of the patient's lumbar spine MRI and determined that L5-S1 was the optimum level. Standard time-out was employed. Following sterile skin prep and local anesthetic administration consisting of 1 percent lidocaine, a 20 gauge spinal needle was advanced without difficulty into the thecal sac at the at the L5-S1 level from a slightly paramedian approach. Clear CSF was returned. Opening pressure was not obtained. 15 cc of clear CSF was collected. The needle was subsequently removed and the skin cleansed and bandaged. No immediate complications were observed. IMPRESSION: 1. Technically successful fluoroscopically lumbar puncture  at the L5-S1 level yielding 15 cc of clear CSF. Electronically Signed   By: Van Clines M.D.   On: 03/22/2017 17:10     LOS: 36 days   Oren Binet, MD  [facility] Pager:[phone]  If 7PM-7AM, please contact night-coverage [email or web address] Password [email or web address] 04/04/2017, 1:30 PM

## 2017-04-04 NOTE — Plan of Care (Signed)
  Clinical Measurements: Ability to maintain clinical measurements within normal limits will improve 04/04/2017 2049 - Progressing by Luther Redo, RN   Clinical Measurements: Cardiovascular complication will be avoided 04/04/2017 2049 - Progressing by Luther Redo, RN

## 2017-04-04 NOTE — Progress Notes (Signed)
INFECTIOUS DISEASE PROGRESS NOTE  ID: Dylan [name] is a 52 y.o. 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   Abtx:  Anti-infectives (From admission, onward)   Start     Dose/Rate Route Frequency Ordered Stop   04/04/17 0800  vancomycin (VANCOCIN) IVPB 1000 mg/200 mL premix     1,000 mg 200 mL/hr over 60 Minutes Intravenous Every 12 hours 04/03/17 1449     04/01/17 1500  vancomycin (VANCOCIN) 1,500 mg in sodium chloride 0.9 % 500 mL IVPB     1,500 mg 250 mL/hr over 120 Minutes Intravenous Every 24 hours 04/01/17 1326 04/04/17 0700   04/01/17 1400  ceFEPIme (MAXIPIME) 2 g in sodium chloride 0.9 % 100 mL IVPB     2 g 200 mL/hr over 30 Minutes Intravenous Every 12 hours 04/01/17 1326     03/25/17 1900  ceFAZolin (ANCEF) IVPB 2g/100 mL premix     2 g 200 mL/hr over 30 Minutes Intravenous Every 8 hours 03/25/17 1830 03/26/17 2227   03/25/17 1404  bacitracin 50,000 Units in sodium chloride irrigation 0.9 % 500 mL irrigation  Status:  Discontinued       As needed 03/25/17 1404 03/25/17 1551   03/25/17 1200  ceFAZolin (ANCEF) 3 g in dextrose 5 % 50 mL IVPB     3 g 130 mL/hr over 30 Minutes Intravenous 30 min pre-op 03/24/17 1535 03/25/17 1325      Medications:  Scheduled: . amiodarone  200 mg Oral BID  . B-complex with vitamin C  1 tablet Oral Daily  . dexamethasone  4 mg Intravenous Q6H  . diltiazem  30 mg Oral Q8H  . insulin aspart  0-20 Units Subcutaneous TID WC  . insulin aspart  0-5 Units Subcutaneous QHS  . insulin aspart  6 Units Subcutaneous TID WC  . insulin glargine  48  Units Subcutaneous QHS  . pantoprazole  40 mg Oral Daily  . sodium chloride flush  10-40 mL Intracatheter Q12H  . sodium chloride flush  3 mL Intravenous Q12H    Objective: Vital signs in last 24 hours: Temp:  [98.3 F (36.8 C)-98.6 F (37 C)] 98.6 F (37 C) (03/03 0736) Pulse Rate:  [71-75] 75 (03/03 0736) Resp:  [18-19] 18 (03/03 0736) BP: (128-147)/(73-80) 141/77 (03/03 0736) SpO2:  [93 %-100 %] 98 % (03/03 0736)   General appearance: alert, cooperative and no distress Resp: clear to auscultation bilaterally Cardio: regular rate and rhythm GI: normal findings: bowel sounds normal and soft, non-tender  Lab Results Recent Labs    04/03/17 0233 04/04/17 0628  WBC 19.1* 13.5*  HGB 11.1* 10.6*  HCT 31.9* 31.5*  NA 143 143  K 4.5 4.5  CL 114* 115*  CO2 21* 21*  BUN 46* 50*  CREATININE 1.36* 1.55*   Liver Panel Recent Labs    04/02/17 0403  PROT 5.7*  ALBUMIN 2.1*  AST 18  ALT 31  ALKPHOS 43  BILITOT 0.8   Sedimentation Rate No results for input(s): ESRSEDRATE in the last 72 hours. C-Reactive Protein No results for input(s): CRP in the last  72 hours.  Microbiology: Recent Results (from the past 240 hour(s))  Culture, blood (routine x 2)     Status: None (Preliminary result)   Collection Time: 04/01/17 12:30 AM  Result Value Ref Range Status   Specimen Description BLOOD RIGHT ARM  Final   Special Requests IN PEDIATRIC BOTTLE Blood Culture adequate volume  Final   Culture   Final    NO GROWTH 2 DAYS Performed at [facility], [address], [place], [place] [address]    Report Status PENDING  Incomplete  Culture, blood (routine x 2)     Status: None (Preliminary result)   Collection Time: 04/01/17 12:40 AM  Result Value Ref Range Status   Specimen Description BLOOD LEFT HAND  Final   Special Requests IN PEDIATRIC BOTTLE Blood Culture adequate volume  Final   Culture   Final    NO GROWTH 2 DAYS Performed at [facility], [place] [address], [place], [place] [address]    Report Status PENDING  Incomplete  Culture, Urine     Status: Abnormal   Collection Time: 04/01/17  1:11 PM  Result Value Ref Range Status   Specimen Description URINE, RANDOM  Final   Special Requests   Final    NONE Performed at [facility], [address], [place], [place] [address]    Culture (A)  Final    >=100,000 COLONIES/mL ESCHERICHIA COLI 50,000 COLONIES/mL ENTEROBACTER SPECIES    Report Status 04/04/2017 FINAL  Final   Organism ID, Bacteria ESCHERICHIA COLI (A)  Final   Organism ID, Bacteria ENTEROBACTER SPECIES (A)  Final      Susceptibility   Escherichia coli - MIC*    AMPICILLIN >=32 RESISTANT Resistant     CEFAZOLIN 16 SENSITIVE Sensitive     CEFTRIAXONE <=1 SENSITIVE Sensitive     CIPROFLOXACIN <=0.25 SENSITIVE Sensitive     GENTAMICIN <=1 SENSITIVE Sensitive     IMIPENEM <=0.25 SENSITIVE Sensitive     NITROFURANTOIN <=16 SENSITIVE Sensitive     TRIMETH/SULFA <=20 SENSITIVE Sensitive     AMPICILLIN/SULBACTAM 16 INTERMEDIATE Intermediate     PIP/TAZO <=4 SENSITIVE Sensitive     Extended ESBL NEGATIVE Sensitive     * >=100,000 COLONIES/mL ESCHERICHIA COLI   Enterobacter species - MIC*    CEFAZOLIN >=64 RESISTANT Resistant     CEFTRIAXONE <=1 SENSITIVE Sensitive     CIPROFLOXACIN <=0.25 SENSITIVE Sensitive     GENTAMICIN <=1 SENSITIVE Sensitive     IMIPENEM 1 SENSITIVE Sensitive     NITROFURANTOIN 64 INTERMEDIATE Intermediate     TRIMETH/SULFA <=20 SENSITIVE Sensitive     PIP/TAZO <=4 SENSITIVE Sensitive     * 50,000 COLONIES/mL ENTEROBACTER SPECIES    Studies/Results: [place] Ekg Site Rite  Result Date: 04/03/2017 If Site Rite image not attached, placement could not be confirmed due to current cardiac rhythm.    Assessment/Plan: Post op cervical abscess vs hematoma DM2 UCx E coli (R- amp, I- unasyn), Enterobacter (pending) AKI MS- new dx   Total days of antibiotics: 2 vanco/cefepime  Would aim for 42 days of  therapy.  Can see [place] in ID clinic for f/u.  He has [facility] Will place OPAT order with pharm.  Available as needed.    No Known Allergies  OPAT Orders Discharge antibiotics:vanco, cefepime Per pharmacy protocol: vanco Aim for Vancomycin trough 15-20 (unless otherwise indicated) Duration: 42 days End Date: May 16, 2017  [facility] Care Per Protocol:  Labs weekly while on  IV antibiotics: _x_ CBC with differential __ BMP x__ CMP _x_ CRP _x_ ESR _x_ Vancomycin trough  _x_ Please pull PIC at completion of IV antibiotics __ Please leave PIC in place until doctor has seen patient or been notified  Fax weekly labs to [phone]  Clinic Follow Up Appt: [place] 4-6 weeks         Bobby Rumpf MD, FACP Infectious Diseases (pager) [phone] [email or web address] 04/04/2017, 12:43 PM  LOS: 14 days

## 2017-04-04 NOTE — Progress Notes (Signed)
Subjective:  No cardiac complaints today.  Still complains of weakness.  Objective:  Vital Signs in the last 24 hours: BP (!) 141/77 (BP Location: Left Arm)   Pulse 75   Temp 98.6 F (37 C) (Oral)   Resp 18   Ht 6\' 4"  (1.93 m)   Wt 132.4 kg (291 lb 14.2 oz)   SpO2 98%   BMI 35.53 kg/m   Physical Exam: obese black male in no acute distress Lungs:  Clear  Cardiac:  Regular rhythm, normal S1 and S2, no S3 Extremities:  No edema present  Intake/Output from previous day: 03/02 0701 - 03/03 0700 In: 2489.2 [P.O.:600; I.V.:1689.2; IV Piggyback:200] Out: 2650 [Urine:2650] Weight Filed Weights   03/22/17 0839 03/31/17 0457 04/03/17 0434  Weight: 130.9 kg (288 lb 9.3 oz) (!) 136.7 kg (301 lb 5.9 oz) 132.4 kg (291 lb 14.2 oz)    Lab Results: Basic Metabolic Panel: Recent Labs    04/03/17 0233 04/04/17 0628  NA 143 143  K 4.5 4.5  CL 114* 115*  CO2 21* 21*  GLUCOSE 242* 279*  BUN 46* 50*  CREATININE 1.36* 1.55*    CBC: Recent Labs    04/02/17 0403 04/03/17 0233 04/04/17 0628  WBC 27.0* 19.1* 13.5*  NEUTROABS 22.9*  --   --   HGB 11.5* 11.1* 10.6*  HCT 33.3* 31.9* 31.5*  MCV 87.2 88.1 88.0  PLT 216 229 233   Telemetry: Currently sinus rhythm, no recurrence of atrial fib/flutter Assessment/Plan:  1.  Paroxysmal atrial fibrillation and flutter 2.  Hypertension 3.  Diabetes  Recommendations:  Changed to oral amiodarone today.  At this point  not a good candidate for anticoagulation in light of spinal hematoma.    Darden Palmer  MD [facility] Cardiology  04/04/2017, 10:47 AM

## 2017-04-04 NOTE — Plan of Care (Signed)
  Clinical Measurements: Ability to maintain clinical measurements within normal limits will improve 04/04/2017 0115 - Progressing by Luther Redo, RN   Education: Knowledge of General Education information will improve 04/04/2017 0115 - Progressing by Luther Redo, RN

## 2017-04-05 DIAGNOSIS — Z515 Encounter for palliative care: Secondary | ICD-10-CM

## 2017-04-05 LAB — BASIC METABOLIC PANEL
Anion gap: 9 (ref 5–15)
BUN: 44 mg/dL — AB (ref 6–20)
CHLORIDE: 111 mmol/L (ref 101–111)
CO2: 20 mmol/L — AB (ref 22–32)
CREATININE: 1.58 mg/dL — AB (ref 0.61–1.24)
Calcium: 8 mg/dL — ABNORMAL LOW (ref 8.9–10.3)
GFR calc Af Amer: 58 mL/min — ABNORMAL LOW (ref >60)
GFR calc non Af Amer: 50 mL/min — ABNORMAL LOW (ref >60)
Glucose, Bld: 239 mg/dL — ABNORMAL HIGH (ref 65–99)
Potassium: 4.7 mmol/L (ref 3.5–5.1)
SODIUM: 140 mmol/L (ref 135–145)

## 2017-04-05 LAB — CBC
HEMATOCRIT: 30.2 % — AB (ref 39.0–52.0)
HEMOGLOBIN: 10.3 g/dL — AB (ref 13.0–17.0)
MCH: 29.9 pg (ref 26.0–34.0)
MCHC: 34.1 g/dL (ref 30.0–36.0)
MCV: 87.5 fL (ref 78.0–100.0)
Platelets: 220 10*3/uL (ref 150–400)
RBC: 3.45 MIL/uL — ABNORMAL LOW (ref 4.22–5.81)
RDW: 13.4 % (ref 11.5–15.5)
WBC: 13.7 10*3/uL — ABNORMAL HIGH (ref 4.0–10.5)

## 2017-04-05 LAB — GLUCOSE, CAPILLARY
GLUCOSE-CAPILLARY: 196 mg/dL — AB (ref 65–99)
Glucose-Capillary: 224 mg/dL — ABNORMAL HIGH (ref 65–99)
Glucose-Capillary: 196 mg/dL — ABNORMAL HIGH (ref 65–99)
Glucose-Capillary: 202 mg/dL — ABNORMAL HIGH (ref 65–99)

## 2017-04-05 LAB — VANCOMYCIN, TROUGH: Vancomycin Tr: 18 ug/mL (ref 15–20)

## 2017-04-05 MED ORDER — INSULIN GLARGINE 100 UNIT/ML ~~LOC~~ SOLN
54.0000 [IU] | Freq: Every day | SUBCUTANEOUS | Status: DC
  Administered 2017-04-05: 54 [IU] via SUBCUTANEOUS
  Filled 2017-04-05: qty 0.54

## 2017-04-05 MED ORDER — VANCOMYCIN HCL IN DEXTROSE 750-5 MG/150ML-% IV SOLN
750.0000 mg | Freq: Two times a day (BID) | INTRAVENOUS | Status: DC
  Administered 2017-04-06 – 2017-04-07 (×4): 750 mg via INTRAVENOUS
  Filled 2017-04-05 (×6): qty 150

## 2017-04-05 MED ORDER — INSULIN GLARGINE 100 UNIT/ML ~~LOC~~ SOLN: 8.0000 [IU] | Freq: Once | SUBCUTANEOUS | Status: DC

## 2017-04-05 NOTE — Progress Notes (Signed)
PHARMACY CONSULT NOTE FOR:  OUTPATIENT  PARENTERAL ANTIBIOTIC THERAPY (OPAT) 52 y.o. male admitted on 03/21/2017 with possible sepsis vs post-op cervical abscess/infection. Pharmacy has been consulted for vancomycin and cefepime dosing. ID is following.  Patient on day 5 of antibiotics for suspected spinal infection.  Patient suffered AKI that appears to be improving, though SCr has been trending up over the past few days after an intial decrease.  Vancomycin trough drawn tonight was in range at 28mcg/mL, however patient's SCr has been trending up over the past few days.  Plan: Decrease vancomycin to 750mg  IV q12h with next dose Continue Cefepime 2g IV q12h OPAT orders as below    Indication: Spinal Abscess Regimen: Vancomycin 750 mg IV every 12 hours       Cefepime 2 gram IV every 12 hours End date: 05/16/2017  IV antibiotic discharge orders are pended. To discharging provider:  please sign these orders via discharge navigator,  Select New Orders & click on the button choice - Manage This Unsigned Work.      Thank you for allowing pharmacy to be a part of this patient's care.  [name], PharmD, BCPS Clinical Pharmacist 04/05/2017 9:35 PM

## 2017-04-05 NOTE — Consult Note (Signed)
Consultation Note Date: 04/05/2017   Patient Name: [name]  DOB: 07-23-1965  MRN: [id number]  Age / Sex: 52 y.o., male  PCP: Patient, No Pcp Per Referring Physician: Richarda Overlie, MD  Reason for Consultation: Establishing goals of care and Psychosocial/spiritual support  HPI/Patient Profile: 52 y.o. male "[name]"  with past medical history of DM and HTN who was admitted on 03/21/2017 with a sudden inability to walk. He was found to have both C5-C6, C6-C7 cord compression and a new demyelinating disorder on MRI.  He underwent neurosurgery for cord compression and unfortunately developed multiple issues [name] op including new Afib w/ RVR, fever, lethargy, and AKI.  He was found to have a hematoma or possible abscess near the surgical sight that has caused significant weakness from the chest down.  He is also being treated for UTI ([name] & [name]).  Clinical Assessment and Goals of Care:  I have reviewed medical records including EPIC notes, labs and imaging, received report from the care team, assessed the patient and discussed diagnosis, GOC, EOL wishes, disposition and options.  I introduced Palliative Medicine as specialized medical care for people living with serious illness. It focuses on providing relief from the symptoms and stress of a serious illness. The goal is to improve quality of life for both the patient and the family.  We discussed a brief life review of the patient. He is originally from [name].  He moved to  with his parents who were farmers.  He is an only child.  His parents are now deceased.  He is unmarried and has no children.  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  Essentially he has 6 children that he is responsible for in a manner much like a Child psychotherapist.  He guides the children  and helps to make recommendations about their care/schooling/living arrangements.   As far as functional and nutritional status, he appears to have very significant weakness of all 4 extremities.  His appetite is good and he is eating well.  We discussed his current illness and what it means in the larger context of his on-going co-morbidities.  Natural disease trajectory and expectations at EOL were discussed.  Specifically we discussed demyelinated disorders - progressive as well as relapsing and remitting.  We talked about some of the potential symptoms being bladder dysfunction, depression, pain, weakness, and eventually dysphagia with aspiration.    [name] described a situation at work that still haunts him.  A boy died approximately 1 year ago.  Under anesthesia the boy came to him asking [name] to come with him.  This was traumatizing for [name].  I attempted to elicit values and goals of care important to the patient.  We discussed advanced directives - he named [name] as his HCPOA and [name] as the back up HCPOA.  He does not want life support near end of life and he never wants a PEG  tube for artificial feeding.  Afterward we discussed current code status and he changed his status to DNR.  "If I'm going thru the door, close it" - meaning DNR.  Questions and concerns were addressed.  The family was encouraged to call with questions or concerns.   Dylan [name] is an extremely pleasant person who is somewhat in shock by his new diagnosis and predicament.  He plans to take things one day at a time.  After our meeting I talked with his new HCPOA, [name] and made her aware of his Advance Directive choices as well as his choice to be a DNR.     Primary Decision Maker:  PATIENT    SUMMARY OF RECOMMENDATIONS     Change code status to DNR  Advance Directive completed, Chaplain order placed for Notary.  (Patient will need help signing, but is completely  cognizant).  Needs further education about MS - demyelinating disorders (has a difficult time reading now)  Palliative will sign off.  Please reconsult PMT if we can be of further assistance with this very kind man.  Code Status/Advance Care Planning:  DNR   Additional Recommendations (Limitations, Scope, Preferences):  Full Scope Treatment   Consider outpatient psychological counseling (traumatizing hallucinations -  Likely secondary to anesthesia, depression, critical life change, new illness  Psycho-social/Spiritual:   Desire for further Chaplaincy support: yes  Prognosis: unable to determine    Discharge Planning: Skilled Nursing Facility for rehab with Palliative care service follow-up      Primary Diagnoses: Present on Admission: . Uncontrolled diabetes mellitus type 2 without complications (HCC) . Hypertension . (Resolved) Acquired CNS lesion . (Resolved) Spinal cord lesion (HCC) . Bone lesion . (Resolved) Acute right-sided weakness . Morbid obesity (HCC) . Persistent atrial fibrillation with rapid ventricular response (HCC)   I have reviewed the medical record, interviewed the patient and family, and examined the patient. The following aspects are pertinent.  Past Medical History:  Diagnosis Date  . 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 Diabetes mellitus without complication (HCC)   . Hypertension   . Persistent atrial fibrillation with rapid ventricular response (HCC) 03/27/2017   Social History   Socioeconomic History  . Marital status: Single    Spouse name: None  . Number of children: None  . Years of education: None  . Highest education level: None  Social Needs  . Financial resource strain: None  . Food insecurity - worry: None  . Food insecurity - inability: None  . Transportation needs - medical: None  . Transportation needs - non-medical: None  Occupational History  . None  Tobacco Use  . Smoking status: Never Smoker  . Smokeless tobacco:  Never Used  Substance and Sexual Activity  . Alcohol use: No    Frequency: Never  . Drug use: No  . Sexual activity: None  Other Topics Concern  . None  Social History Narrative  . None   Family History  Problem Relation Age of Onset  . Prostate cancer Father    Scheduled Meds: . amiodarone  200 mg Oral BID  . B-complex with vitamin C  1 tablet Oral Daily  . dexamethasone  3 mg Intravenous Q8H  . diltiazem  30 mg Oral Q8H  . insulin aspart  0-20 Units Subcutaneous TID WC  . insulin aspart  0-5 Units Subcutaneous QHS  . insulin aspart  6 Units Subcutaneous TID WC  . insulin glargine  54 Units Subcutaneous QHS  . insulin glargine  8 Units  Subcutaneous Once  . pantoprazole  40 mg Oral Daily  . sodium chloride flush  10-40 mL Intracatheter Q12H  . sodium chloride flush  3 mL Intravenous Q12H   Continuous Infusions: . sodium chloride 75 mL/hr at 04/05/17 0252  . ceFEPime (MAXIPIME) IV Stopped (04/05/17 0426)  . methocarbamol (ROBAXIN)  IV    . vancomycin Stopped (04/05/17 0946)   PRN Meds:.acetaminophen **OR** acetaminophen, benzonatate, bisacodyl, menthol-cetylpyridinium **OR** phenol, methocarbamol **OR** methocarbamol (ROBAXIN)  IV, metoprolol tartrate, ondansetron **OR** ondansetron (ZOFRAN) IV, senna-docusate, sodium chloride flush No Known Allergies Review of Systems denies pain, SOB, insomnia  Physical Exam  Well developed, overweight male, awake, alert, coherent, speech is clear. Able to move right arm, very weak in right finger tips.  Unable to move other extremities.  Vital Signs: BP 136/79 (BP Location: Left Arm)   Pulse 72   Temp 98 F (36.7 C) (Axillary)   Resp 16   Ht 6\' 4"  (1.93 m)   Wt (!) 139 kg (306 lb 7 oz)   SpO2 100%   BMI 37.30 kg/m  Pain Assessment: No/denies pain POSS *See Group Information*: S-Acceptable,Sleep, easy to arouse Pain Score: 0-No pain   SpO2: SpO2: 100 % O2 Device:SpO2: 100 % O2 Flow Rate: .O2 Flow Rate (L/min): 2  L/min  IO: Intake/output summary:   Intake/Output Summary (Last 24 hours) at 04/05/2017 1323 Last data filed at 04/05/2017 1259 Gross per 24 hour  Intake 4440.32 ml  Output 3875 ml  Net 565.32 ml    LBM: Last BM Date: 04/04/17 Baseline Weight: Weight: (!) 158.8 kg (350 lb) Most recent weight: Weight: (!) 139 kg (306 lb 7 oz)     Palliative Assessment/Data: 30%     Time In: 11:00 Time Out: 12:10 Time Total: 70 min. Greater than 50%  of this time was spent counseling and coordinating care related to the above assessment and plan.  Signed by: Norvel Richards, PA-C Palliative Medicine Pager: [phone]  Please contact Palliative Medicine Team phone at [phone] for questions and concerns.  For individual provider: See Loretha Stapler

## 2017-04-05 NOTE — Progress Notes (Signed)
PROGRESS NOTE        PATIENT DETAILS Name: Dylan [name] Age: 52 y.o. Sex: male Date of Birth: 01/06/1966 Admit Date: 03/21/2017 Admitting Physician Vianne Bulls, MD LKG:[id number], No Pcp Per   Brief Narrative: Patient is a 52 y.o. male with history of DM-2, hypertension who presented with generalized weakness but mostly with progressive weakness in his right leg-patient was evaluated by neurology-and underwent workup which demonstrated cord compression at C5-C6, and C6-C7 level due to severe cervical stenosis, patient was also diagnosed with demyelinating disease (MS vs NMO).  Patient was started on IV steroids, and neurosurgery was consulted, patient underwent corpectomy at C6 with decompression on 2/21.  Further hospital course was complicated by development of atrial fibrillation with RVR.  He was briefly placed on Cardizem infusion, anticoagulation with heparin, once his rate his rate was appropriately controlled- plans to discharge to CIR on 2/26, however patient developed lethargy and discharge was held. However on 2/27, patient was found to have A. fib with rapid ventricular response-requiring initiation of IV amiodarone.  Hospital course now has been complicated by development of fever, leukocytosis and acute kidney injury-unfortunately upon further evaluation with MRI C Spine-which showed either a hematoma or a abscess causing cord compression, subsequently re-evaluated by Neurosurgery, and thought not to be a candidate for repeat surgery given poor chance of any meaningful recovery. See below for further details.   Subjective: Still no signs of improvement in the strength in his legs. Not able to move his lower extremities.  Assessment/Plan: Cervical cord compression: initially felt to be secondary to severe spinal stenosis-underwent corpectomy and decompressive surgery on 2/21, unfortunately hospital course has been complicated by development of either a  hematoma or a epidural abscess causing cord compresion-as seen on MRI C Spine on 2/28. Per Neurosurgery, not felt to be a candidate for repeat surgery given poor chance of any meaningful recovery.  Neuro exam remains unchanged-he continues to have significant weakness in his lower extremities.  Remains on empiric vancomycin and cefepime day 3. As per Dr. Johnnye Sima, 42 days of antibiotic recommended.  He had atrial fibrillation during this hospital stay and was briefly anticoagulated-but given possible findings of spinal hematoma-he is no longer on anticoagulation.  Continue Decadron taper. Very difficult situation-also has multiple sclerosis-per family-patient had been struggling to walk for a few years (walking with a cane with progressive right>> left leg weakness)-and for the past few months had progressive decline in function-and subsequently presented to the ED when he was unable to get out of bed..  Sepsis: Sepsis pathophysiology has essentially resolved.  Likely secondary to a cervical epidural abscess and complicated UTI.  Remains on empiric vancomycin and cefepime, day #3. UCx E coli (R- amp, I- unasyn), Enterobacter (pending). Appreciate infectious disease recommendations-PICC line placed-plans are to continue with vancomycin and cefepime with stop date of May 16, 2017. Follow-up with Dr. Johnnye Sima as outpatient in 4-6 weeks  A. fib with RVR: Continues to be in sinus rhythm-initially required amiodarone and heparin infusion-followed by cardiology-he is maintaining sinus rhythm on oral amiodarone and Cardizem.  Given possible spinal hematoma-no longer on anticoagulation. Taper amiodarone as outlined by Dr.Nasher. Amiodarone 200 mg a day upon discharge. Continue diltiazem 30 mg by mouth every 8. Echocardiogram showed EF of around 50-55%.  Chads 2 Vas score is 2 (DM, HTN).  Acute kidney injury: Baseline  hemoglobin around 1. Likely hemodynamically mediated-in the setting of fever, A. fib RVR, and the use  of ACE inhibitor-usually improved with IV fluids-but seems to have plateaued-continue to hold lisinopril and on other nephrotoxic agents.   Creatinine continues to be high, likely secondary to hyperglycemia, will increase normal saline to 100 mL/h and recheck renal function tomorrow  Newly diagnosed multiple sclerosis: Completed course of IV steroids, plans were to follow-up with [facility] neurology-Dr. Felecia Shelling.  DM 2 with hyperglycemia: CBGs on the higher side-as patient on Decadron-Lantus was increased to 54 units on 3/4, continue to taper Decadron, 6 units of NovoLog with meals and SSI.  Reassess tomorrow and adjust accordingly.    Hypertension: Controlled, continue with  Cardizem.  Morbid obesity Body mass index is 37.3 kg/m.   Telemetry (independently reviewed): Afib with RVR  Echo (reviewed):EF 50-55% on TTE done on 2/26  DVT Prophylaxis: Anticoagulation currently on hold  Code Status: Full code   Family Communication: None at bedside   Disposition Plan: Remain inpatient-hopefully to CIR early next week  Antimicrobial agents: Anti-infectives (From admission, onward)   Start     Dose/Rate Route Frequency Ordered Stop   04/04/17 0800  vancomycin (VANCOCIN) IVPB 1000 mg/200 mL premix     1,000 mg 200 mL/hr over 60 Minutes Intravenous Every 12 hours 04/03/17 1449     04/01/17 1500  vancomycin (VANCOCIN) 1,500 mg in sodium chloride 0.9 % 500 mL IVPB     1,500 mg 250 mL/hr over 120 Minutes Intravenous Every 24 hours 04/01/17 1326 04/04/17 0700   04/01/17 1400  ceFEPIme (MAXIPIME) 2 g in sodium chloride 0.9 % 100 mL IVPB     2 g 200 mL/hr over 30 Minutes Intravenous Every 12 hours 04/01/17 1326     03/25/17 1900  ceFAZolin (ANCEF) IVPB 2g/100 mL premix     2 g 200 mL/hr over 30 Minutes Intravenous Every 8 hours 03/25/17 1830 03/26/17 2227   03/25/17 1404  bacitracin 50,000 Units in sodium chloride irrigation 0.9 % 500 mL irrigation  Status:  Discontinued       As needed  03/25/17 1404 03/25/17 1551   03/25/17 1200  ceFAZolin (ANCEF) 3 g in dextrose 5 % 50 mL IVPB     3 g 130 mL/hr over 30 Minutes Intravenous 30 min pre-op 03/24/17 1535 03/25/17 1325      Procedures: 2/21>> cervical spine decompression 3/2>>PICC  CONSULTS:  cardiology, neurology and Neurosurgery  Time spent: 25  minutes-Greater than 50% of this time was spent in counseling, explanation of diagnosis, planning of further management, and coordination of care.  MEDICATIONS: Scheduled Meds: . amiodarone  200 mg Oral BID  . B-complex with vitamin C  1 tablet Oral Daily  . dexamethasone  3 mg Intravenous Q8H  . diltiazem  30 mg Oral Q8H  . insulin aspart  0-20 Units Subcutaneous TID WC  . insulin aspart  0-5 Units Subcutaneous QHS  . insulin aspart  6 Units Subcutaneous TID WC  . insulin glargine  54 Units Subcutaneous QHS  . insulin glargine  8 Units Subcutaneous Once  . pantoprazole  40 mg Oral Daily  . sodium chloride flush  10-40 mL Intracatheter Q12H  . sodium chloride flush  3 mL Intravenous Q12H   Continuous Infusions: . sodium chloride 75 mL/hr at 04/05/17 0252  . ceFEPime (MAXIPIME) IV Stopped (04/05/17 0426)  . methocarbamol (ROBAXIN)  IV    . vancomycin Stopped (04/05/17 0946)   PRN Meds:.acetaminophen **OR** acetaminophen, benzonatate, bisacodyl,  menthol-cetylpyridinium **OR** phenol, methocarbamol **OR** methocarbamol (ROBAXIN)  IV, metoprolol tartrate, ondansetron **OR** ondansetron (ZOFRAN) IV, senna-docusate, sodium chloride flush   PHYSICAL EXAM: Vital signs: Vitals:   04/05/17 0348 04/05/17 0516 04/05/17 0732 04/05/17 1128  BP: (!) 146/85 (!) 141/85 129/72 136/79  Pulse: 68  70 72  Resp: 20  (!) 21 16  Temp: 98.4 F (36.9 C)   98 F (36.7 C)  TempSrc: Oral   Axillary  SpO2: 99%  100% 100%  Weight: (!) 139 kg (306 lb 7 oz)     Height:       Filed Weights   03/31/17 0457 04/03/17 0434 04/05/17 0348  Weight: (!) 136.7 kg (301 lb 5.9 oz) 132.4 kg (291  lb 14.2 oz) (!) 139 kg (306 lb 7 oz)   Body mass index is 37.3 kg/m.    General appearance :Awake, alert, not in any distress.  Eyes:, pupils equally reactive to light and accomodation,no scleral icterus. HEENT: Atraumatic and Normocephalic Neck: supple, no JVD. Resp:Good air entry bilaterally CVS: S1 S2 regular, no murmurs.  GI: Bowel sounds present, Non tender and not distended with no gaurding, rigidity or rebound. Extremities: B/L Lower Ext shows no edema, both legs are warm to touch Neurology: Left wrist drop appears unchanged, able to lift bilateral arms off the bed.  Still unable to lift bilateral lower extremities off the bed. Psychiatric: Normal judgment and insight. Normal mood. Musculoskeletal:No digital cyanosis Skin:No Rash, warm and dry Wounds:N/A  I have personally reviewed following labs and imaging studies  LABORATORY DATA: CBC: Recent Labs  Lab 04/01/17 0328 04/02/17 0403 04/03/17 0233 04/04/17 0628 04/05/17 0521  WBC 25.7* 27.0* 19.1* 13.5* 13.7*  NEUTROABS  --  22.9*  --   --   --   HGB 12.2* 11.5* 11.1* 10.6* 10.3*  HCT 35.5* 33.3* 31.9* 31.5* 30.2*  MCV 87.0 87.2 88.1 88.0 87.5  PLT 198 216 229 233 604    Basic Metabolic Panel: Recent Labs  Lab 04/01/17 0328 04/02/17 0403 04/03/17 0233 04/04/17 0628 04/05/17 0521  NA 140 144 143 143 140  K 4.0 4.0 4.5 4.5 4.7  CL 106 113* 114* 115* 111  CO2 23 22 21* 21* 20*  GLUCOSE 232* 236* 242* 279* 239*  BUN 53* 49* 46* 50* 44*  CREATININE 1.79* 1.45* 1.36* 1.55* 1.58*  CALCIUM 8.5* 8.1* 8.0* 8.1* 8.0*    GFR: Estimated Creatinine Clearance: 86.2 mL/min (A) (by C-G formula based on SCr of 1.58 mg/dL (H)).  Liver Function Tests: Recent Labs  Lab 03/31/17 0753 04/02/17 0403  AST 18 18  ALT 36 31  ALKPHOS 45 43  BILITOT 0.7 0.8  PROT 5.9* 5.7*  ALBUMIN 2.5* 2.1*   No results for input(s): LIPASE, AMYLASE in the last 168 hours. No results for input(s): AMMONIA in the last 168  hours.  Coagulation Profile: No results for input(s): INR, PROTIME in the last 168 hours.  Cardiac Enzymes: No results for input(s): CKTOTAL, CKMB, CKMBINDEX, TROPONINI in the last 168 hours.  BNP (last 3 results) No results for input(s): PROBNP in the last 8760 hours.  HbA1C: No results for input(s): HGBA1C in the last 72 hours.  CBG: Recent Labs  Lab 04/04/17 1108 04/04/17 1613 04/04/17 2041 04/05/17 0730 04/05/17 1126  GLUCAP 308* 351* 253* 224* 196*    Lipid Profile: No results for input(s): CHOL, HDL, LDLCALC, TRIG, CHOLHDL, LDLDIRECT in the last 72 hours.  Thyroid Function Tests: No results for input(s): TSH, T4TOTAL, FREET4, T3FREE, THYROIDAB  in the last 72 hours.  Anemia Panel: No results for input(s): VITAMINB12, FOLATE, FERRITIN, TIBC, IRON, RETICCTPCT in the last 72 hours.  Urine analysis:    Component Value Date/Time   COLORURINE AMBER (A) 04/01/2017 1218   APPEARANCEUR CLOUDY (A) 04/01/2017 1218   LABSPEC 1.017 04/01/2017 1218   PHURINE 5.0 04/01/2017 1218   GLUCOSEU NEGATIVE 04/01/2017 1218   HGBUR LARGE (A) 04/01/2017 1218   [place] 04/01/2017 1218   [place] Colon 04/01/2017 1218   PROTEINUR 30 (A) 04/01/2017 1218   NITRITE NEGATIVE 04/01/2017 1218   LEUKOCYTESUR LARGE (A) 04/01/2017 1218    Sepsis Labs: Lactic Acid, Venous No results found for: LATICACIDVEN  MICROBIOLOGY: Recent Results (from the past 240 hour(s))  Culture, blood (routine x 2)     Status: None (Preliminary result)   Collection Time: 04/01/17 12:30 AM  Result Value Ref Range Status   Specimen Description BLOOD RIGHT ARM  Final   Special Requests IN PEDIATRIC BOTTLE Blood Culture adequate volume  Final   Culture   Final    NO GROWTH 4 DAYS Performed at [facility], [place] [address], [place], [place] [address]    Report Status PENDING  Incomplete  Culture, blood (routine x 2)     Status: None (Preliminary result)   Collection Time: 04/01/17 12:40  AM  Result Value Ref Range Status   Specimen Description BLOOD LEFT HAND  Final   Special Requests IN PEDIATRIC BOTTLE Blood Culture adequate volume  Final   Culture   Final    NO GROWTH 4 DAYS Performed at [facility], [place] [address] [place], [place] [address]    Report Status PENDING  Incomplete  Culture, Urine     Status: Abnormal   Collection Time: 04/01/17  1:11 PM  Result Value Ref Range Status   Specimen Description URINE, RANDOM  Final   Special Requests   Final    NONE Performed at [facility], [place] [address] [place], [place] [address]    Culture (A)  Final    >=100,000 COLONIES/mL ESCHERICHIA COLI 50,000 COLONIES/mL ENTEROBACTER SPECIES    Report Status 04/04/2017 FINAL  Final   Organism ID, Bacteria ESCHERICHIA COLI (A)  Final   Organism ID, Bacteria ENTEROBACTER SPECIES (A)  Final      Susceptibility   Escherichia coli - MIC*    AMPICILLIN >=32 RESISTANT Resistant     CEFAZOLIN 16 SENSITIVE Sensitive     CEFTRIAXONE <=1 SENSITIVE Sensitive     CIPROFLOXACIN <=0.25 SENSITIVE Sensitive     GENTAMICIN <=1 SENSITIVE Sensitive     IMIPENEM <=0.25 SENSITIVE Sensitive     NITROFURANTOIN <=16 SENSITIVE Sensitive     TRIMETH/SULFA <=20 SENSITIVE Sensitive     AMPICILLIN/SULBACTAM 16 INTERMEDIATE Intermediate     PIP/TAZO <=4 SENSITIVE Sensitive     Extended ESBL NEGATIVE Sensitive     * >=100,000 COLONIES/mL ESCHERICHIA COLI   Enterobacter species - MIC*    CEFAZOLIN >=64 RESISTANT Resistant     CEFTRIAXONE <=1 SENSITIVE Sensitive     CIPROFLOXACIN <=0.25 SENSITIVE Sensitive     GENTAMICIN <=1 SENSITIVE Sensitive     IMIPENEM 1 SENSITIVE Sensitive     NITROFURANTOIN 64 INTERMEDIATE Intermediate     TRIMETH/SULFA <=20 SENSITIVE Sensitive     PIP/TAZO <=4 SENSITIVE Sensitive     * 50,000 COLONIES/mL ENTEROBACTER SPECIES    RADIOLOGY STUDIES/RESULTS: Dg Chest 2 View  Result Date: 03/21/2017 CLINICAL DATA:  Right-sided weakness. EXAM: CHEST  2 VIEW  COMPARISON:  None. FINDINGS: AP and lateral views of the chest were obtained. The lungs are clear without focal pneumonia, edema, pneumothorax or pleural effusion. Minimal atelectasis noted left base. The cardiopericardial silhouette is within normal limits for size. The visualized bony structures of the thorax are intact. Telemetry leads overlie the chest. IMPRESSION: No active cardiopulmonary disease. Electronically Signed   By: Misty Stanley M.D.   On: 03/21/2017 13:08   Dg Cervical Spine 1 View  Result Date: 03/25/2017 CLINICAL DATA:  Cervical spine fusion. EXAM: DG C-ARM 61-120 MIN; DG CERVICAL SPINE - 1 VIEW COMPARISON:  03/23/2017. FINDINGS: Lower cervical anterior and interbody fusion. Hardware intact. Anatomic alignment. Surgical sponges noted over the lower anterior neck. IMPRESSION: Lower cervical anterior interbody fusion with anatomic alignment. Electronically Signed   By: Marcello Moores  Register   On: 03/25/2017 15:47   Ct Head Wo Contrast  Result Date: 03/31/2017 CLINICAL DATA:  Initial evaluation for generalized weakness, most prevalent within the right lower extremity. EXAM: CT HEAD WITHOUT CONTRAST TECHNIQUE: Contiguous axial images were obtained from the base of the skull through the vertex without intravenous contrast. COMPARISON:  Prior MRI from 03/23/2017. FINDINGS: Brain: Mildly advanced cerebral atrophy for age. Multiple scattered hypodensities seen throughout the periventricular and deep white matter both cerebral hemispheres, several of which radiate in a perpendicular fashion from the lateral ventricles, again concerning for demyelinating disease. Findings are grossly similar relative to recent MRI. No acute intracranial hemorrhage. No acute large vessel territory infarct. No mass lesion, midline shift or mass effect. No hydrocephalus. No extra-axial fluid collection. Vascular: Scalp soft tissues and calvarium within normal limits. Skull: Globes orbital soft tissues within normal limits.  Paranasal sinuses and mastoid air cells are clear. Sinuses/Orbits: None. Other: None. IMPRESSION: 1. Scattered cerebral white-matter hypodensities in a distribution most consistent with underlying demyelinating disease, grossly stable relative to recent MRI. 2. No other new intracranial abnormality identified. Electronically Signed   By: Jeannine Boga M.D.   On: 03/31/2017 19:04   Mr Jeri Cos RK Contrast  Result Date: 03/23/2017 CLINICAL DATA:  Progressive RIGHT-sided weakness for 7 months. History of hypertension, diabetes. Follow-up cervical spinal cord lesions. EXAM: MRI HEAD WITHOUT AND WITH CONTRAST TECHNIQUE: Multiplanar, multiecho pulse sequences of the brain and surrounding structures were obtained without and with intravenous contrast. CONTRAST:  63m MULTIHANCE GADOBENATE DIMEGLUMINE 529 MG/ML IV SOLN COMPARISON:  None. FINDINGS: INTRACRANIAL CONTENTS: Faint reduced diffusion bilateral occipital lobes, extending toward splenium of corpus callosum with normalized or increased diffusivity. At least 5 subcentimeter posterior fossa lesions. 4 mm T2 bright lesion ventral spinal cord. Far greater than 10 supratentorial white matter lesions, some of which correspond to diffusion abnormality, many of which radiate from the peri ventricular margin with low T1 signal seen with black holes of demyelination. Dominant lesion LEFT frontal periventricular white matter measuring 18 mm. Additional juxta cortical and deep white matter lesions. Two 9 mm lesions LEFT thalamus extending to LEFT hypothalamus. 4 mm lesion RIGHT basal ganglia mild parenchymal brain volume loss. No hydrocephalus. Faint marginal enhancement bilateral occipital lobe lesion (coronal 8/28 and 4/28 post contrast T1). No susceptibility artifact to suggest hemorrhage. No midline shift, mass effect. No abnormal extra-axial fluid collections or extra-axial enhancement. VASCULAR: Normal major intracranial vascular flow voids present at skull base.  SKULL AND UPPER CERVICAL SPINE: No abnormal sellar expansion. No suspicious calvarial bone marrow signal. Craniocervical junction maintained. SINUSES/ORBITS: The mastoid air-cells and included paranasal sinuses are well-aerated.T2 bright signal LEFT optic nerve intracanalicular  segment suspected, no definite enhancement though limited assessment. OTHER: None. IMPRESSION: 1. At least 5 infratentorial and greater than 10 supratentorial lesions consistent with chronic demyelination, with acute to subacute enhancing lesions bilateral occipital lobes. Subcentimeter LEFT thalamus and RIGHT basal ganglia demyelinating plaques. 2. Probable LEFT optic neuritis. 3. Mild parenchymal brain volume loss for age. Electronically Signed   By: Elon Alas M.D.   On: 03/23/2017 22:20   Mr Lumbar Spine Wo Contrast  Result Date: 03/21/2017 CLINICAL DATA:  Increasing leg weakness.  Urinary incontinence. EXAM: MRI LUMBAR SPINE WITHOUT CONTRAST TECHNIQUE: Multiplanar, multisequence MR imaging of the lumbar spine was performed. No intravenous contrast was administered. COMPARISON:  None. FINDINGS: Segmentation:  Assumed standard. Alignment: Mild straightening of the lumbar lordosis. Sagittal alignment is maintained. Vertebrae: Diffusely heterogeneous marrow signal throughout the lumbar spine with multifocal areas of abnormal marrow replacement. Similar appearing marrow signal abnormality is seen in the visualized sacrum, bilateral iliac bones, and bilateral lower thoracic ribs. Conus medullaris and cauda equina: Conus extends to the L1-L2 level. Patchy abnormal signal within the visualized lower thoracic cord. Clumping of the proximal cauda equina nerve roots due to downstream stenosis. Paraspinal and other soft tissues: Negative. Disc levels: T10-T11: Right lateral and anterolateral disc osteophyte complex. No stenosis. T10-T11: Right lateral disc osteophyte complex.  No stenosis. T12-L1:  Negative. L1-L2:  Negative. L2-L3:  Diffuse disc bulge with superimposed large central disc protrusion and mild left greater than right facet arthropathy resulting in severe central spinal canal stenosis. No neuroforaminal stenosis. L3-L4: Diffuse disc bulge with superimposed central and left paracentral disc protrusion. Mild bilateral facet arthropathy. Severe central spinal canal stenosis and moderate left neuroforaminal stenosis. L4-L5: Diffuse disc bulge, slightly asymmetric to the left. Moderate bilateral facet arthropathy. Severe bilateral lateral recess stenosis. Moderate central spinal canal stenosis. Mild to moderate bilateral neuroforaminal stenosis. L5-S1: Diffuse disc bulge with superimposed central disc protrusion. Mild narrowing of the bilateral lateral recesses. Disc material abuts the descending conjoined left S1 and S2 nerve roots. Mild left and severe right neuroforaminal stenosis. IMPRESSION: 1. Diffusely heterogeneous marrow signal throughout the visualized thoracolumbar spine, pelvis, and lower thoracic ribs, concerning for multiple myeloma or metastatic disease. 2. Patchy, abnormal signal throughout the visualized lower thoracic spinal cord, nonspecific. Recommend further evaluation with MRI of the cervical and thoracic spine with and without contrast. 3. Multilevel degenerative changes throughout the lumbar spine with severe central spinal canal stenosis at L2-L3 and L3-L4, likely accounting for the patient's symptoms. 4. Severe right neuroforaminal stenosis at L5-S1. Moderate left neuroforaminal stenosis at L3-L4. Electronically Signed   By: Titus Dubin M.D.   On: 03/21/2017 16:27   Mr Cervical Spine W Wo Contrast  Addendum Date: 04/01/2017   ADDENDUM REPORT: 04/01/2017 12:08 ADDENDUM: Critical Value/emergent results were called by telephone at the time of interpretation on 04/01/2017 at 1150 hours to Dr. Oren Binet , who verbally acknowledged these results. He advises that the patient's hemoglobin value has trended  down 3 points in the past four days, arguing in favor of the abnormal fluid collection in this study representing hematoma. We discussed, however, that the possibility of abscess is not excluded by imaging. Electronically Signed   By: Genevie Ann M.D.   On: 04/01/2017 12:08   Result Date: 04/01/2017 CLINICAL DATA:  52 year old male postoperative day 7 status post C6 corpectomy, anterior cervical spine instrumentation and ACDF spanning C5-C7 4 decompression of spinal cord and exiting nerve roots. Fever overnight, bilateral lower extremity weakness. Evidence of diffuse demyelinating  disease in the brain and spine. Status post a course of steroids. History of type 2 diabetes, morbid obesity. EXAM: MRI CERVICAL SPINE WITHOUT AND WITH CONTRAST TECHNIQUE: Multiplanar and multiecho pulse sequences of the cervical spine, to include the craniocervical junction and cervicothoracic junction, were obtained without and with intravenous contrast. CONTRAST:  27m MULTIHANCE GADOBENATE DIMEGLUMINE 529 MG/ML IV SOLN COMPARISON:  Preoperative cervical spine MRI 03/21/2017. FINDINGS: Alignment: Straightening of the cervical spine. No spondylolisthesis. Vertebrae: Hardware susceptibility artifact from the C5 to the C7 level. Evidence of anterior corpectomy of C6. Underlying diffuse abnormal bone marrow signal throughout the visible spine. No marrow edema or evidence of acute osseous abnormality. Cord: New confluent abnormal T2 and STIR hyperintensity within the cervical spinal cord from the C3-C4 to the C5-C6 cord level. This has the appearance of holo cord edema on sagittal T2 series 4, image 10. There were superimposed multifocal patchy T2 and STIR hyperintense cord lesions, and 1 such lesion is redemonstrated at the C6-C7 cord level just inferior to the holo cord abnormality. Following contrast there is only faint residual intramedullary enhancement corresponding to the C6-C7 lesion. There is questionable mild increased leptomeningeal  enhancement of the cord above C6-C7. The spinal cord below C7 and above the C2 body appears normal. Stable visible brainstem with small medullary and pontine nonenhancing lesions. Posterior Fossa, vertebral arteries, paraspinal tissues: There is a large prevertebral fluid collection tracking inferiorly from the C4 level into the upper thorax, and tracking laterally to the right at the C5-C6 level probably along the surgical approach. In the upper thorax the collection then tracks posteriorly into the right lateral paraspinal soft tissues. The collection is best demonstrated on postcontrast images series 11, image 10 series 13, image 20 and series 14, images 10-13. The entire collection encompasses 29 by at least 150 x 84 millimeters, and has an estimated volume of greater than 100 mL. The collection is mildly rim enhancing, but dark on all sequences with small areas of internal T2 and STIR hyperintensity. I favor this is postoperative hematoma. Subsequently there is anterior mass effect on the trachea and esophagus, but no significant airway narrowing is demonstrated. The cervical esophagus appears severely affected. The prevertebral soft tissue mass effect continues to the thoracic inlet. Disc levels: The C2-C3 level is unchanged.  The C7-T1 level is unchanged. From C3-C4 through C6-C7 there is progressive spinal stenosis which appears related to a ventral epidural space occupying lesion tracking linearly posterior to the vertebral bodies. This measures up to 3-4 millimeters in thickness and appears maximal at the C5 and C6 levels. It has similar decreased signal on all sequences suggesting hematoma. Axial images demonstrate this new space-occupying process best on series 8 images 7and 11, and series 9, image 14. Associated increased spinal cord mass effect at these levels felt related to the new abnormal cord signal. IMPRESSION: 1. New spinal cord compression and suspected cord edema from the C3 level through the  C6 level appears related to material in the ventral epidural space which is favored to be hematoma (see #2). New holo-cord edema from the C3 to the C6 cord levels, superimposed on several preexisting demyelinating spinal cord lesions. 2. Large prevertebral fluid collection tracking from C4 into the upper thoracic spine, and dissecting through the right lateral neck likely along the surgical approach. In the upper thorax the collection continues and tracks into the right lateral paraspinal space. The fluid is dark on all sequences with mild rim enhancement, and favored to be a large  hematoma with estimated volume exceeding 100 mL. 3. Associated regional mass effect on the neck and thoracic inlet soft tissues. No significant airway narrowing is demonstrated. 4. Postoperative changes C5 through C7 including partial C6 corpectomy. Electronically Signed: By: Genevie Ann M.D. On: 04/01/2017 11:43   Mr Cervical Spine W Or Wo Contrast  Result Date: 03/21/2017 CLINICAL DATA:  Bilateral lower extremity weakness. Urinary incontinence. Abnormal distal spinal cord on lumbar spine MRI today. EXAM: MRI CERVICAL AND THORACIC SPINE WITHOUT AND WITH CONTRAST TECHNIQUE: Multiplanar and multiecho pulse sequences of the cervical spine, to include the craniocervical junction and cervicothoracic junction, and thoracic spine, were obtained without and with intravenous contrast. CONTRAST:  54m MULTIHANCE GADOBENATE DIMEGLUMINE 529 MG/ML IV SOLN COMPARISON:  Lumbar spine MRI today FINDINGS: MRI CERVICAL SPINE FINDINGS The study is moderately motion degraded. Alignment: Cervical spine straightening.  No significant listhesis. Vertebrae: Diffuse heterogeneous replacement of normal marrow signal throughout the spine as described on prior lumbar MRI. No dominant enhancing lesion. Preserved vertebral body heights without evidence of fracture. Cord: There is abnormal T2 hyperintensity in the right lateral spinal cord at C3-4 extending over a  length of approximately 1.5 cm. Smaller areas of abnormal T2 signal are present at the superior C6 endplate level and at the C6-7 disc space level, with the latter demonstrating enhancement. Posterior Fossa, vertebral arteries, paraspinal tissues: Discrete 4 mm T2 hyperintense nonenhancing lesion in the medulla. Additional T2 hyperintense lesions in the pons and left cerebellum. Disc levels: The cervical spinal canal is small in caliber diffusely on a congenital basis. C2-3: Negative. C3-4: Shallow central disc protrusion without significant stenosis. C4-5: Mild disc bulging and uncovertebral spurring result in mild spinal stenosis and moderate bilateral neural foraminal stenosis. C5-6: Broad-based posterior disc osteophyte complex results in moderate spinal stenosis with mild cord flattening and moderate to severe bilateral neural foraminal stenosis. C6-7: Broad-based posterior disc osteophyte complex and central disc extrusion result in severe spinal stenosis with moderate cord flattening and severe bilateral neural foraminal stenosis. C7-T1: Mild uncovertebral spurring results in at most mild bilateral neural foraminal stenosis. No spinal stenosis. MRI THORACIC SPINE FINDINGS The study is mildly motion degraded. Alignment: Normal. Vertebrae: Diffuse heterogeneous replacement of normal marrow signal throughout the spine and ribs as well as visualized clavicles and sternum. No dominant enhancing lesion. Preserved vertebral body heights without evidence of fracture. Cord: As seen on today's earlier lumbar MRI, there is patchy T2 hyperintensity in the distal thoracic spinal cord, with the most discrete lesion being on the left at T12. There is a small T2 hyperintense focus in the left central cord at T6-7. A small lesion versus artifact is questioned at T3. There is the suggestion of focal enhancement along the midline dorsal cord at T5 on axial images (series 21, image 21), however this is not confirmed on sagittal  images. Paraspinal and other soft tissues: Unremarkable. Disc levels: Endplate spurring results in mild left neural foraminal stenosis at T1-2. A right posterolateral/foraminal disc osteophyte at T2-3 results in mild right neural foraminal stenosis. A shallow left paracentral disc osteophyte complex and left facet hypertrophy at T6-7 result in mild spinal stenosis and mild left neural foraminal stenosis. A slightly larger left paracentral disc osteophyte complex at T7-8 combines with facet hypertrophy to result in mild spinal stenosis. There is at most mild disc bulging more inferiorly in the thoracic spine without significant stenosis. IMPRESSION: 1. Scattered T2 hyperintense lesions throughout the cervical and thoracic spinal cord as well as in the pons,  medulla, and cerebellum. A lesion at C6-7 is enhancing. While some lesions are associated with spinal stenosis and could in isolation reflect spondylotic myelopathy, the presence of lesions elsewhere in the cord and posterior fossa are indicative of a more widespread process such as demyelinating disease. Infection, inflammatory conditions such as sarcoidosis, and metastatic disease are additional considerations. Brain MRI and CSF analysis are recommended. 2. 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 3. Mild spinal stenosis at T6-7 and T7-8 due to disc and facet degeneration. 4. Diffusely abnormal bone marrow signal. Considerations include multiple myeloma, metastatic disease, and other infiltrative/myelofibrotic marrow processes. Electronically Signed   By: Logan Bores M.D.   On: 03/21/2017 21:44   Mr Thoracic Spine W Wo Contrast  Result Date: 03/21/2017 CLINICAL DATA:  Bilateral lower extremity weakness. Urinary incontinence. Abnormal distal spinal cord on lumbar spine MRI today. EXAM: MRI CERVICAL AND THORACIC SPINE WITHOUT AND WITH CONTRAST TECHNIQUE: Multiplanar and multiecho pulse sequences of  the cervical spine, to include the craniocervical junction and cervicothoracic junction, and thoracic spine, were obtained without and with intravenous contrast. CONTRAST:  57m MULTIHANCE GADOBENATE DIMEGLUMINE 529 MG/ML IV SOLN COMPARISON:  Lumbar spine MRI today FINDINGS: MRI CERVICAL SPINE FINDINGS The study is moderately motion degraded. Alignment: Cervical spine straightening.  No significant listhesis. Vertebrae: Diffuse heterogeneous replacement of normal marrow signal throughout the spine as described on prior lumbar MRI. No dominant enhancing lesion. Preserved vertebral body heights without evidence of fracture. Cord: There is abnormal T2 hyperintensity in the right lateral spinal cord at C3-4 extending over a length of approximately 1.5 cm. Smaller areas of abnormal T2 signal are present at the superior C6 endplate level and at the C6-7 disc space level, with the latter demonstrating enhancement. Posterior Fossa, vertebral arteries, paraspinal tissues: Discrete 4 mm T2 hyperintense nonenhancing lesion in the medulla. Additional T2 hyperintense lesions in the pons and left cerebellum. Disc levels: The cervical spinal canal is small in caliber diffusely on a congenital basis. C2-3: Negative. C3-4: Shallow central disc protrusion without significant stenosis. C4-5: Mild disc bulging and uncovertebral spurring result in mild spinal stenosis and moderate bilateral neural foraminal stenosis. C5-6: Broad-based posterior disc osteophyte complex results in moderate spinal stenosis with mild cord flattening and moderate to severe bilateral neural foraminal stenosis. C6-7: Broad-based posterior disc osteophyte complex and central disc extrusion result in severe spinal stenosis with moderate cord flattening and severe bilateral neural foraminal stenosis. C7-T1: Mild uncovertebral spurring results in at most mild bilateral neural foraminal stenosis. No spinal stenosis. MRI THORACIC SPINE FINDINGS The study is mildly  motion degraded. Alignment: Normal. Vertebrae: Diffuse heterogeneous replacement of normal marrow signal throughout the spine and ribs as well as visualized clavicles and sternum. No dominant enhancing lesion. Preserved vertebral body heights without evidence of fracture. Cord: As seen on today's earlier lumbar MRI, there is patchy T2 hyperintensity in the distal thoracic spinal cord, with the most discrete lesion being on the left at T12. There is a small T2 hyperintense focus in the left central cord at T6-7. A small lesion versus artifact is questioned at T3. There is the suggestion of focal enhancement along the midline dorsal cord at T5 on axial images (series 21, image 21), however this is not confirmed on sagittal images. Paraspinal and other soft tissues: Unremarkable. Disc levels: Endplate spurring results in mild left neural foraminal stenosis at T1-2. A right posterolateral/foraminal disc osteophyte at T2-3 results in mild right neural foraminal stenosis.  A shallow left paracentral disc osteophyte complex and left facet hypertrophy at T6-7 result in mild spinal stenosis and mild left neural foraminal stenosis. A slightly larger left paracentral disc osteophyte complex at T7-8 combines with facet hypertrophy to result in mild spinal stenosis. There is at most mild disc bulging more inferiorly in the thoracic spine without significant stenosis. IMPRESSION: 1. Scattered T2 hyperintense lesions throughout the cervical and thoracic spinal cord as well as in the pons, medulla, and cerebellum. A lesion at C6-7 is enhancing. While some lesions are associated with spinal stenosis and could in isolation reflect spondylotic myelopathy, the presence of lesions elsewhere in the cord and posterior fossa are indicative of a more widespread process such as demyelinating disease. Infection, inflammatory conditions such as sarcoidosis, and metastatic disease are additional considerations. Brain MRI and CSF analysis are  recommended. 2. 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 3. Mild spinal stenosis at T6-7 and T7-8 due to disc and facet degeneration. 4. Diffusely abnormal bone marrow signal. Considerations include multiple myeloma, metastatic disease, and other infiltrative/myelofibrotic marrow processes. Electronically Signed   By: Logan Bores M.D.   On: 03/21/2017 21:44   Dg Chest Port 1v Same Day  Result Date: 04/01/2017 CLINICAL DATA:  Shortness of breath, atrial fibrillation, diabetes, multiple scleroses. EXAM: PORTABLE CHEST 1 VIEW COMPARISON:  Chest x-ray of March 21, 2017 FINDINGS: The lungs are well-expanded. There is subtle increased interstitial density in the lower third of the right lung. The heart and pulmonary vascularity are normal. The mediastinum is normal in width with exception of soft tissue fullness in the right paratracheal region. IMPRESSION: Probable subsegmental atelectasis at the right lung base.  No CHF. Soft tissue fullness in the right paratracheal region is more conspicuous than on the previous study. This may reflect lymphadenopathy, intrathoracic border, or vascular structure. Given the patient's lower anterior cervical fusion 1 week ago, hematoma or infection may be present. Contrast-enhanced CT scanning of the neck and chest is recommended. Electronically Signed   By: David  Martinique M.D.   On: 04/01/2017 08:10   Dg C-arm 1-60 Min  Result Date: 03/25/2017 CLINICAL DATA:  Cervical spine fusion. EXAM: DG C-ARM 61-120 MIN; DG CERVICAL SPINE - 1 VIEW COMPARISON:  03/23/2017. FINDINGS: Lower cervical anterior and interbody fusion. Hardware intact. Anatomic alignment. Surgical sponges noted over the lower anterior neck. IMPRESSION: Lower cervical anterior interbody fusion with anatomic alignment. Electronically Signed   By: Marcello Moores  Register   On: 03/25/2017 15:47   [place] Ekg Site Rite  Result Date: 04/03/2017 If Site Rite image not  attached, placement could not be confirmed due to current cardiac rhythm.  Dg Fluoro Guide Lumbar Puncture  Result Date: 03/22/2017 CLINICAL DATA:  Lower extremity weakness. Spinal cord lesions on MRI. Severe multilevel lumbar stenosis. EXAM: DIAGNOSTIC LUMBAR PUNCTURE UNDER FLUOROSCOPIC GUIDANCE FLUOROSCOPY TIME:  Fluoroscopy Time:  0 minutes, 42 seconds Radiation Exposure Index (if provided by the fluoroscopic device): 8.9 mGy Number of Acquired Spot Images: 0 PROCEDURE: I discussed the risks (including hemorrhage, infection, headache, and nerve damage, among others), benefits, and alternatives to fluoroscopically guided lumbar puncture with the patient. We specifically discussed the high technical likelihood of success of the procedure. The patient understood and elected to undergo the procedure. Prior to the procedure I reviewed of the patient's lumbar spine MRI and determined that L5-S1 was the optimum level. Standard time-out was employed. Following sterile skin prep and local anesthetic administration consisting of 1  percent lidocaine, a 20 gauge spinal needle was advanced without difficulty into the thecal sac at the at the L5-S1 level from a slightly paramedian approach. Clear CSF was returned. Opening pressure was not obtained. 15 cc of clear CSF was collected. The needle was subsequently removed and the skin cleansed and bandaged. No immediate complications were observed. IMPRESSION: 1. Technically successful fluoroscopically lumbar puncture at the L5-S1 level yielding 15 cc of clear CSF. Electronically Signed   By: Van Clines M.D.   On: 03/22/2017 17:10     LOS: 15 days   Reyne Dumas, MD  [facility]   If 7PM-7AM, please contact night-coverage [email or web address] Password [email or web address] 04/05/2017, 1:13 PM

## 2017-04-05 NOTE — Progress Notes (Signed)
   04/05/17 1600  Clinical Encounter Type  Visited With Patient  Visit Type Initial  Referral From Nurse  Consult/Referral To Chaplain  Spiritual Encounters  Spiritual Needs Prayer;Emotional  Stress Factors  Patient Stress Factors Exhausted  Family Stress Factors Exhausted    Pt was alone in his room. No family pre sent and patient talked of poor family support other than church family. Chaplain provided emotional support through reflective listening and compassionate presence.  Bev Drennen a Water quality scientist, [employer]

## 2017-04-05 NOTE — Progress Notes (Signed)
CSW continuing to follow. CSW seeking LOG SNF bed, as patient so far unable to tolerate CIR level of therapy. Patient has tentative bed offers from [facility] and [facility]. CSW following with the liaison for these facilities to determine if LOG will be accepted. CSW awaiting LOG bed offer and will support with disposition.  Abigail Butts, LCSWA [phone]

## 2017-04-05 NOTE — Progress Notes (Signed)
Progress Note  Patient Name: Dylan [name] Date of Encounter: 04/05/2017  Primary Cardiologist: Chrystie Nose, MD   Subjective   52 yo with AF with RVR. S/p surgery on cervical spine Developed AF with RVR following surgery .  Was started on IV heparin ,  Developed worsening weakness in legs.  Has maintained NSR  Seems to be doing better.   Inpatient Medications    Scheduled Meds: . amiodarone  200 mg Oral BID  . B-complex with vitamin C  1 tablet Oral Daily  . dexamethasone  3 mg Intravenous Q8H  . diltiazem  30 mg Oral Q8H  . insulin aspart  0-20 Units Subcutaneous TID WC  . insulin aspart  0-5 Units Subcutaneous QHS  . insulin aspart  6 Units Subcutaneous TID WC  . insulin glargine  48 Units Subcutaneous QHS  . pantoprazole  40 mg Oral Daily  . sodium chloride flush  10-40 mL Intracatheter Q12H  . sodium chloride flush  3 mL Intravenous Q12H   Continuous Infusions: . sodium chloride 75 mL/hr at 04/05/17 0252  . ceFEPime (MAXIPIME) IV Stopped (04/05/17 0426)  . methocarbamol (ROBAXIN)  IV    . vancomycin Stopped (04/05/17 0946)   PRN Meds: acetaminophen **OR** acetaminophen, benzonatate, bisacodyl, menthol-cetylpyridinium **OR** phenol, methocarbamol **OR** methocarbamol (ROBAXIN)  IV, metoprolol tartrate, ondansetron **OR** ondansetron (ZOFRAN) IV, senna-docusate, sodium chloride flush   Vital Signs    Vitals:   04/04/17 2343 04/05/17 0348 04/05/17 0516 04/05/17 0732  BP: (!) 141/81 (!) 146/85 (!) 141/85 129/72  Pulse: 65 68  70  Resp: 17 20  (!) 21  Temp: 98.5 F (36.9 C) 98.4 F (36.9 C)    TempSrc: Axillary Oral    SpO2: 97% 99%  100%  Weight:  (!) 306 lb 7 oz (139 kg)    Height:        Intake/Output Summary (Last 24 hours) at 04/05/2017 0951 Last data filed at 04/05/2017 0659 Gross per 24 hour  Intake 4834.68 ml  Output 3875 ml  Net 959.68 ml   Filed Weights   03/31/17 0457 04/03/17 0434 04/05/17 0348  Weight: (!) 301 lb 5.9 oz (136.7 kg) 291  lb 14.2 oz (132.4 kg) (!) 306 lb 7 oz (139 kg)    Telemetry    NSR - Personally Reviewed  Physical Exam   GEN: No acute distress.   Neck: No JVD, no carotid bruits Cardiac: RRR, no murmurs, rubs, or gallops.  Respiratory: Clear to auscultation bilaterally, no wheezes/ rales/ rhonchi GI: NABS, Soft, nontender, non-distended  MS: No edema; No deformity. Neuro:  Nonfocal, moving all extremities spontaneously Psych: Normal affect   Labs    Chemistry Recent Labs  Lab 03/31/17 0753  04/02/17 0403 04/03/17 0233 04/04/17 0628 04/05/17 0521  NA 136   < > 144 143 143 140  K 4.4   < > 4.0 4.5 4.5 4.7  CL 103   < > 113* 114* 115* 111  CO2 21*   < > 22 21* 21* 20*  GLUCOSE 261*   < > 236* 242* 279* 239*  BUN 42*   < > 49* 46* 50* 44*  CREATININE 1.47*   < > 1.45* 1.36* 1.55* 1.58*  CALCIUM 8.3*   < > 8.1* 8.0* 8.1* 8.0*  PROT 5.9*  --  5.7*  --   --   --   ALBUMIN 2.5*  --  2.1*  --   --   --   AST 18  --  18  --   --   --   ALT 36  --  31  --   --   --   ALKPHOS 45  --  43  --   --   --   BILITOT 0.7  --  0.8  --   --   --   GFRNONAA 54*   < > 55* 60* 51* 50*  GFRAA >60   < > >60 >60 59* 58*  ANIONGAP 12   < > 9 8 7 9    < > = values in this interval not displayed.     Hematology Recent Labs  Lab 04/03/17 0233 04/04/17 0628 04/05/17 0521  WBC 19.1* 13.5* 13.7*  RBC 3.62* 3.58* 3.45*  HGB 11.1* 10.6* 10.3*  HCT 31.9* 31.5* 30.2*  MCV 88.1 88.0 87.5  MCH 30.7 29.6 29.9  MCHC 34.8 33.7 34.1  RDW 13.9 13.5 13.4  PLT 229 233 220    Cardiac EnzymesNo results for input(s): TROPONINI in the last 168 hours. No results for input(s): TROPIPOC in the last 168 hours.   BNPNo results for input(s): BNP, PROBNP in the last 168 hours.   DDimer No results for input(s): DDIMER in the last 168 hours.   Radiology    [place] Ekg Site Rite  Result Date: 04/03/2017 If [facility] image not attached, placement could not be confirmed due to current cardiac rhythm.   Cardiac Studies    Echocardiogram 03/30/17: Study Conclusions  - Left ventricle: The cavity size was normal. There was mild   concentric hypertrophy. Systolic function was normal. The   estimated ejection fraction was in the range of 50% to 55%.   Although no diagnostic regional wall motion abnormality was   identified, this possibility cannot be completely excluded on the   basis of this study. Doppler parameters are consistent with   abnormal left ventricular relaxation (grade 1 diastolic   dysfunction). - Left atrium: The atrium was mildly dilated.   Patient Profile     52 y.o.malewith a hx of DM, HTN, back pain/sciatica, severe cervical stenosis s/p anterior cervical decompression/discectomy fusion and corpectomy 03/25/17, hospital course complicated by atrial fibrillation with RVR. Cardiology following for Afib management.  Assessment & Plan    1. Atrial fibrillation with RVR:new onset atrial fibrillation with RVR likely in the setting of infection. Now in NSR on amiodarone and diltiazem.  -CHA2DS2VASc =2(HTN and DM)- ultimately would benefit from anticoagulation, however risks outweigh benefits at this time - Eliquis on hold given new/worsening LE weakness and c/f post-surgical hematoma on MRI  - Continue amiodarone - transitioned to po 3/2. Can likely follow-up in the Afib clinic for further management/titration - Continue diltiazem 30mg  q8h  2. S/P anterior cervical spine decompression, discectomy fusion, and corpectomy:patient with complaints of B/L LE weakness, also with fever 2/27 and significant leukocytosis. C/f possible surgical hematoma vs abscess. - MRI spine with new spinal cord compression and suspected cord edema, large prevertebral fluid collection likely 2/2 hematoma, and no significant airway narrowing.  - Eliquis on hold indefinitely - Continue antibiotics and further management per primary team  3. HTN:BP stable - Continue low dose diltiazem  4. DM type 2:Hgb  A1C 8.4 - Continue management per primary team    For questions or updates, please contact [facility] Please consult [email or web address] for contact info under Cardiology/STEMI.      Signed, Beatriz Stallion, PA-C  04/05/2017, 9:51 AM   [phone]   Attending Note:   The  patient was seen and examined.  Agree with assessment and plan as noted above.  Changes made to the above note as needed.  Patient seen and independently examined with Judy Pimple, PA.   We discussed all aspects of the encounter. I agree with the assessment and plan as stated above.  1.   Atrial fib:   Doing well on amiodarone 200 BID . Not on anticoagulation due to cervical spine hematoma and worsening leg weakness after starting IV heparin .   I would decrease his Amiodarone to 200 mg a day upon discharge. He may follow up in the Atrial fib clinic and with Dr. Rennis Golden.  Will sign off. Call for questions    I have spent a total of 40 minutes with patient reviewing hospital  notes , telemetry, EKGs, labs and examining patient as well as establishing an assessment and plan that was discussed with the patient. > 50% of time was spent in direct patient care.    Vesta Mixer, Montez Hageman., MD, [facility] 04/05/2017, 10:14 AM [address],  [address] Office [phone] Pager [phone]

## 2017-04-05 NOTE — Progress Notes (Signed)
Physical Therapy Treatment Patient Details Name: Dylan [name] MRN: [id number] DOB: Jul 31, 1965 Today's Date: 04/05/2017    History of Present Illness Patient is a 52 y/o male who presents with 7 month hx of progressive weakness, Right>left, s/p 2 falls at home. 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 Cord compression at C5-6 spinal level. MRI are consistent with MS. s/p C5-6, 6-7 ACDF 2/21. PMH includes HTN, DM.    PT Comments    Little progress made in function.  Emphasis on rolling, transition to sit, sitting balance with reaching and coming into and out of midline.    Follow Up Recommendations  SNF;Supervision/Assistance - 24 hour     Equipment Recommendations  Other (comment)(TBA next venues)    Recommendations for Other Services       Precautions / Restrictions Precautions Precautions: Fall    Mobility  Bed Mobility Overal bed mobility: Needs Assistance Bed Mobility: Rolling;Sidelying to Sit;Sit to Supine Rolling: Max assist;+2 for physical assistance;Total assist Sidelying to sit: Total assist;+2 for physical assistance   Sit to supine: Total assist;+2 for physical assistance   General bed mobility comments: multimodal cues for rolling, side to sit, significant 2 person assist to come up to sitting.  Transfers                 General transfer comment: unsafe to complete squat pivot type transfer, needs lift pad.  Ambulation/Gait                 Stairs            Wheelchair Mobility    Modified Rankin (Stroke Patients Only) Modified Rankin (Stroke Patients Only) Modified Rankin: Severe disability     Balance Overall balance assessment: Needs assistance Sitting-balance support: Feet supported;Bilateral upper extremity supported;Single extremity  supported Sitting balance-Leahy Scale: Zero Sitting balance - Comments: pt sat EOB12 plus minutes working on truncal activation (abs. separate from paraspinals), reaching to "HIgh 5",       Standing balance comment: Not safe to try.                            Cognition Arousal/Alertness: Awake/alert;Lethargic Behavior During Therapy: WFL for tasks assessed/performed Overall Cognitive Status: Within Functional Limits for tasks assessed                                        Exercises Other Exercises Other Exercises: PROM to bil LE, putting muscles on stretch to help pt activate. Other Exercises: AAROM bil UE's     General Comments        Pertinent Vitals/Pain Pain Assessment: No/denies pain    Home Living                      Prior Function            PT Goals (current goals can now be found in the care plan section) Acute Rehab PT Goals Patient Stated Goal: to return to [facility] and be able to walk PT Goal Formulation: With patient Time For Goal Achievement: 04/15/17 Potential to Achieve Goals: Fair Progress towards PT goals: Progressing toward goals(very slow return)    Frequency  Min 3X/week      PT Plan Current plan remains appropriate    Co-evaluation              AM-PAC PT "6 Clicks" Daily Activity  Outcome Measure  Difficulty turning over in bed (including adjusting bedclothes, sheets and blankets)?: Unable Difficulty moving from lying on back to sitting on the side of the bed? : Unable Difficulty sitting down on and standing up from a chair with arms (e.g., wheelchair, bedside commode, etc,.)?: Unable Help needed moving to and from a bed to chair (including a wheelchair)?: Total Help needed walking in hospital room?: Total Help needed climbing 3-5 steps with a railing? : Total 6 Click Score: 6    End of Session   Activity Tolerance: Patient tolerated treatment well;Patient limited by fatigue Patient  left: in bed;with call bell/phone within reach;with bed alarm set Nurse Communication: Mobility status;Need for lift equipment PT Visit Diagnosis: Muscle weakness (generalized) (M62.81);Other abnormalities of gait and mobility (R26.89)     Time: 1610-9604 PT Time Calculation (min) (ACUTE ONLY): 29 min  Charges:  $Therapeutic Activity: 23-37 mins                    G Codes:       April 24, 2017  Powell Bing, PT [phone]  (pager)   Eliseo Gum Adilen Pavelko 2017-04-24, 6:04 PM

## 2017-04-06 LAB — CULTURE, BLOOD (ROUTINE X 2)
Culture: NO GROWTH
Culture: NO GROWTH
Special Requests: ADEQUATE
Special Requests: ADEQUATE

## 2017-04-06 LAB — GLUCOSE, CAPILLARY
GLUCOSE-CAPILLARY: 361 mg/dL — AB (ref 65–99)
GLUCOSE-CAPILLARY: 265 mg/dL — AB (ref 65–99)
GLUCOSE-CAPILLARY: 241 mg/dL — AB (ref 65–99)
Glucose-Capillary: 226 mg/dL — ABNORMAL HIGH (ref 65–99)

## 2017-04-06 LAB — COMPREHENSIVE METABOLIC PANEL
ALBUMIN: 1.8 g/dL — AB (ref 3.5–5.0)
ALK PHOS: 48 U/L (ref 38–126)
ALT: 139 U/L — ABNORMAL HIGH (ref 17–63)
ANION GAP: 7 (ref 5–15)
AST: 40 U/L (ref 15–41)
BUN: 45 mg/dL — ABNORMAL HIGH (ref 6–20)
CALCIUM: 7.9 mg/dL — AB (ref 8.9–10.3)
CO2: 19 mmol/L — AB (ref 22–32)
Chloride: 109 mmol/L (ref 101–111)
Creatinine, Ser: 1.56 mg/dL — ABNORMAL HIGH (ref 0.61–1.24)
GFR calc Af Amer: 59 mL/min — ABNORMAL LOW (ref >60)
GFR calc non Af Amer: 51 mL/min — ABNORMAL LOW (ref >60)
GLUCOSE: 263 mg/dL — AB (ref 65–99)
POTASSIUM: 4.9 mmol/L (ref 3.5–5.1)
SODIUM: 135 mmol/L (ref 135–145)
Total Bilirubin: 0.7 mg/dL (ref 0.3–1.2)
Total Protein: 5.2 g/dL — ABNORMAL LOW (ref 6.5–8.1)

## 2017-04-06 MED ORDER — INSULIN GLARGINE 100 UNIT/ML ~~LOC~~ SOLN
60.0000 [IU] | Freq: Every day | SUBCUTANEOUS | Status: DC
  Administered 2017-04-06 – 2017-04-08 (×3): 60 [IU] via SUBCUTANEOUS
  Filled 2017-04-06 (×4): qty 0.6

## 2017-04-06 MED ORDER — GLUCERNA SHAKE PO LIQD
237.0000 mL | Freq: Three times a day (TID) | ORAL | Status: DC
  Administered 2017-04-06 – 2017-04-09 (×9): 237 mL via ORAL

## 2017-04-06 NOTE — Progress Notes (Signed)
   04/06/17 1300  Clinical Encounter Type  Visited With Patient;Patient and family together  Visit Type Follow-up  Referral From Chaplain  Consult/Referral To Chaplain  Spiritual Encounters  Spiritual Needs Brochure;Prayer;Emotional  Stress Factors  Patient Stress Factors Exhausted  Family Stress Factors Exhausted  Advance Directives (For Healthcare)  Does Patient Have a Medical Advance Directive? Yes   This is a pt I had visited yesterday and promised to revisit today. One family member was on-site today when I visited. I called two volunteers and a notary and Pt's POA paperwork got completed. Pt was very appreciative of the entire Visit . Chaplain provided emotional support through reflective listening, compassionate presence and Ad paperwork completion.  Charm Stenner a Water quality scientist, [employer]

## 2017-04-06 NOTE — Care Management Note (Addendum)
Case Management Note  Patient Details  Name: Dylan [name] MRN: [id number] Date of Birth: 06/13/1965  Subjective/Objective: Pt presented for weakness right leg. Hx significant for hypertension, chronic low back pain with sciatica, and type 2 diabetes mellitus. Pt evaluated by neurology-and underwent workup which demonstrated cord compression at C5-C6, and C6-C7 level due to severe cervical stenosis, patient was also diagnosed with demyelinating disease MS. New onset Atrial Fib- initiated on IV Amio gtt now transitioned to po. Treating with IV antibiotics for E coli and Enterobacter species- plan will be for IV antibiotics cefepime and vancomycin, and date is to be May 16, 2017 via PICC line.        Action/Plan: CSW working with patient in regards to [facility] for SNF. CM will continue to monitor for additional needs.   Expected Discharge Date:               Expected Discharge Plan:  Skilled Nursing Facility  In-House Referral:  Clinical Social Work  Discharge planning Services  CM Consult, Indigent Health Clinic  Post Acute Care Choice:  NA Choice offered to:  NA  DME Arranged:  N/A DME Agency:  NA  HH Arranged:  NA HH Agency:  NA  Status of Service:  Completed, signed off  If discussed at [employer] of Stay Meetings, dates discussed:  04-01-17, 04-06-17, 04-08-17   Additional Comments: 04-09-17 [address] [place], [place] [phone]  Plan will be for d/c to [facility]. CSW has reached out to MD to make them aware that bed is available. No further needs from CM at this time.    04-08-17 [address] [place] [phone] CM did receive phone call  from Triage Nurse at [facility]- pt has been declined due to no [facility] Available at this time. CM did ask if the SNF could work to see if a bed at [facility] would become available and Triage RN stated that pt will have Medicaid and Disability by that time so Rehab will need to be in [place]. CM will make  MD aware. No further needs from CM at this time.    04-08-17 [address], RN, BSN [phone] CM did receive call from the Financial Counselor at [facility] to visit at 2:00 pm for assistance with Disability Application. Pt is agreeable to meet at this time. CM did speak with Hedy at the [facility]- she feels that pt has been denied- CM still awaiting call from the Triage Line to confirm a denial. Will continue to follow.    04-08-17 [address], [place] [phone] CM did call the MD in regards to see if ok to fax information to [facility] in [place]. MD agreeable- information sent and CM did call the Triage Line this am @ [phone] to see if pt is being considered as an Inpatient Referral. Awaiting call back to see if pt is considered or denied. CM will make MD and Patient aware of decision. CSW still following for disposition needs to SNF Placement.  Gala Lewandowsky, RN 04/06/2017, 2:37 PM

## 2017-04-06 NOTE — Progress Notes (Signed)
PROGRESS NOTE    Dylan [name]  ZOX:[id number] DOB: 1965/07/08 DOA: 03/21/2017 PCP: Patient, No Pcp Per   Brief Narrative:   Patient is a 52 y.o. male with history of DM-2, hypertension who presented with generalized weakness but mostly with progressive weakness in his right leg-patient was evaluated by neurology-and underwent workup which demonstrated cord compression at C5-C6, and C6-C7 level due to severe cervical stenosis, patient was also diagnosed with demyelinating disease (MS vs NMO).  Patient was started on IV steroids, and neurosurgery was consulted, patient underwent corpectomy at C6 with decompression on 2/21.  Further hospital course was complicated by development of atrial fibrillation with RVR.  He was briefly placed on Cardizem infusion, anticoagulation with heparin, once his rate his rate was appropriately controlled- plans to discharge to [facility] on 2/26, however patient developed lethargy and discharge was held. However on 2/27, patient was found to have A. fib with rapid ventricular response-requiring initiation of IV amiodarone.  Hospital course now has been complicated by development of fever, leukocytosis and acute kidney injury-unfortunately upon further evaluation with MRI C Spine-which showed either a hematoma or a abscess causing cord compression, subsequently re-evaluated by Neurosurgery, and thought not to be a candidate for repeat surgery given poor chance of any meaningful recovery.     Assessment & Plan:   Principal Problem:   Spinal stenosis, multiple sites in spine Active Problems:   Uncontrolled diabetes mellitus type 2 without complications (HCC)   Hypertension   Bone lesion   Morbid obesity (HCC)   Paroxysmal atrial fibrillation (HCC)   New onset a-fib (HCC)   Multiple sclerosis exacerbation (HCC)   Persistent atrial fibrillation with rapid ventricular response (HCC)   Pressure injury of skin   Palliative care encounter   #) SIRS from unclear source:  Patient developed Sirs response from unclear source possibly related to fluid collection in prior cord compression area versus complicated UTI.  Patient continues on empiric antibiotics though his blood cultures have been unremarkable.   his urine from 04/01/2017 is growing E. coli and Enterobacter species that is resistant only to ampicillin and Augmentin.  ID felt that the patient would benefit from 42 days of antibiotics due to concern for infected cervical epidural hematoma after decompression of the C5 C7 spine. -Continue cefepime and vancomycin, and date is to be May 16, 2017 via PICC line  #) Cervical cord compression: Patient underwent corpectomy decompressive surgery on 03/25/2017 with a hospital course complicated by the development of either an epidural hematoma or abscess causing worsening cord compression on MRI on 04/01/2017.  Patient neurosurgery felt that patient was not a good candidate for re-surgeries there is low chance of any meaningful recovery.  His neurological exam is notable for complete lower extremity weakness and significant upper extremity weakness.  He continues on a taper of dexamethasone.  Per neurology likely some of the spinal lesions seen or secondary to multiple sclerosis or neuromyelitis optica for which she is completed a course of steroids IV. -Continue dexamethasone 3 mg - Patient will need placement and is deciding on skilled nursing facility for both IV antibiotics and physical therapy however will likely need long-term placement in a nursing home  #) Atrial fibrillation with rapid ventricular response: Patient developed possible epidural hematoma after heparin infusion. -Continue amiodarone -Continue diltiazem 30 mg every 8 hours -Echo on 03/30/2017 showed normal EF  #) Type 2 diabetes: Exacerbated by significant amounts of dexamethasone - Increased glargine to 60 units -Continue 6 units of lispro  with meals -Continue sliding scale insulin  #) New  demyelinating disease: Likely multiple sclerosis versus pneumonitis optica per neurology. -Completed course of IV steroids, outpatient follow-up with neurology.    #) Hypertension: -Controlled with Cardizem  Fluids: Tolerating p.o. Electrolytes: Monitor and supplement Nutrition: Carb restricted diet  Prophylaxis: SCDs  Disposition: Likely to skilled nursing facility  DO NOT RESUSCITATE     Consultants:   Neurosurgery  Infectious disease  Neurology  Cardiology  Procedures:  03/30/2017 echo: - Left ventricle: The cavity size was normal. There was mild   concentric hypertrophy. Systolic function was normal. The   estimated ejection fraction was in the range of 50% to 55%.   Although no diagnostic regional wall motion abnormality was   identified, this possibility cannot be completely excluded on the   basis of this study. Doppler parameters are consistent with   abnormal left ventricular relaxation (grade 1 diastolic   dysfunction).  - Left atrium: The atrium was mildly dilated.  Antimicrobials: (specify start and planned stop date. Auto populated tables are space occupying and do not give end dates)  Cefepime and vancomycin started 04/02/2017   Subjective: Patient reports he is doing well.  His mood is fairly improved.  He does not have any complaints or pain anywhere.  Objective: Vitals:   04/05/17 1933 04/05/17 2344 04/06/17 0355 04/06/17 0745  BP: (!) 141/86 140/77 111/70 139/79  Pulse: (!) 59 64 71 61  Resp: 19 16 19  (!) 21  Temp: 98.1 F (36.7 C) 99.2 F (37.3 C) 99.6 F (37.6 C)   TempSrc: Oral Oral Oral Oral  SpO2: 99% 98% 100% 99%  Weight:   (!) 139 kg (306 lb 7 oz)   Height:        Intake/Output Summary (Last 24 hours) at 04/06/2017 1023 Last data filed at 04/06/2017 0858 Gross per 24 hour  Intake 3866.25 ml  Output 4350 ml  Net -483.75 ml   Filed Weights   04/03/17 0434 04/05/17 0348 04/06/17 0355  Weight: 132.4 kg (291 lb 14.2 oz) (!) 139  kg (306 lb 7 oz) (!) 139 kg (306 lb 7 oz)    Examination:  General exam: Appears calm and comfortable  Respiratory system: Clear to auscultation. Respiratory effort normal. Cardiovascular system: Regular regular, no murmurs. Gastrointestinal system: Soft, nondistended, plus bowel sounds Central nervous system: Alert and oriented.  Profound lower extremity weakness, intact lower extremity sensation, diminished reflexes in lower extremities, 3 out of 5 strength in upper extremities primarily in proximal muscle groups minimal strength in distal muscle groups in upper extremities intact sensation in upper extremities Extremities: No lower extremity edema Skin: No rashes on visible skin Psychiatry: Judgement and insight appear normal. Mood & affect appropriate.     Data Reviewed: I have personally reviewed following labs and imaging studies  CBC: Recent Labs  Lab 04/01/17 0328 04/02/17 0403 04/03/17 0233 04/04/17 0628 04/05/17 0521  WBC 25.7* 27.0* 19.1* 13.5* 13.7*  NEUTROABS  --  22.9*  --   --   --   HGB 12.2* 11.5* 11.1* 10.6* 10.3*  HCT 35.5* 33.3* 31.9* 31.5* 30.2*  MCV 87.0 87.2 88.1 88.0 87.5  PLT 198 216 229 233 220   Basic Metabolic Panel: Recent Labs  Lab 04/02/17 0403 04/03/17 0233 04/04/17 0628 04/05/17 0521 04/06/17 0500  NA 144 143 143 140 135  K 4.0 4.5 4.5 4.7 4.9  CL 113* 114* 115* 111 109  CO2 22 21* 21* 20* 19*  GLUCOSE 236* 242*  279* 239* 263*  BUN 49* 46* 50* 44* 45*  CREATININE 1.45* 1.36* 1.55* 1.58* 1.56*  CALCIUM 8.1* 8.0* 8.1* 8.0* 7.9*   GFR: Estimated Creatinine Clearance: 87.3 mL/min (A) (by C-G formula based on SCr of 1.56 mg/dL (H)). Liver Function Tests: Recent Labs  Lab 03/31/17 0753 04/02/17 0403 04/06/17 0500  AST 18 18 40  ALT 36 31 139*  ALKPHOS 45 43 48  BILITOT 0.7 0.8 0.7  PROT 5.9* 5.7* 5.2*  ALBUMIN 2.5* 2.1* 1.8*   No results for input(s): LIPASE, AMYLASE in the last 168 hours. No results for input(s): AMMONIA in  the last 168 hours. Coagulation Profile: No results for input(s): INR, PROTIME in the last 168 hours. Cardiac Enzymes: No results for input(s): CKTOTAL, CKMB, CKMBINDEX, TROPONINI in the last 168 hours. BNP (last 3 results) No results for input(s): PROBNP in the last 8760 hours. HbA1C: No results for input(s): HGBA1C in the last 72 hours. CBG: Recent Labs  Lab 04/05/17 0730 04/05/17 1126 04/05/17 1626 04/05/17 2113 04/06/17 0743  GLUCAP 224* 196* 202* 196* 226*   Lipid Profile: No results for input(s): CHOL, HDL, LDLCALC, TRIG, CHOLHDL, LDLDIRECT in the last 72 hours. Thyroid Function Tests: No results for input(s): TSH, T4TOTAL, FREET4, T3FREE, THYROIDAB in the last 72 hours. Anemia Panel: No results for input(s): VITAMINB12, FOLATE, FERRITIN, TIBC, IRON, RETICCTPCT in the last 72 hours. Sepsis Labs: No results for input(s): PROCALCITON, LATICACIDVEN in the last 168 hours.  Recent Results (from the past 240 hour(s))  Culture, blood (routine x 2)     Status: None (Preliminary result)   Collection Time: 04/01/17 12:30 AM  Result Value Ref Range Status   Specimen Description BLOOD RIGHT ARM  Final   Special Requests IN PEDIATRIC BOTTLE Blood Culture adequate volume  Final   Culture   Final    NO GROWTH 4 DAYS Performed at [facility], [address], [place], [place] [address]    Report Status PENDING  Incomplete  Culture, blood (routine x 2)     Status: None (Preliminary result)   Collection Time: 04/01/17 12:40 AM  Result Value Ref Range Status   Specimen Description BLOOD LEFT HAND  Final   Special Requests IN PEDIATRIC BOTTLE Blood Culture adequate volume  Final   Culture   Final    NO GROWTH 4 DAYS Performed at [facility], [address], [place], [place] [address]    Report Status PENDING  Incomplete  Culture, Urine     Status: Abnormal   Collection Time: 04/01/17  1:11 PM  Result Value Ref Range Status   Specimen Description URINE, RANDOM  Final    Special Requests   Final    NONE Performed at [facility], [address], [place], [place] [address]    Culture (A)  Final    >=100,000 COLONIES/mL ESCHERICHIA COLI 50,000 COLONIES/mL ENTEROBACTER SPECIES    Report Status 04/04/2017 FINAL  Final   Organism ID, Bacteria ESCHERICHIA COLI (A)  Final   Organism ID, Bacteria ENTEROBACTER SPECIES (A)  Final      Susceptibility   Escherichia coli - MIC*    AMPICILLIN >=32 RESISTANT Resistant     CEFAZOLIN 16 SENSITIVE Sensitive     CEFTRIAXONE <=1 SENSITIVE Sensitive     CIPROFLOXACIN <=0.25 SENSITIVE Sensitive     GENTAMICIN <=1 SENSITIVE Sensitive     IMIPENEM <=0.25 SENSITIVE Sensitive     NITROFURANTOIN <=16 SENSITIVE Sensitive     TRIMETH/SULFA <=  20 SENSITIVE Sensitive     AMPICILLIN/SULBACTAM 16 INTERMEDIATE Intermediate     PIP/TAZO <=4 SENSITIVE Sensitive     Extended ESBL NEGATIVE Sensitive     * >=100,000 COLONIES/mL ESCHERICHIA COLI   Enterobacter species - MIC*    CEFAZOLIN >=64 RESISTANT Resistant     CEFTRIAXONE <=1 SENSITIVE Sensitive     CIPROFLOXACIN <=0.25 SENSITIVE Sensitive     GENTAMICIN <=1 SENSITIVE Sensitive     IMIPENEM 1 SENSITIVE Sensitive     NITROFURANTOIN 64 INTERMEDIATE Intermediate     TRIMETH/SULFA <=20 SENSITIVE Sensitive     PIP/TAZO <=4 SENSITIVE Sensitive     * 50,000 COLONIES/mL ENTEROBACTER SPECIES         Radiology Studies: No results found.      Scheduled Meds: . amiodarone  200 mg Oral BID  . B-complex with vitamin C  1 tablet Oral Daily  . dexamethasone  3 mg Intravenous Q8H  . diltiazem  30 mg Oral Q8H  . insulin aspart  0-20 Units Subcutaneous TID WC  . insulin aspart  0-5 Units Subcutaneous QHS  . insulin aspart  6 Units Subcutaneous TID WC  . insulin glargine  60 Units Subcutaneous QHS  . pantoprazole  40 mg Oral Daily  . sodium chloride flush  10-40 mL Intracatheter Q12H  . sodium chloride flush  3 mL Intravenous Q12H   Continuous Infusions: . sodium  chloride 100 mL/hr at 04/06/17 0519  . ceFEPime (MAXIPIME) IV Stopped (04/06/17 0401)  . methocarbamol (ROBAXIN)  IV    . vancomycin 750 mg (04/06/17 0944)     LOS: 16 days    Time spent: 30    Delaine Lame, MD [facility]  If 7PM-7AM, please contact night-coverage [email or web address] Password [email or web address] 04/06/2017, 10:23 AM

## 2017-04-06 NOTE — Progress Notes (Signed)
RN indicated patient's cousin requested call about plans for patient's discharge. CSW met with patient at bedside. Patient alert and oriented and gave permission to speak to cousin, Carlyon Shadow. CSW called [facility] and updated on bed search. [facility] requested information on the two facilities looking at referral by email; CSW emailed [facility] SNF info. CSW advised [facility] that if the two facilities identified so far cannot offer a bed, bed search [place] have to be [place] of county.  CSW continues to await bed offer for [facility] facility. [place] continue to review. CSW updated liaison for these facilities on patient's medications. CSW left voicemail message for liaison this afternoon, awaiting call back. CSW to follow and support with discharge when bed identified.  Dylan [name], [facility]

## 2017-04-06 NOTE — Progress Notes (Signed)
Occupational Therapy Treatment Patient Details Name: Dylan [name] MRN: [id number] DOB: [birth year] Today's Date: 04/06/2017    History of present illness Patient is a 52 y/o male who presents with 7 month hx of progressive weakness, Right>left, s/p 2 falls at home. 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 Cord compression at C5-6 spinal level. MRI are consistent with MS. s/p C5-6, 6-7 ACDF 2/21. PMH includes HTN, DM.   OT comments  Pt demonstrating some progress toward OT goals this session. Facilitated improved B UE strengthening exercises in all planes with pt requiring assistance for full AAROM throughout. Initiated use of therapy putty but pt unable to manipulate today and thus, completed AAROM for B grasp and release. Modified tray set-up so that pt able to drink from cup with straw without assistance when properly set-up. OT will continue to follow while admitted to progress toward functional goals. Updated D/C recommendation as appropriate.    Follow Up Recommendations  Supervision/Assistance - 24 hour;SNF    Equipment Recommendations  Other (comment)(TBD at next venue of care)    Recommendations for Other Services      Precautions / Restrictions Precautions Precautions: Fall Precaution Comments: BLE weakness; watch HR, watch BP's in sitting Restrictions Weight Bearing Restrictions: No Other Position/Activity Restrictions: Collar can be off when in bed, but on with OOB.       Mobility Bed Mobility                  Transfers                      Balance                                           ADL either performed or assessed with clinical judgement   ADL Overall ADL's : Needs assistance/impaired Eating/Feeding: Total  assistance Eating/Feeding Details (indicate cue type and reason): Total assist to bring cup to mouth. Able to modify tray positioning to place cup with straw so that pt can drink when staff not present.                                    General ADL Comments: Session focused on B UE strengthening activities.      Vision   Vision Assessment?: No apparent visual deficits   Perception     Praxis      Cognition Arousal/Alertness: Awake/alert Behavior During Therapy: WFL for tasks assessed/performed Overall Cognitive Status: Within Functional Limits for tasks assessed                                 General Comments: Pt appearing contemplative concerning his situation.         Exercises Exercises: Other exercises Other Exercises Other Exercises: AAROM B UE in all planes including shoulder elevation through flexion, elbow flexion/extension, wrist flexion/extension, digit composite flexion/extenion Other Exercises: Facilitated functional reach cross body engaging core with assistance from therapist.  Other Exercises: BIlateral shoulder shrugs x10   Shoulder Instructions       General Comments  Pertinent Vitals/ Pain       Pain Assessment: No/denies pain  Home Living                                          Prior Functioning/Environment              Frequency  Min 3X/week        Progress Toward Goals  OT Goals(current goals can now be found in the care plan section)  Progress towards OT goals: Progressing toward goals  Acute Rehab OT Goals Patient Stated Goal: walk out of here OT Goal Formulation: With patient Time For Goal Achievement: 04/09/17 Potential to Achieve Goals: Good  Plan Discharge plan remains appropriate    Co-evaluation                 AM-PAC PT "6 Clicks" Daily Activity     Outcome Measure   Help from another person eating meals?: Total Help from another person taking care of  personal grooming?: A Lot Help from another person toileting, which includes using toliet, bedpan, or urinal?: Total Help from another person bathing (including washing, rinsing, drying)?: Total Help from another person to put on and taking off regular upper body clothing?: Total Help from another person to put on and taking off regular lower body clothing?: Total 6 Click Score: 7    End of Session    OT Visit Diagnosis: Muscle weakness (generalized) (M62.81)   Activity Tolerance Patient tolerated treatment well   Patient Left in bed;with call bell/phone within reach   Nurse Communication          Time: 1610-9604 OT Time Calculation (min): 26 min  Charges: OT General Charges $OT Visit: 1 Visit OT Treatments $Therapeutic Activity: 8-22 mins $Therapeutic Exercise: 8-22 mins  Doristine Section, MS OTR/L  Pager: [phone]    Uyen Eichholz A Nicci Vaughan 04/06/2017, 5:27 PM

## 2017-04-06 NOTE — Progress Notes (Signed)
Pt not at a level to tolerate the intensity of an inpt rehab admit. SNF recommended. I have alerted SW. We will sign off at this time. [phone]

## 2017-04-06 NOTE — Progress Notes (Signed)
Initial Nutrition Assessment  DOCUMENTATION CODES:   Morbid obesity  INTERVENTION:    Glucerna Shake po TID, each supplement provides 220 kcal and 10 grams of protein  MVI daily  NUTRITION DIAGNOSIS:   Inadequate oral intake related to poor appetite as evidenced by per patient/family report.  GOAL:   Patient will meet greater than or equal to 90% of their needs  MONITOR:   PO intake, Supplement acceptance, Skin  REASON FOR ASSESSMENT:   LOS    ASSESSMENT:   52 yo male with PMH of HTN, DM, acquired CNS lesion, and A fib with RVR who was admitted on 2/17 with generalized weakness. He was found to have both C5-C6, C6-C7 cord compression and a new demyelinating disorder on MRI. S/P cervical decompression surgery 03/25/17; developed multiple issues post op including new Afib w/ RVR, fever, lethargy, and AKI.  He was found to have a hematoma or possible abscess near the surgical sight that has caused significant weakness from the chest down.  Patient reports that he is eating better now than when first admitted.  He requires full assistance with feeding. He has been consuming 0-100% of meals. He is agreeable to receiving Glucerna Shakes between meals. Labs and medications reviewed. Patient is at risk for skin breakdown due to new immobility and morbid obesity. Will also add MVI daily. Labs reviewed. CBG's: [phone] Medications reviewed and include B complex with vitamin C.  NUTRITION - FOCUSED PHYSICAL EXAM:    Most Recent Value  Orbital Region  No depletion  Upper Arm Region  No depletion  Thoracic and Lumbar Region  No depletion  Buccal Region  No depletion  Temple Region  No depletion  Clavicle Bone Region  No depletion  Clavicle and Acromion Bone Region  No depletion  Scapular Bone Region  No depletion  Dorsal Hand  No depletion  Patellar Region  No depletion  Anterior Thigh Region  No depletion  Posterior Calf Region  No depletion  Edema (RD Assessment)  Mild   Hair  Reviewed  Eyes  Reviewed  Mouth  Reviewed  Skin  Reviewed  Nails  Reviewed       Diet Order:  Diet Carb Modified Fluid consistency: Thin; Room service appropriate? Yes Diet - low sodium heart healthy  EDUCATION NEEDS:   No education needs have been identified at this time  Skin:  Skin Assessment: Skin Integrity Issues: Skin Integrity Issues:: Stage II Stage II: buttocks  Last BM:  3/2  Height:   Ht Readings from Last 1 Encounters:  03/22/17 6\' 4"  (1.93 m)    Weight:   Wt Readings from Last 1 Encounters:  04/06/17 (!) 306 lb 7 oz (139 kg)    Ideal Body Weight:  91.8 kg  BMI:  Body mass index is 37.3 kg/m.  Estimated Nutritional Needs:   Kcal:  2300-2500  Protein:  130-145 gm  Fluid:  2.3-2.5 L    Joaquin Courts, RD, LDN, CNSC Pager [phone] After Hours Pager [phone]

## 2017-04-07 LAB — COMPREHENSIVE METABOLIC PANEL
ALT: 123 U/L — ABNORMAL HIGH (ref 17–63)
Albumin: 2 g/dL — ABNORMAL LOW (ref 3.5–5.0)
Alkaline Phosphatase: 52 U/L (ref 38–126)
Anion gap: 8 (ref 5–15)
CO2: 18 mmol/L — ABNORMAL LOW (ref 22–32)
Calcium: 8 mg/dL — ABNORMAL LOW (ref 8.9–10.3)
Creatinine, Ser: 1.68 mg/dL — ABNORMAL HIGH (ref 0.61–1.24)
GFR calc Af Amer: 54 mL/min — ABNORMAL LOW (ref >60)
Glucose, Bld: 231 mg/dL — ABNORMAL HIGH (ref 65–99)
Sodium: 132 mmol/L — ABNORMAL LOW (ref 135–145)
Total Bilirubin: 0.7 mg/dL (ref 0.3–1.2)
Total Protein: 5.3 g/dL — ABNORMAL LOW (ref 6.5–8.1)

## 2017-04-07 LAB — COMPREHENSIVE METABOLIC PANEL WITH GFR
AST: 27 U/L (ref 15–41)
BUN: 45 mg/dL — ABNORMAL HIGH (ref 6–20)
Chloride: 106 mmol/L (ref 101–111)
GFR calc non Af Amer: 46 mL/min — ABNORMAL LOW (ref >60)
Potassium: 5.2 mmol/L — ABNORMAL HIGH (ref 3.5–5.1)

## 2017-04-07 LAB — GLUCOSE, CAPILLARY
GLUCOSE-CAPILLARY: 232 mg/dL — AB (ref 65–99)
GLUCOSE-CAPILLARY: 189 mg/dL — AB (ref 65–99)
GLUCOSE-CAPILLARY: 247 mg/dL — AB (ref 65–99)
GLUCOSE-CAPILLARY: 247 mg/dL — AB (ref 65–99)
Glucose-Capillary: 186 mg/dL — ABNORMAL HIGH (ref 65–99)

## 2017-04-07 LAB — CBC
HCT: 29.9 % — ABNORMAL LOW (ref 39.0–52.0)
Hemoglobin: 10.5 g/dL — ABNORMAL LOW (ref 13.0–17.0)
MCH: 30.1 pg (ref 26.0–34.0)
MCHC: 35.1 g/dL (ref 30.0–36.0)
MCV: 85.7 fL (ref 78.0–100.0)
Platelets: 216 K/uL (ref 150–400)
RBC: 3.49 MIL/uL — ABNORMAL LOW (ref 4.22–5.81)
RDW: 13 % (ref 11.5–15.5)
WBC: 19.3 K/uL — ABNORMAL HIGH (ref 4.0–10.5)

## 2017-04-07 LAB — MAGNESIUM: Magnesium: 2.3 mg/dL (ref 1.7–2.4)

## 2017-04-07 LAB — VANCOMYCIN, TROUGH: Vancomycin Tr: 19 ug/mL (ref 15–20)

## 2017-04-07 MED ORDER — TRAZODONE HCL 50 MG PO TABS
50.0000 mg | ORAL_TABLET | Freq: Every evening | ORAL | Status: DC | PRN
  Administered 2017-04-07: 50 mg via ORAL
  Filled 2017-04-07: qty 1

## 2017-04-07 MED ORDER — VANCOMYCIN HCL 500 MG IV SOLR
500.0000 mg | Freq: Two times a day (BID) | INTRAVENOUS | Status: DC
  Filled 2017-04-07: qty 500

## 2017-04-07 MED ORDER — VANCOMYCIN HCL 500 MG IV SOLR
500.0000 mg | Freq: Two times a day (BID) | INTRAVENOUS | Status: DC
  Administered 2017-04-08 – 2017-04-09 (×3): 500 mg via INTRAVENOUS
  Filled 2017-04-07 (×5): qty 500

## 2017-04-07 NOTE — Progress Notes (Signed)
Inpatient Diabetes Program Recommendations  AACE/ADA: New Consensus Statement on Inpatient Glycemic Control (2015)  Target Ranges:  Prepandial:   less than 140 mg/dL      Peak postprandial:   less than 180 mg/dL (1-2 hours)      Critically ill patients:  140 - 180 mg/dL   Lab Results  Component Value Date   GLUCAP 186 (H) 04/07/2017   HGBA1C 8.4 (H) 03/22/2017    Review of Glycemic Control Results for DOEL, HEITING (MRN [id number]) as of 04/07/2017 10:43  Ref. Range 04/06/2017 11:54 04/06/2017 16:51 04/06/2017 21:42 04/07/2017 07:31  Glucose-Capillary Latest Ref Range: 65 - 99 mg/dL 009 (H) 233 (H) 007 (H) 186 (H)   Diabetes history: Type 2 DM Outpatient Diabetes medications: Metformin 500mg  QD Current orders for Inpatient glycemic control: Lantus 60 units QHS, Novolog 6 Units TIDAC, Novolog 0-20 Units TID, Novolog 0-5 Units QHS Decadron 3 mg q 8 hours   Inpatient Diabetes Program Recommendations:    Consider increasing Novolog to 9 Units TIDAC (assuming that patient eats >50% of meal, may need further adjustments if steroids are decreased or tapered).  Thanks, Lujean Rave, MSN, RNC-OB Diabetes Coordinator [phone] (8a-5p)

## 2017-04-07 NOTE — Progress Notes (Signed)
PHARMACY CONSULT NOTE FOR:  OUTPATIENT  PARENTERAL ANTIBIOTIC THERAPY (OPAT) 52 y.o. male admitted on 03/21/2017 with possible sepsis vs post-op cervical abscess/infection. Pharmacy has been consulted for vancomycin and cefepime dosing. ID is following.  Patient on antibiotics for suspected spinal infection.  Vancomycin trough drawn tonight was in range at 39mcg/mL, but trended up after a dose decrease earlier in the week. SCr has still been slowly increasing.  Plan: Decrease vancomycin to 500mg  IV q12h with next dose Continue Cefepime 2g IV q12h OPAT orders as below   Indication: Spinal Abscess Regimen: Vancomycin 500 mg IV every 12 hours       Cefepime 2 gram IV every 12 hours End date: 05/16/2017  IV antibiotic discharge orders are pended. To discharging provider:  please sign these orders via discharge navigator,  Select New Orders & click on the button choice - Manage This Unsigned Work.      Thank you for allowing pharmacy to be a part of this patient's care.  Shaivi Rothschild D. Cherene Dobbins, PharmD, BCPS Clinical Pharmacist 04/07/2017 8:36 PM

## 2017-04-07 NOTE — Progress Notes (Signed)
PT Cancellation Note  Patient Details Name: Dylan [name] MRN: [id number] DOB: [birth year]   Cancelled Treatment:    Reason Eval/Treat Not Completed: Fatigue/lethargy limiting ability to participate. Will follow-up for PT treatment as schedule permits.  Ina Homes, PT, DPT Acute Rehab Services  Pager: [phone]  Malachy Chamber 04/07/2017, 11:29 AM

## 2017-04-07 NOTE — Progress Notes (Signed)
Patient now has confirmed bed offers from [facility] and [facility]. Either facility will accept the LOG. CSW gave bed offers to patient. Patient to discuss with family and give choice. Paged MD. CSW to follow and support with discharge when medically ready.  Abigail Butts, LCSWA [phone]

## 2017-04-07 NOTE — Progress Notes (Signed)
PROGRESS NOTE  Dylan [name] IHK:[id number] DOB: 03/14/1965 DOA: 03/21/2017 PCP: Patient, No Pcp Per  HPI/Recap of past 24 hours:  Dylan [name] is a 52 y.o. 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  Treatment included IV steroids and spinal decompression on 2/21 by neurosurgery.  Hospital course complicated by new onset atrial fibrillation with RVR requiring initiation of amiodarone drip on 2/27 which hass since been transitioned to oral amiodarone and diltiazem.  Anticoagulation was discontinued due to concern for possible hematoma of the spinal cord found on MRI of the C spine.  It showed fluid collection with resultant recurrent cord compression.  Neurosurgery evaluated patient but felt he was not a candidate for repeat surgery given the poor chance of meaningful recovery.   Interval History Spiked fever to 102 last night.  White count elevated at 19 this morning.  He denies any subjective fevers or worsening pain.  Continues to endorse significant weakness in the lower extremities.  No new cough.  Assessment/Plan: Principal Problem:   Spinal stenosis, multiple sites in spine Active Problems:   Uncontrolled diabetes mellitus type 2 without complications (HCC)   Hypertension   Bone lesion   Morbid obesity (HCC)   Paroxysmal atrial fibrillation (HCC)   New onset a-fib (HCC)   Multiple sclerosis exacerbation (HCC)   Persistent atrial fibrillation with rapid ventricular response (HCC)   Pressure injury of skin   Palliative care encounter   #Sirs, unclear source high suspicion related to fluid collection with spinal cord versus conjugated UTI Worsening leukocytosis and recurrent fever in the past 24 hours Possibly related to likely infected spinal  hematoma -Obtain repeat blood cultures -Continue empiric vancomycin and cefepime, plan for end date 05/16/17  #Cervical cord compression, worse, status post corpectomy/decompressive surgery on 03/25/17 complicated by fluid collection (epidural hematoma or abscess) Repeat MRI on 2/28 showed worsening cord compression related to fluid collection mentioned above Patient not a surgical candidate per neurosurgery Neurologic exam was complete lower extremity weakness greater than upper bilateral extremities -Continue oral dexamethasone taper -Plan for skilled nursing facility to continue IV antibiotics and physical rehabilitation  #Atrial fibrillation, rate controlled -Continue amiodarone and diltiazem  #Type 2 diabetes, worsened in setting of steroids -Lantus 60 units, correction coverage time with meals, sliding scale  #AKI, c/b hyperkalemia and Non anion gap metabolic acidosis stable Previous baseline 0.8-1, currently 1.68 Suspect pre-renal, but also consider nephrotoxicity from vancomycin -f/u vancomycin trough -continue IVF  Code Status: DNR  Family Communication: no family at bedside   Disposition Plan: Monitor over the next 24 hours to assure no repeat fevers or worsening leukocytosis, follow-up blood cultures   Consultants:  Neurosurgery  Infectious disease  Neurology  Cardiology  Procedures:  Echo on 03/30/17 mild concentric hypertrophy of left ventricle, EF 50-55%.  Grade 1 diastolic dysfunction.  Left atrium mildly dilated  Antimicrobials:  Vancomycin: 04/02/17  Cefepime: 04/02/17  Cultures:  3/6: blood cultures   2/18: CSF (anaerobic, fungal, CSF):negative  2/28: Blood culture negative up-to-date  2/28 urine culture: E. coli (ampicillin resistant, Augmentin intermediate)  Telemetry:Yes  DVT prophylaxis:  SCDs   Objective: Vitals:   04/07/17 0023 04/07/17 0424 04/07/17 0820 04/07/17 1157  BP: (!) 142/76 (!) 144/78 (!) 150/84 130/64  Pulse: 64 75 68  62  Resp: (!) 21 (!) 21 (!)  21 (!) 21  Temp: 99.4 F (37.4 C) 99.5 F (37.5 C) 99.3 F (37.4 C) 98.5 F (36.9 C)  TempSrc: Oral Oral Oral Axillary  SpO2: 97% 100% 100% 100%  Weight:  (!) 140.6 kg (309 lb 15.5 oz)    Height:        Intake/Output Summary (Last 24 hours) at 04/07/2017 1545 Last data filed at 04/07/2017 1242 Gross per 24 hour  Intake 4577 ml  Output 4650 ml  Net -73 ml   Filed Weights   04/05/17 0348 04/06/17 0355 04/07/17 0424  Weight: (!) 139 kg (306 lb 7 oz) (!) 139 kg (306 lb 7 oz) (!) 140.6 kg (309 lb 15.5 oz)    Exam:  General: Lying in bed comfortably,no apparent distress Neck: normal, no thyromegaly Cardiovascular: regular rate and rhythm, no murmurs, rubs or gallops, no edema Respiratory: Normal respiratory effort on room air, lungs clear to auscultation bilaterally Abdomen: soft, non-distended, non-tender, normal bowel sounds Skin: No Rash Neurologic:Mental status AAOx3. 0/5 strength in lower extremities ( not able to move across plane of bed), 3/5 strength in proximal upper extremities, minimal strength in distal upper extremities ( almost no tone in hands, no grip strength) Psychiatric:Appropriate affect, and mood  Data Reviewed: CBC: Recent Labs  Lab 04/02/17 0403 04/03/17 0233 04/04/17 0628 04/05/17 0521 04/07/17 0520  WBC 27.0* 19.1* 13.5* 13.7* 19.3*  NEUTROABS 22.9*  --   --   --   --   HGB 11.5* 11.1* 10.6* 10.3* 10.5*  HCT 33.3* 31.9* 31.5* 30.2* 29.9*  MCV 87.2 88.1 88.0 87.5 85.7  PLT 216 229 233 220 216   Basic Metabolic Panel: Recent Labs  Lab 04/03/17 0233 04/04/17 0628 04/05/17 0521 04/06/17 0500 04/07/17 0520  NA 143 143 140 135 132*  K 4.5 4.5 4.7 4.9 5.2*  CL 114* 115* 111 109 106  CO2 21* 21* 20* 19* 18*  GLUCOSE 242* 279* 239* 263* 231*  BUN 46* 50* 44* 45* 45*  CREATININE 1.36* 1.55* 1.58* 1.56* 1.68*  CALCIUM 8.0* 8.1* 8.0* 7.9* 8.0*  MG  --   --   --   --  2.3   GFR: Estimated Creatinine Clearance: 81.5  mL/min (A) (by C-G formula based on SCr of 1.68 mg/dL (H)). Liver Function Tests: Recent Labs  Lab 04/02/17 0403 04/06/17 0500 04/07/17 0520  AST 18 40 27  ALT 31 139* 123*  ALKPHOS 43 48 52  BILITOT 0.8 0.7 0.7  PROT 5.7* 5.2* 5.3*  ALBUMIN 2.1* 1.8* 2.0*   No results for input(s): LIPASE, AMYLASE in the last 168 hours. No results for input(s): AMMONIA in the last 168 hours. Coagulation Profile: No results for input(s): INR, PROTIME in the last 168 hours. Cardiac Enzymes: No results for input(s): CKTOTAL, CKMB, CKMBINDEX, TROPONINI in the last 168 hours. BNP (last 3 results) No results for input(s): PROBNP in the last 8760 hours. HbA1C: No results for input(s): HGBA1C in the last 72 hours. CBG: Recent Labs  Lab 04/06/17 1154 04/06/17 1651 04/06/17 2142 04/07/17 0731 04/07/17 1059  GLUCAP 361* 265* 241* 186* 232*   Lipid Profile: No results for input(s): CHOL, HDL, LDLCALC, TRIG, CHOLHDL, LDLDIRECT in the last 72 hours. Thyroid Function Tests: No results for input(s): TSH, T4TOTAL, FREET4, T3FREE, THYROIDAB in the last 72 hours. Anemia Panel: No results for input(s): VITAMINB12, FOLATE, FERRITIN, TIBC, IRON, RETICCTPCT in the last 72 hours. Urine analysis:    Component Value Date/Time   COLORURINE AMBER (A) 04/01/2017 1218  APPEARANCEUR CLOUDY (A) 04/01/2017 1218   LABSPEC 1.017 04/01/2017 1218   PHURINE 5.0 04/01/2017 1218   GLUCOSEU NEGATIVE 04/01/2017 1218   HGBUR LARGE (A) 04/01/2017 1218   BILIRUBINUR NEGATIVE 04/01/2017 1218   KETONESUR NEGATIVE 04/01/2017 1218   PROTEINUR 30 (A) 04/01/2017 1218   NITRITE NEGATIVE 04/01/2017 1218   LEUKOCYTESUR LARGE (A) 04/01/2017 1218   Sepsis Labs: @LABRCNTIP (procalcitonin:4,lacticidven:4)  ) Recent Results (from the past 240 hour(s))  Culture, blood (routine x 2)     Status: None   Collection Time: 04/01/17 12:30 AM  Result Value Ref Range Status   Specimen Description BLOOD RIGHT ARM  Final   Special  Requests IN PEDIATRIC BOTTLE Blood Culture adequate volume  Final   Culture   Final    NO GROWTH 5 DAYS Performed at [facility], [address], [place], [place] [address]    Report Status 04/06/2017 FINAL  Final  Culture, blood (routine x 2)     Status: None   Collection Time: 04/01/17 12:40 AM  Result Value Ref Range Status   Specimen Description BLOOD LEFT HAND  Final   Special Requests IN PEDIATRIC BOTTLE Blood Culture adequate volume  Final   Culture   Final    NO GROWTH 5 DAYS Performed at [facility], [address], [place], [place] [address]    Report Status 04/06/2017 FINAL  Final  Culture, Urine     Status: Abnormal   Collection Time: 04/01/17  1:11 PM  Result Value Ref Range Status   Specimen Description URINE, RANDOM  Final   Special Requests   Final    NONE Performed at [facility], [address] [place], [place] [address]    Culture (A)  Final    >=100,000 COLONIES/mL ESCHERICHIA COLI 50,000 COLONIES/mL ENTEROBACTER SPECIES    Report Status 04/04/2017 FINAL  Final   Organism ID, Bacteria ESCHERICHIA COLI (A)  Final   Organism ID, Bacteria ENTEROBACTER SPECIES (A)  Final      Susceptibility   Escherichia coli - MIC*    AMPICILLIN >=32 RESISTANT Resistant     CEFAZOLIN 16 SENSITIVE Sensitive     CEFTRIAXONE <=1 SENSITIVE Sensitive     CIPROFLOXACIN <=0.25 SENSITIVE Sensitive     GENTAMICIN <=1 SENSITIVE Sensitive     IMIPENEM <=0.25 SENSITIVE Sensitive     NITROFURANTOIN <=16 SENSITIVE Sensitive     TRIMETH/SULFA <=20 SENSITIVE Sensitive     AMPICILLIN/SULBACTAM 16 INTERMEDIATE Intermediate     PIP/TAZO <=4 SENSITIVE Sensitive     Extended ESBL NEGATIVE Sensitive     * >=100,000 COLONIES/mL ESCHERICHIA COLI   Enterobacter species - MIC*    CEFAZOLIN >=64 RESISTANT Resistant     CEFTRIAXONE <=1 SENSITIVE Sensitive     CIPROFLOXACIN <=0.25 SENSITIVE Sensitive     GENTAMICIN <=1 SENSITIVE Sensitive     IMIPENEM 1 SENSITIVE Sensitive      NITROFURANTOIN 64 INTERMEDIATE Intermediate     TRIMETH/SULFA <=20 SENSITIVE Sensitive     PIP/TAZO <=4 SENSITIVE Sensitive     * 50,000 COLONIES/mL ENTEROBACTER SPECIES      Studies: No results found.  Scheduled Meds: . amiodarone  200 mg Oral BID  . B-complex with vitamin C  1 tablet Oral Daily  . dexamethasone  3 mg Intravenous Q8H  . diltiazem  30 mg Oral Q8H  . feeding supplement (GLUCERNA SHAKE)  237 mL Oral TID BM  . insulin aspart  0-20 Units Subcutaneous TID WC  .  insulin aspart  0-5 Units Subcutaneous QHS  . insulin aspart  6 Units Subcutaneous TID WC  . insulin glargine  60 Units Subcutaneous QHS  . pantoprazole  40 mg Oral Daily  . sodium chloride flush  10-40 mL Intracatheter Q12H  . sodium chloride flush  3 mL Intravenous Q12H    Continuous Infusions: . sodium chloride 100 mL/hr at 04/07/17 0354  . ceFEPime (MAXIPIME) IV Stopped (04/07/17 1432)  . methocarbamol (ROBAXIN)  IV    . vancomycin Stopped (04/07/17 1048)     LOS: 17 days     Laverna Peace, MD [facility] Pager [phone]  If 7PM-7AM, please contact night-coverage [email or web address] Password [facility] 04/07/2017, 3:45 PM

## 2017-04-08 DIAGNOSIS — E875 Hyperkalemia: Secondary | ICD-10-CM | POA: Diagnosis not present

## 2017-04-08 DIAGNOSIS — N179 Acute kidney failure, unspecified: Secondary | ICD-10-CM | POA: Diagnosis present

## 2017-04-08 LAB — CBC
HCT: 29 % — ABNORMAL LOW (ref 39.0–52.0)
HEMOGLOBIN: 10.1 g/dL — AB (ref 13.0–17.0)
MCH: 29.4 pg (ref 26.0–34.0)
MCHC: 34.8 g/dL (ref 30.0–36.0)
MCV: 84.5 fL (ref 78.0–100.0)
Platelets: 207 10*3/uL (ref 150–400)
RBC: 3.43 MIL/uL — ABNORMAL LOW (ref 4.22–5.81)
RDW: 12.7 % (ref 11.5–15.5)
WBC: 16.8 10*3/uL — ABNORMAL HIGH (ref 4.0–10.5)

## 2017-04-08 LAB — URINALYSIS, ROUTINE W REFLEX MICROSCOPIC
BILIRUBIN URINE: NEGATIVE
Glucose, UA: NEGATIVE mg/dL
KETONES UR: NEGATIVE mg/dL
NITRITE: NEGATIVE
PROTEIN: NEGATIVE mg/dL
SPECIFIC GRAVITY, URINE: 1.01 (ref 1.005–1.030)
Squamous Epithelial / LPF: NONE SEEN
pH: 5 (ref 5.0–8.0)

## 2017-04-08 LAB — NA AND K (SODIUM & POTASSIUM), RAND UR
Potassium Urine: 25 mmol/L
Sodium, Ur: 79 mmol/L

## 2017-04-08 LAB — BASIC METABOLIC PANEL
Anion gap: 9 (ref 5–15)
BUN: 49 mg/dL — ABNORMAL HIGH (ref 6–20)
CHLORIDE: 109 mmol/L (ref 101–111)
CO2: 16 mmol/L — AB (ref 22–32)
Calcium: 8 mg/dL — ABNORMAL LOW (ref 8.9–10.3)
Creatinine, Ser: 1.75 mg/dL — ABNORMAL HIGH (ref 0.61–1.24)
GFR calc Af Amer: 51 mL/min — ABNORMAL LOW (ref >60)
GFR calc non Af Amer: 44 mL/min — ABNORMAL LOW (ref >60)
GLUCOSE: 189 mg/dL — AB (ref 65–99)
POTASSIUM: 6 mmol/L — AB (ref 3.5–5.1)
Sodium: 134 mmol/L — ABNORMAL LOW (ref 135–145)

## 2017-04-08 LAB — GLUCOSE, CAPILLARY
GLUCOSE-CAPILLARY: 211 mg/dL — AB (ref 65–99)
Glucose-Capillary: 206 mg/dL — ABNORMAL HIGH (ref 65–99)
Glucose-Capillary: 200 mg/dL — ABNORMAL HIGH (ref 65–99)
Glucose-Capillary: 208 mg/dL — ABNORMAL HIGH (ref 65–99)

## 2017-04-08 LAB — POTASSIUM
Potassium: 5.1 mmol/L (ref 3.5–5.1)
Potassium: 5.2 mmol/L — ABNORMAL HIGH (ref 3.5–5.1)

## 2017-04-08 LAB — CREATININE, URINE, RANDOM: CREATININE, URINE: 34.82 mg/dL

## 2017-04-08 MED ORDER — SODIUM POLYSTYRENE SULFONATE 15 GM/60ML PO SUSP
15.0000 g | Freq: Once | ORAL | Status: DC
  Filled 2017-04-08: qty 60

## 2017-04-08 MED ORDER — SODIUM POLYSTYRENE SULFONATE PO POWD
15.0000 g | Freq: Once | ORAL | Status: AC
  Administered 2017-04-08: 15 g via ORAL
  Filled 2017-04-08: qty 15

## 2017-04-08 NOTE — Progress Notes (Signed)
CSW met with patient at bedside. Patient has discussed bed offers with family and friends and has chosen [facility]. CSW and patient also briefly discussed patient's plans for continued care. Patient hopeful to improve in rehab and return home eventually, though plans to find a new place to live, as his current apartment is on the second floor. Patient indicated he is discussing with family and friends and is aware he will likely need someone to help care for him in the home if he is able to return home. CSW offered encouragement and hope for recovery.    CSW updated [facility]'s liaison on patient's choice. CSW following for medical readiness and will support with discharge to [facility].  Estanislado Emms, [place]

## 2017-04-08 NOTE — Progress Notes (Signed)
CSW received request from [facility] to assist with disability application at bedside. Thermon Leyland from [facility] was at bedside assisting patient with application, but patient was unable to sign or hold pen to make an X on the application. Family member is allowed to sign for patient in this event; patient called family member while CSW and Lawson Fiscal present. Patient's family member will meet Lawson Fiscal and patient at bedside tomorrow morning at 9 am to complete signatures on the application. CSW will be available for continued support.  CSW to support with discharge to [facility] when medically stable.  Abigail Butts, LCSWA [phone]

## 2017-04-08 NOTE — Progress Notes (Signed)
Later day Progress Note    04/08/17 1721  PT Visit Information  Last PT Received On 04/08/17  Assistance Needed +2  PT/OT/SLP Co-Evaluation/Treatment Yes  Reason for Co-Treatment Complexity of the patient's impairments (multi-system involvement)  PT goals addressed during session Mobility/safety with mobility  History of Present Illness Patient is a 52 y/o male who presents with 7 month hx of progressive weakness, Right>left, s/p 2 falls at home. 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 Cord compression at C5-6 spinal level. MRI are consistent with MS. s/p C5-6, 6-7 ACDF 2/21. PMH includes HTN, DM.  Subjective Data  Subjective This was the best I've felt in 2 weeks.  Patient Stated Goal walk out of here  Precautions  Precautions Fall  Restrictions  Weight Bearing Restrictions No  Pain Assessment  Pain Assessment Faces  Faces Pain Scale 4  Pain Location generalized  Pain Descriptors / Indicators Grimacing  Pain Intervention(s) Monitored during session  Cognition  Arousal/Alertness Awake/alert  Behavior During Therapy WFL for tasks assessed/performed  Overall Cognitive Status Within Functional Limits for tasks assessed  Bed Mobility  Overal bed mobility Needs Assistance  Bed Mobility Rolling  Rolling Max assist;+2 for physical assistance  General bed mobility comments Multimodal cues for rolling for pad placement  Transfers  Overall transfer level Needs assistance  Transfer via Lift Equipment Maximove  General transfer comment Total assist with use of maxi move for chair > bed  Other Exercises  Other Exercises Encouraged pt to request OOB to chair every day with RN staff  PT - End of Session  Activity Tolerance Patient tolerated treatment well;Patient limited by fatigue  Patient left in  bed;with call bell/phone within reach;Other (comment) (worked on soft touch system)  Nurse Communication Mobility status;Need for lift equipment  PT - Assessment/Plan  PT Plan Current plan remains appropriate  PT Visit Diagnosis Muscle weakness (generalized) (M62.81);Other abnormalities of gait and mobility (R26.89)  PT Frequency (ACUTE ONLY) Min 3X/week  Follow Up Recommendations SNF;Supervision/Assistance - 24 hour  AM-PAC PT "6 Clicks" Daily Activity Outcome Measure  Difficulty turning over in bed (including adjusting bedclothes, sheets and blankets)? 1  Difficulty moving from lying on back to sitting on the side of the bed?  1  Difficulty sitting down on and standing up from a chair with arms (e.g., wheelchair, bedside commode, etc,.)? 1  Help needed moving to and from a bed to chair (including a wheelchair)? 1  Help needed walking in hospital room? 1  Help needed climbing 3-5 steps with a railing?  1  6 Click Score 6  Mobility G Code  CN  PT Goal Progression  Progress towards PT goals Progressing toward goals  Acute Rehab PT Goals  Time For Goal Achievement 04/15/17  Potential to Achieve Goals Fair  PT Time Calculation  PT Start Time (ACUTE ONLY) 1620  PT Stop Time (ACUTE ONLY) 1659  PT Time Calculation (min) (ACUTE ONLY) 39 min  PT General Charges  $$ ACUTE PT VISIT 1 Visit  PT Treatments  $Therapeutic Activity 8-22 mins    04/08/2017  [place] Bing, PT [phone]  (pager)

## 2017-04-08 NOTE — Progress Notes (Signed)
Occupational Therapy Treatment Patient Details Name: Dylan [name] MRN: [id number] DOB: 06-25-1965 Today's Date: 04/08/2017    History of present illness Patient is a 52 y/o male who presents with 7 month hx of progressive weakness, Right>left, s/p 2 falls at home. 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 Cord compression at C5-6 spinal level. MRI are consistent with MS. s/p C5-6, 6-7 ACDF 2/21. PMH includes HTN, DM.   OT comments  Utilized maximove for transfer chair to bed. Trialed use of soft touch call button for increased independence for ability to call RN/tech. Pt continues to demonstrate significant weakness in bil UE/LEs. Discussed use of AE and utensils for increased independence with self feeding and grooming tasks as able; would be beneficial to trial next session. D/c plan remains appropriate. Will continue to follow acutely.   Follow Up Recommendations  Supervision/Assistance - 24 hour;SNF    Equipment Recommendations  Other (comment)(TBD at next venue)    Recommendations for Other Services      Precautions / Restrictions Precautions Precautions: Fall Restrictions Weight Bearing Restrictions: No       Mobility Bed Mobility Overal bed mobility: Needs Assistance Bed Mobility: Rolling Rolling: Max assist;+2 for physical assistance    General bed mobility comments: Multimodal cues for rolling for pad placement  Transfers Overall transfer level: Needs assistance               General transfer comment: Total assist with use of maxi move for chair > bed    Balance                                      ADL either performed or assessed with clinical judgement   ADL                                         General ADL Comments: Total  assist with use of maxi move for transfer chair>bed. Replaced soft touch call button and trialed with pt so he could actually use     Vision       Perception     Praxis      Cognition Arousal/Alertness: Awake/alert Behavior During Therapy: WFL for tasks assessed/performed Overall Cognitive Status: Within Functional Limits for tasks assessed                                          Exercises Other Exercises Other Exercises: Encouraged pt to request OOB to chair every day with RN staff    Shoulder Instructions       General Comments vss    Pertinent Vitals/ Pain       Pain Assessment: Faces Faces Pain Scale: Hurts little more Pain Location: generalized Pain Descriptors / Indicators: Grimacing Pain Intervention(s): Monitored during session;Limited activity within patient's tolerance;Repositioned  Home Living  Prior Functioning/Environment              Frequency  Min 3X/week        Progress Toward Goals  OT Goals(current goals can now be found in the care plan section)  Progress towards OT goals: Progressing toward goals  Acute Rehab OT Goals Patient Stated Goal: walk out of here OT Goal Formulation: With patient  Plan Discharge plan remains appropriate    Co-evaluation    PT/OT/SLP Co-Evaluation/Treatment: Yes Reason for Co-Treatment: Complexity of the patient's impairments (multi-system involvement)   OT goals addressed during session: Strengthening/ROM      AM-PAC PT "6 Clicks" Daily Activity     Outcome Measure   Help from another person eating meals?: Total Help from another person taking care of personal grooming?: Total Help from another person toileting, which includes using toliet, bedpan, or urinal?: Total Help from another person bathing (including washing, rinsing, drying)?: Total Help from another person to put on and taking off regular upper body  clothing?: Total Help from another person to put on and taking off regular lower body clothing?: Total 6 Click Score: 6    End of Session    OT Visit Diagnosis: Muscle weakness (generalized) (M62.81)   Activity Tolerance Patient tolerated treatment well   Patient Left in bed;with call bell/phone within reach   Nurse Communication Mobility status        Time: 4098-1191 OT Time Calculation (min): 39 min  Charges: OT General Charges $OT Visit: 1 Visit OT Treatments $Therapeutic Activity: 23-37 mins  Chisum Habenicht A. Brett Albino, M.S., OTR/L Pager: [phone]   Gaye Alken 04/08/2017, 5:16 PM

## 2017-04-08 NOTE — Progress Notes (Signed)
Inpatient Diabetes Program Recommendations  AACE/ADA: New Consensus Statement on Inpatient Glycemic Control (2015)  Target Ranges:  Prepandial:   less than 140 mg/dL      Peak postprandial:   less than 180 mg/dL (1-2 hours)      Critically ill patients:  140 - 180 mg/dL   Lab Results  Component Value Date   GLUCAP 206 (H) 04/08/2017   HGBA1C 8.4 (H) 03/22/2017    Review of Glycemic Control Results for Dylan [name], Dylan [name] (MRN [id number]) as of 04/08/2017 10:20  Ref. Range 04/07/2017 16:18 04/07/2017 20:40 04/07/2017 21:25 04/08/2017 07:41  Glucose-Capillary Latest Ref Range: 65 - 99 mg/dL 007 (H) 121 (H) 975 (H) 206 (H)   Diabetes history: Type 2 DM Outpatient Diabetes medications: Metformin 500mg  QD Current orders for Inpatient glycemic control: Lantus 60 units QHS, Novolog 6 Units TIDAC, Novolog 0-20 Units TID, Novolog 0-5 Units QHS Decadron 3 mg q 8 hours   Inpatient Diabetes Program Recommendations:    In the setting of continued use of steroids and infection, consider increasing Novolog to 9 Units [facility] (assuming that patient eats >50% of meal, may need further adjustments if steroids are decreased or tapered).    Thanks, Lujean Rave, MSN, RNC-OB Diabetes Coordinator [phone] (8a-5p)

## 2017-04-08 NOTE — Progress Notes (Signed)
PROGRESS NOTE  Dylan [name] ZOX:[id number] DOB: April 10, 1965 DOA: 03/21/2017 PCP: Patient, No Pcp Per  HPI/Recap of past 24 hours:  Dylan [name] is a 52 y.o. 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  Treatment included IV steroids and spinal decompression on 2/21 by neurosurgery.  Hospital course complicated by new onset atrial fibrillation with RVR requiring initiation of amiodarone drip on 2/27 which has since been transitioned to oral amiodarone and diltiazem.  Anticoagulation was discontinued due to concern for possible hematoma of the spinal cord found on MRI of the C spine on 03/25/17.  It showed fluid collection with resultant recurrent cord compression.  Neurosurgery evaluated patient but felt he was not a candidate for evacuation of hematoma given the poor chance of meaningful recovery and the high risk (recurrent hematoma, airway compromise, high risk of mortality/morbidity) associated with his complicated procedure.   Interval History Afebrile overnight.  Improved sleep.  No other complaints.  Assessment/Plan: Principal Problem:   Spinal stenosis, multiple sites in spine Active Problems:   Uncontrolled diabetes mellitus type 2 without complications (HCC)   Hypertension   Bone lesion   Morbid obesity (HCC)   Paroxysmal atrial fibrillation (HCC)   New onset a-fib (HCC)   Multiple sclerosis exacerbation (HCC)   Persistent atrial fibrillation with rapid ventricular response (HCC)   Pressure injury of skin   Palliative care encounter   Hyperkalemia   AKI (acute kidney injury) (HCC)   #Sirs, unclear source high suspicion related to fluid collection with spinal cord versus complicated UTI, improving Afebrile, improved leukocytosis in past 24  hours -Repeat blood cultures remain negative -Continue empiric vancomycin and cefepime, plan for end date 05/16/17  #Cervical cord compression, worse, Related to severe spinal stenosis and MS Status post corpectomy/decompressive surgery on 03/25/17, not a repeat surgical candidate Complicated by fluid collection (epidural hematoma or abscess) found on repeat MRI on 2/28 Neurologic exam with complete lower extremity weakness greater than upper bilateral extremities -Plan for skilled nursing facility to continue IV antibiotics and physical rehabilitation  #Multiple sclerosis, new diagnosis on admission -Continue oral dexamethasone taper(currently 3 mg q8 h day 5, previously 4mg  q6h)  #Atrial fibrillation, rate controlled -Continue amiodarone and diltiazem  #Type 2 diabetes, worsened in setting of steroids -Lantus 60 units, correction coverage time with meals, sliding scale  #AKI, c/b hyperkalemia and Non anion gap metabolic acidosis, stable Previous baseline 0.8-1, currently 1.75 vanc trough wnl -?nephrotoxic combo from vanc and cefepime? -kayexylate, UA, FeNA -EKG -monitor BMP -continue IVF  Code Status: DNR  Family Communication: no family at bedside   Disposition Plan: Monitor leukocytosis, follow-up blood cultures, monitor potassium and kidney function, awaiting dispo   Consultants:  Neurosurgery  Infectious disease  Neurology  Cardiology  Procedures:  Echo on 03/30/17 mild concentric hypertrophy of left ventricle, EF 50-55%.  Grade 1 diastolic dysfunction.  Left atrium mildly dilated  Antimicrobials:  Vancomycin: 04/02/17  Cefepime: 04/02/17  Cultures:  3/6: blood cultures -NGTD  2/18: CSF (anaerobic, fungal, CSF):negative  2/28: Blood culture negative up-to-date  2/28 urine culture: E. coli (ampicillin resistant, Augmentin intermediate)  Telemetry:Yes  DVT prophylaxis:  SCDs   Objective: Vitals:   04/08/17 0553 04/08/17 0742 04/08/17 1130  04/08/17 1712  BP: 132/76  119/71 138/76  Pulse: 69 70 72 60  Resp: Marland Kitchen)  22 19    Temp: 99.2 F (37.3 C) 99.4 F (37.4 C) 99.2 F (37.3 C) 99.4 F (37.4 C)  TempSrc: Oral Oral Oral Oral  SpO2: 100% 100% 100%   Weight: (!) 141 kg (310 lb 13.6 oz)     Height:        Intake/Output Summary (Last 24 hours) at 04/08/2017 1854 Last data filed at 04/08/2017 1816 Gross per 24 hour  Intake 2990 ml  Output 6000 ml  Net -3010 ml   Filed Weights   04/06/17 0355 04/07/17 0424 04/08/17 0553  Weight: (!) 139 kg (306 lb 7 oz) (!) 140.6 kg (309 lb 15.5 oz) (!) 141 kg (310 lb 13.6 oz)    Exam:  General: Lying in bed comfortably,no apparent distress Respiratory: Normal respiratory effort on room air, lungs clear to auscultation bilaterally Abdomen: soft, non-distended, non-tender, normal bowel sounds Skin: No Rash Neurologic:Mental status AAOx3. 0/5 strength in lower extremities ( not able to move across plane of bed), 3/5 strength in proximal upper extremities, minimal strength in distal upper extremities ( almost no tone in hands, no grip strength) Psychiatric:Appropriate affect, and mood  Data Reviewed: CBC: Recent Labs  Lab 04/02/17 0403 04/03/17 0233 04/04/17 0628 04/05/17 0521 04/07/17 0520 04/08/17 0503  WBC 27.0* 19.1* 13.5* 13.7* 19.3* 16.8*  NEUTROABS 22.9*  --   --   --   --   --   HGB 11.5* 11.1* 10.6* 10.3* 10.5* 10.1*  HCT 33.3* 31.9* 31.5* 30.2* 29.9* 29.0*  MCV 87.2 88.1 88.0 87.5 85.7 84.5  PLT 216 229 233 220 216 207   Basic Metabolic Panel: Recent Labs  Lab 04/04/17 0628 04/05/17 0521 04/06/17 0500 04/07/17 0520 04/08/17 1222  NA 143 140 135 132* 134*  K 4.5 4.7 4.9 5.2* 6.0*  CL 115* 111 109 106 109  CO2 21* 20* 19* 18* 16*  GLUCOSE 279* 239* 263* 231* 189*  BUN 50* 44* 45* 45* 49*  CREATININE 1.55* 1.58* 1.56* 1.68* 1.75*  CALCIUM 8.1* 8.0* 7.9* 8.0* 8.0*  MG  --   --   --  2.3  --    GFR: Estimated Creatinine Clearance: 78.4 mL/min (A) (by C-G  formula based on SCr of 1.75 mg/dL (H)). Liver Function Tests: Recent Labs  Lab 04/02/17 0403 04/06/17 0500 04/07/17 0520  AST 18 40 27  ALT 31 139* 123*  ALKPHOS 43 48 52  BILITOT 0.8 0.7 0.7  PROT 5.7* 5.2* 5.3*  ALBUMIN 2.1* 1.8* 2.0*   No results for input(s): LIPASE, AMYLASE in the last 168 hours. No results for input(s): AMMONIA in the last 168 hours. Coagulation Profile: No results for input(s): INR, PROTIME in the last 168 hours. Cardiac Enzymes: No results for input(s): CKTOTAL, CKMB, CKMBINDEX, TROPONINI in the last 168 hours. BNP (last 3 results) No results for input(s): PROBNP in the last 8760 hours. HbA1C: No results for input(s): HGBA1C in the last 72 hours. CBG: Recent Labs  Lab 04/07/17 2040 04/07/17 2125 04/08/17 0741 04/08/17 1132 04/08/17 1709  GLUCAP 247* 247* 206* 200* 211*   Lipid Profile: No results for input(s): CHOL, HDL, LDLCALC, TRIG, CHOLHDL, LDLDIRECT in the last 72 hours. Thyroid Function Tests: No results for input(s): TSH, T4TOTAL, FREET4, T3FREE, THYROIDAB in the last 72 hours. Anemia Panel: No results for input(s): VITAMINB12, FOLATE, FERRITIN, TIBC, IRON, RETICCTPCT in the last 72 hours. Urine analysis:    Component Value Date/Time   COLORURINE AMBER (A) 04/01/2017 1218   APPEARANCEUR CLOUDY (A) 04/01/2017 1218  LABSPEC 1.017 04/01/2017 1218   PHURINE 5.0 04/01/2017 1218   GLUCOSEU NEGATIVE 04/01/2017 1218   HGBUR LARGE (A) 04/01/2017 1218   BILIRUBINUR NEGATIVE 04/01/2017 1218   KETONESUR NEGATIVE 04/01/2017 1218   PROTEINUR 30 (A) 04/01/2017 1218   NITRITE NEGATIVE 04/01/2017 1218   LEUKOCYTESUR LARGE (A) 04/01/2017 1218   Sepsis Labs: @LABRCNTIP (procalcitonin:4,lacticidven:4)  ) Recent Results (from the past 240 hour(s))  Culture, blood (routine x 2)     Status: None   Collection Time: 04/01/17 12:30 AM  Result Value Ref Range Status   Specimen Description BLOOD RIGHT ARM  Final   Special Requests IN PEDIATRIC  BOTTLE Blood Culture adequate volume  Final   Culture   Final    NO GROWTH 5 DAYS Performed at [facility], [address], [place], [place] [address]    Report Status 04/06/2017 FINAL  Final  Culture, blood (routine x 2)     Status: None   Collection Time: 04/01/17 12:40 AM  Result Value Ref Range Status   Specimen Description BLOOD LEFT HAND  Final   Special Requests IN PEDIATRIC BOTTLE Blood Culture adequate volume  Final   Culture   Final    NO GROWTH 5 DAYS Performed at [facility], [address] [place], [place] [address]    Report Status 04/06/2017 FINAL  Final  Culture, Urine     Status: Abnormal   Collection Time: 04/01/17  1:11 PM  Result Value Ref Range Status   Specimen Description URINE, RANDOM  Final   Special Requests   Final    NONE Performed at [facility], [address] [place], [place] [address]    Culture (A)  Final    >=100,000 COLONIES/mL ESCHERICHIA COLI 50,000 COLONIES/mL ENTEROBACTER SPECIES    Report Status 04/04/2017 FINAL  Final   Organism ID, Bacteria ESCHERICHIA COLI (A)  Final   Organism ID, Bacteria ENTEROBACTER SPECIES (A)  Final      Susceptibility   Escherichia coli - MIC*    AMPICILLIN >=32 RESISTANT Resistant     CEFAZOLIN 16 SENSITIVE Sensitive     CEFTRIAXONE <=1 SENSITIVE Sensitive     CIPROFLOXACIN <=0.25 SENSITIVE Sensitive     GENTAMICIN <=1 SENSITIVE Sensitive     IMIPENEM <=0.25 SENSITIVE Sensitive     NITROFURANTOIN <=16 SENSITIVE Sensitive     TRIMETH/SULFA <=20 SENSITIVE Sensitive     AMPICILLIN/SULBACTAM 16 INTERMEDIATE Intermediate     PIP/TAZO <=4 SENSITIVE Sensitive     Extended ESBL NEGATIVE Sensitive     * >=100,000 COLONIES/mL ESCHERICHIA COLI   Enterobacter species - MIC*    CEFAZOLIN >=64 RESISTANT Resistant     CEFTRIAXONE <=1 SENSITIVE Sensitive     CIPROFLOXACIN <=0.25 SENSITIVE Sensitive     GENTAMICIN <=1 SENSITIVE Sensitive     IMIPENEM 1 SENSITIVE Sensitive     NITROFURANTOIN 64  INTERMEDIATE Intermediate     TRIMETH/SULFA <=20 SENSITIVE Sensitive     PIP/TAZO <=4 SENSITIVE Sensitive     * 50,000 COLONIES/mL ENTEROBACTER SPECIES  Culture, blood (Routine X 2) w Reflex to ID Panel     Status: None (Preliminary result)   Collection Time: 04/07/17  9:34 AM  Result Value Ref Range Status   Specimen Description BLOOD LEFT HAND  Final   Special Requests   Final    BOTTLES DRAWN AEROBIC AND ANAEROBIC Blood Culture adequate volume   Culture   Final    NO GROWTH 1 DAY Performed at [facility], [address] [place], [place] [address]    Report Status PENDING  Incomplete  Culture, blood (Routine X 2) w Reflex to ID Panel     Status: None (Preliminary result)   Collection Time: 04/07/17  9:45 AM  Result Value Ref Range Status   Specimen Description BLOOD SITE NOT SPECIFIED  Final   Special Requests   Final    BOTTLES DRAWN AEROBIC AND ANAEROBIC Blood Culture adequate volume   Culture   Final    NO GROWTH 1 DAY Performed at [facility], [address] [place], [place] [address]    Report Status PENDING  Incomplete      Studies: No results found.  Scheduled Meds: . amiodarone  200 mg Oral BID  . B-complex with vitamin C  1 tablet Oral Daily  . dexamethasone  3 mg Intravenous Q8H  . diltiazem  30 mg Oral Q8H  . feeding supplement (GLUCERNA SHAKE)  237 mL Oral TID BM  . insulin aspart  0-20 Units Subcutaneous TID WC  . insulin aspart  0-5 Units Subcutaneous QHS  . insulin aspart  6 Units Subcutaneous TID WC  . insulin glargine  60 Units Subcutaneous QHS  . pantoprazole  40 mg Oral Daily  . sodium chloride flush  10-40 mL Intracatheter Q12H  . sodium chloride flush  3 mL Intravenous Q12H    Continuous Infusions: . sodium chloride 100 mL/hr at 04/08/17 0600  . ceFEPime (MAXIPIME) IV Stopped (04/08/17 1647)  . methocarbamol (ROBAXIN)  IV    . vancomycin Stopped (04/08/17 1000)     LOS: 18 days     Laverna Peace, MD [facility] Pager [phone]  If 7PM-7AM, please contact night-coverage [email or web address] Password [facility] 04/08/2017, 6:54 PM

## 2017-04-08 NOTE — Progress Notes (Addendum)
Physical Therapy Treatment Patient Details Name: Dylan [name] MRN: [id number] DOB: 1966-01-26 Today's Date: 04/08/2017    History of Present Illness Patient is a 52 y/o male who presents with 7 month hx of progressive weakness, Right>left, s/p 2 falls at home. 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 Cord compression at C5-6 spinal level. MRI are consistent with MS. s/p C5-6, 6-7 ACDF 2/21. PMH includes HTN, DM.    PT Comments    Pt participated as hard as he was able for over 30 min.  No discernable LE movements, trace to minimal truncal movement, but noticeable gross truncal flexion/extension with UE/Upper trunk involvement.  Emphasis on bed mobility, sitting for truncal activation, lifted transfer to chair, positioning in the chair for maximal comfort, safety and  Participation.   Follow Up Recommendations  SNF;Supervision/Assistance - 24 hour     Equipment Recommendations  Other (comment)    Recommendations for Other Services       Precautions / Restrictions Precautions Precautions: Fall Restrictions Weight Bearing Restrictions: No    Mobility  Bed Mobility Overal bed mobility: Needs Assistance Bed Mobility: Rolling Rolling: Max assist;+2 for physical assistance Sidelying to sit: Total assist;+2 for physical assistance   Sit to supine: Total assist;+2 for physical assistance   General bed mobility comments: Multimodal cues for rolling for pad placement  Transfers Overall transfer level: Needs assistance               General transfer comment: Total assist with use of maxi move for chair > bed  Ambulation/Gait                 Stairs            Wheelchair Mobility    Modified Rankin (Stroke Patients Only) Modified Rankin (Stroke Patients  Only) Modified Rankin: Severe disability     Balance   Sitting-balance support: Feet supported Sitting balance-Leahy Scale: Zero Sitting balance - Comments: sat 10 min working on truncal activation and reaching forward to "slap" techs hands.                                    Cognition Arousal/Alertness: Awake/alert Behavior During Therapy: WFL for tasks assessed/performed Overall Cognitive Status: Within Functional Limits for tasks assessed                                        Exercises Other Exercises Other Exercises: Encouraged pt to request OOB to chair every day with RN staff Other Exercises: Facilitated functional reach cross body engaging core with assistance from therapist.  Other Exercises: PROM to Bil LE    General Comments General comments (skin integrity, edema, etc.): vss      Pertinent Vitals/Pain Pain Assessment: Faces Faces Pain Scale: Hurts little more Pain Location: generalized Pain Descriptors / Indicators: Grimacing Pain Intervention(s): Monitored during session    Home Living                      Prior Function            PT Goals (current goals can now be found in the care plan section) Acute  Rehab PT Goals Patient Stated Goal: walk out of here PT Goal Formulation: With patient Time For Goal Achievement: 04/15/17 Potential to Achieve Goals: Fair Progress towards PT goals: Progressing toward goals    Frequency    Min 3X/week      PT Plan Current plan remains appropriate    Co-evaluation PT/OT/SLP Co-Evaluation/Treatment: Yes Reason for Co-Treatment: Complexity of the patient's impairments (multi-system involvement) PT goals addressed during session: Mobility/safety with mobility OT goals addressed during session: Strengthening/ROM      AM-PAC PT "6 Clicks" Daily Activity  Outcome Measure  Difficulty turning over in bed (including adjusting bedclothes, sheets and blankets)?:  Unable Difficulty moving from lying on back to sitting on the side of the bed? : Unable Difficulty sitting down on and standing up from a chair with arms (e.g., wheelchair, bedside commode, etc,.)?: Unable Help needed moving to and from a bed to chair (including a wheelchair)?: Total Help needed walking in hospital room?: Total Help needed climbing 3-5 steps with a railing? : Total 6 Click Score: 6    End of Session   Activity Tolerance: Patient tolerated treatment well;Patient limited by fatigue Patient left: in bed;with call bell/phone within reach;Other (comment)(worked on soft touch system) Nurse Communication: Mobility status;Need for lift equipment PT Visit Diagnosis: Muscle weakness (generalized) (M62.81);Other abnormalities of gait and mobility (R26.89)     Time: 1610-9604 PT Time Calculation (min) (ACUTE ONLY): 38 min  Charges:  $Therapeutic Exercise: 8-22 mins $Therapeutic Activity: 23-37 mins                    G Codes:       2017-04-16  [place] Bing, PT [phone]  (pager)   Dylan [name] April 16, 2017, 5:26 PM

## 2017-04-09 LAB — BASIC METABOLIC PANEL
ANION GAP: 9 (ref 5–15)
BUN: 49 mg/dL — ABNORMAL HIGH (ref 6–20)
CHLORIDE: 107 mmol/L (ref 101–111)
CO2: 18 mmol/L — AB (ref 22–32)
Calcium: 8.2 mg/dL — ABNORMAL LOW (ref 8.9–10.3)
Creatinine, Ser: 1.69 mg/dL — ABNORMAL HIGH (ref 0.61–1.24)
GFR calc non Af Amer: 46 mL/min — ABNORMAL LOW (ref >60)
GFR, EST AFRICAN AMERICAN: 53 mL/min — AB (ref >60)
Glucose, Bld: 203 mg/dL — ABNORMAL HIGH (ref 65–99)
POTASSIUM: 5.1 mmol/L (ref 3.5–5.1)
Sodium: 134 mmol/L — ABNORMAL LOW (ref 135–145)

## 2017-04-09 LAB — POTASSIUM
Potassium: 5 mmol/L (ref 3.5–5.1)
Potassium: 5 mmol/L (ref 3.5–5.1)

## 2017-04-09 LAB — CBC
HEMATOCRIT: 30.2 % — AB (ref 39.0–52.0)
HEMOGLOBIN: 10.7 g/dL — AB (ref 13.0–17.0)
MCH: 30.2 pg (ref 26.0–34.0)
MCHC: 35.4 g/dL (ref 30.0–36.0)
MCV: 85.3 fL (ref 78.0–100.0)
Platelets: 200 10*3/uL (ref 150–400)
RBC: 3.54 MIL/uL — AB (ref 4.22–5.81)
RDW: 13.2 % (ref 11.5–15.5)
WBC: 14.8 10*3/uL — AB (ref 4.0–10.5)

## 2017-04-09 LAB — GLUCOSE, CAPILLARY
Glucose-Capillary: 148 mg/dL — ABNORMAL HIGH (ref 65–99)
Glucose-Capillary: 196 mg/dL — ABNORMAL HIGH (ref 65–99)
Glucose-Capillary: 180 mg/dL — ABNORMAL HIGH (ref 65–99)

## 2017-04-09 MED ORDER — SENNOSIDES-DOCUSATE SODIUM 8.6-50 MG PO TABS: 1.0000 | ORAL_TABLET | Freq: Every evening | ORAL | Status: AC | PRN

## 2017-04-09 MED ORDER — METHOCARBAMOL 500 MG PO TABS: 500.0000 mg | ORAL_TABLET | Freq: Four times a day (QID) | ORAL | Status: AC | PRN

## 2017-04-09 MED ORDER — DEXAMETHASONE 1.5 MG PO TABS
3.0000 mg | ORAL_TABLET | Freq: Three times a day (TID) | ORAL | Status: DC
  Administered 2017-04-09: 3 mg via ORAL
  Filled 2017-04-09 (×3): qty 2

## 2017-04-09 MED ORDER — SODIUM CHLORIDE 0.9 % IV SOLN: 10.0000 mL | INTRAVENOUS | 0 refills | Status: DC

## 2017-04-09 MED ORDER — INSULIN ASPART 100 UNIT/ML ~~LOC~~ SOLN: 0.0000 [IU] | Freq: Three times a day (TID) | SUBCUTANEOUS | 11 refills | Status: DC

## 2017-04-09 MED ORDER — AMIODARONE HCL 200 MG PO TABS: 200.0000 mg | ORAL_TABLET | Freq: Two times a day (BID) | ORAL | Status: DC

## 2017-04-09 MED ORDER — VANCOMYCIN HCL 500 MG IV SOLR: 500.0000 mg | Freq: Two times a day (BID) | INTRAVENOUS | Status: DC

## 2017-04-09 MED ORDER — SODIUM CHLORIDE 0.9 % IV SOLN: 2.0000 g | Freq: Two times a day (BID) | INTRAVENOUS | Status: DC

## 2017-04-09 MED ORDER — CEFEPIME IV (FOR PTA / DISCHARGE USE ONLY): 2.0000 g | Freq: Two times a day (BID) | INTRAVENOUS | 0 refills | Status: AC

## 2017-04-09 MED ORDER — INSULIN ASPART 100 UNIT/ML ~~LOC~~ SOLN: 0.0000 [IU] | Freq: Every day | SUBCUTANEOUS | 11 refills | Status: DC

## 2017-04-09 MED ORDER — BISACODYL 5 MG PO TBEC: 5.0000 mg | DELAYED_RELEASE_TABLET | Freq: Every day | ORAL | 0 refills | Status: AC | PRN

## 2017-04-09 MED ORDER — PANTOPRAZOLE SODIUM 40 MG PO TBEC: 40.0000 mg | DELAYED_RELEASE_TABLET | Freq: Every day | ORAL | Status: AC

## 2017-04-09 MED ORDER — INSULIN GLARGINE 100 UNIT/ML ~~LOC~~ SOLN: 60.0000 [IU] | Freq: Every day | SUBCUTANEOUS | 11 refills | Status: DC

## 2017-04-09 MED ORDER — AMIODARONE HCL 200 MG PO TABS: 200.0000 mg | ORAL_TABLET | Freq: Every day | ORAL | Status: AC

## 2017-04-09 MED ORDER — INSULIN ASPART 100 UNIT/ML ~~LOC~~ SOLN: 6.0000 [IU] | Freq: Three times a day (TID) | SUBCUTANEOUS | 11 refills | Status: DC

## 2017-04-09 MED ORDER — TRAZODONE HCL 50 MG PO TABS: 50.0000 mg | ORAL_TABLET | Freq: Every evening | ORAL | Status: DC | PRN

## 2017-04-09 MED ORDER — ACETAMINOPHEN 325 MG PO TABS: 650.0000 mg | ORAL_TABLET | Freq: Four times a day (QID) | ORAL | Status: DC | PRN

## 2017-04-09 MED ORDER — VANCOMYCIN IV (FOR PTA / DISCHARGE USE ONLY): 500.0000 mg | Freq: Two times a day (BID) | INTRAVENOUS | 0 refills | Status: AC

## 2017-04-09 MED ORDER — DEXAMETHASONE 1.5 MG PO TABS: 3.0000 mg | ORAL_TABLET | Freq: Three times a day (TID) | ORAL | Status: AC

## 2017-04-09 MED ORDER — GLUCERNA SHAKE PO LIQD: 237.0000 mL | Freq: Three times a day (TID) | ORAL | 0 refills | Status: DC

## 2017-04-09 MED ORDER — AMIODARONE HCL 200 MG PO TABS: 200.0000 mg | ORAL_TABLET | Freq: Every day | ORAL | Status: DC

## 2017-04-09 MED ORDER — DILTIAZEM HCL 30 MG PO TABS: 30.0000 mg | ORAL_TABLET | Freq: Three times a day (TID) | ORAL | Status: AC

## 2017-04-09 MED ORDER — ONDANSETRON HCL 4 MG PO TABS: 4.0000 mg | ORAL_TABLET | Freq: Four times a day (QID) | ORAL | 0 refills | Status: AC | PRN

## 2017-04-09 NOTE — Discharge Summary (Signed)
DISCHARGE SUMMARY  ANDYN SALES  MR#: [id number]  DOB:[birth year]  Date of Admission: 03/21/2017 Date of Discharge: 04/09/2017  Attending Physician:Jaysha Lasure Hennie Duos, MD   Patient's LOV:FIEPPIR, No Pcp Per  Consults:  Neurosurgery  Infectious disease  Neurology  Cardiology  Disposition: D/C to SNF   Follow-up Appts:  Contact information for follow-up providers    [facility] Follow up on 04/13/2017.   Why:  Your appointment times is at 2 pm. Please arrive 15 min early and bring: picture ID and current medications Contact information: [place] [phone] [place] Follow up.   Why:  please use this location for your pharmacy needs.  Contact information: [place] [address] [phone]       Consuella Lose, MD Follow up in 2 week(s).   Specialty:  Neurosurgery Contact information: [address] [address] [phone]        Campbell Riches, MD Follow up in 4 week(s).   Specialty:  Infectious Diseases Contact information: [place] [address] [phone]            Contact information for after-discharge care    Destination    [facility] Follow up.   Service:  Skilled Nursing Contact information: [address] [address] [phone]                  Tests Needing Follow-up: -monitor renal function  -see orders bellow as per ID Team   Discharge Diagnoses: SIRS v/s Sepsis  Cervical cord compression Multiple sclerosis - new diagnosis on admission Atrial fibrillation Type 2 diabetes AKI, c/b hyperkalemia and Non anion gap metabolic acidosis NO CODE BLUE - DNR   Initial presentation: 52 y.o. 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    Hospital Course: Treatment included IV steroids and spinal decompression on 2/21 by Neurosurgery.  Hospital course was complicated by new onset atrial fibrillation with RVR requiring initiation of amiodarone drip on 2/27 which has since been transitioned to oral amiodarone and diltiazem.  Anticoagulation was discontinued due to concern for possible hematoma of the spinal cord found on MRI of the C spine on 03/25/17.  It showed fluid collection with resultant recurrent cord compression.  Neurosurgery re-evaluated the patient but felt he was not a candidate for evacuation of hematoma given the poor chance of meaningful recovery and the high risk (recurrent hematoma, airway compromise, high risk of mortality/morbidity) associated with his complicated procedure.  -SIRS v/s Sepsis  unclear source - high suspicion related to fluid collection with spinal cord versus complicated UTI - clinically stable - Afebrile, improved leukocytosis in past 24 hours -Repeat blood cultures (drawn 04/07/17) remain negative x2, as do cultures from 04/01/17 -Continue empiric vancomycin and cefepime - plan for end date 05/16/17  -Urine culture + >100K E coli + 50K Enterobacter Both should be effectively eliminated w/ current Cefepime   -Cervical cord compression w/ postop cervical abscess v/s hematoma  Related to severe spinal stenosis and MS Status post corpectomy/decompressive surgery on 03/25/17, not a repeat surgical candidate Complicated by fluid collection (epidural hematoma or abscess) found on repeat MRI on 2/28  Neurologic exam with complete lower extremity weakness greater than upper bilateral extremities -Plan for skilled nursing facility to continue IV antibiotics and physical rehabilitation -continue slow decadron taper based on clinical  status   Orders as per ID MD are as follows: Discharge antibiotics:vanco, cefepime Per pharmacy protocol: vanco Aim for Vancomycin trough 15-20 (unless otherwise indicated) Duration: 42 days End Date: May 16, 2017  [facility] Per Protocol:  Labs weekly while on IV antibiotics: _x_ CBC with differential __ BMP x__ CMP _x_ CRP _x_ ESR _x_ Vancomycin trough  _x_ Please pull PIC at completion of IV antibiotics __ Please leave PIC in place until doctor has seen patient or been notified  Fax weekly labs to [phone] Clinic Follow Up Appt: [place] 4-6 weeks   -Multiple sclerosis, new diagnosis on admission Was tx w/ a course of IV steroids during initial portion of hospital stay   -Parox Atrial fibrillation continue amiodarone and diltiazem - in NSR at time of d/c - not an candidate for anticoagulation due to possible epidural hematoma   -Type 2 diabetes  -AKI, c/b hyperkalemia and Non anion gap metabolic acidosis Previous baseline 0.8-1 > currently 1.69 - slowly improving  -monitor BMP  -NO CODE BLUE - DNR    Allergies as of 04/09/2017   No Known Allergies     Medication List    STOP taking these medications   lisinopril 10 MG tablet Commonly known as:  PRINIVIL,ZESTRIL   metFORMIN 500 MG tablet Commonly known as:  GLUCOPHAGE     TAKE these medications   acetaminophen 325 MG tablet Commonly known as:  TYLENOL Take 2 tablets (650 mg total) by mouth every 6 (six) hours as needed for mild pain (or Fever >/= 101).   amiodarone 200 MG tablet Commonly known as:  PACERONE Take 1 tablet (200 mg total) by mouth daily. Start taking on:  04/10/2017   b complex vitamins tablet Take 1 tablet by mouth daily.   bisacodyl 5 MG EC tablet Commonly known as:  DULCOLAX Take 1 tablet (5 mg total) by mouth daily as needed for moderate constipation.   ceFEPIme 2 g in sodium chloride 0.9 % 100 mL Inject 2 g into the vein every 12 (twelve) hours. Start taking on:   04/10/2017   ceFEPime IVPB Commonly known as:  MAXIPIME Inject 2 g into the vein every 12 (twelve) hours. Indication:  Spinal abscess Last Day of Therapy:  05/16/2017 Labs - Once weekly:  CBC/D and BMP, Labs - Every other week:  ESR and CRP   dexamethasone 1.5 MG tablet Commonly known as:  DECADRON Take 2 tablets (3 mg total) by mouth every 8 (eight) hours.   diltiazem 30 MG tablet Commonly known as:  CARDIZEM Take 1 tablet (30 mg total) by mouth every 8 (eight) hours.   feeding supplement (GLUCERNA SHAKE) Liqd Take 237 mLs by mouth 3 (three) times daily between meals.   insulin aspart 100 UNIT/ML injection Commonly known as:  novoLOG Inject 0-20 Units into the skin 3 (three) times daily with meals.   insulin aspart 100 UNIT/ML injection Commonly known as:  novoLOG Inject 0-5 Units into the skin at bedtime.   insulin aspart 100 UNIT/ML injection Commonly known as:  novoLOG Inject 6 Units into the skin 3 (three) times daily with meals.   insulin glargine 100 UNIT/ML injection Commonly known as:  LANTUS Inject 0.6 mLs (60 Units total) into the skin at bedtime.   methocarbamol 500 MG tablet Commonly known as:  ROBAXIN Take 1 tablet (500 mg total) by mouth every 6 (six) hours as needed for muscle spasms.   ondansetron 4 MG tablet Commonly known as:  ZOFRAN Take 1 tablet (4 mg total) by mouth every 6 (six) hours as needed for nausea.   pantoprazole 40 MG tablet Commonly known as:  PROTONIX Take 1 tablet (40 mg total) by mouth daily. Start taking on:  04/10/2017   senna-docusate 8.6-50 MG tablet Commonly known as:  Senokot-S Take 1 tablet by mouth at bedtime as needed for mild constipation.   sodium chloride 0.9 % infusion Inject 10 mLs into the vein continuous.   traZODone 50 MG tablet Commonly known as:  DESYREL Take 1 tablet (50 mg total) by mouth at bedtime as needed for sleep.   vancomycin 500 mg in sodium chloride 0.9 % 100 mL Inject 500 mg into the vein  every 12 (twelve) hours.   vancomycin IVPB Inject 500 mg into the vein every 12 (twelve) hours. Indication:  Spinal abscess Last Day of Therapy:  05/16/2017 Labs - _0 /08/19 1533      Day of Discharge BP 130/69 (BP Location: Left Arm)   Pulse 66   Temp 98.4 F (36.9 C) (Oral)   Resp 19   Ht _1  (1.93 m)   Wt (!) 143 kg (315 lb 4.1 oz)   SpO2 100%   BMI 38.37 kg/m   Physical Exam: General: No acute respiratory distress Lungs: Clear to auscultation bilaterally without wheezes or crackles Cardiovascular: Regular rate and rhythm without murmur gallop or rub normal S1 and S2 Abdomen: Nontender, nondistended, soft, bowel sounds positive, no  rebound, no ascites, no appreciable mass Extremities: No significant cyanosis, clubbing, or edema bilateral lower extremities  Basic Metabolic Panel: Recent Labs  Lab 04/05/17 0521 04/06/17 0500 04/07/17 0520 04/08/17 1222 04/08/17 1832 04/08/17 2100 04/09/17 0039 04/09/17 0130 04/09/17 1049  NA 140 135 132* 134*  --   --   --   --  134*  K 4.7 4.9 5.2* 6.0* 5.1 5.2* 5.0 5.0 5.1  CL 111 109 106 109  --   --   --   --  107  CO2 20*  19* 18* 16*  --   --   --   --  18*  GLUCOSE 239* 263* 231* 189*  --   --   --   --  203*  BUN 44* 45* 45* 49*  --   --   --   --  49*  CREATININE 1.58* 1.56* 1.68* 1.75*  --   --   --   --  1.69*  CALCIUM 8.0* 7.9* 8.0* 8.0*  --   --   --   --  8.2*  MG  --   --  2.3  --   --   --   --   --   --     Liver Function Tests: Recent Labs  Lab 04/06/17 0500 04/07/17 0520  AST 40 27  ALT 139* 123*  ALKPHOS 48 52  BILITOT 0.7 0.7  PROT 5.2* 5.3*  ALBUMIN 1.8* 2.0*    CBC: Recent Labs  Lab 04/04/17 0628 04/05/17 0521 04/07/17 0520 04/08/17 0503 04/09/17 1049  WBC 13.5* 13.7* 19.3* 16.8* 14.8*  HGB 10.6* 10.3* 10.5* 10.1* 10.7*  HCT 31.5* 30.2* 29.9* 29.0* 30.2*  MCV 88.0 87.5 85.7 84.5 85.3  PLT 233 220 216 207 200     CBG: Recent Labs  Lab 04/08/17 1132 04/08/17 1709 04/08/17 2033 04/09/17 0756 04/09/17 1124  GLUCAP 200* 211* 208* 148* 196*    Recent Results (from the past 240 hour(s))  Culture, blood (routine x 2)     Status: None   Collection Time: 04/01/17 12:30 AM  Result Value Ref Range Status   Specimen Description BLOOD RIGHT ARM  Final   Special Requests IN PEDIATRIC BOTTLE Blood Culture adequate volume  Final   Culture   Final    NO GROWTH 5 DAYS Performed at [facility], [address], [place], [place] [address]    Report Status 04/06/2017 FINAL  Final  Culture, blood (routine x 2)     Status: None   Collection Time: 04/01/17 12:40 AM  Result Value Ref Range Status   Specimen Description BLOOD LEFT  HAND  Final   Special Requests IN PEDIATRIC BOTTLE Blood Culture adequate volume  Final   Culture   Final    NO GROWTH 5 DAYS Performed at [facility], [place] [address], [place], [place] [address]    Report Status 04/06/2017 FINAL  Final  Culture, Urine     Status: Abnormal   Collection Time: 04/01/17  1:11 PM  Result Value Ref Range Status   Specimen Description URINE, RANDOM  Final   Special Requests   Final    NONE Performed at [facility], [place] [address] [place], [place] [address]    Culture (A)  Final    >=100,000 COLONIES/mL ESCHERICHIA COLI 50,000 COLONIES/mL ENTEROBACTER SPECIES    Report Status 04/04/2017 FINAL  Final   Organism ID, Bacteria ESCHERICHIA COLI (A)  Final   Organism ID, Bacteria ENTEROBACTER SPECIES (A)  Final      Susceptibility   Escherichia coli - MIC*    AMPICILLIN >=32 RESISTANT Resistant     CEFAZOLIN 16 SENSITIVE Sensitive     CEFTRIAXONE <=1 SENSITIVE Sensitive     CIPROFLOXACIN <=0.25 SENSITIVE Sensitive     GENTAMICIN <=1 SENSITIVE Sensitive     IMIPENEM <=0.25 SENSITIVE Sensitive     NITROFURANTOIN <=16 SENSITIVE Sensitive     TRIMETH/SULFA <=20 SENSITIVE Sensitive     AMPICILLIN/SULBACTAM 16 INTERMEDIATE Intermediate  PIP/TAZO <=4 SENSITIVE Sensitive     Extended ESBL NEGATIVE Sensitive     * >=100,000 COLONIES/mL ESCHERICHIA COLI   Enterobacter species - MIC*    CEFAZOLIN >=64 RESISTANT Resistant     CEFTRIAXONE <=1 SENSITIVE Sensitive     CIPROFLOXACIN <=0.25 SENSITIVE Sensitive     GENTAMICIN <=1 SENSITIVE Sensitive     IMIPENEM 1 SENSITIVE Sensitive     NITROFURANTOIN 64 INTERMEDIATE Intermediate     TRIMETH/SULFA <=20 SENSITIVE Sensitive     PIP/TAZO <=4 SENSITIVE Sensitive     * 50,000 COLONIES/mL ENTEROBACTER SPECIES  Culture, blood (Routine X 2) w Reflex to ID Panel     Status: None (Preliminary result)   Collection Time: 04/07/17  9:34 AM  Result Value Ref Range Status   Specimen Description BLOOD LEFT HAND   Final   Special Requests   Final    BOTTLES DRAWN AEROBIC AND ANAEROBIC Blood Culture adequate volume   Culture   Final    NO GROWTH 2 DAYS Performed at [facility], [address], [place], [place] [address]    Report Status PENDING  Incomplete  Culture, blood (Routine X 2) w Reflex to ID Panel     Status: None (Preliminary result)   Collection Time: 04/07/17  9:45 AM  Result Value Ref Range Status   Specimen Description BLOOD SITE NOT SPECIFIED  Final   Special Requests   Final    BOTTLES DRAWN AEROBIC AND ANAEROBIC Blood Culture adequate volume   Culture   Final    NO GROWTH 2 DAYS Performed at [facility], [address] [place], [place] [address]    Report Status PENDING  Incomplete     Time spent in discharge (includes decision making & examination of pt): 35 minutes  04/09/2017, 3:37 PM   Cherene Altes, MD [facility] Office  [phone] Pager [phone]  On-Call/Text Page:      [email or web address]      password [facility]

## 2017-04-09 NOTE — Progress Notes (Signed)
Chaplain visited with Pt.  As a  follow up to previoud spiritual care. Pt. Was in good spirits when I entered the room and was having lunch. Pt. Was excited to see Chaplain as a follow up. Provided empathetic listening, social support and presence. Will follow as needed.

## 2017-04-09 NOTE — Progress Notes (Signed)
   04/09/17 0900  Clinical Encounter Type  Visited With Patient  Visit Type Follow-up  Referral From Nurse  Consult/Referral To Chaplain  Spiritual Encounters  Spiritual Needs Prayer;Emotional  Stress Factors  Patient Stress Factors Exhausted  Family Stress Factors Exhausted    I visited with this pt this morning. Patient was getting ready to have his liquid food . No family on-site. Patient was very receptive and appreciative of chaplain's visit but also very tearful and distressed about his medical condition. I provided emotional support through reflective listening, compassionate presence and prayer. Pt needs more fallow-up and I referred him to the day-time spiritual care provider,(chaplain [place])  Casidee Jann a [place], Chaplain   Rosibel Giacobbe a [place], [address]

## 2017-04-09 NOTE — Progress Notes (Signed)
CSW confirmed patient's bed available at [facility] ([facility] [place] [facility] [place]). 30 day LOG has been approved for patient's rehab at [facility] and [facility] will accept LOG. Facility ready to take patient if medically ready today. Paged MD. CSW to support with discharge.  Abigail Butts, LCSWA [phone]

## 2017-04-09 NOTE — Clinical Social Work Placement (Signed)
   CLINICAL SOCIAL WORK PLACEMENT  NOTE  Date:  04/09/2017  Patient Details  Name: Dylan [name] MRN: [id number] Date of Birth: [birth year]  Clinical Social Work is seeking post-discharge placement for this patient at the Skilled  Nursing Facility level of care (*CSW will initial, date and re-position this form in  chart as items are completed):  Yes   Patient/family provided with [facility] Clinical Social Work Department's list of facilities offering this level of care within the geographic area requested by the patient (or if unable, by the patient's family).  Yes   Patient/family informed of their freedom to choose among providers that offer the needed level of care, that participate in Medicare, Medicaid or managed care program needed by the patient, have an available bed and are willing to accept the patient.  Yes   Patient/family informed of [facility]'s ownership interest in [facility] and [facility], as well as of the fact that they are under no obligation to receive care at these facilities.  PASRR submitted to EDS on 03/27/17     PASRR number received on 03/27/17     Existing PASRR number confirmed on       FL2 transmitted to all facilities in geographic area requested by pt/family on 03/30/17     FL2 transmitted to all facilities within larger geographic area on       Patient informed that his/her managed care company has contracts with or will negotiate with certain facilities, including the following:  [facility]([place] [facility] [place])     Yes   Patient/family informed of bed offers received.  Patient chooses bed at [facility]([place] [facility] [place])     Physician recommends and patient chooses bed at      Patient to be transferred to [facility]([place] [facility] [place]) on 04/09/17.  Patient to be transferred to facility by [facility]     Patient family notified on 04/09/17 of  transfer.  Name of family member notified:  [name]     PHYSICIAN Please prepare priority discharge summary, including medications, Please prepare prescriptions, Please sign DNR     Additional Comment:    _______________________________________________ Abigail Butts, LCSW 04/09/2017, 3:39 PM

## 2017-04-09 NOTE — Discharge Instructions (Signed)
Multiple Sclerosis Multiple sclerosis (MS) is a disease of the central nervous system. It leads to the loss of the insulating covering of the nerves (myelin sheath) of your brain. When this happens, brain signals do not get sent properly or may not get sent at all. The age of onset of MS varies. What are the causes? The cause of MS is unknown. However, it is more common in the [place] than in the [place]. What increases the risk? There is a higher number of women with MS than men. MS is not an illness that is passed down to you from your family members (inherited). However, your risk of MS is higher if you have a relative with MS. What are the signs or symptoms? The symptoms of MS occur in episodes or attacks. These attacks may last weeks to months. There may be long periods of almost no symptoms between attacks. The symptoms of MS vary. This is because of the many different ways it affects the central nervous system. The main symptoms of MS include:  Vision problems and eye pain.  Numbness.  Weakness.  Inability to move your arms, hands, feet, or legs (paralysis).  Balance problems.  Tremors.  How is this diagnosed? Your health care provider can diagnose MS with the help of imaging exams and lab tests. These may include specialized X-ray exams and spinal fluid tests. The best imaging exam to confirm a diagnosis of MS is an MRI. How is this treated? There is no known cure for MS, but there are medicines that can decrease the number and frequency of attacks. Steroids are often used for short-term relief. Physical and occupational therapy may also help. There are also many new alternative or complementary treatments available to help control the symptoms of MS. Ask your health care provider if any of these other options are right for you. Follow these instructions at home:  Take medicines as directed by your health care provider.  Exercise as directed by your  health care provider. Contact a health care provider if: You begin to feel depressed. Get help right away if:  You develop paralysis.  You have problems with bladder, bowel, or sexual function.  You develop mental changes, such as forgetfulness or mood swings.  You have a period of uncontrolled movements (seizure). This information is not intended to replace advice given to you by your health care provider. Make sure you discuss any questions you have with your health care provider. Document Released: 01/17/2000 Document Revised: 06/27/2015 Document Reviewed: 09/26/2012 Elsevier Interactive Patient Education  2017 [employer]   Spinal Stenosis Spinal stenosis occurs when the open space (spinal canal) between the bones of your spine (vertebrae) narrows, putting pressure on the spinal cord or nerves. What are the causes? This condition is caused by areas of bone pushing into the central canals of your vertebrae. This condition may be present at birth (congenital), or it may be caused by:  Arthritic deterioration of your vertebrae (spinal degeneration). This usually starts around age 8.  Injury or trauma to the spine.  Tumors in the spine.  Calcium deposits in the spine.  What are the signs or symptoms? Symptoms of this condition include:  Pain in the neck or back that is generally worse with activities, particularly when standing and walking.  Numbness, tingling, hot or cold sensations, weakness, or weariness in your legs.  Pain going up and down the leg (sciatica).  Frequent episodes of falling.  A foot-slapping gait  that leads to muscle weakness.  In more serious cases, you may develop:  Problemspassing stool or passing urine.  Difficulty having sex.  Loss of feeling in part or all of your leg.  Symptoms may come on slowly and get worse over time. How is this diagnosed? This condition is diagnosed based on your medical history and a physical exam. Tests will  also be done, such as:  MRI.  CT scan.  X-ray.  How is this treated? Treatment for this condition often focuses on managing your pain and any other symptoms. Treatment may include:  Practicing good posture to lessen pressure on your nerves.  Exercising to strengthen muscles, build endurance, improve balance, and maintain good joint movement (range of motion).  Losing weight, if needed.  Taking medicines to reduce swelling, inflammation, or pain.  Assistive devices, such as a corset or brace.  In some cases, surgery may be needed. The most common procedure is decompression laminectomy. This is done to remove excess bone that puts pressure on your nerve roots. Follow these instructions at home: Managing pain, stiffness, and swelling  Do all exercises and stretches as told by your health care provider.  Practice good posture. If you were given a brace or a corset, wear it as told by your health care provider.  Do not do any activities that cause pain. Ask your health care provider what activities are safe for you.  Do not lift anything that is heavier than 10 lb (4.5 kg) or the limit that your health care provider tells you.  Maintain a healthy weight. Talk with your health care provider if you need help losing weight.  If directed, apply heat to the affected area as often as told by your health care provider. Use the heat source that your health care provider recommends, such as a moist heat pack or a heating pad. ? Place a towel between your skin and the heat source. ? Leave the heat on for 20-30 minutes. ? Remove the heat if your skin turns bright red. This is especially important if you are not able to feel pain, heat, or cold. You may have a greater risk of getting burned. General instructions  Take over-the-counter and prescription medicines only as told by your health care provider.  Do not use any products that contain nicotine or tobacco, such as cigarettes and  e-cigarettes. If you need help quitting, ask your health care provider.  Eat a healthy diet. This includes plenty of fruits and vegetables, whole grains, and low-fat (lean) protein.  Keep all follow-up visits as told by your health care provider. This is important. Contact a health care provider if:  Your symptoms do not get better or they get worse.  You have a fever. Get help right away if:  You have new or worse pain in your neck or upper back.  You have severe pain that cannot be controlled with medicines.  You are dizzy.  You have vision problems, blurred vision, or double vision.  You have a severe headache that is worse when you stand.  You have nausea or you vomit.  You develop new or worse numbness or tingling in your back or legs.  You have pain, redness, swelling, or warmth in your arm or leg. Summary  Spinal stenosis occurs when the open space (spinal canal) between the bones of your spine (vertebrae) narrows. This narrowing puts pressure on the spinal cord or nerves.  Spinal stenosis can cause numbness, weakness, or pain in  the neck, back, and legs.  This condition may be caused by a birth defect, arthritic deterioration of your vertebrae, injury, tumors, or calcium deposits.  This condition is usually diagnosed with MRIs, CT scans, and X-rays. This information is not intended to replace advice given to you by your health care provider. Make sure you discuss any questions you have with your health care provider. Document Released: 04/11/2003 Document Revised: 12/25/2015 Document Reviewed: 12/25/2015 Elsevier Interactive Patient Education  [employer].

## 2017-04-09 NOTE — NC FL2 (Signed)
[place] MEDICAID FL2 LEVEL OF CARE SCREENING TOOL     IDENTIFICATION  Patient Name: Dylan [name] Birthdate: 07-06-1965 Sex: male Admission Date (Current Location): 03/21/2017  [facility] and [place] Number:  Producer, television/film/video and Address:  [facility], [address] [place], [place] [address]      Provider Number: [id number]  Attending Physician Name and Address:  Lonia Blood, MD  Relative Name and Phone Number:  [name], cousin, [phone]    Current Level of Care: Hospital Recommended Level of Care: Skilled Nursing Facility Prior Approval Number:    Date Approved/Denied:   PASRR Number: [id number]  Discharge Plan: SNF    Current Diagnoses: Patient Active Problem List   Diagnosis Date Noted  . Hyperkalemia 04/08/2017  . AKI (acute kidney injury) (HCC) 04/08/2017  . Palliative care encounter   . Pressure injury of skin 04/03/2017  . Multiple sclerosis exacerbation (HCC) 03/30/2017  . New onset a-fib (HCC)   . Persistent atrial fibrillation with rapid ventricular response (HCC) 03/27/2017  . Paroxysmal atrial fibrillation (HCC)   . Spinal stenosis, multiple sites in spine 03/23/2017  . Morbid obesity (HCC) 03/22/2017  . Uncontrolled diabetes mellitus type 2 without complications (HCC) 03/21/2017  . Hypertension 03/21/2017  . Bone lesion 03/21/2017    Orientation RESPIRATION BLADDER Height & Weight     Self, Time, Situation, Place  Normal Incontinent, External catheter Weight: (!) 315 lb 4.1 oz (143 kg) Height:  6\' 4"  (193 cm)  BEHAVIORAL SYMPTOMS/MOOD NEUROLOGICAL BOWEL NUTRITION STATUS      Incontinent Diet(please see DC summary)  AMBULATORY STATUS COMMUNICATION OF NEEDS Skin   Total Care Verbally PU Stage and Appropriate Care, Surgical wounds(PU stage II buttocks; closed incision lower back, adhesive bandage; closed incision neck, liquid skin adhesive)                       Personal Care Assistance  Level of Assistance  Bathing, Feeding, Dressing Bathing Assistance: Maximum assistance Feeding assistance: Maximum assistance Dressing Assistance: Maximum assistance     Functional Limitations Info  Sight, Hearing, Speech Sight Info: Adequate Hearing Info: Adequate Speech Info: Adequate    SPECIAL CARE FACTORS FREQUENCY  PT (By licensed PT), OT (By licensed OT)     PT Frequency: 5x/week OT Frequency: 5x/week            Contractures Contractures Info: Not present    Additional Factors Info  Code Status, Allergies, Insulin Sliding Scale Code Status Info: DNR Allergies Info: No Known Allergies   Insulin Sliding Scale Info: insulin 3x/day with meals and at bedtime       Current Medications (04/09/2017):  This is the current hospital active medication list Current Facility-Administered Medications  Medication Dose Route Frequency Provider Last Rate Last Dose  . 0.9 %  sodium chloride infusion   Intravenous Continuous Richarda Overlie, MD 100 mL/hr at 04/08/17 0600    . acetaminophen (TYLENOL) tablet 650 mg  650 mg Oral Q6H PRN Briscoe Deutscher, MD   650 mg at 04/06/17 2147   Or  . acetaminophen (TYLENOL) suppository 650 mg  650 mg Rectal Q6H PRN Opyd, Lavone Neri, MD      . amiodarone (PACERONE) tablet 200 mg  200 mg Oral BID Othella Boyer, MD   200 mg at 04/09/17 0836  . B-complex with vitamin C tablet 1 tablet  1 tablet Oral Daily Opyd, Lavone Neri, MD   1 tablet at 04/09/17  1610  . benzonatate (TESSALON) capsule 200 mg  200 mg Oral TID PRN Maretta Bees, MD      . bisacodyl (DULCOLAX) EC tablet 5 mg  5 mg Oral Daily PRN Opyd, Lavone Neri, MD   5 mg at 03/27/17 0535  . ceFEPIme (MAXIPIME) 2 g in sodium chloride 0.9 % 100 mL IVPB  2 g Intravenous Q12H Scarlett Presto, [facility]   Stopped at 04/09/17 0243  . dexamethasone (DECADRON) injection 3 mg  3 mg Intravenous Q8H Ghimire, Werner Lean, MD   3 mg at 04/09/17 0616  . diltiazem (CARDIZEM) tablet 30 mg  30 mg Oral Q8H Barrett, Rhonda  G, PA-C   30 mg at 04/08/17 1618  . feeding supplement (GLUCERNA SHAKE) (GLUCERNA SHAKE) liquid 237 mL  237 mL Oral TID BM Purohit, Shrey C, MD   237 mL at 04/08/17 2115  . insulin aspart (novoLOG) injection 0-20 Units  0-20 Units Subcutaneous TID WC Calvert Cantor, MD   3 Units at 04/09/17 0835  . insulin aspart (novoLOG) injection 0-5 Units  0-5 Units Subcutaneous QHS Briscoe Deutscher, MD   2 Units at 04/08/17 2129  . insulin aspart (novoLOG) injection 6 Units  6 Units Subcutaneous TID WC Maretta Bees, MD   6 Units at 04/09/17 [phone]  . insulin glargine (LANTUS) injection 60 Units  60 Units Subcutaneous QHS Delaine Lame, MD   60 Units at 04/08/17 2130  . menthol-cetylpyridinium (CEPACOL) lozenge 3 mg  1 lozenge Oral PRN Lisbeth Renshaw, MD       Or  . phenol (CHLORASEPTIC) mouth spray 1 spray  1 spray Mouth/Throat PRN Lisbeth Renshaw, MD      . methocarbamol (ROBAXIN) tablet 500 mg  500 mg Oral Q6H PRN Lisbeth Renshaw, MD   500 mg at 03/29/17 2120   Or  . methocarbamol (ROBAXIN) 500 mg in dextrose 5 % 50 mL IVPB  500 mg Intravenous Q6H PRN Lisbeth Renshaw, MD      . metoprolol tartrate (LOPRESSOR) injection 5 mg  5 mg Intravenous Q8H PRN Blount, Andi Devon T, NP   5 mg at 04/02/17 1307  . ondansetron (ZOFRAN) tablet 4 mg  4 mg Oral Q6H PRN Opyd, Lavone Neri, MD       Or  . ondansetron (ZOFRAN) injection 4 mg  4 mg Intravenous Q6H PRN Opyd, Lavone Neri, MD      . pantoprazole (PROTONIX) EC tablet 40 mg  40 mg Oral Daily Hollice Espy, MD   40 mg at 04/09/17 0836  . senna-docusate (Senokot-S) tablet 1 tablet  1 tablet Oral QHS PRN Opyd, Lavone Neri, MD      . sodium chloride flush (NS) 0.9 % injection 10-40 mL  10-40 mL Intracatheter Q12H Ghimire, Werner Lean, MD   10 mL at 04/08/17 2132  . sodium chloride flush (NS) 0.9 % injection 10-40 mL  10-40 mL Intracatheter PRN Ghimire, Werner Lean, MD      . sodium chloride flush (NS) 0.9 % injection 3 mL  3 mL Intravenous Q12H Opyd, Lavone Neri,  MD   3 mL at 04/08/17 2131  . traZODone (DESYREL) tablet 50 mg  50 mg Oral QHS PRN Roberto Scales D, MD   50 mg at 04/07/17 2235  . vancomycin (VANCOCIN) 500 mg in sodium chloride 0.9 % 100 mL IVPB  500 mg Intravenous Q12H Roberto Scales D, MD 100 mL/hr at 04/09/17 0837 500 mg at 04/09/17 5409     Discharge Medications: Please  see discharge summary for a list of discharge medications.  Relevant Imaging Results:  Relevant Lab Results:   Additional Information SSN: [id number]  Abigail Butts, LCSW

## 2017-04-09 NOTE — Progress Notes (Signed)
Patient will discharge to [facility] at [place]) Anticipated discharge date: 04/09/17 Family notified: [name], cousin Transportation by: PTAR   CSW signing off.  Abigail Butts, LCSWA  Clinical Social Worker

## 2017-04-09 NOTE — Plan of Care (Signed)
Pt repositioned throughout shift. Bony prominences supported with pillows. No new signs of skin breakdown or other injuries this shift. Changed pt's dressings. Scant serosanguinous drainage present. Pt denies pain, afebrile throughout shift.

## 2017-04-11 ENCOUNTER — Encounter (HOSPITAL_COMMUNITY): Payer: Self-pay

## 2017-04-11 ENCOUNTER — Observation Stay (HOSPITAL_COMMUNITY)
Admission: EM | Admit: 2017-04-11 | Discharge: 2017-04-13 | Disposition: A | Discharge status: Home / Self Care | Payer: Medicaid Other | Attending: Emergency Medicine | Admitting: Emergency Medicine

## 2017-04-11 DIAGNOSIS — E875 Hyperkalemia: Secondary | ICD-10-CM | POA: Diagnosis not present

## 2017-04-11 DIAGNOSIS — I10 Essential (primary) hypertension: Secondary | ICD-10-CM | POA: Insufficient documentation

## 2017-04-11 DIAGNOSIS — Y939 Activity, unspecified: Secondary | ICD-10-CM | POA: Diagnosis not present

## 2017-04-11 DIAGNOSIS — S1083XA Contusion of other specified part of neck, initial encounter: Secondary | ICD-10-CM | POA: Diagnosis not present

## 2017-04-11 DIAGNOSIS — L7632 Postprocedural hematoma of skin and subcutaneous tissue following other procedure: Secondary | ICD-10-CM | POA: Diagnosis not present

## 2017-04-11 DIAGNOSIS — Y999 Unspecified external cause status: Secondary | ICD-10-CM | POA: Insufficient documentation

## 2017-04-11 DIAGNOSIS — E119 Type 2 diabetes mellitus without complications: Secondary | ICD-10-CM | POA: Insufficient documentation

## 2017-04-11 DIAGNOSIS — Y929 Unspecified place or not applicable: Secondary | ICD-10-CM | POA: Insufficient documentation

## 2017-04-11 DIAGNOSIS — T148XXA Other injury of unspecified body region, initial encounter: Secondary | ICD-10-CM | POA: Diagnosis present

## 2017-04-11 DIAGNOSIS — X58XXXA Exposure to other specified factors, initial encounter: Secondary | ICD-10-CM | POA: Insufficient documentation

## 2017-04-11 DIAGNOSIS — S199XXA Unspecified injury of neck, initial encounter: Secondary | ICD-10-CM | POA: Diagnosis present

## 2017-04-11 HISTORY — DX: Congenital mitral stenosis: Q23.2

## 2017-04-11 LAB — COMPREHENSIVE METABOLIC PANEL
ALBUMIN: 2.2 g/dL — AB (ref 3.5–5.0)
ALK PHOS: 60 U/L (ref 38–126)
ALT: 68 U/L — AB (ref 17–63)
ANION GAP: 9 (ref 5–15)
AST: 24 U/L (ref 15–41)
BILIRUBIN TOTAL: 0.7 mg/dL (ref 0.3–1.2)
BUN: 44 mg/dL — ABNORMAL HIGH (ref 6–20)
CO2: 19 mmol/L — AB (ref 22–32)
CREATININE: 1.68 mg/dL — AB (ref 0.61–1.24)
Calcium: 8.2 mg/dL — ABNORMAL LOW (ref 8.9–10.3)
Chloride: 107 mmol/L (ref 101–111)
GFR calc non Af Amer: 46 mL/min — ABNORMAL LOW (ref >60)
GFR, EST AFRICAN AMERICAN: 54 mL/min — AB (ref >60)
GLUCOSE: 307 mg/dL — AB (ref 65–99)
Potassium: 5.4 mmol/L — ABNORMAL HIGH (ref 3.5–5.1)
SODIUM: 135 mmol/L (ref 135–145)
TOTAL PROTEIN: 5.6 g/dL — AB (ref 6.5–8.1)

## 2017-04-11 LAB — CBC WITH DIFFERENTIAL/PLATELET
Basophils Absolute: 0 10*3/uL (ref 0.0–0.1)
Basophils Relative: 0 %
Eosinophils Absolute: 0 10*3/uL (ref 0.0–0.7)
Eosinophils Relative: 0 %
HEMATOCRIT: 30.9 % — AB (ref 39.0–52.0)
Hemoglobin: 10.9 g/dL — ABNORMAL LOW (ref 13.0–17.0)
LYMPHS ABS: 1.2 10*3/uL (ref 0.7–4.0)
LYMPHS PCT: 6 %
MCH: 30.4 pg (ref 26.0–34.0)
MCHC: 35.3 g/dL (ref 30.0–36.0)
MCV: 86.1 fL (ref 78.0–100.0)
MONOS PCT: 2 %
Monocytes Absolute: 0.3 10*3/uL (ref 0.1–1.0)
NEUTROS ABS: 16.5 10*3/uL — AB (ref 1.7–7.7)
Neutrophils Relative %: 92 %
Platelets: 183 10*3/uL (ref 150–400)
RBC: 3.59 MIL/uL — AB (ref 4.22–5.81)
RDW: 13.6 % (ref 11.5–15.5)
WBC: 18 10*3/uL — ABNORMAL HIGH (ref 4.0–10.5)

## 2017-04-11 LAB — APTT: aPTT: 29 seconds (ref 24–36)

## 2017-04-11 LAB — PROTIME-INR
INR: 1.37
Prothrombin Time: 16.7 seconds — ABNORMAL HIGH (ref 11.4–15.2)

## 2017-04-11 NOTE — ED Notes (Signed)
ED Provider at bedside. 

## 2017-04-11 NOTE — ED Triage Notes (Signed)
Pt arrives via EMS from [facility] with surgical wound to right neck; Pt unaware of reason for surgical site; EMS reports site is dark and swollen; Pt states surgery was 2-3 weeks ago; EMS reports they was called due to bleeding but bleeding controlled prior to arrival.-Monique,RN

## 2017-04-11 NOTE — ED Notes (Signed)
Pt came in with patent single lumen PICC in rt arm. CT states they can use PICC line for CT of neck. Labs drawn from PICC line, PIV not established at this time.

## 2017-04-11 NOTE — ED Notes (Signed)
Pt has hx of MS per EMS-Monique,RN

## 2017-04-11 NOTE — ED Provider Notes (Signed)
[facility] EMERGENCY DEPARTMENT Provider Note   CSN: [id number] Arrival date & time:        History   Chief Complaint Chief Complaint  Patient presents with  . Wound Check    HPI Dylan [name] is a 52 y.o. male with a hx of IDDM, HTN, multiple sclerosis, a-fib presents to the Emergency Department complaining of gradual, persistent, progressively worsening expanding hematoma onset earlier today. Associated symptoms include bleeding from the site.  Pressure was applied with resolution of the external bleeding, but the hematoma continues to expand.  No known aggravating symptoms. Pt denies fever, chills, headache, chest pain, shortness of breath, abdominal pain, nausea, vomiting, diarrhea, weakness, dizziness, syncope.  Patient reports his neck feels very full but he is not having any specific problems swallowing or breathing.  MAR from [place] at [facility] ([address]), shows no blood thinners.  Patient reports he is not taking any blood thinners.    Record review shows that patient was admitted on 03/21/2017 with back pain and weakness in his legs.  He was discharged on 08/07/1605 after a complicated hospital course.  He presented with progressive generalized weakness and found to have cord compression at C5-C6, C6-C7 due to severe cervical stenosis and likely demyelinating disease. Treatment included IV steroids and spinal decompression on 2/21 by neurosurgery (Dr. Kathyrn Sheriff). Hospital course was complicated by new onset atrial fibrillation with RVR requiring initiation of amiodarone drip on 2/27 which has since been transitioned to oral amiodarone and diltiazem. Anticoagulation was discontinued due to concern for possible hematoma of the spinal cord found on MRI of the C spineon 03/25/17. It showed fluid collection with resultant recurrent cord compression. Neurosurgery re-evaluated the patient but felt he was not a candidate forevacuation of hematomagiven  the poor chance of meaningful recovery andthe high risk associated with the procedure.  Pt with SIRS vs Sepsis with repeat blood cultures on 04/07/17 and 04/01/17 remained negative.  Plan ws to continue empiric Cefepime and Vancomycin (end date 05/16/17).  Pt did have a UTI.  Pt was d/c on 04/09/17 to SNF for continuation of IV abx and rehab.  Plan for decadron taper.     The history is provided by the patient and medical records. No language interpreter was used.    Past Medical History:  Diagnosis Date  . 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 Diabetes mellitus without complication ([place])   . Hypertension   . MS (congenital mitral stenosis) 2019  . Persistent atrial fibrillation with rapid ventricular response ([place]) 03/27/2017    Patient Active Problem List   Diagnosis Date Noted  . Hematoma 04/12/2017  . Hyperkalemia 04/08/2017  . AKI (acute kidney injury) ([place]) 04/08/2017  . Pressure injury of skin 04/03/2017  . Multiple sclerosis exacerbation ([place]) 03/30/2017  . New onset a-fib ([place])   . Persistent atrial fibrillation with rapid ventricular response ([place]) 03/27/2017  . Paroxysmal atrial fibrillation ([facility])   . Spinal stenosis, multiple sites in spine 03/23/2017  . Morbid obesity ([place]) 03/22/2017  . Uncontrolled diabetes mellitus type 2 without complications ([place]) 37/11/6267  . Hypertension 03/21/2017    Past Surgical History:  Procedure Laterality Date  . ANTERIOR CERVICAL DECOMP/DISCECTOMY FUSION N/A 03/25/2017   Procedure: ANTERIOR CERVICAL DECOMPRESSION/DISCECTOMY FUSION CERVICAL FIVE- CERVICAL SIX, CERVICAL SIX- CERVICAL SEVEN, CORPECTOMY;  Surgeon: Consuella Lose, MD;  Location: [facility];  Service: Neurosurgery;  Laterality: N/A;  . NOSE SURGERY     AS A CHILD  Home Medications    Prior to Admission medications   Medication Sig Start Date End Date Taking? Authorizing Provider  acetaminophen (TYLENOL) 325 MG tablet Take 2 tablets (650 mg total) by mouth every 6 (six)  hours as needed for mild pain (or Fever >/= 101). 04/09/17  Yes Cherene Altes, MD  amiodarone (PACERONE) 200 MG tablet Take 1 tablet (200 mg total) by mouth daily. 04/10/17  Yes Cherene Altes, MD  b complex vitamins tablet Take 1 tablet by mouth daily.   Yes [provider]  bisacodyl (DULCOLAX) 5 MG EC tablet Take 1 tablet (5 mg total) by mouth daily as needed for moderate constipation. 04/09/17  Yes Cherene Altes, MD  ceFEPime (MAXIPIME) IVPB Inject 2 g into the vein every 12 (twelve) hours. Indication:  Spinal abscess Last Day of Therapy:  05/16/2017 Labs - Once weekly:  CBC/D and BMP, Labs - Every other week:  ESR and CRP 04/09/17  Yes Cherene Altes, MD  dexamethasone (DECADRON) 1.5 MG tablet Take 2 tablets (3 mg total) by mouth every 8 (eight) hours. 04/09/17  Yes Cherene Altes, MD  diltiazem (CARDIZEM) 30 MG tablet Take 1 tablet (30 mg total) by mouth every 8 (eight) hours. 04/09/17  Yes Cherene Altes, MD  feeding supplement, GLUCERNA SHAKE, (GLUCERNA SHAKE) LIQD Take 237 mLs by mouth 3 (three) times daily between meals. 04/09/17  Yes Cherene Altes, MD  insulin aspart (NOVOLOG) 100 UNIT/ML injection Inject 6 Units into the skin 3 (three) times daily with meals. Patient taking differently: Inject 6 Units into the skin 3 (three) times daily with meals. Sliding Scale If Blood sugar 0-120 =0 units call provider if less than 60 121-150= 2 units 151-200=4 units 201-250 =6 units 251-300 = 8 units 301-350 = 10 units 351-400=12 units 401-450=14 units Call provider if if Greater then 450 04/09/17  Yes McClung, Kimberlee Nearing, MD  insulin glargine (LANTUS) 100 UNIT/ML injection Inject 0.6 mLs (60 Units total) into the skin at bedtime. 04/09/17  Yes Cherene Altes, MD  methocarbamol (ROBAXIN) 500 MG tablet Take 1 tablet (500 mg total) by mouth every 6 (six) hours as needed for muscle spasms. 04/09/17  Yes Cherene Altes, MD  ondansetron (ZOFRAN) 4 MG tablet Take 1 tablet  (4 mg total) by mouth every 6 (six) hours as needed for nausea. 04/09/17  Yes Cherene Altes, MD  pantoprazole (PROTONIX) 40 MG tablet Take 1 tablet (40 mg total) by mouth daily. 04/10/17  Yes Cherene Altes, MD  senna-docusate (SENOKOT-S) 8.6-50 MG tablet Take 1 tablet by mouth at bedtime as needed for mild constipation. 04/09/17  Yes Joette Catching T, MD  sodium chloride 0.9 % infusion Inject 10 mLs into the vein continuous. 04/09/17  Yes Cherene Altes, MD  traZODone (DESYREL) 50 MG tablet Take 1 tablet (50 mg total) by mouth at bedtime as needed for sleep. 04/09/17  Yes Cherene Altes, MD  vancomycin IVPB Inject 500 mg into the vein every 12 (twelve) hours. Indication:  Spinal abscess Last Day of Therapy:  05/16/2017 Labs - Sunday/Monday:  CBC/D, BMP, and vancomycin trough. Labs - Thursday:  BMP and vancomycin trough Labs - Every other week:  ESR and CRP 04/09/17  Yes Cherene Altes, MD  ceFEPIme 2 g in sodium chloride 0.9 % 100 mL Inject 2 g into the vein every 12 (twelve) hours. 04/10/17   Cherene Altes, MD  insulin aspart (NOVOLOG) 100 UNIT/ML injection Inject 0-20  Units into the skin 3 (three) times daily with meals. Patient not taking: Reported on 04/11/2017 04/09/17   Cherene Altes, MD  insulin aspart (NOVOLOG) 100 UNIT/ML injection Inject 0-5 Units into the skin at bedtime. Patient not taking: Reported on 04/11/2017 04/09/17   Joette Catching T, MD  vancomycin 500 mg in sodium chloride 0.9 % 100 mL Inject 500 mg into the vein every 12 (twelve) hours. Patient not taking: Reported on 04/11/2017 04/09/17   Cherene Altes, MD    Family History Family History  Problem Relation Age of Onset  . Prostate cancer Father     Social History Social History   Tobacco Use  . Smoking status: Never Smoker  . Smokeless tobacco: Never Used  Substance Use Topics  . Alcohol use: No    Frequency: Never  . Drug use: No     Allergies   Patient has no known  allergies.   Review of Systems Review of Systems  Constitutional: Negative for appetite change, diaphoresis, fatigue, fever and unexpected weight change.  HENT: Negative for mouth sores.   Eyes: Negative for visual disturbance.  Respiratory: Negative for cough, chest tightness, shortness of breath and wheezing.   Cardiovascular: Negative for chest pain.  Gastrointestinal: Negative for abdominal pain, constipation, diarrhea, nausea and vomiting.  Endocrine: Negative for polydipsia, polyphagia and polyuria.  Genitourinary: Negative for dysuria, frequency, hematuria and urgency.  Musculoskeletal: Positive for neck pain (soreness and fullness). Negative for back pain and neck stiffness.  Skin: Positive for wound. Negative for rash.       Large hematoma of the neck  Allergic/Immunologic: Negative for immunocompromised state.  Neurological: Negative for syncope, light-headedness and headaches.  Hematological: Does not bruise/bleed easily.  Psychiatric/Behavioral: Negative for sleep disturbance. The patient is not nervous/anxious.      Physical Exam Updated Vital Signs BP 131/77   Pulse 62   Temp 98.6 F (37 C) (Oral)   Resp 15   SpO2 100%   Physical Exam  Constitutional: He appears well-developed and well-nourished. No distress.  Awake, alert, nontoxic appearance  HENT:  Head: Normocephalic and atraumatic.  Mouth/Throat: Oropharynx is clear and moist. No oropharyngeal exudate.  Eyes: Conjunctivae are normal. No scleral icterus.  Neck: Normal range of motion and phonation normal. Neck supple. Edema present. No tracheal deviation present.  Large hematoma of the right neck Surgical incision with some bleeding. Trachea does not appear deviated.  No stridor. Patient handling secretions.  Cardiovascular: Normal rate, regular rhythm and intact distal pulses.  Pulses:      Radial pulses are 2+ on the right side, and 2+ on the left side.  Pulmonary/Chest: Effort normal and breath  sounds normal. No respiratory distress. He has no wheezes.  Equal chest expansion  Abdominal: Soft. Bowel sounds are normal. He exhibits no mass. There is no tenderness. There is no rebound and no guarding.  Neurological: He is alert.  Speech is clear and goal oriented Moves extremities without ataxia  Skin: Skin is warm and dry. He is not diaphoretic.  Psychiatric: He has a normal mood and affect.  Nursing note and vitals reviewed.      ED Treatments / Results  Labs (all labs ordered are listed, but only abnormal results are displayed) Labs Reviewed  CBC WITH DIFFERENTIAL/PLATELET - Abnormal; Notable for the following components:      Result Value   WBC 18.0 (*)    RBC 3.59 (*)    Hemoglobin 10.9 (*)    HCT 30.9 (*)  Neutro Abs 16.5 (*)    All other components within normal limits  COMPREHENSIVE METABOLIC PANEL - Abnormal; Notable for the following components:   Potassium 5.4 (*)    CO2 19 (*)    Glucose, Bld 307 (*)    BUN 44 (*)    Creatinine, Ser 1.68 (*)    Calcium 8.2 (*)    Total Protein 5.6 (*)    Albumin 2.2 (*)    ALT 68 (*)    GFR calc non Af Amer 46 (*)    GFR calc Af Amer 54 (*)    All other components within normal limits  PROTIME-INR - Abnormal; Notable for the following components:   Prothrombin Time 16.7 (*)    All other components within normal limits  APTT    EKG  EKG Interpretation  Date/Time:  Monday April 12 2017 00:48:10 EDT Ventricular Rate:  65 PR Interval:    QRS Duration: 76 QT Interval:  400 QTC Calculation: 416 R Axis:   57 Text Interpretation:  Sinus rhythm Abnormal R-wave progression, early transition Minimal ST elevation, inferior leads unchanged from previous No significant change since last tracing Confirmed by Orpah Greek [phone]) on 04/12/2017 2:20:14 AM       Radiology Ct Soft Tissue Neck W Contrast  Result Date: 04/12/2017 CLINICAL DATA:  52 y/o M; bleeding from surgical wound on right neck. History of  nose surgery and anterior cervical discectomy/fusion 03/25/2017. EXAM: CT NECK WITH CONTRAST TECHNIQUE: Multidetector CT imaging of the neck was performed using the standard protocol following the bolus administration of intravenous contrast. CONTRAST:  47m ISOVUE-300 IOPAMIDOL (ISOVUE-300) INJECTION 61% COMPARISON:  04/01/2017 MRI of the cervical spine. FINDINGS: Pharynx and larynx: Normal. No mass or swelling. Salivary glands: No inflammation, mass, or stone. Thyroid: Normal. Lymph nodes: None enlarged or abnormal density. Vascular: Negative. Limited intracranial: Negative. Visualized orbits: Negative. Mastoids and visualized paranasal sinuses: Clear. Skeleton: C5-C7 anterior cervical fusion and C6 corpectomy with prosthesis. There is a large prevertebral hematoma thickening the prevertebral space extending from C5 to below the field of view in the right anterior and right anterolateral prevertebral space. The hematoma measures up to 25 mm in thickness at the T1 level which is similar to the prior cervical MRI given differences in technique. Additionally, there is soft tissue attenuation masslike lesion extending from right of the thyroid gland, anterior to the sternocleidomastoid muscle, and into the right anterior cervical triangle likely representing a contiguous region of hematoma measuring 4.6 x 4.2 x 5.7 cm (AP x ML x CC series 3, image 53 and series 9, image 36). This area was not included on the prior cervical MRI due to saturation banding and technique. The right anterior cervical triangle area of hematoma is contiguous with a defect in the skin likely representing the incision site in the right lateral neck (series 3, image 52). Upper chest: Clear lungs. Other: Negative. IMPRESSION: 1. Large prevertebral hematoma extending from C5 to below field of view in the right anterior and lateral prevertebral space, grossly stable from prior cervical MRI given differences in technique. 2. Contiguous area of  hematoma extending right lateral to thyroid gland, anterior to right sternocleidomastoid muscle, to right anterior cervical triangle. This likely represents the surgical approach. The hematoma extends to the surgical incision in the right anterior neck. This area of hemorrhage was excluded from the field of view on prior cervical MRI, and is of uncertain stability. Electronically Signed   By: LEdgardo RoysD.  On: 04/12/2017 02:47    Procedures Procedures (including critical care time)  Medications Ordered in ED Medications  0.9 %  sodium chloride infusion (not administered)  bisacodyl (DULCOLAX) EC tablet 5 mg (not administered)  dexamethasone (DECADRON) tablet 3 mg (not administered)  diltiazem (CARDIZEM) tablet 30 mg (not administered)  feeding supplement (GLUCERNA SHAKE) (GLUCERNA SHAKE) liquid 237 mL (not administered)  insulin glargine (LANTUS) injection 60 Units (not administered)  methocarbamol (ROBAXIN) tablet 500 mg (not administered)  pantoprazole (PROTONIX) EC tablet 40 mg (not administered)  senna-docusate (Senokot-S) tablet 1 tablet (not administered)  traZODone (DESYREL) tablet 50 mg (not administered)  amiodarone (PACERONE) tablet 200 mg (not administered)  ceFEPIme (MAXIPIME) 2 g in sodium chloride 0.9 % 100 mL IVPB (not administered)  vancomycin (VANCOCIN) 500 mg in sodium chloride 0.9 % 100 mL IVPB (not administered)  insulin aspart (novoLOG) injection 0-15 Units (not administered)  insulin aspart (novoLOG) injection 0-5 Units (not administered)  0.9 %  sodium chloride infusion ( Intravenous New Bag/Given 04/12/17 0521)  sodium chloride flush (NS) 0.9 % injection 3 mL (not administered)  sodium chloride flush (NS) 0.9 % injection 3 mL (not administered)  0.9 %  sodium chloride infusion (not administered)  acetaminophen (TYLENOL) tablet 650 mg (not administered)    Or  acetaminophen (TYLENOL) suppository 650 mg (not administered)  HYDROcodone-acetaminophen  (NORCO/VICODIN) 5-325 MG per tablet 1-2 tablet (not administered)  zolpidem (AMBIEN) tablet 5 mg (not administered)  docusate sodium (COLACE) capsule 100 mg (not administered)  polyethylene glycol (MIRALAX / GLYCOLAX) packet 17 g (not administered)  bisacodyl (DULCOLAX) suppository 10 mg (not administered)  sodium phosphate (FLEET) 7-19 GM/118ML enema 1 enema (not administered)  ondansetron (ZOFRAN) tablet 4 mg (not administered)    Or  ondansetron (ZOFRAN) injection 4 mg (not administered)  HYDROmorphone (DILAUDID) injection 1 mg (not administered)  sodium chloride 0.9 % bolus 500 mL (0 mLs Intravenous Stopped 04/12/17 0321)  iopamidol (ISOVUE-300) 61 % injection (75 mLs  Contrast Given 04/12/17 0146)  vancomycin (VANCOCIN) 500 mg in sodium chloride 0.9 % 100 mL IVPB (500 mg Intravenous New Bag/Given 04/12/17 0521)     Initial Impression / Assessment and Plan / ED Course  I have reviewed the triage vital signs and the nursing notes.  Pertinent labs & imaging results that were available during my care of the patient were reviewed by me and considered in my medical decision making (see chart for details).  Clinical Course as of Apr 13 623  Mon Apr 12, 2017  0025 Noted.  Fluids ordered Potassium: (!) 5.4 [HM]  0346 Pt to be evaluated by Neurosurgery  [HM]    Clinical Course User Index [HM] Kashon Kraynak, Gwenlyn Perking    Presents with enlarging hematoma of the right neck.  Patient with a postop hematoma prior to discharge however the external hematoma seems to have appeared in the last 24 hours.  Without current respiratory distress and is handling secretions however concern for potential airway compromise with continuously expanding hematoma.  Labs show mild hyperkalemia.  Small fluid bolus given.  EKG without evidence of peaked T waves or other cardiac sequela.  CT scan shows a prevertebral hematoma extending from C5 to be on the field-of-view in addition to a contiguous area of  hematoma extending right lateral to thyroid gland, anterior to right sternocleidomastoid muscle, to right anterior cervical triangle  It is not currently compromising his airway however concern for potential.  I personally evaluated these images.  Neurosurgery consulted who evaluated the patient  and will admit for monitoring.  They are going to keep the patient n.p.o. at this time.  The patient was discussed with and seen by Dr. Betsey Holiday who agrees with the treatment plan.   Final Clinical Impressions(s) / ED Diagnoses   Final diagnoses:  Postoperative hematoma of skin following non-dermatologic procedure  Hematoma  Hyperkalemia    ED Discharge Orders    None       Agapito Games 04/12/17 2800    Orpah Greek, MD 04/15/17 [phone]

## 2017-04-12 ENCOUNTER — Emergency Department (HOSPITAL_COMMUNITY): Payer: Medicaid Other

## 2017-04-12 DIAGNOSIS — T148XXA Other injury of unspecified body region, initial encounter: Secondary | ICD-10-CM | POA: Diagnosis present

## 2017-04-12 LAB — GLUCOSE, CAPILLARY
GLUCOSE-CAPILLARY: 231 mg/dL — AB (ref 65–99)
GLUCOSE-CAPILLARY: 249 mg/dL — AB (ref 65–99)
GLUCOSE-CAPILLARY: 261 mg/dL — AB (ref 65–99)
Glucose-Capillary: 210 mg/dL — ABNORMAL HIGH (ref 65–99)

## 2017-04-12 LAB — CULTURE, BLOOD (ROUTINE X 2)
CULTURE: NO GROWTH
Culture: NO GROWTH
SPECIAL REQUESTS: ADEQUATE
Special Requests: ADEQUATE

## 2017-04-12 MED ORDER — INSULIN ASPART 100 UNIT/ML ~~LOC~~ SOLN: 0.0000 [IU] | Freq: Three times a day (TID) | SUBCUTANEOUS | Status: DC

## 2017-04-12 MED ORDER — DOCUSATE SODIUM 100 MG PO CAPS
100.0000 mg | ORAL_CAPSULE | Freq: Two times a day (BID) | ORAL | Status: DC
  Administered 2017-04-12 (×2): 100 mg via ORAL
  Filled 2017-04-12 (×3): qty 1

## 2017-04-12 MED ORDER — INSULIN GLARGINE 100 UNIT/ML ~~LOC~~ SOLN
60.0000 [IU] | Freq: Every day | SUBCUTANEOUS | Status: DC
Start: 2017-04-12 — End: 2017-04-13
  Administered 2017-04-12: 60 [IU] via SUBCUTANEOUS
  Filled 2017-04-12: qty 0.6

## 2017-04-12 MED ORDER — SODIUM CHLORIDE 0.9% FLUSH
3.0000 mL | INTRAVENOUS | Status: DC | PRN
  Administered 2017-04-13: 10 mL via INTRAVENOUS
  Filled 2017-04-12: qty 3

## 2017-04-12 MED ORDER — ONDANSETRON HCL 4 MG/2ML IJ SOLN: 4.0000 mg | Freq: Four times a day (QID) | INTRAMUSCULAR | Status: DC | PRN

## 2017-04-12 MED ORDER — HYDROMORPHONE HCL 1 MG/ML IJ SOLN: 1.0000 mg | INTRAMUSCULAR | Status: DC | PRN

## 2017-04-12 MED ORDER — ACETAMINOPHEN 650 MG RE SUPP: 650.0000 mg | Freq: Four times a day (QID) | RECTAL | Status: DC | PRN

## 2017-04-12 MED ORDER — METHOCARBAMOL 500 MG PO TABS: 500.0000 mg | ORAL_TABLET | Freq: Four times a day (QID) | ORAL | Status: DC | PRN

## 2017-04-12 MED ORDER — BISACODYL 5 MG PO TBEC: 5.0000 mg | DELAYED_RELEASE_TABLET | Freq: Every day | ORAL | Status: DC | PRN

## 2017-04-12 MED ORDER — BISACODYL 10 MG RE SUPP: 10.0000 mg | Freq: Every day | RECTAL | Status: DC | PRN

## 2017-04-12 MED ORDER — ACETAMINOPHEN 325 MG PO TABS: 650.0000 mg | ORAL_TABLET | Freq: Four times a day (QID) | ORAL | Status: DC | PRN

## 2017-04-12 MED ORDER — ONDANSETRON HCL 4 MG PO TABS: 4.0000 mg | ORAL_TABLET | Freq: Four times a day (QID) | ORAL | Status: DC | PRN

## 2017-04-12 MED ORDER — ZOLPIDEM TARTRATE 5 MG PO TABS: 5.0000 mg | ORAL_TABLET | Freq: Every evening | ORAL | Status: DC | PRN

## 2017-04-12 MED ORDER — GLUCERNA SHAKE PO LIQD
237.0000 mL | Freq: Three times a day (TID) | ORAL | Status: DC
  Administered 2017-04-12 – 2017-04-13 (×3): 237 mL via ORAL

## 2017-04-12 MED ORDER — SODIUM CHLORIDE 0.9 % IV BOLUS (SEPSIS)
500.0000 mL | Freq: Once | INTRAVENOUS | Status: AC
  Administered 2017-04-12: 500 mL via INTRAVENOUS

## 2017-04-12 MED ORDER — IOPAMIDOL (ISOVUE-300) INJECTION 61%
INTRAVENOUS | Status: AC
  Administered 2017-04-12: 75 mL
  Filled 2017-04-12: qty 75

## 2017-04-12 MED ORDER — VANCOMYCIN HCL 500 MG IV SOLR
500.0000 mg | Freq: Two times a day (BID) | INTRAVENOUS | Status: DC
  Administered 2017-04-12 – 2017-04-13 (×2): 500 mg via INTRAVENOUS
  Filled 2017-04-12 (×3): qty 500

## 2017-04-12 MED ORDER — DEXAMETHASONE 2 MG PO TABS
3.0000 mg | ORAL_TABLET | Freq: Three times a day (TID) | ORAL | Status: DC
  Administered 2017-04-12 – 2017-04-13 (×3): 3 mg via ORAL
  Filled 2017-04-12 (×4): qty 2

## 2017-04-12 MED ORDER — AMIODARONE HCL 200 MG PO TABS
200.0000 mg | ORAL_TABLET | Freq: Every day | ORAL | Status: DC
  Administered 2017-04-12 – 2017-04-13 (×2): 200 mg via ORAL
  Filled 2017-04-12 (×2): qty 1

## 2017-04-12 MED ORDER — INSULIN ASPART 100 UNIT/ML ~~LOC~~ SOLN
0.0000 [IU] | Freq: Every day | SUBCUTANEOUS | Status: DC
  Administered 2017-04-12: 3 [IU] via SUBCUTANEOUS

## 2017-04-12 MED ORDER — POLYETHYLENE GLYCOL 3350 17 G PO PACK: 17.0000 g | PACK | Freq: Every day | ORAL | Status: DC | PRN

## 2017-04-12 MED ORDER — SENNOSIDES-DOCUSATE SODIUM 8.6-50 MG PO TABS: 1.0000 | ORAL_TABLET | Freq: Every evening | ORAL | Status: DC | PRN

## 2017-04-12 MED ORDER — DILTIAZEM HCL 30 MG PO TABS
30.0000 mg | ORAL_TABLET | Freq: Three times a day (TID) | ORAL | Status: DC
  Administered 2017-04-12 – 2017-04-13 (×3): 30 mg via ORAL
  Filled 2017-04-12 (×5): qty 1

## 2017-04-12 MED ORDER — SODIUM CHLORIDE 0.9% FLUSH
3.0000 mL | Freq: Two times a day (BID) | INTRAVENOUS | Status: DC
  Administered 2017-04-13 (×2): 3 mL via INTRAVENOUS

## 2017-04-12 MED ORDER — INSULIN ASPART 100 UNIT/ML ~~LOC~~ SOLN
0.0000 [IU] | Freq: Three times a day (TID) | SUBCUTANEOUS | Status: DC
  Administered 2017-04-12 – 2017-04-13 (×4): 5 [IU] via SUBCUTANEOUS

## 2017-04-12 MED ORDER — INSULIN ASPART 100 UNIT/ML ~~LOC~~ SOLN: 0.0000 [IU] | Freq: Every day | SUBCUTANEOUS | Status: DC

## 2017-04-12 MED ORDER — VANCOMYCIN HCL 500 MG IV SOLR
500.0000 mg | INTRAVENOUS | Status: AC
  Administered 2017-04-12: 500 mg via INTRAVENOUS
  Filled 2017-04-12: qty 500

## 2017-04-12 MED ORDER — SODIUM CHLORIDE 0.9 % IV SOLN
2.0000 g | Freq: Two times a day (BID) | INTRAVENOUS | Status: DC
  Administered 2017-04-12 – 2017-04-13 (×3): 2 g via INTRAVENOUS
  Filled 2017-04-12 (×4): qty 2

## 2017-04-12 MED ORDER — FLEET ENEMA 7-19 GM/118ML RE ENEM: 1.0000 | ENEMA | Freq: Once | RECTAL | Status: DC | PRN

## 2017-04-12 MED ORDER — SODIUM CHLORIDE 0.9 % IV SOLN: 10.0000 mL | INTRAVENOUS | Status: DC

## 2017-04-12 MED ORDER — SODIUM CHLORIDE 0.9 % IV SOLN
INTRAVENOUS | Status: DC
  Administered 2017-04-12: 05:00:00 via INTRAVENOUS

## 2017-04-12 MED ORDER — SODIUM CHLORIDE 0.9 % IV SOLN: 250.0000 mL | INTRAVENOUS | Status: DC | PRN

## 2017-04-12 MED ORDER — PANTOPRAZOLE SODIUM 40 MG PO TBEC
40.0000 mg | DELAYED_RELEASE_TABLET | Freq: Every day | ORAL | Status: DC
  Administered 2017-04-12 – 2017-04-13 (×2): 40 mg via ORAL
  Filled 2017-04-12 (×2): qty 1

## 2017-04-12 MED ORDER — TRAZODONE HCL 50 MG PO TABS: 50.0000 mg | ORAL_TABLET | Freq: Every evening | ORAL | Status: DC | PRN

## 2017-04-12 MED ORDER — HYDROCODONE-ACETAMINOPHEN 5-325 MG PO TABS: 1.0000 | ORAL_TABLET | ORAL | Status: DC | PRN

## 2017-04-12 NOTE — ED Notes (Signed)
Patient transported to CT 

## 2017-04-12 NOTE — Progress Notes (Signed)
Pharmacy Antibiotic Note  Dylan [name] is a 52 y.o. male admitted on 04/11/2017 with bleeding from surgical site on antibiotics for cervical spine abscess, UTI.  Pharmacy has been consulted for Vancomycin and Cefepime dosing. SCr remains stable at 1.68.   Last vancomycin trough on 3/6 was 19 on 750 mg IV every 12 hours and dose was adjusted to 500 mg IV every 12 hours at that time.  Original OPAT end date = 05/16/17  Plan: Continue Cefepime 2 gm IV every 12 hours Continue Vancomycin 500 mg IV every 12 hours Will plan for repeat Vancomycin trough on Tuesday or Wednesday Monitor renal function, clinical status, and culture results Follow-up plan of care and need for repeat OPAT if plan to discharge  Height: 6\' 4"  (193 cm) Weight: 291 lb 10.7 oz (132.3 kg) IBW/kg (Calculated) : 86.8  Temp (24hrs), Avg:98.6 F (37 C), Min:98.5 F (36.9 C), Max:98.6 F (37 C)  Recent Labs  Lab 04/05/17 1957 04/06/17 0500 04/07/17 0520 04/07/17 1918 04/08/17 0503 04/08/17 1222 04/09/17 1049 04/11/17 2306  WBC  --   --  19.3*  --  16.8*  --  14.8* 18.0*  CREATININE  --  1.56* 1.68*  --   --  1.75* 1.69* 1.68*  VANCOTROUGH 18  --   --  19  --   --   --   --     Estimated Creatinine Clearance: 79 mL/min (A) (by C-G formula based on SCr of 1.68 mg/dL (H)).    No Known Allergies  Antimicrobials this admission: Vancomycin 2/28 >> Cefepime 2/28 >>  Dose adjustments this admission: Last VT = 19 on 04/07/17 (SCr 1.68) on 750 q12, dose reduced to 500 q12  Microbiology results: 2/28 Urine: 50K Enterobacter, >100K Ecoli   Thank you for allowing pharmacy to be a part of this patient's care.  Link Snuffer, PharmD, BCPS, BCCCP Clinical Pharmacist Clinical phone 04/12/2017 until 3:30PM [phone] After hours, please call #[phone] 04/12/2017 7:51 AM

## 2017-04-12 NOTE — Progress Notes (Signed)
Attempt was made last admission to get patient to [facility] in [place] but they did not have any charity beds. CM called to see if they currently have any charity beds. CM spoke with the Triage Nurse and they do not have any available beds. Plan is for patient to return to [facility] when medically stable.

## 2017-04-12 NOTE — H&P (Addendum)
Chief Complaint   Chief Complaint  Patient presents with  . Wound Check   HPI   HPI: Dylan [name] is a 52 y.o. male who presented to ER due to bleeding from surgical site. Very unfortunate situation for this patient over the course of the past several weeks. He was admitted for weakness  On 03/21/17 and was found to have three level spinal stenosis. It was also noted he had demyelinating plaques throughout cord. He was diagnosed with MS and spinal stenosis. It was determined he needed to undergo cervical decompression to help with symptoms. He underwent C6 corpectomy with anterior instrumentation from C5-7 on 2/21. He had a fairly unfortunate post operative course. He developed Afib and was subsequently placed on Eliquis. Shortly thereafter, he became motor complete below C8 secondary to epidural clot vs abscess seen on C spine MRI. Because risks of repeat surgery greatly outweighed the benefits, it was decided to not forgo repeat exploratory surgery. He was subsequently started on empiric IV abx (due to complicated UTI vs. Cervical abscess) and discharged to SNF on 04/09/2017. Today, he reports he has had some bleeding from surgical site. He has also noticed gradual, progressive swelling at neck deep to surgical site. This is evident on CT neck which shows large prevertebral hematoma. He denies airway compromise or difficulties with eating but he is concerned about the rate of increase in swelling over the course of the past 24 hours. Not currently on blood thinners.  Patient Active Problem List   Diagnosis Date Noted  . Hematoma 04/12/2017  . Hyperkalemia 04/08/2017  . AKI (acute kidney injury) ([facility]) 04/08/2017  . Pressure injury of skin 04/03/2017  . Multiple sclerosis exacerbation ([facility]) 03/30/2017  . New onset a-fib ([facility])   . Persistent atrial fibrillation with rapid ventricular response ([facility]) 03/27/2017  . Paroxysmal atrial fibrillation ([facility])   . Spinal stenosis, multiple sites in spine  03/23/2017  . Morbid obesity ([place]) 03/22/2017  . Uncontrolled diabetes mellitus type 2 without complications ([place] [place]) 95/18/8416  . Hypertension 03/21/2017    PMH: Past Medical History:  Diagnosis Date  . 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 Diabetes mellitus without complication ([facility])   . Hypertension   . MS (congenital mitral stenosis) 2019  . Persistent atrial fibrillation with rapid ventricular response ([facility]) 03/27/2017    PSH: Past Surgical History:  Procedure Laterality Date  . ANTERIOR CERVICAL DECOMP/DISCECTOMY FUSION N/A 03/25/2017   Procedure: ANTERIOR CERVICAL DECOMPRESSION/DISCECTOMY FUSION CERVICAL FIVE- CERVICAL SIX, CERVICAL SIX- CERVICAL SEVEN, CORPECTOMY;  Surgeon: Consuella Lose, MD;  Location: [facility];  Service: Neurosurgery;  Laterality: N/A;  . NOSE SURGERY     AS A CHILD     (Not in a hospital admission)  SH: Social History   Tobacco Use  . Smoking status: Never Smoker  . Smokeless tobacco: Never Used  Substance Use Topics  . Alcohol use: No    Frequency: Never  . Drug use: No    MEDS: Prior to Admission medications   Medication Sig Start Date End Date Taking? Authorizing Provider  acetaminophen (TYLENOL) 325 MG tablet Take 2 tablets (650 mg total) by mouth every 6 (six) hours as needed for mild pain (or Fever >/= 101). 04/09/17  Yes Cherene Altes, MD  amiodarone (PACERONE) 200 MG tablet Take 1 tablet (200 mg total) by mouth daily. 04/10/17  Yes Cherene Altes, MD  b complex vitamins tablet Take 1 tablet by mouth daily.   Yes [provider]  bisacodyl (DULCOLAX) 5  MG EC tablet Take 1 tablet (5 mg total) by mouth daily as needed for moderate constipation. 04/09/17  Yes Cherene Altes, MD  ceFEPime (MAXIPIME) IVPB Inject 2 g into the vein every 12 (twelve) hours. Indication:  Spinal abscess Last Day of Therapy:  05/16/2017 Labs - Once weekly:  CBC/D and BMP, Labs - Every other week:  ESR and CRP 04/09/17  Yes Cherene Altes, MD   dexamethasone (DECADRON) 1.5 MG tablet Take 2 tablets (3 mg total) by mouth every 8 (eight) hours. 04/09/17  Yes Cherene Altes, MD  diltiazem (CARDIZEM) 30 MG tablet Take 1 tablet (30 mg total) by mouth every 8 (eight) hours. 04/09/17  Yes Cherene Altes, MD  feeding supplement, GLUCERNA SHAKE, (GLUCERNA SHAKE) LIQD Take 237 mLs by mouth 3 (three) times daily between meals. 04/09/17  Yes Cherene Altes, MD  insulin aspart (NOVOLOG) 100 UNIT/ML injection Inject 6 Units into the skin 3 (three) times daily with meals. Patient taking differently: Inject 6 Units into the skin 3 (three) times daily with meals. Sliding Scale If Blood sugar 0-120 =0 units call provider if less than 60 121-150= 2 units 151-200=4 units 201-250 =6 units 251-300 = 8 units 301-350 = 10 units 351-400=12 units 401-450=14 units Call provider if if Greater then 450 04/09/17  Yes McClung, Kimberlee Nearing, MD  insulin glargine (LANTUS) 100 UNIT/ML injection Inject 0.6 mLs (60 Units total) into the skin at bedtime. 04/09/17  Yes Cherene Altes, MD  methocarbamol (ROBAXIN) 500 MG tablet Take 1 tablet (500 mg total) by mouth every 6 (six) hours as needed for muscle spasms. 04/09/17  Yes Cherene Altes, MD  ondansetron (ZOFRAN) 4 MG tablet Take 1 tablet (4 mg total) by mouth every 6 (six) hours as needed for nausea. 04/09/17  Yes Cherene Altes, MD  pantoprazole (PROTONIX) 40 MG tablet Take 1 tablet (40 mg total) by mouth daily. 04/10/17  Yes Cherene Altes, MD  senna-docusate (SENOKOT-S) 8.6-50 MG tablet Take 1 tablet by mouth at bedtime as needed for mild constipation. 04/09/17  Yes Joette Catching T, MD  sodium chloride 0.9 % infusion Inject 10 mLs into the vein continuous. 04/09/17  Yes Cherene Altes, MD  traZODone (DESYREL) 50 MG tablet Take 1 tablet (50 mg total) by mouth at bedtime as needed for sleep. 04/09/17  Yes Cherene Altes, MD  vancomycin IVPB Inject 500 mg into the vein every 12 (twelve) hours. Indication:   Spinal abscess Last Day of Therapy:  05/16/2017 Labs - Sunday/Monday:  CBC/D, BMP, and vancomycin trough. Labs - Thursday:  BMP and vancomycin trough Labs - Every other week:  ESR and CRP 04/09/17  Yes Cherene Altes, MD  ceFEPIme 2 g in sodium chloride 0.9 % 100 mL Inject 2 g into the vein every 12 (twelve) hours. 04/10/17   Cherene Altes, MD  insulin aspart (NOVOLOG) 100 UNIT/ML injection Inject 0-20 Units into the skin 3 (three) times daily with meals. Patient not taking: Reported on 04/11/2017 04/09/17   Cherene Altes, MD  insulin aspart (NOVOLOG) 100 UNIT/ML injection Inject 0-5 Units into the skin at bedtime. Patient not taking: Reported on 04/11/2017 04/09/17   Joette Catching T, MD  vancomycin 500 mg in sodium chloride 0.9 % 100 mL Inject 500 mg into the vein every 12 (twelve) hours. Patient not taking: Reported on 04/11/2017 04/09/17   Cherene Altes, MD    ALLERGY: No Known Allergies  Social History  Tobacco Use  . Smoking status: Never Smoker  . Smokeless tobacco: Never Used  Substance Use Topics  . Alcohol use: No    Frequency: Never     Family History  Problem Relation Age of Onset  . Prostate cancer Father      ROS   ROS  Exam   Vitals:   04/12/17 0400 04/12/17 0415  BP: (!) 146/81 136/79  Pulse: 65 67  Resp: 13 16  Temp:    SpO2: 100% 100%   WDWN, resting comfortably PERRL Minimal motor function.  Neurological Awake, alert, oriented Memory and concentration grossly intact Speech fluent, appropriate CNII: Visual fields normal CNIII/IV/VI: EOMI CNV: Facial sensation normal CNVII: Symmetric, normal strength CNVIII: Grossly normal CNIX: Normal palate movement CNXI: Trap and SCM strength normal CN XII: Tongue protrusion normal  Skin Large palpable hematoma deep to surgical site Surgical site is healing well. No active bleeding/drainage.  No warmth or redness.  Results - Imaging/Labs   Results for orders placed or performed during  the hospital encounter of 04/11/17 (from the past 48 hour(s))  CBC with Differential     Status: Abnormal   Collection Time: 04/11/17 11:06 PM  Result Value Ref Range   WBC 18.0 (H) 4.0 - 10.5 K/uL   RBC 3.59 (L) 4.22 - 5.81 MIL/uL   Hemoglobin 10.9 (L) 13.0 - 17.0 g/dL   HCT 30.9 (L) 39.0 - 52.0 %   MCV 86.1 78.0 - 100.0 fL   MCH 30.4 26.0 - 34.0 pg   MCHC 35.3 30.0 - 36.0 g/dL   RDW 13.6 11.5 - 15.5 %   Platelets 183 150 - 400 K/uL   Neutrophils Relative % 92 %   Neutro Abs 16.5 (H) 1.7 - 7.7 K/uL   Lymphocytes Relative 6 %   Lymphs Abs 1.2 0.7 - 4.0 K/uL   Monocytes Relative 2 %   Monocytes Absolute 0.3 0.1 - 1.0 K/uL   Eosinophils Relative 0 %   Eosinophils Absolute 0.0 0.0 - 0.7 K/uL   Basophils Relative 0 %   Basophils Absolute 0.0 0.0 - 0.1 K/uL    Comment: Performed at [facility], [address] [place], [place] [address]  Comprehensive metabolic panel     Status: Abnormal   Collection Time: 04/11/17 11:06 PM  Result Value Ref Range   Sodium 135 135 - 145 mmol/L   Potassium 5.4 (H) 3.5 - 5.1 mmol/L   Chloride 107 101 - 111 mmol/L   CO2 19 (L) 22 - 32 mmol/L   Glucose, Bld 307 (H) 65 - 99 mg/dL   BUN 44 (H) 6 - 20 mg/dL   Creatinine, Ser 1.68 (H) 0.61 - 1.24 mg/dL   Calcium 8.2 (L) 8.9 - 10.3 mg/dL   Total Protein 5.6 (L) 6.5 - 8.1 g/dL   Albumin 2.2 (L) 3.5 - 5.0 g/dL   AST 24 15 - 41 U/L   ALT 68 (H) 17 - 63 U/L   Alkaline Phosphatase 60 38 - 126 U/L   Total Bilirubin 0.7 0.3 - 1.2 mg/dL   GFR calc non Af Amer 46 (L) >60 mL/min   GFR calc Af Amer 54 (L) >60 mL/min    Comment: (NOTE) The eGFR has been calculated using the CKD EPI equation. This calculation has not been validated in all clinical situations. eGFR's persistently <60 mL/min signify possible Chronic Kidney Disease.    Anion gap 9 5 - 15    Comment: Performed at [facility],  [address], [place], [place] [address]  Protime-INR     Status: Abnormal   Collection Time: 04/11/17  11:06 PM  Result Value Ref Range   Prothrombin Time 16.7 (H) 11.4 - 15.2 seconds   INR 1.37     Comment: Performed at [facility] [address] [place], [place] [address]  APTT     Status: None   Collection Time: 04/11/17 11:06 PM  Result Value Ref Range   aPTT 29 24 - 36 seconds    Comment: Performed at [facility] [address], [place], [place] [address]    Ct Soft Tissue Neck W Contrast  Result Date: 04/12/2017 CLINICAL DATA:  52 y/o M; bleeding from surgical wound on right neck. History of nose surgery and anterior cervical discectomy/fusion 03/25/2017. EXAM: CT NECK WITH CONTRAST TECHNIQUE: Multidetector CT imaging of the neck was performed using the standard protocol following the bolus administration of intravenous contrast. CONTRAST:  32m ISOVUE-300 IOPAMIDOL (ISOVUE-300) INJECTION 61% COMPARISON:  04/01/2017 MRI of the cervical spine. FINDINGS: Pharynx and larynx: Normal. No mass or swelling. Salivary glands: No inflammation, mass, or stone. Thyroid: Normal. Lymph nodes: None enlarged or abnormal density. Vascular: Negative. Limited intracranial: Negative. Visualized orbits: Negative. Mastoids and visualized paranasal sinuses: Clear. Skeleton: C5-C7 anterior cervical fusion and C6 corpectomy with prosthesis. There is a large prevertebral hematoma thickening the prevertebral space extending from C5 to below the field of view in the right anterior and right anterolateral prevertebral space. The hematoma measures up to 25 mm in thickness at the T1 level which is similar to the prior cervical MRI given differences in technique. Additionally, there is soft tissue attenuation masslike lesion extending from right of the thyroid gland, anterior to the sternocleidomastoid muscle, and into the right anterior cervical triangle likely representing a contiguous region of hematoma measuring 4.6 x 4.2 x 5.7 cm (AP x ML x CC series 3, image 53 and series 9, image 36). This area was not  included on the prior cervical MRI due to saturation banding and technique. The right anterior cervical triangle area of hematoma is contiguous with a defect in the skin likely representing the incision site in the right lateral neck (series 3, image 52). Upper chest: Clear lungs. Other: Negative. IMPRESSION: 1. Large prevertebral hematoma extending from C5 to below field of view in the right anterior and lateral prevertebral space, grossly stable from prior cervical MRI given differences in technique. 2. Contiguous area of hematoma extending right lateral to thyroid gland, anterior to right sternocleidomastoid muscle, to right anterior cervical triangle. This likely represents the surgical approach. The hematoma extends to the surgical incision in the right anterior neck. This area of hemorrhage was excluded from the field of view on prior cervical MRI, and is of uncertain stability. Electronically Signed   By: LKristine GarbeM.D.   On: 04/12/2017 02:47    Impression/Plan   52y.o. male with large prevertebral hematoma. While the hematoma is not compromising his airway presently and he is having no difficulties with swallowing, he reports the swelling has been progressively worsening over the past 24 hours. I believe due to the acuteness of this hematoma, it's in his best interest to monitor the area for worsening over the course of the day. Rec keeping NPO presently. Will get swallow screen just for reassurance.   Addendum Attending has seen patient. Still no airway compromise/dysphagia. Remove NPO order. Start carb modified diet. Likely d/c later today/tomorrow depending on how  he does today

## 2017-04-12 NOTE — Progress Notes (Signed)
Inpatient Diabetes Program Recommendations  AACE/ADA: New Consensus Statement on Inpatient Glycemic Control (2015)  Target Ranges:  Prepandial:   less than 140 mg/dL      Peak postprandial:   less than 180 mg/dL (1-2 hours)      Critically ill patients:  140 - 180 mg/dL   Lab Results  Component Value Date   GLUCAP 249 (H) 04/12/2017   HGBA1C 8.4 (H) 03/22/2017    Review of Glycemic Control Results for ESWIN, TELLINGHUISEN (MRN [id number]) as of 04/12/2017 12:21  Ref. Range 04/12/2017 08:51 04/12/2017 11:15  Glucose-Capillary Latest Ref Range: 65 - 99 mg/dL 627 (H) 035 (H)   Diabetes history: Type 2 DM Outpatient Diabetes medications: Novolog 0-20 Units TID, Novolog 0-5 Units QHS, Novolog 6 units TID, Lantus 60 units QHS Current orders for Inpatient glycemic control: Novolog 0-15 Units TID, Novolog 0-5 units QHS, Lantus 60 units QHS.  Inpatient Diabetes Program Recommendations:    Please consider increasing correction to Novolog 0-20 units TID and adding Novolog 6 units TIDAC (assuming patient eats >50%) (regimen from previous admission on 04/08/17).  Thanks, Lujean Rave, MSN, RNC-OB Diabetes Coordinator [phone] (8a-5p)

## 2017-04-12 NOTE — ED Notes (Signed)
Attempted to call report

## 2017-04-13 ENCOUNTER — Ambulatory Visit (INDEPENDENT_AMBULATORY_CARE_PROVIDER_SITE_OTHER): Payer: Self-pay | Admitting: Physician Assistant

## 2017-04-13 LAB — GLUCOSE, CAPILLARY
GLUCOSE-CAPILLARY: 243 mg/dL — AB (ref 65–99)
Glucose-Capillary: 243 mg/dL — ABNORMAL HIGH (ref 65–99)

## 2017-04-13 MED ORDER — HEPARIN SOD (PORK) LOCK FLUSH 100 UNIT/ML IV SOLN
250.0000 [IU] | INTRAVENOUS | Status: AC | PRN
  Administered 2017-04-13: 250 [IU]

## 2017-04-13 NOTE — Progress Notes (Signed)
Discharge to: [facility]) Anticipated discharge date: 04/13/17 Transportation by: [facility]  Report #: [phone], Room 124B  CSW signing off.  Blenda Nicely LCSW [phone]

## 2017-04-13 NOTE — Discharge Summary (Signed)
Physician Discharge Summary  Patient ID: Dylan [name] MRN: [id number] DOB/AGE: 1965-12-18 52 y.o.  Admit date: 04/11/2017 Discharge date: 04/13/2017  Admission Diagnoses:  Hematoma  Discharge Diagnoses:  Same Active Problems:   Hematoma  Discharged Condition: Stable  Hospital Course:  Dylan [name] is a 52 y.o. male who presented to ER yesterday due to bleeding from surgical site and hematoma under surgical site. No dysphagia or airway compromise. He was admitted for observation. On the morning of discharge, he reports feeling well. Continues to have no dysphagia or airway compromise. No further bleeding from surgical site. Cleared to be discharged back to SNF.  Treatments: Surgery - None  Discharge Exam: Blood pressure 130/68, pulse 65, temperature 98.7 F (37.1 C), temperature source Oral, resp. rate 19, height '6\' 4"'$  (1.93 m), weight 132.3 kg (291 lb 10.7 oz), SpO2 100 %. Awake, alert, oriented Speech fluent, appropriate. No stridor. Limited motor function Incision: healing well. Palpable hematoma unchanged. No active bleeding/drainage.  Disposition: 03-Skilled Nursing Facility  Discharge Instructions    Call MD for:  difficulty breathing, headache or visual disturbances   Complete by:  As directed    Call MD for:  persistant dizziness or light-headedness   Complete by:  As directed    Call MD for:  redness, tenderness, or signs of infection (pain, swelling, redness, odor or green/yellow discharge around incision site)   Complete by:  As directed    Call MD for:  severe uncontrolled pain   Complete by:  As directed    Call MD for:  temperature >100.4   Complete by:  As directed    Diet general   Complete by:  As directed    Driving Restrictions   Complete by:  As directed    Do not drive until given clearance.   Increase activity slowly   Complete by:  As directed    Lifting restrictions   Complete by:  As directed    Do not lift anything >10lbs. Avoid  bending and twisting in awkward positions. Avoid bending at the back.   May shower / Bathe   Complete by:  As directed    In 24 hours. Okay to wash wound with warm soapy water. Avoid scrubbing the wound. Pat dry.     Allergies as of 04/13/2017   No Known Allergies     Medication List    TAKE these medications   acetaminophen 325 MG tablet Commonly known as:  TYLENOL Take 2 tablets (650 mg total) by mouth every 6 (six) hours as needed for mild pain (or Fever >/= 101).   amiodarone 200 MG tablet Commonly known as:  PACERONE Take 1 tablet (200 mg total) by mouth daily.   b complex vitamins tablet Take 1 tablet by mouth daily.   bisacodyl 5 MG EC tablet Commonly known as:  DULCOLAX Take 1 tablet (5 mg total) by mouth daily as needed for moderate constipation.   ceFEPIme 2 g in sodium chloride 0.9 % 100 mL Inject 2 g into the vein every 12 (twelve) hours.   ceFEPime IVPB Commonly known as:  MAXIPIME Inject 2 g into the vein every 12 (twelve) hours. Indication:  Spinal abscess Last Day of Therapy:  05/16/2017 Labs - Once weekly:  CBC/D and BMP, Labs - Every other week:  ESR and CRP   dexamethasone 1.5 MG tablet Commonly known as:  DECADRON Take 2 tablets (3 mg total) by mouth every 8 (eight) hours.   diltiazem 30 MG tablet  Commonly known as:  CARDIZEM Take 1 tablet (30 mg total) by mouth every 8 (eight) hours.   feeding supplement (GLUCERNA SHAKE) Liqd Take 237 mLs by mouth 3 (three) times daily between meals.   insulin aspart 100 UNIT/ML injection Commonly known as:  novoLOG Inject 0-20 Units into the skin 3 (three) times daily with meals. What changed:  Another medication with the same name was changed. Make sure you understand how and when to take each.   insulin aspart 100 UNIT/ML injection Commonly known as:  novoLOG Inject 0-5 Units into the skin at bedtime. What changed:  Another medication with the same name was changed. Make sure you understand how and when  to take each.   insulin aspart 100 UNIT/ML injection Commonly known as:  novoLOG Inject 6 Units into the skin 3 (three) times daily with meals. What changed:  additional instructions   insulin glargine 100 UNIT/ML injection Commonly known as:  LANTUS Inject 0.6 mLs (60 Units total) into the skin at bedtime.   methocarbamol 500 MG tablet Commonly known as:  ROBAXIN Take 1 tablet (500 mg total) by mouth every 6 (six) hours as needed for muscle spasms.   ondansetron 4 MG tablet Commonly known as:  ZOFRAN Take 1 tablet (4 mg total) by mouth every 6 (six) hours as needed for nausea.   pantoprazole 40 MG tablet Commonly known as:  PROTONIX Take 1 tablet (40 mg total) by mouth daily.   senna-docusate 8.6-50 MG tablet Commonly known as:  Senokot-S Take 1 tablet by mouth at bedtime as needed for mild constipation.   sodium chloride 0.9 % infusion Inject 10 mLs into the vein continuous.   traZODone 50 MG tablet Commonly known as:  DESYREL Take 1 tablet (50 mg total) by mouth at bedtime as needed for sleep.   vancomycin 500 mg in sodium chloride 0.9 % 100 mL Inject 500 mg into the vein every 12 (twelve) hours.   vancomycin IVPB Inject 500 mg into the vein every 12 (twelve) hours. Indication:  Spinal abscess Last Day of Therapy:  05/16/2017 Labs - Sunday/Monday:  CBC/D, BMP, and vancomycin trough. Labs - Thursday:  BMP and vancomycin trough Labs - Every other week:  ESR and CRP      Follow-up Information    Consuella Lose, MD Follow up.   Specialty:  Neurosurgery Why:  follow up as originially scheduled Contact information: [address] [place] [address] [place] [address] [phone]           Signed: Traci Sermon 04/13/2017, 7:38 AM

## 2017-04-13 NOTE — Care Management Note (Signed)
Case Management Note  Patient Details  Name: Dylan [name] MRN: [id number] Date of Birth: 06-14-1965  Subjective/Objective:                    Action/Plan: Pt returning to [facility] today. No further needs per CM.   Expected Discharge Date:  04/13/17               Expected Discharge Plan:  Skilled Nursing Facility  In-House Referral:  Clinical Social Work  Discharge planning Services     Post Acute Care Choice:    Choice offered to:     DME Arranged:    DME Agency:     HH Arranged:    HH Agency:     Status of Service:  Completed, signed off  If discussed at [employer], dates discussed:    Additional Comments:  Kermit Balo, RN 04/13/2017, 10:47 AM

## 2017-04-13 NOTE — Progress Notes (Signed)
CSW following for discharge plan. Per chart review, patient is a recent readmission (see last assessment below completed 03/30/17). Patient had been discharged to [facility] under [place]. CSW met with patient to discuss plan to return to [place]. Patient admitted that when he went there, he didn't actually think he was going to like it, but that the therapists got him up and moving from day one and the food was pretty good. Patient said he's excited to be able to go back and continue working.  CSW contacted Admissions at [facility] to confirm that patient could return when stable, and would not need a new LOG letter. CSW will continue to follow.  Laveda Abbe, [place] Clinical Social Worker [phone]     Clinical Social Work Assessment  Patient Details  Name: Dylan [name] MRN: [id number] Date of Birth: 1966/02/02  Date of referral:  03/30/17               Reason for consult:  Facility Placement                 Permission sought to share information with:  Facility Art therapist granted to share information::  Yes, Verbal Permission Granted             Name::                   Agency::  SNFs             Relationship::                Contact Information:     Housing/Transportation Living arrangements for the past 2 months:  Apartment Source of Information:  Patient Patient Interpreter Needed:  None Criminal Activity/Legal Involvement Pertinent to Current Situation/Hospitalization:  No - Comment as needed Significant Relationships:  Siblings, Other Family Members Lives with:  Self Do you feel safe going back to the place where you live?  No Need for family participation in patient care:  No (Coment)  Care giving concerns: Patient from home independently, now requiring max assist with mobility. PT recommending CIR.  Social Worker assessment / plan: CSW met with patient at bedside. Patient alert and oriented, though lethargic. CSW discussed disposition  planning. CIR unable to offer a bed to patient now, as patient unable to tolerate CIR level of therapy.   Patient lives alone in a second floor apartment and does not have assistance from family. Patient would not be able to safely return home in current condition.  Patient is agreeable to SNF. However, patient does not have any insurance coverage; financial counseling assisting. CSW consulting with CSW supervisor to determine disposition plan; 30 day LOG approved, based on Medicaid eligibility. CSW beginning SNF bed search for patient, sent out initial referrals. CSW to follow with financial counseling re: Medicaid application and will follow up with facilities for bed offers.  Employment status:  Disabled (Comment on whether or not currently receiving Disability) Insurance information:  Self Pay (Medicaid Pending) PT Recommendations:  Inpatient Rehab Consult Information / Referral to community resources:  [place]  Patient/Family's Response to care: Patient appreciative of care.  Patient/Family's Understanding of and Emotional Response to Diagnosis, Current Treatment, and Prognosis: Patient with understanding of his conditions and need for rehab.   Emotional Assessment Appearance:  Appears stated age Attitude/Demeanor/Rapport:  Engaged, Lethargic Affect (typically observed):  Calm, Pleasant Orientation:  Oriented to Self, Oriented to Place, Oriented to  Time, Oriented to Situation Alcohol / Substance use:  Not Applicable  Psych involvement (Current and /or in the community):  No (Comment)  Discharge Needs  Concerns to be addressed:  Discharge Planning Concerns, Care Coordination Readmission within the last 30 days:  No Current discharge risk:  Physical Impairment, Dependent with Mobility Barriers to Discharge:  Continued Medical Work up, No SNF bed, Inadequate or no insurance   Estanislado Emms, LCSW 03/30/2017, 2:58 PM

## 2017-04-13 NOTE — Progress Notes (Signed)
Pt being discharged from hospital per orders from MD. Pt educated on discharge instructions. Pt verbalized understanding of instructions. All questions and concerns were addressed. Pt's PICC line was flushed and capped prior to discharge. Pt exited hospital via stretcher accompanied by transport staff. Attempted call to [facility] to give report but nurse was not available. Left work phone number to have nurse call back for report.

## 2017-04-14 ENCOUNTER — Non-Acute Institutional Stay (SKILLED_NURSING_FACILITY): Payer: Medicaid Other | Admitting: Adult Health

## 2017-04-14 ENCOUNTER — Encounter: Payer: Self-pay | Admitting: Adult Health

## 2017-04-14 DIAGNOSIS — IMO0002 Reserved for concepts with insufficient information to code with codable children: Secondary | ICD-10-CM

## 2017-04-14 DIAGNOSIS — I481 Persistent atrial fibrillation: Secondary | ICD-10-CM

## 2017-04-14 DIAGNOSIS — I10 Essential (primary) hypertension: Secondary | ICD-10-CM | POA: Diagnosis not present

## 2017-04-14 DIAGNOSIS — Z794 Long term (current) use of insulin: Secondary | ICD-10-CM | POA: Diagnosis not present

## 2017-04-14 DIAGNOSIS — K219 Gastro-esophageal reflux disease without esophagitis: Secondary | ICD-10-CM

## 2017-04-14 DIAGNOSIS — M4800 Spinal stenosis, site unspecified: Secondary | ICD-10-CM | POA: Diagnosis not present

## 2017-04-14 DIAGNOSIS — E1165 Type 2 diabetes mellitus with hyperglycemia: Secondary | ICD-10-CM | POA: Diagnosis not present

## 2017-04-14 DIAGNOSIS — I4819 Other persistent atrial fibrillation: Secondary | ICD-10-CM

## 2017-04-14 NOTE — Progress Notes (Signed)
Location:   [facility] Room Number: [place] of Service:  SNF (31)   CODE STATUS: Full Code  No Known Allergies  Chief Complaint  Patient presents with  . Hospitalization Follow-up    Hospital Follow up    HPI:  He is a 52 year old who had been having increasing bilateral lower extremity weakness over the past several months. His weakness was worse on his right side and onto his left side. He had a fall at home and could not get himself up. 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 He underwent a cervical cord decompression on 03-25-17. He had complications including new onset afib with rvr now on amiodarone. He is not on anticoagulation as he developed a right neck hematoma. He developed SIRS vs sepsis with no clear source with high suspicion related to fluid collection on cervical; spine or complicated UTI. He was discharged to this facility on 04-09-17 and did return to the ED for a right neck hematoma.  He is here for short term rehab with his goal to return back home. He does continue to have weakness present; is unable to move lower extremities; has limited range of motion in the upper extremities. He denies any uncontrolled pain; no difficulty breathing; no difficulty swallowing. There are no nursing concerns at this time.  He will continue to be followed for his chronic illnesses including: afib; hypertension; diabetes.    Past Medical History:  Diagnosis Date  . 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 Diabetes mellitus without complication ([place])   . Hypertension   . MS (congenital mitral stenosis) 2019  . Persistent atrial fibrillation with rapid ventricular response ([place]) 03/27/2017    Past Surgical History:  Procedure Laterality Date  . ANTERIOR CERVICAL DECOMP/DISCECTOMY FUSION N/A 03/25/2017   Procedure: ANTERIOR CERVICAL DECOMPRESSION/DISCECTOMY  FUSION CERVICAL FIVE- CERVICAL SIX, CERVICAL SIX- CERVICAL SEVEN, CORPECTOMY;  Surgeon: Consuella Lose, MD;  Location: [facility];  Service: Neurosurgery;  Laterality: N/A;  . NOSE SURGERY     AS A CHILD    Social History   Socioeconomic History  . Marital status: Single    Spouse name: Not on file  . Number of children: Not on file  . Years of education: Not on file  . Highest education level: Not on file  Social Needs  . Financial resource strain: Not on file  . Food insecurity - worry: Not on file  . Food insecurity - inability: Not on file  . Transportation needs - medical: Not on file  . Transportation needs - non-medical: Not on file  Occupational History  . Not on file  Tobacco Use  . Smoking status: Never Smoker  . Smokeless tobacco: Never Used  Substance and Sexual Activity  . Alcohol use: No    Frequency: Never  . Drug use: No  . Sexual activity: Not on file  Other Topics Concern  . Not on file  Social History Narrative  . Not on file   Family History  Problem Relation Age of Onset  . Prostate cancer Father       VITAL SIGNS BP 138/78   Pulse 70   Temp 97.9 F (36.6 C)   Resp 16   Ht _0  (1.93 m)   Wt (!) 310 lb (140.6 kg)   SpO2 99%   BMI 37.73 kg/m   Outpatient Encounter Medications as of 04/14/2017  Medication Sig Note  . acetaminophen (TYLENOL) 325 MG tablet Take 2 tablets (650 mg total) by mouth every 6 (six) hours as needed for mild pain (or Fever >/= 101).   Marland Kitchen amiodarone (PACERONE) 200 MG tablet Take 1 tablet (200 mg total) by mouth daily.   Marland Kitchen b complex vitamins tablet Take 1 tablet by mouth daily.   . bisacodyl (DULCOLAX) 5 MG EC tablet Take 1 tablet (5 mg total) by mouth daily as needed for moderate constipation.   Marland Kitchen ceFEPime (MAXIPIME) IVPB Inject 2 g into the vein every 12 (twelve) hours. Indication:  Spinal abscess Last Day of Therapy:  05/16/2017 Labs - Once weekly:  CBC/D and BMP, Labs - Every other week:  ESR and CRP   . ceFEPIme  2 g in sodium chloride 0.9 % 100 mL Inject 2 g into the vein every 12 (twelve) hours.   Marland Kitchen dexamethasone (DECADRON) 1.5 MG tablet Take 2 tablets (3 mg total) by mouth every 8 (eight) hours.   Marland Kitchen diltiazem (CARDIZEM) 30 MG tablet Take 1 tablet (30 mg total) by mouth every 8 (eight) hours.   . insulin glargine (LANTUS) 100 UNIT/ML injection Inject 0.6 mLs (60 Units total) into the skin at bedtime.   . methocarbamol (ROBAXIN) 500 MG tablet Take 1 tablet (500 mg total) by mouth every 6 (six) hours as needed for muscle spasms.   . ondansetron (ZOFRAN) 4 MG tablet Take 1 tablet (4 mg total) by mouth every 6 (six) hours as needed for nausea.   . pantoprazole (PROTONIX) 40 MG tablet Take 1 tablet (40 mg total) by mouth daily.   Marland Kitchen senna-docusate (SENOKOT-S) 8.6-50 MG tablet Take 1 tablet by mouth at bedtime as needed for mild constipation.   . traZODone (DESYREL) 50 MG tablet Take 1 tablet (50 mg total) by mouth at bedtime as needed for sleep.   . vancomycin 500 mg in sodium chloride 0.9 % 100 mL Inject 500 mg into the vein every 12 (twelve) hours.   . vancomycin IVPB Inject 500 mg into the vein every 12 (twelve) hours. Indication:  Spinal abscess Last Day of Therapy:  05/16/2017 Labs - Sunday/Monday:  CBC/D, BMP, and vancomycin trough. Labs - Thursday:  BMP and vancomycin trough Labs - Every other week:  ESR and CRP   . feeding supplement, GLUCERNA SHAKE, (GLUCERNA SHAKE) LIQD Take 237 mLs by mouth 3 (three) times daily between meals.   . insulin aspart (NOVOLOG) 100 UNIT/ML injection Inject 0-20 Units into the skin 3 (three) times daily with meals. (Patient not taking: Reported on 04/11/2017)   . insulin aspart (NOVOLOG) 100 UNIT/ML injection Inject 0-5 Units into the skin at bedtime. (Patient not taking: Reported on 04/11/2017)   . insulin aspart (NOVOLOG) 100 UNIT/ML injection Inject 6 Units into the skin 3 (three) times daily with meals. (Patient taking differently: Inject 6 Units into the skin 3 (three)  times daily with meals. Sliding Scale If Blood sugar 0-120 =0 units call provider if less than 60 121-150= 2 units 151-200=4 units 201-250 =6 units 251-300 = 8 units 301-350 = 10 units 351-400=12 units 401-450=14 units Call provider if if Greater then 450) 04/11/2017: Blood sugar was 354 at 16:30 gave 12 units  . sodium chloride 0.9 % infusion Inject 10 mLs into the vein continuous.    No facility-administered encounter medications on file as of 04/14/2017.      SIGNIFICANT DIAGNOSTIC EXAMS  TODAY:  03-21-17: chest x-ray: No active cardiopulmonary disease.   03-21-17: lumbar  spine MRI: 1. Diffusely heterogeneous marrow signal throughout the visualized thoracolumbar spine, pelvis, and lower thoracic ribs, concerning for multiple myeloma or metastatic disease. 2. Patchy, abnormal signal throughout the visualized lower thoracic spinal cord, nonspecific. Recommend further evaluation with MRI of the cervical and thoracic spine with and without contrast. 3. Multilevel degenerative changes throughout the lumbar spine with severe central spinal canal stenosis at L2-L3 and L3-L4, likely accounting for the patient's symptoms. 4. Severe right neuroforaminal stenosis at L5-S1. Moderate left neuroforaminal stenosis at L3-L4.  03-21-17: MRI cervical and thoracic spine: 1. Scattered T2 hyperintense lesions throughout the cervical and thoracic spinal cord as well as in the pons, medulla, and cerebellum. A lesion at C6-7 is enhancing. While some lesions are associated with spinal stenosis and could in isolation reflect spondylotic myelopathy, the presence of lesions elsewhere in the cord and posterior fossa are indicative of a more widespread process such as demyelinating disease. Infection, inflammatory conditions such as sarcoidosis, and metastatic disease are additional considerations. Brain MRI and CSF analysis are recommended. 2. Cervical disc degeneration with severe spinal stenosis at C6-7 and moderate  spinal stenosis at C5-6. 3. Mild spinal stenosis at T6-7 and T7-8 due to disc and facet degeneration. 4. Diffusely abnormal bone marrow signal. Considerations include multiple myeloma, metastatic disease, and other infiltrative/myelofibrotic marrow processes.  03-23-17: MRI of brain: 1. At least 5 infratentorial and greater than 10 supratentorial lesions consistent with chronic demyelination, with acute to subacute enhancing lesions bilateral occipital lobes. Subcentimeter LEFT thalamus and RIGHT basal ganglia demyelinating plaques. 2. Probable LEFT optic neuritis. 3. Mild parenchymal brain volume loss for age.  03-30-17: 2-d echo: - Left ventricle: The cavity size was normal. There was mild concentric hypertrophy. Systolic function was normal. The estimated ejection fraction was in the range of 50% to 55%. Although no diagnostic regional wall motion abnormality was identified, this possibility cannot be completely excluded on the basis of this study. Doppler parameters are consistent with abnormal left ventricular relaxation (grade 1 diastolic dysfunction). - Left atrium: The atrium was mildly dilated.   03-31-17: ct of head: 1. Scattered cerebral white-matter hypodensities in a distribution most consistent with underlying demyelinating disease, grossly stable relative to recent MRI. 2. No other new intracranial abnormality identified.  04-01-17: chest x-ray: Probable subsegmental atelectasis at the right lung base.  No CHF. Soft tissue fullness in the right paratracheal region is more conspicuous than on the previous study. This may reflect lymphadenopathy, intrathoracic border, or vascular structure. Given the patient's lower anterior cervical fusion 1 week ago, hematoma or infection may be present. Contrast-enhanced CT scanning of the neck and chest is recommended.  04-01-17: MRI of cervical spine: 1. New spinal cord compression and suspected cord edema from the C3 level through the C6 level appears  related to material in the ventral epidural space which is favored to be hematoma (see #2). New holo-cord edema from the C3 to the C6 cord levels, superimposed on several preexisting demyelinating spinal cord lesions. 2. Large prevertebral fluid collection tracking from C4 into the upper thoracic spine, and dissecting through the right lateral neck likely along the surgical approach. In the upper thorax the collection continues and tracks into the right lateral paraspinal space. The fluid is dark on all sequences with mild rim enhancement, and favored to be a large hematoma with estimated volume exceeding 100 mL. 3. Associated regional mass effect on the neck and thoracic inlet soft tissues. No significant airway narrowing is demonstrated. 4. Postoperative changes C5 through C7  including partial C6 corpectomy.  04-12-17: ct of neck soft tissue: 1. Large prevertebral hematoma extending from C5 to below field of view in the right anterior and lateral prevertebral space, grossly stable from prior cervical MRI given differences in technique. 2. Contiguous area of hematoma extending right lateral to thyroid gland, anterior to right sternocleidomastoid muscle, to right anterior cervical triangle. This likely represents the surgical approach. The hematoma extends to the surgical incision in the right anterior neck. This area of hemorrhage was excluded from the field of view on prior cervical MRI, and is of uncertain stability.    LABS REVIEWED: TODAY:   03-21-17: wbc 8.2; hgb 13.9; hct 39.5; mcv 85.5; plt 258; glucose 176; bun 9; creat 0.84; k+ 3.9; na++ 140; ca 9.4; liver normal albumin 4.1; HIV/RPR: nr 03-22-17: hgb a1c 8.4 03-27-17: glucose 266; bun 24; creat 1.03 ;k+ 4.1; na++ 138; ca 8.9; tsh 0.725; Free T4: 1.03 03-28-17: wbc 13.2; hgb 15.5; hct 43.8; mcv 86.7; plt 208 04-01-17: urine culture: e-coli 04-02-17: wbc 27.0; hgb 11.5; hct 33.3; mcv 87.2; plt 216; glucose 236; bun 49; creat 1.45; k+ 4.0; na++  144; ca 8.1; liver normal albumin 2.1  04-06-17: glucose 263; bun 45; creat 1.56; k+ 4.9; na++ 135; ca 7.9; liver normal albumin 1.8 04-11-17: wbc 18.0; hgb 10.9; hct 30.9; mcv 86.1; plt 183; glucose 307; bun 44; creat 1.68; k+ 5.4; na++ 135; liver normal albumin 2.2     Review of Systems  Constitutional: Negative for malaise/fatigue.  Respiratory: Negative for cough and shortness of breath.   Cardiovascular: Negative for chest pain, palpitations and leg swelling.  Gastrointestinal: Negative for abdominal pain, constipation and heartburn.  Musculoskeletal: Negative for back pain and myalgias.  Skin: Negative.        Has hematoma on right neck   Neurological: Negative for dizziness.  Psychiatric/Behavioral: The patient is not nervous/anxious.     Physical Exam  Constitutional: He is oriented to person, place, and time. He appears well-developed and well-nourished. No distress.  Obese   Neck: No tracheal deviation present. No thyromegaly present.  Hematoma right neck  Cardiovascular: Normal rate, regular rhythm and intact distal pulses.  Murmur heard. 1/6  Pulmonary/Chest: Effort normal and breath sounds normal. No respiratory distress.  Abdominal: Soft. Bowel sounds are normal. He exhibits no distension.  Musculoskeletal:  Is quadriplegic Is able to move upper extremities Has 2+ bilateral lower extremity edema   Lymphadenopathy:    He has no cervical adenopathy.  Neurological: He is alert and oriented to person, place, and time.  Skin: Skin is warm and dry. He is not diaphoretic.  Incision line without bleeding or signs of infection present   Psychiatric: He has a normal mood and affect.     ASSESSMENT/ PLAN:  TODAY:  1. Essential hypertension: stable b/p 138/78 is currently stable; will not make changes and will monitor his status.   2. Persistent atrial fibrillation with rapid ventricular response: heart rate stable: will continue amiodarone 200 mg daily and cardizem 30 mg  every 8 hours. Is not on anticoagulation due to hematoma   3. gerd without esophagitis: stable will continue protonix 40 mg daily   4. Insulin dependent type 2 diabetes mellitus uncontrolled: stable hgb a1c 8.4; will continue lantus 60 units nightly; novolog 6 units with meals with an SSI: will monitor  5. Spinal stenosis multiple sites in spine: is status post cervical spine decompression: is stable: will continue decadron 3 mg every 8 hours; has robaxin 500 mg  every 6 hours as needed will continue therapy as directed to improve upon his level of independence and ability to perform adls. Will monitor  6.  Spinal abscess: is stable will continue cefepime 2 gm every 12 hours with vancomycin 500 mg twice daily through 05-16-17. Pharmacy will dose vancomycin. Will continue weekly blood work and will follow up with I/D as directed.   7. Multiple sclerosis exacerbation: without change in status. Will continue to be followed by neurology. Will continue decadron 3 mg every 8 hours and will monitor his status.    MD is aware of resident's narcotic use and is in agreement with current plan of care. We will attempt to wean resident as apropriate   Ok Edwards NP [facility]  Contact [phone] Monday through Friday 8am- 5pm  After hours call [phone]

## 2017-04-15 ENCOUNTER — Encounter: Payer: Self-pay | Admitting: Internal Medicine

## 2017-04-15 ENCOUNTER — Non-Acute Institutional Stay (SKILLED_NURSING_FACILITY): Payer: Medicaid Other | Admitting: Internal Medicine

## 2017-04-15 DIAGNOSIS — N3945 Continuous leakage: Secondary | ICD-10-CM

## 2017-04-15 DIAGNOSIS — K219 Gastro-esophageal reflux disease without esophagitis: Secondary | ICD-10-CM | POA: Insufficient documentation

## 2017-04-15 DIAGNOSIS — I10 Essential (primary) hypertension: Secondary | ICD-10-CM | POA: Diagnosis not present

## 2017-04-15 DIAGNOSIS — L89152 Pressure ulcer of sacral region, stage 2: Secondary | ICD-10-CM | POA: Diagnosis not present

## 2017-04-15 DIAGNOSIS — G8250 Quadriplegia, unspecified: Secondary | ICD-10-CM

## 2017-04-15 DIAGNOSIS — Z794 Long term (current) use of insulin: Secondary | ICD-10-CM | POA: Diagnosis not present

## 2017-04-15 DIAGNOSIS — Z9889 Other specified postprocedural states: Secondary | ICD-10-CM

## 2017-04-15 DIAGNOSIS — R1900 Intra-abdominal and pelvic swelling, mass and lump, unspecified site: Secondary | ICD-10-CM

## 2017-04-15 DIAGNOSIS — E1165 Type 2 diabetes mellitus with hyperglycemia: Secondary | ICD-10-CM | POA: Insufficient documentation

## 2017-04-15 DIAGNOSIS — IMO0002 Reserved for concepts with insufficient information to code with codable children: Secondary | ICD-10-CM | POA: Insufficient documentation

## 2017-04-15 DIAGNOSIS — E1169 Type 2 diabetes mellitus with other specified complication: Secondary | ICD-10-CM

## 2017-04-15 DIAGNOSIS — I480 Paroxysmal atrial fibrillation: Secondary | ICD-10-CM

## 2017-04-15 LAB — BASIC METABOLIC PANEL
BUN: 43 — AB (ref 4–21)
Creatinine: 1.6 — AB (ref 0.6–1.3)
GLUCOSE: 130
POTASSIUM: 5 (ref 3.4–5.3)
Sodium: 138 (ref 137–147)

## 2017-04-15 NOTE — Progress Notes (Signed)
Patient ID: Cam Hai, male   DOB: February 19, 1965, 52 y.o.   MRN: [id number]  Provider:  DR Arletha Grippe Location:  [facility] Number: [place] of Service:  SNF (31)  PCP: Patient, No Pcp Per Patient Care Team: Patient, No Pcp Per as PCP - General ([place]) Debara Pickett Nadean Corwin, MD as PCP - Cardiology (Cardiology)  Extended Emergency Contact Information Primary Emergency Contact: Don Perking,  Home Phone: [phone] Relation: None Secondary Emergency Contact: Foster Simpson Mobile Phone: [phone] Relation: Sister  Code Status: Full Code Goals of Care: Advanced Directive information Advanced Directives 04/15/2017  Does Patient Have a Medical Advance Directive? No  Type of Advance Directive -  Does patient want to make changes to medical advance directive? No - Patient declined  Copy of [place] in Chart? -  Would patient like information on creating a medical advance directive? No - Patient declined      Chief Complaint  Patient presents with  . New Admit To SNF    Admission    HPI: Patient is a 52 y.o. male seen today for admission to SNF following prolonged hospital stay for SIRS+/- sepsis, cervical cord compression at C5-C6, C6-C7, NEW MS, afib, DM, AKI. He presented to the ED with progressive weakness. 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 He was tx with IV steroids and spinal decompression by neurosx on 03/25/17. He developed new onset afib with RVR that req'd amio gtt-->po ami and diltiazem. MRI C spine revealed possible hematoma 03/25/17 and anticoagulation stopped. No evacuation of hematoma performed due to pt high risk. Source of SIRS thought to be related to hematoma as BC remained neg. UC (+) >100K colonies E coli and 50K enterobacter. He was tx with IV vanco and cefepime (STOP DATE 05/16/17). Cr 1.69; Hgb 10.7; WBC 14.8K at d/c. He was d/c'd to SNF. He then was sent back to the ED and  was admitted for observation on 04/11/17 due to c/a neck bleeding from hematoma. Surgery consulted but no sx recommended. Albumin 2.2; Cr 1.68; WBC 18K at d/c. He was d/c'd back to SNF for short term rehab.  Today he reports c/a LE weakness. CBGs 150-350s. No low BS reactions. No CP, SOB. He has difficulty walking. Tolerating PT.  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   DM - uncontrolled. A1c 8.4%.Marland Kitchen He takes insulin   Past Medical History:  Diagnosis Date  . 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 Diabetes mellitus without complication ([place])   . Hypertension   . MS (congenital mitral stenosis) 2019  . Persistent atrial fibrillation with rapid ventricular response ([place]) 03/27/2017   Past Surgical History:  Procedure Laterality Date  . ANTERIOR CERVICAL DECOMP/DISCECTOMY FUSION N/A 03/25/2017   Procedure: ANTERIOR CERVICAL DECOMPRESSION/DISCECTOMY FUSION CERVICAL FIVE- CERVICAL SIX, CERVICAL SIX- CERVICAL SEVEN, CORPECTOMY;  Surgeon: Consuella Lose, MD;  Location: [facility];  Service: Neurosurgery;  Laterality: N/A;  . NOSE SURGERY     AS A CHILD    reports that  has never smoked. he has never used smokeless tobacco. He reports that he does not drink alcohol or use drugs. Social History   Socioeconomic History  . Marital status: Single    Spouse name: Not on file  . Number of children: Not on file  . Years of education: Not on file  . Highest education level: Not on file  Social Needs  .  Financial resource strain: Not on file  . Food insecurity - worry: Not on file  . Food insecurity - inability: Not on file  . Transportation needs - medical: Not on file  . Transportation needs - non-medical: Not on file  Occupational History  . Not on file  Tobacco Use  . Smoking status: Never Smoker  . Smokeless tobacco: Never Used  Substance and Sexual Activity  . Alcohol use: No    Frequency: Never  . Drug use: No  . Sexual activity: Not on file  Other Topics Concern  . Not on  file  Social History Narrative  . Not on file    Functional Status Survey:    Family History  Problem Relation Age of Onset  . Prostate cancer Father     There are no preventive care reminders to display for this patient.  No Known Allergies  Outpatient Encounter Medications as of 04/15/2017  Medication Sig  . acetaminophen (TYLENOL) 325 MG tablet Take 650 mg by mouth every 4 (four) hours as needed.  Marland Kitchen amiodarone (PACERONE) 200 MG tablet Take 1 tablet (200 mg total) by mouth daily.  Marland Kitchen b complex vitamins tablet Take 1 tablet by mouth daily.  . bisacodyl (DULCOLAX) 5 MG EC tablet Take 1 tablet (5 mg total) by mouth daily as needed for moderate constipation.  Marland Kitchen ceFEPime (MAXIPIME) IVPB Inject 2 g into the vein every 12 (twelve) hours. Indication:  Spinal abscess Last Day of Therapy:  05/16/2017 Labs - Once weekly:  CBC/D and BMP, Labs - Every other week:  ESR and CRP  . ceFEPIme 2 g in sodium chloride 0.9 % 100 mL Inject 2 g into the vein every 12 (twelve) hours.  Marland Kitchen dexamethasone (DECADRON) 1.5 MG tablet Take 2 tablets (3 mg total) by mouth every 8 (eight) hours.  Marland Kitchen diltiazem (CARDIZEM) 30 MG tablet Take 1 tablet (30 mg total) by mouth every 8 (eight) hours.  Marland Kitchen GLUCERNA (GLUCERNA) LIQD Glucerna 1.5 - Give 240 ml three times daily  . insulin aspart (NOVOLOG FLEXPEN) 100 UNIT/ML FlexPen Inject as per sliding scale subcutaneously before meals: 0 - 120 = 0 Units 121 - 150 = 2 units 151 - 200 = 4 units 201 - 250 = 6 units 251 - 300 = 8 units 301 - 350 = 10 units 351 - 400 = 12 units 401 - 450 = 14 units Notify Provider is less than 60 or greater than 450  . insulin glargine (LANTUS) 100 UNIT/ML injection Inject 0.6 mLs (60 Units total) into the skin at bedtime.  . methocarbamol (ROBAXIN) 500 MG tablet Take 1 tablet (500 mg total) by mouth every 6 (six) hours as needed for muscle spasms.  . ondansetron (ZOFRAN) 4 MG tablet Take 1 tablet (4 mg total) by mouth every 6 (six) hours as  needed for nausea.  . pantoprazole (PROTONIX) 40 MG tablet Take 1 tablet (40 mg total) by mouth daily.  Marland Kitchen senna-docusate (SENOKOT-S) 8.6-50 MG tablet Take 1 tablet by mouth at bedtime as needed for mild constipation.  . sodium chloride 0.9 % injection Inject 10 mLs into the vein 2 (two) times daily. Before and after antibiotics  . traZODone (DESYREL) 50 MG tablet Take 1 tablet (50 mg total) by mouth at bedtime as needed for sleep.  . vancomycin IVPB Inject 500 mg into the vein every 12 (twelve) hours. Indication:  Spinal abscess Last Day of Therapy:  05/16/2017 Labs - Sunday/Monday:  CBC/D, BMP, and vancomycin trough. Labs -  Thursday:  BMP and vancomycin trough Labs - Every other week:  ESR and CRP  . [DISCONTINUED] acetaminophen (TYLENOL) 325 MG tablet Take 2 tablets (650 mg total) by mouth every 6 (six) hours as needed for mild pain (or Fever >/= 101). (Patient not taking: Reported on 04/15/2017)  . [DISCONTINUED] feeding supplement, GLUCERNA SHAKE, (GLUCERNA SHAKE) LIQD Take 237 mLs by mouth 3 (three) times daily between meals. (Patient not taking: Reported on 04/15/2017)  . [DISCONTINUED] insulin aspart (NOVOLOG) 100 UNIT/ML injection Inject 0-20 Units into the skin 3 (three) times daily with meals. (Patient not taking: Reported on 04/11/2017)  . [DISCONTINUED] insulin aspart (NOVOLOG) 100 UNIT/ML injection Inject 0-5 Units into the skin at bedtime. (Patient not taking: Reported on 04/11/2017)  . [DISCONTINUED] insulin aspart (NOVOLOG) 100 UNIT/ML injection Inject 6 Units into the skin 3 (three) times daily with meals. (Patient not taking: Reported on 04/15/2017)  . [DISCONTINUED] sodium chloride 0.9 % infusion Inject 10 mLs into the vein continuous. (Patient not taking: Reported on 04/15/2017)  . [DISCONTINUED] vancomycin 500 mg in sodium chloride 0.9 % 100 mL Inject 500 mg into the vein every 12 (twelve) hours. (Patient not taking: Reported on 04/15/2017)   No facility-administered encounter  medications on file as of 04/15/2017.     Review of Systems  Genitourinary:       Urinary incontinence  Musculoskeletal: Positive for gait problem.  Neurological: Positive for weakness.  All other systems reviewed and are negative.   Vitals:   04/15/17 1020  BP: (!) 146/78  Pulse: 78  Resp: 18  Temp: 98.2 F (36.8 C)  SpO2: 98%  Weight: (!) 310 lb (140.6 kg)  Height: _0  (1.93 m)   Body mass index is 37.73 kg/m. Physical Exam  Constitutional: He is oriented to person, place, and time. He appears well-developed and well-nourished.  Lying in bed in NAD  HENT:  Mouth/Throat: Oropharynx is clear and moist.  MMM; no oral thrush  Eyes: Pupils are equal, round, and reactive to light. No scleral icterus.  Neck: Neck supple. Carotid bruit is not present. No thyromegaly present.  Right neck hematoma, present; no active bleed but dried blood noted  Cardiovascular: Normal rate, regular rhythm and intact distal pulses. Exam reveals no gallop and no friction rub.  Murmur (1/6 SEM) heard. BLE edema pitting; no calf TTP  Pulmonary/Chest: Effort normal and breath sounds normal. He has no wheezes. He has no rales. He exhibits no tenderness.  Abdominal: Soft. Normal appearance and bowel sounds are normal. He exhibits mass (mid abdomen, large, NT). He exhibits no distension, no abdominal bruit and no pulsatile midline mass. There is no hepatomegaly. There is no tenderness. There is no rigidity, no rebound and no guarding. No hernia.  Genitourinary: Penis normal.  Musculoskeletal: He exhibits edema.  Lymphadenopathy:    He has no cervical adenopathy.  Neurological: He is alert and oriented to person, place, and time.  paraplegic  Skin: Skin is warm and dry. No rash noted.  Stage 2 sacral decub - followed by facility wound care; no purulent d/c  Psychiatric: He has a normal mood and affect. His behavior is normal. Judgment and thought content normal.    Labs reviewed: Basic Metabolic  Panel: Recent Labs    04/07/17 0520 04/08/17 1222  04/09/17 0130 04/09/17 1049 04/11/17 2306  NA 132* 134*  --   --  134* 135  K 5.2* 6.0*   < > 5.0 5.1 5.4*  CL 106 109  --   --  107 107  CO2 18* 16*  --   --  18* 19*  GLUCOSE 231* 189*  --   --  203* 307*  BUN 45* 49*  --   --  49* 44*  CREATININE 1.68* 1.75*  --   --  1.69* 1.68*  CALCIUM 8.0* 8.0*  --   --  8.2* 8.2*  MG 2.3  --   --   --   --   --    < > = values in this interval not displayed.   Liver Function Tests: Recent Labs    04/06/17 0500 04/07/17 0520 04/11/17 2306  AST 40 27 24  ALT 139* 123* 68*  ALKPHOS 48 52 60  BILITOT 0.7 0.7 0.7  PROT 5.2* 5.3* 5.6*  ALBUMIN 1.8* 2.0* 2.2*   No results for input(s): LIPASE, AMYLASE in the last 8760 hours. No results for input(s): AMMONIA in the last 8760 hours. CBC: Recent Labs    03/22/17 0152  04/02/17 0403  04/08/17 0503 04/09/17 1049 04/11/17 2306  WBC 7.5   < > 27.0*   < > 16.8* 14.8* 18.0*  NEUTROABS 4.5  --  22.9*  --   --   --  16.5*  HGB 13.4   < > 11.5*   < > 10.1* 10.7* 10.9*  HCT 38.6*   < > 33.3*   < > 29.0* 30.2* 30.9*  MCV 87.1   < > 87.2   < > 84.5 85.3 86.1  PLT 238   < > 216   < > 207 200 183   < > = values in this interval not displayed.   Cardiac Enzymes: No results for input(s): CKTOTAL, CKMB, CKMBINDEX, TROPONINI in the last 8760 hours. BNP: Invalid input(s): POCBNP Lab Results  Component Value Date   HGBA1C 8.4 (H) 03/22/2017   Lab Results  Component Value Date   TSH 0.725 03/27/2017   No results found for: VITAMINB12 No results found for: FOLATE No results found for: IRON, TIBC, FERRITIN  Imaging and Procedures obtained prior to SNF admission: Ct Soft Tissue Neck W Contrast  Result Date: 04/12/2017 CLINICAL DATA:  52 y/o M; bleeding from surgical wound on right neck. History of nose surgery and anterior cervical discectomy/fusion 03/25/2017. EXAM: CT NECK WITH CONTRAST TECHNIQUE: Multidetector CT imaging of the neck was  performed using the standard protocol following the bolus administration of intravenous contrast. CONTRAST:  68m ISOVUE-300 IOPAMIDOL (ISOVUE-300) INJECTION 61% COMPARISON:  04/01/2017 MRI of the cervical spine. FINDINGS: Pharynx and larynx: Normal. No mass or swelling. Salivary glands: No inflammation, mass, or stone. Thyroid: Normal. Lymph nodes: None enlarged or abnormal density. Vascular: Negative. Limited intracranial: Negative. Visualized orbits: Negative. Mastoids and visualized paranasal sinuses: Clear. Skeleton: C5-C7 anterior cervical fusion and C6 corpectomy with prosthesis. There is a large prevertebral hematoma thickening the prevertebral space extending from C5 to below the field of view in the right anterior and right anterolateral prevertebral space. The hematoma measures up to 25 mm in thickness at the T1 level which is similar to the prior cervical MRI given differences in technique. Additionally, there is soft tissue attenuation masslike lesion extending from right of the thyroid gland, anterior to the sternocleidomastoid muscle, and into the right anterior cervical triangle likely representing a contiguous region of hematoma measuring 4.6 x 4.2 x 5.7 cm (AP x ML x CC series 3, image 53 and series 9, image 36). This area was not included on the prior cervical MRI due to saturation banding  and technique. The right anterior cervical triangle area of hematoma is contiguous with a defect in the skin likely representing the incision site in the right lateral neck (series 3, image 52). Upper chest: Clear lungs. Other: Negative. IMPRESSION: 1. Large prevertebral hematoma extending from C5 to below field of view in the right anterior and lateral prevertebral space, grossly stable from prior cervical MRI given differences in technique. 2. Contiguous area of hematoma extending right lateral to thyroid gland, anterior to right sternocleidomastoid muscle, to right anterior cervical triangle. This likely  represents the surgical approach. The hematoma extends to the surgical incision in the right anterior neck. This area of hemorrhage was excluded from the field of view on prior cervical MRI, and is of uncertain stability. Electronically Signed   By: Kristine Garbe M.D.   On: 04/12/2017 02:47    Assessment/Plan   ICD-10-CM   1. Abdominal mass, unspecified abdominal location R19.00   2. Continuous leakage of urine N39.45   3. Essential hypertension I10   4. PAF (paroxysmal atrial fibrillation) (HCC) I48.0   5. Quadriplegia ([place]) G82.50   6. Pressure injury of sacral region, stage 2 L89.152   7. Type 2 diabetes mellitus with other specified complication, with long-term current use of insulin (HCC) E11.69    Z79.4   8. History of back surgery Z98.890      Insert foley cath DTG, foley cath care as indicated; to prevent worsening of sacral decub in light of urinary incontinence  Refer to urology for incontinence  May need CT abdomen/pelvis to further eval mass  Cont other meds as ordered  PT/OT/ST as ordered  GOAL: short term rehab and d/c home when medically appropriate. Communicated with pt and nursing.  Labs/tests ordered: follow cbc, bmp    Gualberto Wahlen S. Perlie Gold  [facility] [address] [place], [place] [address] [phone] Cell (Monday-Friday 8 AM - 5 PM) [phone] After 5 PM and follow prompts

## 2017-04-16 ENCOUNTER — Encounter: Payer: Self-pay | Admitting: Adult Health

## 2017-04-16 ENCOUNTER — Other Ambulatory Visit: Payer: Self-pay | Admitting: Pharmacist

## 2017-04-16 NOTE — Progress Notes (Signed)
Location:   [facility] Room Number: [place] of Service:  SNF (31)   CODE STATUS: Full Code  No Known Allergies  Chief Complaint  Patient presents with  . Acute Visit    Abdominal Mass    HPI:    Past Medical History:  Diagnosis Date  . 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 Diabetes mellitus without complication ([place])   . Hypertension   . MS (congenital mitral stenosis) 2019  . Persistent atrial fibrillation with rapid ventricular response ([place]) 03/27/2017    Past Surgical History:  Procedure Laterality Date  . ANTERIOR CERVICAL DECOMP/DISCECTOMY FUSION N/A 03/25/2017   Procedure: ANTERIOR CERVICAL DECOMPRESSION/DISCECTOMY FUSION CERVICAL FIVE- CERVICAL SIX, CERVICAL SIX- CERVICAL SEVEN, CORPECTOMY;  Surgeon: Consuella Lose, MD;  Location: [facility];  Service: Neurosurgery;  Laterality: N/A;  . NOSE SURGERY     AS A CHILD    Social History   Socioeconomic History  . Marital status: Single    Spouse name: Not on file  . Number of children: Not on file  . Years of education: Not on file  . Highest education level: Not on file  Social Needs  . Financial resource strain: Not on file  . Food insecurity - worry: Not on file  . Food insecurity - inability: Not on file  . Transportation needs - medical: Not on file  . Transportation needs - non-medical: Not on file  Occupational History  . Not on file  Tobacco Use  . Smoking status: Never Smoker  . Smokeless tobacco: Never Used  Substance and Sexual Activity  . Alcohol use: No    Frequency: Never  . Drug use: No  . Sexual activity: Not on file  Other Topics Concern  . Not on file  Social History Narrative  . Not on file   Family History  Problem Relation Age of Onset  . Prostate cancer Father       VITAL SIGNS BP 124/78   Pulse 71   Temp 97.6 F (36.4 C)   Resp 18   Ht '6\' 4"'$  (1.93 m)   Wt (!) 310 lb (140.6 kg)   SpO2 96%   BMI 37.73 kg/m   Outpatient Encounter Medications as of  04/16/2017  Medication Sig  . acetaminophen (TYLENOL) 325 MG tablet Take 650 mg by mouth every 4 (four) hours as needed.  Marland Kitchen amiodarone (PACERONE) 200 MG tablet Take 1 tablet (200 mg total) by mouth daily.  Marland Kitchen b complex vitamins tablet Take 1 tablet by mouth daily.  . bisacodyl (DULCOLAX) 5 MG EC tablet Take 1 tablet (5 mg total) by mouth daily as needed for moderate constipation.  Marland Kitchen ceFEPime (MAXIPIME) IVPB Inject 2 g into the vein every 12 (twelve) hours. Indication:  Spinal abscess Last Day of Therapy:  05/16/2017 Labs - Once weekly:  CBC/D and BMP, Labs - Every other week:  ESR and CRP  . ceFEPIme 2 g in sodium chloride 0.9 % 100 mL Inject 2 g into the vein every 12 (twelve) hours.  Marland Kitchen dexamethasone (DECADRON) 1.5 MG tablet Take 2 tablets (3 mg total) by mouth every 8 (eight) hours.  Marland Kitchen diltiazem (CARDIZEM) 30 MG tablet Take 1 tablet (30 mg total) by mouth every 8 (eight) hours.  Marland Kitchen GLUCERNA (GLUCERNA) LIQD Glucerna 1.5 - Give 240 ml three times daily  . insulin aspart (NOVOLOG FLEXPEN) 100 UNIT/ML FlexPen Inject as per sliding scale subcutaneously before meals: 0 - 120 = 0 Units 121 - 150 =  2 units 151 - 200 = 4 units 201 - 250 = 6 units 251 - 300 = 8 units 301 - 350 = 10 units 351 - 400 = 12 units 401 - 450 = 14 units Notify Provider is less than 60 or greater than 450  . insulin aspart (NOVOLOG FLEXPEN) 100 UNIT/ML FlexPen Inject 6 Units into the skin 3 (three) times daily with meals.  . insulin glargine (LANTUS) 100 UNIT/ML injection Inject 0.6 mLs (60 Units total) into the skin at bedtime.  . methocarbamol (ROBAXIN) 500 MG tablet Take 1 tablet (500 mg total) by mouth every 6 (six) hours as needed for muscle spasms.  . ondansetron (ZOFRAN) 4 MG tablet Take 1 tablet (4 mg total) by mouth every 6 (six) hours as needed for nausea.  . pantoprazole (PROTONIX) 40 MG tablet Take 1 tablet (40 mg total) by mouth daily.  Marland Kitchen senna-docusate (SENOKOT-S) 8.6-50 MG tablet Take 1 tablet by mouth at  bedtime as needed for mild constipation.  . sodium chloride 0.9 % injection Inject 10 mLs into the vein 2 (two) times daily. Before and after antibiotics  . traZODone (DESYREL) 50 MG tablet Take 1 tablet (50 mg total) by mouth at bedtime as needed for sleep.  . vancomycin IVPB Inject 500 mg into the vein every 12 (twelve) hours. Indication:  Spinal abscess Last Day of Therapy:  05/16/2017 Labs - Sunday/Monday:  CBC/D, BMP, and vancomycin trough. Labs - Thursday:  BMP and vancomycin trough Labs - Every other week:  ESR and CRP   No facility-administered encounter medications on file as of 04/16/2017.      SIGNIFICANT DIAGNOSTIC EXAMS  TODAY:  03-21-17: chest x-ray: No active cardiopulmonary disease.   03-21-17: lumbar spine MRI: 1. Diffusely heterogeneous marrow signal throughout the visualized thoracolumbar spine, pelvis, and lower thoracic ribs, concerning for multiple myeloma or metastatic disease. 2. Patchy, abnormal signal throughout the visualized lower thoracic spinal cord, nonspecific. Recommend further evaluation with MRI of the cervical and thoracic spine with and without contrast. 3. Multilevel degenerative changes throughout the lumbar spine with severe central spinal canal stenosis at L2-L3 and L3-L4, likely accounting for the patient's symptoms. 4. Severe right neuroforaminal stenosis at L5-S1. Moderate left neuroforaminal stenosis at L3-L4.  03-21-17: MRI cervical and thoracic spine: 1. Scattered T2 hyperintense lesions throughout the cervical and thoracic spinal cord as well as in the pons, medulla, and cerebellum. A lesion at C6-7 is enhancing. While some lesions are associated with spinal stenosis and could in isolation reflect spondylotic myelopathy, the presence of lesions elsewhere in the cord and posterior fossa are indicative of a more widespread process such as demyelinating disease. Infection, inflammatory conditions such as sarcoidosis, and metastatic disease are  additional considerations. Brain MRI and CSF analysis are recommended. 2. 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 3. Mild spinal stenosis at T6-7 and T7-8 due to disc and facet degeneration. 4. Diffusely abnormal bone marrow signal. Considerations include multiple myeloma, metastatic disease, and other infiltrative/myelofibrotic marrow processes.  03-23-17: MRI of brain: 1. At least 5 infratentorial and greater than 10 supratentorial lesions consistent with chronic demyelination, with acute to subacute enhancing lesions bilateral occipital lobes. Subcentimeter LEFT thalamus and RIGHT basal ganglia demyelinating plaques. 2. Probable LEFT optic neuritis. 3. Mild parenchymal brain volume loss for age.  03-30-17: 2-d echo: - Left ventricle: The cavity size was normal. There was mild concentric hypertrophy. Systolic function was normal. The estimated ejection fraction was in  the range of 50% to 55%. Although no diagnostic regional wall motion abnormality was identified, this possibility cannot be completely excluded on the basis of this study. Doppler parameters are consistent with abnormal left ventricular relaxation (grade 1 diastolic dysfunction). - Left atrium: The atrium was mildly dilated.   03-31-17: ct of head: 1. Scattered cerebral white-matter hypodensities in a distribution most consistent with underlying demyelinating disease, grossly stable relative to recent MRI. 2. No other new intracranial abnormality identified.  04-01-17: chest x-ray: Probable subsegmental atelectasis at the right lung base.  No CHF. Soft tissue fullness in the right paratracheal region is more conspicuous than on the previous study. This may reflect lymphadenopathy, intrathoracic border, or vascular structure. Given the patient's lower anterior cervical fusion 1 week ago, hematoma or infection may be present. Contrast-enhanced CT scanning of the neck and chest is  recommended.  04-01-17: MRI of cervical spine: 1. New spinal cord compression and suspected cord edema from the C3 level through the C6 level appears related to material in the ventral epidural space which is favored to be hematoma (see #2). New holo-cord edema from the C3 to the C6 cord levels, superimposed on several preexisting demyelinating spinal cord lesions. 2. Large prevertebral fluid collection tracking from C4 into the upper thoracic spine, and dissecting through the right lateral neck likely along the surgical approach. In the upper thorax the collection continues and tracks into the right lateral paraspinal space. The fluid is dark on all sequences with mild rim enhancement, and favored to be a large hematoma with estimated volume exceeding 100 mL. 3. Associated regional mass effect on the neck and thoracic inlet soft tissues. No significant airway narrowing is demonstrated. 4. Postoperative changes C5 through C7 including partial C6 corpectomy.  04-12-17: ct of neck soft tissue: 1. Large prevertebral hematoma extending from C5 to below field of view in the right anterior and lateral prevertebral space, grossly stable from prior cervical MRI given differences in technique. 2. Contiguous area of hematoma extending right lateral to thyroid gland, anterior to right sternocleidomastoid muscle, to right anterior cervical triangle. This likely represents the surgical approach. The hematoma extends to the surgical incision in the right anterior neck. This area of hemorrhage was excluded from the field of view on prior cervical MRI, and is of uncertain stability.    LABS REVIEWED: TODAY:   03-21-17: wbc 8.2; hgb 13.9; hct 39.5; mcv 85.5; plt 258; glucose 176; bun 9; creat 0.84; k+ 3.9; na++ 140; ca 9.4; liver normal albumin 4.1; HIV/RPR: nr 03-22-17: hgb a1c 8.4 03-27-17: glucose 266; bun 24; creat 1.03 ;k+ 4.1; na++ 138; ca 8.9; tsh 0.725; Free T4: 1.03 03-28-17: wbc 13.2; hgb 15.5; hct 43.8; mcv  86.7; plt 208 04-01-17: urine culture: e-coli 04-02-17: wbc 27.0; hgb 11.5; hct 33.3; mcv 87.2; plt 216; glucose 236; bun 49; creat 1.45; k+ 4.0; na++ 144; ca 8.1; liver normal albumin 2.1  04-06-17: glucose 263; bun 45; creat 1.56; k+ 4.9; na++ 135; ca 7.9; liver normal albumin 1.8 04-11-17: wbc 18.0; hgb 10.9; hct 30.9; mcv 86.1; plt 183; glucose 307; bun 44; creat 1.68; k+ 5.4; na++ 135; liver normal albumin 2.2     Review of Systems  Constitutional: Negative for malaise/fatigue.  Respiratory: Negative for cough and shortness of breath.   Cardiovascular: Negative for chest pain, palpitations and leg swelling.  Gastrointestinal: Negative for abdominal pain, constipation and heartburn.  Musculoskeletal: Negative for back pain and myalgias.  Skin: Negative.  Has hematoma on right neck   Neurological: Negative for dizziness.  Psychiatric/Behavioral: The patient is not nervous/anxious.     Physical Exam  Constitutional: He is oriented to person, place, and time. He appears well-developed and well-nourished. No distress.  Obese   Neck: No tracheal deviation present. No thyromegaly present.  Hematoma right neck  Cardiovascular: Normal rate, regular rhythm and intact distal pulses.  Murmur heard. 1/6  Pulmonary/Chest: Effort normal and breath sounds normal. No respiratory distress.  Abdominal: Soft. Bowel sounds are normal. He exhibits no distension.  Musculoskeletal:  Is quadriplegic Is able to move upper extremities Has 2+ bilateral lower extremity edema   Lymphadenopathy:    He has no cervical adenopathy.  Neurological: He is alert and oriented to person, place, and time.  Skin: Skin is warm and dry. He is not diaphoretic.  Incision line without bleeding or signs of infection present   Psychiatric: He has a normal mood and affect.     ASSESSMENT/ PLAN:  TODAY:  1. Essential hypertension: stable b/p 138/78 is currently stable; will not make changes and will monitor his  status.   2. Persistent atrial fibrillation with rapid ventricular response: heart rate stable: will continue amiodarone 200 mg daily and cardizem 30 mg every 8 hours. Is not on anticoagulation due to hematoma   3. gerd without esophagitis: stable will continue protonix 40 mg daily   4. Insulin dependent type 2 diabetes mellitus uncontrolled: stable hgb a1c 8.4; will continue lantus 60 units nightly; novolog 6 units with meals with an SSI: will monitor  5. Spinal stenosis multiple sites in spine: is status post cervical spine decompression: is stable: will continue decadron 3 mg every 8 hours; has robaxin 500 mg every 6 hours as needed will continue therapy as directed to improve upon his level of independence and ability to perform adls. Will monitor  6.  Spinal abscess: is stable will continue cefepime 2 gm every 12 hours with vancomycin 500 mg twice daily through 05-16-17. Pharmacy will dose vancomycin. Will continue weekly blood work and will follow up with I/D as directed.   7. Multiple sclerosis exacerbation: without change in status. Will continue to be followed by neurology. Will continue decadron 3 mg every 8 hours and will monitor his status.   MD is aware of resident's narcotic use and is in agreement with current plan of care. We will attempt to wean resident as apropriate    Ok Edwards NP [facility]  Contact [phone] Monday through Friday 8am- 5pm  After hours call [phone]

## 2017-04-16 NOTE — Progress Notes (Signed)
This encounter was created in error - please disregard.

## 2017-04-19 LAB — CBC AND DIFFERENTIAL
HCT: 34 — AB (ref 41–53)
HEMOGLOBIN: 11.9 — AB (ref 13.5–17.5)
Neutrophils Absolute: 11
PLATELETS: 145 — AB (ref 150–399)
WBC: 12.4

## 2017-04-19 LAB — POCT ERYTHROCYTE SEDIMENTATION RATE, NON-AUTOMATED: Sed Rate: 14

## 2017-04-19 LAB — BASIC METABOLIC PANEL
BUN: 34 — AB (ref 4–21)
CREATININE: 1.2 (ref 0.6–1.3)
GLUCOSE: 266
Potassium: 4.8 (ref 3.4–5.3)
SODIUM: 135 — AB (ref 137–147)

## 2017-04-20 ENCOUNTER — Non-Acute Institutional Stay (SKILLED_NURSING_FACILITY): Payer: Medicaid Other | Admitting: Adult Health

## 2017-04-20 ENCOUNTER — Encounter: Payer: Self-pay | Admitting: Adult Health

## 2017-04-20 DIAGNOSIS — N319 Neuromuscular dysfunction of bladder, unspecified: Secondary | ICD-10-CM | POA: Diagnosis not present

## 2017-04-20 DIAGNOSIS — IMO0001 Reserved for inherently not codable concepts without codable children: Secondary | ICD-10-CM

## 2017-04-20 DIAGNOSIS — E1165 Type 2 diabetes mellitus with hyperglycemia: Secondary | ICD-10-CM | POA: Diagnosis not present

## 2017-04-20 NOTE — Progress Notes (Addendum)
Location:   [facility] Room Number: [place] of Service:  SNF (31)   CODE STATUS: Full Code  No Known Allergies  Chief Complaint  Patient presents with  . Acute Visit    Urinary Incontinence    HPI:  Staff reports that he has been leaking urine; not actually urinating. He did have a foley placed due to multiple stage II ulcerations. He denies any bladder spasms. There are no reports of fever; no reports that the foley is leaking.  His cbg readings are all over 200. He denies any excessive thirst or hunger.     Past Medical History:  Diagnosis Date  . 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 Diabetes mellitus without complication ([place])   . Hypertension   . MS (congenital mitral stenosis) 2019  . Persistent atrial fibrillation with rapid ventricular response ([place]) 03/27/2017    Past Surgical History:  Procedure Laterality Date  . ANTERIOR CERVICAL DECOMP/DISCECTOMY FUSION N/A 03/25/2017   Procedure: ANTERIOR CERVICAL DECOMPRESSION/DISCECTOMY FUSION CERVICAL FIVE- CERVICAL SIX, CERVICAL SIX- CERVICAL SEVEN, CORPECTOMY;  Surgeon: Consuella Lose, MD;  Location: [place];  Service: Neurosurgery;  Laterality: N/A;  . NOSE SURGERY     AS A CHILD    Social History   Socioeconomic History  . Marital status: Single    Spouse name: Not on file  . Number of children: Not on file  . Years of education: Not on file  . Highest education level: Not on file  Social Needs  . Financial resource strain: Not on file  . Food insecurity - worry: Not on file  . Food insecurity - inability: Not on file  . Transportation needs - medical: Not on file  . Transportation needs - non-medical: Not on file  Occupational History  . Not on file  Tobacco Use  . Smoking status: Never Smoker  . Smokeless tobacco: Never Used  Substance and Sexual Activity  . Alcohol use: No    Frequency: Never  . Drug use: No  . Sexual activity: Not on file  Other Topics Concern  . Not on file    Social History Narrative  . Not on file   Family History  Problem Relation Age of Onset  . Prostate cancer Father       VITAL SIGNS BP (!) 134/96   Pulse 73   Temp 99 F (37.2 C)   Resp 18   Ht _0  (1.93 m)   Wt (!) 310 lb (140.6 kg)   SpO2 96%   BMI 37.73 kg/m   Outpatient Encounter Medications as of 04/20/2017  Medication Sig  . acetaminophen (TYLENOL) 325 MG tablet Take 650 mg by mouth every 4 (four) hours as needed.  Marland Kitchen amiodarone (PACERONE) 200 MG tablet Take 1 tablet (200 mg total) by mouth daily.  Marland Kitchen b complex vitamins tablet Take 1 tablet by mouth daily.  . bisacodyl (DULCOLAX) 5 MG EC tablet Take 1 tablet (5 mg total) by mouth daily as needed for moderate constipation.  Marland Kitchen ceFEPime (MAXIPIME) IVPB Inject 2 g into the vein every 12 (twelve) hours. Indication:  Spinal abscess Last Day of Therapy:  05/16/2017 Labs - Once weekly:  CBC/D and BMP, Labs - Every other week:  ESR and CRP  . dexamethasone (DECADRON) 1.5 MG tablet Take 2 tablets (3 mg total) by mouth every 8 (eight) hours.  Marland Kitchen diltiazem (CARDIZEM) 30 MG tablet Take 1 tablet (30 mg total) by mouth every 8 (eight) hours.  Marland Kitchen [facility] (  GLUCERNA) LIQD Glucerna 1.5 - Give 240 ml three times daily  . insulin aspart (NOVOLOG FLEXPEN) 100 UNIT/ML FlexPen Inject as per sliding scale subcutaneously before meals: 0 - 120 = 0 Units 121 - 150 = 2 units 151 - 200 = 4 units 201 - 250 = 6 units 251 - 300 = 8 units 301 - 350 = 10 units 351 - 400 = 12 units 401 - 450 = 14 units Notify Provider is less than 60 or greater than 450  . insulin aspart (NOVOLOG FLEXPEN) 100 UNIT/ML FlexPen Inject 6 Units into the skin 3 (three) times daily with meals.  . insulin glargine (LANTUS) 100 UNIT/ML injection Inject 0.6 mLs (60 Units total) into the skin at bedtime.  . methocarbamol (ROBAXIN) 500 MG tablet Take 1 tablet (500 mg total) by mouth every 6 (six) hours as needed for muscle spasms.  . Multiple Vitamin (MULTIVITAMIN) tablet Take  1 tablet by mouth daily.  . ondansetron (ZOFRAN) 4 MG tablet Take 1 tablet (4 mg total) by mouth every 6 (six) hours as needed for nausea.  . pantoprazole (PROTONIX) 40 MG tablet Take 1 tablet (40 mg total) by mouth daily.  Marland Kitchen senna-docusate (SENOKOT-S) 8.6-50 MG tablet Take 1 tablet by mouth at bedtime as needed for mild constipation.  . sodium chloride 0.9 % injection Inject 10 mLs into the vein 2 (two) times daily. Before and after antibiotics  . traZODone (DESYREL) 50 MG tablet Take 1 tablet (50 mg total) by mouth at bedtime as needed for sleep.  . vancomycin IVPB Inject 500 mg into the vein every 12 (twelve) hours. Indication:  Spinal abscess Last Day of Therapy:  05/16/2017 Labs - Sunday/Monday:  CBC/D, BMP, and vancomycin trough. Labs - Thursday:  BMP and vancomycin trough Labs - Every other week:  ESR and CRP  . [DISCONTINUED] ceFEPIme 2 g in sodium chloride 0.9 % 100 mL Inject 2 g into the vein every 12 (twelve) hours. (Patient not taking: Reported on 04/20/2017)   No facility-administered encounter medications on file as of 04/20/2017.      SIGNIFICANT DIAGNOSTIC EXAMS  PREVIOUS:  03-21-17: chest x-ray: No active cardiopulmonary disease.   03-21-17: lumbar spine MRI: 1. Diffusely heterogeneous marrow signal throughout the visualized thoracolumbar spine, pelvis, and lower thoracic ribs, concerning for multiple myeloma or metastatic disease. 2. Patchy, abnormal signal throughout the visualized lower thoracic spinal cord, nonspecific. Recommend further evaluation with MRI of the cervical and thoracic spine with and without contrast. 3. Multilevel degenerative changes throughout the lumbar spine with severe central spinal canal stenosis at L2-L3 and L3-L4, likely accounting for the patient's symptoms. 4. Severe right neuroforaminal stenosis at L5-S1. Moderate left neuroforaminal stenosis at L3-L4.  03-21-17: MRI cervical and thoracic spine: 1. Scattered T2 hyperintense lesions throughout  the cervical and thoracic spinal cord as well as in the pons, medulla, and cerebellum. A lesion at C6-7 is enhancing. While some lesions are associated with spinal stenosis and could in isolation reflect spondylotic myelopathy, the presence of lesions elsewhere in the cord and posterior fossa are indicative of a more widespread process such as demyelinating disease. Infection, inflammatory conditions such as sarcoidosis, and metastatic disease are additional considerations. Brain MRI and CSF analysis are recommended. 2. 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 3. Mild spinal stenosis at T6-7 and T7-8 due to disc and facet degeneration. 4. Diffusely abnormal bone marrow signal. Considerations include multiple myeloma, metastatic disease, and other infiltrative/myelofibrotic marrow  processes.  03-23-17: MRI of brain: 1. At least 5 infratentorial and greater than 10 supratentorial lesions consistent with chronic demyelination, with acute to subacute enhancing lesions bilateral occipital lobes. Subcentimeter LEFT thalamus and RIGHT basal ganglia demyelinating plaques. 2. Probable LEFT optic neuritis. 3. Mild parenchymal brain volume loss for age.  03-30-17: 2-d echo: - Left ventricle: The cavity size was normal. There was mild concentric hypertrophy. Systolic function was normal. The estimated ejection fraction was in the range of 50% to 55%. Although no diagnostic regional wall motion abnormality was identified, this possibility cannot be completely excluded on the basis of this study. Doppler parameters are consistent with abnormal left ventricular relaxation (grade 1 diastolic dysfunction). - Left atrium: The atrium was mildly dilated.   03-31-17: ct of head: 1. Scattered cerebral white-matter hypodensities in a distribution most consistent with underlying demyelinating disease, grossly stable relative to recent MRI. 2. No other new intracranial  abnormality identified.  04-01-17: chest x-ray: Probable subsegmental atelectasis at the right lung base.  No CHF. Soft tissue fullness in the right paratracheal region is more conspicuous than on the previous study. This may reflect lymphadenopathy, intrathoracic border, or vascular structure. Given the patient's lower anterior cervical fusion 1 week ago, hematoma or infection may be present. Contrast-enhanced CT scanning of the neck and chest is recommended.  04-01-17: MRI of cervical spine: 1. New spinal cord compression and suspected cord edema from the C3 level through the C6 level appears related to material in the ventral epidural space which is favored to be hematoma (see #2). New holo-cord edema from the C3 to the C6 cord levels, superimposed on several preexisting demyelinating spinal cord lesions. 2. Large prevertebral fluid collection tracking from C4 into the upper thoracic spine, and dissecting through the right lateral neck likely along the surgical approach. In the upper thorax the collection continues and tracks into the right lateral paraspinal space. The fluid is dark on all sequences with mild rim enhancement, and favored to be a large hematoma with estimated volume exceeding 100 mL. 3. Associated regional mass effect on the neck and thoracic inlet soft tissues. No significant airway narrowing is demonstrated. 4. Postoperative changes C5 through C7 including partial C6 corpectomy.  04-12-17: ct of neck soft tissue: 1. Large prevertebral hematoma extending from C5 to below field of view in the right anterior and lateral prevertebral space, grossly stable from prior cervical MRI given differences in technique. 2. Contiguous area of hematoma extending right lateral to thyroid gland, anterior to right sternocleidomastoid muscle, to right anterior cervical triangle. This likely represents the surgical approach. The hematoma extends to the surgical incision in the right anterior neck. This  area of hemorrhage was excluded from the field of view on prior cervical MRI, and is of uncertain stability.    NO NEW EXAMS  LABS REVIEWED: PREVIOUS:   03-21-17: wbc 8.2; hgb 13.9; hct 39.5; mcv 85.5; plt 258; glucose 176; bun 9; creat 0.84; k+ 3.9; na++ 140; ca 9.4; liver normal albumin 4.1; HIV/RPR: nr 03-22-17: hgb a1c 8.4 03-27-17: glucose 266; bun 24; creat 1.03 ;k+ 4.1; na++ 138; ca 8.9; tsh 0.725; Free T4: 1.03 03-28-17: wbc 13.2; hgb 15.5; hct 43.8; mcv 86.7; plt 208 04-01-17: urine culture: e-coli 04-02-17: wbc 27.0; hgb 11.5; hct 33.3; mcv 87.2; plt 216; glucose 236; bun 49; creat 1.45; k+ 4.0; na++ 144; ca 8.1; liver normal albumin 2.1  04-06-17: glucose 263; bun 45; creat 1.56; k+ 4.9; na++ 135; ca 7.9; liver normal albumin 1.8 04-11-17:  wbc 18.0; hgb 10.9; hct 30.9; mcv 86.1; plt 183; glucose 307; bun 44; creat 1.68; k+ 5.4; na++ 135; liver normal albumin 2.2  TODAY:   04-15-17: glucose 130; bun 43.4;creat 1.57; k+ 5.0; na++ 138; ca 8.5  04-19-17: wbc 12.4; hgb 11.9; hct 33.6; mcv 85.9; plt 145; glucose 266; bun 34.1; creat 1.17; k+ 4.8; na++ 135; ca 8.6; CRP 0.21; sed rate 14       Review of Systems  Constitutional: Negative for malaise/fatigue.  Respiratory: Negative for cough and shortness of breath.   Cardiovascular: Negative for chest pain, palpitations and leg swelling.  Gastrointestinal: Negative for abdominal pain, constipation and heartburn.  Musculoskeletal: Negative for back pain, joint pain and myalgias.  Skin: Negative.   Neurological: Negative for dizziness.  Psychiatric/Behavioral: The patient is not nervous/anxious.      Physical Exam  Constitutional: He is oriented to person, place, and time. He appears well-developed and well-nourished. No distress.  Obese   Neck: No thyromegaly present.  Cardiovascular: Normal rate, regular rhythm and intact distal pulses.  Murmur heard. 1/6  Pulmonary/Chest: Effort normal and breath sounds normal. No respiratory  distress.  Abdominal: Soft. Bowel sounds are normal. He exhibits no distension. There is no tenderness.  Genitourinary:  Genitourinary Comments: Has foley; clear urine   Musculoskeletal: He exhibits edema.  Is quadriplegic Is able to move upper extremities Has 2+ bilateral lower extremity edema    Lymphadenopathy:    He has no cervical adenopathy.  Neurological: He is alert and oriented to person, place, and time.  Skin: Skin is warm and dry. He is not diaphoretic.  Psychiatric: He has a normal mood and affect.      ASSESSMENT/ PLAN:  TODAY:  1.  Neurogenic bladder: now has foley; will setup urology consult and will continue to monitor his status.   2. Insulin dependent type 2 diabetes mellitus uncontrolled: stable hgb a1c 8.4; will change his insulin to the following: lantus 66 units nightly and novolog 10 units with SSI.       MD is aware of resident's narcotic use and is in agreement with current plan of care. We will attempt to wean resident as apropriate    Ok Edwards NP [facility]  Contact [phone] Monday through Friday 8am- 5pm  After hours call [phone]

## 2017-04-21 ENCOUNTER — Other Ambulatory Visit: Payer: Self-pay

## 2017-04-21 ENCOUNTER — Encounter: Payer: Self-pay | Admitting: Adult Health

## 2017-04-21 ENCOUNTER — Other Ambulatory Visit: Payer: Self-pay | Admitting: Pharmacist

## 2017-04-21 ENCOUNTER — Non-Acute Institutional Stay (SKILLED_NURSING_FACILITY): Payer: Medicaid Other | Admitting: Adult Health

## 2017-04-21 DIAGNOSIS — F5101 Primary insomnia: Secondary | ICD-10-CM

## 2017-04-21 MED ORDER — ZOLPIDEM TARTRATE 5 MG PO TABS: 5.0000 mg | ORAL_TABLET | Freq: Every day | ORAL | 0 refills | Status: DC

## 2017-04-21 NOTE — Progress Notes (Signed)
Location:   [facility] Room Number: [place] of Service:  SNF (31)   CODE STATUS: Full Code  No Known Allergies  Chief Complaint  Patient presents with  . Acute Visit    Insomnia    HPI:  He tells me that he is not sleeping at night; and has not slept in the past several days. He tells me that his lack of sleep is affecting his ability to participate in therapy as well as he would like. He did try melatonin last night; which helped him to sleep for about 10 minutes. He would like to be able to get a good nights sleep.    Past Medical History:  Diagnosis Date  . 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 Diabetes mellitus without complication ([place])   . Hypertension   . MS (congenital mitral stenosis) 2019  . Persistent atrial fibrillation with rapid ventricular response ([place]) 03/27/2017    Past Surgical History:  Procedure Laterality Date  . ANTERIOR CERVICAL DECOMP/DISCECTOMY FUSION N/A 03/25/2017   Procedure: ANTERIOR CERVICAL DECOMPRESSION/DISCECTOMY FUSION CERVICAL FIVE- CERVICAL SIX, CERVICAL SIX- CERVICAL SEVEN, CORPECTOMY;  Surgeon: Consuella Lose, MD;  Location: [facility];  Service: Neurosurgery;  Laterality: N/A;  . NOSE SURGERY     AS A CHILD    Social History   Socioeconomic History  . Marital status: Single    Spouse name: Not on file  . Number of children: Not on file  . Years of education: Not on file  . Highest education level: Not on file  Social Needs  . Financial resource strain: Not on file  . Food insecurity - worry: Not on file  . Food insecurity - inability: Not on file  . Transportation needs - medical: Not on file  . Transportation needs - non-medical: Not on file  Occupational History  . Not on file  Tobacco Use  . Smoking status: Never Smoker  . Smokeless tobacco: Never Used  Substance and Sexual Activity  . Alcohol use: No    Frequency: Never  . Drug use: No  . Sexual activity: Not on file  Other Topics Concern  . Not on  file  Social History Narrative  . Not on file   Family History  Problem Relation Age of Onset  . Prostate cancer Father       VITAL SIGNS BP 116/80   Pulse 72   Temp 98.6 F (37 C)   Resp 20   Ht _0  (1.93 m)   Wt (!) 310 lb (140.6 kg)   SpO2 98%   BMI 37.73 kg/m   Outpatient Encounter Medications as of 04/21/2017  Medication Sig  . acetaminophen (TYLENOL) 325 MG tablet Take 650 mg by mouth every 4 (four) hours as needed.  Marland Kitchen amiodarone (PACERONE) 200 MG tablet Take 1 tablet (200 mg total) by mouth daily.  Marland Kitchen b complex vitamins tablet Take 1 tablet by mouth daily.  . bisacodyl (DULCOLAX) 5 MG EC tablet Take 1 tablet (5 mg total) by mouth daily as needed for moderate constipation.  Marland Kitchen ceFEPime (MAXIPIME) IVPB Inject 2 g into the vein every 12 (twelve) hours. Indication:  Spinal abscess Last Day of Therapy:  05/16/2017 Labs - Once weekly:  CBC/D and BMP, Labs - Every other week:  ESR and CRP  . dexamethasone (DECADRON) 1.5 MG tablet Take 2 tablets (3 mg total) by mouth every 8 (eight) hours.  Marland Kitchen diltiazem (CARDIZEM) 30 MG tablet Take 1 tablet (30 mg total) by  mouth every 8 (eight) hours.  Marland Kitchen GLUCERNA (GLUCERNA) LIQD Glucerna 1.5 - Give 240 ml three times daily  . insulin aspart (NOVOLOG FLEXPEN) 100 UNIT/ML FlexPen Inject as per sliding scale subcutaneously before meals: 0 - 120 = 0 Units 121 - 150 = 2 units 151 - 200 = 4 units 201 - 250 = 6 units 251 - 300 = 8 units 301 - 350 = 10 units 351 - 400 = 12 units 401 - 450 = 14 units Notify Provider is less than 60 or greater than 450  . insulin aspart (NOVOLOG FLEXPEN) 100 UNIT/ML FlexPen Inject 10 Units into the skin 3 (three) times daily with meals.   . Insulin Glargine (LANTUS SOLOSTAR) 100 UNIT/ML Solostar Pen Inject 66 Units into the skin at bedtime.  . Melatonin 3 MG TABS Take 1 tablet by mouth at bedtime.  . methocarbamol (ROBAXIN) 500 MG tablet Take 1 tablet (500 mg total) by mouth every 6 (six) hours as needed for  muscle spasms.  . Multiple Vitamin (MULTIVITAMIN) tablet Take 1 tablet by mouth daily.  . Nutritional Supplements (NUTRITIONAL SUPPLEMENT PO) CCD (Consistent Carbohydrates) diet - Regular texture, Regular / thin consistency  . ondansetron (ZOFRAN) 4 MG tablet Take 1 tablet (4 mg total) by mouth every 6 (six) hours as needed for nausea.  . pantoprazole (PROTONIX) 40 MG tablet Take 1 tablet (40 mg total) by mouth daily.  Marland Kitchen senna-docusate (SENOKOT-S) 8.6-50 MG tablet Take 1 tablet by mouth at bedtime as needed for mild constipation.  . sodium chloride 0.9 % injection Inject 10 mLs into the vein 2 (two) times daily. Before and after antibiotics  . traZODone (DESYREL) 50 MG tablet Take 1 tablet (50 mg total) by mouth at bedtime as needed for sleep.  . vancomycin IVPB Inject 500 mg into the vein every 12 (twelve) hours. Indication:  Spinal abscess Last Day of Therapy:  05/16/2017 Labs - Sunday/Monday:  CBC/D, BMP, and vancomycin trough. Labs - Thursday:  BMP and vancomycin trough Labs - Every other week:  ESR and CRP  . [DISCONTINUED] insulin glargine (LANTUS) 100 UNIT/ML injection Inject 0.6 mLs (60 Units total) into the skin at bedtime. (Patient not taking: Reported on 04/21/2017)   No facility-administered encounter medications on file as of 04/21/2017.      SIGNIFICANT DIAGNOSTIC EXAMS   PREVIOUS:  03-21-17: chest x-ray: No active cardiopulmonary disease.   03-21-17: lumbar spine MRI: 1. Diffusely heterogeneous marrow signal throughout the visualized thoracolumbar spine, pelvis, and lower thoracic ribs, concerning for multiple myeloma or metastatic disease. 2. Patchy, abnormal signal throughout the visualized lower thoracic spinal cord, nonspecific. Recommend further evaluation with MRI of the cervical and thoracic spine with and without contrast. 3. Multilevel degenerative changes throughout the lumbar spine with severe central spinal canal stenosis at L2-L3 and L3-L4, likely accounting for  the patient's symptoms. 4. Severe right neuroforaminal stenosis at L5-S1. Moderate left neuroforaminal stenosis at L3-L4.  03-21-17: MRI cervical and thoracic spine: 1. Scattered T2 hyperintense lesions throughout the cervical and thoracic spinal cord as well as in the pons, medulla, and cerebellum. A lesion at C6-7 is enhancing. While some lesions are associated with spinal stenosis and could in isolation reflect spondylotic myelopathy, the presence of lesions elsewhere in the cord and posterior fossa are indicative of a more widespread process such as demyelinating disease. Infection, inflammatory conditions such as sarcoidosis, and metastatic disease are additional considerations. Brain MRI and CSF analysis are recommended. 2. Cervical disc degeneration with severe spinal stenosis at  C6-7 and moderate spinal stenosis at C5-6. 3. Mild spinal stenosis at T6-7 and T7-8 due to disc and facet degeneration. 4. Diffusely abnormal bone marrow signal. Considerations include multiple myeloma, metastatic disease, and other infiltrative/myelofibrotic marrow processes.  03-23-17: MRI of brain: 1. At least 5 infratentorial and greater than 10 supratentorial lesions consistent with chronic demyelination, with acute to subacute enhancing lesions bilateral occipital lobes. Subcentimeter LEFT thalamus and RIGHT basal ganglia demyelinating plaques. 2. Probable LEFT optic neuritis. 3. Mild parenchymal brain volume loss for age.  03-30-17: 2-d echo: - Left ventricle: The cavity size was normal. There was mild concentric hypertrophy. Systolic function was normal. The estimated ejection fraction was in the range of 50% to 55%. Although no diagnostic regional wall motion abnormality was identified, this possibility cannot be completely excluded on the basis of this study. Doppler parameters are consistent with abnormal left ventricular relaxation (grade 1 diastolic dysfunction). - Left atrium: The atrium was mildly dilated.    03-31-17: ct of head: 1. Scattered cerebral white-matter hypodensities in a distribution most consistent with underlying demyelinating disease, grossly stable relative to recent MRI. 2. No other new intracranial abnormality identified.  04-01-17: chest x-ray: Probable subsegmental atelectasis at the right lung base.  No CHF. Soft tissue fullness in the right paratracheal region is more conspicuous than on the previous study. This may reflect lymphadenopathy, intrathoracic border, or vascular structure. Given the patient's lower anterior cervical fusion 1 week ago, hematoma or infection may be present. Contrast-enhanced CT scanning of the neck and chest is recommended.  04-01-17: MRI of cervical spine: 1. New spinal cord compression and suspected cord edema from the C3 level through the C6 level appears related to material in the ventral epidural space which is favored to be hematoma (see #2). New holo-cord edema from the C3 to the C6 cord levels, superimposed on several preexisting demyelinating spinal cord lesions. 2. Large prevertebral fluid collection tracking from C4 into the upper thoracic spine, and dissecting through the right lateral neck likely along the surgical approach. In the upper thorax the collection continues and tracks into the right lateral paraspinal space. The fluid is dark on all sequences with mild rim enhancement, and favored to be a large hematoma with estimated volume exceeding 100 mL. 3. Associated regional mass effect on the neck and thoracic inlet soft tissues. No significant airway narrowing is demonstrated. 4. Postoperative changes C5 through C7 including partial C6 corpectomy.  04-12-17: ct of neck soft tissue: 1. Large prevertebral hematoma extending from C5 to below field of view in the right anterior and lateral prevertebral space, grossly stable from prior cervical MRI given differences in technique. 2. Contiguous area of hematoma extending right lateral to thyroid  gland, anterior to right sternocleidomastoid muscle, to right anterior cervical triangle. This likely represents the surgical approach. The hematoma extends to the surgical incision in the right anterior neck. This area of hemorrhage was excluded from the field of view on prior cervical MRI, and is of uncertain stability.    NO NEW EXAMS  LABS REVIEWED: PREVIOUS:   03-21-17: wbc 8.2; hgb 13.9; hct 39.5; mcv 85.5; plt 258; glucose 176; bun 9; creat 0.84; k+ 3.9; na++ 140; ca 9.4; liver normal albumin 4.1; HIV/RPR: nr 03-22-17: hgb a1c 8.4 03-27-17: glucose 266; bun 24; creat 1.03 ;k+ 4.1; na++ 138; ca 8.9; tsh 0.725; Free T4: 1.03 03-28-17: wbc 13.2; hgb 15.5; hct 43.8; mcv 86.7; plt 208 04-01-17: urine culture: e-coli 04-02-17: wbc 27.0; hgb 11.5; hct 33.3; mcv 87.2;  plt 216; glucose 236; bun 49; creat 1.45; k+ 4.0; na++ 144; ca 8.1; liver normal albumin 2.1  04-06-17: glucose 263; bun 45; creat 1.56; k+ 4.9; na++ 135; ca 7.9; liver normal albumin 1.8 04-11-17: wbc 18.0; hgb 10.9; hct 30.9; mcv 86.1; plt 183; glucose 307; bun 44; creat 1.68; k+ 5.4; na++ 135; liver normal albumin 2.2 04-15-17: glucose 130; bun 43.4;creat 1.57; k+ 5.0; na++ 138; ca 8.5  04-19-17: wbc 12.4; hgb 11.9; hct 33.6; mcv 85.9; plt 145; glucose 266; bun 34.1; creat 1.17; k+ 4.8; na++ 135; ca 8.6; CRP 0.21; sed rate 14     NO NEW LABS     Review of Systems  Constitutional: Negative for malaise/fatigue.  Respiratory: Negative for cough and shortness of breath.   Cardiovascular: Negative for chest pain, palpitations and leg swelling.  Gastrointestinal: Negative for abdominal pain, constipation and heartburn.  Musculoskeletal: Negative for back pain, joint pain and myalgias.  Skin: Negative.   Neurological: Negative for dizziness.  Psychiatric/Behavioral: The patient has insomnia. The patient is not nervous/anxious.        Physical Exam  Constitutional: He is oriented to person, place, and time. He appears well-developed  and well-nourished. No distress.  Obese   Neck: No thyromegaly present.  Cardiovascular: Normal rate, regular rhythm and intact distal pulses.  Murmur heard. 1/6  Pulmonary/Chest: Effort normal and breath sounds normal. No respiratory distress.  Abdominal: Soft. Bowel sounds are normal. He exhibits no distension. There is no tenderness.  Musculoskeletal: He exhibits edema.  Is quadriplegic Is able to move upper extremities Has 2+ bilateral lower extremity edema    Lymphadenopathy:    He has no cervical adenopathy.  Neurological: He is alert and oriented to person, place, and time.  Skin: Skin is warm and dry. He is not diaphoretic.  Psychiatric: He has a normal mood and affect.    ASSESSMENT/ PLAN:  TODAY:   1. Insomnia: worse: will begin ambien 5 mg nightly and will monitor his status.   MD is aware of resident's narcotic use and is in agreement with current plan of care. We will attempt to wean resident as apropriate   Ok Edwards NP [facility]  Contact [phone] Monday through Friday 8am- 5pm  After hours call [phone]

## 2017-04-21 NOTE — Telephone Encounter (Signed)
RX faxed to [facility] @ [phone], phone number [phone] 

## 2017-04-22 ENCOUNTER — Encounter: Payer: Self-pay | Admitting: Adult Health

## 2017-04-22 ENCOUNTER — Non-Acute Institutional Stay: Payer: Medicaid Other | Admitting: Adult Health

## 2017-04-22 DIAGNOSIS — G4700 Insomnia, unspecified: Secondary | ICD-10-CM | POA: Insufficient documentation

## 2017-04-22 DIAGNOSIS — E1165 Type 2 diabetes mellitus with hyperglycemia: Secondary | ICD-10-CM

## 2017-04-22 DIAGNOSIS — Z794 Long term (current) use of insulin: Secondary | ICD-10-CM

## 2017-04-22 DIAGNOSIS — N319 Neuromuscular dysfunction of bladder, unspecified: Secondary | ICD-10-CM | POA: Insufficient documentation

## 2017-04-22 DIAGNOSIS — IMO0002 Reserved for concepts with insufficient information to code with codable children: Secondary | ICD-10-CM

## 2017-04-22 LAB — BASIC METABOLIC PANEL
BUN: 54 — AB (ref 4–21)
Creatinine: 2 — AB (ref 0.6–1.3)
GLUCOSE: 199
Potassium: 5.3 (ref 3.4–5.3)
Sodium: 134 — AB (ref 137–147)

## 2017-04-22 LAB — FUNGAL ORGANISM REFLEX

## 2017-04-22 LAB — FUNGUS CULTURE WITH STAIN

## 2017-04-22 LAB — FUNGUS CULTURE RESULT

## 2017-04-22 NOTE — Progress Notes (Signed)
Location:   [facility] Room Number: [place] of Service:  SNF (31)   CODE STATUS: Full Code  No Known Allergies  Chief Complaint  Patient presents with  . Acute Visit    Diabetes Mellitus    HPI:  His cbgs remain elevated. He was taking metformin at home. His hgb a1c is 8.4. He is tolerating his insulin without difficulty. There are no reports of missed doses. He denies any excessive thirst; hunger. He denies any anxiety. There are no nursing concerns at this time.    Past Medical History:  Diagnosis Date  . 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 Diabetes mellitus without complication ([place])   . Hypertension   . MS (congenital mitral stenosis) 2019  . Persistent atrial fibrillation with rapid ventricular response ([place]) 03/27/2017    Past Surgical History:  Procedure Laterality Date  . ANTERIOR CERVICAL DECOMP/DISCECTOMY FUSION N/A 03/25/2017   Procedure: ANTERIOR CERVICAL DECOMPRESSION/DISCECTOMY FUSION CERVICAL FIVE- CERVICAL SIX, CERVICAL SIX- CERVICAL SEVEN, CORPECTOMY;  Surgeon: Consuella Lose, MD;  Location: [facility];  Service: Neurosurgery;  Laterality: N/A;  . NOSE SURGERY     AS A CHILD    Social History   Socioeconomic History  . Marital status: Single    Spouse name: Not on file  . Number of children: Not on file  . Years of education: Not on file  . Highest education level: Not on file  Occupational History  . Not on file  Social Needs  . Financial resource strain: Not on file  . Food insecurity:    Worry: Not on file    Inability: Not on file  . Transportation needs:    Medical: Not on file    Non-medical: Not on file  Tobacco Use  . Smoking status: Never Smoker  . Smokeless tobacco: Never Used  Substance and Sexual Activity  . Alcohol use: No    Frequency: Never  . Drug use: No  . Sexual activity: Not on file  Lifestyle  . Physical activity:    Days per week: Not on file    Minutes per session: Not on file  . Stress: Not on  file  Relationships  . Social connections:    Talks on phone: Not on file    Gets together: Not on file    Attends religious service: Not on file    Active member of club or organization: Not on file    Attends meetings of clubs or organizations: Not on file    Relationship status: Not on file  . Intimate partner violence:    Fear of current or ex partner: Not on file    Emotionally abused: Not on file    Physically abused: Not on file    Forced sexual activity: Not on file  Other Topics Concern  . Not on file  Social History Narrative  . Not on file   Family History  Problem Relation Age of Onset  . Prostate cancer Father       VITAL SIGNS BP (!) 152/86   Pulse 72   Temp 97.6 F (36.4 C)   Resp 18   Ht '6\' 4"'$  (1.93 m)   Wt (!) 310 lb (140.6 kg)   SpO2 97%   BMI 37.73 kg/m   Outpatient Encounter Medications as of 04/22/2017  Medication Sig  . acetaminophen (TYLENOL) 325 MG tablet Take 650 mg by mouth every 4 (four) hours as needed.  Marland Kitchen amiodarone (PACERONE) 200 MG tablet Take  1 tablet (200 mg total) by mouth daily.  Marland Kitchen b complex vitamins tablet Take 1 tablet by mouth daily.  . bisacodyl (DULCOLAX) 5 MG EC tablet Take 1 tablet (5 mg total) by mouth daily as needed for moderate constipation.  Marland Kitchen ceFEPime (MAXIPIME) IVPB Inject 2 g into the vein every 12 (twelve) hours. Indication:  Spinal abscess Last Day of Therapy:  05/16/2017 Labs - Once weekly:  CBC/D and BMP, Labs - Every other week:  ESR and CRP  . dexamethasone (DECADRON) 1.5 MG tablet Take 2 tablets (3 mg total) by mouth every 8 (eight) hours.  Marland Kitchen diltiazem (CARDIZEM) 30 MG tablet Take 1 tablet (30 mg total) by mouth every 8 (eight) hours.  Marland Kitchen GLUCERNA (GLUCERNA) LIQD Glucerna 1.5 - Give 240 ml three times daily  . insulin aspart (NOVOLOG FLEXPEN) 100 UNIT/ML FlexPen Inject as per sliding scale subcutaneously before meals: 0 - 120 = 0 Units 121 - 150 = 2 units 151 - 200 = 4 units 201 - 250 = 6 units 251 - 300 =  8 units 301 - 350 = 10 units 351 - 400 = 12 units 401 - 450 = 14 units Notify Provider is less than 60 or greater than 450  . insulin aspart (NOVOLOG FLEXPEN) 100 UNIT/ML FlexPen Inject 10 Units into the skin 3 (three) times daily with meals.   . Insulin Glargine (LANTUS SOLOSTAR) 100 UNIT/ML Solostar Pen Inject 66 Units into the skin at bedtime.  . methocarbamol (ROBAXIN) 500 MG tablet Take 1 tablet (500 mg total) by mouth every 6 (six) hours as needed for muscle spasms.  . Multiple Vitamin (MULTIVITAMIN) tablet Take 1 tablet by mouth daily.  . Nutritional Supplements (NUTRITIONAL SUPPLEMENT PO) CCD (Consistent Carbohydrates) diet - Regular texture, Regular / thin consistency  . ondansetron (ZOFRAN) 4 MG tablet Take 1 tablet (4 mg total) by mouth every 6 (six) hours as needed for nausea.  . pantoprazole (PROTONIX) 40 MG tablet Take 1 tablet (40 mg total) by mouth daily.  Marland Kitchen senna-docusate (SENOKOT-S) 8.6-50 MG tablet Take 1 tablet by mouth at bedtime as needed for mild constipation.  . sodium chloride 0.9 % injection Inject 10 mLs into the vein 2 (two) times daily. Before and after antibiotics  . traZODone (DESYREL) 50 MG tablet Take 1 tablet (50 mg total) by mouth at bedtime as needed for sleep.  . vancomycin IVPB Inject 500 mg into the vein every 12 (twelve) hours. Indication:  Spinal abscess Last Day of Therapy:  05/16/2017 Labs - Sunday/Monday:  CBC/D, BMP, and vancomycin trough. Labs - Thursday:  BMP and vancomycin trough Labs - Every other week:  ESR and CRP  . zolpidem (AMBIEN) 5 MG tablet Take 1 tablet (5 mg total) by mouth at bedtime.  . [DISCONTINUED] Melatonin 3 MG TABS Take 1 tablet by mouth at bedtime.   No facility-administered encounter medications on file as of 04/22/2017.      SIGNIFICANT DIAGNOSTIC EXAMS  PREVIOUS:  03-21-17: chest x-ray: No active cardiopulmonary disease.   03-21-17: lumbar spine MRI: 1. Diffusely heterogeneous marrow signal throughout the visualized  thoracolumbar spine, pelvis, and lower thoracic ribs, concerning for multiple myeloma or metastatic disease. 2. Patchy, abnormal signal throughout the visualized lower thoracic spinal cord, nonspecific. Recommend further evaluation with MRI of the cervical and thoracic spine with and without contrast. 3. Multilevel degenerative changes throughout the lumbar spine with severe central spinal canal stenosis at L2-L3 and L3-L4, likely accounting for the patient's symptoms. 4. Severe right neuroforaminal  stenosis at L5-S1. Moderate left neuroforaminal stenosis at L3-L4.  03-21-17: MRI cervical and thoracic spine: 1. Scattered T2 hyperintense lesions throughout the cervical and thoracic spinal cord as well as in the pons, medulla, and cerebellum. A lesion at C6-7 is enhancing. While some lesions are associated with spinal stenosis and could in isolation reflect spondylotic myelopathy, the presence of lesions elsewhere in the cord and posterior fossa are indicative of a more widespread process such as demyelinating disease. Infection, inflammatory conditions such as sarcoidosis, and metastatic disease are additional considerations. Brain MRI and CSF analysis are recommended. 2. 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 3. Mild spinal stenosis at T6-7 and T7-8 due to disc and facet degeneration. 4. Diffusely abnormal bone marrow signal. Considerations include multiple myeloma, metastatic disease, and other infiltrative/myelofibrotic marrow processes.  03-23-17: MRI of brain: 1. At least 5 infratentorial and greater than 10 supratentorial lesions consistent with chronic demyelination, with acute to subacute enhancing lesions bilateral occipital lobes. Subcentimeter LEFT thalamus and RIGHT basal ganglia demyelinating plaques. 2. Probable LEFT optic neuritis. 3. Mild parenchymal brain volume loss for age.  03-30-17: 2-d echo: - Left ventricle: The cavity  size was normal. There was mild concentric hypertrophy. Systolic function was normal. The estimated ejection fraction was in the range of 50% to 55%. Although no diagnostic regional wall motion abnormality was identified, this possibility cannot be completely excluded on the basis of this study. Doppler parameters are consistent with abnormal left ventricular relaxation (grade 1 diastolic dysfunction). - Left atrium: The atrium was mildly dilated.   03-31-17: ct of head: 1. Scattered cerebral white-matter hypodensities in a distribution most consistent with underlying demyelinating disease, grossly stable relative to recent MRI. 2. No other new intracranial abnormality identified.  04-01-17: chest x-ray: Probable subsegmental atelectasis at the right lung base.  No CHF. Soft tissue fullness in the right paratracheal region is more conspicuous than on the previous study. This may reflect lymphadenopathy, intrathoracic border, or vascular structure. Given the patient's lower anterior cervical fusion 1 week ago, hematoma or infection may be present. Contrast-enhanced CT scanning of the neck and chest is recommended.  04-01-17: MRI of cervical spine: 1. New spinal cord compression and suspected cord edema from the C3 level through the C6 level appears related to material in the ventral epidural space which is favored to be hematoma (see #2). New holo-cord edema from the C3 to the C6 cord levels, superimposed on several preexisting demyelinating spinal cord lesions. 2. Large prevertebral fluid collection tracking from C4 into the upper thoracic spine, and dissecting through the right lateral neck likely along the surgical approach. In the upper thorax the collection continues and tracks into the right lateral paraspinal space. The fluid is dark on all sequences with mild rim enhancement, and favored to be a large hematoma with estimated volume exceeding 100 mL. 3. Associated regional mass effect on the neck and  thoracic inlet soft tissues. No significant airway narrowing is demonstrated. 4. Postoperative changes C5 through C7 including partial C6 corpectomy.  04-12-17: ct of neck soft tissue: 1. Large prevertebral hematoma extending from C5 to below field of view in the right anterior and lateral prevertebral space, grossly stable from prior cervical MRI given differences in technique. 2. Contiguous area of hematoma extending right lateral to thyroid gland, anterior to right sternocleidomastoid muscle, to right anterior cervical triangle. This likely represents the surgical approach. The hematoma extends to the surgical incision in the right anterior  neck. This area of hemorrhage was excluded from the field of view on prior cervical MRI, and is of uncertain stability.    NO NEW EXAMS  LABS REVIEWED: PREVIOUS:   03-21-17: wbc 8.2; hgb 13.9; hct 39.5; mcv 85.5; plt 258; glucose 176; bun 9; creat 0.84; k+ 3.9; na++ 140; ca 9.4; liver normal albumin 4.1; HIV/RPR: nr 03-22-17: hgb a1c 8.4 03-27-17: glucose 266; bun 24; creat 1.03 ;k+ 4.1; na++ 138; ca 8.9; tsh 0.725; Free T4: 1.03 03-28-17: wbc 13.2; hgb 15.5; hct 43.8; mcv 86.7; plt 208 04-01-17: urine culture: e-coli 04-02-17: wbc 27.0; hgb 11.5; hct 33.3; mcv 87.2; plt 216; glucose 236; bun 49; creat 1.45; k+ 4.0; na++ 144; ca 8.1; liver normal albumin 2.1  04-06-17: glucose 263; bun 45; creat 1.56; k+ 4.9; na++ 135; ca 7.9; liver normal albumin 1.8 04-11-17: wbc 18.0; hgb 10.9; hct 30.9; mcv 86.1; plt 183; glucose 307; bun 44; creat 1.68; k+ 5.4; na++ 135; liver normal albumin 2.2 04-15-17: glucose 130; bun 43.4;creat 1.57; k+ 5.0; na++ 138; ca 8.5  04-19-17: wbc 12.4; hgb 11.9; hct 33.6; mcv 85.9; plt 145; glucose 266; bun 34.1; creat 1.17; k+ 4.8; na++ 135; ca 8.6; CRP 0.21; sed rate 14     NO NEW LABS     Review of Systems  Constitutional: Negative for malaise/fatigue.  Respiratory: Negative for cough and shortness of breath.   Cardiovascular: Negative  for chest pain, palpitations and leg swelling.  Gastrointestinal: Negative for abdominal pain, constipation and heartburn.  Musculoskeletal: Negative for back pain, joint pain and myalgias.  Skin: Negative.   Neurological: Negative for dizziness.  Endo/Heme/Allergies: Negative for polydipsia.  Psychiatric/Behavioral: The patient is not nervous/anxious.     Physical Exam  Constitutional: He is oriented to person, place, and time. He appears well-developed and well-nourished. No distress.  Obese   Neck: No thyromegaly present.  Cardiovascular: Normal rate, regular rhythm and intact distal pulses.  Murmur heard. 1/6  Pulmonary/Chest: Effort normal and breath sounds normal. No respiratory distress.  Abdominal: Soft. Bowel sounds are normal. He exhibits no distension. There is no tenderness.  Genitourinary:  Genitourinary Comments: Has foley   Musculoskeletal: He exhibits edema.  Is quadriplegic Is able to move upper extremities Has 2+ bilateral lower extremity edema     Lymphadenopathy:    He has no cervical adenopathy.  Neurological: He is alert and oriented to person, place, and time.  Skin: Skin is dry. He is not diaphoretic.  Psychiatric: He has a normal mood and affect.   .  ASSESSMENT/ PLAN:  TODAY:   1. Insulin dependent type 2 diabetes mellitus uncontrolled: stable hgb a1c 8.4; will change his insulin to the following: lantus 70 units nightly and novolog 10 units with SSI.   Will begin metformin xr 1 gm daily and will monitor his status.      MD is aware of resident's narcotic use and is in agreement with current plan of care. We will attempt to wean resident as apropriate   Ok Edwards NP [facility]  Contact [phone] Monday through Friday 8am- 5pm  After hours call [phone]

## 2017-04-22 NOTE — Addendum Note (Signed)
Addended by: Sharee Holster on: 04/22/2017 11:54 AM   Modules accepted: Level of Service

## 2017-04-26 LAB — CBC AND DIFFERENTIAL
HEMATOCRIT: 39 — AB (ref 41–53)
HEMOGLOBIN: 13.3 — AB (ref 13.5–17.5)
NEUTROS ABS: 11
PLATELETS: 122 — AB (ref 150–399)
WBC: 12.3

## 2017-04-26 LAB — BASIC METABOLIC PANEL
BUN: 45 — AB (ref 4–21)
Creatinine: 1.5 — AB (ref 0.6–1.3)
Glucose: 58
Potassium: 4.4 (ref 3.4–5.3)
Sodium: 137 (ref 137–147)

## 2017-04-28 ENCOUNTER — Non-Acute Institutional Stay (SKILLED_NURSING_FACILITY): Payer: Medicaid Other | Admitting: Adult Health

## 2017-04-28 ENCOUNTER — Other Ambulatory Visit: Payer: Self-pay

## 2017-04-28 ENCOUNTER — Encounter: Payer: Self-pay | Admitting: Adult Health

## 2017-04-28 DIAGNOSIS — F4323 Adjustment disorder with mixed anxiety and depressed mood: Secondary | ICD-10-CM

## 2017-04-28 MED ORDER — LORAZEPAM 0.5 MG PO TABS: 0.5000 mg | ORAL_TABLET | Freq: Four times a day (QID) | ORAL | 0 refills | Status: AC | PRN

## 2017-04-28 NOTE — Progress Notes (Signed)
Location:   [facility] Room Number: [place] of Service:  SNF (31)   CODE STATUS: Full Code  No Known Allergies  Chief Complaint  Patient presents with  . Acute Visit    Anxiety    HPI:  He has had a neurology appointment today. The discussion was not good; he tells me that he had found out that he may never walk again. He is very upset and tearful. He feels as tough things are not going to get better. He would like to have a counselor to speak with. We did discuss treatment options he is interested in taking medications to help with his mood state.   Past Medical History:  Diagnosis Date  . 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 Diabetes mellitus without complication ([place])   . Hypertension   . MS (congenital mitral stenosis) 2019  . Persistent atrial fibrillation with rapid ventricular response ([place]) 03/27/2017    Past Surgical History:  Procedure Laterality Date  . ANTERIOR CERVICAL DECOMP/DISCECTOMY FUSION N/A 03/25/2017   Procedure: ANTERIOR CERVICAL DECOMPRESSION/DISCECTOMY FUSION CERVICAL FIVE- CERVICAL SIX, CERVICAL SIX- CERVICAL SEVEN, CORPECTOMY;  Surgeon: Consuella Lose, MD;  Location: [facility];  Service: Neurosurgery;  Laterality: N/A;  . NOSE SURGERY     AS A CHILD    Social History   Socioeconomic History  . Marital status: Single    Spouse name: Not on file  . Number of children: Not on file  . Years of education: Not on file  . Highest education level: Not on file  Occupational History  . Not on file  Social Needs  . Financial resource strain: Not on file  . Food insecurity:    Worry: Not on file    Inability: Not on file  . Transportation needs:    Medical: Not on file    Non-medical: Not on file  Tobacco Use  . Smoking status: Never Smoker  . Smokeless tobacco: Never Used  Substance and Sexual Activity  . Alcohol use: No    Frequency: Never  . Drug use: No  . Sexual activity: Not on file  Lifestyle  . Physical activity:   Days per week: Not on file    Minutes per session: Not on file  . Stress: Not on file  Relationships  . Social connections:    Talks on phone: Not on file    Gets together: Not on file    Attends religious service: Not on file    Active member of club or organization: Not on file    Attends meetings of clubs or organizations: Not on file    Relationship status: Not on file  . Intimate partner violence:    Fear of current or ex partner: Not on file    Emotionally abused: Not on file    Physically abused: Not on file    Forced sexual activity: Not on file  Other Topics Concern  . Not on file  Social History Narrative  . Not on file   Family History  Problem Relation Age of Onset  . Prostate cancer Father       VITAL SIGNS BP 112/78   Pulse 98   Temp 98 F (36.7 C)   Resp 20   Ht 6' 4"  (1.93 m)   Wt 266 lb 6.4 oz (120.8 kg)   SpO2 98%   BMI 32.43 kg/m   Outpatient Encounter Medications as of 04/28/2017  Medication Sig  . acetaminophen (TYLENOL) 325 MG tablet  Take 650 mg by mouth every 4 (four) hours as needed.  [place] Kitchen amiodarone (PACERONE) 200 MG tablet Take 1 tablet (200 mg total) by mouth daily.  [place] Kitchen b complex vitamins tablet Take 1 tablet by mouth daily.  . bisacodyl (DULCOLAX) 5 MG EC tablet Take 1 tablet (5 mg total) by mouth daily as needed for moderate constipation.  [place] Kitchen ceFEPime (MAXIPIME) IVPB Inject 2 g into the vein every 12 (twelve) hours. Indication:  Spinal abscess Last Day of Therapy:  05/16/2017 Labs - Once weekly:  CBC/D and BMP, Labs - Every other week:  ESR and CRP  . dexamethasone (DECADRON) 1.5 MG tablet Take 2 tablets (3 mg total) by mouth every 8 (eight) hours.  [place] Kitchen diltiazem (CARDIZEM) 30 MG tablet Take 1 tablet (30 mg total) by mouth every 8 (eight) hours.  [place] Kitchen GLUCERNA (GLUCERNA) LIQD Glucerna 1.5 - Give 240 ml three times daily  . insulin aspart (NOVOLOG FLEXPEN) 100 UNIT/ML FlexPen Inject as per sliding scale subcutaneously before meals: 0 - 120 = 0  Units 121 - 150 = 2 units 151 - 200 = 4 units 201 - 250 = 6 units 251 - 300 = 8 units 301 - 350 = 10 units 351 - 400 = 12 units 401 - 450 = 14 units Notify Provider is less than 60 or greater than 450  . insulin aspart (NOVOLOG FLEXPEN) 100 UNIT/ML FlexPen Inject 10 Units into the skin 3 (three) times daily with meals.   . Insulin Glargine (LANTUS SOLOSTAR) 100 UNIT/ML Solostar Pen Inject 70 Units into the skin at bedtime.   . metFORMIN (GLUCOPHAGE) 500 MG tablet Take 1,000 mg by mouth 2 (two) times daily with a meal.  . methocarbamol (ROBAXIN) 500 MG tablet Take 1 tablet (500 mg total) by mouth every 6 (six) hours as needed for muscle spasms.  . Multiple Vitamin (MULTIVITAMIN) tablet Take 1 tablet by mouth daily.  . Nutritional Supplements (NUTRITIONAL SUPPLEMENT PO) CCD (Consistent Carbohydrates) diet - Regular texture, Regular / thin consistency  . ondansetron (ZOFRAN) 4 MG tablet Take 1 tablet (4 mg total) by mouth every 6 (six) hours as needed for nausea.  . pantoprazole (PROTONIX) 40 MG tablet Take 1 tablet (40 mg total) by mouth daily.  [place] Kitchen senna-docusate (SENOKOT-S) 8.6-50 MG tablet Take 1 tablet by mouth at bedtime as needed for mild constipation.  . sodium chloride 0.9 % injection Inject 10 mLs into the vein 2 (two) times daily. Before and after antibiotics  . vancomycin IVPB Inject 500 mg into the vein every 12 (twelve) hours. Indication:  Spinal abscess Last Day of Therapy:  05/16/2017 Labs - Sunday/Monday:  CBC/D, BMP, and vancomycin trough. Labs - Thursday:  BMP and vancomycin trough Labs - Every other week:  ESR and CRP  . zolpidem (AMBIEN) 5 MG tablet Take 1 tablet (5 mg total) by mouth at bedtime.  . [DISCONTINUED] traZODone (DESYREL) 50 MG tablet Take 1 tablet (50 mg total) by mouth at bedtime as needed for sleep. (Patient not taking: Reported on 04/28/2017)   No facility-administered encounter medications on file as of 04/28/2017.      SIGNIFICANT DIAGNOSTIC  EXAMS  PREVIOUS:  03-21-17: chest x-ray: No active cardiopulmonary disease.   03-21-17: lumbar spine MRI: 1. Diffusely heterogeneous marrow signal throughout the visualized thoracolumbar spine, pelvis, and lower thoracic ribs, concerning for multiple myeloma or metastatic disease. 2. Patchy, abnormal signal throughout the visualized lower thoracic spinal cord, nonspecific. Recommend further evaluation with MRI of the cervical and thoracic spine  with and without contrast. 3. Multilevel degenerative changes throughout the lumbar spine with severe central spinal canal stenosis at L2-L3 and L3-L4, likely accounting for the patient's symptoms. 4. Severe right neuroforaminal stenosis at L5-S1. Moderate left neuroforaminal stenosis at L3-L4.  03-21-17: MRI cervical and thoracic spine: 1. Scattered T2 hyperintense lesions throughout the cervical and thoracic spinal cord as well as in the pons, medulla, and cerebellum. A lesion at C6-7 is enhancing. While some lesions are associated with spinal stenosis and could in isolation reflect spondylotic myelopathy, the presence of lesions elsewhere in the cord and posterior fossa are indicative of a more widespread process such as demyelinating disease. Infection, inflammatory conditions such as sarcoidosis, and metastatic disease are additional considerations. Brain MRI and CSF analysis are recommended. 2. 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 3. Mild spinal stenosis at T6-7 and T7-8 due to disc and facet degeneration. 4. Diffusely abnormal bone marrow signal. Considerations include multiple myeloma, metastatic disease, and other infiltrative/myelofibrotic marrow processes.  03-23-17: MRI of brain: 1. At least 5 infratentorial and greater than 10 supratentorial lesions consistent with chronic demyelination, with acute to subacute enhancing lesions bilateral occipital lobes. Subcentimeter LEFT thalamus and  RIGHT basal ganglia demyelinating plaques. 2. Probable LEFT optic neuritis. 3. Mild parenchymal brain volume loss for age.  03-30-17: 2-d echo: - Left ventricle: The cavity size was normal. There was mild concentric hypertrophy. Systolic function was normal. The estimated ejection fraction was in the range of 50% to 55%. Although no diagnostic regional wall motion abnormality was identified, this possibility cannot be completely excluded on the basis of this study. Doppler parameters are consistent with abnormal left ventricular relaxation (grade 1 diastolic dysfunction). - Left atrium: The atrium was mildly dilated.   03-31-17: ct of head: 1. Scattered cerebral white-matter hypodensities in a distribution most consistent with underlying demyelinating disease, grossly stable relative to recent MRI. 2. No other new intracranial abnormality identified.  04-01-17: chest x-ray: Probable subsegmental atelectasis at the right lung base.  No CHF. Soft tissue fullness in the right paratracheal region is more conspicuous than on the previous study. This may reflect lymphadenopathy, intrathoracic border, or vascular structure. Given the patient's lower anterior cervical fusion 1 week ago, hematoma or infection may be present. Contrast-enhanced CT scanning of the neck and chest is recommended.  04-01-17: MRI of cervical spine: 1. New spinal cord compression and suspected cord edema from the C3 level through the C6 level appears related to material in the ventral epidural space which is favored to be hematoma (see #2). New holo-cord edema from the C3 to the C6 cord levels, superimposed on several preexisting demyelinating spinal cord lesions. 2. Large prevertebral fluid collection tracking from C4 into the upper thoracic spine, and dissecting through the right lateral neck likely along the surgical approach. In the upper thorax the collection continues and tracks into the right lateral paraspinal space. The fluid is  dark on all sequences with mild rim enhancement, and favored to be a large hematoma with estimated volume exceeding 100 mL. 3. Associated regional mass effect on the neck and thoracic inlet soft tissues. No significant airway narrowing is demonstrated. 4. Postoperative changes C5 through C7 including partial C6 corpectomy.  04-12-17: ct of neck soft tissue: 1. Large prevertebral hematoma extending from C5 to below field of view in the right anterior and lateral prevertebral space, grossly stable from prior cervical MRI given differences in technique. 2. Contiguous area of hematoma extending  right lateral to thyroid gland, anterior to right sternocleidomastoid muscle, to right anterior cervical triangle. This likely represents the surgical approach. The hematoma extends to the surgical incision in the right anterior neck. This area of hemorrhage was excluded from the field of view on prior cervical MRI, and is of uncertain stability.    NO NEW EXAMS  LABS REVIEWED: PREVIOUS:   03-21-17: wbc 8.2; hgb 13.9; hct 39.5; mcv 85.5; plt 258; glucose 176; bun 9; creat 0.84; k+ 3.9; na++ 140; ca 9.4; liver normal albumin 4.1; HIV/RPR: nr 03-22-17: hgb a1c 8.4 03-27-17: glucose 266; bun 24; creat 1.03 ;k+ 4.1; na++ 138; ca 8.9; tsh 0.725; Free T4: 1.03 03-28-17: wbc 13.2; hgb 15.5; hct 43.8; mcv 86.7; plt 208 04-01-17: urine culture: e-coli 04-02-17: wbc 27.0; hgb 11.5; hct 33.3; mcv 87.2; plt 216; glucose 236; bun 49; creat 1.45; k+ 4.0; na++ 144; ca 8.1; liver normal albumin 2.1  04-06-17: glucose 263; bun 45; creat 1.56; k+ 4.9; na++ 135; ca 7.9; liver normal albumin 1.8 04-11-17: wbc 18.0; hgb 10.9; hct 30.9; mcv 86.1; plt 183; glucose 307; bun 44; creat 1.68; k+ 5.4; na++ 135; liver normal albumin 2.2 04-15-17: glucose 130; bun 43.4;creat 1.57; k+ 5.0; na++ 138; ca 8.5  04-19-17: wbc 12.4; hgb 11.9; hct 33.6; mcv 85.9; plt 145; glucose 266; bun 34.1; creat 1.17; k+ 4.8; na++ 135; ca 8.6; CRP 0.21; sed rate 14      NO NEW LABS     Review of Systems  Constitutional: Negative for malaise/fatigue.  Respiratory: Negative for cough and shortness of breath.   Cardiovascular: Negative for chest pain, palpitations and leg swelling.  Gastrointestinal: Negative for abdominal pain, constipation and heartburn.  Musculoskeletal: Negative for back pain, joint pain and myalgias.  Skin: Negative.   Neurological: Negative for dizziness.  Psychiatric/Behavioral: Positive for depression. The patient is nervous/anxious and has insomnia.     Physical Exam  Constitutional: He is oriented to person, place, and time. He appears well-developed and well-nourished. No distress.  Obese   Neck: No thyromegaly present.  Cardiovascular: Normal rate, regular rhythm and intact distal pulses.  Murmur heard. 1/6  Pulmonary/Chest: Effort normal and breath sounds normal. No respiratory distress.  Abdominal: Soft. Bowel sounds are normal. He exhibits no distension. There is no tenderness.  Genitourinary:  Genitourinary Comments: Has foley   Musculoskeletal: He exhibits edema.  Is quadriplegic Is able to move upper extremities Has 2+ bilateral lower extremity edema     Lymphadenopathy:    He has no cervical adenopathy.  Neurological: He is alert and oriented to person, place, and time.  Skin: Skin is warm and dry. He is not diaphoretic.  Psychiatric: He has a normal mood and affect.      .  ASSESSMENT/ PLAN:  TODAY:   1. Adjustment reaction with anxiety and depression: his status is worse; will begin zoloft 50 mg daily through 05-06-17; will then begin 100 mg daily will have ativan 0.5 mg every 6 hours as needed through 05-12-17. Will continue to monitor his status.      MD is aware of resident's narcotic use and is in agreement with current plan of care. We will attempt to wean resident as apropriate    Ok Edwards NP [facility]  Contact [phone] Monday through Friday 8am- 5pm  After hours  call [phone]

## 2017-04-28 NOTE — Telephone Encounter (Signed)
RX faxed to [facility] @ [phone], phone number [phone] 

## 2017-04-29 ENCOUNTER — Encounter: Payer: Self-pay | Admitting: Adult Health

## 2017-04-29 ENCOUNTER — Non-Acute Institutional Stay (SKILLED_NURSING_FACILITY): Payer: Medicaid Other | Admitting: Adult Health

## 2017-04-29 DIAGNOSIS — L89152 Pressure ulcer of sacral region, stage 2: Secondary | ICD-10-CM | POA: Diagnosis not present

## 2017-04-29 DIAGNOSIS — N179 Acute kidney failure, unspecified: Secondary | ICD-10-CM

## 2017-04-29 LAB — BASIC METABOLIC PANEL
BUN: 67 — AB (ref 4–21)
CREATININE: 3.1 — AB (ref 0.6–1.3)
Glucose: 144
POTASSIUM: 5.6 — AB (ref 3.4–5.3)
Sodium: 130 — AB (ref 137–147)

## 2017-04-29 NOTE — Progress Notes (Signed)
Location:   [facility] Room Number: [place] of Service:  SNF (31)   CODE STATUS: Full Code  No Known Allergies  Chief Complaint  Patient presents with  . Acute Visit    Wound Care    HPI:  He has a stage II wound on his right buttock and sacral area. He has bilateral small heel blisters the left has burst and is now dry. He denies any pain; he is having nausea vomiting no diarrhea. There are no reports of fevers present.   Past Medical History:  Diagnosis Date  . 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 Diabetes mellitus without complication ([place])   . Hypertension   . MS (congenital mitral stenosis) 2019  . Persistent atrial fibrillation with rapid ventricular response ([place]) 03/27/2017    Past Surgical History:  Procedure Laterality Date  . ANTERIOR CERVICAL DECOMP/DISCECTOMY FUSION N/A 03/25/2017   Procedure: ANTERIOR CERVICAL DECOMPRESSION/DISCECTOMY FUSION CERVICAL FIVE- CERVICAL SIX, CERVICAL SIX- CERVICAL SEVEN, CORPECTOMY;  Surgeon: Consuella Lose, MD;  Location: [place];  Service: Neurosurgery;  Laterality: N/A;  . NOSE SURGERY     AS A CHILD    Social History   Socioeconomic History  . Marital status: Single    Spouse name: Not on file  . Number of children: Not on file  . Years of education: Not on file  . Highest education level: Not on file  Occupational History  . Not on file  Social Needs  . Financial resource strain: Not on file  . Food insecurity:    Worry: Not on file    Inability: Not on file  . Transportation needs:    Medical: Not on file    Non-medical: Not on file  Tobacco Use  . Smoking status: Never Smoker  . Smokeless tobacco: Never Used  Substance and Sexual Activity  . Alcohol use: No    Frequency: Never  . Drug use: No  . Sexual activity: Not on file  Lifestyle  . Physical activity:    Days per week: Not on file    Minutes per session: Not on file  . Stress: Not on file  Relationships  . Social connections:      Talks on phone: Not on file    Gets together: Not on file    Attends religious service: Not on file    Active member of club or organization: Not on file    Attends meetings of clubs or organizations: Not on file    Relationship status: Not on file  . Intimate partner violence:    Fear of current or ex partner: Not on file    Emotionally abused: Not on file    Physically abused: Not on file    Forced sexual activity: Not on file  Other Topics Concern  . Not on file  Social History Narrative  . Not on file   Family History  Problem Relation Age of Onset  . Prostate cancer Father       VITAL SIGNS BP 110/72   Pulse 92   Temp 97.7 F (36.5 C)   Resp 18   Ht '6\' 4"'$  (1.93 m)   Wt 266 lb 6.4 oz (120.8 kg)   SpO2 98%   BMI 32.43 kg/m   Outpatient Encounter Medications as of 04/29/2017  Medication Sig  . acetaminophen (TYLENOL) 325 MG tablet Take 650 mg by mouth every 4 (four) hours as needed.  Marland Kitchen amiodarone (PACERONE) 200 MG tablet Take 1 tablet (  200 mg total) by mouth daily.  Marland Kitchen b complex vitamins tablet Take 1 tablet by mouth daily.  . bisacodyl (DULCOLAX) 5 MG EC tablet Take 1 tablet (5 mg total) by mouth daily as needed for moderate constipation.  Marland Kitchen ceFEPime (MAXIPIME) IVPB Inject 2 g into the vein every 12 (twelve) hours. Indication:  Spinal abscess Last Day of Therapy:  05/16/2017 Labs - Once weekly:  CBC/D and BMP, Labs - Every other week:  ESR and CRP  . dexamethasone (DECADRON) 1.5 MG tablet Take 2 tablets (3 mg total) by mouth every 8 (eight) hours.  Marland Kitchen diltiazem (CARDIZEM) 30 MG tablet Take 1 tablet (30 mg total) by mouth every 8 (eight) hours.  Marland Kitchen GLUCERNA (GLUCERNA) LIQD Glucerna 1.5 - Give 240 ml three times daily  . insulin aspart (NOVOLOG FLEXPEN) 100 UNIT/ML FlexPen Inject as per sliding scale subcutaneously before meals: 0 - 120 = 0 Units 121 - 150 = 2 units 151 - 200 = 4 units 201 - 250 = 6 units 251 - 300 = 8 units 301 - 350 = 10 units 351 - 400 = 12  units 401 - 450 = 14 units Notify Provider is less than 60 or greater than 450  . insulin aspart (NOVOLOG FLEXPEN) 100 UNIT/ML FlexPen Inject 10 Units into the skin 3 (three) times daily with meals.   . Insulin Glargine (LANTUS SOLOSTAR) 100 UNIT/ML Solostar Pen Inject 70 Units into the skin at bedtime.   Marland Kitchen LORazepam (ATIVAN) 0.5 MG tablet Take 1 tablet (0.5 mg total) by mouth every 6 (six) hours as needed for anxiety. X 2 weeks  . metFORMIN (GLUCOPHAGE) 500 MG tablet Take 1,000 mg by mouth daily with breakfast.   . methocarbamol (ROBAXIN) 500 MG tablet Take 1 tablet (500 mg total) by mouth every 6 (six) hours as needed for muscle spasms.  . Multiple Vitamin (MULTIVITAMIN) tablet Take 1 tablet by mouth daily.  . Nutritional Supplements (NUTRITIONAL SUPPLEMENT PO) CCD (Consistent Carbohydrates) diet - Regular texture, Regular / thin consistency. Yogurt to all trays  . ondansetron (ZOFRAN) 4 MG tablet Take 1 tablet (4 mg total) by mouth every 6 (six) hours as needed for nausea.  . pantoprazole (PROTONIX) 40 MG tablet Take 1 tablet (40 mg total) by mouth daily.  Marland Kitchen senna-docusate (SENOKOT-S) 8.6-50 MG tablet Take 1 tablet by mouth at bedtime as needed for mild constipation.  . sertraline (ZOLOFT) 50 MG tablet Take 50 mg by mouth daily. X 1 week then increase yo '100mg'$  by mouth daily  . sodium chloride 0.9 % injection Inject 10 mLs into the vein 2 (two) times daily. Before and after antibiotics  . vancomycin IVPB Inject 500 mg into the vein every 12 (twelve) hours. Indication:  Spinal abscess Last Day of Therapy:  05/16/2017 Labs - Sunday/Monday:  CBC/D, BMP, and vancomycin trough. Labs - Thursday:  BMP and vancomycin trough Labs - Every other week:  ESR and CRP  . zolpidem (AMBIEN) 10 MG tablet Take 10 mg by mouth at bedtime as needed for sleep.  . [DISCONTINUED] zolpidem (AMBIEN) 5 MG tablet Take 1 tablet (5 mg total) by mouth at bedtime. (Patient not taking: Reported on 04/29/2017)   No  facility-administered encounter medications on file as of 04/29/2017.      SIGNIFICANT DIAGNOSTIC EXAMS  PREVIOUS:  03-21-17: chest x-ray: No active cardiopulmonary disease.   03-21-17: lumbar spine MRI: 1. Diffusely heterogeneous marrow signal throughout the visualized thoracolumbar spine, pelvis, and lower thoracic ribs, concerning for multiple myeloma  or metastatic disease. 2. Patchy, abnormal signal throughout the visualized lower thoracic spinal cord, nonspecific. Recommend further evaluation with MRI of the cervical and thoracic spine with and without contrast. 3. Multilevel degenerative changes throughout the lumbar spine with severe central spinal canal stenosis at L2-L3 and L3-L4, likely accounting for the patient's symptoms. 4. Severe right neuroforaminal stenosis at L5-S1. Moderate left neuroforaminal stenosis at L3-L4.  03-21-17: MRI cervical and thoracic spine: 1. Scattered T2 hyperintense lesions throughout the cervical and thoracic spinal cord as well as in the pons, medulla, and cerebellum. A lesion at C6-7 is enhancing. While some lesions are associated with spinal stenosis and could in isolation reflect spondylotic myelopathy, the presence of lesions elsewhere in the cord and posterior fossa are indicative of a more widespread process such as demyelinating disease. Infection, inflammatory conditions such as sarcoidosis, and metastatic disease are additional considerations. Brain MRI and CSF analysis are recommended. 2. 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 3. Mild spinal stenosis at T6-7 and T7-8 due to disc and facet degeneration. 4. Diffusely abnormal bone marrow signal. Considerations include multiple myeloma, metastatic disease, and other infiltrative/myelofibrotic marrow processes.  03-23-17: MRI of brain: 1. At least 5 infratentorial and greater than 10 supratentorial lesions consistent with chronic demyelination,  with acute to subacute enhancing lesions bilateral occipital lobes. Subcentimeter LEFT thalamus and RIGHT basal ganglia demyelinating plaques. 2. Probable LEFT optic neuritis. 3. Mild parenchymal brain volume loss for age.  03-30-17: 2-d echo: - Left ventricle: The cavity size was normal. There was mild concentric hypertrophy. Systolic function was normal. The estimated ejection fraction was in the range of 50% to 55%. Although no diagnostic regional wall motion abnormality was identified, this possibility cannot be completely excluded on the basis of this study. Doppler parameters are consistent with abnormal left ventricular relaxation (grade 1 diastolic dysfunction). - Left atrium: The atrium was mildly dilated.   03-31-17: ct of head: 1. Scattered cerebral white-matter hypodensities in a distribution most consistent with underlying demyelinating disease, grossly stable relative to recent MRI. 2. No other new intracranial abnormality identified.  04-01-17: chest x-ray: Probable subsegmental atelectasis at the right lung base.  No CHF. Soft tissue fullness in the right paratracheal region is more conspicuous than on the previous study. This may reflect lymphadenopathy, intrathoracic border, or vascular structure. Given the patient's lower anterior cervical fusion 1 week ago, hematoma or infection may be present. Contrast-enhanced CT scanning of the neck and chest is recommended.  04-01-17: MRI of cervical spine: 1. New spinal cord compression and suspected cord edema from the C3 level through the C6 level appears related to material in the ventral epidural space which is favored to be hematoma (see #2). New holo-cord edema from the C3 to the C6 cord levels, superimposed on several preexisting demyelinating spinal cord lesions. 2. Large prevertebral fluid collection tracking from C4 into the upper thoracic spine, and dissecting through the right lateral neck likely along the surgical approach. In the  upper thorax the collection continues and tracks into the right lateral paraspinal space. The fluid is dark on all sequences with mild rim enhancement, and favored to be a large hematoma with estimated volume exceeding 100 mL. 3. Associated regional mass effect on the neck and thoracic inlet soft tissues. No significant airway narrowing is demonstrated. 4. Postoperative changes C5 through C7 including partial C6 corpectomy.  04-12-17: ct of neck soft tissue: 1. Large prevertebral hematoma extending from C5 to below field  of view in the right anterior and lateral prevertebral space, grossly stable from prior cervical MRI given differences in technique. 2. Contiguous area of hematoma extending right lateral to thyroid gland, anterior to right sternocleidomastoid muscle, to right anterior cervical triangle. This likely represents the surgical approach. The hematoma extends to the surgical incision in the right anterior neck. This area of hemorrhage was excluded from the field of view on prior cervical MRI, and is of uncertain stability.    NO NEW EXAMS  LABS REVIEWED: PREVIOUS:   03-21-17: wbc 8.2; hgb 13.9; hct 39.5; mcv 85.5; plt 258; glucose 176; bun 9; creat 0.84; k+ 3.9; na++ 140; ca 9.4; liver normal albumin 4.1; HIV/RPR: nr 03-22-17: hgb a1c 8.4 03-27-17: glucose 266; bun 24; creat 1.03 ;k+ 4.1; na++ 138; ca 8.9; tsh 0.725; Free T4: 1.03 03-28-17: wbc 13.2; hgb 15.5; hct 43.8; mcv 86.7; plt 208 04-01-17: urine culture: e-coli 04-02-17: wbc 27.0; hgb 11.5; hct 33.3; mcv 87.2; plt 216; glucose 236; bun 49; creat 1.45; k+ 4.0; na++ 144; ca 8.1; liver normal albumin 2.1  04-06-17: glucose 263; bun 45; creat 1.56; k+ 4.9; na++ 135; ca 7.9; liver normal albumin 1.8 04-11-17: wbc 18.0; hgb 10.9; hct 30.9; mcv 86.1; plt 183; glucose 307; bun 44; creat 1.68; k+ 5.4; na++ 135; liver normal albumin 2.2 04-15-17: glucose 130; bun 43.4;creat 1.57; k+ 5.0; na++ 138; ca 8.5  04-19-17: wbc 12.4; hgb 11.9; hct 33.6;  mcv 85.9; plt 145; glucose 266; bun 34.1; creat 1.17; k+ 4.8; na++ 135; ca 8.6; CRP 0.21; sed rate 14     TODAY:   04-29-17: glucose 144; bun 66.5; creat 3.08; k+ 5.6; na+ 130; ca 8.5 vanc 47.3    Review of Systems  Constitutional: Negative for malaise/fatigue.  Respiratory: Negative for cough and shortness of breath.   Cardiovascular: Negative for chest pain, palpitations and leg swelling.  Gastrointestinal: Positive for nausea and vomiting. Negative for abdominal pain, constipation, diarrhea and heartburn.  Musculoskeletal: Negative for back pain, joint pain and myalgias.  Skin: Negative.   Neurological: Negative for dizziness.  Psychiatric/Behavioral: The patient is not nervous/anxious.     Physical Exam  Constitutional: He is oriented to person, place, and time. He appears well-developed and well-nourished. No distress.  Obese   Neck: No thyromegaly present.  Cardiovascular: Normal rate, regular rhythm and intact distal pulses.  Murmur heard. 1/6  Pulmonary/Chest: Effort normal and breath sounds normal. No respiratory distress.  Abdominal: Soft. Bowel sounds are normal. He exhibits no distension. There is no tenderness.  Musculoskeletal: He exhibits edema.  Is quadriplegic Is able to move upper extremities Has 2+ bilateral lower extremity edema      Lymphadenopathy:    He has no cervical adenopathy.  Neurological: He is alert and oriented to person, place, and time.  Skin: Skin is warm and dry. He is not diaphoretic.  Right buttock stage II: 6.2 x 4 cm Sacral stage II: 6 x 2 cm Right heel blister intact Left heel blister has burst       Psychiatric: He has a normal mood and affect.     ASSESSMENT/ PLAN:  TODAY:   1. Stage II sacral and right buttock wound: no signs of infection present; will continue current treatment as directed is followed by wound dr.   2. Acute renal failure: will begin NS 100 cc per hour for 3 liters will repeat bmp in the AM.   MD is aware  of resident's narcotic use and is in agreement  with current plan of care. We will attempt to wean resident as apropriate   Ok Edwards NP [facility]  Contact [phone] Monday through Friday 8am- 5pm  After hours call [phone]

## 2017-04-30 ENCOUNTER — Other Ambulatory Visit: Payer: Self-pay | Admitting: Pharmacist

## 2017-04-30 ENCOUNTER — Non-Acute Institutional Stay (SKILLED_NURSING_FACILITY): Payer: Medicaid Other | Admitting: Adult Health

## 2017-04-30 ENCOUNTER — Encounter: Payer: Self-pay | Admitting: Adult Health

## 2017-04-30 DIAGNOSIS — N179 Acute kidney failure, unspecified: Secondary | ICD-10-CM | POA: Diagnosis not present

## 2017-04-30 DIAGNOSIS — F4323 Adjustment disorder with mixed anxiety and depressed mood: Secondary | ICD-10-CM | POA: Insufficient documentation

## 2017-04-30 LAB — BASIC METABOLIC PANEL
BUN: 83 — AB (ref 4–21)
CREATININE: 4.4 — AB (ref 0.6–1.3)
Glucose: 115
Potassium: 5.9 — AB (ref 3.4–5.3)
SODIUM: 133 — AB (ref 137–147)

## 2017-04-30 NOTE — Progress Notes (Signed)
Location:   [facility] Room Number: [place] of Service:  SNF (31)   CODE STATUS: Full Codes  No Known Allergies  Chief Complaint  Patient presents with  . Acute Visit    Lab results    HPI:  His renal function has worsened. He has received 1800 cc of his IVF. He got out bed today and into the therapy gym. He denies any nausea or vomiting. He is able to tolerate po intake. There are no reports of fevers present. His po intake is good at this time.   Past Medical History:  Diagnosis Date  . 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 Diabetes mellitus without complication ([place])   . Hypertension   . MS (congenital mitral stenosis) 2019  . Persistent atrial fibrillation with rapid ventricular response ([place]) 03/27/2017    Past Surgical History:  Procedure Laterality Date  . ANTERIOR CERVICAL DECOMP/DISCECTOMY FUSION N/A 03/25/2017   Procedure: ANTERIOR CERVICAL DECOMPRESSION/DISCECTOMY FUSION CERVICAL FIVE- CERVICAL SIX, CERVICAL SIX- CERVICAL SEVEN, CORPECTOMY;  Surgeon: Consuella Lose, MD;  Location: [facility];  Service: Neurosurgery;  Laterality: N/A;  . NOSE SURGERY     AS A CHILD    Social History   Socioeconomic History  . Marital status: Single    Spouse name: Not on file  . Number of children: Not on file  . Years of education: Not on file  . Highest education level: Not on file  Occupational History  . Not on file  Social Needs  . Financial resource strain: Not on file  . Food insecurity:    Worry: Not on file    Inability: Not on file  . Transportation needs:    Medical: Not on file    Non-medical: Not on file  Tobacco Use  . Smoking status: Never Smoker  . Smokeless tobacco: Never Used  Substance and Sexual Activity  . Alcohol use: No    Frequency: Never  . Drug use: No  . Sexual activity: Not on file  Lifestyle  . Physical activity:    Days per week: Not on file    Minutes per session: Not on file  . Stress: Not on file  Relationships   . Social connections:    Talks on phone: Not on file    Gets together: Not on file    Attends religious service: Not on file    Active member of club or organization: Not on file    Attends meetings of clubs or organizations: Not on file    Relationship status: Not on file  . Intimate partner violence:    Fear of current or ex partner: Not on file    Emotionally abused: Not on file    Physically abused: Not on file    Forced sexual activity: Not on file  Other Topics Concern  . Not on file  Social History Narrative  . Not on file   Family History  Problem Relation Age of Onset  . Prostate cancer Father       VITAL SIGNS BP 107/68   Pulse 68   Temp 97.9 F (36.6 C)   Resp 18   Ht '6\' 4"'$  (1.93 m)   Wt 246 lb 6.4 oz (111.8 kg)   SpO2 96%   BMI 29.99 kg/m   Outpatient Encounter Medications as of 04/30/2017  Medication Sig  . acetaminophen (TYLENOL) 325 MG tablet Take 650 mg by mouth every 4 (four) hours as needed.  Marland Kitchen amiodarone (PACERONE) 200  MG tablet Take 1 tablet (200 mg total) by mouth daily.  Marland Kitchen b complex vitamins tablet Take 1 tablet by mouth daily.  . bisacodyl (DULCOLAX) 5 MG EC tablet Take 1 tablet (5 mg total) by mouth daily as needed for moderate constipation.  Marland Kitchen ceFEPime (MAXIPIME) IVPB Inject 2 g into the vein every 12 (twelve) hours. Indication:  Spinal abscess Last Day of Therapy:  05/16/2017 Labs - Once weekly:  CBC/D and BMP, Labs - Every other week:  ESR and CRP  . collagenase (SANTYL) ointment Apply 1 application topically daily. Apply to right buttock topically every day shift for wound care  . dexamethasone (DECADRON) 1.5 MG tablet Take 2 tablets (3 mg total) by mouth every 8 (eight) hours.  Marland Kitchen diltiazem (CARDIZEM) 30 MG tablet Take 1 tablet (30 mg total) by mouth every 8 (eight) hours.  Marland Kitchen GLUCERNA (GLUCERNA) LIQD Glucerna 1.5 - Give 240 ml three times daily  . insulin aspart (NOVOLOG FLEXPEN) 100 UNIT/ML FlexPen Inject as per sliding scale  subcutaneously before meals: 0 - 120 = 0 Units 121 - 150 = 2 units 151 - 200 = 4 units 201 - 250 = 6 units 251 - 300 = 8 units 301 - 350 = 10 units 351 - 400 = 12 units 401 - 450 = 14 units Notify Provider is less than 60 or greater than 450  . insulin aspart (NOVOLOG FLEXPEN) 100 UNIT/ML FlexPen Inject 10 Units into the skin 3 (three) times daily with meals.   . Insulin Glargine (LANTUS SOLOSTAR) 100 UNIT/ML Solostar Pen Inject 70 Units into the skin at bedtime.   Marland Kitchen LORazepam (ATIVAN) 0.5 MG tablet Take 1 tablet (0.5 mg total) by mouth every 6 (six) hours as needed for anxiety. X 2 weeks  . metFORMIN (GLUCOPHAGE) 500 MG tablet Take 1,000 mg by mouth daily with breakfast.   . methocarbamol (ROBAXIN) 500 MG tablet Take 1 tablet (500 mg total) by mouth every 6 (six) hours as needed for muscle spasms.  . Multiple Vitamin (MULTIVITAMIN) tablet Take 1 tablet by mouth daily.  . Nutritional Supplements (NUTRITIONAL SUPPLEMENT PO) CCD (Consistent Carbohydrates) diet - Regular texture, Regular / thin consistency. Yogurt to all trays  . ondansetron (ZOFRAN) 4 MG tablet Take 1 tablet (4 mg total) by mouth every 6 (six) hours as needed for nausea.  . pantoprazole (PROTONIX) 40 MG tablet Take 1 tablet (40 mg total) by mouth daily.  Marland Kitchen senna-docusate (SENOKOT-S) 8.6-50 MG tablet Take 1 tablet by mouth at bedtime as needed for mild constipation.  . sertraline (ZOLOFT) 50 MG tablet Take 50 mg by mouth daily. X 1 week then increase yo '100mg'$  by mouth daily  . sodium chloride 0.9 % injection Inject 10 mLs into the vein 2 (two) times daily. Before and after antibiotics  . vancomycin IVPB Inject 500 mg into the vein every 12 (twelve) hours. Indication:  Spinal abscess Last Day of Therapy:  05/16/2017 Labs - Sunday/Monday:  CBC/D, BMP, and vancomycin trough. Labs - Thursday:  BMP and vancomycin trough Labs - Every other week:  ESR and CRP  . zolpidem (AMBIEN) 10 MG tablet Take 10 mg by mouth at bedtime as needed  for sleep.   No facility-administered encounter medications on file as of 04/30/2017.      SIGNIFICANT DIAGNOSTIC EXAMS  PREVIOUS:  03-21-17: chest x-ray: No active cardiopulmonary disease.   03-21-17: lumbar spine MRI: 1. Diffusely heterogeneous marrow signal throughout the visualized thoracolumbar spine, pelvis, and lower thoracic ribs, concerning for  multiple myeloma or metastatic disease. 2. Patchy, abnormal signal throughout the visualized lower thoracic spinal cord, nonspecific. Recommend further evaluation with MRI of the cervical and thoracic spine with and without contrast. 3. Multilevel degenerative changes throughout the lumbar spine with severe central spinal canal stenosis at L2-L3 and L3-L4, likely accounting for the patient's symptoms. 4. Severe right neuroforaminal stenosis at L5-S1. Moderate left neuroforaminal stenosis at L3-L4.  03-21-17: MRI cervical and thoracic spine: 1. Scattered T2 hyperintense lesions throughout the cervical and thoracic spinal cord as well as in the pons, medulla, and cerebellum. A lesion at C6-7 is enhancing. While some lesions are associated with spinal stenosis and could in isolation reflect spondylotic myelopathy, the presence of lesions elsewhere in the cord and posterior fossa are indicative of a more widespread process such as demyelinating disease. Infection, inflammatory conditions such as sarcoidosis, and metastatic disease are additional considerations. Brain MRI and CSF analysis are recommended. 2. 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 3. Mild spinal stenosis at T6-7 and T7-8 due to disc and facet degeneration. 4. Diffusely abnormal bone marrow signal. Considerations include multiple myeloma, metastatic disease, and other infiltrative/myelofibrotic marrow processes.  03-23-17: MRI of brain: 1. At least 5 infratentorial and greater than 10 supratentorial lesions consistent with  chronic demyelination, with acute to subacute enhancing lesions bilateral occipital lobes. Subcentimeter LEFT thalamus and RIGHT basal ganglia demyelinating plaques. 2. Probable LEFT optic neuritis. 3. Mild parenchymal brain volume loss for age.  03-30-17: 2-d echo: - Left ventricle: The cavity size was normal. There was mild concentric hypertrophy. Systolic function was normal. The estimated ejection fraction was in the range of 50% to 55%. Although no diagnostic regional wall motion abnormality was identified, this possibility cannot be completely excluded on the basis of this study. Doppler parameters are consistent with abnormal left ventricular relaxation (grade 1 diastolic dysfunction). - Left atrium: The atrium was mildly dilated.   03-31-17: ct of head: 1. Scattered cerebral white-matter hypodensities in a distribution most consistent with underlying demyelinating disease, grossly stable relative to recent MRI. 2. No other new intracranial abnormality identified.  04-01-17: chest x-ray: Probable subsegmental atelectasis at the right lung base.  No CHF. Soft tissue fullness in the right paratracheal region is more conspicuous than on the previous study. This may reflect lymphadenopathy, intrathoracic border, or vascular structure. Given the patient's lower anterior cervical fusion 1 week ago, hematoma or infection may be present. Contrast-enhanced CT scanning of the neck and chest is recommended.  04-01-17: MRI of cervical spine: 1. New spinal cord compression and suspected cord edema from the C3 level through the C6 level appears related to material in the ventral epidural space which is favored to be hematoma (see #2). New holo-cord edema from the C3 to the C6 cord levels, superimposed on several preexisting demyelinating spinal cord lesions. 2. Large prevertebral fluid collection tracking from C4 into the upper thoracic spine, and dissecting through the right lateral neck likely along the  surgical approach. In the upper thorax the collection continues and tracks into the right lateral paraspinal space. The fluid is dark on all sequences with mild rim enhancement, and favored to be a large hematoma with estimated volume exceeding 100 mL. 3. Associated regional mass effect on the neck and thoracic inlet soft tissues. No significant airway narrowing is demonstrated. 4. Postoperative changes C5 through C7 including partial C6 corpectomy.  04-12-17: ct of neck soft tissue: 1. Large prevertebral hematoma extending from C5 to  below field of view in the right anterior and lateral prevertebral space, grossly stable from prior cervical MRI given differences in technique. 2. Contiguous area of hematoma extending right lateral to thyroid gland, anterior to right sternocleidomastoid muscle, to right anterior cervical triangle. This likely represents the surgical approach. The hematoma extends to the surgical incision in the right anterior neck. This area of hemorrhage was excluded from the field of view on prior cervical MRI, and is of uncertain stability.    NO NEW EXAMS  LABS REVIEWED: PREVIOUS:   03-21-17: wbc 8.2; hgb 13.9; hct 39.5; mcv 85.5; plt 258; glucose 176; bun 9; creat 0.84; k+ 3.9; na++ 140; ca 9.4; liver normal albumin 4.1; HIV/RPR: nr 03-22-17: hgb a1c 8.4 03-27-17: glucose 266; bun 24; creat 1.03 ;k+ 4.1; na++ 138; ca 8.9; tsh 0.725; Free T4: 1.03 03-28-17: wbc 13.2; hgb 15.5; hct 43.8; mcv 86.7; plt 208 04-01-17: urine culture: e-coli 04-02-17: wbc 27.0; hgb 11.5; hct 33.3; mcv 87.2; plt 216; glucose 236; bun 49; creat 1.45; k+ 4.0; na++ 144; ca 8.1; liver normal albumin 2.1  04-06-17: glucose 263; bun 45; creat 1.56; k+ 4.9; na++ 135; ca 7.9; liver normal albumin 1.8 04-11-17: wbc 18.0; hgb 10.9; hct 30.9; mcv 86.1; plt 183; glucose 307; bun 44; creat 1.68; k+ 5.4; na++ 135; liver normal albumin 2.2 04-15-17: glucose 130; bun 43.4;creat 1.57; k+ 5.0; na++ 138; ca 8.5  04-19-17: wbc  12.4; hgb 11.9; hct 33.6; mcv 85.9; plt 145; glucose 266; bun 34.1; creat 1.17; k+ 4.8; na++ 135; ca 8.6; CRP 0.21; sed rate 14    04-29-17: glucose 144; bun 66.5; creat 3.08; k+ 5.6; na+ 130; ca 8.5 vanc 47.3  TODAY:     04-30-17: glucose 115; bun 82.5; creat 4.43; k+ 5.9; na++ 133; ca 8.7     Review of Systems  Constitutional: Negative for malaise/fatigue.  Respiratory: Negative for cough and shortness of breath.   Cardiovascular: Negative for chest pain, palpitations and leg swelling.  Gastrointestinal: Negative for abdominal pain, constipation and heartburn.  Musculoskeletal: Negative for back pain, joint pain and myalgias.  Skin: Negative.   Neurological: Negative for dizziness.  Psychiatric/Behavioral: The patient is not nervous/anxious.     Physical Exam  Constitutional: He is oriented to person, place, and time. He appears well-developed and well-nourished. No distress.  Obese   Neck: No thyromegaly present.  Cardiovascular: Normal rate, regular rhythm and intact distal pulses.  Murmur heard. 1/6  Pulmonary/Chest: Effort normal and breath sounds normal. No respiratory distress.  Abdominal: Soft. Bowel sounds are normal. He exhibits no distension. There is no tenderness.  Musculoskeletal: He exhibits edema.  Is quadriplegic Is able to move upper extremities Has 2+ bilateral lower extremity edema       Lymphadenopathy:    He has no cervical adenopathy.  Neurological: He is alert and oriented to person, place, and time.  Skin: Skin is warm and dry. He is not diaphoretic.  Right buttock stage II: 6.2 x 4 cm Sacral stage II: 6 x 2 cm Right heel blister intact Left heel blister has burst   Psychiatric: He has a normal mood and affect.     ASSESSMENT/ PLAN:  TODAY:   1. Acute renal failure: NS 500 cc fluid bolus; then NS at 125 cc per hour for 3 liters Will repeat bmp in the AM>   MD is aware of resident's narcotic use and is in agreement with current plan of care.  We will attempt to wean resident as  apropriate   Ok Edwards NP [facility]  Contact [phone] Monday through Friday 8am- 5pm  After hours call [phone]

## 2017-05-01 ENCOUNTER — Emergency Department (HOSPITAL_COMMUNITY): Payer: Medicaid Other

## 2017-05-01 ENCOUNTER — Inpatient Hospital Stay (HOSPITAL_COMMUNITY)
Admission: EM | Admit: 2017-05-01 | Discharge: 2017-07-03 | DRG: 853 | Disposition: E | Payer: Medicaid Other | Attending: Pulmonary Disease | Admitting: Pulmonary Disease

## 2017-05-01 ENCOUNTER — Encounter (HOSPITAL_COMMUNITY): Payer: Self-pay

## 2017-05-01 ENCOUNTER — Other Ambulatory Visit: Payer: Self-pay

## 2017-05-01 DIAGNOSIS — IMO0001 Reserved for inherently not codable concepts without codable children: Secondary | ICD-10-CM | POA: Diagnosis present

## 2017-05-01 DIAGNOSIS — D649 Anemia, unspecified: Secondary | ICD-10-CM | POA: Diagnosis not present

## 2017-05-01 DIAGNOSIS — J9600 Acute respiratory failure, unspecified whether with hypoxia or hypercapnia: Secondary | ICD-10-CM | POA: Diagnosis not present

## 2017-05-01 DIAGNOSIS — K921 Melena: Secondary | ICD-10-CM | POA: Diagnosis not present

## 2017-05-01 DIAGNOSIS — I248 Other forms of acute ischemic heart disease: Secondary | ICD-10-CM | POA: Diagnosis not present

## 2017-05-01 DIAGNOSIS — I10 Essential (primary) hypertension: Secondary | ICD-10-CM | POA: Diagnosis present

## 2017-05-01 DIAGNOSIS — D72829 Elevated white blood cell count, unspecified: Secondary | ICD-10-CM | POA: Diagnosis not present

## 2017-05-01 DIAGNOSIS — L89320 Pressure ulcer of left buttock, unstageable: Secondary | ICD-10-CM | POA: Diagnosis present

## 2017-05-01 DIAGNOSIS — I480 Paroxysmal atrial fibrillation: Secondary | ICD-10-CM | POA: Diagnosis not present

## 2017-05-01 DIAGNOSIS — D696 Thrombocytopenia, unspecified: Secondary | ICD-10-CM | POA: Diagnosis not present

## 2017-05-01 DIAGNOSIS — E871 Hypo-osmolality and hyponatremia: Secondary | ICD-10-CM | POA: Diagnosis not present

## 2017-05-01 DIAGNOSIS — N139 Obstructive and reflux uropathy, unspecified: Secondary | ICD-10-CM | POA: Diagnosis present

## 2017-05-01 DIAGNOSIS — B9620 Unspecified Escherichia coli [E. coli] as the cause of diseases classified elsewhere: Secondary | ICD-10-CM | POA: Diagnosis not present

## 2017-05-01 DIAGNOSIS — I70209 Unspecified atherosclerosis of native arteries of extremities, unspecified extremity: Secondary | ICD-10-CM | POA: Diagnosis present

## 2017-05-01 DIAGNOSIS — N186 End stage renal disease: Secondary | ICD-10-CM | POA: Diagnosis present

## 2017-05-01 DIAGNOSIS — E1165 Type 2 diabetes mellitus with hyperglycemia: Secondary | ICD-10-CM | POA: Diagnosis not present

## 2017-05-01 DIAGNOSIS — R450 Nervousness: Secondary | ICD-10-CM | POA: Diagnosis not present

## 2017-05-01 DIAGNOSIS — Z8661 Personal history of infections of the central nervous system: Secondary | ICD-10-CM

## 2017-05-01 DIAGNOSIS — G062 Extradural and subdural abscess, unspecified: Secondary | ICD-10-CM | POA: Diagnosis present

## 2017-05-01 DIAGNOSIS — I481 Persistent atrial fibrillation: Secondary | ICD-10-CM | POA: Diagnosis present

## 2017-05-01 DIAGNOSIS — Z794 Long term (current) use of insulin: Secondary | ICD-10-CM

## 2017-05-01 DIAGNOSIS — G4700 Insomnia, unspecified: Secondary | ICD-10-CM | POA: Diagnosis present

## 2017-05-01 DIAGNOSIS — E8770 Fluid overload, unspecified: Secondary | ICD-10-CM | POA: Diagnosis not present

## 2017-05-01 DIAGNOSIS — B3789 Other sites of candidiasis: Secondary | ICD-10-CM | POA: Diagnosis not present

## 2017-05-01 DIAGNOSIS — F321 Major depressive disorder, single episode, moderate: Secondary | ICD-10-CM | POA: Diagnosis not present

## 2017-05-01 DIAGNOSIS — N189 Chronic kidney disease, unspecified: Secondary | ICD-10-CM | POA: Diagnosis not present

## 2017-05-01 DIAGNOSIS — L0211 Cutaneous abscess of neck: Secondary | ICD-10-CM | POA: Diagnosis not present

## 2017-05-01 DIAGNOSIS — Z01810 Encounter for preprocedural cardiovascular examination: Secondary | ICD-10-CM | POA: Diagnosis not present

## 2017-05-01 DIAGNOSIS — L89313 Pressure ulcer of right buttock, stage 3: Secondary | ICD-10-CM | POA: Diagnosis present

## 2017-05-01 DIAGNOSIS — T839XXA Unspecified complication of genitourinary prosthetic device, implant and graft, initial encounter: Secondary | ICD-10-CM

## 2017-05-01 DIAGNOSIS — Z452 Encounter for adjustment and management of vascular access device: Secondary | ICD-10-CM

## 2017-05-01 DIAGNOSIS — T8141XA Infection following a procedure, superficial incisional surgical site, initial encounter: Secondary | ICD-10-CM | POA: Diagnosis present

## 2017-05-01 DIAGNOSIS — Z981 Arthrodesis status: Secondary | ICD-10-CM

## 2017-05-01 DIAGNOSIS — R1084 Generalized abdominal pain: Secondary | ICD-10-CM

## 2017-05-01 DIAGNOSIS — J9602 Acute respiratory failure with hypercapnia: Secondary | ICD-10-CM | POA: Diagnosis not present

## 2017-05-01 DIAGNOSIS — B379 Candidiasis, unspecified: Secondary | ICD-10-CM | POA: Diagnosis not present

## 2017-05-01 DIAGNOSIS — I471 Supraventricular tachycardia: Secondary | ICD-10-CM | POA: Diagnosis not present

## 2017-05-01 DIAGNOSIS — J9690 Respiratory failure, unspecified, unspecified whether with hypoxia or hypercapnia: Secondary | ICD-10-CM

## 2017-05-01 DIAGNOSIS — T148XXA Other injury of unspecified body region, initial encounter: Secondary | ICD-10-CM | POA: Diagnosis not present

## 2017-05-01 DIAGNOSIS — F329 Major depressive disorder, single episode, unspecified: Secondary | ICD-10-CM | POA: Diagnosis not present

## 2017-05-01 DIAGNOSIS — Z8739 Personal history of other diseases of the musculoskeletal system and connective tissue: Secondary | ICD-10-CM | POA: Diagnosis not present

## 2017-05-01 DIAGNOSIS — R531 Weakness: Secondary | ICD-10-CM | POA: Diagnosis not present

## 2017-05-01 DIAGNOSIS — G8250 Quadriplegia, unspecified: Secondary | ICD-10-CM | POA: Diagnosis present

## 2017-05-01 DIAGNOSIS — D638 Anemia in other chronic diseases classified elsewhere: Secondary | ICD-10-CM | POA: Diagnosis present

## 2017-05-01 DIAGNOSIS — D500 Iron deficiency anemia secondary to blood loss (chronic): Secondary | ICD-10-CM | POA: Diagnosis not present

## 2017-05-01 DIAGNOSIS — G35 Multiple sclerosis: Secondary | ICD-10-CM | POA: Diagnosis present

## 2017-05-01 DIAGNOSIS — L89153 Pressure ulcer of sacral region, stage 3: Secondary | ICD-10-CM | POA: Diagnosis present

## 2017-05-01 DIAGNOSIS — M4802 Spinal stenosis, cervical region: Secondary | ICD-10-CM | POA: Diagnosis present

## 2017-05-01 DIAGNOSIS — Z8619 Personal history of other infectious and parasitic diseases: Secondary | ICD-10-CM | POA: Diagnosis not present

## 2017-05-01 DIAGNOSIS — N170 Acute kidney failure with tubular necrosis: Secondary | ICD-10-CM | POA: Diagnosis present

## 2017-05-01 DIAGNOSIS — N19 Unspecified kidney failure: Secondary | ICD-10-CM

## 2017-05-01 DIAGNOSIS — Z9911 Dependence on respirator [ventilator] status: Secondary | ICD-10-CM | POA: Diagnosis not present

## 2017-05-01 DIAGNOSIS — E860 Dehydration: Secondary | ICD-10-CM

## 2017-05-01 DIAGNOSIS — G9341 Metabolic encephalopathy: Secondary | ICD-10-CM | POA: Diagnosis not present

## 2017-05-01 DIAGNOSIS — Z79899 Other long term (current) drug therapy: Secondary | ICD-10-CM

## 2017-05-01 DIAGNOSIS — L89312 Pressure ulcer of right buttock, stage 2: Secondary | ICD-10-CM | POA: Diagnosis present

## 2017-05-01 DIAGNOSIS — K625 Hemorrhage of anus and rectum: Secondary | ICD-10-CM | POA: Diagnosis not present

## 2017-05-01 DIAGNOSIS — T8140XA Infection following a procedure, unspecified, initial encounter: Secondary | ICD-10-CM | POA: Diagnosis not present

## 2017-05-01 DIAGNOSIS — I2510 Atherosclerotic heart disease of native coronary artery without angina pectoris: Secondary | ICD-10-CM | POA: Diagnosis present

## 2017-05-01 DIAGNOSIS — Z419 Encounter for procedure for purposes other than remedying health state, unspecified: Secondary | ICD-10-CM

## 2017-05-01 DIAGNOSIS — Z6836 Body mass index (BMI) 36.0-36.9, adult: Secondary | ICD-10-CM

## 2017-05-01 DIAGNOSIS — Z95828 Presence of other vascular implants and grafts: Secondary | ICD-10-CM | POA: Diagnosis not present

## 2017-05-01 DIAGNOSIS — T8149XA Infection following a procedure, other surgical site, initial encounter: Secondary | ICD-10-CM

## 2017-05-01 DIAGNOSIS — E11649 Type 2 diabetes mellitus with hypoglycemia without coma: Secondary | ICD-10-CM | POA: Diagnosis present

## 2017-05-01 DIAGNOSIS — R05 Cough: Secondary | ICD-10-CM | POA: Diagnosis present

## 2017-05-01 DIAGNOSIS — L8990 Pressure ulcer of unspecified site, unspecified stage: Secondary | ICD-10-CM | POA: Diagnosis not present

## 2017-05-01 DIAGNOSIS — G061 Intraspinal abscess and granuloma: Secondary | ICD-10-CM | POA: Diagnosis present

## 2017-05-01 DIAGNOSIS — T368X5A Adverse effect of other systemic antibiotics, initial encounter: Secondary | ICD-10-CM | POA: Diagnosis present

## 2017-05-01 DIAGNOSIS — R0602 Shortness of breath: Secondary | ICD-10-CM

## 2017-05-01 DIAGNOSIS — F4323 Adjustment disorder with mixed anxiety and depressed mood: Secondary | ICD-10-CM | POA: Diagnosis present

## 2017-05-01 DIAGNOSIS — Z515 Encounter for palliative care: Secondary | ICD-10-CM | POA: Diagnosis not present

## 2017-05-01 DIAGNOSIS — Z7189 Other specified counseling: Secondary | ICD-10-CM | POA: Diagnosis not present

## 2017-05-01 DIAGNOSIS — G9340 Encephalopathy, unspecified: Secondary | ICD-10-CM | POA: Diagnosis not present

## 2017-05-01 DIAGNOSIS — Z8719 Personal history of other diseases of the digestive system: Secondary | ICD-10-CM | POA: Diagnosis not present

## 2017-05-01 DIAGNOSIS — L89152 Pressure ulcer of sacral region, stage 2: Secondary | ICD-10-CM | POA: Diagnosis not present

## 2017-05-01 DIAGNOSIS — E1151 Type 2 diabetes mellitus with diabetic peripheral angiopathy without gangrene: Secondary | ICD-10-CM | POA: Diagnosis present

## 2017-05-01 DIAGNOSIS — K219 Gastro-esophageal reflux disease without esophagitis: Secondary | ICD-10-CM | POA: Diagnosis present

## 2017-05-01 DIAGNOSIS — Z7952 Long term (current) use of systemic steroids: Secondary | ICD-10-CM

## 2017-05-01 DIAGNOSIS — E875 Hyperkalemia: Secondary | ICD-10-CM

## 2017-05-01 DIAGNOSIS — T83091A Other mechanical complication of indwelling urethral catheter, initial encounter: Secondary | ICD-10-CM | POA: Diagnosis present

## 2017-05-01 DIAGNOSIS — N179 Acute kidney failure, unspecified: Secondary | ICD-10-CM

## 2017-05-01 DIAGNOSIS — N1330 Unspecified hydronephrosis: Secondary | ICD-10-CM

## 2017-05-01 DIAGNOSIS — T83098A Other mechanical complication of other indwelling urethral catheter, initial encounter: Secondary | ICD-10-CM | POA: Diagnosis not present

## 2017-05-01 DIAGNOSIS — D62 Acute posthemorrhagic anemia: Secondary | ICD-10-CM | POA: Diagnosis not present

## 2017-05-01 DIAGNOSIS — E876 Hypokalemia: Secondary | ICD-10-CM | POA: Diagnosis not present

## 2017-05-01 DIAGNOSIS — R112 Nausea with vomiting, unspecified: Secondary | ICD-10-CM

## 2017-05-01 DIAGNOSIS — R5383 Other fatigue: Secondary | ICD-10-CM | POA: Diagnosis not present

## 2017-05-01 DIAGNOSIS — Z87820 Personal history of traumatic brain injury: Secondary | ICD-10-CM | POA: Diagnosis not present

## 2017-05-01 DIAGNOSIS — N136 Pyonephrosis: Secondary | ICD-10-CM | POA: Diagnosis present

## 2017-05-01 DIAGNOSIS — G4733 Obstructive sleep apnea (adult) (pediatric): Secondary | ICD-10-CM | POA: Diagnosis present

## 2017-05-01 DIAGNOSIS — I132 Hypertensive heart and chronic kidney disease with heart failure and with stage 5 chronic kidney disease, or end stage renal disease: Secondary | ICD-10-CM | POA: Diagnosis present

## 2017-05-01 DIAGNOSIS — R059 Cough, unspecified: Secondary | ICD-10-CM

## 2017-05-01 DIAGNOSIS — B377 Candidal sepsis: Secondary | ICD-10-CM

## 2017-05-01 DIAGNOSIS — K5900 Constipation, unspecified: Secondary | ICD-10-CM | POA: Diagnosis present

## 2017-05-01 DIAGNOSIS — Z9889 Other specified postprocedural states: Secondary | ICD-10-CM | POA: Diagnosis not present

## 2017-05-01 DIAGNOSIS — M5030 Other cervical disc degeneration, unspecified cervical region: Secondary | ICD-10-CM | POA: Diagnosis present

## 2017-05-01 DIAGNOSIS — B49 Unspecified mycosis: Secondary | ICD-10-CM | POA: Diagnosis present

## 2017-05-01 DIAGNOSIS — E872 Acidosis: Secondary | ICD-10-CM | POA: Diagnosis present

## 2017-05-01 DIAGNOSIS — F419 Anxiety disorder, unspecified: Secondary | ICD-10-CM | POA: Diagnosis not present

## 2017-05-01 DIAGNOSIS — N3289 Other specified disorders of bladder: Secondary | ICD-10-CM | POA: Diagnosis present

## 2017-05-01 DIAGNOSIS — Z978 Presence of other specified devices: Secondary | ICD-10-CM | POA: Diagnosis not present

## 2017-05-01 DIAGNOSIS — R338 Other retention of urine: Secondary | ICD-10-CM | POA: Diagnosis present

## 2017-05-01 DIAGNOSIS — J9601 Acute respiratory failure with hypoxia: Secondary | ICD-10-CM | POA: Diagnosis not present

## 2017-05-01 DIAGNOSIS — E1122 Type 2 diabetes mellitus with diabetic chronic kidney disease: Secondary | ICD-10-CM | POA: Diagnosis present

## 2017-05-01 DIAGNOSIS — F5101 Primary insomnia: Secondary | ICD-10-CM | POA: Diagnosis not present

## 2017-05-01 DIAGNOSIS — N320 Bladder-neck obstruction: Secondary | ICD-10-CM | POA: Diagnosis not present

## 2017-05-01 DIAGNOSIS — N319 Neuromuscular dysfunction of bladder, unspecified: Secondary | ICD-10-CM | POA: Diagnosis present

## 2017-05-01 DIAGNOSIS — I959 Hypotension, unspecified: Secondary | ICD-10-CM | POA: Diagnosis not present

## 2017-05-01 DIAGNOSIS — R110 Nausea: Secondary | ICD-10-CM | POA: Diagnosis not present

## 2017-05-01 DIAGNOSIS — E1121 Type 2 diabetes mellitus with diabetic nephropathy: Secondary | ICD-10-CM | POA: Diagnosis present

## 2017-05-01 DIAGNOSIS — Z66 Do not resuscitate: Secondary | ICD-10-CM | POA: Diagnosis present

## 2017-05-01 DIAGNOSIS — E11622 Type 2 diabetes mellitus with other skin ulcer: Secondary | ICD-10-CM | POA: Diagnosis not present

## 2017-05-01 DIAGNOSIS — E119 Type 2 diabetes mellitus without complications: Secondary | ICD-10-CM | POA: Diagnosis not present

## 2017-05-01 DIAGNOSIS — D6959 Other secondary thrombocytopenia: Secondary | ICD-10-CM | POA: Diagnosis not present

## 2017-05-01 DIAGNOSIS — N390 Urinary tract infection, site not specified: Secondary | ICD-10-CM

## 2017-05-01 DIAGNOSIS — I509 Heart failure, unspecified: Secondary | ICD-10-CM | POA: Diagnosis present

## 2017-05-01 DIAGNOSIS — B9689 Other specified bacterial agents as the cause of diseases classified elsewhere: Secondary | ICD-10-CM | POA: Diagnosis not present

## 2017-05-01 LAB — COMPREHENSIVE METABOLIC PANEL
ALT: 35 U/L (ref 17–63)
AST: 20 U/L (ref 15–41)
Albumin: 2.4 g/dL — ABNORMAL LOW (ref 3.5–5.0)
Alkaline Phosphatase: 57 U/L (ref 38–126)
Anion gap: 16 — ABNORMAL HIGH (ref 5–15)
BILIRUBIN TOTAL: 0.7 mg/dL (ref 0.3–1.2)
BUN: 108 mg/dL — AB (ref 6–20)
CO2: 13 mmol/L — ABNORMAL LOW (ref 22–32)
CREATININE: 5.68 mg/dL — AB (ref 0.61–1.24)
Calcium: 8.5 mg/dL — ABNORMAL LOW (ref 8.9–10.3)
Chloride: 102 mmol/L (ref 101–111)
GFR calc Af Amer: 12 mL/min — ABNORMAL LOW (ref >60)
GFR calc non Af Amer: 11 mL/min — ABNORMAL LOW (ref >60)
Glucose, Bld: 54 mg/dL — ABNORMAL LOW (ref 65–99)
POTASSIUM: 6.4 mmol/L — AB (ref 3.5–5.1)
Sodium: 131 mmol/L — ABNORMAL LOW (ref 135–145)
TOTAL PROTEIN: 6 g/dL — AB (ref 6.5–8.1)

## 2017-05-01 LAB — I-STAT CG4 LACTIC ACID, ED
LACTIC ACID, VENOUS: 2.58 mmol/L — AB (ref 0.5–1.9)
Lactic Acid, Venous: 2.58 mmol/L (ref 0.5–1.9)

## 2017-05-01 LAB — BASIC METABOLIC PANEL
ANION GAP: 20 — AB (ref 5–15)
BUN: 107 mg/dL — ABNORMAL HIGH (ref 6–20)
CHLORIDE: 104 mmol/L (ref 101–111)
CO2: 8 mmol/L — AB (ref 22–32)
Calcium: 8.4 mg/dL — ABNORMAL LOW (ref 8.9–10.3)
Creatinine, Ser: 5.99 mg/dL — ABNORMAL HIGH (ref 0.61–1.24)
GFR calc non Af Amer: 10 mL/min — ABNORMAL LOW (ref >60)
GFR, EST AFRICAN AMERICAN: 12 mL/min — AB (ref >60)
Glucose, Bld: 108 mg/dL — ABNORMAL HIGH (ref 65–99)
POTASSIUM: 6.6 mmol/L — AB (ref 3.5–5.1)
Sodium: 132 mmol/L — ABNORMAL LOW (ref 135–145)

## 2017-05-01 LAB — URINALYSIS, ROUTINE W REFLEX MICROSCOPIC
BILIRUBIN URINE: NEGATIVE
Glucose, UA: NEGATIVE mg/dL
Ketones, ur: NEGATIVE mg/dL
Nitrite: NEGATIVE
PH: 6 (ref 5.0–8.0)
Protein, ur: 30 mg/dL — AB
SPECIFIC GRAVITY, URINE: 1.015 (ref 1.005–1.030)

## 2017-05-01 LAB — CBC WITH DIFFERENTIAL/PLATELET
BASOS PCT: 0 %
Basophils Absolute: 0 10*3/uL (ref 0.0–0.1)
EOS PCT: 0 %
Eosinophils Absolute: 0 10*3/uL (ref 0.0–0.7)
HCT: 33.9 % — ABNORMAL LOW (ref 39.0–52.0)
Hemoglobin: 11.8 g/dL — ABNORMAL LOW (ref 13.0–17.0)
LYMPHS ABS: 1.9 10*3/uL (ref 0.7–4.0)
Lymphocytes Relative: 8 %
MCH: 30.1 pg (ref 26.0–34.0)
MCHC: 34.8 g/dL (ref 30.0–36.0)
MCV: 86.5 fL (ref 78.0–100.0)
MONOS PCT: 3 %
Monocytes Absolute: 0.7 10*3/uL (ref 0.1–1.0)
NEUTROS ABS: 20.7 10*3/uL — AB (ref 1.7–7.7)
Neutrophils Relative %: 89 %
Platelets: 119 10*3/uL — ABNORMAL LOW (ref 150–400)
RBC: 3.92 MIL/uL — AB (ref 4.22–5.81)
RDW: 14.2 % (ref 11.5–15.5)
WBC: 23.3 10*3/uL — AB (ref 4.0–10.5)

## 2017-05-01 LAB — URINALYSIS, MICROSCOPIC (REFLEX): Squamous Epithelial / LPF: NONE SEEN

## 2017-05-01 LAB — LIPASE, BLOOD: Lipase: 25 U/L (ref 11–51)

## 2017-05-01 LAB — LACTIC ACID, PLASMA
LACTIC ACID, VENOUS: 6.5 mmol/L — AB (ref 0.5–1.9)
Lactic Acid, Venous: 7.8 mmol/L (ref 0.5–1.9)

## 2017-05-01 LAB — CK: Total CK: 36 U/L — ABNORMAL LOW (ref 49–397)

## 2017-05-01 LAB — CBG MONITORING, ED
Glucose-Capillary: 61 mg/dL — ABNORMAL LOW (ref 65–99)
Glucose-Capillary: 152 mg/dL — ABNORMAL HIGH (ref 65–99)

## 2017-05-01 MED ORDER — PANTOPRAZOLE SODIUM 40 MG PO TBEC
40.0000 mg | DELAYED_RELEASE_TABLET | Freq: Every day | ORAL | Status: DC
  Administered 2017-05-01 – 2017-05-18 (×17): 40 mg via ORAL
  Filled 2017-05-01 (×18): qty 1

## 2017-05-01 MED ORDER — VANCOMYCIN HCL IN DEXTROSE 1-5 GM/200ML-% IV SOLN: 1000.0000 mg | Freq: Once | INTRAVENOUS | Status: DC

## 2017-05-01 MED ORDER — ENOXAPARIN SODIUM 40 MG/0.4ML ~~LOC~~ SOLN: 40.0000 mg | SUBCUTANEOUS | Status: DC

## 2017-05-01 MED ORDER — DEXAMETHASONE 2 MG PO TABS
3.0000 mg | ORAL_TABLET | Freq: Three times a day (TID) | ORAL | Status: DC
  Administered 2017-05-01 – 2017-05-02 (×2): 3 mg via ORAL
  Filled 2017-05-01 (×3): qty 1.5

## 2017-05-01 MED ORDER — ALBUTEROL (5 MG/ML) CONTINUOUS INHALATION SOLN
10.0000 mg/h | INHALATION_SOLUTION | Freq: Once | RESPIRATORY_TRACT | Status: AC
  Administered 2017-05-01: 10 mg/h via RESPIRATORY_TRACT

## 2017-05-01 MED ORDER — CEFEPIME IV (FOR PTA / DISCHARGE USE ONLY): 2.0000 g | Freq: Two times a day (BID) | INTRAVENOUS | Status: DC

## 2017-05-01 MED ORDER — AMIODARONE HCL 200 MG PO TABS
200.0000 mg | ORAL_TABLET | Freq: Every day | ORAL | Status: DC
  Administered 2017-05-01 – 2017-06-10 (×39): 200 mg via ORAL
  Filled 2017-05-01 (×42): qty 1

## 2017-05-01 MED ORDER — SODIUM CHLORIDE 0.9 % IV SOLN
1.0000 g | Freq: Once | INTRAVENOUS | Status: AC
  Administered 2017-05-01: 1 g via INTRAVENOUS
  Filled 2017-05-01: qty 10

## 2017-05-01 MED ORDER — DILTIAZEM HCL 30 MG PO TABS
30.0000 mg | ORAL_TABLET | Freq: Three times a day (TID) | ORAL | Status: DC
  Administered 2017-05-01 – 2017-05-04 (×8): 30 mg via ORAL
  Filled 2017-05-01 (×8): qty 1

## 2017-05-01 MED ORDER — INSULIN ASPART 100 UNIT/ML ~~LOC~~ SOLN: 10.0000 [IU] | Freq: Three times a day (TID) | SUBCUTANEOUS | Status: DC

## 2017-05-01 MED ORDER — SODIUM CHLORIDE 0.9 % IV BOLUS
1000.0000 mL | Freq: Once | INTRAVENOUS | Status: AC
  Administered 2017-05-01: 1000 mL via INTRAVENOUS

## 2017-05-01 MED ORDER — ONDANSETRON HCL 4 MG/2ML IJ SOLN
4.0000 mg | Freq: Once | INTRAMUSCULAR | Status: AC
  Administered 2017-05-01: 4 mg via INTRAVENOUS
  Filled 2017-05-01: qty 2

## 2017-05-01 MED ORDER — TRAZODONE HCL 50 MG PO TABS
50.0000 mg | ORAL_TABLET | Freq: Every evening | ORAL | Status: DC | PRN
  Administered 2017-05-04: 50 mg via ORAL
  Filled 2017-05-01: qty 1

## 2017-05-01 MED ORDER — SODIUM POLYSTYRENE SULFONATE 15 GM/60ML PO SUSP
45.0000 g | ORAL | Status: AC
  Administered 2017-05-01 – 2017-05-02 (×3): 45 g via ORAL
  Filled 2017-05-01 (×3): qty 180

## 2017-05-01 MED ORDER — PIPERACILLIN-TAZOBACTAM 3.375 G IVPB 30 MIN
3.3750 g | Freq: Once | INTRAVENOUS | Status: AC
  Administered 2017-05-01: 3.375 g via INTRAVENOUS
  Filled 2017-05-01: qty 50

## 2017-05-01 MED ORDER — BISACODYL 5 MG PO TBEC
5.0000 mg | DELAYED_RELEASE_TABLET | Freq: Every day | ORAL | Status: DC | PRN
Start: 2017-05-01 — End: 2017-06-12
  Administered 2017-06-01: 5 mg via ORAL
  Filled 2017-05-01 (×2): qty 1

## 2017-05-01 MED ORDER — LORAZEPAM 0.5 MG PO TABS
0.5000 mg | ORAL_TABLET | Freq: Four times a day (QID) | ORAL | Status: DC | PRN
  Administered 2017-05-04 – 2017-06-02 (×2): 0.5 mg via ORAL
  Filled 2017-05-01 (×2): qty 1

## 2017-05-01 MED ORDER — ACETAMINOPHEN 325 MG PO TABS
650.0000 mg | ORAL_TABLET | ORAL | Status: DC | PRN
  Filled 2017-05-01: qty 2

## 2017-05-01 MED ORDER — VANCOMYCIN HCL 10 G IV SOLR
2000.0000 mg | Freq: Once | INTRAVENOUS | Status: AC
  Administered 2017-05-01: 2000 mg via INTRAVENOUS
  Filled 2017-05-01: qty 2000

## 2017-05-01 MED ORDER — CALCIUM GLUCONATE 10 % IV SOLN
1.0000 g | Freq: Once | INTRAVENOUS | Status: DC
  Filled 2017-05-01: qty 10

## 2017-05-01 MED ORDER — SODIUM CHLORIDE 0.9 % IV SOLN
1.0000 g | Freq: Every day | INTRAVENOUS | Status: AC
  Administered 2017-05-01 – 2017-05-16 (×16): 1 g via INTRAVENOUS
  Filled 2017-05-01 (×17): qty 1

## 2017-05-01 MED ORDER — ALBUTEROL SULFATE (2.5 MG/3ML) 0.083% IN NEBU
10.0000 mg | INHALATION_SOLUTION | Freq: Once | RESPIRATORY_TRACT | Status: DC
  Filled 2017-05-01: qty 12

## 2017-05-01 MED ORDER — ONDANSETRON HCL 4 MG PO TABS
4.0000 mg | ORAL_TABLET | Freq: Four times a day (QID) | ORAL | Status: DC | PRN
  Administered 2017-05-01 – 2017-05-02 (×2): 4 mg via ORAL
  Filled 2017-05-01 (×2): qty 1

## 2017-05-01 MED ORDER — METHOCARBAMOL 500 MG PO TABS
500.0000 mg | ORAL_TABLET | Freq: Four times a day (QID) | ORAL | Status: DC | PRN
Start: 2017-05-01 — End: 2017-06-11
  Filled 2017-05-01: qty 1

## 2017-05-01 MED ORDER — DEXTROSE 50 % IV SOLN
1.0000 | Freq: Once | INTRAVENOUS | Status: AC
  Administered 2017-05-01: 50 mL via INTRAVENOUS
  Filled 2017-05-01: qty 50

## 2017-05-01 NOTE — Progress Notes (Signed)
CRITICAL VALUE ALERT  Critical Value:  Latic Acid 7.8 K 6.6  Date & Time Notied:  04/30/2017 8:24 PM   Provider Notified: Reather Laurence NP  Orders Received/Actions taken: no orders given at this time

## 2017-05-01 NOTE — Treatment Plan (Signed)
Urology called to eval nondraining catheter. Bedside ultrasound confirmed Foley in correct position within distended bladder. Foley flushed with 60cc saline and was able to remove a small piece of tissue, likely occluding the catheter. 2L drained after reconnecting Foley to bag.   - No further urologic eval/treatment needed.  - Keep Foley to drain, flush with 60cc daily to prevent sediment clogging the catheter.  - Care to diagnose and treat post-obstruction diuresis

## 2017-05-01 NOTE — ED Notes (Signed)
ED TO INPATIENT HANDOFF REPORT  Name/Age/Gender Dylan [name] 52 y.o. male  Code Status    Code Status Orders  (From admission, onward)        Start     Ordered   04/11/2017 1754  Do not attempt resuscitation (DNR)  Continuous    Question Answer Comment  In the event of cardiac or respiratory ARREST Do not call a "code blue"   In the event of cardiac or respiratory ARREST Do not perform Intubation, CPR, defibrillation or ACLS   In the event of cardiac or respiratory ARREST Use medication by any route, position, wound care, and other measures to relive pain and suffering. May use oxygen, suction and manual treatment of airway obstruction as needed for comfort.      05/02/2017 1753    Code Status History    Date Active Date Inactive Code Status Order ID Comments User Context   04/12/2017 0423 04/13/2017 1642 DNR [id number]  Traci Sermon, PA-C ED   04/05/2017 1433 04/09/2017 2321 DNR [id number]  Fuller Canada, PA-C Inpatient   03/21/2017 2329 04/05/2017 1433 Full Code [id number]  Vianne Bulls, MD ED      Home/SNF/Other Nursing Home  Chief Complaint Hypoglycemia   Level of Care/Admitting Diagnosis ED Disposition    ED Disposition Condition [facility]: [facility] [[id number]]  Level of Care: Med-Surg [16]  Diagnosis: AKI (acute kidney injury) [facility]) [[id number]]  Admitting Physician: Georgette Shell [[id number]]  Attending Physician: Georgette Shell [[id number]]  Estimated length of stay: 3 - 4 days  Certification:: I certify this patient will need inpatient services for at least 2 midnights  PT Class (Do Not Modify): Inpatient [101]  PT Acc Code (Do Not Modify): Private [1]       Medical History Past Medical History:  Diagnosis Date  . 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 Diabetes mellitus without complication ([place])   . Hypertension   . MS (congenital mitral stenosis) 2019  . Persistent atrial fibrillation with rapid  ventricular response ([place]) 03/27/2017    Allergies No Known Allergies  IV Location/Drains/Wounds Patient Lines/Drains/Airways Status   Active Line/Drains/Airways    Name:   Placement date:   Placement time:   Site:   Days:   PICC Single Lumen 04/03/17 PICC Right Cephalic 44 cm 0 cm   97/41/63    8453    Cephalic   28   Urethral Catheter Leslie Coude 20 Fr.   04/20/2017    1530    Coude   less than 1   Incision (Closed) 03/22/17 Back Lower   03/22/17    1751     40   Incision (Closed) 03/25/17 Neck   03/25/17    1543     37   Pressure Injury 04/01/17 Stage II -  Partial thickness loss of dermis presenting as a shallow open ulcer with a red, pink wound bed without slough. four 1cmx1cm areas of redness stage 2   04/01/17    1900     30          Labs/Imaging Results for orders placed or performed during the hospital encounter of 04/18/2017 (from the past 48 hour(s))  CBG monitoring, ED     Status: Abnormal   Collection Time: 04/03/2017  8:46 AM  Result Value Ref Range   Glucose-Capillary 61 (L) 65 - 99 mg/dL  Urinalysis, Routine w reflex microscopic     Status: Abnormal  Collection Time: 04/23/2017  9:20 AM  Result Value Ref Range   Color, Urine YELLOW YELLOW   APPearance TURBID (A) CLEAR   Specific Gravity, Urine 1.015 1.005 - 1.030   pH 6.0 5.0 - 8.0   Glucose, UA NEGATIVE NEGATIVE mg/dL   Hgb urine dipstick LARGE (A) NEGATIVE   Bilirubin Urine NEGATIVE NEGATIVE   Ketones, ur NEGATIVE NEGATIVE mg/dL   Protein, ur 30 (A) NEGATIVE mg/dL   Nitrite NEGATIVE NEGATIVE   Leukocytes, UA MODERATE (A) NEGATIVE    Comment: Performed at [facility] [address], [place], [place] [address]  Urinalysis, Microscopic (reflex)     Status: Abnormal   Collection Time: 04/06/2017  9:20 AM  Result Value Ref Range   RBC / HPF 6-30 0 - 5 RBC/hpf   WBC, UA TOO NUMEROUS TO COUNT 0 - 5 WBC/hpf   Bacteria, UA FEW (A) NONE SEEN   Squamous Epithelial / LPF NONE SEEN NONE SEEN   Hyphae  Yeast PRESENT     Comment: Performed at [facility], [place] [address] [place], [place] [address]  CBC with Differential     Status: Abnormal   Collection Time: 05/02/2017  9:46 AM  Result Value Ref Range   WBC 23.3 (H) 4.0 - 10.5 K/uL   RBC 3.92 (L) 4.22 - 5.81 MIL/uL   Hemoglobin 11.8 (L) 13.0 - 17.0 g/dL   HCT 33.9 (L) 39.0 - 52.0 %   MCV 86.5 78.0 - 100.0 fL   MCH 30.1 26.0 - 34.0 pg   MCHC 34.8 30.0 - 36.0 g/dL   RDW 14.2 11.5 - 15.5 %   Platelets 119 (L) 150 - 400 K/uL    Comment: RESULT REPEATED AND VERIFIED SPECIMEN CHECKED FOR CLOTS PLATELET COUNT CONFIRMED BY SMEAR    Neutrophils Relative % 89 %   Lymphocytes Relative 8 %   Monocytes Relative 3 %   Eosinophils Relative 0 %   Basophils Relative 0 %   Neutro Abs 20.7 (H) 1.7 - 7.7 K/uL   Lymphs Abs 1.9 0.7 - 4.0 K/uL   Monocytes Absolute 0.7 0.1 - 1.0 K/uL   Eosinophils Absolute 0.0 0.0 - 0.7 K/uL   Basophils Absolute 0.0 0.0 - 0.1 K/uL   Smear Review MORPHOLOGY UNREMARKABLE     Comment: Performed at [facility], [place] [address], [place],  [address]  Comprehensive metabolic panel     Status: Abnormal   Collection Time: 05/02/2017  9:46 AM  Result Value Ref Range   Sodium 131 (L) 135 - 145 mmol/L   Potassium 6.4 (HH) 3.5 - 5.1 mmol/L    Comment: CRITICAL RESULT CALLED TO, READ BACK BY AND VERIFIED WITH: P DOWD,RN 04/08/2017 1201 RHOLMES    Chloride 102 101 - 111 mmol/L   CO2 13 (L) 22 - 32 mmol/L   Glucose, Bld 54 (L) 65 - 99 mg/dL   BUN 108 (H) 6 - 20 mg/dL    Comment: RESULTS CONFIRMED BY MANUAL DILUTION   Creatinine, Ser 5.68 (H) 0.61 - 1.24 mg/dL   Calcium 8.5 (L) 8.9 - 10.3 mg/dL   Total Protein 6.0 (L) 6.5 - 8.1 g/dL   Albumin 2.4 (L) 3.5 - 5.0 g/dL   AST 20 15 - 41 U/L   ALT 35 17 - 63 U/L   Alkaline Phosphatase 57 38 - 126 U/L   Total Bilirubin 0.7 0.3 - 1.2 mg/dL   GFR calc non Af Amer 11 (L) >60 mL/min  GFR calc Af Amer 12 (L) >60 mL/min    Comment:  (NOTE) The eGFR has been calculated using the CKD EPI equation. This calculation has not been validated in all clinical situations. eGFR's persistently <60 mL/min signify possible Chronic Kidney Disease.    Anion gap 16 (H) 5 - 15    Comment: Performed at [facility], [place] [address], [place], [place] [address]  Lipase, blood     Status: None   Collection Time: 04/04/2017  9:46 AM  Result Value Ref Range   Lipase 25 11 - 51 U/L    Comment: Performed at [facility], [place] [address], [place], [place] [address]  CK     Status: Abnormal   Collection Time: 04/07/2017  9:46 AM  Result Value Ref Range   Total CK 36 (L) 49 - 397 U/L    Comment: Performed at [facility], [place] [address], [place], [place] [address]  I-Stat CG4 Lactic Acid, ED     Status: Abnormal   Collection Time: 04/07/2017  9:53 AM  Result Value Ref Range   Lactic Acid, Venous 2.58 (HH) 0.5 - 1.9 mmol/L   Comment NOTIFIED PHYSICIAN   CBG monitoring, ED     Status: Abnormal   Collection Time: 04/05/2017 10:11 AM  Result Value Ref Range   Glucose-Capillary 152 (H) 65 - 99 mg/dL  I-Stat CG4 Lactic Acid, ED     Status: Abnormal   Collection Time: 04/26/2017 11:52 AM  Result Value Ref Range   Lactic Acid, Venous 2.58 (HH) 0.5 - 1.9 mmol/L   Comment NOTIFIED PHYSICIAN    Ct Abdomen Pelvis Wo Contrast  Result Date: 04/20/2017 CLINICAL DATA:  Nausea and vomiting. Abdominal pain. Evaluate for spinal infection. Elevated lactic acid. EXAM: CT CHEST, ABDOMEN AND PELVIS WITHOUT CONTRAST TECHNIQUE: Multidetector CT imaging of the chest, abdomen and pelvis was performed following the standard protocol without IV contrast. COMPARISON:  Chest radiograph of earlier today.  No prior CTs. FINDINGS: CT CHEST FINDINGS Cardiovascular: Multifactorial degradation, including arm position, minimal motion, lack of IV contrast and lack of oral contrast in the abdomen or pelvis. A right-sided PICC line  terminates at the superior caval/atrial junction. Tortuous thoracic aorta. Normal heart size, without pericardial effusion. Lad coronary artery atherosclerosis on image 32/2. Mediastinum/Nodes: No mediastinal or definite hilar adenopathy, given limitations of unenhanced CT. Lungs/Pleura: Trace right pleural fluid or thickening. Subsegmental atelectasis at both lung bases dependently. Musculoskeletal: No acute osseous abnormality. Lower cervical spine fixation. CT ABDOMEN PELVIS FINDINGS Hepatobiliary: Degradation continuing into the abdomen, including EKG wire and lead artifact. Grossly normal liver and gallbladder, without biliary duct dilatation. Pancreas: Normal, without mass or ductal dilatation. Spleen: Normal in size, without focal abnormality. Adrenals/Urinary Tract: Normal adrenal glands. No renal calculi. Minimal bilateral caliectasis is favored to be physiologic in the setting of bladder distension. There is symmetric mild bilateral perirenal edema. Bladder distension, despite Foley catheter in place. Stomach/Bowel: Normal stomach, without wall thickening. Large amount of right-sided colonic stool. Normal terminal ileum. Appendix not visualized. Grossly normal small bowel. Vascular/Lymphatic: Aortic and branch vessel atherosclerosis. No abdominopelvic adenopathy. Reproductive: Normal prostate. Other: No significant free fluid.  No free intraperitoneal air. Musculoskeletal: No acute osseous abnormality. Lumbosacral spondylosis. IMPRESSION: 1. Multifactorial moderate degradation, including lack of oral/IV contrast, motion, arm position, EKG wires and leads. 2. Given these limitations, no acute findings. 3. Bladder distension, despite catheter in place. Correlate with catheter function. Mild bilateral caliectasis is favored to be secondary.  4. Nonspecific symmetric bilateral perirenal edema. 5. Age advanced coronary artery atherosclerosis. Recommend assessment of coronary risk factors and consideration of  medical therapy. 6.  Aortic Atherosclerosis (ICD10-I70.0). 7.  Possible constipation. Electronically Signed   By: Abigail Miyamoto M.D.   On: 04/07/2017 13:24   Ct Chest Wo Contrast  Result Date: 04/09/2017 CLINICAL DATA:  Nausea and vomiting. Abdominal pain. Evaluate for spinal infection. Elevated lactic acid. EXAM: CT CHEST, ABDOMEN AND PELVIS WITHOUT CONTRAST TECHNIQUE: Multidetector CT imaging of the chest, abdomen and pelvis was performed following the standard protocol without IV contrast. COMPARISON:  Chest radiograph of earlier today.  No prior CTs. FINDINGS: CT CHEST FINDINGS Cardiovascular: Multifactorial degradation, including arm position, minimal motion, lack of IV contrast and lack of oral contrast in the abdomen or pelvis. A right-sided PICC line terminates at the superior caval/atrial junction. Tortuous thoracic aorta. Normal heart size, without pericardial effusion. Lad coronary artery atherosclerosis on image 32/2. Mediastinum/Nodes: No mediastinal or definite hilar adenopathy, given limitations of unenhanced CT. Lungs/Pleura: Trace right pleural fluid or thickening. Subsegmental atelectasis at both lung bases dependently. Musculoskeletal: No acute osseous abnormality. Lower cervical spine fixation. CT ABDOMEN PELVIS FINDINGS Hepatobiliary: Degradation continuing into the abdomen, including EKG wire and lead artifact. Grossly normal liver and gallbladder, without biliary duct dilatation. Pancreas: Normal, without mass or ductal dilatation. Spleen: Normal in size, without focal abnormality. Adrenals/Urinary Tract: Normal adrenal glands. No renal calculi. Minimal bilateral caliectasis is favored to be physiologic in the setting of bladder distension. There is symmetric mild bilateral perirenal edema. Bladder distension, despite Foley catheter in place. Stomach/Bowel: Normal stomach, without wall thickening. Large amount of right-sided colonic stool. Normal terminal ileum. Appendix not visualized.  Grossly normal small bowel. Vascular/Lymphatic: Aortic and branch vessel atherosclerosis. No abdominopelvic adenopathy. Reproductive: Normal prostate. Other: No significant free fluid.  No free intraperitoneal air. Musculoskeletal: No acute osseous abnormality. Lumbosacral spondylosis. IMPRESSION: 1. Multifactorial moderate degradation, including lack of oral/IV contrast, motion, arm position, EKG wires and leads. 2. Given these limitations, no acute findings. 3. Bladder distension, despite catheter in place. Correlate with catheter function. Mild bilateral caliectasis is favored to be secondary. 4. Nonspecific symmetric bilateral perirenal edema. 5. Age advanced coronary artery atherosclerosis. Recommend assessment of coronary risk factors and consideration of medical therapy. 6.  Aortic Atherosclerosis (ICD10-I70.0). 7.  Possible constipation. Electronically Signed   By: Abigail Miyamoto M.D.   On: 04/10/2017 13:24   Ct Cervical Spine Wo Contrast  Result Date: 04/15/2017 CLINICAL DATA:  Elevated WBC.  Evaluate for spinal flexion. EXAM: CT CERVICAL SPINE WITHOUT CONTRAST TECHNIQUE: Multidetector CT imaging of the cervical spine was performed without intravenous contrast. Multiplanar CT image reconstructions were also generated. COMPARISON:  CT cervical spine 04/12/2017 MR cervical spine 04/01/2017 FINDINGS: Alignment: Normal. Skull base and vertebrae: No acute fracture. No primary bone lesion or focal pathologic process. Soft tissues and spinal canal: No prevertebral fluid or swelling. No visible canal hematoma. Mild soft tissue prominence along the right anterolateral aspect of C6-7 vertebral body likely reflecting resolving hematoma, but infection cannot be completely excluded. Hematoma anterior to the right sternocleidomastoid muscle in the subcutaneous fat is partially visualized and decreased in size compared with 04/12/2017. Disc levels: Prior C6 corpectomy with anterior cervical fusion from C5 through C7.  severe right and moderate left foraminal stenosis at C5-6. Moderate right foraminal stenosis at C6-7. Mild bilateral foraminal stenosis at C7-T1 and T1-2. Upper chest: Lung apices are clear. IMPRESSION: 1. Prior C6 corpectomy with anterior cervical fusion from C5  through C7. severe right and moderate left foraminal stenosis at C5-6. Moderate right foraminal stenosis at C6-7. Mild soft tissue prominence along the right anterolateral aspect of C6-7 vertebral body likely reflecting resolving hematoma, but infection cannot be completely excluded. 2. Hematoma anterior to the right sternocleidomastoid muscle in the subcutaneous fat is partially visualized and decreased in size compared with 04/12/2017. Electronically Signed   By: Kathreen Devoid   On: 04/13/2017 13:23   Dg Chest Portable 1 View  Result Date: 04/25/2017 CLINICAL DATA:  Chills.  Acute kidney injury. EXAM: PORTABLE CHEST 1 VIEW COMPARISON:  04/01/2017 FINDINGS: Right upper extremity PICC with tip at the SVC. Normal heart size and mediastinal contours. The lungs are clear and well aerated. No acute osseous finding. IMPRESSION: No evidence of active disease. Electronically Signed   By: Monte Fantasia M.D.   On: 04/11/2017 09:56    Pending Labs Unresulted Labs (From admission, onward)   Start     Ordered   05/08/17 0500  Creatinine, serum  (enoxaparin (LOVENOX)    CrCl >/= 30 ml/min)  Weekly,   R    Comments:  while on enoxaparin therapy    04/30/2017 1753   05/02/17 0017  Basic metabolic panel  Tomorrow morning,   R     04/16/2017 1753   05/02/17 0500  CBC  Tomorrow morning,   R     04/23/2017 1753   04/30/2017 1755  Lactic acid, plasma  STAT Now then every 3 hours,   R     04/26/2017 1754   04/06/2017 4944  Basic metabolic panel  STAT,   R     04/04/2017 1754   04/08/2017 1752  CBC  (enoxaparin (LOVENOX)    CrCl >/= 30 ml/min)  Once,   R    Comments:  Baseline for enoxaparin therapy IF NOT ALREADY DRAWN.  Notify MD if PLT < 100 K.    04/15/2017 1753    04/17/2017 0921  Urine culture  STAT,   STAT     04/29/2017 0921   04/29/2017 0920  Blood culture (routine x 2)  BLOOD CULTURE X 2,   STAT     04/17/2017 0921      Vitals/Pain Today's Vitals   04/13/2017 1004 04/12/2017 1149 05/02/2017 1302 04/07/2017 1419  BP:  138/88  140/90  Pulse:  82  91  Resp:  15  16  Temp: 97.6 F (36.4 C)     TempSrc: Rectal     SpO2:  100% 100% 100%  Weight:      PainSc:        Isolation Precautions No active isolations  Medications Medications  enoxaparin (LOVENOX) injection 40 mg (has no administration in time range)  acetaminophen (TYLENOL) tablet 650 mg (has no administration in time range)  amiodarone (PACERONE) tablet 200 mg (has no administration in time range)  ceFEPime (MAXIPIME) IVPB (has no administration in time range)  diltiazem (CARDIZEM) tablet 30 mg (has no administration in time range)  insulin aspart (NOVOLOG) FlexPen 10 Units (has no administration in time range)  LORazepam (ATIVAN) tablet 0.5 mg (has no administration in time range)  ondansetron (ZOFRAN) tablet 4 mg (has no administration in time range)  pantoprazole (PROTONIX) EC tablet 40 mg (has no administration in time range)  traZODone (DESYREL) tablet 50 mg (has no administration in time range)  dextrose 50 % solution 50 mL (50 mLs Intravenous Given 04/05/2017 0947)  sodium chloride 0.9 % bolus 1,000 mL (0 mLs Intravenous Stopped 04/08/2017  1138)  ondansetron (ZOFRAN) injection 4 mg (4 mg Intravenous Given 04/18/2017 0947)  sodium chloride 0.9 % bolus 1,000 mL (0 mLs Intravenous Stopped 04/10/2017 1407)  piperacillin-tazobactam (ZOSYN) IVPB 3.375 g (0 g Intravenous Stopped 04/03/2017 1407)  calcium gluconate 1 g in sodium chloride 0.9 % 100 mL IVPB (0 g Intravenous Stopped 04/05/2017 1529)  vancomycin (VANCOCIN) 2,000 mg in sodium chloride 0.9 % 500 mL IVPB (0 mg Intravenous Stopped 04/13/2017 1757)  albuterol (PROVENTIL,VENTOLIN) solution continuous neb (10 mg/hr Nebulization Given 04/08/2017 1302)     Mobility non-ambulatory

## 2017-05-01 NOTE — Progress Notes (Signed)
A consult was received from an ED physician for vancomycin and zosyn per pharmacy dosing.  The patient's profile has been reviewed for ht/wt/allergies/indication/available labs.   A one time order has been placed for vancomycin 2g and zosyn 3.375g.    Further antibiotics/pharmacy consults should be ordered by admitting physician if indicated.                       Thank you, Loralee Pacas, PharmD, BCPS 04/20/2017  12:32 PM

## 2017-05-01 NOTE — ED Notes (Signed)
UNSUCCESSFUL BLOOD CULTURE COLLECTION ATTEMPT FROM LEFT [facility]

## 2017-05-01 NOTE — Progress Notes (Signed)
Pharmacy Antibiotic Note  KEALAN BUCHAN is a 52 y.o. male admitted on 04/24/2017 with spinal abscess. Patient began treatment with vancomycin and cefepime on 3/8 for same, (intended to continue through 4/14) but found to have new AKI.  Pharmacy has been consulted for Cefepime dosing.  Plan:  Cefepime 1 g IV q24 hr  ID to see patient tomorrow   Weight: 265 lb 6.4 oz (120.4 kg)  Temp (24hrs), Avg:98 F (36.7 C), Min:97.6 F (36.4 C), Max:98.7 F (37.1 C)  Recent Labs  Lab 04/26/17 04/29/17 04/30/17 04/17/2017 0946 04/04/2017 0953 04/30/2017 1152  WBC 12.3  --   --  23.3*  --   --   CREATININE 1.5* 3.1* 4.4* 5.68*  --   --   LATICACIDVEN  --   --   --   --  2.58* 2.58*    Estimated Creatinine Clearance: 22.3 mL/min (A) (by C-G formula based on SCr of 5.68 mg/dL (H)).    No Known Allergies   Thank you for allowing pharmacy to be a part of this patient's care.  Bernadene Person, PharmD, BCPS [phone] 04/07/2017, 7:25 PM

## 2017-05-01 NOTE — ED Triage Notes (Signed)
Pt arrives via EMS from [facility]. EMS reports facility called for pt reporting abd pain. Upon arrival pts CBG 55. Facility administered glucerna oral. EMS administered 15g of oral glucose. CBG upon arrival 61 mg/dL. Pt is A&OX4. Pt has hx of MS. Pt has PICC in rt upper arm.

## 2017-05-01 NOTE — ED Notes (Signed)
Bed: YO8 Expected date: 04/29/2017 Expected time: 8:36 AM Means of arrival: Ambulance Comments: ? Hypoglycemia

## 2017-05-01 NOTE — ED Provider Notes (Signed)
[facility] DEPT Provider Note   CSN: [id number] Arrival date & time: 04/25/2017  3267     History   Chief Complaint Chief Complaint  Patient presents with  . Abdominal Pain  . Hypoglycemia    HPI Dylan [name] is a 52 y.o. male.  The history is provided by the patient and medical records. No language interpreter was used.  Abdominal Pain   This is a new problem. The current episode started 2 days ago. The problem occurs constantly. The problem has not changed since onset.Associated with: vomiting. The pain is located in the generalized abdominal region. The quality of the pain is aching and cramping. The pain is moderate. Associated symptoms include anorexia, diarrhea, nausea and vomiting. Pertinent negatives include fever, constipation, dysuria, frequency, hematuria and headaches. The symptoms are aggravated by palpation and vomiting. Nothing relieves the symptoms.  Hypoglycemia  Associated symptoms: vomiting and weakness   Associated symptoms: no dizziness, no seizures, no shortness of breath and no sweats     Past Medical History:  Diagnosis Date  . 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 Diabetes mellitus without complication ([place])   . Hypertension   . MS (congenital mitral stenosis) 2019  . Persistent atrial fibrillation with rapid ventricular response ([place]) 03/27/2017    Patient Active Problem List   Diagnosis Date Noted  . Adjustment reaction with anxiety and depression 04/30/2017  . Neurogenic bladder 04/22/2017  . Insomnia 04/22/2017  . GERD without esophagitis 04/15/2017  . Insulin dependent type 2 diabetes mellitus, uncontrolled ([place]) 04/15/2017  . Hematoma 04/12/2017  . Hyperkalemia 04/08/2017  . AKI (acute kidney injury) ([place]) 04/08/2017  . Pressure injury of skin 04/03/2017  . Multiple sclerosis exacerbation ([place]) 03/30/2017  . Persistent atrial fibrillation with rapid ventricular response ([place]) 03/27/2017  . Paroxysmal atrial  fibrillation ([facility])   . Spinal stenosis, multiple sites in spine 03/23/2017  . Morbid obesity ([place]) 03/22/2017  . Uncontrolled diabetes mellitus type 2 without complications ([place]) 12/45/8099  . Hypertension 03/21/2017    Past Surgical History:  Procedure Laterality Date  . ANTERIOR CERVICAL DECOMP/DISCECTOMY FUSION N/A 03/25/2017   Procedure: ANTERIOR CERVICAL DECOMPRESSION/DISCECTOMY FUSION CERVICAL FIVE- CERVICAL SIX, CERVICAL SIX- CERVICAL SEVEN, CORPECTOMY;  Surgeon: Consuella Lose, MD;  Location: [facility];  Service: Neurosurgery;  Laterality: N/A;  . NOSE SURGERY     AS A CHILD        Home Medications    Prior to Admission medications   Medication Sig Start Date End Date Taking? Authorizing Provider  acetaminophen (TYLENOL) 325 MG tablet Take 650 mg by mouth every 4 (four) hours as needed. 04/09/17   [provider]  amiodarone (PACERONE) 200 MG tablet Take 1 tablet (200 mg total) by mouth daily. 04/10/17   Cherene Altes, MD  b complex vitamins tablet Take 1 tablet by mouth daily.    [provider]  bisacodyl (DULCOLAX) 5 MG EC tablet Take 1 tablet (5 mg total) by mouth daily as needed for moderate constipation. 04/09/17   Cherene Altes, MD  ceFEPime (MAXIPIME) IVPB Inject 2 g into the vein every 12 (twelve) hours. Indication:  Spinal abscess Last Day of Therapy:  05/16/2017 Labs - Once weekly:  CBC/D and BMP, Labs - Every other week:  ESR and CRP 04/09/17   Cherene Altes, MD  collagenase (SANTYL) ointment Apply 1 application topically daily. Apply to right buttock topically every day shift for wound care    [provider]  dexamethasone (DECADRON) 1.5  MG tablet Take 2 tablets (3 mg total) by mouth every 8 (eight) hours. 04/09/17   Cherene Altes, MD  diltiazem (CARDIZEM) 30 MG tablet Take 1 tablet (30 mg total) by mouth every 8 (eight) hours. 04/09/17   Cherene Altes, MD  GLUCERNA [facility]) LIQD Glucerna 1.5 - Give 240 ml three times  daily 04/09/17   [provider]  insulin aspart (NOVOLOG FLEXPEN) 100 UNIT/ML FlexPen Inject as per sliding scale subcutaneously before meals: 0 - 120 = 0 Units 121 - 150 = 2 units 151 - 200 = 4 units 201 - 250 = 6 units 251 - 300 = 8 units 301 - 350 = 10 units 351 - 400 = 12 units 401 - 450 = 14 units Notify Provider is less than 60 or greater than 450    [provider]  insulin aspart (NOVOLOG FLEXPEN) 100 UNIT/ML FlexPen Inject 10 Units into the skin 3 (three) times daily with meals.  04/20/17   [provider]  Insulin Glargine (LANTUS SOLOSTAR) 100 UNIT/ML Solostar Pen Inject 70 Units into the skin at bedtime.  04/22/17   [provider]  LORazepam (ATIVAN) 0.5 MG tablet Take 1 tablet (0.5 mg total) by mouth every 6 (six) hours as needed for anxiety. X 2 weeks 04/28/17   Gerlene Fee, NP  metFORMIN (GLUCOPHAGE) 500 MG tablet Take 1,000 mg by mouth daily with breakfast.  04/23/17   [provider]  methocarbamol (ROBAXIN) 500 MG tablet Take 1 tablet (500 mg total) by mouth every 6 (six) hours as needed for muscle spasms. 04/09/17   Cherene Altes, MD  Multiple Vitamin (MULTIVITAMIN) tablet Take 1 tablet by mouth daily. 04/17/17   [provider]  Nutritional Supplements (NUTRITIONAL SUPPLEMENT PO) CCD (Consistent Carbohydrates) diet - Regular texture, Regular / thin consistency. Yogurt to all trays    [provider]  ondansetron (ZOFRAN) 4 MG tablet Take 1 tablet (4 mg total) by mouth every 6 (six) hours as needed for nausea. 04/09/17   Cherene Altes, MD  pantoprazole (PROTONIX) 40 MG tablet Take 1 tablet (40 mg total) by mouth daily. 04/10/17   Cherene Altes, MD  senna-docusate (SENOKOT-S) 8.6-50 MG tablet Take 1 tablet by mouth at bedtime as needed for mild constipation. 04/09/17   Cherene Altes, MD  sertraline (ZOLOFT) 50 MG tablet Take 50 mg by mouth daily. X 1 week then increase yo 142m by mouth daily 04/29/17    [provider]  sodium chloride 0.9 % injection Inject 10 mLs into the vein 2 (two) times daily. Before and after antibiotics    [provider]  vancomycin IVPB Inject 500 mg into the vein every 12 (twelve) hours. Indication:  Spinal abscess Last Day of Therapy:  05/16/2017 Labs - Sunday/Monday:  CBC/D, BMP, and vancomycin trough. Labs - Thursday:  BMP and vancomycin trough Labs - Every other week:  ESR and CRP 04/09/17   MCherene Altes MD  zolpidem (AMBIEN) 10 MG tablet Take 10 mg by mouth at bedtime as needed for sleep. 04/28/17   [provider]    Family History Family History  Problem Relation Age of Onset  . Prostate cancer Father     Social History Social History   Tobacco Use  . Smoking status: Never Smoker  . Smokeless tobacco: Never Used  Substance Use Topics  . Alcohol use: No    Frequency: Never  . Drug use: No     Allergies  Patient has no known allergies.   Review of Systems Review of Systems  Constitutional: Positive for chills and fatigue. Negative for diaphoresis and fever.  HENT: Negative for congestion.   Eyes: Negative for visual disturbance.  Respiratory: Negative for cough, chest tightness, shortness of breath, wheezing and stridor.   Cardiovascular: Negative for chest pain and palpitations.  Gastrointestinal: Positive for abdominal pain, anorexia, diarrhea, nausea and vomiting. Negative for abdominal distention and constipation.  Genitourinary: Negative for dysuria, flank pain, frequency and hematuria.  Musculoskeletal: Negative for back pain, neck pain and neck stiffness.  Skin: Negative for rash and wound.  Neurological: Positive for weakness. Negative for dizziness, seizures, light-headedness, numbness and headaches.  Psychiatric/Behavioral: Negative for agitation and confusion.  All other systems reviewed and are negative.    Physical Exam Updated Vital Signs BP 135/88 (BP Location: Left Arm)   Temp 98.7  F (37.1 C) (Oral)   Resp 18   Wt 111.6 kg (246 lb)   SpO2 100%   BMI 29.94 kg/m   Physical Exam  Constitutional: He appears well-developed and well-nourished.  Non-toxic appearance. He does not appear ill. No distress.  HENT:  Head: Normocephalic.  Mouth/Throat: Oropharynx is clear and moist.  Eyes: Pupils are equal, round, and reactive to light. Conjunctivae and EOM are normal.  Neck: Normal range of motion. Neck supple.  Cardiovascular: Normal rate and intact distal pulses.  No murmur heard. Pulmonary/Chest: Effort normal and breath sounds normal. No stridor. No respiratory distress. He has no wheezes. He has no rales. He exhibits no tenderness.  Abdominal: Soft. Bowel sounds are normal. He exhibits no distension and no mass. There is tenderness. There is no rebound.  Musculoskeletal: Normal range of motion. He exhibits no edema or tenderness.  Neurological: He is alert. No sensory deficit. He exhibits abnormal muscle tone.  Skin: Capillary refill takes less than 2 seconds. He is not diaphoretic. No pallor.  Psychiatric: He has a normal mood and affect.  Nursing note and vitals reviewed.    ED Treatments / Results  Labs (all labs ordered are listed, but only abnormal results are displayed) Labs Reviewed  CBC WITH DIFFERENTIAL/PLATELET - Abnormal; Notable for the following components:      Result Value   WBC 23.3 (*)    RBC 3.92 (*)    Hemoglobin 11.8 (*)    HCT 33.9 (*)    Platelets 119 (*)    Neutro Abs 20.7 (*)    All other components within normal limits  COMPREHENSIVE METABOLIC PANEL - Abnormal; Notable for the following components:   Sodium 131 (*)    Potassium 6.4 (*)    CO2 13 (*)    Glucose, Bld 54 (*)    BUN 108 (*)    Creatinine, Ser 5.68 (*)    Calcium 8.5 (*)    Total Protein 6.0 (*)    Albumin 2.4 (*)    GFR calc non Af Amer 11 (*)    GFR calc Af Amer 12 (*)    Anion gap 16 (*)    All other components within normal limits  URINALYSIS, ROUTINE W  REFLEX MICROSCOPIC - Abnormal; Notable for the following components:   APPearance TURBID (*)    Hgb urine dipstick LARGE (*)    Protein, ur 30 (*)    Leukocytes, UA MODERATE (*)    All other components within normal limits  CK - Abnormal; Notable for the following components:   Total CK 36 (*)    All other  components within normal limits  URINALYSIS, MICROSCOPIC (REFLEX) - Abnormal; Notable for the following components:   Bacteria, UA FEW (*)    All other components within normal limits  CBG MONITORING, ED - Abnormal; Notable for the following components:   Glucose-Capillary 61 (*)    All other components within normal limits  I-STAT CG4 LACTIC ACID, ED - Abnormal; Notable for the following components:   Lactic Acid, Venous 2.58 (*)    All other components within normal limits  CBG MONITORING, ED - Abnormal; Notable for the following components:   Glucose-Capillary 152 (*)    All other components within normal limits  I-STAT CG4 LACTIC ACID, ED - Abnormal; Notable for the following components:   Lactic Acid, Venous 2.58 (*)    All other components within normal limits  CULTURE, BLOOD (ROUTINE X 2)  CULTURE, BLOOD (ROUTINE X 2)  URINE CULTURE  LIPASE, BLOOD    EKG EKG Interpretation  Date/Time:  Saturday May 01 2017 10:01:02 EDT Ventricular Rate:  88 PR Interval:    QRS Duration: 97 QT Interval:  369 QTC Calculation: 447 R Axis:   48 Text Interpretation:  Sinus rhythm Abnormal R-wave progression, early transition Baseline wander in lead(s) V3 when compared to prior, sharper t waves.  wandering baseline,  No STEMI Confirmed by Antony Blackbird [phone]) on 04/30/2017 11:17:19 AM   Radiology Ct Abdomen Pelvis Wo Contrast  Result Date: 04/28/2017 CLINICAL DATA:  Nausea and vomiting. Abdominal pain. Evaluate for spinal infection. Elevated lactic acid. EXAM: CT CHEST, ABDOMEN AND PELVIS WITHOUT CONTRAST TECHNIQUE: Multidetector CT imaging of the chest, abdomen and pelvis was  performed following the standard protocol without IV contrast. COMPARISON:  Chest radiograph of earlier today.  No prior CTs. FINDINGS: CT CHEST FINDINGS Cardiovascular: Multifactorial degradation, including arm position, minimal motion, lack of IV contrast and lack of oral contrast in the abdomen or pelvis. A right-sided PICC line terminates at the superior caval/atrial junction. Tortuous thoracic aorta. Normal heart size, without pericardial effusion. Lad coronary artery atherosclerosis on image 32/2. Mediastinum/Nodes: No mediastinal or definite hilar adenopathy, given limitations of unenhanced CT. Lungs/Pleura: Trace right pleural fluid or thickening. Subsegmental atelectasis at both lung bases dependently. Musculoskeletal: No acute osseous abnormality. Lower cervical spine fixation. CT ABDOMEN PELVIS FINDINGS Hepatobiliary: Degradation continuing into the abdomen, including EKG wire and lead artifact. Grossly normal liver and gallbladder, without biliary duct dilatation. Pancreas: Normal, without mass or ductal dilatation. Spleen: Normal in size, without focal abnormality. Adrenals/Urinary Tract: Normal adrenal glands. No renal calculi. Minimal bilateral caliectasis is favored to be physiologic in the setting of bladder distension. There is symmetric mild bilateral perirenal edema. Bladder distension, despite Foley catheter in place. Stomach/Bowel: Normal stomach, without wall thickening. Large amount of right-sided colonic stool. Normal terminal ileum. Appendix not visualized. Grossly normal small bowel. Vascular/Lymphatic: Aortic and branch vessel atherosclerosis. No abdominopelvic adenopathy. Reproductive: Normal prostate. Other: No significant free fluid.  No free intraperitoneal air. Musculoskeletal: No acute osseous abnormality. Lumbosacral spondylosis. IMPRESSION: 1. Multifactorial moderate degradation, including lack of oral/IV contrast, motion, arm position, EKG wires and leads. 2. Given these  limitations, no acute findings. 3. Bladder distension, despite catheter in place. Correlate with catheter function. Mild bilateral caliectasis is favored to be secondary. 4. Nonspecific symmetric bilateral perirenal edema. 5. Age advanced coronary artery atherosclerosis. Recommend assessment of coronary risk factors and consideration of medical therapy. 6.  Aortic Atherosclerosis (ICD10-I70.0). 7.  Possible constipation. Electronically Signed   By: Abigail Miyamoto M.D.   On: 04/21/2017  13:24   Ct Chest Wo Contrast  Result Date: 04/29/2017 CLINICAL DATA:  Nausea and vomiting. Abdominal pain. Evaluate for spinal infection. Elevated lactic acid. EXAM: CT CHEST, ABDOMEN AND PELVIS WITHOUT CONTRAST TECHNIQUE: Multidetector CT imaging of the chest, abdomen and pelvis was performed following the standard protocol without IV contrast. COMPARISON:  Chest radiograph of earlier today.  No prior CTs. FINDINGS: CT CHEST FINDINGS Cardiovascular: Multifactorial degradation, including arm position, minimal motion, lack of IV contrast and lack of oral contrast in the abdomen or pelvis. A right-sided PICC line terminates at the superior caval/atrial junction. Tortuous thoracic aorta. Normal heart size, without pericardial effusion. Lad coronary artery atherosclerosis on image 32/2. Mediastinum/Nodes: No mediastinal or definite hilar adenopathy, given limitations of unenhanced CT. Lungs/Pleura: Trace right pleural fluid or thickening. Subsegmental atelectasis at both lung bases dependently. Musculoskeletal: No acute osseous abnormality. Lower cervical spine fixation. CT ABDOMEN PELVIS FINDINGS Hepatobiliary: Degradation continuing into the abdomen, including EKG wire and lead artifact. Grossly normal liver and gallbladder, without biliary duct dilatation. Pancreas: Normal, without mass or ductal dilatation. Spleen: Normal in size, without focal abnormality. Adrenals/Urinary Tract: Normal adrenal glands. No renal calculi. Minimal  bilateral caliectasis is favored to be physiologic in the setting of bladder distension. There is symmetric mild bilateral perirenal edema. Bladder distension, despite Foley catheter in place. Stomach/Bowel: Normal stomach, without wall thickening. Large amount of right-sided colonic stool. Normal terminal ileum. Appendix not visualized. Grossly normal small bowel. Vascular/Lymphatic: Aortic and branch vessel atherosclerosis. No abdominopelvic adenopathy. Reproductive: Normal prostate. Other: No significant free fluid.  No free intraperitoneal air. Musculoskeletal: No acute osseous abnormality. Lumbosacral spondylosis. IMPRESSION: 1. Multifactorial moderate degradation, including lack of oral/IV contrast, motion, arm position, EKG wires and leads. 2. Given these limitations, no acute findings. 3. Bladder distension, despite catheter in place. Correlate with catheter function. Mild bilateral caliectasis is favored to be secondary. 4. Nonspecific symmetric bilateral perirenal edema. 5. Age advanced coronary artery atherosclerosis. Recommend assessment of coronary risk factors and consideration of medical therapy. 6.  Aortic Atherosclerosis (ICD10-I70.0). 7.  Possible constipation. Electronically Signed   By: Abigail Miyamoto M.D.   On: 04/26/2017 13:24   Ct Cervical Spine Wo Contrast  Result Date: 04/18/2017 CLINICAL DATA:  Elevated WBC.  Evaluate for spinal flexion. EXAM: CT CERVICAL SPINE WITHOUT CONTRAST TECHNIQUE: Multidetector CT imaging of the cervical spine was performed without intravenous contrast. Multiplanar CT image reconstructions were also generated. COMPARISON:  CT cervical spine 04/12/2017 MR cervical spine 04/01/2017 FINDINGS: Alignment: Normal. Skull base and vertebrae: No acute fracture. No primary bone lesion or focal pathologic process. Soft tissues and spinal canal: No prevertebral fluid or swelling. No visible canal hematoma. Mild soft tissue prominence along the right anterolateral aspect of  C6-7 vertebral body likely reflecting resolving hematoma, but infection cannot be completely excluded. Hematoma anterior to the right sternocleidomastoid muscle in the subcutaneous fat is partially visualized and decreased in size compared with 04/12/2017. Disc levels: Prior C6 corpectomy with anterior cervical fusion from C5 through C7. severe right and moderate left foraminal stenosis at C5-6. Moderate right foraminal stenosis at C6-7. Mild bilateral foraminal stenosis at C7-T1 and T1-2. Upper chest: Lung apices are clear. IMPRESSION: 1. Prior C6 corpectomy with anterior cervical fusion from C5 through C7. severe right and moderate left foraminal stenosis at C5-6. Moderate right foraminal stenosis at C6-7. Mild soft tissue prominence along the right anterolateral aspect of C6-7 vertebral body likely reflecting resolving hematoma, but infection cannot be completely excluded. 2. Hematoma anterior to the right  sternocleidomastoid muscle in the subcutaneous fat is partially visualized and decreased in size compared with 04/12/2017. Electronically Signed   By: Kathreen Devoid   On: 04/03/2017 13:23   Dg Chest Portable 1 View  Result Date: 04/15/2017 CLINICAL DATA:  Chills.  Acute kidney injury. EXAM: PORTABLE CHEST 1 VIEW COMPARISON:  04/01/2017 FINDINGS: Right upper extremity PICC with tip at the SVC. Normal heart size and mediastinal contours. The lungs are clear and well aerated. No acute osseous finding. IMPRESSION: No evidence of active disease. Electronically Signed   By: Monte Fantasia M.D.   On: 04/12/2017 09:56    Procedures Procedures (including critical care time)  CRITICAL CARE Performed by: Gwenyth Allegra Emrik Erhard Total critical care time: 60 minutes Critical care time was exclusive of separately billable procedures and treating other patients. Critical care was necessary to treat or prevent imminent or life-threatening deterioration. Critical care was time spent personally by me on the  following activities: development of treatment plan with patient and/or surrogate as well as nursing, discussions with consultants, evaluation of patient's response to treatment, examination of patient, obtaining history from patient or surrogate, ordering and performing treatments and interventions, ordering and review of laboratory studies, ordering and review of radiographic studies, pulse oximetry and re-evaluation of patient's condition.   Medications Ordered in ED Medications  vancomycin (VANCOCIN) 2,000 mg in sodium chloride 0.9 % 500 mL IVPB (2,000 mg Intravenous New Bag/Given 04/17/2017 1531)  dextrose 50 % solution 50 mL (50 mLs Intravenous Given 04/23/2017 0947)  sodium chloride 0.9 % bolus 1,000 mL (0 mLs Intravenous Stopped 04/11/2017 1138)  ondansetron (ZOFRAN) injection 4 mg (4 mg Intravenous Given 04/07/2017 0947)  sodium chloride 0.9 % bolus 1,000 mL (0 mLs Intravenous Stopped 04/12/2017 1407)  piperacillin-tazobactam (ZOSYN) IVPB 3.375 g (0 g Intravenous Stopped 05/02/2017 1407)  calcium gluconate 1 g in sodium chloride 0.9 % 100 mL IVPB (0 g Intravenous Stopped 04/11/2017 1529)  albuterol (PROVENTIL,VENTOLIN) solution continuous neb (10 mg/hr Nebulization Given 04/03/2017 1302)     Initial Impression / Assessment and Plan / ED Course  I have reviewed the triage vital signs and the nursing notes.  Pertinent labs & imaging results that were available during my care of the patient were reviewed by me and considered in my medical decision making (see chart for details).     Dylan [name] is a 52 y.o. male with a past medical history significant for recent onset quadriplegia with Foley catheter dependence, cord compression of C5-C7 status post cervical decompression on 03/25/17 and subsequent development of fluid collection infection near the surgical site currently on IV vancomycin and Rocephin, intermittent atrial fibrillation, diabetes, hypertension, multiple sclerosis, and GERD who presents with  severe fatigue, chills, nausea, vomiting, diarrhea, decreased oral intake, hypoglycemia, abdominal cramping with his emesis, and report of worsening kidney injury.  Patient reports over the last few days he has had nausea vomiting and he thinks he has diarrhea although he is not certain due to his quadriplegia.  He reports that he was told he had worsening kidney injury yesterday and was going to have it rechecked today.  Patient was found to have a low glucose in the 50s and was sent to the emergency department.  Patient reports feeling dehydrated.  He does report some abdominal cramping with his vomiting.  He reports that as moderate.  He denies any chest pain, shortness of breath, cough, congestion, or palpitations.  He reports feeling very fatigued.  On exam, patient has moderate abdominal tenderness  diffusely with no focal areas of worsened tenderness.  Lungs are clear.  Chest is nontender.  Patient cannot move his legs but has normal sensation.  Patient has very weak movement of his arms but the grip strength is symmetric.  He reports this is unchanged for the last few weeks.  He has normal extraocular movements and pupils are reactive bilaterally.  Patient is alert and oriented although he is somewhat sleepy.  Based on patient's apical ischemia, he will be given D50 and reassessed.  With the nausea, vomiting, and report of diarrhea I am concerned that dehydration in the setting of his decreased oral intake is contributing to the worsening AK I that the lab work has demonstrated over the last few days.  Patient will be given normal saline fluids.  Patient also be given nausea medicine.  Due to the chills and his symptoms of hypoglycemia, patient will have workup to look for occult infection contributing including a chest x-ray and urinalysis.  Patient will EKG to make sure he is no longer in A. fib with RVR which she has had previously.  Patient is on diltiazem and amiodarone.  Anticipate admission after  workup for AK I in the setting of possible viral gastroenteritis.  12:35 PM Patient's diagnostic testing began to return.  Lactic acid elevated and remained elevated and unchanged.  Leukocytosis present at 23.3.  Mild anemia.  Most concerning is the patient's worsened kidney function with a creatinine of 5.68 which is elevated from 1.2 several weeks ago.  Patient also has elevated potassium of 6.4.  Patient will be given calcium gluconate and albuterol to try and help with the potassium.  Nephrology will be called.  Due to his intermittent hypoglycemia, will hold on the insulin.  Other laboratory testing showed normal lipase and CK was not elevated.  Chest x-ray did not show pneumonia.  Patient has not made any new urine into his Foley catheter, have not yet collected urinalysis.  On reassessment, patient still has stable vital signs.  Patient is not hypertensive, not tachycardic, not tachypneic.  Clinically I am concerned about infection with the lactic acid elevation versus dehydration driving everything.  MRI is likely better to evaluate for worsened infection of the spine however, I do not feel patient is stable for a prolonged MRI at this time with his hyperkalemia and worsening kidney function.  Patient will be given more fluids.  CT scan scans without contrast of the neck, chest, abdomen and pelvis will be obtained to look for intra-abdominal pathology.  Patient will be broadened with broad-spectrum antibiotics during his workup.  Patient will be admitted for further management.  CT scans revealed evidence of enlarged bladder despite Foley catheter being in the correct placement.  Foley catheter was exchanged several times and continued to have failure of urine drainage.  When bladder was pressed, urine would come around the catheter.  Urology was called and they came to the bedside.  After their evaluation and manipulation of the Foley, a piece of tissue was extracted from the Foley which was  likely causing a ball valve type blockage to the Foley.  Foley catheter drained approximately 2.5 L of urine.  Suspect this is going to be a an obstructive type injury to the kidneys.    Nephrology was called and felt that since this was a outlet obstruction problem, they did not feel he needed transfer to [facility] as the kidney issues will likely resolve with drainage and fluids.  Hospital team will  be called for admission.  Patient will be admitted to hospitalist service.    Final Clinical Impressions(s) / ED Diagnoses   Final diagnoses:  Problem with Foley catheter, initial encounter ([place])  Hydronephrosis, unspecified hydronephrosis type  AKI (acute kidney injury) ([place])  Hyperkalemia  Dehydration  Generalized abdominal pain  Non-intractable vomiting with nausea, unspecified vomiting type  Lower urinary tract infectious disease     Clinical Impression: 1. Problem with Foley catheter, initial encounter ([place])   2. Hydronephrosis, unspecified hydronephrosis type   3. AKI (acute kidney injury) ([place])   4. Hyperkalemia   5. Dehydration   6. Generalized abdominal pain   7. Non-intractable vomiting with nausea, unspecified vomiting type   8. Lower urinary tract infectious disease     Disposition: Admit  This note was prepared with assistance of Dragon voice recognition software. Occasional wrong-word or sound-a-like substitutions may have occurred due to the inherent limitations of voice recognition software.      Gennaro Lizotte, Gwenyth Allegra, MD 04/29/2017 [phone]

## 2017-05-01 NOTE — H&P (Signed)
History and Physical    Dylan [name] DXI:[id number] DOB: 1965/10/12 DOA: 04/10/2017  PCP: Patient, No Pcp Per Patient coming from: Home patient lives at home alone.  Chief Complaint: Abdominal pain  HPI: Dylan [name] is a 52 y.o. male with medical history significant of recent quadriplegia, MS, decompression of spinal abscess admitted with abdominal pain and hypoglycemia.  He was diagnosed with MS and spinal stenosis .  He was admitted to neurosurgery 04/12/2017.  He had surgery at C6 C5 and C7.  Postoperative course was complicated with atrial fibrillation placed on Eliquis followed by which epidural clot versus abscess was seen on his C-spine MRI.  Because of the high risk patient decided not to repeat exploratory surgery.  He was started on IV antibiotics due to complicated UTI possible cervical abscess and was discharged to skilled nursing facility on 04/09/2017 patient was supposed to be on vancomycin and cefepime until May 06, 2017.  But the vancomycin was stopped earlier due to increasing creatinine. Patient was found to have gradual progressive swelling of his neck a CT scan at that time showed prevertebral hematoma. When patient came in today his Foley catheter was not draining.  Urology was consulted.  After seeing the Foley a small piece of tissue was removed which was occluding the catheter.  Then he put out 2.5 L.  ED physician also spoke to nephrologist who would be standby if needed as a consult.  ED Course: Patient was seen by urology for nondraining urine Foley catheter after flushing started working and drain 2.5 L of urine.  He was found to have a BUN of 108 creatinine of 5.68 sodium 131 potassium of 6.4 he was given Kayexalate D50 .  I do not have a repeat BMP on him at this time.  Patient felt better after the Foley stopped Foley catheter started draining.  His urine color is blood-tinged.  His creatinine was 1.2 on April 19, 2017 and today is 5.68.  Patient had a CT scan of the  ER that showedPrior C6 corpectomy with anterior cervical fusion from C5 through C7. severe right and moderate left foraminal stenosis at C5-6. Moderate right foraminal stenosis at C6-7. Mild soft tissue prominence along the right anterolateral aspect of C6-7 vertebral body likely reflecting resolving hematoma, but infection cannot be completely excluded. 2. Hematoma anterior to the right sternocleidomastoid muscle in the subcutaneous fat is partially visualized and decreased in size compared with 04/12/2017. CT of the abdomen in the ER that showed bladder distention in spite of catheter in place. Review of Systems: As per HPI otherwise all other systems reviewed and are negative  Ambulatory Status:  Past Medical History:  Diagnosis Date  . 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 Diabetes mellitus without complication ([place])   . Hypertension   . MS (congenital mitral stenosis) 2019  . Persistent atrial fibrillation with rapid ventricular response ([place]) 03/27/2017    Past Surgical History:  Procedure Laterality Date  . ANTERIOR CERVICAL DECOMP/DISCECTOMY FUSION N/A 03/25/2017   Procedure: ANTERIOR CERVICAL DECOMPRESSION/DISCECTOMY FUSION CERVICAL FIVE- CERVICAL SIX, CERVICAL SIX- CERVICAL SEVEN, CORPECTOMY;  Surgeon: Consuella Lose, MD;  Location: [facility];  Service: Neurosurgery;  Laterality: N/A;  . NOSE SURGERY     AS A CHILD    Social History   Socioeconomic History  . Marital status: Single    Spouse name: Not on file  . Number of children: Not on file  . Years of education: Not on file  . Highest education level:  Not on file  Occupational History  . Not on file  Social Needs  . Financial resource strain: Not on file  . Food insecurity:    Worry: Not on file    Inability: Not on file  . Transportation needs:    Medical: Not on file    Non-medical: Not on file  Tobacco Use  . Smoking status: Never Smoker  . Smokeless tobacco: Never Used  Substance and Sexual Activity  .  Alcohol use: No    Frequency: Never  . Drug use: No  . Sexual activity: Not on file  Lifestyle  . Physical activity:    Days per week: Not on file    Minutes per session: Not on file  . Stress: Not on file  Relationships  . Social connections:    Talks on phone: Not on file    Gets together: Not on file    Attends religious service: Not on file    Active member of club or organization: Not on file    Attends meetings of clubs or organizations: Not on file    Relationship status: Not on file  . Intimate partner violence:    Fear of current or ex partner: Not on file    Emotionally abused: Not on file    Physically abused: Not on file    Forced sexual activity: Not on file  Other Topics Concern  . Not on file  Social History Narrative  . Not on file    No Known Allergies  Family History  Problem Relation Age of Onset  . Prostate cancer Father      Prior to Admission medications   Medication Sig Start Date End Date Taking? Authorizing Provider  acetaminophen (TYLENOL) 325 MG tablet Take 650 mg by mouth every 4 (four) hours as needed. 04/09/17  Yes [provider]  amiodarone (PACERONE) 200 MG tablet Take 1 tablet (200 mg total) by mouth daily. 04/10/17  Yes Cherene Altes, MD  b complex vitamins tablet Take 1 tablet by mouth daily.   Yes [provider]  bisacodyl (DULCOLAX) 5 MG EC tablet Take 1 tablet (5 mg total) by mouth daily as needed for moderate constipation. 04/09/17  Yes Cherene Altes, MD  ceFEPime (MAXIPIME) IVPB Inject 2 g into the vein every 12 (twelve) hours. Indication:  Spinal abscess Last Day of Therapy:  05/16/2017 Labs - Once weekly:  CBC/D and BMP, Labs - Every other week:  ESR and CRP 04/09/17  Yes Cherene Altes, MD  collagenase (SANTYL) ointment Apply 1 application topically daily as needed (wound care).    Yes [provider]  dexamethasone (DECADRON) 1.5 MG tablet Take 2 tablets (3 mg total) by mouth every 8 (eight)  hours. 04/09/17  Yes Cherene Altes, MD  diltiazem (CARDIZEM) 30 MG tablet Take 1 tablet (30 mg total) by mouth every 8 (eight) hours. 04/09/17  Yes Cherene Altes, MD  GLUCERNA [facility]) LIQD Glucerna 1.5 - Give 240 ml three times daily 04/09/17  Yes [provider]  insulin aspart (NOVOLOG FLEXPEN) 100 UNIT/ML FlexPen Inject as per sliding scale subcutaneously before meals: 0 - 120 = 0 Units 121 - 150 = 2 units 151 - 200 = 4 units 201 - 250 = 6 units 251 - 300 = 8 units 301 - 350 = 10 units 351 - 400 = 12 units 401 - 450 = 14 units Notify Provider is less than 60 or greater than 450   Yes  [provider]  insulin aspart (NOVOLOG FLEXPEN) 100 UNIT/ML FlexPen Inject 10 Units into the skin 3 (three) times daily with meals.  04/20/17  Yes [provider]  Insulin Glargine (LANTUS SOLOSTAR) 100 UNIT/ML Solostar Pen Inject 70 Units into the skin at bedtime.  04/22/17  Yes [provider]  LORazepam (ATIVAN) 0.5 MG tablet Take 1 tablet (0.5 mg total) by mouth every 6 (six) hours as needed for anxiety. X 2 weeks 04/28/17  Yes Gerlene Fee, NP  Melatonin 3 MG TABS Take 3 mg by mouth at bedtime.   Yes [provider]  metFORMIN (GLUMETZA) 1000 MG (MOD) 24 hr tablet Take 1,000 mg by mouth daily with breakfast.   Yes [provider]  methocarbamol (ROBAXIN) 500 MG tablet Take 1 tablet (500 mg total) by mouth every 6 (six) hours as needed for muscle spasms. 04/09/17  Yes Cherene Altes, MD  Multiple Vitamin (MULTIVITAMIN) tablet Take 1 tablet by mouth daily. 04/17/17  Yes [provider]  ondansetron (ZOFRAN) 4 MG tablet Take 1 tablet (4 mg total) by mouth every 6 (six) hours as needed for nausea. 04/09/17  Yes Cherene Altes, MD  pantoprazole (PROTONIX) 40 MG tablet Take 1 tablet (40 mg total) by mouth daily. 04/10/17  Yes Cherene Altes, MD  promethazine (PHENERGAN) 25 MG/ML injection Inject 25 mg into the vein every 8 (eight) hours  as needed for nausea or vomiting.   Yes [provider]  senna-docusate (SENOKOT-S) 8.6-50 MG tablet Take 1 tablet by mouth at bedtime as needed for mild constipation. 04/09/17  Yes Cherene Altes, MD  sertraline (ZOLOFT) 50 MG tablet Take 50 mg by mouth daily.  04/29/17  Yes [provider]  sodium chloride 0.9 % injection Inject 10 mLs into the vein 2 (two) times daily. Before and after antibiotics   Yes [provider]  traZODone (DESYREL) 50 MG tablet Take 50 mg by mouth at bedtime as needed for sleep.   Yes [provider]  zolpidem (AMBIEN) 10 MG tablet Take 10 mg by mouth at bedtime as needed for sleep. 04/28/17  Yes [provider]  vancomycin IVPB Inject 500 mg into the vein every 12 (twelve) hours. Indication:  Spinal abscess Last Day of Therapy:  05/16/2017 Labs - Sunday/Monday:  CBC/D, BMP, and vancomycin trough. Labs - Thursday:  BMP and vancomycin trough Labs - Every other week:  ESR and CRP Patient not taking: Reported on 04/30/2017 04/09/17   Cherene Altes, MD    Physical Exam: Vitals:   05/02/2017 1004 04/10/2017 1149 04/04/2017 1302 04/22/2017 1419  BP:  138/88  140/90  Pulse:  82  91  Resp:  15  16  Temp: 97.6 F (36.4 C)     TempSrc: Rectal     SpO2:  100% 100% 100%  Weight:         General:  Appears calm and comfortable Eyes: PERRL, EOMI, normal lids, iris ENT: grossly normal hearing, lips & tongue, mmm Neck: no LAD, masses or thyromegaly Cardiovascular: RRR, no m/r/g. No LE edema.  Respiratory:  CTA bilaterally, no w/r/r. Normal respiratory effort. Abdomen:  soft, distended mildly tender to deep palpation no rebound no guarding.  Foley catheter draining blood-tinged urine. Skin:  no rash or induration seen on limited exam Musculoskeletal: grossly normal tone BUE/BLE, good ROM, no bony abnormality Psychiatric:  grossly normal mood and affect, speech fluent and appropriate, AOx3 Neurologic: CN 2-12 grossly intact, moves  all extremities  in coordinated fashion, sensation intact  Labs on Admission: I have personally reviewed following labs and imaging studies  CBC: Recent Labs  Lab 04/26/17 04/28/2017 0946  WBC 12.3 23.3*  NEUTROABS 11 20.7*  HGB 13.3* 11.8*  HCT 39* 33.9*  MCV  --  86.5  PLT 122* 809*   Basic Metabolic Panel: Recent Labs  Lab 04/26/17 04/29/17 04/30/17 04/10/2017 0946  NA 137 130* 133* 131*  K 4.4 5.6* 5.9* 6.4*  CL  --   --   --  102  CO2  --   --   --  13*  GLUCOSE  --   --   --  54*  BUN 45* 67* 83* 108*  CREATININE 1.5* 3.1* 4.4* 5.68*  CALCIUM  --   --   --  8.5*   GFR: Estimated Creatinine Clearance: 21.5 mL/min (A) (by C-G formula based on SCr of 5.68 mg/dL (H)). Liver Function Tests: Recent Labs  Lab 04/11/2017 0946  AST 20  ALT 35  ALKPHOS 57  BILITOT 0.7  PROT 6.0*  ALBUMIN 2.4*   Recent Labs  Lab 04/19/2017 0946  LIPASE 25   No results for input(s): AMMONIA in the last 168 hours. Coagulation Profile: No results for input(s): INR, PROTIME in the last 168 hours. Cardiac Enzymes: Recent Labs  Lab 04/07/2017 0946  CKTOTAL 36*   BNP (last 3 results) No results for input(s): PROBNP in the last 8760 hours. HbA1C: No results for input(s): HGBA1C in the last 72 hours. CBG: Recent Labs  Lab 04/28/2017 0846 04/11/2017 1011  GLUCAP 61* 152*   Lipid Profile: No results for input(s): CHOL, HDL, LDLCALC, TRIG, CHOLHDL, LDLDIRECT in the last 72 hours. Thyroid Function Tests: No results for input(s): TSH, T4TOTAL, FREET4, T3FREE, THYROIDAB in the last 72 hours. Anemia Panel: No results for input(s): VITAMINB12, FOLATE, FERRITIN, TIBC, IRON, RETICCTPCT in the last 72 hours. Urine analysis:    Component Value Date/Time   COLORURINE YELLOW 04/24/2017 0920   APPEARANCEUR TURBID (A) 04/17/2017 0920   LABSPEC 1.015 04/14/2017 0920   PHURINE 6.0 04/14/2017 0920   GLUCOSEU NEGATIVE 04/08/2017 0920   HGBUR LARGE (A) 04/23/2017 0920   BILIRUBINUR NEGATIVE 04/20/2017  0920   KETONESUR NEGATIVE 04/17/2017 0920   PROTEINUR 30 (A) 04/15/2017 0920   NITRITE NEGATIVE 04/29/2017 0920   LEUKOCYTESUR MODERATE (A) 04/20/2017 0920    Creatinine Clearance: Estimated Creatinine Clearance: 21.5 mL/min (A) (by C-G formula based on SCr of 5.68 mg/dL (H)).  Sepsis Labs: '@LABRCNTIP'$ (procalcitonin:4,lacticidven:4) )No results found for this or any previous visit (from the past 240 hour(s)).   Radiological Exams on Admission: Ct Abdomen Pelvis Wo Contrast  Result Date: 04/12/2017 CLINICAL DATA:  Nausea and vomiting. Abdominal pain. Evaluate for spinal infection. Elevated lactic acid. EXAM: CT CHEST, ABDOMEN AND PELVIS WITHOUT CONTRAST TECHNIQUE: Multidetector CT imaging of the chest, abdomen and pelvis was performed following the standard protocol without IV contrast. COMPARISON:  Chest radiograph of earlier today.  No prior CTs. FINDINGS: CT CHEST FINDINGS Cardiovascular: Multifactorial degradation, including arm position, minimal motion, lack of IV contrast and lack of oral contrast in the abdomen or pelvis. A right-sided PICC line terminates at the superior caval/atrial junction. Tortuous thoracic aorta. Normal heart size, without pericardial effusion. Lad coronary artery atherosclerosis on image 32/2. Mediastinum/Nodes: No mediastinal or definite hilar adenopathy, given limitations of unenhanced CT. Lungs/Pleura: Trace right pleural fluid or thickening. Subsegmental atelectasis at both lung bases dependently. Musculoskeletal: No acute osseous abnormality. Lower cervical spine fixation. CT ABDOMEN  PELVIS FINDINGS Hepatobiliary: Degradation continuing into the abdomen, including EKG wire and lead artifact. Grossly normal liver and gallbladder, without biliary duct dilatation. Pancreas: Normal, without mass or ductal dilatation. Spleen: Normal in size, without focal abnormality. Adrenals/Urinary Tract: Normal adrenal glands. No renal calculi. Minimal bilateral caliectasis is  favored to be physiologic in the setting of bladder distension. There is symmetric mild bilateral perirenal edema. Bladder distension, despite Foley catheter in place. Stomach/Bowel: Normal stomach, without wall thickening. Large amount of right-sided colonic stool. Normal terminal ileum. Appendix not visualized. Grossly normal small bowel. Vascular/Lymphatic: Aortic and branch vessel atherosclerosis. No abdominopelvic adenopathy. Reproductive: Normal prostate. Other: No significant free fluid.  No free intraperitoneal air. Musculoskeletal: No acute osseous abnormality. Lumbosacral spondylosis. IMPRESSION: 1. Multifactorial moderate degradation, including lack of oral/IV contrast, motion, arm position, EKG wires and leads. 2. Given these limitations, no acute findings. 3. Bladder distension, despite catheter in place. Correlate with catheter function. Mild bilateral caliectasis is favored to be secondary. 4. Nonspecific symmetric bilateral perirenal edema. 5. Age advanced coronary artery atherosclerosis. Recommend assessment of coronary risk factors and consideration of medical therapy. 6.  Aortic Atherosclerosis (ICD10-I70.0). 7.  Possible constipation. Electronically Signed   By: Abigail Miyamoto M.D.   On: 04/24/2017 13:24   Ct Chest Wo Contrast  Result Date: 04/27/2017 CLINICAL DATA:  Nausea and vomiting. Abdominal pain. Evaluate for spinal infection. Elevated lactic acid. EXAM: CT CHEST, ABDOMEN AND PELVIS WITHOUT CONTRAST TECHNIQUE: Multidetector CT imaging of the chest, abdomen and pelvis was performed following the standard protocol without IV contrast. COMPARISON:  Chest radiograph of earlier today.  No prior CTs. FINDINGS: CT CHEST FINDINGS Cardiovascular: Multifactorial degradation, including arm position, minimal motion, lack of IV contrast and lack of oral contrast in the abdomen or pelvis. A right-sided PICC line terminates at the superior caval/atrial junction. Tortuous thoracic aorta. Normal heart  size, without pericardial effusion. Lad coronary artery atherosclerosis on image 32/2. Mediastinum/Nodes: No mediastinal or definite hilar adenopathy, given limitations of unenhanced CT. Lungs/Pleura: Trace right pleural fluid or thickening. Subsegmental atelectasis at both lung bases dependently. Musculoskeletal: No acute osseous abnormality. Lower cervical spine fixation. CT ABDOMEN PELVIS FINDINGS Hepatobiliary: Degradation continuing into the abdomen, including EKG wire and lead artifact. Grossly normal liver and gallbladder, without biliary duct dilatation. Pancreas: Normal, without mass or ductal dilatation. Spleen: Normal in size, without focal abnormality. Adrenals/Urinary Tract: Normal adrenal glands. No renal calculi. Minimal bilateral caliectasis is favored to be physiologic in the setting of bladder distension. There is symmetric mild bilateral perirenal edema. Bladder distension, despite Foley catheter in place. Stomach/Bowel: Normal stomach, without wall thickening. Large amount of right-sided colonic stool. Normal terminal ileum. Appendix not visualized. Grossly normal small bowel. Vascular/Lymphatic: Aortic and branch vessel atherosclerosis. No abdominopelvic adenopathy. Reproductive: Normal prostate. Other: No significant free fluid.  No free intraperitoneal air. Musculoskeletal: No acute osseous abnormality. Lumbosacral spondylosis. IMPRESSION: 1. Multifactorial moderate degradation, including lack of oral/IV contrast, motion, arm position, EKG wires and leads. 2. Given these limitations, no acute findings. 3. Bladder distension, despite catheter in place. Correlate with catheter function. Mild bilateral caliectasis is favored to be secondary. 4. Nonspecific symmetric bilateral perirenal edema. 5. Age advanced coronary artery atherosclerosis. Recommend assessment of coronary risk factors and consideration of medical therapy. 6.  Aortic Atherosclerosis (ICD10-I70.0). 7.  Possible constipation.  Electronically Signed   By: Abigail Miyamoto M.D.   On: 04/24/2017 13:24   Ct Cervical Spine Wo Contrast  Result Date: 04/16/2017 CLINICAL DATA:  Elevated WBC.  Evaluate for  spinal flexion. EXAM: CT CERVICAL SPINE WITHOUT CONTRAST TECHNIQUE: Multidetector CT imaging of the cervical spine was performed without intravenous contrast. Multiplanar CT image reconstructions were also generated. COMPARISON:  CT cervical spine 04/12/2017 MR cervical spine 04/01/2017 FINDINGS: Alignment: Normal. Skull base and vertebrae: No acute fracture. No primary bone lesion or focal pathologic process. Soft tissues and spinal canal: No prevertebral fluid or swelling. No visible canal hematoma. Mild soft tissue prominence along the right anterolateral aspect of C6-7 vertebral body likely reflecting resolving hematoma, but infection cannot be completely excluded. Hematoma anterior to the right sternocleidomastoid muscle in the subcutaneous fat is partially visualized and decreased in size compared with 04/12/2017. Disc levels: Prior C6 corpectomy with anterior cervical fusion from C5 through C7. severe right and moderate left foraminal stenosis at C5-6. Moderate right foraminal stenosis at C6-7. Mild bilateral foraminal stenosis at C7-T1 and T1-2. Upper chest: Lung apices are clear. IMPRESSION: 1. Prior C6 corpectomy with anterior cervical fusion from C5 through C7. severe right and moderate left foraminal stenosis at C5-6. Moderate right foraminal stenosis at C6-7. Mild soft tissue prominence along the right anterolateral aspect of C6-7 vertebral body likely reflecting resolving hematoma, but infection cannot be completely excluded. 2. Hematoma anterior to the right sternocleidomastoid muscle in the subcutaneous fat is partially visualized and decreased in size compared with 04/12/2017. Electronically Signed   By: Kathreen Devoid   On: 04/30/2017 13:23   Dg Chest Portable 1 View  Result Date: 04/10/2017 CLINICAL DATA:  Chills.  Acute  kidney injury. EXAM: PORTABLE CHEST 1 VIEW COMPARISON:  04/01/2017 FINDINGS: Right upper extremity PICC with tip at the SVC. Normal heart size and mediastinal contours. The lungs are clear and well aerated. No acute osseous finding. IMPRESSION: No evidence of active disease. Electronically Signed   By: Monte Fantasia M.D.   On: 04/21/2017 09:56    EKG: Independently reviewed.   Assessment/Plan Active Problems:   AKI (acute kidney injury) ([place])   1] acute kidney injury-patient's creatinine has increased significantly compared to beginning of this month it was 1.2.  It is probably multifactorial related secondary to nephrotoxic drugs like vancomycin, postobstructive uropathy which was called in the ER today.  ED physician spoke to nephrologist on call.  They will not be officially consulting at please call them tomorrow if the renal functions are getting worse.  I expect the renal functions get better once the postobstructive situation has been cleared.  Patient was hyperkalemic- received Kayexalate and D50 will repeat a stat BMP now.  Patient had a CT scan of neck and abdomen and chest in the ER.  2] leukocytosis with positive UA-urine culture pending discussed with Dr. Megan Salon infectious disease.  He will see the patient tomorrow.  Continue cefepime.  3] type 2 diabetes restart Lantus at a lower dose hold metformin.   DVT prophylaxis: SCD Code Status: DO NOT RESUSCITATE Family Communication: He has no family Disposition Plan: TBD Consults called: Infectious disease, urology.  ED physician spoke to nephrologist who would consult if needed.  Or if the renal function gets worse. Admission status: Inpatient   Georgette Shell MD [facility]  If 7PM-7AM, please contact night-coverage [email or web address] Password [email or web address]  05/02/2017, 5:59 PM

## 2017-05-01 NOTE — Progress Notes (Signed)
Received patient from ED. Settled pt in room and reported off to next shift. Melton Alar, RN

## 2017-05-02 DIAGNOSIS — T148XXA Other injury of unspecified body region, initial encounter: Secondary | ICD-10-CM

## 2017-05-02 DIAGNOSIS — F329 Major depressive disorder, single episode, unspecified: Secondary | ICD-10-CM

## 2017-05-02 DIAGNOSIS — I2510 Atherosclerotic heart disease of native coronary artery without angina pectoris: Secondary | ICD-10-CM

## 2017-05-02 DIAGNOSIS — R1084 Generalized abdominal pain: Secondary | ICD-10-CM

## 2017-05-02 DIAGNOSIS — E875 Hyperkalemia: Secondary | ICD-10-CM

## 2017-05-02 DIAGNOSIS — I70209 Unspecified atherosclerosis of native arteries of extremities, unspecified extremity: Secondary | ICD-10-CM | POA: Diagnosis present

## 2017-05-02 DIAGNOSIS — R110 Nausea: Secondary | ICD-10-CM

## 2017-05-02 DIAGNOSIS — T83098A Other mechanical complication of other indwelling urethral catheter, initial encounter: Secondary | ICD-10-CM

## 2017-05-02 DIAGNOSIS — T8140XA Infection following a procedure, unspecified, initial encounter: Secondary | ICD-10-CM

## 2017-05-02 DIAGNOSIS — Z95828 Presence of other vascular implants and grafts: Secondary | ICD-10-CM

## 2017-05-02 DIAGNOSIS — G8250 Quadriplegia, unspecified: Secondary | ICD-10-CM | POA: Diagnosis present

## 2017-05-02 DIAGNOSIS — R531 Weakness: Secondary | ICD-10-CM

## 2017-05-02 DIAGNOSIS — T8149XA Infection following a procedure, other surgical site, initial encounter: Secondary | ICD-10-CM

## 2017-05-02 DIAGNOSIS — Z981 Arthrodesis status: Secondary | ICD-10-CM

## 2017-05-02 LAB — GLUCOSE, CAPILLARY
GLUCOSE-CAPILLARY: 99 mg/dL (ref 65–99)
GLUCOSE-CAPILLARY: 132 mg/dL — AB (ref 65–99)
Glucose-Capillary: 70 mg/dL (ref 65–99)
Glucose-Capillary: 79 mg/dL (ref 65–99)
Glucose-Capillary: 92 mg/dL (ref 65–99)

## 2017-05-02 LAB — VANCOMYCIN, RANDOM: VANCOMYCIN RM: 57 — AB

## 2017-05-02 LAB — RENAL FUNCTION PANEL
ALBUMIN: 2.2 g/dL — AB (ref 3.5–5.0)
ANION GAP: 15 (ref 5–15)
Albumin: 2 g/dL — ABNORMAL LOW (ref 3.5–5.0)
Anion gap: 16 — ABNORMAL HIGH (ref 5–15)
BUN: 114 mg/dL — ABNORMAL HIGH (ref 6–20)
BUN: 106 mg/dL — AB (ref 6–20)
CALCIUM: 8.1 mg/dL — AB (ref 8.9–10.3)
CALCIUM: 8 mg/dL — AB (ref 8.9–10.3)
CO2: 15 mmol/L — ABNORMAL LOW (ref 22–32)
CO2: 15 mmol/L — AB (ref 22–32)
CREATININE: 6.79 mg/dL — AB (ref 0.61–1.24)
Chloride: 107 mmol/L (ref 101–111)
Chloride: 107 mmol/L (ref 101–111)
Creatinine, Ser: 6.4 mg/dL — ABNORMAL HIGH (ref 0.61–1.24)
GFR calc Af Amer: 11 mL/min — ABNORMAL LOW (ref >60)
GFR, EST AFRICAN AMERICAN: 10 mL/min — AB (ref >60)
GFR, EST NON AFRICAN AMERICAN: 9 mL/min — AB (ref >60)
GFR, EST NON AFRICAN AMERICAN: 9 mL/min — AB (ref >60)
Glucose, Bld: 83 mg/dL (ref 65–99)
Glucose, Bld: 108 mg/dL — ABNORMAL HIGH (ref 65–99)
PHOSPHORUS: 7.9 mg/dL — AB (ref 2.5–4.6)
POTASSIUM: 5.9 mmol/L — AB (ref 3.5–5.1)
Phosphorus: 7.3 mg/dL — ABNORMAL HIGH (ref 2.5–4.6)
Potassium: 5.5 mmol/L — ABNORMAL HIGH (ref 3.5–5.1)
SODIUM: 137 mmol/L (ref 135–145)
SODIUM: 138 mmol/L (ref 135–145)

## 2017-05-02 LAB — CBC
HCT: 32.4 % — ABNORMAL LOW (ref 39.0–52.0)
Hemoglobin: 11 g/dL — ABNORMAL LOW (ref 13.0–17.0)
MCH: 29.3 pg (ref 26.0–34.0)
MCHC: 34 g/dL (ref 30.0–36.0)
MCV: 86.4 fL (ref 78.0–100.0)
Platelets: 98 10*3/uL — ABNORMAL LOW (ref 150–400)
RBC: 3.75 MIL/uL — ABNORMAL LOW (ref 4.22–5.81)
RDW: 14.5 % (ref 11.5–15.5)
WBC: 22.8 10*3/uL — AB (ref 4.0–10.5)

## 2017-05-02 LAB — BASIC METABOLIC PANEL
Anion gap: 17 — ABNORMAL HIGH (ref 5–15)
BUN: 104 mg/dL — AB (ref 6–20)
CO2: 12 mmol/L — ABNORMAL LOW (ref 22–32)
Calcium: 8.3 mg/dL — ABNORMAL LOW (ref 8.9–10.3)
Chloride: 104 mmol/L (ref 101–111)
Creatinine, Ser: 6.17 mg/dL — ABNORMAL HIGH (ref 0.61–1.24)
GFR calc Af Amer: 11 mL/min — ABNORMAL LOW (ref >60)
GFR, EST NON AFRICAN AMERICAN: 10 mL/min — AB (ref >60)
GLUCOSE: 75 mg/dL (ref 65–99)
Potassium: 6.5 mmol/L (ref 3.5–5.1)
SODIUM: 133 mmol/L — AB (ref 135–145)

## 2017-05-02 LAB — URINE CULTURE: Culture: NO GROWTH

## 2017-05-02 MED ORDER — INSULIN ASPART 100 UNIT/ML ~~LOC~~ SOLN
0.0000 [IU] | Freq: Three times a day (TID) | SUBCUTANEOUS | Status: DC
  Administered 2017-05-03 – 2017-05-04 (×4): 3 [IU] via SUBCUTANEOUS
  Administered 2017-05-04 (×2): 5 [IU] via SUBCUTANEOUS
  Administered 2017-05-05 (×3): 3 [IU] via SUBCUTANEOUS
  Administered 2017-05-06 (×3): 2 [IU] via SUBCUTANEOUS
  Administered 2017-05-07 (×2): 3 [IU] via SUBCUTANEOUS
  Administered 2017-05-09 – 2017-05-10 (×5): 2 [IU] via SUBCUTANEOUS
  Administered 2017-05-11: 5 [IU] via SUBCUTANEOUS
  Administered 2017-05-11: 2 [IU] via SUBCUTANEOUS
  Administered 2017-05-11: 3 [IU] via SUBCUTANEOUS
  Administered 2017-05-12 (×2): 2 [IU] via SUBCUTANEOUS
  Administered 2017-05-12 – 2017-05-13 (×2): 3 [IU] via SUBCUTANEOUS
  Administered 2017-05-13: 2 [IU] via SUBCUTANEOUS
  Administered 2017-05-13: 3 [IU] via SUBCUTANEOUS
  Administered 2017-05-14 (×2): 2 [IU] via SUBCUTANEOUS
  Administered 2017-05-15: 1 [IU] via SUBCUTANEOUS
  Administered 2017-05-16: 2 [IU] via SUBCUTANEOUS
  Administered 2017-05-16 – 2017-05-18 (×4): 1 [IU] via SUBCUTANEOUS
  Administered 2017-05-19: 2 [IU] via SUBCUTANEOUS
  Administered 2017-05-19 – 2017-05-22 (×4): 1 [IU] via SUBCUTANEOUS
  Administered 2017-05-22 (×2): 2 [IU] via SUBCUTANEOUS
  Administered 2017-05-23: 1 [IU] via SUBCUTANEOUS
  Administered 2017-05-23 – 2017-05-24 (×2): 2 [IU] via SUBCUTANEOUS
  Administered 2017-05-24: 1 [IU] via SUBCUTANEOUS
  Administered 2017-05-26: 5 [IU] via SUBCUTANEOUS
  Administered 2017-05-26: 2 [IU] via SUBCUTANEOUS
  Administered 2017-05-27: 1 [IU] via SUBCUTANEOUS
  Administered 2017-05-27: 3 [IU] via SUBCUTANEOUS
  Administered 2017-05-27: 1 [IU] via SUBCUTANEOUS
  Administered 2017-05-28 – 2017-05-29 (×4): 2 [IU] via SUBCUTANEOUS
  Administered 2017-05-29 – 2017-06-01 (×3): 3 [IU] via SUBCUTANEOUS
  Administered 2017-06-01: 1 [IU] via SUBCUTANEOUS
  Administered 2017-06-02: 3 [IU] via SUBCUTANEOUS
  Administered 2017-06-03: 1 [IU] via SUBCUTANEOUS
  Administered 2017-06-03: 2 [IU] via SUBCUTANEOUS
  Administered 2017-06-03: 1 [IU] via SUBCUTANEOUS
  Administered 2017-06-04 – 2017-06-05 (×5): 2 [IU] via SUBCUTANEOUS
  Administered 2017-06-05 – 2017-06-06 (×2): 5 [IU] via SUBCUTANEOUS
  Administered 2017-06-06: 3 [IU] via SUBCUTANEOUS
  Administered 2017-06-06: 5 [IU] via SUBCUTANEOUS
  Administered 2017-06-07 (×2): 1 [IU] via SUBCUTANEOUS
  Administered 2017-06-08: 2 [IU] via SUBCUTANEOUS
  Administered 2017-06-08 (×2): 1 [IU] via SUBCUTANEOUS
  Administered 2017-06-09: 3 [IU] via SUBCUTANEOUS
  Administered 2017-06-09: 1 [IU] via SUBCUTANEOUS
  Administered 2017-06-09: 3 [IU] via SUBCUTANEOUS
  Administered 2017-06-10: 2 [IU] via SUBCUTANEOUS
  Administered 2017-06-10: 1 [IU] via SUBCUTANEOUS
  Administered 2017-06-10: 2 [IU] via SUBCUTANEOUS

## 2017-05-02 MED ORDER — SODIUM CHLORIDE 0.9 % IV SOLN
1000.0000 mg | INTRAVENOUS | Status: DC
  Administered 2017-05-02 – 2017-05-06 (×3): 1000 mg via INTRAVENOUS
  Filled 2017-05-02 (×3): qty 20

## 2017-05-02 MED ORDER — DEXAMETHASONE 1.5 MG PO TABS
3.0000 mg | ORAL_TABLET | Freq: Three times a day (TID) | ORAL | Status: DC
  Administered 2017-05-02 – 2017-06-06 (×98): 3 mg via ORAL
  Filled 2017-05-02 (×4): qty 2
  Filled 2017-05-02: qty 0.5
  Filled 2017-05-02: qty 2
  Filled 2017-05-02 (×2): qty 0.5
  Filled 2017-05-02 (×2): qty 2
  Filled 2017-05-02: qty 0.5
  Filled 2017-05-02 (×2): qty 2
  Filled 2017-05-02: qty 0.5
  Filled 2017-05-02: qty 2
  Filled 2017-05-02: qty 0.5
  Filled 2017-05-02 (×4): qty 2
  Filled 2017-05-02: qty 0.5
  Filled 2017-05-02: qty 2
  Filled 2017-05-02: qty 0.5
  Filled 2017-05-02 (×8): qty 2
  Filled 2017-05-02: qty 0.5
  Filled 2017-05-02 (×3): qty 2
  Filled 2017-05-02: qty 0.5
  Filled 2017-05-02: qty 2
  Filled 2017-05-02: qty 0.5
  Filled 2017-05-02 (×5): qty 2
  Filled 2017-05-02: qty 0.5
  Filled 2017-05-02 (×6): qty 2
  Filled 2017-05-02: qty 0.5
  Filled 2017-05-02 (×8): qty 2
  Filled 2017-05-02: qty 0.5
  Filled 2017-05-02: qty 2
  Filled 2017-05-02: qty 0.5
  Filled 2017-05-02 (×3): qty 2
  Filled 2017-05-02 (×2): qty 0.5
  Filled 2017-05-02 (×2): qty 2
  Filled 2017-05-02: qty 0.5
  Filled 2017-05-02 (×10): qty 2
  Filled 2017-05-02: qty 0.5
  Filled 2017-05-02: qty 2
  Filled 2017-05-02: qty 0.5
  Filled 2017-05-02 (×2): qty 2
  Filled 2017-05-02: qty 0.5
  Filled 2017-05-02 (×2): qty 2
  Filled 2017-05-02: qty 0.5
  Filled 2017-05-02 (×2): qty 2
  Filled 2017-05-02: qty 0.5
  Filled 2017-05-02 (×2): qty 2
  Filled 2017-05-02: qty 0.5
  Filled 2017-05-02 (×11): qty 2
  Filled 2017-05-02: qty 0.5
  Filled 2017-05-02 (×4): qty 2

## 2017-05-02 MED ORDER — SODIUM BICARBONATE 8.4 % IV SOLN
INTRAVENOUS | Status: DC
  Administered 2017-05-02 – 2017-05-04 (×3): via INTRAVENOUS
  Filled 2017-05-02 (×5): qty 150

## 2017-05-02 MED ORDER — INSULIN ASPART 100 UNIT/ML ~~LOC~~ SOLN
0.0000 [IU] | Freq: Every day | SUBCUTANEOUS | Status: DC
  Administered 2017-05-03 – 2017-05-04 (×2): 3 [IU] via SUBCUTANEOUS
  Administered 2017-05-05 – 2017-05-26 (×6): 2 [IU] via SUBCUTANEOUS
  Administered 2017-05-27: 3 [IU] via SUBCUTANEOUS
  Administered 2017-06-05: 5 [IU] via SUBCUTANEOUS
  Administered 2017-06-10: 3 [IU] via SUBCUTANEOUS

## 2017-05-02 MED ORDER — SODIUM CHLORIDE 0.9% FLUSH
10.0000 mL | INTRAVENOUS | Status: DC | PRN
  Administered 2017-05-21 – 2017-05-27 (×3): 10 mL
  Filled 2017-05-02 (×3): qty 40

## 2017-05-02 MED ORDER — ONDANSETRON HCL 4 MG/2ML IJ SOLN
4.0000 mg | Freq: Four times a day (QID) | INTRAMUSCULAR | Status: DC | PRN
  Administered 2017-05-02: 4 mg via INTRAVENOUS
  Filled 2017-05-02: qty 2

## 2017-05-02 MED ORDER — PROMETHAZINE HCL 25 MG/ML IJ SOLN
12.5000 mg | Freq: Once | INTRAMUSCULAR | Status: AC
  Administered 2017-05-02: 12.5 mg via INTRAVENOUS
  Filled 2017-05-02: qty 1

## 2017-05-02 NOTE — Plan of Care (Signed)
Patient stable during 7 a to 7 p shift, soap suds enema administered earlier in shift with multiple large watery stools afterwards.  Foley with adequate urine output since flushing once as per earlier note.  Multiple family members in to visit with patient. Patient lies in bed with eyes closed, will open eyes if you ask him to.  Does not speak unless he is asked a direct question but then answers appropriately.  Patient did drink some water and ate small bites of soup only.  Vomited x 1 but improved after Zofran.

## 2017-05-02 NOTE — Consult Note (Addendum)
[facility] Consult Note     Date: 05/02/2017                  Patient Name:  Dylan [name]  MRN: [id number]  DOB: 07-02-1965  Age / Sex: 52 y.o., male         PCP: Patient, No Pcp Per                 Service Requesting Consult: [facility], Dr. Wyline Copas                 Reason for Consult: AKI and hyperkalemia            Chief Complaint: abd pain and hypoglycemia  HPI: Pt is a 46M with a PMH significant for HTN, MS quadriplegia, Afib.  and DM who is now seen for AKI, obstructive uropathy, and hyperkalemia.  Pt presented yesterday to the ED with the above complaints (abd pain and hypoglycemia).  He was found to have an AKI and hyperkalemia to 6.5.  It appears that pt's baseline creatinine was 1.6 as of 3/14 but has been rising since then to 3.1 on 3/28, 4.4 on 3/29, and then when he came to ED with Cr of 5.6.  Was noted to have a clogged Foley catheter.  Urology addressed issue and 2.5L urine was evacuated.  K has been addressed with kayexelate, calcium gluconate.  Pt is acidotic.  K still 6.5 this AM.  In regards to his neurosurgical history, he was admitted 03/21/17 and was discharged 04/09/2017 from [facility] for cord compression at C5-6 and C6-7.  He underwent decompression and had a postop cervical abscess vs hematoma.  Surgical evacuation not performed and pt was discharged on vanc/ cefepime for end date 05/16/2017.  He presented again to [facility] for postop hematoma and was quickly discharged since no evidence of airway compromise.  Of note, he does not appear to have been d/c'd on on [facility] for Afib.  Pt unable to give much history.  Says he's not in pain.  Having stools.  Foley became clogged again today and had to be flushed.   Past Medical History:  Diagnosis Date  . 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 Diabetes mellitus without complication ([place])   . Hypertension   . MS (congenital mitral stenosis) 2019  . Persistent atrial fibrillation with rapid ventricular response ([place])  03/27/2017    Past Surgical History:  Procedure Laterality Date  . ANTERIOR CERVICAL DECOMP/DISCECTOMY FUSION N/A 03/25/2017   Procedure: ANTERIOR CERVICAL DECOMPRESSION/DISCECTOMY FUSION CERVICAL FIVE- CERVICAL SIX, CERVICAL SIX- CERVICAL SEVEN, CORPECTOMY;  Surgeon: Consuella Lose, MD;  Location: [place];  Service: Neurosurgery;  Laterality: N/A;  . NOSE SURGERY     AS A CHILD    Family History  Problem Relation Age of Onset  . Prostate cancer Father    Social History:  reports that he has never smoked. He has never used smokeless tobacco. He reports that he does not drink alcohol or use drugs.  Allergies: No Known Allergies  Medications Prior to Admission  Medication Sig Dispense Refill  . acetaminophen (TYLENOL) 325 MG tablet Take 650 mg by mouth every 4 (four) hours as needed.    Marland Kitchen amiodarone (PACERONE) 200 MG tablet Take 1 tablet (200 mg total) by mouth daily.    Marland Kitchen b complex vitamins tablet Take 1 tablet by mouth daily.    . bisacodyl (DULCOLAX) 5 MG EC tablet Take 1 tablet (5 mg total) by mouth daily as needed  for moderate constipation. 30 tablet 0  . ceFEPime (MAXIPIME) IVPB Inject 2 g into the vein every 12 (twelve) hours. Indication:  Spinal abscess Last Day of Therapy:  05/16/2017 Labs - Once weekly:  CBC/D and BMP, Labs - Every other week:  ESR and CRP 78 Units 0  . collagenase (SANTYL) ointment Apply 1 application topically daily as needed (wound care).     Marland Kitchen dexamethasone (DECADRON) 1.5 MG tablet Take 2 tablets (3 mg total) by mouth every 8 (eight) hours.    Marland Kitchen diltiazem (CARDIZEM) 30 MG tablet Take 1 tablet (30 mg total) by mouth every 8 (eight) hours.    Marland Kitchen GLUCERNA (GLUCERNA) LIQD Glucerna 1.5 - Give 240 ml three times daily    . insulin aspart (NOVOLOG FLEXPEN) 100 UNIT/ML FlexPen Inject as per sliding scale subcutaneously before meals: 0 - 120 = 0 Units 121 - 150 = 2 units 151 - 200 = 4 units 201 - 250 = 6 units 251 - 300 = 8 units 301 - 350 = 10 units 351 -  400 = 12 units 401 - 450 = 14 units Notify Provider is less than 60 or greater than 450    . insulin aspart (NOVOLOG FLEXPEN) 100 UNIT/ML FlexPen Inject 10 Units into the skin 3 (three) times daily with meals.     . Insulin Glargine (LANTUS SOLOSTAR) 100 UNIT/ML Solostar Pen Inject 70 Units into the skin at bedtime.     Marland Kitchen LORazepam (ATIVAN) 0.5 MG tablet Take 1 tablet (0.5 mg total) by mouth every 6 (six) hours as needed for anxiety. X 2 weeks 60 tablet 0  . Melatonin 3 MG TABS Take 3 mg by mouth at bedtime.    . metFORMIN (GLUMETZA) 1000 MG (MOD) 24 hr tablet Take 1,000 mg by mouth daily with breakfast.    . methocarbamol (ROBAXIN) 500 MG tablet Take 1 tablet (500 mg total) by mouth every 6 (six) hours as needed for muscle spasms.    . Multiple Vitamin (MULTIVITAMIN) tablet Take 1 tablet by mouth daily.    . ondansetron (ZOFRAN) 4 MG tablet Take 1 tablet (4 mg total) by mouth every 6 (six) hours as needed for nausea. 20 tablet 0  . pantoprazole (PROTONIX) 40 MG tablet Take 1 tablet (40 mg total) by mouth daily.    . promethazine (PHENERGAN) 25 MG/ML injection Inject 25 mg into the vein every 8 (eight) hours as needed for nausea or vomiting.    . senna-docusate (SENOKOT-S) 8.6-50 MG tablet Take 1 tablet by mouth at bedtime as needed for mild constipation.    . sertraline (ZOLOFT) 50 MG tablet Take 50 mg by mouth daily.     . sodium chloride 0.9 % injection Inject 10 mLs into the vein 2 (two) times daily. Before and after antibiotics    . traZODone (DESYREL) 50 MG tablet Take 50 mg by mouth at bedtime as needed for sleep.    Marland Kitchen zolpidem (AMBIEN) 10 MG tablet Take 10 mg by mouth at bedtime as needed for sleep.    . vancomycin IVPB Inject 500 mg into the vein every 12 (twelve) hours. Indication:  Spinal abscess Last Day of Therapy:  05/16/2017 Labs - Sunday/Monday:  CBC/D, BMP, and vancomycin trough. Labs - Thursday:  BMP and vancomycin trough Labs - Every other week:  ESR and CRP (Patient not  taking: Reported on 04/30/2017) 78 Units 0    Results for orders placed or performed during the hospital encounter of 04/16/2017 (from the past  48 hour(s))  CBG monitoring, ED     Status: Abnormal   Collection Time: 04/06/2017  8:46 AM  Result Value Ref Range   Glucose-Capillary 61 (L) 65 - 99 mg/dL  Urinalysis, Routine w reflex microscopic     Status: Abnormal   Collection Time: 04/21/2017  9:20 AM  Result Value Ref Range   Color, Urine YELLOW YELLOW   APPearance TURBID (A) CLEAR   Specific Gravity, Urine 1.015 1.005 - 1.030   pH 6.0 5.0 - 8.0   Glucose, UA NEGATIVE NEGATIVE mg/dL   Hgb urine dipstick LARGE (A) NEGATIVE   Bilirubin Urine NEGATIVE NEGATIVE   Ketones, ur NEGATIVE NEGATIVE mg/dL   Protein, ur 30 (A) NEGATIVE mg/dL   Nitrite NEGATIVE NEGATIVE   Leukocytes, UA MODERATE (A) NEGATIVE    Comment: Performed at [facility] [address] [place], [place] [address]  Urinalysis, Microscopic (reflex)     Status: Abnormal   Collection Time: 04/26/2017  9:20 AM  Result Value Ref Range   RBC / HPF 6-30 0 - 5 RBC/hpf   WBC, UA TOO NUMEROUS TO COUNT 0 - 5 WBC/hpf   Bacteria, UA FEW (A) NONE SEEN   Squamous Epithelial / LPF NONE SEEN NONE SEEN   Hyphae Yeast PRESENT     Comment: Performed at [facility], [place] [address] [place], [place] [address]  Blood culture (routine x 2)     Status: None (Preliminary result)   Collection Time: 04/24/2017  9:25 AM  Result Value Ref Range   Specimen Description      BLOOD PICC LINE Performed at [facility] [address] [place], [place] [address]    Special Requests      BOTTLES DRAWN AEROBIC AND ANAEROBIC Blood Culture adequate volume Performed at [facility] [address] [place], [place] [address]    Culture      NO GROWTH < 24 HOURS Performed at [facility] [address], [place], [place] [address]    Report Status PENDING   CBC with Differential      Status: Abnormal   Collection Time: 05/02/2017  9:46 AM  Result Value Ref Range   WBC 23.3 (H) 4.0 - 10.5 K/uL   RBC 3.92 (L) 4.22 - 5.81 MIL/uL   Hemoglobin 11.8 (L) 13.0 - 17.0 g/dL   HCT 33.9 (L) 39.0 - 52.0 %   MCV 86.5 78.0 - 100.0 fL   MCH 30.1 26.0 - 34.0 pg   MCHC 34.8 30.0 - 36.0 g/dL   RDW 14.2 11.5 - 15.5 %   Platelets 119 (L) 150 - 400 K/uL    Comment: RESULT REPEATED AND VERIFIED SPECIMEN CHECKED FOR CLOTS PLATELET COUNT CONFIRMED BY SMEAR    Neutrophils Relative % 89 %   Lymphocytes Relative 8 %   Monocytes Relative 3 %   Eosinophils Relative 0 %   Basophils Relative 0 %   Neutro Abs 20.7 (H) 1.7 - 7.7 K/uL   Lymphs Abs 1.9 0.7 - 4.0 K/uL   Monocytes Absolute 0.7 0.1 - 1.0 K/uL   Eosinophils Absolute 0.0 0.0 - 0.7 K/uL   Basophils Absolute 0.0 0.0 - 0.1 K/uL   Smear Review MORPHOLOGY UNREMARKABLE     Comment: Performed at [facility], [place] [address] [place],  [address]  Comprehensive metabolic panel     Status: Abnormal   Collection Time: 04/11/2017  9:46 AM  Result Value Ref Range  Sodium 131 (L) 135 - 145 mmol/L   Potassium 6.4 (HH) 3.5 - 5.1 mmol/L    Comment: CRITICAL RESULT CALLED TO, READ BACK BY AND VERIFIED WITH: P DOWD,RN 04/02/2017 1201 RHOLMES    Chloride 102 101 - 111 mmol/L   CO2 13 (L) 22 - 32 mmol/L   Glucose, Bld 54 (L) 65 - 99 mg/dL   BUN 108 (H) 6 - 20 mg/dL    Comment: RESULTS CONFIRMED BY MANUAL DILUTION   Creatinine, Ser 5.68 (H) 0.61 - 1.24 mg/dL   Calcium 8.5 (L) 8.9 - 10.3 mg/dL   Total Protein 6.0 (L) 6.5 - 8.1 g/dL   Albumin 2.4 (L) 3.5 - 5.0 g/dL   AST 20 15 - 41 U/L   ALT 35 17 - 63 U/L   Alkaline Phosphatase 57 38 - 126 U/L   Total Bilirubin 0.7 0.3 - 1.2 mg/dL   GFR calc non Af Amer 11 (L) >60 mL/min   GFR calc Af Amer 12 (L) >60 mL/min    Comment: (NOTE) The eGFR has been calculated using the CKD EPI equation. This calculation has not been validated in all clinical situations. eGFR's  persistently <60 mL/min signify possible Chronic Kidney Disease.    Anion gap 16 (H) 5 - 15    Comment: Performed at [facility], [place] [address], [place], [place] [address]  Lipase, blood     Status: None   Collection Time: 04/15/2017  9:46 AM  Result Value Ref Range   Lipase 25 11 - 51 U/L    Comment: Performed at [facility], [place] [address] [place], [place] [address]  CK     Status: Abnormal   Collection Time: 04/24/2017  9:46 AM  Result Value Ref Range   Total CK 36 (L) 49 - 397 U/L    Comment: Performed at [facility], [place] [address] [place], [place] [address]  I-Stat CG4 Lactic Acid, ED     Status: Abnormal   Collection Time: 04/29/2017  9:53 AM  Result Value Ref Range   Lactic Acid, Venous 2.58 (HH) 0.5 - 1.9 mmol/L   Comment NOTIFIED PHYSICIAN   CBG monitoring, ED     Status: Abnormal   Collection Time: 04/26/2017 10:11 AM  Result Value Ref Range   Glucose-Capillary 152 (H) 65 - 99 mg/dL  I-Stat CG4 Lactic Acid, ED     Status: Abnormal   Collection Time: 04/10/2017 11:52 AM  Result Value Ref Range   Lactic Acid, Venous 2.58 (HH) 0.5 - 1.9 mmol/L   Comment NOTIFIED PHYSICIAN   Basic metabolic panel     Status: Abnormal   Collection Time: 05/02/2017  7:26 PM  Result Value Ref Range   Sodium 132 (L) 135 - 145 mmol/L   Potassium 6.6 (HH) 3.5 - 5.1 mmol/L    Comment: CRITICAL RESULT CALLED TO, READ BACK BY AND VERIFIED WITH: SIMON,K AT 2014 ON 05/02/2017 BY MOSLEY,J    Chloride 104 101 - 111 mmol/L   CO2 8 (L) 22 - 32 mmol/L   Glucose, Bld 108 (H) 65 - 99 mg/dL   BUN 107 (H) 6 - 20 mg/dL    Comment: RESULTS CONFIRMED BY MANUAL DILUTION   Creatinine, Ser 5.99 (H) 0.61 - 1.24 mg/dL   Calcium 8.4 (L) 8.9 - 10.3 mg/dL   GFR calc non Af Amer 10 (L) >60 mL/min   GFR calc Af Amer 12 (L) >60 mL/min    Comment: (NOTE) The eGFR  has been calculated using the CKD EPI equation. This calculation has not been validated in all  clinical situations. eGFR's persistently <60 mL/min signify possible Chronic Kidney Disease.    Anion gap 20 (H) 5 - 15    Comment: Performed at [facility], [place] [address] [place], [place] [address]  Lactic acid, plasma     Status: Abnormal   Collection Time: 04/27/2017  7:26 PM  Result Value Ref Range   Lactic Acid, Venous 7.8 (HH) 0.5 - 1.9 mmol/L    Comment: CRITICAL RESULT CALLED TO, READ BACK BY AND VERIFIED WITH: SIMON,K AT 2014 ON 04/28/2017 BY MOSLEY,J Performed at [facility], [place] [address] [place], [place] [address]   Lactic acid, plasma     Status: Abnormal   Collection Time: 04/15/2017  9:45 PM  Result Value Ref Range   Lactic Acid, Venous 6.5 (HH) 0.5 - 1.9 mmol/L    Comment: CRITICAL RESULT CALLED TO, READ BACK BY AND VERIFIED WITH: SIMON,K AT 2248 ON 04/05/2017 BY MOSLEY,J Performed at [facility], [place] [address], [place], [place] [address]   Glucose, capillary     Status: None   Collection Time: 05/02/17 12:44 AM  Result Value Ref Range   Glucose-Capillary 92 65 - 99 mg/dL   Comment 1 Notify RN   Basic metabolic panel     Status: Abnormal   Collection Time: 05/02/17  4:11 AM  Result Value Ref Range   Sodium 133 (L) 135 - 145 mmol/L   Potassium 6.5 (HH) 3.5 - 5.1 mmol/L    Comment: CRITICAL RESULT CALLED TO, READ BACK BY AND VERIFIED WITH: SIMON,K AT 2130 ON 05/02/2017 BY MOSLEY,J    Chloride 104 101 - 111 mmol/L   CO2 12 (L) 22 - 32 mmol/L   Glucose, Bld 75 65 - 99 mg/dL   BUN 104 (H) 6 - 20 mg/dL    Comment: RESULTS CONFIRMED BY MANUAL DILUTION   Creatinine, Ser 6.17 (H) 0.61 - 1.24 mg/dL   Calcium 8.3 (L) 8.9 - 10.3 mg/dL   GFR calc non Af Amer 10 (L) >60 mL/min   GFR calc Af Amer 11 (L) >60 mL/min    Comment: (NOTE) The eGFR has been calculated using the CKD EPI equation. This calculation has not been validated in all clinical situations. eGFR's persistently <60 mL/min signify possible Chronic  Kidney Disease.    Anion gap 17 (H) 5 - 15    Comment: Performed at [facility], [place] [address], [place], [place] [address]  CBC     Status: Abnormal   Collection Time: 05/02/17  4:11 AM  Result Value Ref Range   WBC 22.8 (H) 4.0 - 10.5 K/uL   RBC 3.75 (L) 4.22 - 5.81 MIL/uL   Hemoglobin 11.0 (L) 13.0 - 17.0 g/dL   HCT 32.4 (L) 39.0 - 52.0 %   MCV 86.4 78.0 - 100.0 fL   MCH 29.3 26.0 - 34.0 pg   MCHC 34.0 30.0 - 36.0 g/dL   RDW 14.5 11.5 - 15.5 %   Platelets 98 (L) 150 - 400 K/uL    Comment: CONSISTENT WITH PREVIOUS RESULT Performed at [facility] [address], [place], [place] [address]   Glucose, capillary     Status: None   Collection Time: 05/02/17  4:33 AM  Result Value Ref Range   Glucose-Capillary 79 65 - 99 mg/dL   Comment 1 Notify RN   Glucose, capillary  Status: None   Collection Time: 05/02/17 11:44 AM  Result Value Ref Range   Glucose-Capillary 70 65 - 99 mg/dL   Ct Abdomen Pelvis Wo Contrast  Result Date: 04/15/2017 CLINICAL DATA:  Nausea and vomiting. Abdominal pain. Evaluate for spinal infection. Elevated lactic acid. EXAM: CT CHEST, ABDOMEN AND PELVIS WITHOUT CONTRAST TECHNIQUE: Multidetector CT imaging of the chest, abdomen and pelvis was performed following the standard protocol without IV contrast. COMPARISON:  Chest radiograph of earlier today.  No prior CTs. FINDINGS: CT CHEST FINDINGS Cardiovascular: Multifactorial degradation, including arm position, minimal motion, lack of IV contrast and lack of oral contrast in the abdomen or pelvis. A right-sided PICC line terminates at the superior caval/atrial junction. Tortuous thoracic aorta. Normal heart size, without pericardial effusion. Lad coronary artery atherosclerosis on image 32/2. Mediastinum/Nodes: No mediastinal or definite hilar adenopathy, given limitations of unenhanced CT. Lungs/Pleura: Trace right pleural fluid or thickening. Subsegmental atelectasis at both  lung bases dependently. Musculoskeletal: No acute osseous abnormality. Lower cervical spine fixation. CT ABDOMEN PELVIS FINDINGS Hepatobiliary: Degradation continuing into the abdomen, including EKG wire and lead artifact. Grossly normal liver and gallbladder, without biliary duct dilatation. Pancreas: Normal, without mass or ductal dilatation. Spleen: Normal in size, without focal abnormality. Adrenals/Urinary Tract: Normal adrenal glands. No renal calculi. Minimal bilateral caliectasis is favored to be physiologic in the setting of bladder distension. There is symmetric mild bilateral perirenal edema. Bladder distension, despite Foley catheter in place. Stomach/Bowel: Normal stomach, without wall thickening. Large amount of right-sided colonic stool. Normal terminal ileum. Appendix not visualized. Grossly normal small bowel. Vascular/Lymphatic: Aortic and branch vessel atherosclerosis. No abdominopelvic adenopathy. Reproductive: Normal prostate. Other: No significant free fluid.  No free intraperitoneal air. Musculoskeletal: No acute osseous abnormality. Lumbosacral spondylosis. IMPRESSION: 1. Multifactorial moderate degradation, including lack of oral/IV contrast, motion, arm position, EKG wires and leads. 2. Given these limitations, no acute findings. 3. Bladder distension, despite catheter in place. Correlate with catheter function. Mild bilateral caliectasis is favored to be secondary. 4. Nonspecific symmetric bilateral perirenal edema. 5. Age advanced coronary artery atherosclerosis. Recommend assessment of coronary risk factors and consideration of medical therapy. 6.  Aortic Atherosclerosis (ICD10-I70.0). 7.  Possible constipation. Electronically Signed   By: Abigail Miyamoto M.D.   On: 04/25/2017 13:24   Ct Chest Wo Contrast  Result Date: 04/14/2017 CLINICAL DATA:  Nausea and vomiting. Abdominal pain. Evaluate for spinal infection. Elevated lactic acid. EXAM: CT CHEST, ABDOMEN AND PELVIS WITHOUT CONTRAST  TECHNIQUE: Multidetector CT imaging of the chest, abdomen and pelvis was performed following the standard protocol without IV contrast. COMPARISON:  Chest radiograph of earlier today.  No prior CTs. FINDINGS: CT CHEST FINDINGS Cardiovascular: Multifactorial degradation, including arm position, minimal motion, lack of IV contrast and lack of oral contrast in the abdomen or pelvis. A right-sided PICC line terminates at the superior caval/atrial junction. Tortuous thoracic aorta. Normal heart size, without pericardial effusion. Lad coronary artery atherosclerosis on image 32/2. Mediastinum/Nodes: No mediastinal or definite hilar adenopathy, given limitations of unenhanced CT. Lungs/Pleura: Trace right pleural fluid or thickening. Subsegmental atelectasis at both lung bases dependently. Musculoskeletal: No acute osseous abnormality. Lower cervical spine fixation. CT ABDOMEN PELVIS FINDINGS Hepatobiliary: Degradation continuing into the abdomen, including EKG wire and lead artifact. Grossly normal liver and gallbladder, without biliary duct dilatation. Pancreas: Normal, without mass or ductal dilatation. Spleen: Normal in size, without focal abnormality. Adrenals/Urinary Tract: Normal adrenal glands. No renal calculi. Minimal bilateral caliectasis is favored to be physiologic in the setting of bladder  distension. There is symmetric mild bilateral perirenal edema. Bladder distension, despite Foley catheter in place. Stomach/Bowel: Normal stomach, without wall thickening. Large amount of right-sided colonic stool. Normal terminal ileum. Appendix not visualized. Grossly normal small bowel. Vascular/Lymphatic: Aortic and branch vessel atherosclerosis. No abdominopelvic adenopathy. Reproductive: Normal prostate. Other: No significant free fluid.  No free intraperitoneal air. Musculoskeletal: No acute osseous abnormality. Lumbosacral spondylosis. IMPRESSION: 1. Multifactorial moderate degradation, including lack of oral/IV  contrast, motion, arm position, EKG wires and leads. 2. Given these limitations, no acute findings. 3. Bladder distension, despite catheter in place. Correlate with catheter function. Mild bilateral caliectasis is favored to be secondary. 4. Nonspecific symmetric bilateral perirenal edema. 5. Age advanced coronary artery atherosclerosis. Recommend assessment of coronary risk factors and consideration of medical therapy. 6.  Aortic Atherosclerosis (ICD10-I70.0). 7.  Possible constipation. Electronically Signed   By: Abigail Miyamoto M.D.   On: 04/29/2017 13:24   Ct Cervical Spine Wo Contrast  Result Date: 04/29/2017 CLINICAL DATA:  Elevated WBC.  Evaluate for spinal flexion. EXAM: CT CERVICAL SPINE WITHOUT CONTRAST TECHNIQUE: Multidetector CT imaging of the cervical spine was performed without intravenous contrast. Multiplanar CT image reconstructions were also generated. COMPARISON:  CT cervical spine 04/12/2017 MR cervical spine 04/01/2017 FINDINGS: Alignment: Normal. Skull base and vertebrae: No acute fracture. No primary bone lesion or focal pathologic process. Soft tissues and spinal canal: No prevertebral fluid or swelling. No visible canal hematoma. Mild soft tissue prominence along the right anterolateral aspect of C6-7 vertebral body likely reflecting resolving hematoma, but infection cannot be completely excluded. Hematoma anterior to the right sternocleidomastoid muscle in the subcutaneous fat is partially visualized and decreased in size compared with 04/12/2017. Disc levels: Prior C6 corpectomy with anterior cervical fusion from C5 through C7. severe right and moderate left foraminal stenosis at C5-6. Moderate right foraminal stenosis at C6-7. Mild bilateral foraminal stenosis at C7-T1 and T1-2. Upper chest: Lung apices are clear. IMPRESSION: 1. Prior C6 corpectomy with anterior cervical fusion from C5 through C7. severe right and moderate left foraminal stenosis at C5-6. Moderate right foraminal  stenosis at C6-7. Mild soft tissue prominence along the right anterolateral aspect of C6-7 vertebral body likely reflecting resolving hematoma, but infection cannot be completely excluded. 2. Hematoma anterior to the right sternocleidomastoid muscle in the subcutaneous fat is partially visualized and decreased in size compared with 04/12/2017. Electronically Signed   By: Kathreen Devoid   On: 04/10/2017 13:23   Dg Chest Portable 1 View  Result Date: 04/17/2017 CLINICAL DATA:  Chills.  Acute kidney injury. EXAM: PORTABLE CHEST 1 VIEW COMPARISON:  04/01/2017 FINDINGS: Right upper extremity PICC with tip at the SVC. Normal heart size and mediastinal contours. The lungs are clear and well aerated. No acute osseous finding. IMPRESSION: No evidence of active disease. Electronically Signed   By: Monte Fantasia M.D.   On: 04/30/2017 09:56    ROS: al other systems reviewed and are negative except as per HPI  Blood pressure (!) 158/84, pulse (!) 111, temperature 99.3 F (37.4 C), temperature source Axillary, resp. rate 18, weight 120.4 kg (265 lb 6.4 oz), SpO2 100 %. Physical Exam  GEN frail, lying in bed HEENT EOMI NECK no JVD PULM clear anteriorly CV irregular ABD soft, nontender EXT no LE edema NEURO able to answer simple questions MSK: sarcopenic  Assessment/Plan  1.  AKI on CKD; obstructive uropathy +/- vanc toxicity?  No level yet.  Will order.  Urology following.  ? If he needs CBI for repeatedly  clogged Foley.  Will defer to them.  He's a very poor candidate for dialysis so hopefully we can avoid.  I've ordered serial labs.  2.  Hyperkalemia: s/p treatment with kayexelate but still high as of this AM.  Just starting to stool. Will add bicarb gtt to help.  Already received Ca gluc.  Last EKG with narrow QRS, nonpeaked T  3.  Cervical abscess vs hematoma: ID following.    4.  MS: with quadriplegia.  5.  Lactic acidosis: ? Possibly related to sepsis?   Madelon Lips MD [facility] pgr [phone] 05/02/2017, 2:07 PM

## 2017-05-02 NOTE — Progress Notes (Signed)
CRITICAL VALUE ALERT  Critical Value:  Vancomycin 57  Date & Time Notified:  3/31 0745  Provider Notified: Bodenheimer  Orders Received/Actions taken:

## 2017-05-02 NOTE — Progress Notes (Signed)
RN noted that patient had no urine in foley catheter tubing or bag.  RN flushed catheter with 20 mls of NS, immediately received back 600 mls blood tinged cloudy urine.    Soap suds enema administered with moderate amount of soft stool returned.

## 2017-05-02 NOTE — Consult Note (Signed)
[name] for Infectious Disease    Date of Admission:  04/23/2017           Day 31 vancomycin        Day 31 cefepime       Reason for Consult: Acute kidney injury in the setting of empiric therapy for postoperative C-spine infection    Referring Provider: Dr. Jacki Cones  Assessment: He has acute kidney injury.  It is possible that all of this is due to recent occlusion of his Foley catheter but he may also be suffering from vancomycin induced nephrotoxicity.  I will change vancomycin to daptomycin and continue cefepime.  Plan: 1. Start daptomycin 2. Continue cefepime 3. Please call us if we can be of further assistance while he is here  Diagnosis: Postoperative cervical spine infection  Culture Result: None available  No Known Allergies  OPAT Orders Discharge antibiotics: Per pharmacy protocol daptomycin and cefepime  Duration: 2 more weeks End Date: 05/16/2017  [facility] Care Per Protocol:  Labs weekly while on IV antibiotics: _x_ CBC with differential _x_ BMP __ CMP _x_ CRP _x_ ESR _x_ CK  _x_ Please pull PIC at completion of IV antibiotics __ Please leave PIC in place until doctor has seen patient or been notified  Fax weekly labs to [phone]  Clinic Follow Up Appt: 05/10/2017 at 2:30 PM at [facility] for Infectious Diseases with Dr. Lita Mains  Principal Problem:   AKI (acute kidney injury) [facility]) Active Problems:   Hematoma   History of fusion of cervical spine   Postoperative infection   Multiple sclerosis exacerbation ([place])   Quadriplegia ([place])   Uncontrolled diabetes mellitus type 2 without complications ([place])   Hypertension   Morbid obesity ([place])   Paroxysmal atrial fibrillation ([place])   Pressure injury of skin   Hyperkalemia   Insomnia   Adjustment reaction with anxiety and depression   Coronary artery disease   Atherosclerotic peripheral vascular disease ([place])   Scheduled Meds: . amiodarone  200 mg Oral  Daily  . dexamethasone  3 mg Oral Q8H  . diltiazem  30 mg Oral Q8H  . insulin aspart  10 Units Subcutaneous TID WC  . pantoprazole  40 mg Oral Daily   Continuous Infusions: . ceFEPime (MAXIPIME) IV Stopped (04/19/2017 2312)  .  sodium bicarbonate  infusion 1000 mL     PRN Meds:.acetaminophen, bisacodyl, LORazepam, methocarbamol, ondansetron (ZOFRAN) IV, ondansetron, traZODone  HPI: Dylan [name] is a 52 y.o. male who was admitted to the hospital in February with progressive weakness.  He was discovered to have multiple sclerosis and cervical spinal stenosis.  He underwent C5-7 cord decompression and fusion on 03/25/2017.  About a week postoperatively he developed fever and was found to have a large fluid collection on MRI from C3-7.  This fluid collection could not be safely aspirated.  It was unclear if it was postoperative hematoma caused by anticoagulation started for postoperative atrial fibrillation or postoperative infection.  Blood cultures were negative.  Urine cultures grew E. coli and Enterobacter.  He was seen by my partner, Dr. Lita Mains, who recommended 6 weeks of empiric vancomycin and cefepime.  He was discharged to a skilled nursing facility.  2 days ago he had decreased urine output and his creatinine jumped up to 3.08.  His vancomycin level was supratherapeutic at 47.3.  He was admitted yesterday.  His Foley catheter was found to be occluded.  His vancomycin was held.  His creatinine is up to 6.17 today.   Review of Systems: Review of Systems  Constitutional: Negative for chills, diaphoresis and fever.  Respiratory: Negative for cough, sputum production and shortness of breath.   Cardiovascular: Negative for chest pain.  Gastrointestinal: Positive for nausea. Negative for abdominal pain, diarrhea and vomiting.  Musculoskeletal: Negative for neck pain.  Skin: Negative for rash.  Neurological: Positive for focal weakness. Negative for headaches.  Psychiatric/Behavioral:  Positive for depression.    Past Medical History:  Diagnosis Date  . 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 Diabetes mellitus without complication ([place])   . Hypertension   . MS (congenital mitral stenosis) 2019  . Persistent atrial fibrillation with rapid ventricular response ([place]) 03/27/2017    Social History   Tobacco Use  . Smoking status: Never Smoker  . Smokeless tobacco: Never Used  Substance Use Topics  . Alcohol use: No    Frequency: Never  . Drug use: No    Family History  Problem Relation Age of Onset  . Prostate cancer Father    No Known Allergies  OBJECTIVE: Blood pressure (!) 176/88, pulse 99, temperature 98.6 F (37 C), temperature source Oral, resp. rate 18, weight 265 lb 6.4 oz (120.4 kg), SpO2 97 %.  Physical Exam  Constitutional: He is oriented to person, place, and time.  He is resting quietly in bed.  He is uncomfortable because of nausea.  HENT:  His surgical incision is nicely healed.  Cardiovascular: Normal rate and regular rhythm.  No murmur heard. Pulmonary/Chest: Effort normal. He has no wheezes. He has no rales.  Abdominal: Soft.  Neurological: He is alert and oriented to person, place, and time.  Skin: No rash noted.  Right arm PICC site looks good.    Lab Results Lab Results  Component Value Date   WBC 22.8 (H) 05/02/2017   HGB 11.0 (L) 05/02/2017   HCT 32.4 (L) 05/02/2017   MCV 86.4 05/02/2017   PLT 98 (L) 05/02/2017    Lab Results  Component Value Date   CREATININE 6.17 (H) 05/02/2017   BUN 104 (H) 05/02/2017   NA 133 (L) 05/02/2017   K 6.5 (HH) 05/02/2017   CL 104 05/02/2017   CO2 12 (L) 05/02/2017    Lab Results  Component Value Date   ALT 35 04/24/2017   AST 20 05/02/2017   ALKPHOS 57 04/19/2017   BILITOT 0.7 04/10/2017     Microbiology: No results found for this or any previous visit (from the past 240 hour(s)).  Michel Bickers, MD [facility] for Infectious [place] [facility] [phone]  pager   [phone] cell 05/02/2017, 11:10 AM

## 2017-05-02 NOTE — Progress Notes (Signed)
PROGRESS NOTE    Dylan [name]  GQQ:[id number] DOB: 05-28-1965 DOA: 04/26/2017 PCP: Patient, No Pcp Per    Brief Narrative:  52 y.o. male with medical history significant of recent quadriplegia, MS, decompression of spinal abscess admitted with abdominal pain and hypoglycemia.  He was diagnosed with MS and spinal stenosis .  He was admitted to neurosurgery 04/12/2017.  He had surgery at C6 C5 and C7.  Postoperative course was complicated with atrial fibrillation placed on Eliquis followed by which epidural clot versus abscess was seen on his C-spine MRI.  Because of the high risk patient decided not to repeat exploratory surgery.  He was started on IV antibiotics due to complicated UTI possible cervical abscess and was discharged to skilled nursing facility on 04/09/2017 patient was supposed to be on vancomycin and cefepime until May 06, 2017.  But the vancomycin was stopped earlier due to increasing creatinine. Patient was found to have gradual progressive swelling of his neck a CT scan at that time showed prevertebral hematoma.  Patient presented to ED with abd pain, found to have Cr of 5.68 and concerns for non-draining foley cath. After removing foreign body, cath yielded 2.5L urine. Patient also noted to have K in excess of 6.4, given kayexelate. Patient admitted for further work up.  Assessment & Plan:   Principal Problem:   AKI (acute kidney injury) ([place]) Active Problems:   Uncontrolled diabetes mellitus type 2 without complications ([place])   Hypertension   Morbid obesity ([place])   Paroxysmal atrial fibrillation ([facility])   Multiple sclerosis exacerbation ([facility])   Pressure injury of skin   Hyperkalemia   Hematoma   Insomnia   Adjustment reaction with anxiety and depression   Quadriplegia ([place])   History of fusion of cervical spine   Postoperative infection   Coronary artery disease   Atherosclerotic peripheral vascular disease ([place])  1] acute kidney injury -Markedly worsened renal  function noted -Concerns for post-obstructive disease vs nephrotoxicity given recent vanc for possible epidural abscess. Vancomycin discontinued -Patient seen by urology with foreign body removed with resultant 2L urine output -Overnight, renal function worsened. Have formally consulted Nephrology, appreciate input -Patient was started on bicarb gtt  2] leukocytosis with positive UA -urine culture ordered -Admitting physician discussed with Dr. Megan Salon infectious disease who has seen pt in consultation -Continued cefepime  3] type 2 diabetes  -metformin on hold given renal function -Hold scheduled insulin as glucose remaining in the low 70's -Will continue SSI coverage, sensitive scale given renal function  4] Possible epidural abscess -Pt known to neurosurgical service -concerns for possible epidural abscess vs hematoma, noted to be a suboptimal surgical candidate with plans to empirically treat -Pt had been on cefepime and vanc with abx planned through 4/4 -Vanc discontinued given concerns of nephrotoxicity. Patient now on daptomycin per ID -Blood culture neg thus far  5] Constipation -CT reviewed. Stool and increased bowel gas noted -Multiple doses of kayexelate were given with no effect this AM -Patient has hx of chronic constipation at baseline -Given soap suds enema with results noted  DVT prophylaxis: SCD's Code Status: DNR Family Communication: Pt in room, family not at bedside Disposition Plan: Uncertain at this time  Consultants:   Urology  Nephrology  ID  Procedures:     Antimicrobials: Anti-infectives (From admission, onward)   Start     Dose/Rate Route Frequency Ordered Stop   05/02/17 1200  DAPTOmycin (CUBICIN) 1,000 mg in sodium chloride 0.9 % IVPB     1,000  mg 240 mL/hr over 30 Minutes Intravenous Every 48 hours 05/02/17 1143     04/17/2017 1900  ceFEPIme (MAXIPIME) 1 g in sodium chloride 0.9 % 100 mL IVPB     1 g 200 mL/hr over 30 Minutes  Intravenous Daily after supper 04/11/2017 1833     04/18/2017 1800  ceFEPime (MAXIPIME) IVPB  Status:  Discontinued    Note to Pharmacy:  Indication:  Spinal abscess Last Day of Therapy:  05/16/2017 Labs - Once weekly:  CBC/D and BMP, Labs - Every other week:  ESR and CRP     2 g Intravenous Every 12 hours 04/16/2017 1757 04/21/2017 1833   04/16/2017 1245  vancomycin (VANCOCIN) 2,000 mg in sodium chloride 0.9 % 500 mL IVPB     2,000 mg 250 mL/hr over 120 Minutes Intravenous  Once 04/08/2017 1231 04/18/2017 1757   04/07/2017 1230  piperacillin-tazobactam (ZOSYN) IVPB 3.375 g     3.375 g 100 mL/hr over 30 Minutes Intravenous  Once 04/08/2017 1217 04/10/2017 1407   05/02/2017 1230  vancomycin (VANCOCIN) IVPB 1000 mg/200 mL premix  Status:  Discontinued     1,000 mg 200 mL/hr over 60 Minutes Intravenous  Once 04/08/2017 1217 04/06/2017 1231       Subjective: Complaining of abd pain and constipation  Objective: Vitals:   04/06/2017 2120 05/02/17 0427 05/02/17 1143 05/02/17 1443  BP: (!) 152/76 (!) 176/88 (!) 158/84 (!) 149/89  Pulse: 95 99 (!) 111 (!) 106  Resp: _0 Temp: (!) 97.5 F (36.4 C) 98.6 F (37 C) 99.3 F (37.4 C) 99.2 F (37.3 C)  TempSrc: Oral Oral Axillary Oral  SpO2: 100% 97% 100% 100%  Weight:        Intake/Output Summary (Last 24 hours) at 05/02/2017 1548 Last data filed at 05/02/2017 1150 Gross per 24 hour  Intake 5000 ml  Output 2225 ml  Net 2775 ml   Filed Weights   04/25/2017 0855 04/10/2017 1909  Weight: 111.6 kg (246 lb) 120.4 kg (265 lb 6.4 oz)    Examination:  General exam: Appears to be in mild discomfort Respiratory system: Clear to auscultation. Respiratory effort normal. Cardiovascular system: S1 & S2 heard, regular Gastrointestinal system:Decreased BS, generally tender Central nervous system: Alert and oriented. No focal neurological deficits. Extremities: Symmetric 5 x 5 power. Skin: No rashes, lesions  Psychiatry: Judgement and insight appear normal. Mood &  affect appropriate.   Data Reviewed: I have personally reviewed following labs and imaging studies  CBC: Recent Labs  Lab 04/26/17 04/22/2017 0946 05/02/17 0411  WBC 12.3 23.3* 22.8*  NEUTROABS 11 20.7*  --   HGB 13.3* 11.8* 11.0*  HCT 39* 33.9* 32.4*  MCV  --  86.5 86.4  PLT 122* 119* 98*   Basic Metabolic Panel: Recent Labs  Lab 04/30/17 04/06/2017 0946 04/27/2017 1926 05/02/17 0411 05/02/17 1231  NA 133* 131* 132* 133* 137  K 5.9* 6.4* 6.6* 6.5* 5.9*  CL  --  102 104 104 107  CO2  --  13* 8* 12* 15*  GLUCOSE  --  54* 108* 75 83  BUN 83* 108* 107* 104* PENDING  CREATININE 4.4* 5.68* 5.99* 6.17* 6.40*  CALCIUM  --  8.5* 8.4* 8.3* 8.1*  PHOS  --   --   --   --  7.9*   GFR: Estimated Creatinine Clearance: 19.8 mL/min (A) (by C-G formula based on SCr of 6.4 mg/dL (H)). Liver Function Tests: Recent Labs  Lab 05/02/2017 0946 05/02/17 1231  AST 20  --   ALT 35  --   ALKPHOS 57  --   BILITOT 0.7  --   PROT 6.0*  --   ALBUMIN 2.4* 2.2*   Recent Labs  Lab 04/26/2017 0946  LIPASE 25   No results for input(s): AMMONIA in the last 168 hours. Coagulation Profile: No results for input(s): INR, PROTIME in the last 168 hours. Cardiac Enzymes: Recent Labs  Lab 04/25/2017 0946  CKTOTAL 36*   BNP (last 3 results) No results for input(s): PROBNP in the last 8760 hours. HbA1C: No results for input(s): HGBA1C in the last 72 hours. CBG: Recent Labs  Lab 04/19/2017 0846 04/28/2017 1011 05/02/17 0044 05/02/17 0433 05/02/17 1144  GLUCAP 61* 152* 92 79 70   Lipid Profile: No results for input(s): CHOL, HDL, LDLCALC, TRIG, CHOLHDL, LDLDIRECT in the last 72 hours. Thyroid Function Tests: No results for input(s): TSH, T4TOTAL, FREET4, T3FREE, THYROIDAB in the last 72 hours. Anemia Panel: No results for input(s): VITAMINB12, FOLATE, FERRITIN, TIBC, IRON, RETICCTPCT in the last 72 hours. Sepsis Labs: Recent Labs  Lab 04/27/2017 0626 04/07/2017 1152 04/15/2017 1926 04/28/2017 2145    LATICACIDVEN 2.58* 2.58* 7.8* 6.5*    Recent Results (from the past 240 hour(s))  Urine culture     Status: None   Collection Time: 04/15/2017  9:21 AM  Result Value Ref Range Status   Specimen Description   Final    URINE, RANDOM Performed at [facility] [address], [place], [place] [address]    Special Requests   Final    NONE Performed at [facility], [place] [address], [place], [place] [address]    Culture   Final    NO GROWTH Performed at [facility], [place] [address] [place], [place] [address]    Report Status 05/02/2017 FINAL  Final  Blood culture (routine x 2)     Status: None (Preliminary result)   Collection Time: 04/24/2017  9:25 AM  Result Value Ref Range Status   Specimen Description   Final    BLOOD PICC LINE Performed at [facility] [address], [place], [place] [address]    Special Requests   Final    BOTTLES DRAWN AEROBIC AND ANAEROBIC Blood Culture adequate volume Performed at [facility] [address] [place], [place] [address]    Culture   Final    NO GROWTH < 24 HOURS Performed at [facility] [address], [place], [place] [address]    Report Status PENDING  Incomplete     Radiology Studies: Ct Abdomen Pelvis Wo Contrast  Result Date: 04/13/2017 CLINICAL DATA:  Nausea and vomiting. Abdominal pain. Evaluate for spinal infection. Elevated lactic acid. EXAM: CT CHEST, ABDOMEN AND PELVIS WITHOUT CONTRAST TECHNIQUE: Multidetector CT imaging of the chest, abdomen and pelvis was performed following the standard protocol without IV contrast. COMPARISON:  Chest radiograph of earlier today.  No prior CTs. FINDINGS: CT CHEST FINDINGS Cardiovascular: Multifactorial degradation, including arm position, minimal motion, lack of IV contrast and lack of oral contrast in the abdomen or pelvis. A right-sided PICC line terminates at the superior caval/atrial junction.  Tortuous thoracic aorta. Normal heart size, without pericardial effusion. Lad coronary artery atherosclerosis on image 32/2. Mediastinum/Nodes: No mediastinal or definite hilar adenopathy, given limitations of unenhanced CT. Lungs/Pleura: Trace right pleural fluid or thickening. Subsegmental atelectasis at both lung bases dependently. Musculoskeletal: No acute osseous abnormality. Lower cervical spine fixation.  CT ABDOMEN PELVIS FINDINGS Hepatobiliary: Degradation continuing into the abdomen, including EKG wire and lead artifact. Grossly normal liver and gallbladder, without biliary duct dilatation. Pancreas: Normal, without mass or ductal dilatation. Spleen: Normal in size, without focal abnormality. Adrenals/Urinary Tract: Normal adrenal glands. No renal calculi. Minimal bilateral caliectasis is favored to be physiologic in the setting of bladder distension. There is symmetric mild bilateral perirenal edema. Bladder distension, despite Foley catheter in place. Stomach/Bowel: Normal stomach, without wall thickening. Large amount of right-sided colonic stool. Normal terminal ileum. Appendix not visualized. Grossly normal small bowel. Vascular/Lymphatic: Aortic and branch vessel atherosclerosis. No abdominopelvic adenopathy. Reproductive: Normal prostate. Other: No significant free fluid.  No free intraperitoneal air. Musculoskeletal: No acute osseous abnormality. Lumbosacral spondylosis. IMPRESSION: 1. Multifactorial moderate degradation, including lack of oral/IV contrast, motion, arm position, EKG wires and leads. 2. Given these limitations, no acute findings. 3. Bladder distension, despite catheter in place. Correlate with catheter function. Mild bilateral caliectasis is favored to be secondary. 4. Nonspecific symmetric bilateral perirenal edema. 5. Age advanced coronary artery atherosclerosis. Recommend assessment of coronary risk factors and consideration of medical therapy. 6.  Aortic Atherosclerosis  (ICD10-I70.0). 7.  Possible constipation. Electronically Signed   By: Abigail Miyamoto M.D.   On: 04/05/2017 13:24   Ct Chest Wo Contrast  Result Date: 04/28/2017 CLINICAL DATA:  Nausea and vomiting. Abdominal pain. Evaluate for spinal infection. Elevated lactic acid. EXAM: CT CHEST, ABDOMEN AND PELVIS WITHOUT CONTRAST TECHNIQUE: Multidetector CT imaging of the chest, abdomen and pelvis was performed following the standard protocol without IV contrast. COMPARISON:  Chest radiograph of earlier today.  No prior CTs. FINDINGS: CT CHEST FINDINGS Cardiovascular: Multifactorial degradation, including arm position, minimal motion, lack of IV contrast and lack of oral contrast in the abdomen or pelvis. A right-sided PICC line terminates at the superior caval/atrial junction. Tortuous thoracic aorta. Normal heart size, without pericardial effusion. Lad coronary artery atherosclerosis on image 32/2. Mediastinum/Nodes: No mediastinal or definite hilar adenopathy, given limitations of unenhanced CT. Lungs/Pleura: Trace right pleural fluid or thickening. Subsegmental atelectasis at both lung bases dependently. Musculoskeletal: No acute osseous abnormality. Lower cervical spine fixation. CT ABDOMEN PELVIS FINDINGS Hepatobiliary: Degradation continuing into the abdomen, including EKG wire and lead artifact. Grossly normal liver and gallbladder, without biliary duct dilatation. Pancreas: Normal, without mass or ductal dilatation. Spleen: Normal in size, without focal abnormality. Adrenals/Urinary Tract: Normal adrenal glands. No renal calculi. Minimal bilateral caliectasis is favored to be physiologic in the setting of bladder distension. There is symmetric mild bilateral perirenal edema. Bladder distension, despite Foley catheter in place. Stomach/Bowel: Normal stomach, without wall thickening. Large amount of right-sided colonic stool. Normal terminal ileum. Appendix not visualized. Grossly normal small bowel. Vascular/Lymphatic:  Aortic and branch vessel atherosclerosis. No abdominopelvic adenopathy. Reproductive: Normal prostate. Other: No significant free fluid.  No free intraperitoneal air. Musculoskeletal: No acute osseous abnormality. Lumbosacral spondylosis. IMPRESSION: 1. Multifactorial moderate degradation, including lack of oral/IV contrast, motion, arm position, EKG wires and leads. 2. Given these limitations, no acute findings. 3. Bladder distension, despite catheter in place. Correlate with catheter function. Mild bilateral caliectasis is favored to be secondary. 4. Nonspecific symmetric bilateral perirenal edema. 5. Age advanced coronary artery atherosclerosis. Recommend assessment of coronary risk factors and consideration of medical therapy. 6.  Aortic Atherosclerosis (ICD10-I70.0). 7.  Possible constipation. Electronically Signed   By: Abigail Miyamoto M.D.   On: 04/20/2017 13:24   Ct Cervical Spine Wo Contrast  Result Date: 04/27/2017 CLINICAL DATA:  Elevated WBC.  Evaluate  for spinal flexion. EXAM: CT CERVICAL SPINE WITHOUT CONTRAST TECHNIQUE: Multidetector CT imaging of the cervical spine was performed without intravenous contrast. Multiplanar CT image reconstructions were also generated. COMPARISON:  CT cervical spine 04/12/2017 MR cervical spine 04/01/2017 FINDINGS: Alignment: Normal. Skull base and vertebrae: No acute fracture. No primary bone lesion or focal pathologic process. Soft tissues and spinal canal: No prevertebral fluid or swelling. No visible canal hematoma. Mild soft tissue prominence along the right anterolateral aspect of C6-7 vertebral body likely reflecting resolving hematoma, but infection cannot be completely excluded. Hematoma anterior to the right sternocleidomastoid muscle in the subcutaneous fat is partially visualized and decreased in size compared with 04/12/2017. Disc levels: Prior C6 corpectomy with anterior cervical fusion from C5 through C7. severe right and moderate left foraminal stenosis  at C5-6. Moderate right foraminal stenosis at C6-7. Mild bilateral foraminal stenosis at C7-T1 and T1-2. Upper chest: Lung apices are clear. IMPRESSION: 1. Prior C6 corpectomy with anterior cervical fusion from C5 through C7. severe right and moderate left foraminal stenosis at C5-6. Moderate right foraminal stenosis at C6-7. Mild soft tissue prominence along the right anterolateral aspect of C6-7 vertebral body likely reflecting resolving hematoma, but infection cannot be completely excluded. 2. Hematoma anterior to the right sternocleidomastoid muscle in the subcutaneous fat is partially visualized and decreased in size compared with 04/12/2017. Electronically Signed   By: Kathreen Devoid   On: 04/13/2017 13:23   Dg Chest Portable 1 View  Result Date: 04/26/2017 CLINICAL DATA:  Chills.  Acute kidney injury. EXAM: PORTABLE CHEST 1 VIEW COMPARISON:  04/01/2017 FINDINGS: Right upper extremity PICC with tip at the SVC. Normal heart size and mediastinal contours. The lungs are clear and well aerated. No acute osseous finding. IMPRESSION: No evidence of active disease. Electronically Signed   By: Monte Fantasia M.D.   On: 04/03/2017 09:56    Scheduled Meds: . amiodarone  200 mg Oral Daily  . dexamethasone  3 mg Oral Q8H  . diltiazem  30 mg Oral Q8H  . insulin aspart  10 Units Subcutaneous TID WC  . pantoprazole  40 mg Oral Daily   Continuous Infusions: . ceFEPime (MAXIPIME) IV Stopped (04/13/2017 2312)  . DAPTOmycin (CUBICIN)  IV 1,000 mg (05/02/17 1325)  .  sodium bicarbonate  infusion 1000 mL 125 mL/hr at 05/02/17 1111     LOS: 1 day   Marylu Lund, MD [facility] Pager [phone]  If 7PM-7AM, please contact night-coverage [email or web address] Password [facility] 05/02/2017, 3:48 PM

## 2017-05-02 NOTE — Progress Notes (Addendum)
Pharmacy Antibiotic Note  KODE HEMMEN is a 52 y.o. male admitted on 04/05/2017 with spinal abscess. Patient began treatment with vancomycin and cefepime on 3/8 for same, (intended to continue through 4/14) but found to have new AKI.  Pharmacy has been consulted for Cefepime dosing.  Today, 05/02/2017:  Seen by ID, will add daptomycin to replace vancomycin  WBC still elevated; Afebrile  All Cx remain negative so far  Baseline CK wnl  Plan:  Continue Cefepime 1 g IV q24 hr  Daptomycin 1000 mg IV q48 hr (8.3 mg/kg TBW)  SCr q48 hr while in AKI; weekly CK while on Dapto  Per ID, continue treatment through 4/14 as originally planned  Will place OPAT orders once patient stable for discharge   Weight: 265 lb 6.4 oz (120.4 kg)  Temp (24hrs), Avg:98.3 F (36.8 C), Min:97.5 F (36.4 C), Max:99.3 F (37.4 C)  Recent Labs  Lab 04/26/17 04/29/17 04/30/17 04/06/2017 0946 04/14/2017 0953 04/23/2017 1152 04/05/2017 1926 04/05/2017 2145 05/02/17 0411  WBC 12.3  --   --  23.3*  --   --   --   --  22.8*  CREATININE 1.5* 3.1* 4.4* 5.68*  --   --  5.99*  --  6.17*  LATICACIDVEN  --   --   --   --  2.58* 2.58* 7.8* 6.5*  --     Estimated Creatinine Clearance: 20.5 mL/min (A) (by C-G formula based on SCr of 6.17 mg/dL (H)).    No Known Allergies  Antimicrobials this admission: 3/8 Cefepime >>  3/8 Vanc >> 3/29 3/31Dapto >>   Dose adjustments this admission: n/a  Microbiology results: 3/6 BCx: NGF 3/30 UCx: sent 3/30 BCx PICC line: ngtd   Thank you for allowing pharmacy to be a part of this patient's care.  Bernadene Person, PharmD, BCPS [phone] 05/02/2017, 11:47 AM

## 2017-05-02 NOTE — Progress Notes (Signed)
CRITICAL VALUE ALERT  Critical Value:  K 6.5  Date & Time Notied: 05/02/2017 5:55 AM   Provider Notified: Bodenheimer Np   Orders Received/Actions taken: None at this time

## 2017-05-03 ENCOUNTER — Other Ambulatory Visit: Payer: Self-pay

## 2017-05-03 ENCOUNTER — Encounter (HOSPITAL_COMMUNITY): Payer: Self-pay

## 2017-05-03 DIAGNOSIS — E860 Dehydration: Secondary | ICD-10-CM

## 2017-05-03 LAB — RENAL FUNCTION PANEL
ALBUMIN: 2 g/dL — AB (ref 3.5–5.0)
ALBUMIN: 2 g/dL — AB (ref 3.5–5.0)
ANION GAP: 17 — AB (ref 5–15)
ANION GAP: 17 — AB (ref 5–15)
Albumin: 2 g/dL — ABNORMAL LOW (ref 3.5–5.0)
Albumin: 2 g/dL — ABNORMAL LOW (ref 3.5–5.0)
Anion gap: 14 (ref 5–15)
Anion gap: 16 — ABNORMAL HIGH (ref 5–15)
BUN: 107 mg/dL — ABNORMAL HIGH (ref 6–20)
BUN: 108 mg/dL — ABNORMAL HIGH (ref 6–20)
BUN: 109 mg/dL — AB (ref 6–20)
BUN: 89 mg/dL — AB (ref 6–20)
CALCIUM: 7.7 mg/dL — AB (ref 8.9–10.3)
CALCIUM: 7.4 mg/dL — AB (ref 8.9–10.3)
CHLORIDE: 102 mmol/L (ref 101–111)
CO2: 17 mmol/L — ABNORMAL LOW (ref 22–32)
CO2: 17 mmol/L — ABNORMAL LOW (ref 22–32)
CO2: 19 mmol/L — ABNORMAL LOW (ref 22–32)
CO2: 20 mmol/L — ABNORMAL LOW (ref 22–32)
CREATININE: 7 mg/dL — AB (ref 0.61–1.24)
CREATININE: 7.08 mg/dL — AB (ref 0.61–1.24)
Calcium: 7.9 mg/dL — ABNORMAL LOW (ref 8.9–10.3)
Calcium: 7.6 mg/dL — ABNORMAL LOW (ref 8.9–10.3)
Chloride: 108 mmol/L (ref 101–111)
Chloride: 101 mmol/L (ref 101–111)
Chloride: 98 mmol/L — ABNORMAL LOW (ref 101–111)
Creatinine, Ser: 6.96 mg/dL — ABNORMAL HIGH (ref 0.61–1.24)
Creatinine, Ser: 6.92 mg/dL — ABNORMAL HIGH (ref 0.61–1.24)
GFR calc Af Amer: 10 mL/min — ABNORMAL LOW (ref >60)
GFR calc Af Amer: 10 mL/min — ABNORMAL LOW (ref >60)
GFR calc Af Amer: 9 mL/min — ABNORMAL LOW (ref >60)
GFR calc non Af Amer: 8 mL/min — ABNORMAL LOW (ref >60)
GFR, EST AFRICAN AMERICAN: 10 mL/min — AB (ref >60)
GFR, EST NON AFRICAN AMERICAN: 8 mL/min — AB (ref >60)
GFR, EST NON AFRICAN AMERICAN: 8 mL/min — AB (ref >60)
GFR, EST NON AFRICAN AMERICAN: 8 mL/min — AB (ref >60)
GLUCOSE: 234 mg/dL — AB (ref 65–99)
GLUCOSE: 259 mg/dL — AB (ref 65–99)
Glucose, Bld: 170 mg/dL — ABNORMAL HIGH (ref 65–99)
Glucose, Bld: 238 mg/dL — ABNORMAL HIGH (ref 65–99)
PHOSPHORUS: 7.5 mg/dL — AB (ref 2.5–4.6)
PHOSPHORUS: 7.2 mg/dL — AB (ref 2.5–4.6)
Phosphorus: 7.7 mg/dL — ABNORMAL HIGH (ref 2.5–4.6)
Phosphorus: 6.9 mg/dL — ABNORMAL HIGH (ref 2.5–4.6)
Potassium: 5.2 mmol/L — ABNORMAL HIGH (ref 3.5–5.1)
Potassium: 4.5 mmol/L (ref 3.5–5.1)
Potassium: 4.1 mmol/L (ref 3.5–5.1)
Potassium: 3.7 mmol/L (ref 3.5–5.1)
SODIUM: 137 mmol/L (ref 135–145)
SODIUM: 134 mmol/L — AB (ref 135–145)
Sodium: 139 mmol/L (ref 135–145)
Sodium: 136 mmol/L (ref 135–145)

## 2017-05-03 LAB — GLUCOSE, CAPILLARY
GLUCOSE-CAPILLARY: 251 mg/dL — AB (ref 65–99)
Glucose-Capillary: 242 mg/dL — ABNORMAL HIGH (ref 65–99)
Glucose-Capillary: 243 mg/dL — ABNORMAL HIGH (ref 65–99)
Glucose-Capillary: 246 mg/dL — ABNORMAL HIGH (ref 65–99)

## 2017-05-03 MED ORDER — HYDROCOD POLST-CPM POLST ER 10-8 MG/5ML PO SUER
5.0000 mL | Freq: Once | ORAL | Status: AC
  Administered 2017-05-03: 5 mL via ORAL
  Filled 2017-05-03: qty 5

## 2017-05-03 MED ORDER — GLUCERNA SHAKE PO LIQD
237.0000 mL | Freq: Two times a day (BID) | ORAL | Status: DC
  Administered 2017-05-03 – 2017-05-17 (×16): 237 mL via ORAL
  Filled 2017-05-03 (×5): qty 237

## 2017-05-03 NOTE — Progress Notes (Addendum)
Initial Nutrition Assessment  DOCUMENTATION CODES:   Obesity unspecified  INTERVENTION:   Glucerna Shake po BID, each supplement provides 220 kcal and 10 grams of protein  NUTRITION DIAGNOSIS:   Inadequate oral intake related to poor appetite as evidenced by per patient/family report.  GOAL:   Patient will meet greater than or equal to 90% of their needs  MONITOR:   PO intake, Supplement acceptance, Weight trends, I & O's, Labs  REASON FOR ASSESSMENT:   Low Braden    ASSESSMENT:   Pt with PMH of HTN, DM, MS, recent quadriplegia, s/p decompression spinal surgery on C5, C6 and C7 (03/25/17) developing multiple issues post op including new Afib and prevertebral hematoma at surgical sight. Pt presents with abdominal pain found AKI   Pt reports his appetite has been low but is slowing coming back up. No meal completion records available this admission. Pt unable to remember if he ate breakfast this morning or not. Pt unsure of any weight changes.  Pt amenable to nutritional supplementation while admitted given risk of lean muscle mass loss and well as skin breakdown.   Labs reviewed; CBG 70-243, BUN 107, Creatinine 6.92, Phosphorus 7.5, Albumin 2.0 Medications reviewed; Decadron, sliding scale insulin, Protonix, sodium bicarbonate in D5 at 125 mL/hr   NUTRITION - FOCUSED PHYSICAL EXAM:    Most Recent Value  Orbital Region  No depletion  Upper Arm Region  No depletion  Thoracic and Lumbar Region  No depletion  Buccal Region  No depletion  Temple Region  No depletion  Clavicle Bone Region  Mild depletion  Clavicle and Acromion Bone Region  No depletion  Scapular Bone Region  Unable to assess  Dorsal Hand  Mild depletion  Patellar Region  No depletion  Anterior Thigh Region  No depletion  Posterior Calf Region  No depletion  Edema (RD Assessment)  None     Diet Order:  DIET SOFT Room service appropriate? Yes; Fluid consistency: Thin  EDUCATION NEEDS:   Not  appropriate for education at this time  Skin:  Skin Assessment: Skin Integrity Issues: Skin Integrity Issues:: Stage II Stage II: buttocks  Last BM:  05/03/17  Height:   Ht Readings from Last 1 Encounters:  05/03/17 6\' 4"  (1.93 m)   Weight:   Wt Readings from Last 1 Encounters:  05/03/17 265 lb 6.4 oz (120.4 kg)   Ideal Body Weight:  91.8 kg  BMI:  Body mass index is 32.31 kg/m.  Estimated Nutritional Needs:   Kcal:  2200-2400  Protein:  120-135 grams  Fluid:  >/= 2.2 L/d  Fransisca Kaufmann, MS, RDN, LDN 05/03/2017 12:41 PM

## 2017-05-03 NOTE — Progress Notes (Signed)
PROGRESS NOTE    Dylan [name]  FWY:[id number] DOB: Sep 21, 1965 DOA: 04/21/2017 PCP: Patient, No Pcp Per    Brief Narrative:  52 y.o. male with medical history significant of recent quadriplegia, MS, decompression of spinal abscess admitted with abdominal pain and hypoglycemia.  He was diagnosed with MS and spinal stenosis .  He was admitted to neurosurgery 04/12/2017.  He had surgery at C6 C5 and C7.  Postoperative course was complicated with atrial fibrillation placed on Eliquis followed by which epidural clot versus abscess was seen on his C-spine MRI.  Because of the high risk patient decided not to repeat exploratory surgery.  He was started on IV antibiotics due to complicated UTI possible cervical abscess and was discharged to skilled nursing facility on 04/09/2017 patient was supposed to be on vancomycin and cefepime until May 06, 2017.  But the vancomycin was stopped earlier due to increasing creatinine. Patient was found to have gradual progressive swelling of his neck a CT scan at that time showed prevertebral hematoma.  Patient presented to ED with abd pain, found to have Cr of 5.68 and concerns for non-draining foley cath. After removing foreign body, cath yielded 2.5L urine. Patient also noted to have K in excess of 6.4, given kayexelate. Patient admitted for further work up.  Assessment & Plan:   Principal Problem:   AKI (acute kidney injury) ([place]) Active Problems:   Uncontrolled diabetes mellitus type 2 without complications ([place])   Hypertension   Morbid obesity ([place])   Paroxysmal atrial fibrillation ([facility])   Multiple sclerosis exacerbation ([facility])   Pressure injury of skin   Hyperkalemia   Hematoma   Insomnia   Adjustment reaction with anxiety and depression   Quadriplegia ([place])   History of fusion of cervical spine   Postoperative infection   Coronary artery disease   Atherosclerotic peripheral vascular disease ([place])  1] acute kidney injury -Markedly worsened renal  function noted over baseline at time of presentation -Concerns for post-obstructive disease vs nephrotoxicity given recent vanc for possible epidural abscess. Vancomycin discontinued -Patient seen by urology with foreign body removed with resultant 2L urine output -Nephrology following. Cr continuing to trend upwards -Random vanc elevated at 57 -Patient has been continued on bicarb gtt  2] leukocytosis with positive UA -urine culture ordered -Admitting physician discussed with Dr. Megan Salon infectious disease who has seen pt in consultation -Continued cefepime as tolerated  3] type 2 diabetes  -metformin on hold given renal function -Glucose in the 200's -Will continue on lower dose of lantus given renal function at 20 units daily, from 70 units -Will continue SSI coverage, sensitive scale given renal function  4] Possible epidural abscess -Pt known to neurosurgical service -concerns for possible epidural abscess vs hematoma, noted to be a suboptimal surgical candidate with plans to empirically treat -Pt had been on cefepime and vanc with abx planned through 4/4 -Vanc discontinued given concerns of nephrotoxicity. Patient now on daptomycin per ID -Blood culture neg reviewed, negative  5] Constipation -CT reviewed. Stool and increased bowel gas noted -Multiple doses of kayexelate were given with no effect this AM -Patient has hx of chronic constipation at baseline -Good results with soap suds enema. Patient reports feeling better  6] Hyperkalemia - Resolved - Repeat bmet in AM  DVT prophylaxis: SCD's Code Status: DNR Family Communication: Pt in room, family not at bedside Disposition Plan: Uncertain at this time  Consultants:   Urology  Nephrology  ID  Procedures:     Antimicrobials: Anti-infectives (  From admission, onward)   Start     Dose/Rate Route Frequency Ordered Stop   05/02/17 1200  DAPTOmycin (CUBICIN) 1,000 mg in sodium chloride 0.9 % IVPB     1,000  mg 240 mL/hr over 30 Minutes Intravenous Every 48 hours 05/02/17 1143     04/11/2017 1900  ceFEPIme (MAXIPIME) 1 g in sodium chloride 0.9 % 100 mL IVPB     1 g 200 mL/hr over 30 Minutes Intravenous Daily after supper 05/02/2017 1833     04/18/2017 1800  ceFEPime (MAXIPIME) IVPB  Status:  Discontinued    Note to Pharmacy:  Indication:  Spinal abscess Last Day of Therapy:  05/16/2017 Labs - Once weekly:  CBC/D and BMP, Labs - Every other week:  ESR and CRP     2 g Intravenous Every 12 hours 04/23/2017 1757 04/27/2017 1833   04/07/2017 1245  vancomycin (VANCOCIN) 2,000 mg in sodium chloride 0.9 % 500 mL IVPB     2,000 mg 250 mL/hr over 120 Minutes Intravenous  Once 04/06/2017 1231 04/22/2017 1757   04/09/2017 1230  piperacillin-tazobactam (ZOSYN) IVPB 3.375 g     3.375 g 100 mL/hr over 30 Minutes Intravenous  Once 04/26/2017 1217 04/29/2017 1407   04/29/2017 1230  vancomycin (VANCOCIN) IVPB 1000 mg/200 mL premix  Status:  Discontinued     1,000 mg 200 mL/hr over 60 Minutes Intravenous  Once 04/30/2017 1217 04/29/2017 1231      Subjective: Reports relief since moving bowels  Objective: Vitals:   05/02/17 2027 05/03/17 0042 05/03/17 0400 05/03/17 0814  BP: (!) 148/77  140/77 (!) 149/86  Pulse: (!) 106  95 93  Resp: 18 18 16    Temp: 98.7 F (37.1 C)  98.6 F (37 C)   TempSrc: Oral  Oral   SpO2: 100%  100% 100%  Weight:  120.4 kg (265 lb 6.4 oz)    Height:  6' 4"  (1.93 m)      Intake/Output Summary (Last 24 hours) at 05/03/2017 1251 Last data filed at 05/03/2017 1206 Gross per 24 hour  Intake 3272.08 ml  Output 1250 ml  Net 2022.08 ml   Filed Weights   04/18/2017 0855 04/14/2017 1909 05/03/17 0042  Weight: 111.6 kg (246 lb) 120.4 kg (265 lb 6.4 oz) 120.4 kg (265 lb 6.4 oz)    Examination: General exam: Awake, laying in bed, in nad Respiratory system: Normal respiratory effort, no wheezing Cardiovascular system: regular rate, s1, s2 Gastrointestinal system: Soft, nondistended, positive BS Central  nervous system: CN2-12 grossly intact, strength intact Extremities: Perfused, no clubbing Skin: Normal skin turgor, no notable skin lesions seen Psychiatry: Mood normal // no visual hallucinations   Data Reviewed: I have personally reviewed following labs and imaging studies  CBC: Recent Labs  Lab 04/08/2017 0946 05/02/17 0411  WBC 23.3* 22.8*  NEUTROABS 20.7*  --   HGB 11.8* 11.0*  HCT 33.9* 32.4*  MCV 86.5 86.4  PLT 119* 98*   Basic Metabolic Panel: Recent Labs  Lab 05/02/17 0411 05/02/17 1231 05/02/17 1831 05/03/17 0022 05/03/17 0804  NA 133* 137 138 139 136  K 6.5* 5.9* 5.5* 5.2* 4.5  CL 104 107 107 108 102  CO2 12* 15* 15* 17* 17*  GLUCOSE 75 83 108* 170* 234*  BUN 104* 114* 106* 109* 107*  CREATININE 6.17* 6.40* 6.79* 6.96* 6.92*  CALCIUM 8.3* 8.1* 8.0* 7.9* 7.6*  PHOS  --  7.9* 7.3* 7.7* 7.5*   GFR: Estimated Creatinine Clearance: 18.3 mL/min (A) (by C-G formula  based on SCr of 6.92 mg/dL (H)). Liver Function Tests: Recent Labs  Lab 04/22/2017 0946 05/02/17 1231 05/02/17 1831 05/03/17 0022 05/03/17 0804  AST 20  --   --   --   --   ALT 35  --   --   --   --   ALKPHOS 57  --   --   --   --   BILITOT 0.7  --   --   --   --   PROT 6.0*  --   --   --   --   ALBUMIN 2.4* 2.2* 2.0* 2.0* 2.0*   Recent Labs  Lab 04/29/2017 0946  LIPASE 25   No results for input(s): AMMONIA in the last 168 hours. Coagulation Profile: No results for input(s): INR, PROTIME in the last 168 hours. Cardiac Enzymes: Recent Labs  Lab 04/14/2017 0946  CKTOTAL 36*   BNP (last 3 results) No results for input(s): PROBNP in the last 8760 hours. HbA1C: No results for input(s): HGBA1C in the last 72 hours. CBG: Recent Labs  Lab 05/02/17 1144 05/02/17 1742 05/02/17 2030 05/03/17 0737 05/03/17 1207  GLUCAP 70 99 132* 242* 243*   Lipid Profile: No results for input(s): CHOL, HDL, LDLCALC, TRIG, CHOLHDL, LDLDIRECT in the last 72 hours. Thyroid Function Tests: No results for  input(s): TSH, T4TOTAL, FREET4, T3FREE, THYROIDAB in the last 72 hours. Anemia Panel: No results for input(s): VITAMINB12, FOLATE, FERRITIN, TIBC, IRON, RETICCTPCT in the last 72 hours. Sepsis Labs: Recent Labs  Lab 04/19/2017 8832 04/28/2017 1152 04/06/2017 1926 04/29/2017 2145  LATICACIDVEN 2.58* 2.58* 7.8* 6.5*    Recent Results (from the past 240 hour(s))  Urine culture     Status: None   Collection Time: 05/02/2017  9:21 AM  Result Value Ref Range Status   Specimen Description   Final    URINE, RANDOM Performed at [facility] [address], [place], [place] [address]    Special Requests   Final    NONE Performed at [facility], [place] [address], [place], [place] [address]    Culture   Final    NO GROWTH Performed at [facility], [place] [address] [place], [place] [address]    Report Status 05/02/2017 FINAL  Final  Blood culture (routine x 2)     Status: None (Preliminary result)   Collection Time: 04/19/2017  9:25 AM  Result Value Ref Range Status   Specimen Description   Final    BLOOD PICC LINE Performed at [facility] [address] [place], [place] [address]    Special Requests   Final    BOTTLES DRAWN AEROBIC AND ANAEROBIC Blood Culture adequate volume Performed at [facility] [address], [place], [place] [address]    Culture   Final    NO GROWTH < 24 HOURS Performed at [facility] [address], [place], [place] [address]    Report Status PENDING  Incomplete     Radiology Studies: Ct Abdomen Pelvis Wo Contrast  Result Date: 04/14/2017 CLINICAL DATA:  Nausea and vomiting. Abdominal pain. Evaluate for spinal infection. Elevated lactic acid. EXAM: CT CHEST, ABDOMEN AND PELVIS WITHOUT CONTRAST TECHNIQUE: Multidetector CT imaging of the chest, abdomen and pelvis was performed following the standard protocol without IV contrast. COMPARISON:  Chest radiograph of earlier  today.  No prior CTs. FINDINGS: CT CHEST FINDINGS Cardiovascular: Multifactorial degradation, including arm position, minimal motion, lack of IV  contrast and lack of oral contrast in the abdomen or pelvis. A right-sided PICC line terminates at the superior caval/atrial junction. Tortuous thoracic aorta. Normal heart size, without pericardial effusion. Lad coronary artery atherosclerosis on image 32/2. Mediastinum/Nodes: No mediastinal or definite hilar adenopathy, given limitations of unenhanced CT. Lungs/Pleura: Trace right pleural fluid or thickening. Subsegmental atelectasis at both lung bases dependently. Musculoskeletal: No acute osseous abnormality. Lower cervical spine fixation. CT ABDOMEN PELVIS FINDINGS Hepatobiliary: Degradation continuing into the abdomen, including EKG wire and lead artifact. Grossly normal liver and gallbladder, without biliary duct dilatation. Pancreas: Normal, without mass or ductal dilatation. Spleen: Normal in size, without focal abnormality. Adrenals/Urinary Tract: Normal adrenal glands. No renal calculi. Minimal bilateral caliectasis is favored to be physiologic in the setting of bladder distension. There is symmetric mild bilateral perirenal edema. Bladder distension, despite Foley catheter in place. Stomach/Bowel: Normal stomach, without wall thickening. Large amount of right-sided colonic stool. Normal terminal ileum. Appendix not visualized. Grossly normal small bowel. Vascular/Lymphatic: Aortic and branch vessel atherosclerosis. No abdominopelvic adenopathy. Reproductive: Normal prostate. Other: No significant free fluid.  No free intraperitoneal air. Musculoskeletal: No acute osseous abnormality. Lumbosacral spondylosis. IMPRESSION: 1. Multifactorial moderate degradation, including lack of oral/IV contrast, motion, arm position, EKG wires and leads. 2. Given these limitations, no acute findings. 3. Bladder distension, despite catheter in place. Correlate with catheter  function. Mild bilateral caliectasis is favored to be secondary. 4. Nonspecific symmetric bilateral perirenal edema. 5. Age advanced coronary artery atherosclerosis. Recommend assessment of coronary risk factors and consideration of medical therapy. 6.  Aortic Atherosclerosis (ICD10-I70.0). 7.  Possible constipation. Electronically Signed   By: Abigail Miyamoto M.D.   On: 04/30/2017 13:24   Ct Chest Wo Contrast  Result Date: 04/26/2017 CLINICAL DATA:  Nausea and vomiting. Abdominal pain. Evaluate for spinal infection. Elevated lactic acid. EXAM: CT CHEST, ABDOMEN AND PELVIS WITHOUT CONTRAST TECHNIQUE: Multidetector CT imaging of the chest, abdomen and pelvis was performed following the standard protocol without IV contrast. COMPARISON:  Chest radiograph of earlier today.  No prior CTs. FINDINGS: CT CHEST FINDINGS Cardiovascular: Multifactorial degradation, including arm position, minimal motion, lack of IV contrast and lack of oral contrast in the abdomen or pelvis. A right-sided PICC line terminates at the superior caval/atrial junction. Tortuous thoracic aorta. Normal heart size, without pericardial effusion. Lad coronary artery atherosclerosis on image 32/2. Mediastinum/Nodes: No mediastinal or definite hilar adenopathy, given limitations of unenhanced CT. Lungs/Pleura: Trace right pleural fluid or thickening. Subsegmental atelectasis at both lung bases dependently. Musculoskeletal: No acute osseous abnormality. Lower cervical spine fixation. CT ABDOMEN PELVIS FINDINGS Hepatobiliary: Degradation continuing into the abdomen, including EKG wire and lead artifact. Grossly normal liver and gallbladder, without biliary duct dilatation. Pancreas: Normal, without mass or ductal dilatation. Spleen: Normal in size, without focal abnormality. Adrenals/Urinary Tract: Normal adrenal glands. No renal calculi. Minimal bilateral caliectasis is favored to be physiologic in the setting of bladder distension. There is symmetric  mild bilateral perirenal edema. Bladder distension, despite Foley catheter in place. Stomach/Bowel: Normal stomach, without wall thickening. Large amount of right-sided colonic stool. Normal terminal ileum. Appendix not visualized. Grossly normal small bowel. Vascular/Lymphatic: Aortic and branch vessel atherosclerosis. No abdominopelvic adenopathy. Reproductive: Normal prostate. Other: No significant free fluid.  No free intraperitoneal air. Musculoskeletal: No acute osseous abnormality. Lumbosacral spondylosis. IMPRESSION: 1. Multifactorial moderate degradation, including lack of oral/IV contrast, motion, arm position, EKG wires and leads. 2. Given these limitations, no acute findings. 3. Bladder distension, despite catheter in place. Correlate with catheter function.  Mild bilateral caliectasis is favored to be secondary. 4. Nonspecific symmetric bilateral perirenal edema. 5. Age advanced coronary artery atherosclerosis. Recommend assessment of coronary risk factors and consideration of medical therapy. 6.  Aortic Atherosclerosis (ICD10-I70.0). 7.  Possible constipation. Electronically Signed   By: Abigail Miyamoto M.D.   On: 04/30/2017 13:24   Ct Cervical Spine Wo Contrast  Result Date: 04/09/2017 CLINICAL DATA:  Elevated WBC.  Evaluate for spinal flexion. EXAM: CT CERVICAL SPINE WITHOUT CONTRAST TECHNIQUE: Multidetector CT imaging of the cervical spine was performed without intravenous contrast. Multiplanar CT image reconstructions were also generated. COMPARISON:  CT cervical spine 04/12/2017 MR cervical spine 04/01/2017 FINDINGS: Alignment: Normal. Skull base and vertebrae: No acute fracture. No primary bone lesion or focal pathologic process. Soft tissues and spinal canal: No prevertebral fluid or swelling. No visible canal hematoma. Mild soft tissue prominence along the right anterolateral aspect of C6-7 vertebral body likely reflecting resolving hematoma, but infection cannot be completely excluded.  Hematoma anterior to the right sternocleidomastoid muscle in the subcutaneous fat is partially visualized and decreased in size compared with 04/12/2017. Disc levels: Prior C6 corpectomy with anterior cervical fusion from C5 through C7. severe right and moderate left foraminal stenosis at C5-6. Moderate right foraminal stenosis at C6-7. Mild bilateral foraminal stenosis at C7-T1 and T1-2. Upper chest: Lung apices are clear. IMPRESSION: 1. Prior C6 corpectomy with anterior cervical fusion from C5 through C7. severe right and moderate left foraminal stenosis at C5-6. Moderate right foraminal stenosis at C6-7. Mild soft tissue prominence along the right anterolateral aspect of C6-7 vertebral body likely reflecting resolving hematoma, but infection cannot be completely excluded. 2. Hematoma anterior to the right sternocleidomastoid muscle in the subcutaneous fat is partially visualized and decreased in size compared with 04/12/2017. Electronically Signed   By: Kathreen Devoid   On: 04/13/2017 13:23    Scheduled Meds: . amiodarone  200 mg Oral Daily  . dexamethasone  3 mg Oral Q8H  . diltiazem  30 mg Oral Q8H  . feeding supplement (GLUCERNA SHAKE)  237 mL Oral BID BM  . insulin aspart  0-5 Units Subcutaneous QHS  . insulin aspart  0-9 Units Subcutaneous TID WC  . pantoprazole  40 mg Oral Daily   Continuous Infusions: . ceFEPime (MAXIPIME) IV Stopped (05/02/17 1755)  . DAPTOmycin (CUBICIN)  IV Stopped (05/02/17 1355)  .  sodium bicarbonate  infusion 1000 mL 125 mL/hr at 05/03/17 0507     LOS: 2 days   Marylu Lund, MD [facility] Pager [phone]  If 7PM-7AM, please contact night-coverage [email or web address] Password [email or web address] 05/03/2017, 12:51 PM

## 2017-05-03 NOTE — Progress Notes (Addendum)
Patient had minimal output all day and after 1200 noon no output even with irrigation. Bladder scan showed . MD notified and got another order for coude cath 17F. Once I replaced the coude cath there was immediate output of up to . With a post catheter residual of 62ml. Will continue to monitor patient.

## 2017-05-03 NOTE — Progress Notes (Signed)
[facility] Progress Note  Subjective: no new c/o  Vitals:   05/03/17 0042 05/03/17 0400 05/03/17 0814 05/03/17 1404  BP:  140/77 (!) 149/86 (!) 151/72  Pulse:  95 93 90  Resp: 18 16  16   Temp:  98.6 F (37 C)  98.3 F (36.8 C)  TempSrc:  Oral  Oral  SpO2:  100% 100% 98%  Weight: 120.4 kg (265 lb 6.4 oz)     Height: 6\' 4"  (1.93 m)       Inpatient medications: . amiodarone  200 mg Oral Daily  . dexamethasone  3 mg Oral Q8H  . diltiazem  30 mg Oral Q8H  . feeding supplement (GLUCERNA SHAKE)  237 mL Oral BID BM  . insulin aspart  0-5 Units Subcutaneous QHS  . insulin aspart  0-9 Units Subcutaneous TID WC  . pantoprazole  40 mg Oral Daily   . ceFEPime (MAXIPIME) IV Stopped (05/02/17 1755)  . DAPTOmycin (CUBICIN)  IV Stopped (05/02/17 1355)  .  sodium bicarbonate  infusion 1000 mL 125 mL/hr at 05/03/17 0507   acetaminophen, bisacodyl, LORazepam, methocarbamol, ondansetron (ZOFRAN) IV, ondansetron, sodium chloride flush, traZODone  Exam: No distress, lying flat No jvd Chest clear bilat RRR  Abd soft obese no ascites Ext no edema LE's or UE's Neuro gen'd weakness x 4 ext  CXR 3/30 - clear UA 04/01/17 > prot 30, tntc rbc/ wbc's, 1.017 UA 04/08/17 > prot neg, 1.010, many bact, tntc rbc/ wbc UA 05/02/2017 > prot 30, 1.015, few bact, 6-30 rbc/ tntc wbc UNa 3/7 > 79 UCr 3/7 > 34.82 CT abd 3/30 > Adrenals/Urinary Tract: Normal adrenal glands. No renal calculi. Minimal bilateral caliectasis is favored to be physiologic in the setting of bladder distension. There is symmetric mild bilateral perirenal edema. Bladder distension, despite Foley catheter in place.   Impression: 1  AKI on prob CKD: obstructed foley/ uropathy and/ or vanc toxicity.  Very high vanc levels still.  Urology saw pt for nondraining foley.  Vanc level 57 still. He looks dry and still has room for volume, getting IVF at 125 cc/hr.  Cont supportive care, no signs of uremia.   2  Multiple sclerosis/  quadriplegia - per primary  3  Hyperkalemia: resolved w/ kayexalate and enemas    Plan - as above   Vinson Moselle MD [facility] pager [phone]   05/03/2017, 4:47 PM   Recent Labs  Lab 05/03/17 0022 05/03/17 0804 05/03/17 1331  NA 139 136 137  K 5.2* 4.5 4.1  CL 108 102 101  CO2 17* 17* 19*  GLUCOSE 170* 234* 238*  BUN 109* 107* PENDING  CREATININE 6.96* 6.92* 7.00*  CALCIUM 7.9* 7.6* 7.7*  PHOS 7.7* 7.5* 7.2*   Recent Labs  Lab 04/30/2017 0946  05/03/17 0022 05/03/17 0804 05/03/17 1331  AST 20  --   --   --   --   ALT 35  --   --   --   --   ALKPHOS 57  --   --   --   --   BILITOT 0.7  --   --   --   --   PROT 6.0*  --   --   --   --   ALBUMIN 2.4*   < > 2.0* 2.0* 2.0*   < > = values in this interval not displayed.   Recent Labs  Lab 04/17/2017 0946 05/02/17 0411  WBC 23.3* 22.8*  NEUTROABS 20.7*  --  HGB 11.8* 11.0*  HCT 33.9* 32.4*  MCV 86.5 86.4  PLT 119* 98*   Iron/TIBC/Ferritin/ %Sat No results found for: IRON, TIBC, FERRITIN, IRONPCTSAT

## 2017-05-03 DEATH — deceased

## 2017-05-04 DIAGNOSIS — F419 Anxiety disorder, unspecified: Secondary | ICD-10-CM

## 2017-05-04 DIAGNOSIS — G8250 Quadriplegia, unspecified: Secondary | ICD-10-CM

## 2017-05-04 DIAGNOSIS — F321 Major depressive disorder, single episode, moderate: Secondary | ICD-10-CM

## 2017-05-04 DIAGNOSIS — I480 Paroxysmal atrial fibrillation: Secondary | ICD-10-CM

## 2017-05-04 DIAGNOSIS — G4700 Insomnia, unspecified: Secondary | ICD-10-CM

## 2017-05-04 DIAGNOSIS — E1165 Type 2 diabetes mellitus with hyperglycemia: Secondary | ICD-10-CM

## 2017-05-04 DIAGNOSIS — Z981 Arthrodesis status: Secondary | ICD-10-CM

## 2017-05-04 DIAGNOSIS — R450 Nervousness: Secondary | ICD-10-CM

## 2017-05-04 LAB — RENAL FUNCTION PANEL
ANION GAP: 16 — AB (ref 5–15)
Albumin: 1.9 g/dL — ABNORMAL LOW (ref 3.5–5.0)
Albumin: 1.9 g/dL — ABNORMAL LOW (ref 3.5–5.0)
Anion gap: 18 — ABNORMAL HIGH (ref 5–15)
BUN: 108 mg/dL — ABNORMAL HIGH (ref 6–20)
BUN: 100 mg/dL — AB (ref 6–20)
CALCIUM: 7.5 mg/dL — AB (ref 8.9–10.3)
CO2: 22 mmol/L (ref 22–32)
CO2: 18 mmol/L — AB (ref 22–32)
CREATININE: 7.32 mg/dL — AB (ref 0.61–1.24)
Calcium: 7.5 mg/dL — ABNORMAL LOW (ref 8.9–10.3)
Chloride: 98 mmol/L — ABNORMAL LOW (ref 101–111)
Chloride: 96 mmol/L — ABNORMAL LOW (ref 101–111)
Creatinine, Ser: 7.45 mg/dL — ABNORMAL HIGH (ref 0.61–1.24)
GFR calc Af Amer: 9 mL/min — ABNORMAL LOW (ref >60)
GFR calc Af Amer: 9 mL/min — ABNORMAL LOW (ref >60)
GFR calc non Af Amer: 8 mL/min — ABNORMAL LOW (ref >60)
GFR, EST NON AFRICAN AMERICAN: 8 mL/min — AB (ref >60)
GLUCOSE: 245 mg/dL — AB (ref 65–99)
GLUCOSE: 248 mg/dL — AB (ref 65–99)
POTASSIUM: 3.7 mmol/L (ref 3.5–5.1)
Phosphorus: 6.9 mg/dL — ABNORMAL HIGH (ref 2.5–4.6)
Phosphorus: 6.6 mg/dL — ABNORMAL HIGH (ref 2.5–4.6)
Potassium: 3.7 mmol/L (ref 3.5–5.1)
SODIUM: 134 mmol/L — AB (ref 135–145)
Sodium: 134 mmol/L — ABNORMAL LOW (ref 135–145)

## 2017-05-04 LAB — MAGNESIUM: MAGNESIUM: 1.8 mg/dL (ref 1.7–2.4)

## 2017-05-04 LAB — BLOOD CULTURE ID PANEL (REFLEXED)
Acinetobacter baumannii: NOT DETECTED
CANDIDA GLABRATA: DETECTED — AB
Candida albicans: NOT DETECTED
Candida krusei: NOT DETECTED
Candida parapsilosis: NOT DETECTED
Candida tropicalis: NOT DETECTED
ENTEROBACTER CLOACAE COMPLEX: NOT DETECTED
Enterobacteriaceae species: NOT DETECTED
Enterococcus species: NOT DETECTED
Escherichia coli: NOT DETECTED
Haemophilus influenzae: NOT DETECTED
Klebsiella oxytoca: NOT DETECTED
Klebsiella pneumoniae: NOT DETECTED
Listeria monocytogenes: NOT DETECTED
NEISSERIA MENINGITIDIS: NOT DETECTED
PROTEUS SPECIES: NOT DETECTED
PSEUDOMONAS AERUGINOSA: NOT DETECTED
SERRATIA MARCESCENS: NOT DETECTED
STAPHYLOCOCCUS AUREUS BCID: NOT DETECTED
STAPHYLOCOCCUS SPECIES: NOT DETECTED
STREPTOCOCCUS AGALACTIAE: NOT DETECTED
STREPTOCOCCUS PNEUMONIAE: NOT DETECTED
Streptococcus pyogenes: NOT DETECTED
Streptococcus species: NOT DETECTED

## 2017-05-04 LAB — GLUCOSE, CAPILLARY
GLUCOSE-CAPILLARY: 239 mg/dL — AB (ref 65–99)
Glucose-Capillary: 270 mg/dL — ABNORMAL HIGH (ref 65–99)
Glucose-Capillary: 276 mg/dL — ABNORMAL HIGH (ref 65–99)
Glucose-Capillary: 255 mg/dL — ABNORMAL HIGH (ref 65–99)

## 2017-05-04 LAB — CREATININE, SERUM
CREATININE: 7.53 mg/dL — AB (ref 0.61–1.24)
GFR calc non Af Amer: 8 mL/min — ABNORMAL LOW (ref >60)
GFR, EST AFRICAN AMERICAN: 9 mL/min — AB (ref >60)

## 2017-05-04 MED ORDER — INSULIN GLARGINE 100 UNIT/ML ~~LOC~~ SOLN
10.0000 [IU] | Freq: Every day | SUBCUTANEOUS | Status: DC
  Administered 2017-05-04 – 2017-05-07 (×4): 10 [IU] via SUBCUTANEOUS
  Filled 2017-05-04 (×6): qty 0.1

## 2017-05-04 MED ORDER — DILTIAZEM HCL 30 MG PO TABS
30.0000 mg | ORAL_TABLET | Freq: Once | ORAL | Status: AC
  Administered 2017-05-04: 30 mg via ORAL
  Filled 2017-05-04: qty 1

## 2017-05-04 MED ORDER — METOPROLOL TARTRATE 12.5 MG HALF TABLET
12.5000 mg | ORAL_TABLET | Freq: Two times a day (BID) | ORAL | Status: DC
  Administered 2017-05-04 – 2017-06-10 (×70): 12.5 mg via ORAL
  Filled 2017-05-04 (×73): qty 1

## 2017-05-04 MED ORDER — MIRTAZAPINE 15 MG PO TABS
15.0000 mg | ORAL_TABLET | Freq: Every day | ORAL | Status: DC
  Administered 2017-05-04 – 2017-05-13 (×10): 15 mg via ORAL
  Filled 2017-05-04 (×10): qty 1

## 2017-05-04 MED ORDER — STERILE WATER FOR INJECTION IV SOLN
INTRAVENOUS | Status: DC
  Administered 2017-05-04 – 2017-05-05 (×2): via INTRAVENOUS
  Filled 2017-05-04 (×4): qty 850

## 2017-05-04 MED ORDER — INSULIN GLARGINE 100 UNIT/ML ~~LOC~~ SOLN
5.0000 [IU] | Freq: Every day | SUBCUTANEOUS | Status: DC
  Filled 2017-05-04: qty 0.05

## 2017-05-04 MED ORDER — LABETALOL HCL 5 MG/ML IV SOLN
5.0000 mg | INTRAVENOUS | Status: DC | PRN
  Filled 2017-05-04: qty 4

## 2017-05-04 MED ORDER — SODIUM CHLORIDE 0.9 % IV SOLN
100.0000 mg | INTRAVENOUS | Status: DC
  Administered 2017-05-06 – 2017-05-13 (×9): 100 mg via INTRAVENOUS
  Filled 2017-05-04 (×13): qty 100

## 2017-05-04 MED ORDER — SODIUM CHLORIDE 0.9 % IV SOLN
200.0000 mg | Freq: Once | INTRAVENOUS | Status: AC
  Administered 2017-05-04: 200 mg via INTRAVENOUS
  Filled 2017-05-04: qty 200

## 2017-05-04 MED ORDER — DILTIAZEM HCL 60 MG PO TABS
60.0000 mg | ORAL_TABLET | Freq: Three times a day (TID) | ORAL | Status: DC
  Administered 2017-05-04 – 2017-05-31 (×74): 60 mg via ORAL
  Filled 2017-05-04 (×83): qty 1

## 2017-05-04 NOTE — Progress Notes (Signed)
Received call from CMT.  Patient HR 150's rapid Afib.  BP 127/81.  MD notified via text page.

## 2017-05-04 NOTE — Consult Note (Addendum)
[facility] Face-to-Face Psychiatry Consult   Reason for Consult:  Depression  Referring Physician:  Dr. Wyline Copas  Patient Identification: Dylan [name] MRN:  [id number] Principal Diagnosis: MDD (major depressive disorder), single episode, moderate ([place]) Diagnosis:   Patient Active Problem List   Diagnosis Date Noted  . Quadriplegia ([place]) [G82.50] 05/02/2017  . History of fusion of cervical spine [Z98.1] 05/02/2017  . Postoperative infection [T81.40XA] 05/02/2017  . Coronary artery disease [I25.10] 05/02/2017  . Atherosclerotic peripheral vascular disease ([place]) [I70.209] 05/02/2017  . Adjustment reaction with anxiety and depression [F43.23] 04/30/2017  . Neurogenic bladder [N31.9] 04/22/2017  . Insomnia [G47.00] 04/22/2017  . GERD without esophagitis [K21.9] 04/15/2017  . Insulin dependent type 2 diabetes mellitus, uncontrolled ([place]) [E11.65, Z79.4] 04/15/2017  . Hematoma [T14.8XXA] 04/12/2017  . Hyperkalemia [E87.5] 04/08/2017  . AKI (acute kidney injury) ([place]) [N17.9] 04/08/2017  . Pressure injury of skin [L89.90] 04/03/2017  . Multiple sclerosis exacerbation ([place]) [G35] 03/30/2017  . Paroxysmal atrial fibrillation ([facility]) [I48.0]   . Spinal stenosis, multiple sites in spine [M48.00] 03/23/2017  . Morbid obesity ([place]) [E66.01] 03/22/2017  . Uncontrolled diabetes mellitus type 2 without complications ([place]) [H47.42] 03/21/2017  . Hypertension [I10] 03/21/2017    Total Time spent with patient: 1 hour  Subjective:   Dylan [name] is a 52 y.o. male patient admitted with AKI.  HPI:   Per chart review, patient was admitted with abdominal pain and was found to have a AKI. He was previously admitted from 2/17-3/18 for cord compression (C5-C6, C6-C7) due to severe stenosis and likely demyelinating disease. He had a spinal decompression surgery on 2/21 and his hospital course was complicated by new onset atrial fibrillation with RVR. Anticoagulation was started and then discontinued due to possible  hematoma found on the spinal cord. He was deemed not to be a candidate of hematoma evacuation. He was also treated for a complicated UTI. He was discharged to a SNF. Home medications include Ativan 0.5 mg q 6 hours PRN for anxiety and Melatonin 3 mg qhs for insomnia. PMP does not have any history of controlled substances.   On interview, Dylan [name] reports that he is "just trying to make it." He reports feeling depressed since his prior hospitalization. He reports poor sleep with problems falling asleep, low energy, poor appetite, weigh loss and anxiety. He reports feeling anxious about his medical condition and receiving the appropriate treatment. He denies a history of manic symptoms (decreased need for sleep, increased energy, impulsivity or pressured speech). He denies HI. He reports hallucinations associated with sleep. He denies access to weapons.   Past Psychiatric History: Denies   Risk to Self: Is patient at risk for suicide?: No Risk to Others:  None.  Denies HI. Prior Inpatient Therapy:  Denies Prior Outpatient Therapy:   Denies  Past Medical History:  Past Medical History:  Diagnosis Date  . 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 Diabetes mellitus without complication ([place])   . Hypertension   . MS (congenital mitral stenosis) 2019  . Persistent atrial fibrillation with rapid ventricular response ([place]) 03/27/2017    Past Surgical History:  Procedure Laterality Date  . ANTERIOR CERVICAL DECOMP/DISCECTOMY FUSION N/A 03/25/2017   Procedure: ANTERIOR CERVICAL DECOMPRESSION/DISCECTOMY FUSION CERVICAL FIVE- CERVICAL SIX, CERVICAL SIX- CERVICAL SEVEN, CORPECTOMY;  Surgeon: Consuella Lose, MD;  Location: [place] [facility];  Service: Neurosurgery;  Laterality: N/A;  . NOSE SURGERY     AS A CHILD   Family History:  Family History  Problem Relation Age of Onset  .  Prostate cancer Father    Family Psychiatric  History: Denies Social History:  Social History   Substance and Sexual Activity  Alcohol  Use No  . Frequency: Never     Social History   Substance and Sexual Activity  Drug Use No    Social History   Socioeconomic History  . Marital status: Single    Spouse name: Not on file  . Number of children: Not on file  . Years of education: Not on file  . Highest education level: Not on file  Occupational History  . Not on file  Social Needs  . Financial resource strain: Not on file  . Food insecurity:    Worry: Not on file    Inability: Not on file  . Transportation needs:    Medical: Not on file    Non-medical: Not on file  Tobacco Use  . Smoking status: Never Smoker  . Smokeless tobacco: Never Used  Substance and Sexual Activity  . Alcohol use: No    Frequency: Never  . Drug use: No  . Sexual activity: Not on file  Lifestyle  . Physical activity:    Days per week: Not on file    Minutes per session: Not on file  . Stress: Not on file  Relationships  . Social connections:    Talks on phone: Not on file    Gets together: Not on file    Attends religious service: Not on file    Active member of club or organization: Not on file    Attends meetings of clubs or organizations: Not on file    Relationship status: Not on file  Other Topics Concern  . Not on file  Social History Narrative  . Not on file   Additional Social History: He lives at home.  He is single.  He has no children.  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  He denies alcohol or illicit substance use.    Allergies:  No Known Allergies  Labs:  Results for orders placed or performed during the hospital encounter of 04/08/2017 (from the past 48 hour(s))  Renal function panel     Status: Abnormal   Collection Time: 05/02/17 12:31 PM  Result Value Ref Range   Sodium 137 135 - 145 mmol/L   Potassium 5.9 (H) 3.5 - 5.1 mmol/L   Chloride 107 101 - 111 mmol/L   CO2 15 (L) 22 - 32 mmol/L   Glucose, Bld 83 65 - 99 mg/dL   BUN 114 (H) 6 - 20  mg/dL    Comment: RESULTS CONFIRMED BY MANUAL DILUTION   Creatinine, Ser 6.40 (H) 0.61 - 1.24 mg/dL   Calcium 8.1 (L) 8.9 - 10.3 mg/dL   Phosphorus 7.9 (H) 2.5 - 4.6 mg/dL   Albumin 2.2 (L) 3.5 - 5.0 g/dL   GFR calc non Af Amer 9 (L) >60 mL/min   GFR calc Af Amer 11 (L) >60 mL/min    Comment: (NOTE) The eGFR has been calculated using the CKD EPI equation. This calculation has not been validated in all clinical situations. eGFR's persistently <60 mL/min signify possible Chronic Kidney Disease.    Anion gap 15 5 - 15    Comment: Performed at [facility], [place] [address], [place], [place] [address]  Glucose, capillary     Status: None   Collection Time: 05/02/17  5:42 PM  Result Value  Ref Range   Glucose-Capillary 99 65 - 99 mg/dL  Renal function panel     Status: Abnormal   Collection Time: 05/02/17  6:31 PM  Result Value Ref Range   Sodium 138 135 - 145 mmol/L   Potassium 5.5 (H) 3.5 - 5.1 mmol/L   Chloride 107 101 - 111 mmol/L   CO2 15 (L) 22 - 32 mmol/L   Glucose, Bld 108 (H) 65 - 99 mg/dL   BUN 106 (H) 6 - 20 mg/dL    Comment: RESULTS CONFIRMED BY MANUAL DILUTION   Creatinine, Ser 6.79 (H) 0.61 - 1.24 mg/dL   Calcium 8.0 (L) 8.9 - 10.3 mg/dL   Phosphorus 7.3 (H) 2.5 - 4.6 mg/dL   Albumin 2.0 (L) 3.5 - 5.0 g/dL   GFR calc non Af Amer 9 (L) >60 mL/min   GFR calc Af Amer 10 (L) >60 mL/min    Comment: (NOTE) The eGFR has been calculated using the CKD EPI equation. This calculation has not been validated in all clinical situations. eGFR's persistently <60 mL/min signify possible Chronic Kidney Disease.    Anion gap 16 (H) 5 - 15    Comment: Performed at [facility], [place] [address] [place], [place] [address]  Vancomycin, random     Status: Abnormal   Collection Time: 05/02/17  6:45 PM  Result Value Ref Range   Vancomycin Rm 57 (HH)     Comment:        Random Vancomycin therapeutic range is dependent on dosage and time of specimen  collection. A peak range is 20.0-40.0 ug/mL A trough range is 5.0-15.0 ug/mL        CRITICAL RESULT CALLED TO, READ BACK BY AND VERIFIED WITH: BULLINS, H AT 1936 ON 05/02/2017 BY MOSLEY,J Performed at [facility], [place] [address], [place], [place] [address]   Glucose, capillary     Status: Abnormal   Collection Time: 05/02/17  8:30 PM  Result Value Ref Range   Glucose-Capillary 132 (H) 65 - 99 mg/dL  Renal function panel     Status: Abnormal   Collection Time: 05/03/17 12:22 AM  Result Value Ref Range   Sodium 139 135 - 145 mmol/L   Potassium 5.2 (H) 3.5 - 5.1 mmol/L   Chloride 108 101 - 111 mmol/L   CO2 17 (L) 22 - 32 mmol/L   Glucose, Bld 170 (H) 65 - 99 mg/dL   BUN 109 (H) 6 - 20 mg/dL    Comment: RESULTS CONFIRMED BY MANUAL DILUTION   Creatinine, Ser 6.96 (H) 0.61 - 1.24 mg/dL   Calcium 7.9 (L) 8.9 - 10.3 mg/dL   Phosphorus 7.7 (H) 2.5 - 4.6 mg/dL   Albumin 2.0 (L) 3.5 - 5.0 g/dL   GFR calc non Af Amer 8 (L) >60 mL/min   GFR calc Af Amer 10 (L) >60 mL/min    Comment: (NOTE) The eGFR has been calculated using the CKD EPI equation. This calculation has not been validated in all clinical situations. eGFR's persistently <60 mL/min signify possible Chronic Kidney Disease.    Anion gap 14 5 - 15    Comment: Performed at [facility], [place] [address] [place], [place] [address]  Glucose, capillary     Status: Abnormal   Collection Time: 05/03/17  7:37 AM  Result Value Ref Range   Glucose-Capillary 242 (H) 65 - 99 mg/dL  Renal function panel     Status: Abnormal   Collection Time: 05/03/17  8:04  AM  Result Value Ref Range   Sodium 136 135 - 145 mmol/L   Potassium 4.5 3.5 - 5.1 mmol/L   Chloride 102 101 - 111 mmol/L   CO2 17 (L) 22 - 32 mmol/L   Glucose, Bld 234 (H) 65 - 99 mg/dL   BUN 107 (H) 6 - 20 mg/dL    Comment: RESULTS CONFIRMED BY MANUAL DILUTION   Creatinine, Ser 6.92 (H) 0.61 - 1.24 mg/dL   Calcium 7.6 (L) 8.9 - 10.3 mg/dL    Phosphorus 7.5 (H) 2.5 - 4.6 mg/dL   Albumin 2.0 (L) 3.5 - 5.0 g/dL   GFR calc non Af Amer 8 (L) >60 mL/min   GFR calc Af Amer 10 (L) >60 mL/min    Comment: (NOTE) The eGFR has been calculated using the CKD EPI equation. This calculation has not been validated in all clinical situations. eGFR's persistently <60 mL/min signify possible Chronic Kidney Disease.    Anion gap 17 (H) 5 - 15    Comment: Performed at [facility], [place] [address], [place], [place] [address]  Glucose, capillary     Status: Abnormal   Collection Time: 05/03/17 12:07 PM  Result Value Ref Range   Glucose-Capillary 243 (H) 65 - 99 mg/dL  Renal function panel     Status: Abnormal   Collection Time: 05/03/17  1:31 PM  Result Value Ref Range   Sodium 137 135 - 145 mmol/L   Potassium 4.1 3.5 - 5.1 mmol/L   Chloride 101 101 - 111 mmol/L   CO2 19 (L) 22 - 32 mmol/L   Glucose, Bld 238 (H) 65 - 99 mg/dL   BUN 108 (H) 6 - 20 mg/dL    Comment: RESULTS CONFIRMED BY MANUAL DILUTION   Creatinine, Ser 7.00 (H) 0.61 - 1.24 mg/dL   Calcium 7.7 (L) 8.9 - 10.3 mg/dL   Phosphorus 7.2 (H) 2.5 - 4.6 mg/dL   Albumin 2.0 (L) 3.5 - 5.0 g/dL   GFR calc non Af Amer 8 (L) >60 mL/min   GFR calc Af Amer 10 (L) >60 mL/min    Comment: (NOTE) The eGFR has been calculated using the CKD EPI equation. This calculation has not been validated in all clinical situations. eGFR's persistently <60 mL/min signify possible Chronic Kidney Disease.    Anion gap 17 (H) 5 - 15    Comment: Performed at [facility], [place] [address], [place], [place] [address]  Glucose, capillary     Status: Abnormal   Collection Time: 05/03/17  4:26 PM  Result Value Ref Range   Glucose-Capillary 246 (H) 65 - 99 mg/dL  Renal function panel     Status: Abnormal   Collection Time: 05/03/17  7:30 PM  Result Value Ref Range   Sodium 134 (L) 135 - 145 mmol/L   Potassium 3.7 3.5 - 5.1 mmol/L   Chloride 98 (L) 101 - 111 mmol/L   CO2  20 (L) 22 - 32 mmol/L   Glucose, Bld 259 (H) 65 - 99 mg/dL   BUN 89 (H) 6 - 20 mg/dL    Comment: RESULTS CONFIRMED BY MANUAL DILUTION   Creatinine, Ser 7.08 (H) 0.61 - 1.24 mg/dL   Calcium 7.4 (L) 8.9 - 10.3 mg/dL   Phosphorus 6.9 (H) 2.5 - 4.6 mg/dL   Albumin 2.0 (L) 3.5 - 5.0 g/dL   GFR calc non Af Amer 8 (L) >60 mL/min   GFR calc Af Amer 9 (L) >60 mL/min    Comment: (  NOTE) The eGFR has been calculated using the CKD EPI equation. This calculation has not been validated in all clinical situations. eGFR's persistently <60 mL/min signify possible Chronic Kidney Disease.    Anion gap 16 (H) 5 - 15    Comment: Performed at [facility], [place] [address], [place], [place] [address]  Glucose, capillary     Status: Abnormal   Collection Time: 05/03/17  8:50 PM  Result Value Ref Range   Glucose-Capillary 251 (H) 65 - 99 mg/dL   Comment 1 Notify RN   Creatinine, serum     Status: Abnormal   Collection Time: 05/04/17 12:16 AM  Result Value Ref Range   Creatinine, Ser 7.53 (H) 0.61 - 1.24 mg/dL   GFR calc non Af Amer 8 (L) >60 mL/min   GFR calc Af Amer 9 (L) >60 mL/min    Comment: (NOTE) The eGFR has been calculated using the CKD EPI equation. This calculation has not been validated in all clinical situations. eGFR's persistently <60 mL/min signify possible Chronic Kidney Disease. Performed at [facility], [place] [address] [place], [place] [address]   Renal function panel     Status: Abnormal   Collection Time: 05/04/17 12:16 AM  Result Value Ref Range   Sodium 134 (L) 135 - 145 mmol/L   Potassium 3.7 3.5 - 5.1 mmol/L   Chloride 98 (L) 101 - 111 mmol/L   CO2 18 (L) 22 - 32 mmol/L   Glucose, Bld 245 (H) 65 - 99 mg/dL   BUN 108 (H) 6 - 20 mg/dL    Comment: RESULTS CONFIRMED BY MANUAL DILUTION   Creatinine, Ser 7.32 (H) 0.61 - 1.24 mg/dL   Calcium 7.5 (L) 8.9 - 10.3 mg/dL   Phosphorus 6.9 (H) 2.5 - 4.6 mg/dL   Albumin 1.9 (L) 3.5 - 5.0 g/dL    GFR calc non Af Amer 8 (L) >60 mL/min   GFR calc Af Amer 9 (L) >60 mL/min    Comment: (NOTE) The eGFR has been calculated using the CKD EPI equation. This calculation has not been validated in all clinical situations. eGFR's persistently <60 mL/min signify possible Chronic Kidney Disease.    Anion gap 18 (H) 5 - 15    Comment: Performed at [facility], [place] [address] [place], [place] [address]  Renal function panel     Status: Abnormal   Collection Time: 05/04/17  6:18 AM  Result Value Ref Range   Sodium 134 (L) 135 - 145 mmol/L   Potassium 3.7 3.5 - 5.1 mmol/L   Chloride 96 (L) 101 - 111 mmol/L   CO2 22 22 - 32 mmol/L   Glucose, Bld 248 (H) 65 - 99 mg/dL   BUN 100 (H) 6 - 20 mg/dL    Comment: RESULTS CONFIRMED BY MANUAL DILUTION   Creatinine, Ser 7.45 (H) 0.61 - 1.24 mg/dL   Calcium 7.5 (L) 8.9 - 10.3 mg/dL   Phosphorus 6.6 (H) 2.5 - 4.6 mg/dL   Albumin 1.9 (L) 3.5 - 5.0 g/dL   GFR calc non Af Amer 8 (L) >60 mL/min   GFR calc Af Amer 9 (L) >60 mL/min    Comment: (NOTE) The eGFR has been calculated using the CKD EPI equation. This calculation has not been validated in all clinical situations. eGFR's persistently <60 mL/min signify possible Chronic Kidney Disease.    Anion gap 16 (H) 5 - 15    Comment: Performed at [facility], [place]  [address] [place], [place] [address]  Magnesium     Status: None   Collection Time: 05/04/17  6:18 AM  Result Value Ref Range   Magnesium 1.8 1.7 - 2.4 mg/dL    Comment: Performed at [facility], [place] [address], [place], [place] [address]  Glucose, capillary     Status: Abnormal   Collection Time: 05/04/17  8:12 AM  Result Value Ref Range   Glucose-Capillary 270 (H) 65 - 99 mg/dL    Current Facility-Administered Medications  Medication Dose Route Frequency Provider Last Rate Last Dose  . acetaminophen (TYLENOL) tablet 650 mg  650 mg Oral Q4H PRN Georgette Shell, MD      .  amiodarone (PACERONE) tablet 200 mg  200 mg Oral Daily Georgette Shell, MD   200 mg at 05/04/17 1100  . bisacodyl (DULCOLAX) EC tablet 5 mg  5 mg Oral Daily PRN Georgette Shell, MD      . ceFEPIme (MAXIPIME) 1 g in sodium chloride 0.9 % 100 mL IVPB  1 g Intravenous QPC supper Polly Cobia, [facility]   Stopped at 05/03/17 1854  . DAPTOmycin (CUBICIN) 1,000 mg in sodium chloride 0.9 % IVPB  1,000 mg Intravenous Q48H Polly Cobia, RPH   Stopped at 05/02/17 1355  . dexamethasone (DECADRON) tablet 3 mg  3 mg Oral Q8H Green, Terri L, RPH   3 mg at 05/04/17 0607  . diltiazem (CARDIZEM) tablet 60 mg  60 mg Oral Q8H Donne Hazel, MD      . feeding supplement (GLUCERNA SHAKE) (GLUCERNA SHAKE) liquid 237 mL  237 mL Oral BID BM Donne Hazel, MD   237 mL at 05/03/17 1408  . insulin aspart (novoLOG) injection 0-5 Units  0-5 Units Subcutaneous QHS Donne Hazel, MD   3 Units at 05/03/17 2308  . insulin aspart (novoLOG) injection 0-9 Units  0-9 Units Subcutaneous TID WC Donne Hazel, MD   5 Units at 05/04/17 [phone]  . insulin glargine (LANTUS) injection 10 Units  10 Units Subcutaneous Daily Donne Hazel, MD      . labetalol (NORMODYNE,TRANDATE) injection 5 mg  5 mg Intravenous Q2H PRN Donne Hazel, MD      . LORazepam (ATIVAN) tablet 0.5 mg  0.5 mg Oral Q6H PRN Georgette Shell, MD   0.5 mg at 05/04/17 0835  . methocarbamol (ROBAXIN) tablet 500 mg  500 mg Oral Q6H PRN Georgette Shell, MD      . ondansetron [facility]) injection 4 mg  4 mg Intravenous Q6H PRN Donne Hazel, MD   4 mg at 05/02/17 1111  . ondansetron (ZOFRAN) tablet 4 mg  4 mg Oral Q6H PRN Georgette Shell, MD   4 mg at 05/02/17 0522  . pantoprazole (PROTONIX) EC tablet 40 mg  40 mg Oral Daily Georgette Shell, MD   40 mg at 05/04/17 1100  . sodium bicarbonate 150 mEq in sterile water 1,000 mL infusion   Intravenous Continuous Donne Hazel, MD 125 mL/hr at 05/04/17 1125    . sodium chloride flush (NS) 0.9 %  injection 10-40 mL  10-40 mL Intracatheter PRN Donne Hazel, MD      . traZODone (DESYREL) tablet 50 mg  50 mg Oral QHS PRN Georgette Shell, MD        Musculoskeletal: Strength & Muscle Tone: within normal limits Gait & Station: UTA due to patient lying in bed. Patient leans: N/A  Psychiatric Specialty  Exam: Physical Exam  Nursing note and vitals reviewed. Constitutional: He is oriented to person, place, and time. He appears well-developed and well-nourished.  HENT:  Head: Normocephalic and atraumatic.  Neck: Normal range of motion.  Respiratory: Effort normal.  Musculoskeletal: Normal range of motion.  Neurological: He is alert and oriented to person, place, and time.  Skin: No rash noted.  Psychiatric: His speech is normal and behavior is normal. Judgment and thought content normal. Cognition and memory are normal. He exhibits a depressed mood.    Review of Systems  Constitutional: Negative for chills and fever.  Cardiovascular: Negative for chest pain.  Gastrointestinal: Negative for abdominal pain, diarrhea, nausea and vomiting.  Psychiatric/Behavioral: Positive for depression and hallucinations (associated with sleep.). Negative for substance abuse and suicidal ideas. The patient is nervous/anxious and has insomnia.   All other systems reviewed and are negative.   Blood pressure (!) 144/76, pulse 93, temperature 100 F (37.8 C), temperature source Oral, resp. rate 20, height _0  (1.93 m), weight 120.4 kg (265 lb 6.4 oz), SpO2 99 %.Body mass index is 32.31 kg/m.  General Appearance: Fairly Groomed, overweight, middle aged, African American male, wearing a hospital gown and lying in bed. NAD.   Eye Contact:  Good  Speech:  Clear and Coherent and Normal Rate  Volume:  Normal  Mood:  Depressed  Affect:  Congruent and Tearful  Thought Process:  Goal Directed, Linear and Descriptions of Associations: Intact  Orientation:  Full (Time, Place, and Person)  Thought  Content:  Logical  Suicidal Thoughts:  No  Homicidal Thoughts:  No  Memory:  Immediate;   Good Recent;   Good Remote;   Good  Judgement:  Good  Insight:  Good  Psychomotor Activity:  Normal  Concentration:  Concentration: Good and Attention Span: Good  Recall:  Good  Fund of Knowledge:  Good  Language:  Good  Akathisia:  No  Handed:  Right  AIMS (if indicated):   N/A  Assets:  Communication Skills Desire for Improvement Financial Resources/Insurance Housing Social Support  ADL's:  Intact  Cognition:  WNL  Sleep:   Poor   Assessment:  Dylan [name] is a 52 y.o. male who was admitted with AKI.  He endorses depression in the setting of recent medical condition.  He reports poor appetite, weight loss, poor sleep and anxiety.  He will likely benefit from an antidepressant to treat depressive symptoms.  He denies SI, HI or AVH.  He does not warrant inpatient psychiatric hospitalization at this time.  Treatment Plan Summary: -Recommend starting Remeron 15 mg qhs for sleep, anxiety and depression.  If medication causes problems with weight gain then can switch to a SSRI. -Please have unit SW provide patient with resources for outpatient psychiatrists and therapists. -Patient is psychiatrically cleared. Psychiatry will sign off patient at this time.  Please consult psychiatry again as needed.  Disposition: No evidence of imminent risk to self or others at present.   Patient does not meet criteria for psychiatric inpatient admission.  Faythe Dingwall, DO 05/04/2017 12:15 PM

## 2017-05-04 NOTE — Progress Notes (Signed)
[facility] Progress Note  Subjective: no new c/o  Vitals:   05/03/17 1404 05/03/17 2046 05/04/17 0524 05/04/17 1018  BP: (!) 151/72 140/73 140/62 (!) 144/76  Pulse: 90 89 93   Resp: 16 18 20    Temp: 98.3 F (36.8 C) 99.3 F (37.4 C) 100 F (37.8 C)   TempSrc: Oral Oral Oral   SpO2: 98% 99% 99%   Weight:      Height:        Inpatient medications: . amiodarone  200 mg Oral Daily  . dexamethasone  3 mg Oral Q8H  . diltiazem  60 mg Oral Q8H  . feeding supplement (GLUCERNA SHAKE)  237 mL Oral BID BM  . insulin aspart  0-5 Units Subcutaneous QHS  . insulin aspart  0-9 Units Subcutaneous TID WC  . pantoprazole  40 mg Oral Daily   . ceFEPime (MAXIPIME) IV Stopped (05/03/17 1854)  . DAPTOmycin (CUBICIN)  IV Stopped (05/02/17 1355)  .  sodium bicarbonate (isotonic) infusion in sterile water     acetaminophen, bisacodyl, labetalol, LORazepam, methocarbamol, ondansetron (ZOFRAN) IV, ondansetron, sodium chloride flush, traZODone  Exam: No distress, lying flat No jvd Chest clear bilat RRR  Abd soft obese no ascites Ext no edema LE's or UE's Neuro gen'd weakness x 4 ext  CXR 3/30 - clear UA 04/01/17 > prot 30, tntc rbc/ wbc's, 1.017 UA 04/08/17 > prot neg, 1.010, many bact, tntc rbc/ wbc UA 04/15/2017 > prot 30, 1.015, few bact, 6-30 rbc/ tntc wbc UNa 3/7 > 79 UCr 3/7 > 34.82 CT abd 3/30 > Adrenals/Urinary Tract: Normal adrenal glands. No renal calculi. Minimal bilateral caliectasis is favored to be physiologic in the setting of bladder distension. There is symmetric mild bilateral perirenal edema. Bladder distension, despite Foley catheter in place.   Impression: 1  AKI on prob CKD: obstructed foley but more likely vanc toxicity.  Very high vanc levels still.  Still looks dry w/ room for volume, cont IVF at 125 cc/hr.  Cont supportive care, no signs of uremia. Would like to avoid HD if at all possible, as patient is not likely a long-term HD candidate w/ his  immobility.   2  Multiple sclerosis/ quadriplegia - per primary  3  Hyperkalemia: resolved w/ kayexalate and enemas    Plan - will follow   Vinson Moselle MD [facility] pager [phone]   05/04/2017, 10:29 AM   Recent Labs  Lab 05/03/17 1930 05/04/17 0016 05/04/17 0618  NA 134* 134* 134*  K 3.7 3.7 3.7  CL 98* 98* 96*  CO2 20* 18* 22  GLUCOSE 259* 245* 248*  BUN 89* 108* 100*  CREATININE 7.08* 7.32*  7.53* 7.45*  CALCIUM 7.4* 7.5* 7.5*  PHOS 6.9* 6.9* 6.6*   Recent Labs  Lab 05/02/2017 0946  05/03/17 1930 05/04/17 0016 05/04/17 0618  AST 20  --   --   --   --   ALT 35  --   --   --   --   ALKPHOS 57  --   --   --   --   BILITOT 0.7  --   --   --   --   PROT 6.0*  --   --   --   --   ALBUMIN 2.4*   < > 2.0* 1.9* 1.9*   < > = values in this interval not displayed.   Recent Labs  Lab 04/16/2017 0946 05/02/17 0411  WBC 23.3* 22.8*  NEUTROABS  20.7*  --   HGB 11.8* 11.0*  HCT 33.9* 32.4*  MCV 86.5 86.4  PLT 119* 98*   Iron/TIBC/Ferritin/ %Sat No results found for: IRON, TIBC, FERRITIN, IRONPCTSAT

## 2017-05-04 NOTE — Progress Notes (Signed)
PHARMACY - PHYSICIAN COMMUNICATION CRITICAL VALUE ALERT - BLOOD CULTURE IDENTIFICATION (BCID)  Dylan [name] is an 52 y.o. male who presented to [facility] on 04/08/2017 with a chief complaint of abdominal pain.  Assessment:  52 yr male being treated for possible epidural abscess   Name of physician (or Provider) Contacted: Dr Rhona Leavens  Current antibiotics: Cefepime, Daptomycin  Changes to prescribed antibiotics recommended:  Eraxis 200mg  IV x 1 followed by 100mg  IV q24h Recommendations accepted by provider  Results for orders placed or performed during the hospital encounter of 04/26/2017  Blood Culture ID Panel (Reflexed) (Collected: 04/11/2017  9:25 AM)  Result Value Ref Range   Enterococcus species NOT DETECTED NOT DETECTED   Listeria monocytogenes NOT DETECTED NOT DETECTED   Staphylococcus species NOT DETECTED NOT DETECTED   Staphylococcus aureus NOT DETECTED NOT DETECTED   Streptococcus species NOT DETECTED NOT DETECTED   Streptococcus agalactiae NOT DETECTED NOT DETECTED   Streptococcus pneumoniae NOT DETECTED NOT DETECTED   Streptococcus pyogenes NOT DETECTED NOT DETECTED   Acinetobacter baumannii NOT DETECTED NOT DETECTED   Enterobacteriaceae species NOT DETECTED NOT DETECTED   Enterobacter cloacae complex NOT DETECTED NOT DETECTED   Escherichia coli NOT DETECTED NOT DETECTED   Klebsiella oxytoca NOT DETECTED NOT DETECTED   Klebsiella pneumoniae NOT DETECTED NOT DETECTED   Proteus species NOT DETECTED NOT DETECTED   Serratia marcescens NOT DETECTED NOT DETECTED   Haemophilus influenzae NOT DETECTED NOT DETECTED   Neisseria meningitidis NOT DETECTED NOT DETECTED   Pseudomonas aeruginosa NOT DETECTED NOT DETECTED   Candida albicans NOT DETECTED NOT DETECTED   Candida glabrata DETECTED (A) NOT DETECTED   Candida krusei NOT DETECTED NOT DETECTED   Candida parapsilosis NOT DETECTED NOT DETECTED   Candida tropicalis NOT DETECTED NOT DETECTED    Maryellen Pile,  PharmD 05/04/2017  7:03 PM

## 2017-05-04 NOTE — Progress Notes (Signed)
Inpatient Diabetes Program Recommendations  AACE/ADA: New Consensus Statement on Inpatient Glycemic Control (2015)  Target Ranges:  Prepandial:   less than 140 mg/dL      Peak postprandial:   less than 180 mg/dL (1-2 hours)      Critically ill patients:  140 - 180 mg/dL   Lab Results  Component Value Date   GLUCAP 270 (H) 05/04/2017   HGBA1C 8.4 (H) 03/22/2017    Review of Glycemic Control  Diabetes history: DM2 Outpatient Diabetes medications: Lantus 70 units QHS, Novolog 10 units tidwc + 2-14 units tidwc Current orders for Inpatient glycemic control: Novolog 0-9 units tidwc and hs  Blood sugars in 200s.  On soft diet. HgbA1C - 8.4%  Inpatient Diabetes Program Recommendations:     Add Lantus 15 units QHS Add CHO mod med to soft diet.  May need small amount of meal coverage insulin if eating > 50% meal  Will continue to follow.   Thank you. Ailene Ards, RD, LDN, CDE Inpatient Diabetes Coordinator [phone]

## 2017-05-04 NOTE — Progress Notes (Signed)
NSR, HR 80's.  MD notified via text page.

## 2017-05-04 NOTE — Progress Notes (Signed)
Received call from CMT. Patient had 3 beat run of VTach.  MD notified via text page.

## 2017-05-04 NOTE — Progress Notes (Signed)
PROGRESS NOTE    Dylan [name]  FAO:[id number] DOB: 09-05-1965 DOA: 04/03/2017 PCP: Patient, No Pcp Per    Brief Narrative:  52 y.o. male with medical history significant of recent quadriplegia, MS, decompression of spinal abscess admitted with abdominal pain and hypoglycemia.  He was diagnosed with MS and spinal stenosis .  He was admitted to neurosurgery 04/12/2017.  He had surgery at C6 C[facility] and C7.  Postoperative course was complicated with atrial fibrillation placed on Eliquis followed by which epidural clot versus abscess was seen on his C-spine MRI.  Because of the high risk patient decided not to repeat exploratory surgery.  He was started on IV antibiotics due to complicated UTI possible cervical abscess and was discharged to skilled nursing facility on 04/09/2017 patient was supposed to be on vancomycin and cefepime until May 06, 2017.  But the vancomycin was stopped earlier due to increasing creatinine. Patient was found to have gradual progressive swelling of his neck a CT scan at that time showed prevertebral hematoma.  Patient presented to ED with abd pain, found to have Cr of 5.68 and concerns for non-draining foley cath. After removing foreign body, cath yielded 2.5L urine. Patient also noted to have K in excess of 6.4, given kayexelate. Patient admitted for further work up.  Assessment & Plan:   Principal Problem:   AKI (acute kidney injury) ([place]) Active Problems:   Uncontrolled diabetes mellitus type 2 without complications ([place])   Hypertension   Morbid obesity ([place])   Paroxysmal atrial fibrillation ([facility])   Multiple sclerosis exacerbation ([facility])   Pressure injury of skin   Hyperkalemia   Hematoma   Insomnia   Adjustment reaction with anxiety and depression   Quadriplegia ([facility])   History of fusion of cervical spine   Postoperative infection   Coronary artery disease   Atherosclerotic peripheral vascular disease ([place])  1] acute kidney injury -Markedly worsened renal  function noted over baseline at time of presentation -Concerns for post-obstructive disease vs nephrotoxicity given recent vanc for possible epidural abscess. Vancomycin discontinued -Patient seen by urology with foreign body removed with resultant 2L urine output -Nephrology following. Cr continuing to trend upwards, now 7.45 -Random vanc elevated at 57 -Patient has been continued on bicarb gtt  2] leukocytosis with positive UA -urine culture without growth thus far -Admitting physician discussed with Dr. Megan Salon infectious disease who has seen pt in consultation -Patient continued on cefepime as per below  3] type 2 diabetes  -metformin on hold given renal function -Glucose in the 200's -Pt on lantus 70 units daily prior to admission -Given poor renal function, will continue on decreased dose of 10 units lantus daily -continue SSI coverage  4] Possible epidural abscess -Pt known to neurosurgical service -concerns for possible epidural abscess vs hematoma, noted to be a suboptimal surgical candidate with plans to empirically treat -Pt had been on cefepime and vanc with abx planned through 4/4 -Vanc discontinued given concerns of nephrotoxicity. Patient now on daptomycin per ID -Blood culture neg reviewed, negative thus far  5] Constipation -CT reviewed. Stool and increased bowel gas noted -Multiple doses of kayexelate were given with no effect this AM -Patient has hx of chronic constipation at baseline -Good results with soap suds enema. Denies abd pain at present  6] Hyperkalemia - Resolved - Repeat bmet in AM  7] Afib with RVR - Will increase cardizem to '60mg'$  q8hrs - Will order PRN labetalol for HR>120 with hold parameters - lytes reviewed  8] depression -  Denies suicidal ideation - Patient requesting seeing a psychiatrist. Will consult  DVT prophylaxis: SCD's Code Status: DNR Family Communication: Pt in room, family not at bedside Disposition Plan: Uncertain at  this time  Consultants:   Urology  Nephrology  ID  Procedures:     Antimicrobials: Anti-infectives (From admission, onward)   Start     Dose/Rate Route Frequency Ordered Stop   05/02/17 1200  DAPTOmycin (CUBICIN) 1,000 mg in sodium chloride 0.9 % IVPB     1,000 mg 240 mL/hr over 30 Minutes Intravenous Every 48 hours 05/02/17 1143     04/05/2017 1900  ceFEPIme (MAXIPIME) 1 g in sodium chloride 0.9 % 100 mL IVPB     1 g 200 mL/hr over 30 Minutes Intravenous Daily after supper 04/04/2017 1833     04/23/2017 1800  ceFEPime (MAXIPIME) IVPB  Status:  Discontinued    Note to Pharmacy:  Indication:  Spinal abscess Last Day of Therapy:  05/16/2017 Labs - Once weekly:  CBC/D and BMP, Labs - Every other week:  ESR and CRP     2 g Intravenous Every 12 hours 04/05/2017 1757 04/08/2017 1833   04/18/2017 1245  vancomycin (VANCOCIN) 2,000 mg in sodium chloride 0.9 % 500 mL IVPB     2,000 mg 250 mL/hr over 120 Minutes Intravenous  Once 04/11/2017 1231 04/22/2017 1757   04/08/2017 1230  piperacillin-tazobactam (ZOSYN) IVPB 3.375 g     3.375 g 100 mL/hr over 30 Minutes Intravenous  Once 04/20/2017 1217 05/02/2017 1407   04/23/2017 1230  vancomycin (VANCOCIN) IVPB 1000 mg/200 mL premix  Status:  Discontinued     1,000 mg 200 mL/hr over 60 Minutes Intravenous  Once 04/07/2017 1217 04/03/2017 1231      Subjective: Without complaints at present  Objective: Vitals:   05/03/17 1404 05/03/17 2046 05/04/17 0524 05/04/17 1018  BP: (!) 151/72 140/73 140/62 (!) 144/76  Pulse: 90 89 93   Resp: 16 18 20    Temp: 98.3 F (36.8 C) 99.3 F (37.4 C) 100 F (37.8 C)   TempSrc: Oral Oral Oral   SpO2: 98% 99% 99%   Weight:      Height:        Intake/Output Summary (Last 24 hours) at 05/04/2017 1148 Last data filed at 05/04/2017 0540 Gross per 24 hour  Intake 3440 ml  Output 634 ml  Net 2806 ml   Filed Weights   04/12/2017 0855 04/20/2017 1909 05/03/17 0042  Weight: 111.6 kg (246 lb) 120.4 kg (265 lb 6.4 oz) 120.4 kg (265  lb 6.4 oz)    Examination: General exam: Conversant, in no acute distress Respiratory system: normal chest rise, clear, no audible wheezing Cardiovascular system: regular rhythm, s1-s2 Gastrointestinal system: Nondistended, nontender, pos BS Central nervous system: No seizures, no tremors Extremities: No cyanosis, no joint deformities Skin: No rashes, no pallor Psychiatry: Affect normal // no auditory hallucinations   Data Reviewed: I have personally reviewed following labs and imaging studies  CBC: Recent Labs  Lab 04/23/2017 0946 05/02/17 0411  WBC 23.3* 22.8*  NEUTROABS 20.7*  --   HGB 11.8* 11.0*  HCT 33.9* 32.4*  MCV 86.5 86.4  PLT 119* 98*   Basic Metabolic Panel: Recent Labs  Lab 05/03/17 0804 05/03/17 1331 05/03/17 1930 05/04/17 0016 05/04/17 0618  NA 136 137 134* 134* 134*  K 4.5 4.1 3.7 3.7 3.7  CL 102 101 98* 98* 96*  CO2 17* 19* 20* 18* 22  GLUCOSE 234* 238* 259* 245* 248*  BUN  107* 108* 89* 108* 100*  CREATININE 6.92* 7.00* 7.08* 7.32*  7.53* 7.45*  CALCIUM 7.6* 7.7* 7.4* 7.5* 7.5*  MG  --   --   --   --  1.8  PHOS 7.5* 7.2* 6.9* 6.9* 6.6*   GFR: Estimated Creatinine Clearance: 17 mL/min (A) (by C-G formula based on SCr of 7.45 mg/dL (H)). Liver Function Tests: Recent Labs  Lab 04/15/2017 0946  05/03/17 0804 05/03/17 1331 05/03/17 1930 05/04/17 0016 05/04/17 0618  AST 20  --   --   --   --   --   --   ALT 35  --   --   --   --   --   --   ALKPHOS 57  --   --   --   --   --   --   BILITOT 0.7  --   --   --   --   --   --   PROT 6.0*  --   --   --   --   --   --   ALBUMIN 2.4*   < > 2.0* 2.0* 2.0* 1.9* 1.9*   < > = values in this interval not displayed.   Recent Labs  Lab 04/19/2017 0946  LIPASE 25   No results for input(s): AMMONIA in the last 168 hours. Coagulation Profile: No results for input(s): INR, PROTIME in the last 168 hours. Cardiac Enzymes: Recent Labs  Lab 04/14/2017 0946  CKTOTAL 36*   BNP (last 3 results) No results  for input(s): PROBNP in the last 8760 hours. HbA1C: No results for input(s): HGBA1C in the last 72 hours. CBG: Recent Labs  Lab 05/03/17 0737 05/03/17 1207 05/03/17 1626 05/03/17 2050 05/04/17 0812  GLUCAP 242* 243* 246* 251* 270*   Lipid Profile: No results for input(s): CHOL, HDL, LDLCALC, TRIG, CHOLHDL, LDLDIRECT in the last 72 hours. Thyroid Function Tests: No results for input(s): TSH, T4TOTAL, FREET4, T3FREE, THYROIDAB in the last 72 hours. Anemia Panel: No results for input(s): VITAMINB12, FOLATE, FERRITIN, TIBC, IRON, RETICCTPCT in the last 72 hours. Sepsis Labs: Recent Labs  Lab 04/18/2017 6283 04/21/2017 1152 04/09/2017 1926 04/26/2017 2145  LATICACIDVEN 2.58* 2.58* 7.8* 6.5*    Recent Results (from the past 240 hour(s))  Urine culture     Status: None   Collection Time: 04/20/2017  9:21 AM  Result Value Ref Range Status   Specimen Description   Final    URINE, RANDOM Performed at [facility] [address], [place], [place] [address]    Special Requests   Final    NONE Performed at [facility], [place] [address], [place], [place] [address]    Culture   Final    NO GROWTH Performed at [facility], [place] [address], [place], [place] [address]    Report Status 05/02/2017 FINAL  Final  Blood culture (routine x 2)     Status: None (Preliminary result)   Collection Time: 04/02/2017  9:25 AM  Result Value Ref Range Status   Specimen Description   Final    BLOOD PICC LINE Performed at [facility] [address], [place], [place] [address]    Special Requests   Final    BOTTLES DRAWN AEROBIC AND ANAEROBIC Blood Culture adequate volume Performed at [place] [address], [place], [place] [address]    Culture   Final    NO GROWTH 3 DAYS Performed at  [facility], [place] [address] [place], [place] [address]    Report Status PENDING  Incomplete     Radiology Studies: No  results found.  Scheduled Meds: . amiodarone  200 mg Oral Daily  . dexamethasone  3 mg Oral Q8H  . diltiazem  60 mg Oral Q8H  . feeding supplement (GLUCERNA SHAKE)  237 mL Oral BID BM  . insulin aspart  0-5 Units Subcutaneous QHS  . insulin aspart  0-9 Units Subcutaneous TID WC  . pantoprazole  40 mg Oral Daily   Continuous Infusions: . ceFEPime (MAXIPIME) IV Stopped (05/03/17 1854)  . DAPTOmycin (CUBICIN)  IV Stopped (05/02/17 1355)  .  sodium bicarbonate (isotonic) infusion in sterile water 125 mL/hr at 05/04/17 1125     LOS: 3 days   Marylu Lund, MD [facility] Pager [phone]  If 7PM-7AM, please contact night-coverage [email or web address] Password [facility] 05/04/2017, 11:48 AM

## 2017-05-05 ENCOUNTER — Inpatient Hospital Stay (HOSPITAL_COMMUNITY): Payer: Medicaid Other

## 2017-05-05 DIAGNOSIS — N189 Chronic kidney disease, unspecified: Secondary | ICD-10-CM

## 2017-05-05 DIAGNOSIS — B379 Candidiasis, unspecified: Secondary | ICD-10-CM

## 2017-05-05 DIAGNOSIS — B9689 Other specified bacterial agents as the cause of diseases classified elsewhere: Secondary | ICD-10-CM

## 2017-05-05 DIAGNOSIS — L0211 Cutaneous abscess of neck: Secondary | ICD-10-CM

## 2017-05-05 LAB — BASIC METABOLIC PANEL
Anion gap: 19 — ABNORMAL HIGH (ref 5–15)
BUN: 97 mg/dL — AB (ref 6–20)
CALCIUM: 7.4 mg/dL — AB (ref 8.9–10.3)
CO2: 22 mmol/L (ref 22–32)
CREATININE: 7.83 mg/dL — AB (ref 0.61–1.24)
Chloride: 93 mmol/L — ABNORMAL LOW (ref 101–111)
GFR calc non Af Amer: 7 mL/min — ABNORMAL LOW (ref >60)
GFR, EST AFRICAN AMERICAN: 8 mL/min — AB (ref >60)
Glucose, Bld: 215 mg/dL — ABNORMAL HIGH (ref 65–99)
Potassium: 3.4 mmol/L — ABNORMAL LOW (ref 3.5–5.1)
SODIUM: 134 mmol/L — AB (ref 135–145)

## 2017-05-05 LAB — SODIUM, URINE, RANDOM: Sodium, Ur: 77 mmol/L

## 2017-05-05 LAB — GLUCOSE, CAPILLARY
GLUCOSE-CAPILLARY: 248 mg/dL — AB (ref 65–99)
GLUCOSE-CAPILLARY: 218 mg/dL — AB (ref 65–99)
GLUCOSE-CAPILLARY: 223 mg/dL — AB (ref 65–99)
Glucose-Capillary: 228 mg/dL — ABNORMAL HIGH (ref 65–99)

## 2017-05-05 LAB — CREATININE, URINE, RANDOM: Creatinine, Urine: 38.38 mg/dL

## 2017-05-05 NOTE — Progress Notes (Signed)
PROGRESS NOTE  Dylan [name]:[id number] DOB: 11/05/1965 DOA: 24-May-2017 PCP: Patient, No Pcp Per  Brief Summary:  Recently hospitalized due to progressive weakness, found to have cervical spine stenosis s/p surgery, diagnosed with MS, complicated by hematoma vs spina abscess, he was discharged on vanc, returned to hospital with renal failure, likely will need dialysis, will need to [facility]  Infectious disease following, line holiday for fungemia currently, will need new line in a few days.  Nephrology following Neurology consulted for MS management  Has afib with intermittent rvr, tele bed   HPI/Recap of past 24 hours:  Cr continue to trend up  Assessment/Plan: Principal Problem:   MDD (major depressive disorder), single episode, moderate (HCC) Active Problems:   Uncontrolled diabetes mellitus type 2 without complications (HCC)   Hypertension   Morbid obesity (HCC)   Paroxysmal atrial fibrillation (HCC)   Multiple sclerosis exacerbation (HCC)   Pressure injury of skin   Hyperkalemia   AKI (acute kidney injury) (HCC)   Hematoma   Insomnia   Adjustment reaction with anxiety and depression   Quadriplegia (HCC)   History of fusion of cervical spine   Postoperative infection   Coronary artery disease   Atherosclerotic peripheral vascular disease (HCC)   AK I on CKD 3 -Likely from vanc toxicity -Did have urinary retention which required indwellingFoley exchange -nephrology following recommend transfer to [facility] for possible dialysis  Cervical abscess/ hematoma -Was on Vanco cefepime, currently on dapsone and cefepime -Infectious disease following, last dose of antibiotic April 14.  Candidemia -Started on anidulafungin from 4/3 -PICC line removed on April 3 -TTE  -Appreciate infectious disease input  Insulin-dependent type 2 diabetes Recent A1c 8.4 -Continue adjust insulin  A. fib with RVR -Start telemetry,  -Cardizem dose increased to 60 mg  every 8 hours, continue Lopressor, continue amiodarone -Not a candidate for anticoagulation  MS Recently diagnosed in February 2019 -He has been on steroids Family requested neurology consult for MS management Case discussed with Dr. Amada Jupiter   Depression Psychiatry consulted, input appreciated Per psychiatry "No evidence of imminent risk to self or others at present.   Patient does not meet criteria for psychiatric inpatient admission   Code Status: DNR  Family Communication: patient and family at bedside  Disposition Plan: transfer to [facility]   Consultants:  Nephrology   Infectious disease  neurology  Procedures:  picc line removal  Antibiotics:  As above   Objective: BP 129/69 (BP Location: Left Arm)   Pulse 69   Temp 98.4 F (36.9 C) (Oral)   Resp 17   Ht 6\' 4"  (1.93 m)   Wt 127 kg (280 lb)   SpO2 100%   BMI 34.08 kg/m   Intake/Output Summary (Last 24 hours) at 05/05/2017 1521 Last data filed at 05/05/2017 1302 Gross per 24 hour  Intake 2019.17 ml  Output 351 ml  Net 1668.17 ml   Filed Weights   2017/05/24 1909 05/03/17 0042 05/05/17 0559  Weight: 120.4 kg (265 lb 6.4 oz) 120.4 kg (265 lb 6.4 oz) 127 kg (280 lb)    Exam: Patient is examined daily including today on 05/05/2017, exams remain the same as of yesterday except that has changed    General:  NAD, depressed, flat affect  Cardiovascular: IRRR  Respiratory: diminished at baseis  Abdomen: Soft/ND/NT, positive BS, indwelling foley  Musculoskeletal: No Edema  Neuro: alert, oriented x3, not moving legs or arms.  Data Reviewed: Basic Metabolic Panel: Recent Labs  Lab  05/03/17 0804 05/03/17 1331 05/03/17 1930 05/04/17 0016 05/04/17 0618 05/05/17 0420  NA 136 137 134* 134* 134* 134*  K 4.5 4.1 3.7 3.7 3.7 3.4*  CL 102 101 98* 98* 96* 93*  CO2 17* 19* 20* 18* 22 22  GLUCOSE 234* 238* 259* 245* 248* 215*  BUN 107* 108* 89* 108* 100* 97*  CREATININE 6.92* 7.00* 7.08* 7.32*   7.53* 7.45* 7.83*  CALCIUM 7.6* 7.7* 7.4* 7.5* 7.5* 7.4*  MG  --   --   --   --  1.8  --   PHOS 7.5* 7.2* 6.9* 6.9* 6.6*  --    Liver Function Tests: Recent Labs  Lab 04/23/2017 0946  05/03/17 0804 05/03/17 1331 05/03/17 1930 05/04/17 0016 05/04/17 0618  AST 20  --   --   --   --   --   --   ALT 35  --   --   --   --   --   --   ALKPHOS 57  --   --   --   --   --   --   BILITOT 0.7  --   --   --   --   --   --   PROT 6.0*  --   --   --   --   --   --   ALBUMIN 2.4*   < > 2.0* 2.0* 2.0* 1.9* 1.9*   < > = values in this interval not displayed.   Recent Labs  Lab 04/05/2017 0946  LIPASE 25   No results for input(s): AMMONIA in the last 168 hours. CBC: Recent Labs  Lab 04/30/2017 0946 05/02/17 0411  WBC 23.3* 22.8*  NEUTROABS 20.7*  --   HGB 11.8* 11.0*  HCT 33.9* 32.4*  MCV 86.5 86.4  PLT 119* 98*   Cardiac Enzymes:   Recent Labs  Lab 04/09/2017 0946  CKTOTAL 36*   BNP (last 3 results) No results for input(s): BNP in the last 8760 hours.  ProBNP (last 3 results) No results for input(s): PROBNP in the last 8760 hours.  CBG: Recent Labs  Lab 05/04/17 1220 05/04/17 1742 05/04/17 2209 05/05/17 0736 05/05/17 1209  GLUCAP 239* 276* 255* 228* 248*    Recent Results (from the past 240 hour(s))  Urine culture     Status: None   Collection Time: 04/04/2017  9:21 AM  Result Value Ref Range Status   Specimen Description   Final    URINE, RANDOM Performed at [facility], [address] [place], [place] [address]    Special Requests   Final    NONE Performed at [facility], [address] [place], [place] [address]    Culture   Final    NO GROWTH Performed at [facility], [address], [place], [place] [address]    Report Status 05/02/2017 FINAL  Final  Blood culture (routine x 2)     Status: None (Preliminary result)   Collection Time: 04/07/2017  9:25 AM  Result Value Ref Range Status   Specimen Description    Final    BLOOD PICC LINE Performed at [facility], [address], [place], [place] [address]    Special Requests   Final    BOTTLES DRAWN AEROBIC AND ANAEROBIC Blood Culture adequate volume Performed at [facility], [address], [place], [place] [address]    Culture  Setup Time   Final  YEAST ANAEROBIC BOTTLE ONLY CRITICAL RESULT CALLED TO, READ BACK BY AND VERIFIED WITH: Earlean Shawl [facility] 2956 05/04/17 A BROWNING    Culture   Final    TOO [place] TO READ Performed at [facility], [address] [place], [place] [address]    Report Status PENDING  Incomplete  Blood Culture ID Panel (Reflexed)     Status: Abnormal   Collection Time: 04/11/2017  9:25 AM  Result Value Ref Range Status   Enterococcus species NOT DETECTED NOT DETECTED Final   Listeria monocytogenes NOT DETECTED NOT DETECTED Final   Staphylococcus species NOT DETECTED NOT DETECTED Final   Staphylococcus aureus NOT DETECTED NOT DETECTED Final   Streptococcus species NOT DETECTED NOT DETECTED Final   Streptococcus agalactiae NOT DETECTED NOT DETECTED Final   Streptococcus pneumoniae NOT DETECTED NOT DETECTED Final   Streptococcus pyogenes NOT DETECTED NOT DETECTED Final   Acinetobacter baumannii NOT DETECTED NOT DETECTED Final   Enterobacteriaceae species NOT DETECTED NOT DETECTED Final   Enterobacter cloacae complex NOT DETECTED NOT DETECTED Final   Escherichia coli NOT DETECTED NOT DETECTED Final   Klebsiella oxytoca NOT DETECTED NOT DETECTED Final   Klebsiella pneumoniae NOT DETECTED NOT DETECTED Final   Proteus species NOT DETECTED NOT DETECTED Final   Serratia marcescens NOT DETECTED NOT DETECTED Final   Haemophilus influenzae NOT DETECTED NOT DETECTED Final   Neisseria meningitidis NOT DETECTED NOT DETECTED Final   Pseudomonas aeruginosa NOT DETECTED NOT DETECTED Final   Candida albicans NOT DETECTED NOT DETECTED Final   Candida glabrata DETECTED (A) NOT DETECTED  Final    Comment: CRITICAL RESULT CALLED TO, READ BACK BY AND VERIFIED WITH: J GADHIA PHARMD 1829 05/04/17 A BROWNING    Candida krusei NOT DETECTED NOT DETECTED Final   Candida parapsilosis NOT DETECTED NOT DETECTED Final   Candida tropicalis NOT DETECTED NOT DETECTED Final    Comment: Performed at [facility], [address], [place], [place] [address]     Studies: US Renal  Result Date: 05/05/2017 CLINICAL DATA:  52 year old male with acute renal injury. EXAM: RENAL / URINARY TRACT ULTRASOUND COMPLETE COMPARISON:  CT Abdomen and Pelvis 04/08/2017 FINDINGS: Right Kidney: Length: 12.6 centimeters. No mass or hydronephrosis visualized. Increased renal cortical echogenicity (image 2). Left Kidney: Length: 13.5 centimeters. No mass or hydronephrosis visualized. Increased left renal cortical echogenicity similar to the right kidney. Bladder: Decompressed, with a Foley catheter balloon visible. IMPRESSION: Bilateral increased renal cortical echogenicity compatible with chronic medical renal disease. Otherwise negative ultrasound appearance of both kidneys. Electronically Signed   By: Odessa Fleming M.D.   On: 05/05/2017 12:02    Scheduled Meds: . amiodarone  200 mg Oral Daily  . dexamethasone  3 mg Oral Q8H  . diltiazem  60 mg Oral Q8H  . feeding supplement (GLUCERNA SHAKE)  237 mL Oral BID BM  . insulin aspart  0-5 Units Subcutaneous QHS  . insulin aspart  0-9 Units Subcutaneous TID WC  . insulin glargine  10 Units Subcutaneous Daily  . metoprolol tartrate  12.5 mg Oral BID  . mirtazapine  15 mg Oral QHS  . pantoprazole  40 mg Oral Daily    Continuous Infusions: . anidulafungin    . ceFEPime (MAXIPIME) IV 1 g (05/04/17 1750)  . DAPTOmycin (CUBICIN)  IV Stopped (05/04/17 1340)     Time spent: 35 mins, case discussed with infectious disease, neurology and nephrology. More than 50% time spent on coordination of care I have personally reviewed  and interpreted on  05/05/2017 daily labs, tele  strips, imagings as discussed above under date review session and assessment and plans.  I reviewed all nursing notes, pharmacy notes, consultant notes,  vitals, pertinent old records  I have discussed plan of care as described above with RN , patient and family on 05/05/2017   Albertine Grates MD, PhD  [facility] Pager [phone]. If 7PM-7AM, please contact night-coverage at [email or web address], password [facility] 05/05/2017, 3:21 PM  LOS: 4 days

## 2017-05-05 NOTE — Progress Notes (Addendum)
[facility] Progress Note  Subjective: no new c/o, UOP worse daily, 350 cc out yesterday.  Renal US this am showed nondistended bladder.   Vitals:   05/04/17 1018 05/04/17 1437 05/04/17 2203 05/05/17 0559  BP: (!) 144/76 (!) 140/94 123/74 136/69  Pulse:  (!) 109 100 77  Resp:  18 16 18   Temp:  98.2 F (36.8 C) 98.2 F (36.8 C) 98.9 F (37.2 C)  TempSrc:  Oral Oral Oral  SpO2:  100% 100% 95%  Weight:    127 kg (280 lb)  Height:        Inpatient medications: . amiodarone  200 mg Oral Daily  . dexamethasone  3 mg Oral Q8H  . diltiazem  60 mg Oral Q8H  . feeding supplement (GLUCERNA SHAKE)  237 mL Oral BID BM  . insulin aspart  0-5 Units Subcutaneous QHS  . insulin aspart  0-9 Units Subcutaneous TID WC  . insulin glargine  10 Units Subcutaneous Daily  . metoprolol tartrate  12.5 mg Oral BID  . mirtazapine  15 mg Oral QHS  . pantoprazole  40 mg Oral Daily   . anidulafungin    . ceFEPime (MAXIPIME) IV 1 g (05/04/17 1750)  . DAPTOmycin (CUBICIN)  IV Stopped (05/04/17 1340)   acetaminophen, bisacodyl, labetalol, LORazepam, methocarbamol, ondansetron (ZOFRAN) IV, ondansetron, sodium chloride flush, traZODone  Exam: No distress, lying flat No jvd Chest clear bilat RRR  Abd soft obese no ascites Ext no edema LE's or UE's Neuro gen'd weakness x 4 ext  CXR 3/30 - clear UA 04/01/17 > prot 30, tntc rbc/ wbc's, 1.017 UA 04/08/17 > prot neg, 1.010, many bact, tntc rbc/ wbc UA 04/11/2017 > prot 30, 1.015, few bact, 6-30 rbc/ tntc wbc UNa 4/3 > 74 UCr 4/3 > 38 CT abd 3/30 > Adrenals/Urinary Tract: Normal adrenal glands. No renal calculi. Minimal bilateral caliectasis is favored to be physiologic in the setting of bladder distension. There is symmetric mild bilateral perirenal edema. Bladder distension, despite Foley catheter in place Renal US 4/3 > 12- 13 cm kidneys, no hydro, bilat ^echo, bladder decompressed w foley cath visible    Impression: 1  AKI on possible CKD:  suspected ATN d/t vanc toxicity.  Is vol expanded now but not helping renal function.  UOP continues to decline.  Not grossly uremic but suspect he will need RRT soon.  Pt wants everything done in the short term.  Will follow closely.  IVF's dc'd appropriately , is now vol expanded.   2   Cervical abscess / hematoma w/ myelopathy and quadriplegia - per primary and ID; IV abx through mid April (vanc dc'd, on dapto/ cefepime now).   3   Mult sclerosis - recent dx  4   Candidemia - PICC line dc'd, started on antifungals  5   DNR   Plan - will follow   Vinson Moselle MD [facility] pager [phone]   05/05/2017, 3:00 PM   Recent Labs  Lab 05/03/17 1930 05/04/17 0016 05/04/17 0618 05/05/17 0420  NA 134* 134* 134* 134*  K 3.7 3.7 3.7 3.4*  CL 98* 98* 96* 93*  CO2 20* 18* 22 22  GLUCOSE 259* 245* 248* 215*  BUN 89* 108* 100* 97*  CREATININE 7.08* 7.32*  7.53* 7.45* 7.83*  CALCIUM 7.4* 7.5* 7.5* 7.4*  PHOS 6.9* 6.9* 6.6*  --    Recent Labs  Lab 04/29/2017 0946  05/03/17 1930 05/04/17 0016 05/04/17 0618  AST 20  --   --   --   --  ALT 35  --   --   --   --   ALKPHOS 57  --   --   --   --   BILITOT 0.7  --   --   --   --   PROT 6.0*  --   --   --   --   ALBUMIN 2.4*   < > 2.0* 1.9* 1.9*   < > = values in this interval not displayed.   Recent Labs  Lab May 27, 2017 0946 05/02/17 0411  WBC 23.3* 22.8*  NEUTROABS 20.7*  --   HGB 11.8* 11.0*  HCT 33.9* 32.4*  MCV 86.5 86.4  PLT 119* 98*   Iron/TIBC/Ferritin/ %Sat No results found for: IRON, TIBC, FERRITIN, IRONPCTSAT

## 2017-05-05 NOTE — Progress Notes (Signed)
Pharmacy Antibiotic Note  Dylan [name] is a 52 y.o. male admitted on 05-05-17 with spinal abscess. Patient began treatment with vancomycin and cefepime on 3/8 for same, (intended to continue through 4/14) but found to have new AKI.  Pharmacy consulted for Cefepime/daptomycin dosing.  Today, 05/05/2017:  AKI on prob CKD 2nd obstructed foley and vancomycin toxicity. Continues with SCr up to 7.83, Vancomycin random 57 on 3/31  WBC elevated 3/31 (on chronic decadron but WBC lower PTA) ; Afebrile  BCID + for candida glabrata and eraxis added 4/2 PM  Baseline CK wnl, checking q Saturday  Plan:  anidulafungin 200 mg x 1 4/2 then 100 mg IV q24   Continue Cefepime 1 g IV q24 hr  Daptomycin 1000 mg IV q48 hr (8.3 mg/kg TBW)  SCr q48 hr while in AKI; weekly CK while on Dapto  Per ID, continuue dapto/cefepime  through 4/14 as originally planned  Will place OPAT orders once patient stable for discharge   Height: 6\' 4"  (193 cm) Weight: 280 lb (127 kg) IBW/kg (Calculated) : 86.8  Temp (24hrs), Avg:98.4 F (36.9 C), Min:98.2 F (36.8 C), Max:98.9 F (37.2 C)  Recent Labs  Lab 05-May-2017 0946 May 05, 2017 0953 05-05-2017 1152 2017-05-05 1926 2017-05-05 2145 05/02/17 0411  05/02/17 1845  05/03/17 1331 05/03/17 1930 05/04/17 0016 05/04/17 0618 05/05/17 0420  WBC 23.3*  --   --   --   --  22.8*  --   --   --   --   --   --   --   --   CREATININE 5.68*  --   --  5.99*  --  6.17*   < >  --    < > 7.00* 7.08* 7.32*  7.53* 7.45* 7.83*  LATICACIDVEN  --  2.58* 2.58* 7.8* 6.5*  --   --   --   --   --   --   --   --   --   VANCORANDOM  --   --   --   --   --   --   --  20*  --   --   --   --   --   --    < > = values in this interval not displayed.    Estimated Creatinine Clearance: 16.6 mL/min (A) (by C-G formula based on SCr of 7.83 mg/dL (H)).    No Known Allergies Antimicrobials this admission: 3/8 Cefepime >>  3/8 Vanc >> 3/29 3/31Dapto >>  4/2 anidulafungin >>  Dose adjustments  this admission: 3/31 VR 57  Microbiology results: 3/6 BCx: NGF 3/30 UCx: NGF 3/30 BCx PICC line: yeast- anaerobic bottle only; BCID candida glabrata  Thank you for allowing pharmacy to be a part of this patient's care.  Herby Abraham, Pharm.D. [phone] 05/05/2017 8:25 AM

## 2017-05-05 NOTE — Progress Notes (Signed)
Called [facility]link and gave report

## 2017-05-05 NOTE — Progress Notes (Signed)
[facility]   Reason for visit: Follow up on Candidemia  Interval History: seen previously by my partner for concern for vancomycin induced toxicity, now alert with 1/1 positive blood cultures with C glabrata.  Has been started on anidulafungin. No associated fever, chills.   Physical Exam: Constitutional:  Vitals:   05/04/17 2203 05/05/17 0559  BP: 123/74 136/69  Pulse: 100 77  Resp: 16 18  Temp: 98.2 F (36.8 C) 98.9 F (37.2 C)  SpO2: 100% 95%   patient appears in NAD Eyes: anicteric Respiratory: Normal respiratory effort; CTA B Cardiovascular: RRR GI: soft, nt, nd  Review of Systems: Constitutional: negative for fevers and chills Gastrointestinal: negative for diarrhea Integument/breast: negative for rash  Lab Results  Component Value Date   WBC 22.8 (H) 05/02/2017   HGB 11.0 (L) 05/02/2017   HCT 32.4 (L) 05/02/2017   MCV 86.4 05/02/2017   PLT 98 (L) 05/02/2017    Lab Results  Component Value Date   CREATININE 7.83 (H) 05/05/2017   BUN 97 (H) 05/05/2017   NA 134 (L) 05/05/2017   K 3.4 (L) 05/05/2017   CL 93 (L) 05/05/2017   CO2 22 05/05/2017    Lab Results  Component Value Date   ALT 35 04/05/2017   AST 20 04/14/2017   ALKPHOS 57 04/20/2017     Microbiology: Recent Results (from the past 240 hour(s))  Urine culture     Status: None   Collection Time: 05/02/2017  9:21 AM  Result Value Ref Range Status   Specimen Description   Final    URINE, RANDOM Performed at [facility], [address] [place], [place] [address]    Special Requests   Final    NONE Performed at [facility], [address], [place], [place] [address]    Culture   Final    NO GROWTH Performed at [facility], [address] [place], [place] [address]    Report Status 05/02/2017 FINAL  Final  Blood culture (routine x 2)     Status: None (Preliminary result)   Collection Time: 04/21/2017  9:25 AM    Result Value Ref Range Status   Specimen Description   Final    BLOOD PICC LINE Performed at [facility], [address], [place], [place] [address]    Special Requests   Final    BOTTLES DRAWN AEROBIC AND ANAEROBIC Blood Culture adequate volume Performed at [facility], [address] [place], [place] [address]    Culture  Setup Time   Final    YEAST ANAEROBIC BOTTLE ONLY CRITICAL RESULT CALLED TO, READ BACK BY AND VERIFIED WITH: Earlean Shawl [facility] 1308 05/04/17 A BROWNING    Culture   Final    TOO [place] TO READ Performed at [facility], [address], [place], [place] [address]    Report Status PENDING  Incomplete  Blood Culture ID Panel (Reflexed)     Status: Abnormal   Collection Time: 04/21/2017  9:25 AM  Result Value Ref Range Status   Enterococcus species NOT DETECTED NOT DETECTED Final   Listeria monocytogenes NOT DETECTED NOT DETECTED Final   Staphylococcus species NOT DETECTED NOT DETECTED Final   Staphylococcus aureus NOT DETECTED NOT DETECTED Final   Streptococcus species NOT DETECTED NOT DETECTED Final   Streptococcus agalactiae NOT DETECTED NOT DETECTED Final   Streptococcus pneumoniae NOT DETECTED NOT DETECTED Final   Streptococcus  pyogenes NOT DETECTED NOT DETECTED Final   Acinetobacter baumannii NOT DETECTED NOT DETECTED Final   Enterobacteriaceae species NOT DETECTED NOT DETECTED Final   Enterobacter cloacae complex NOT DETECTED NOT DETECTED Final   Escherichia coli NOT DETECTED NOT DETECTED Final   Klebsiella oxytoca NOT DETECTED NOT DETECTED Final   Klebsiella pneumoniae NOT DETECTED NOT DETECTED Final   Proteus species NOT DETECTED NOT DETECTED Final   Serratia marcescens NOT DETECTED NOT DETECTED Final   Haemophilus influenzae NOT DETECTED NOT DETECTED Final   Neisseria meningitidis NOT DETECTED NOT DETECTED Final   Pseudomonas aeruginosa NOT DETECTED NOT DETECTED Final   Candida albicans NOT DETECTED NOT  DETECTED Final   Candida glabrata DETECTED (A) NOT DETECTED Final    Comment: CRITICAL RESULT CALLED TO, READ BACK BY AND VERIFIED WITH: J GADHIA PHARMD 1829 05/04/17 A BROWNING    Candida krusei NOT DETECTED NOT DETECTED Final   Candida parapsilosis NOT DETECTED NOT DETECTED Final   Candida tropicalis NOT DETECTED NOT DETECTED Final    Comment: Performed at [facility], [address] [place], [place] [address]    Impression/Plan:  1. Candidemia - can be resistant to fluconazole so will continue anidulafungin. Will need picc line removal and line holiday Repeat blood cultures to assure clearance TTE  2.  Abscess/hematoma cervical - scan show improvement on empiric vancomycin/daptomy + cefepime.  Duration is through April 14th and needs to continue that.    3.  AKI - renal function has remained elevated and suspect vancomycin toxicity on top of chronic kidney disease.  Now on daptomycin.

## 2017-05-05 NOTE — Consult Note (Signed)
Neurology Consultation  Reason for Consult: MS management-family request Referring Physician: Dr. Erlinda Hong  CC: MS management  History is obtained from: Patient, chart  HPI: Dylan [name] is a 52 y.o. 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 Briefly, patient presented to [facility] initially in February 2019 with bilateral arm and leg weakness.  Imaging revealed multiple demyelinating lesions in the brain, cervical and thoracic cord with clinical and imaging findings concerning for cervical myelopathy. He underwent 5 days of IV steroids for a presumed MS exacerbation as well as was evaluated by neurosurgery and underwent cervical ACDF followed by operative site hematoma after he was started on anticoagulation for new onset atrial fibrillation with resulting quadriparesis. He is currently being treated for a possible abscess in the same area with IV antibiotics and antifungal treatment.  He developed a KI on CKD probably secondary to obstructive uropathy versus vancomycin toxicity. He was transferred to [facility] from [facility] for possible dialysis and further management.  The family requested neurology to see him for opinion on MS management. He says that he has over the past few days been improving some.  He is able to move his arm some but is weak on his shoulders and hand grip.  He also reported complete inability to move his hips or knees and has been unable to walk.  He has preserved sensation in his arm and legs.  Does not report any dysphagia or dysarthria.  Denies any headaches or speech problems.   ROS:  ROS was  performed and is negative except as noted in the HPI.   Past Medical History:  Diagnosis Date  . 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 Diabetes mellitus without complication ([place])   . Hypertension   . MS (congenital mitral stenosis) 2019  . Persistent atrial fibrillation with rapid ventricular response ([place]) 03/27/2017    Family History  Problem Relation Age of Onset  . Prostate cancer Father    Social History:   reports that he has never smoked. He has never used smokeless tobacco. He reports that he does not drink alcohol or use drugs.  Medications  Current Facility-Administered Medications:  .  acetaminophen (TYLENOL) tablet 650 mg, 650 mg, Oral, Q4H PRN, Georgette Shell, MD .  amiodarone (PACERONE) tablet 200 mg, 200 mg, Oral, Daily, Georgette Shell, MD, 200 mg at 05/05/17 0941 .  anidulafungin (ERAXIS) 100 mg in sodium chloride 0.9 % 100 mL IVPB, 100 mg, Intravenous, Q24H, Donne Hazel, MD .  bisacodyl (DULCOLAX) EC tablet 5 mg, 5 mg, Oral, Daily PRN, Georgette Shell, MD .  ceFEPIme (MAXIPIME) 1 g in sodium chloride 0.9 % 100 mL IVPB, 1 g, Intravenous, QPC supper, Polly Cobia, RPH, Stopped at 05/05/17 1738 .  DAPTOmycin (CUBICIN) 1,000 mg in sodium chloride 0.9 % IVPB, 1,000 mg, Intravenous, Q48H, Polly Cobia, RPH, Stopped at 05/04/17 1340 .  dexamethasone (DECADRON) tablet 3 mg, 3 mg, Oral, Q8H, Green, Terri L, RPH, 3 mg at 05/05/17 1450 .  diltiazem (CARDIZEM) tablet 60 mg, 60 mg, Oral, Q8H, Donne Hazel, MD, 60 mg at 05/05/17 1449 .  feeding supplement (GLUCERNA SHAKE) (GLUCERNA SHAKE) liquid 237  mL, 237 mL, Oral, BID BM, Donne Hazel, MD, 237 mL at 05/05/17 1000 .  insulin aspart (novoLOG) injection 0-5 Units, 0-5 Units, Subcutaneous, QHS, Donne Hazel, MD, 3 Units at 05/04/17 2229 .  insulin aspart (novoLOG) injection 0-9 Units, 0-9 Units, Subcutaneous, TID WC, Donne Hazel, MD, 3 Units at 05/05/17 1707 .  insulin glargine (LANTUS) injection 10  Units, 10 Units, Subcutaneous, Daily, Donne Hazel, MD, 10 Units at 05/05/17 1100 .  labetalol (NORMODYNE,TRANDATE) injection 5 mg, 5 mg, Intravenous, Q2H PRN, Donne Hazel, MD .  LORazepam (ATIVAN) tablet 0.5 mg, 0.5 mg, Oral, Q6H PRN, Georgette Shell, MD, 0.5 mg at 05/04/17 0835 .  methocarbamol (ROBAXIN) tablet 500 mg, 500 mg, Oral, Q6H PRN, Georgette Shell, MD .  metoprolol tartrate (LOPRESSOR) tablet 12.5 mg, 12.5 mg, Oral, BID, Donne Hazel, MD, 12.5 mg at 05/05/17 0941 .  mirtazapine (REMERON) tablet 15 mg, 15 mg, Oral, QHS, Faythe Dingwall, DO, 15 mg at 05/04/17 2230 .  ondansetron (ZOFRAN) injection 4 mg, 4 mg, Intravenous, Q6H PRN, Donne Hazel, MD, 4 mg at 05/02/17 1111 .  ondansetron (ZOFRAN) tablet 4 mg, 4 mg, Oral, Q6H PRN, Georgette Shell, MD, 4 mg at 05/02/17 0522 .  pantoprazole (PROTONIX) EC tablet 40 mg, 40 mg, Oral, Daily, Georgette Shell, MD, 40 mg at 05/05/17 0941 .  sodium chloride flush (NS) 0.9 % injection 10-40 mL, 10-40 mL, Intracatheter, PRN, Donne Hazel, MD .  traZODone (DESYREL) tablet 50 mg, 50 mg, Oral, QHS PRN, Georgette Shell, MD, 50 mg at 05/04/17 2230   Exam: Current vital signs: BP (!) 144/77 (BP Location: Left Arm)   Pulse 70   Temp 98.7 F (37.1 C) (Oral)   Resp 17   Ht _0  (1.93 m)   Wt 113.4 kg (250 lb)   SpO2 99%   BMI 30.43 kg/m  Vital signs in last 24 hours: Temp:  [98.2 F (36.8 C)-98.9 F (37.2 C)] 98.7 F (37.1 C) (04/03 2113) Pulse Rate:  [69-100] 70 (04/03 2113) Resp:  [16-18] 17 (04/03 2113) BP: (123-144)/(69-77) 144/77 (04/03 2113) SpO2:  [95 %-100 %] 99 % (04/03 2113) Weight:  [113.4 kg (250 lb)-127 kg (280 lb)] 113.4 kg (250 lb) (04/03 2112) General: Patient is awake alert in no apparent distress HEENT: Normocephalic, atraumatic, dry mucous membranes Lungs: Distant breath sounds with no rales Abdomen: Soft, obese, nontender Cardiovascular: S1-S2 heard, regular rate  rhythm Extremities: Trace edema in all fours Neurological exam  Patient is awake alert oriented x3 His speech is clear. Naming, comprehension, repetition intact. Cranial nerves: Pupils equal round reactive to light, extra ocular movements intact, visual fields full, face symmetric upper facial sensation intact, auditory acuity intact, shoulder shrug intact, palate elevates symmetrically, tongue midline. Motor exam: Bilaterally on the upper extremities, 1/5 on the shoulders, barely 3/5 on the biceps, 2/5 on the triceps, 1/5 hand grips bilaterally. 0/5 hip flexors bilaterally.  0/5 knee flexors and extensors bilaterally. 1/5 plantar and dorsiflexors. Sensory exam: Intact to light touch all over with no extinction. DTRs: Unable to elicit in the upper or lower extremities but has clonus for 06-09-08 beats in both ankles.  No Hoffmann's. Unable to perform finger nose or heel-knee-shin. Unable to test gait due to above  Labs I have reviewed labs in epic and the results pertinent to this consultation are:  CBC    Component Value Date/Time   WBC 22.8 (H) 05/02/2017 0411  RBC 3.75 (L) 05/02/2017 0411   HGB 11.0 (L) 05/02/2017 0411   HCT 32.4 (L) 05/02/2017 0411   PLT 98 (L) 05/02/2017 0411   MCV 86.4 05/02/2017 0411   MCH 29.3 05/02/2017 0411   MCHC 34.0 05/02/2017 0411   RDW 14.5 05/02/2017 0411   LYMPHSABS 1.9 04/17/2017 0946   MONOABS 0.7 04/23/2017 0946   EOSABS 0.0 04/11/2017 0946   BASOSABS 0.0 04/16/2017 0946    CMP     Component Value Date/Time   NA 134 (L) 05/05/2017 0420   NA 133 (A) 04/30/2017   K 3.4 (L) 05/05/2017 0420   CL 93 (L) 05/05/2017 0420   CO2 22 05/05/2017 0420   GLUCOSE 215 (H) 05/05/2017 0420   BUN 97 (H) 05/05/2017 0420   BUN 83 (A) 04/30/2017   CREATININE 7.83 (H) 05/05/2017 0420   CALCIUM 7.4 (L) 05/05/2017 0420   PROT 6.0 (L) 04/15/2017 0946   ALBUMIN 1.9 (L) 05/04/2017 0618   ALBUMIN 4.1 03/22/2017 1618   AST 20 04/26/2017 0946   ALT 35  04/09/2017 0946   ALKPHOS 57 04/08/2017 0946   BILITOT 0.7 04/18/2017 0946   GFRNONAA 7 (L) 05/05/2017 0420   GFRAA 8 (L) 05/05/2017 0420    Lipid Panel  No results found for: CHOL, TRIG, HDL, CHOLHDL, VLDL, LDLCALC, LDLDIRECT   Imaging I have reviewed the images obtained: Most recent MRI C-spine 04/01/2017 showed spinal cord compression and suspected cord edema from C3-C6 with material in the ventral epidural space favored to be a hematoma per the radiology read.  Formal MRI reports below. MRI examination of the brain 03/23/17 IMPRESSION: 1. At least 5 infratentorial and greater than 10 supratentorial lesions consistent with chronic demyelination, with acute to subacute enhancing lesions bilateral occipital lobes. Subcentimeter LEFT thalamus and RIGHT basal ganglia demyelinating plaques. 2. Probable LEFT optic neuritis. 3. Mild parenchymal brain volume loss for age.  MRI of the cervical spine 03/21/2017 an MRI of the thoracic spine 03/21/2017 IMPRESSION: 1. Scattered T2 hyperintense lesions throughout the cervical and thoracic spinal cord as well as in the pons, medulla, and cerebellum. A lesion at C6-7 is enhancing. While some lesions are associated with spinal stenosis and could in isolation reflect spondylotic myelopathy, the presence of lesions elsewhere in the cord and posterior fossa are indicative of a more widespread process such as demyelinating disease. Infection, inflammatory conditions such as sarcoidosis, and metastatic disease are additional considerations. Brain MRI and CSF analysis are recommended. 2. 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 3. Mild spinal stenosis at T6-7 and T7-8 due to disc and facet degeneration. 4. Diffusely abnormal bone marrow signal. Considerations include multiple myeloma, metastatic disease, and other infiltrative/myelofibrotic marrow processes.   MRI of the C-spine  04/01/2017 IMPRESSION: 1. New spinal cord compression and suspected cord edema from the C3 level through the C6 level appears related to material in the ventral epidural space which is favored to be hematoma (see #2). New holo-cord edema from the C3 to the C6 cord levels, superimposed on several preexisting demyelinating spinal cord lesions. 2. Large prevertebral fluid collection tracking from C4 into the upper thoracic spine, and dissecting through the right lateral neck likely along the surgical approach. In the upper thorax the collection continues and tracks into the right lateral paraspinal space. The fluid is dark on all sequences with mild rim enhancement, and favored to be a large hematoma with estimated volume exceeding 100 mL. 3. Associated regional  mass effect on the neck and thoracic inlet soft tissues. No significant airway narrowing is demonstrated. 4. Postoperative changes C5 through C7 including partial C6 corpectomy.  Assessment:  52 year old man with a recent diagnosis of MS made in February 2019, along with cervical spinal stenosis seen at the same time status post decompression complicated by operative site hematoma/abscess because of being on Eliquis for new found atrial fibrillation, treated with antibiotics for the presumed epidural abscess subsequently developing acute kidney injury on chronic kidney disease at the point where hemodialysis is being considered now admitted for further management and most [facility]. Neurological consultation obtained for management of multiple sclerosis on the request of the family. Patient is quadriparetic at this point and is getting management for his multiple medical issues. From a multiple sclerosis standpoint, he had received 5 days of IV steroids for the enhancing lesion seen on his brain MRI. At this point, given the fact that he is being actively treated with antibiotics and antifungal agents for the presumed epidural  abscess, I would not start any disease modifying treatment for multiple sclerosis as it will decrease his immunity and make him more susceptible for infections or delay clearing of the current infections.   Impression: H/o MS diagnosed 03/2017 Cervical myelopathy s/p surgery with ensuing complications of cervical abscess currently on treatment with abx  Recommendations: Medical management per primary team as you are. At this time, I do not think there is any clincal evidence of MS flare up. With concurrent infection being treated, I do not think initiating immunomodulatory therapy for MS would be wise. We should wait for infections to resolve, and then consider MS treatment. As said above, due to low concern for an active MS flareup, I do not see a emergent need for brain imaging. He should follow-up outpatient with Dr. Felecia Shelling on discharge and further imaging and disease modifying treatment for MS should be discussed and initiated at that point.   -- Amie Portland, MD [facility] Pager: [phone] If 7pm to 7am, please call on call as listed on AMION.

## 2017-05-05 NOTE — Progress Notes (Signed)
Report called to RN [place] 3w

## 2017-05-05 NOTE — Progress Notes (Signed)
Per pt request, called tracy and darlene and updated them on pt transfer to [facility]

## 2017-05-06 ENCOUNTER — Other Ambulatory Visit (HOSPITAL_COMMUNITY): Payer: Medicaid Other

## 2017-05-06 ENCOUNTER — Inpatient Hospital Stay (HOSPITAL_COMMUNITY): Payer: Medicaid Other

## 2017-05-06 DIAGNOSIS — B49 Unspecified mycosis: Secondary | ICD-10-CM

## 2017-05-06 DIAGNOSIS — B3789 Other sites of candidiasis: Secondary | ICD-10-CM

## 2017-05-06 DIAGNOSIS — I4892 Unspecified atrial flutter: Secondary | ICD-10-CM

## 2017-05-06 LAB — BASIC METABOLIC PANEL
Anion gap: 20 — ABNORMAL HIGH (ref 5–15)
BUN: 125 mg/dL — AB (ref 6–20)
CHLORIDE: 92 mmol/L — AB (ref 101–111)
CO2: 20 mmol/L — ABNORMAL LOW (ref 22–32)
Calcium: 7.4 mg/dL — ABNORMAL LOW (ref 8.9–10.3)
Creatinine, Ser: 8.99 mg/dL — ABNORMAL HIGH (ref 0.61–1.24)
GFR calc Af Amer: 7 mL/min — ABNORMAL LOW (ref >60)
GFR calc non Af Amer: 6 mL/min — ABNORMAL LOW (ref >60)
GLUCOSE: 164 mg/dL — AB (ref 65–99)
POTASSIUM: 3.8 mmol/L (ref 3.5–5.1)
Sodium: 132 mmol/L — ABNORMAL LOW (ref 135–145)

## 2017-05-06 LAB — GLUCOSE, CAPILLARY
GLUCOSE-CAPILLARY: 188 mg/dL — AB (ref 65–99)
Glucose-Capillary: 159 mg/dL — ABNORMAL HIGH (ref 65–99)
Glucose-Capillary: 175 mg/dL — ABNORMAL HIGH (ref 65–99)
Glucose-Capillary: 172 mg/dL — ABNORMAL HIGH (ref 65–99)

## 2017-05-06 LAB — ECHOCARDIOGRAM COMPLETE
HEIGHTINCHES: 76 in
Weight: 4000.03 oz

## 2017-05-06 LAB — CULTURE, BLOOD (ROUTINE X 2): SPECIAL REQUESTS: ADEQUATE

## 2017-05-06 MED ORDER — INSULIN ASPART 100 UNIT/ML ~~LOC~~ SOLN
2.0000 [IU] | Freq: Three times a day (TID) | SUBCUTANEOUS | Status: DC
  Administered 2017-05-07 – 2017-06-10 (×69): 2 [IU] via SUBCUTANEOUS

## 2017-05-06 NOTE — Progress Notes (Addendum)
[facility]  Date of Admission:  04/08/2017             ASSESSMENT/PLAN  Dylan [name] continues to be treated for the cervical abscess with daptomycin and cefepime following concern for vancomycin related nephrotoxicity. He is tolerating these antibiotics with no adverse side effects. Nephrology is following for worsening kidney function with creatinine continuing to climb now just under 9 and will likely require RRT. During this he was noted to have fungemia with candida galbrata identified in blood cultures taken from his PICC line that has since been removed. He has been seen by psychology for depression associated with his current medical condition.   1. Continue daptomycin and cefepime for the cervical abscess with end date 05/16/17. 2. Continue Eraxis for fungemia. Will need ophthalmology exam for fungal endophthalmitis/chorioretinitis. 3. Repeat blood cultures.  4. Continuing worsening renal function treatment per nephrology. Probable start HD soon.  5. We will continue to follow.   Patient seen and examined, agree with above note.  I dictated the care and orders written for this patient under my direction. I was immediately available on site, by phone/page to assist in the care of this patient.      Principal Problem:   MDD (major depressive disorder), single episode, moderate (HCC) Active Problems:   Uncontrolled diabetes mellitus type 2 without complications (HCC)   Hypertension   Morbid obesity (HCC)   Paroxysmal atrial fibrillation (HCC)   Multiple sclerosis exacerbation (HCC)   Pressure injury of skin   Hyperkalemia   AKI (acute kidney injury) (HCC)   Hematoma   Insomnia   Adjustment reaction with anxiety and depression   Quadriplegia (HCC)   History of fusion of cervical spine   Postoperative infection   Coronary artery disease   Atherosclerotic peripheral vascular disease (HCC)   . amiodarone  200 mg Oral Daily  . dexamethasone  3 mg  Oral Q8H  . diltiazem  60 mg Oral Q8H  . feeding supplement (GLUCERNA SHAKE)  237 mL Oral BID BM  . insulin aspart  0-5 Units Subcutaneous QHS  . insulin aspart  0-9 Units Subcutaneous TID WC  . insulin glargine  10 Units Subcutaneous Daily  . metoprolol tartrate  12.5 mg Oral BID  . mirtazapine  15 mg Oral QHS  . pantoprazole  40 mg Oral Daily    SUBJECTIVE:  Interval History:  Dylan [name] has remained afebrile. He continues to receive cefepime and daptomycin for a cervical spine abcess and Eraxis for fungemia with candida glabrata. Note the blood for the culture was drawn from his PICC line. His echocardiogram was completed today with results pending. He has not had an opthalmic exam. It appears the PICC line has been removed and peripheral access has been established. States he is doing "okay".   No Known Allergies   Review of Systems: Review of Systems  Constitutional: Negative for chills, diaphoresis and fever.  Respiratory: Negative for cough, shortness of breath and wheezing.   Cardiovascular: Negative for chest pain and leg swelling.  Gastrointestinal: Negative for abdominal pain, diarrhea, nausea and vomiting.  Skin: Negative for rash.  Psychiatric/Behavioral: Positive for depression.      OBJECTIVE: Vitals:   05/05/17 2112 05/05/17 2113 05/06/17 0454 05/06/17 0850  BP:  (!) 144/77 121/79 111/65  Pulse:  70 (!) 118 61  Resp:  17 17 18   Temp:  98.7 F (37.1 C) 98.5 F (36.9 C) 98.7 F (37.1 C)  TempSrc:  Oral Oral Oral  SpO2:  99% 99% 100%  Weight: 250 lb (113.4 kg)     Height: 6\' 4"  (1.93 m)      Body mass index is 30.43 kg/m.  Physical Exam  Constitutional: He is oriented to person, place, and time and well-developed, well-nourished, and in no distress. No distress.  Cardiovascular: Normal rate, regular rhythm, normal heart sounds and intact distal pulses. Exam reveals no gallop and no friction rub.  No murmur heard. Pulmonary/Chest: Effort normal and  breath sounds normal. No respiratory distress. He has no wheezes. He has no rales. He exhibits no tenderness.  Abdominal: Soft. Bowel sounds are normal. He exhibits no distension.  Neurological: He is alert and oriented to person, place, and time.  Skin: Skin is warm and dry.  Psychiatric: Affect and judgment normal. He exhibits a depressed mood.    Lab Results Lab Results  Component Value Date   WBC 22.8 (H) 05/02/2017   HGB 11.0 (L) 05/02/2017   HCT 32.4 (L) 05/02/2017   MCV 86.4 05/02/2017   PLT 98 (L) 05/02/2017    Lab Results  Component Value Date   CREATININE 8.99 (H) 05/06/2017   BUN 125 (H) 05/06/2017   NA 132 (L) 05/06/2017   K 3.8 05/06/2017   CL 92 (L) 05/06/2017   CO2 20 (L) 05/06/2017    Lab Results  Component Value Date   ALT 35 05-04-17   AST 20 05/04/2017   ALKPHOS 57 05-04-2017   BILITOT 0.7 05-04-2017     Microbiology: Recent Results (from the past 240 hour(s))  Urine culture     Status: None   Collection Time: 04-May-2017  9:21 AM  Result Value Ref Range Status   Specimen Description   Final    URINE, RANDOM Performed at [facility], [address] [place], [place] [address]    Special Requests   Final    NONE Performed at [facility], [address] [place], [place] [address]    Culture   Final    NO GROWTH Performed at [facility], [address] [place], [place] [address]    Report Status 05/02/2017 FINAL  Final  Blood culture (routine x 2)     Status: Abnormal   Collection Time: 05/04/17  9:25 AM  Result Value Ref Range Status   Specimen Description   Final    BLOOD PICC LINE Performed at [facility], [address], [place], [place] [address]    Special Requests   Final    BOTTLES DRAWN AEROBIC AND ANAEROBIC Blood Culture adequate volume Performed at [facility], [address] [place], [place] [address]    Culture  Setup Time   Final     YEAST ANAEROBIC BOTTLE ONLY CRITICAL RESULT CALLED TO, READ BACK BY AND VERIFIED WITHEarlean Shawl [facility] 1308 05/04/17 A BROWNING Performed at [facility], [address] [place], [place] [address]    Culture CANDIDA GLABRATA (A)  Final   Report Status 05/06/2017 FINAL  Final  Blood Culture ID Panel (Reflexed)     Status: Abnormal   Collection Time: May 04, 2017  9:25 AM  Result Value Ref Range Status   Enterococcus species NOT DETECTED NOT DETECTED Final   Listeria monocytogenes NOT DETECTED NOT DETECTED Final   Staphylococcus species NOT DETECTED NOT DETECTED Final   Staphylococcus aureus NOT DETECTED NOT DETECTED Final   Streptococcus species NOT DETECTED NOT DETECTED Final  Streptococcus agalactiae NOT DETECTED NOT DETECTED Final   Streptococcus pneumoniae NOT DETECTED NOT DETECTED Final   Streptococcus pyogenes NOT DETECTED NOT DETECTED Final   Acinetobacter baumannii NOT DETECTED NOT DETECTED Final   Enterobacteriaceae species NOT DETECTED NOT DETECTED Final   Enterobacter cloacae complex NOT DETECTED NOT DETECTED Final   Escherichia coli NOT DETECTED NOT DETECTED Final   Klebsiella oxytoca NOT DETECTED NOT DETECTED Final   Klebsiella pneumoniae NOT DETECTED NOT DETECTED Final   Proteus species NOT DETECTED NOT DETECTED Final   Serratia marcescens NOT DETECTED NOT DETECTED Final   Haemophilus influenzae NOT DETECTED NOT DETECTED Final   Neisseria meningitidis NOT DETECTED NOT DETECTED Final   Pseudomonas aeruginosa NOT DETECTED NOT DETECTED Final   Candida albicans NOT DETECTED NOT DETECTED Final   Candida glabrata DETECTED (A) NOT DETECTED Final    Comment: CRITICAL RESULT CALLED TO, READ BACK BY AND VERIFIED WITH: J GADHIA PHARMD 1829 05/04/17 A BROWNING    Candida krusei NOT DETECTED NOT DETECTED Final   Candida parapsilosis NOT DETECTED NOT DETECTED Final   Candida tropicalis NOT DETECTED NOT DETECTED Final    Comment: Performed at [facility], [address] [place], [place] [address]     Marcos Eke, NP [facility] [phone] Pager  05/06/2017  11:59 AM

## 2017-05-06 NOTE — Progress Notes (Signed)
  Echocardiogram 2D Echocardiogram has been performed.  Janalyn Harder 05/06/2017, 11:48 AM

## 2017-05-06 NOTE — Progress Notes (Signed)
PROGRESS NOTE  Dylan [name]  WUJ:[id number] DOB: 22-Jan-1966 DOA: 2017/05/30 PCP: Patient, No Pcp Per   Brief Narrative: Dylan [name] is a 52 y.o. male with a history of recently Dx multiple sclerosis, quadriplegia, and recently diagnosed cervical stenosis admitted to neurosurgery 04/12/2017 and underwent surgery for decompression. Postoperative course was complicated with atrial fibrillation placed on eliquis followed by development of epidural clot versus abscess seen on C-spine MRI.Because of the high risk patient decided not to repeat exploratory surgery. He was started on IV antibiotics due to complicated UTI possible cervical abscess and was discharged to skilled nursing facility on 04/09/2017 patient was supposed to be on vancomycin and cefepime until May 06, 2017. Vancomycin was stopped earlier due to increasing creatinine. Patient was found to have gradual progressive swelling of his neck a CT scan at that time showed prevertebral hematoma.  Patient presented to ED with abd pain, found to have Cr of 5.68 and concerns for non-draining foley cath. After removing foreign body, cath yielded 2.5L urine. Patient also noted to have K in excess of 6.4, given kayexelate. ID was consulted and recommended change to daptomycin and cefepime. Blood cultures grew C. glabrata, so PICC was removed for line holiday and anidulafungin was started. Neurology was consulted and recommended deferral of MS treatment to the outpatient setting as there is no evidence of an acute flare.   Assessment & Plan: Principal Problem:   MDD (major depressive disorder), single episode, moderate (HCC) Active Problems:   Uncontrolled diabetes mellitus type 2 without complications (HCC)   Hypertension   Morbid obesity (HCC)   Paroxysmal atrial fibrillation (HCC)   Multiple sclerosis exacerbation (HCC)   Pressure injury of skin   Hyperkalemia   AKI (acute kidney injury) (HCC)   Hematoma   Insomnia   Adjustment reaction  with anxiety and depression   Quadriplegia (HCC)   History of fusion of cervical spine   Postoperative infection   Coronary artery disease   Atherosclerotic peripheral vascular disease (HCC)  Acute kidney injury: Due to vancomycin toxicity (elevated levels despite discontinuation), obstructive uropathy (obstructed foley at admission), not responsive to IVF.  - Stopped vancomycin - Patient seen by urology with foreign body removed with flush. - Nephrology following, transferred to [facility] for initiation of RRT. IR consulted for HD cath.  Candidemia: Candida glabrata in blood cultures.  - On anidulafungin per ID. PICC removed.  - Will need to consult ophthalmology for retinal exam (r/o endophthalmitis, chorioretinitis) - Blood cultures repeated 4/4.  - Echocardiogram performed 4/4 without vegetation.  Cervical abscess vs. hematoma:  - Continue daptomycin and cefepime with end date 05/16/2017.  Quadriplegia: Related to cervical cord compression (abscess vs. hematoma), no change per patient. - Neurology had no further recommendations at this time.   Multiple sclerosis:  - Outpatient management with Dylan [name] recommended by neurology (signed off)  IDT2DM: HbA1c 8.4%. At inpatient goal without hypoglycemia - Holding oral medications including metformin.  - Continue lantus (decreased on admission 70u > 10u) - Continue SSI with added mealtime coverage of 2 units (currently getting more long acting than short acting)  Hyperkalemia: Resolved.  - Resolved - Repeat bmet in AM  Afib: RVR resolved - Titrating cardizem and lopressor, continuing amiodarone.  - Consider DC telemetry if RVR remains resolved.  - Anticoagulation is contraindicated  Depression - Psychiatry evaluation performed 3/31, will follow recommendations for trazodone. No inpatient management recommended.    DVT prophylaxis: SCDs Code Status: DNR confirmed at admission Family Communication: None  at bedside, declined  offer to call.  Disposition Plan: Uncertain  Consultants:   Neurology  Infectious disease  Nephrology  Procedures:  Echocardiogram 05/06/2017:  - Left ventricle: The cavity size was normal. Wall thickness was   increased in a pattern of moderate LVH. Indeterminant diastolic   function (atrial fibrillation). The estimated ejection fraction   was 55%. Although no diagnostic regional wall motion abnormality   was identified, this possibility cannot be completely excluded on   the basis of this study. - Aortic valve: There was no stenosis. - Mitral valve: Mildly calcified annulus. There was no significant   regurgitation. - Right ventricle: Poorly visualized. Probably normal size and   systolic function. - Pulmonary arteries: No complete TR doppler jet so unable to   estimate PA systolic pressure. - Inferior vena cava: The vessel was normal in size. The   respirophasic diameter changes were in the normal range (>= 50%),   consistent with normal central venous pressure.  Impressions:  - Technically difficult study with poor acoustic windows. Normal LV   size with moderate LV hypertrophy. EF 55%. Poorly visualized RV   but probably normal. No significant valvular abnormalities.  Antimicrobials: - Daptomycin and cefepime 4/1 >>  - Anidulafungin 4/3 >>   Subjective: Withdrawn, depressed, no new deficits or trouble swallowing or breathing. No pain anywhere.   Objective: Vitals:   05/06/17 0454 05/06/17 0850 05/06/17 1217 05/06/17 1709  BP: 121/79 111/65 127/69 130/71  Pulse: (!) 118 61 64 76  Resp: 17 18 19 17   Temp: 98.5 F (36.9 C) 98.7 F (37.1 C) 99 F (37.2 C) 99.2 F (37.3 C)  TempSrc: Oral Oral Oral Oral  SpO2: 99% 100% 96% 100%  Weight:      Height:        Intake/Output Summary (Last 24 hours) at 05/06/2017 1726 Last data filed at 05/06/2017 1000 Gross per 24 hour  Intake 550 ml  Output 250 ml  Net 300 ml   Filed Weights   05/03/17 0042 05/05/17 0559  05/05/17 2112  Weight: 120.4 kg (265 lb 6.4 oz) 127 kg (280 lb) 113.4 kg (250 lb)    Gen: 52 y.o. male in no distress  Pulm: Non-labored breathing room air. Clear to auscultation bilaterally.  CV: Regular rate and rhythm. No murmur, rub, or gallop. No JVD, no pedal edema. GI: Abdomen soft, non-tender, non-distended, with normoactive bowel sounds. No organomegaly or masses felt. GU: +Foley Ext: Warm, no deformities Skin: No rashes, lesions or ulcers Neuro: Alert and oriented. Flaccid quadriplegic.  Psych: Judgement and insight appear normal. Mood depressed, flat affect.   Data Reviewed: I have personally reviewed following labs and imaging studies  CBC: Recent Labs  Lab 05-05-17 0946 05/02/17 0411  WBC 23.3* 22.8*  NEUTROABS 20.7*  --   HGB 11.8* 11.0*  HCT 33.9* 32.4*  MCV 86.5 86.4  PLT 119* 98*   Basic Metabolic Panel: Recent Labs  Lab 05/03/17 0804 05/03/17 1331 05/03/17 1930 05/04/17 0016 05/04/17 0618 05/05/17 0420 05/06/17 1003  NA 136 137 134* 134* 134* 134* 132*  K 4.5 4.1 3.7 3.7 3.7 3.4* 3.8  CL 102 101 98* 98* 96* 93* 92*  CO2 17* 19* 20* 18* 22 22 20*  GLUCOSE 234* 238* 259* 245* 248* 215* 164*  BUN 107* 108* 89* 108* 100* 97* 125*  CREATININE 6.92* 7.00* 7.08* 7.32*  7.53* 7.45* 7.83* 8.99*  CALCIUM 7.6* 7.7* 7.4* 7.5* 7.5* 7.4* 7.4*  MG  --   --   --   --  1.8  --   --   PHOS 7.5* 7.2* 6.9* 6.9* 6.6*  --   --    GFR: Estimated Creatinine Clearance: 13.7 mL/min (A) (by C-G formula based on SCr of 8.99 mg/dL (H)). Liver Function Tests: Recent Labs  Lab 04/27/2017 0946  05/03/17 0804 05/03/17 1331 05/03/17 1930 05/04/17 0016 05/04/17 0618  AST 20  --   --   --   --   --   --   ALT 35  --   --   --   --   --   --   ALKPHOS 57  --   --   --   --   --   --   BILITOT 0.7  --   --   --   --   --   --   PROT 6.0*  --   --   --   --   --   --   ALBUMIN 2.4*   < > 2.0* 2.0* 2.0* 1.9* 1.9*   < > = values in this interval not displayed.   Recent  Labs  Lab 04/16/2017 0946  LIPASE 25   No results for input(s): AMMONIA in the last 168 hours. Coagulation Profile: No results for input(s): INR, PROTIME in the last 168 hours. Cardiac Enzymes: Recent Labs  Lab 04/06/2017 0946  CKTOTAL 36*   BNP (last 3 results) No results for input(s): PROBNP in the last 8760 hours. HbA1C: No results for input(s): HGBA1C in the last 72 hours. CBG: Recent Labs  Lab 05/05/17 1650 05/05/17 2235 05/06/17 0616 05/06/17 1118 05/06/17 1657  GLUCAP 218* 223* 159* 175* 172*   Lipid Profile: No results for input(s): CHOL, HDL, LDLCALC, TRIG, CHOLHDL, LDLDIRECT in the last 72 hours. Thyroid Function Tests: No results for input(s): TSH, T4TOTAL, FREET4, T3FREE, THYROIDAB in the last 72 hours. Anemia Panel: No results for input(s): VITAMINB12, FOLATE, FERRITIN, TIBC, IRON, RETICCTPCT in the last 72 hours. Urine analysis:    Component Value Date/Time   COLORURINE YELLOW 04/25/2017 0920   APPEARANCEUR TURBID (A) 04/20/2017 0920   LABSPEC 1.015 04/14/2017 0920   PHURINE 6.0 05/02/2017 0920   GLUCOSEU NEGATIVE 05/02/2017 0920   HGBUR LARGE (A) 04/17/2017 0920   BILIRUBINUR NEGATIVE 04/23/2017 0920   KETONESUR NEGATIVE 04/08/2017 0920   PROTEINUR 30 (A) 04/15/2017 0920   NITRITE NEGATIVE 04/26/2017 0920   LEUKOCYTESUR MODERATE (A) 04/13/2017 0920   Recent Results (from the past 240 hour(s))  Urine culture     Status: None   Collection Time: 04/10/2017  9:21 AM  Result Value Ref Range Status   Specimen Description   Final    URINE, RANDOM Performed at [facility], [address] [place], [place] [address]    Special Requests   Final    NONE Performed at [facility], [address], [place], [place] [address]    Culture   Final    NO GROWTH Performed at [facility], [address], [place], [place] [address]    Report Status 05/02/2017 FINAL  Final  Blood culture (routine x 2)     Status:  Abnormal   Collection Time: 04/04/2017  9:25 AM  Result Value Ref Range Status   Specimen Description   Final    BLOOD PICC LINE Performed at [facility], [address], [place], [place] [address]    Special Requests   Final    BOTTLES DRAWN AEROBIC AND ANAEROBIC  Blood Culture adequate volume Performed at [facility], [address], [place], [place] [address]    Culture  Setup Time   Final    YEAST ANAEROBIC BOTTLE ONLY CRITICAL RESULT CALLED TO, READ BACK BY AND VERIFIED WITHEarlean Shawl [facility] 6045 05/04/17 A BROWNING Performed at [facility], [address], [place], [place] [address]    Culture CANDIDA GLABRATA (A)  Final   Report Status 05/06/2017 FINAL  Final  Blood Culture ID Panel (Reflexed)     Status: Abnormal   Collection Time: 04/30/2017  9:25 AM  Result Value Ref Range Status   Enterococcus species NOT DETECTED NOT DETECTED Final   Listeria monocytogenes NOT DETECTED NOT DETECTED Final   Staphylococcus species NOT DETECTED NOT DETECTED Final   Staphylococcus aureus NOT DETECTED NOT DETECTED Final   Streptococcus species NOT DETECTED NOT DETECTED Final   Streptococcus agalactiae NOT DETECTED NOT DETECTED Final   Streptococcus pneumoniae NOT DETECTED NOT DETECTED Final   Streptococcus pyogenes NOT DETECTED NOT DETECTED Final   Acinetobacter baumannii NOT DETECTED NOT DETECTED Final   Enterobacteriaceae species NOT DETECTED NOT DETECTED Final   Enterobacter cloacae complex NOT DETECTED NOT DETECTED Final   Escherichia coli NOT DETECTED NOT DETECTED Final   Klebsiella oxytoca NOT DETECTED NOT DETECTED Final   Klebsiella pneumoniae NOT DETECTED NOT DETECTED Final   Proteus species NOT DETECTED NOT DETECTED Final   Serratia marcescens NOT DETECTED NOT DETECTED Final   Haemophilus influenzae NOT DETECTED NOT DETECTED Final   Neisseria meningitidis NOT DETECTED NOT DETECTED Final   Pseudomonas aeruginosa NOT DETECTED NOT DETECTED  Final   Candida albicans NOT DETECTED NOT DETECTED Final   Candida glabrata DETECTED (A) NOT DETECTED Final    Comment: CRITICAL RESULT CALLED TO, READ BACK BY AND VERIFIED WITH: J GADHIA PHARMD 1829 05/04/17 A BROWNING    Candida krusei NOT DETECTED NOT DETECTED Final   Candida parapsilosis NOT DETECTED NOT DETECTED Final   Candida tropicalis NOT DETECTED NOT DETECTED Final    Comment: Performed at [facility], [address], [place], [place] [address]      Radiology Studies: US Renal  Result Date: 05/05/2017 CLINICAL DATA:  52 year old male with acute renal injury. EXAM: RENAL / URINARY TRACT ULTRASOUND COMPLETE COMPARISON:  CT Abdomen and Pelvis 04/07/2017 FINDINGS: Right Kidney: Length: 12.6 centimeters. No mass or hydronephrosis visualized. Increased renal cortical echogenicity (image 2). Left Kidney: Length: 13.5 centimeters. No mass or hydronephrosis visualized. Increased left renal cortical echogenicity similar to the right kidney. Bladder: Decompressed, with a Foley catheter balloon visible. IMPRESSION: Bilateral increased renal cortical echogenicity compatible with chronic medical renal disease. Otherwise negative ultrasound appearance of both kidneys. Electronically Signed   By: Odessa Fleming M.D.   On: 05/05/2017 12:02    Scheduled Meds: . amiodarone  200 mg Oral Daily  . dexamethasone  3 mg Oral Q8H  . diltiazem  60 mg Oral Q8H  . feeding supplement (GLUCERNA SHAKE)  237 mL Oral BID BM  . insulin aspart  0-5 Units Subcutaneous QHS  . insulin aspart  0-9 Units Subcutaneous TID WC  . insulin glargine  10 Units Subcutaneous Daily  . metoprolol tartrate  12.5 mg Oral BID  . mirtazapine  15 mg Oral QHS  . pantoprazole  40 mg Oral Daily   Continuous Infusions: . anidulafungin Stopped (05/06/17 0250)  . ceFEPime (MAXIPIME) IV Stopped (05/05/17 1738)  . DAPTOmycin (CUBICIN)  IV Stopped (05/06/17 1430)  LOS: 5 days   Time spent: 35 minutes.  Hazeline Junker, MD [facility] Pager [phone]  If 7PM-7AM, please contact night-coverage [email or web address] Password [facility] 05/06/2017, 5:26 PM

## 2017-05-06 NOTE — Progress Notes (Signed)
Nutrition Follow-up  DOCUMENTATION CODES:   Obesity unspecified  INTERVENTION:   Glucerna Shake po TID, each supplement provides 220 kcal and 10 grams of protein  NUTRITION DIAGNOSIS:   Inadequate oral intake related to poor appetite as evidenced by per patient/family report. -ongoing  GOAL:   Patient will meet greater than or equal to 90% of their needs -unmet  MONITOR:   PO intake, Supplement acceptance, Weight trends, I & O's, Labs  REASON FOR ASSESSMENT:   Low Braden    ASSESSMENT:   Pt with PMH of HTN, DM, MS, recent quadriplegia, s/p decompression spinal surgery on C5, C6 and C7 (03/25/17) developing multiple issues post op including new Afib and prevertebral hematoma at surgical sight. Pt presents with abdominal pain found AKI  Spoke with Dylan [name] briefly, he was sleeping during visit. Ate cornflakes for breakfast and is drinking glucerna shakes. Appetite is so-so per patient. Patient went back to sleep shortly after that that. Unsure of weight history, mostly seems to be stated.   Needs dialysis per nephrology, Cr continues to rise. Plan for HD today or tomorrow after cath placed by IR. Continues on IV Abx for cervical abscess.  PO 10-15% over the past two days. Seems tired.  Labs reviewed:  CBGs 175, 159 Na 132, Medications reviewed and include:  Remeron, Insulin, decadron  Diet Order:  DIET SOFT Room service appropriate? Yes; Fluid consistency: Thin  EDUCATION NEEDS:   Not appropriate for education at this time  Skin:  Skin Assessment: Skin Integrity Issues: Skin Integrity Issues:: Stage II Stage II: buttocks  Last BM:  05/05/2017  Height:   Ht Readings from Last 1 Encounters:  05/05/17 6\' 4"  (1.93 m)    Weight:   Wt Readings from Last 1 Encounters:  05/05/17 250 lb (113.4 kg)    Ideal Body Weight:  91.8 kg  BMI:  Body mass index is 30.43 kg/m.  Estimated Nutritional Needs:   Kcal:  2200-2400  Protein:  120-135 grams  Fluid:   >/= 2.2 L/d  Dylan [name]. Dylan Jarrard, MS, RD LDN Inpatient Clinical Dietitian Pager [phone]

## 2017-05-06 NOTE — Progress Notes (Signed)
Pt arrived to the floor around 2145, orders for an IVPB ERAXIS at 2000. NO documentation this was given prior to arrival.  call pharm and they are preparing it now.

## 2017-05-06 NOTE — Progress Notes (Signed)
[facility] Progress Note  Subjective: UOP worse again, 150 cc.  Creat up 8.8 today. Pt feels "bad", won't really elaborate on this.  No nausea/ vomiting/ diarrhea . Is tired.  NO SOB.   Vitals:   05/05/17 2113 05/06/17 0454 05/06/17 0850 05/06/17 1217  BP: (!) 144/77 121/79 111/65 127/69  Pulse: 70 (!) 118 61 64  Resp: 17 17 18 19   Temp: 98.7 F (37.1 C) 98.5 F (36.9 C) 98.7 F (37.1 C) 99 F (37.2 C)  TempSrc: Oral Oral Oral Oral  SpO2: 99% 99% 100% 96%  Weight:      Height:        Inpatient medications: . amiodarone  200 mg Oral Daily  . dexamethasone  3 mg Oral Q8H  . diltiazem  60 mg Oral Q8H  . feeding supplement (GLUCERNA SHAKE)  237 mL Oral BID BM  . insulin aspart  0-5 Units Subcutaneous QHS  . insulin aspart  0-9 Units Subcutaneous TID WC  . insulin glargine  10 Units Subcutaneous Daily  . metoprolol tartrate  12.5 mg Oral BID  . mirtazapine  15 mg Oral QHS  . pantoprazole  40 mg Oral Daily   . anidulafungin Stopped (05/06/17 0250)  . ceFEPime (MAXIPIME) IV Stopped (05/05/17 1738)  . DAPTOmycin (CUBICIN)  IV Stopped (05/04/17 1340)   acetaminophen, bisacodyl, labetalol, LORazepam, methocarbamol, ondansetron (ZOFRAN) IV, ondansetron, sodium chloride flush, traZODone  Exam: No distress, lying flat, alert and O x3, no jerking or asterxis No jvd Chest clear bilat RRR  Abd soft obese no ascites Ext 2+ pitting edema bilat feet, 1-2+ edema flanks/ hips Neuro gen'd weakness x 4 ext  CXR 3/30 - clear UA 04/01/17 > prot 30, tntc rbc/ wbc's, 1.017 UA 04/08/17 > prot neg, 1.010, many bact, tntc rbc/ wbc UA 05-17-2017 > prot 30, 1.015, few bact, 6-30 rbc/ tntc wbc UNa 4/3 > 74 UCr 4/3 > 38 CT abd 3/30 > Adrenals/Urinary Tract: Normal adrenal glands. No renal calculi. Minimal bilateral caliectasis is favored to be physiologic in the setting of bladder distension. There is symmetric mild bilateral perirenal edema. Bladder distension, despite Foley catheter  in place Renal US 4/3 > 12- 13 cm kidneys, no hydro, bilat ^echo, bladder decompressed w foley cath visible    Impression: 1  AKI: ATN d/t vanc toxicity. Creat continues to rise, didn't respond to vol expansion.  Will need to start on dialysis. Will ask IR to see for HD cath.  Plan HD today or tomorrow after cath placement. Expecting that renal function should recover on its own within 1- 2 weeks in the majority of cases.    2   Quadriplegic - related to MS and cervical cord compression (abscess and/or hematoma) - on IV abx through mid April (vanc dc'd, on dapto/ cefepime). Seen again by neuro yest, no indication for MS Rx at this time, risks outweigh benefits.   3   Candidemia - PICC line dc'd, on IV antifungal  4   DNR   Plan - will follow   Vinson Moselle MD [facility] pager [phone]   05/06/2017, 1:10 PM   Recent Labs  Lab 05/03/17 1930 05/04/17 0016 05/04/17 0618 05/05/17 0420 05/06/17 1003  NA 134* 134* 134* 134* 132*  K 3.7 3.7 3.7 3.4* 3.8  CL 98* 98* 96* 93* 92*  CO2 20* 18* 22 22 20*  GLUCOSE 259* 245* 248* 215* 164*  BUN 89* 108* 100* 97* 125*  CREATININE 7.08* 7.32*  7.53*  7.45* 7.83* 8.99*  CALCIUM 7.4* 7.5* 7.5* 7.4* 7.4*  PHOS 6.9* 6.9* 6.6*  --   --    Recent Labs  Lab 04/29/2017 0946  05/03/17 1930 05/04/17 0016 05/04/17 0618  AST 20  --   --   --   --   ALT 35  --   --   --   --   ALKPHOS 57  --   --   --   --   BILITOT 0.7  --   --   --   --   PROT 6.0*  --   --   --   --   ALBUMIN 2.4*   < > 2.0* 1.9* 1.9*   < > = values in this interval not displayed.   Recent Labs  Lab 04/24/2017 0946 05/02/17 0411  WBC 23.3* 22.8*  NEUTROABS 20.7*  --   HGB 11.8* 11.0*  HCT 33.9* 32.4*  MCV 86.5 86.4  PLT 119* 98*   Iron/TIBC/Ferritin/ %Sat No results found for: IRON, TIBC, FERRITIN, IRONPCTSAT

## 2017-05-06 NOTE — Progress Notes (Addendum)
Inpatient Diabetes Program Recommendations  AACE/ADA: New Consensus Statement on Inpatient Glycemic Control (2015)  Target Ranges:  Prepandial:   less than 140 mg/dL      Peak postprandial:   less than 180 mg/dL (1-2 hours)      Critically ill patients:  140 - 180 mg/dL   Results for ANDRIK, SANDT (MRN [id number]) as of 05/06/2017 08:27  Ref. Range 05/05/2017 07:36 05/05/2017 12:09 05/05/2017 16:50 05/05/2017 22:35 05/06/2017 06:16  Glucose-Capillary Latest Ref Range: 65 - 99 mg/dL 295 (H) 621 (H) 308 (H) 223 (H) 159 (H)   Review of Glycemic Control  Diabetes history: DM2 Outpatient Diabetes medications: Lantus 70 units QHS, Novolog 10 units TID with meals plus additional units for correction, Glumetza 1000 mg daily Current orders for Inpatient glycemic control: Lantus 10 units daily, Novolog 0-9 units TID with meals, Novolog 0-5 units QHS; Decadron 3 mg Q8H  Inpatient Diabetes Program Recommendations: Insulin - Basal: Please consider increasing Lantus to 12 units daily. Insulin - Meal Coverage: Please consider ordering Novolog 4 units TID with meals for meal coverage. Diet: Added Carb Mod to Soft diet.  Thanks, Orlando Penner, RN, MSN, CDE Diabetes Coordinator Inpatient Diabetes Program [phone] (Team Pager from 8am to 5pm)

## 2017-05-07 ENCOUNTER — Inpatient Hospital Stay (HOSPITAL_COMMUNITY): Payer: Medicaid Other

## 2017-05-07 ENCOUNTER — Encounter (HOSPITAL_COMMUNITY): Payer: Self-pay | Admitting: Interventional Radiology

## 2017-05-07 DIAGNOSIS — B377 Candidal sepsis: Secondary | ICD-10-CM

## 2017-05-07 HISTORY — PX: IR FLUORO GUIDE CV LINE RIGHT: IMG2283

## 2017-05-07 HISTORY — PX: IR US GUIDE VASC ACCESS RIGHT: IMG2390

## 2017-05-07 LAB — GLUCOSE, CAPILLARY
GLUCOSE-CAPILLARY: 145 mg/dL — AB (ref 65–99)
Glucose-Capillary: 219 mg/dL — ABNORMAL HIGH (ref 65–99)
Glucose-Capillary: 204 mg/dL — ABNORMAL HIGH (ref 65–99)

## 2017-05-07 LAB — CBC
HEMATOCRIT: 27.6 % — AB (ref 39.0–52.0)
Hemoglobin: 9.5 g/dL — ABNORMAL LOW (ref 13.0–17.0)
MCH: 28.6 pg (ref 26.0–34.0)
MCHC: 34.4 g/dL (ref 30.0–36.0)
MCV: 83.1 fL (ref 78.0–100.0)
Platelets: 67 10*3/uL — ABNORMAL LOW (ref 150–400)
RBC: 3.32 MIL/uL — AB (ref 4.22–5.81)
RDW: 13.5 % (ref 11.5–15.5)
WBC: 13.7 10*3/uL — AB (ref 4.0–10.5)

## 2017-05-07 LAB — CK: Total CK: 176 U/L (ref 49–397)

## 2017-05-07 LAB — RENAL FUNCTION PANEL
ANION GAP: 20 — AB (ref 5–15)
Albumin: 1.8 g/dL — ABNORMAL LOW (ref 3.5–5.0)
BUN: 141 mg/dL — ABNORMAL HIGH (ref 6–20)
CHLORIDE: 90 mmol/L — AB (ref 101–111)
CO2: 19 mmol/L — ABNORMAL LOW (ref 22–32)
Calcium: 7.2 mg/dL — ABNORMAL LOW (ref 8.9–10.3)
Creatinine, Ser: 9.61 mg/dL — ABNORMAL HIGH (ref 0.61–1.24)
GFR, EST AFRICAN AMERICAN: 7 mL/min — AB (ref >60)
GFR, EST NON AFRICAN AMERICAN: 6 mL/min — AB (ref >60)
Glucose, Bld: 202 mg/dL — ABNORMAL HIGH (ref 65–99)
POTASSIUM: 4.1 mmol/L (ref 3.5–5.1)
Phosphorus: 7.8 mg/dL — ABNORMAL HIGH (ref 2.5–4.6)
Sodium: 129 mmol/L — ABNORMAL LOW (ref 135–145)

## 2017-05-07 MED ORDER — SODIUM CHLORIDE 0.9 % IV SOLN: 100.0000 mL | INTRAVENOUS | Status: DC | PRN

## 2017-05-07 MED ORDER — HEPARIN SODIUM (PORCINE) 1000 UNIT/ML IJ SOLN
INTRAMUSCULAR | Status: AC
  Filled 2017-05-07: qty 1

## 2017-05-07 MED ORDER — PENTAFLUOROPROP-TETRAFLUOROETH EX AERO: 1.0000 "application " | INHALATION_SPRAY | CUTANEOUS | Status: DC | PRN

## 2017-05-07 MED ORDER — LIDOCAINE-PRILOCAINE 2.5-2.5 % EX CREA: 1.0000 "application " | TOPICAL_CREAM | CUTANEOUS | Status: DC | PRN

## 2017-05-07 MED ORDER — LIDOCAINE HCL (PF) 1 % IJ SOLN
INTRAMUSCULAR | Status: DC | PRN
  Administered 2017-05-07: 2 mL

## 2017-05-07 MED ORDER — INSULIN GLARGINE 100 UNIT/ML ~~LOC~~ SOLN
12.0000 [IU] | Freq: Every day | SUBCUTANEOUS | Status: DC
  Administered 2017-05-09 – 2017-06-10 (×32): 12 [IU] via SUBCUTANEOUS
  Filled 2017-05-07 (×36): qty 0.12

## 2017-05-07 MED ORDER — HEPARIN SODIUM (PORCINE) 1000 UNIT/ML IJ SOLN
INTRAMUSCULAR | Status: DC | PRN
  Administered 2017-05-07: 2800 [IU] via INTRAVENOUS

## 2017-05-07 MED ORDER — SODIUM CHLORIDE 0.9 % IV SOLN
900.0000 mg | INTRAVENOUS | Status: AC
  Administered 2017-05-08 – 2017-05-16 (×5): 900 mg via INTRAVENOUS
  Filled 2017-05-07 (×5): qty 18

## 2017-05-07 MED ORDER — LIDOCAINE HCL 1 % IJ SOLN
INTRAMUSCULAR | Status: AC
  Filled 2017-05-07: qty 20

## 2017-05-07 MED ORDER — HEPARIN SODIUM (PORCINE) 1000 UNIT/ML DIALYSIS
1000.0000 [IU] | INTRAMUSCULAR | Status: DC | PRN
  Administered 2017-05-07: 1000 [IU] via INTRAVENOUS_CENTRAL

## 2017-05-07 NOTE — Progress Notes (Signed)
PROGRESS NOTE  Dylan [name]  UJW:[id number] DOB: 01-Oct-1965 DOA: 04/18/2017 PCP: Patient, No Pcp Per   Brief Narrative: Dylan [name] is a 52 y.o. male with a history of recently Dx multiple sclerosis, quadriplegia, and recently diagnosed cervical stenosis admitted to neurosurgery 04/12/2017 and underwent surgery for decompression. Postoperative course was complicated with atrial fibrillation placed on eliquis followed by development of epidural clot versus abscess seen on C-spine MRI.Because of the high risk patient decided not to repeat exploratory surgery. He was started on IV antibiotics due to complicated UTI possible cervical abscess and was discharged to skilled nursing facility on 04/09/2017 patient was supposed to be on vancomycin and cefepime until May 06, 2017. Vancomycin was stopped earlier due to increasing creatinine. Patient was found to have gradual progressive swelling of his neck a CT scan at that time showed prevertebral hematoma.  Patient presented to ED with abd pain, found to have Cr of 5.68 and concerns for non-draining foley cath. After removing foreign body, cath yielded 2.5L urine. Patient also noted to have K in excess of 6.4, given kayexelate. ID was consulted and recommended change to daptomycin and cefepime. Blood cultures grew C. glabrata, so PICC was removed for line holiday and anidulafungin was started. Neurology was consulted and recommended deferral of MS treatment to the outpatient setting as there is no evidence of an acute flare.   Assessment & Plan: Principal Problem:   MDD (major depressive disorder), single episode, moderate (HCC) Active Problems:   Uncontrolled diabetes mellitus type 2 without complications (HCC)   Hypertension   Morbid obesity (HCC)   Paroxysmal atrial fibrillation (HCC)   Multiple sclerosis exacerbation (HCC)   Pressure injury of skin   Hyperkalemia   AKI (acute kidney injury) (HCC)   Hematoma   Insomnia   Adjustment reaction  with anxiety and depression   Quadriplegia (HCC)   History of fusion of cervical spine   Postoperative wound infection   Coronary artery disease   Atherosclerotic peripheral vascular disease (HCC)   Candidemia (HCC)  Acute kidney injury: Due to vancomycin toxicity (elevated levels despite discontinuation), obstructive uropathy (obstructed foley at admission), not responsive to IVF.  - Stopped vancomycin - Patient seen by urology with foreign body removed with flush. Continue to monitor output. - Nephrology to coordinate HD 4/5. HD catheter placed by IR 4/5.  Candidemia: Candida glabrata in blood cultures.  - On anidulafungin per ID with current end date 4/16 (if repeat cultures remain negative). PICC removed.  - Consulted ophthalmology, Dr. Sherryll Burger for exam to r/o endophthalmitis, chorioretinitis. Appreciate his help. - Blood cultures repeated 4/4.  - Echocardiogram performed 4/4 without vegetation.  Cervical abscess vs. hematoma:  - Continue daptomycin and cefepime with end date 05/16/2017.  Quadriplegia: Related to cervical cord compression (abscess vs. hematoma), no change per patient. - Neurology had no further recommendations at this time.   Multiple sclerosis:  - Outpatient management with Dr. Epimenio Foot recommended by neurology (signed off)  IDT2DM: HbA1c 8.4%. At inpatient goal without hypoglycemia - Holding oral medications including metformin.  - Continue lantus (decreased on admission 70u > 10u, now will increase modestly and continue to monitor) - Continue SSI + 2 units TIDWC  Hyperkalemia: Resolved prior to HD.  - Monitoring daily labs  Afib: RVR resolved - Titrating cardizem and lopressor, continuing amiodarone.  - Anticoagulation is contraindicated  Depression - Psychiatry evaluation performed 3/31, will follow recommendations for trazodone. No inpatient management recommended.   DVT prophylaxis: SCDs Code Status: DNR confirmed  at admission Family  Communication: None at bedside, declined offer to call.  Disposition Plan: Uncertain  Consultants:   Neurology  Infectious disease  Nephrology  Procedures:  Echocardiogram 05/06/2017:  - Left ventricle: The cavity size was normal. Wall thickness was   increased in a pattern of moderate LVH. Indeterminant diastolic   function (atrial fibrillation). The estimated ejection fraction   was 55%. Although no diagnostic regional wall motion abnormality   was identified, this possibility cannot be completely excluded on   the basis of this study. - Aortic valve: There was no stenosis. - Mitral valve: Mildly calcified annulus. There was no significant   regurgitation. - Right ventricle: Poorly visualized. Probably normal size and   systolic function. - Pulmonary arteries: No complete TR doppler jet so unable to   estimate PA systolic pressure. - Inferior vena cava: The vessel was normal in size. The   respirophasic diameter changes were in the normal range (>= 50%),   consistent with normal central venous pressure.  Impressions:  - Technically difficult study with poor acoustic windows. Normal LV   size with moderate LV hypertrophy. EF 55%. Poorly visualized RV   but probably normal. No significant valvular abnormalities.  Antimicrobials: - Daptomycin and cefepime 4/1 >>  - Anidulafungin 4/3 >>   Subjective: Doing "ok" no changes. Denies pain.  Objective: Vitals:   05/07/17 0449 05/07/17 0631 05/07/17 1000 05/07/17 1137  BP: 131/84  119/84 (!) 147/82  Pulse: (!) 101  (!) 120 68  Resp: 18  16 16   Temp: 98.7 F (37.1 C)  99.3 F (37.4 C) 98.2 F (36.8 C)  TempSrc: Oral  Oral Oral  SpO2: 100%  94% 94%  Weight:  114.5 kg (252 lb 6.8 oz)    Height:        Intake/Output Summary (Last 24 hours) at 05/07/2017 1231 Last data filed at 05/07/2017 0849 Gross per 24 hour  Intake 190 ml  Output -  Net 190 ml   Filed Weights   05/05/17 0559 05/05/17 2112 05/07/17 0631    Weight: 127 kg (280 lb) 113.4 kg (250 lb) 114.5 kg (252 lb 6.8 oz)    Gen: 52 y.o. male in no distress  Pulm: Non-labored breathing room air. Clear to auscultation bilaterally.  CV: Regular borderline tachycardia. No murmur, rub, or gallop. No JVD, no pedal edema. GI: Abdomen soft, non-tender, non-distended, with normoactive bowel sounds. No organomegaly or masses felt. GU: +Foley Ext: Warm, no deformities Skin: No rashes, lesions or ulcers Neuro: Alert and oriented. Has 1/5 strength in shoulders, elbow, otherwise flaccid quadriplegia. Psych: Judgement and insight appear normal. Mood depressed, flat affect.   Data Reviewed: I have personally reviewed following labs and imaging studies  CBC: Recent Labs  Lab 04/25/2017 0946 05/02/17 0411  WBC 23.3* 22.8*  NEUTROABS 20.7*  --   HGB 11.8* 11.0*  HCT 33.9* 32.4*  MCV 86.5 86.4  PLT 119* 98*   Basic Metabolic Panel: Recent Labs  Lab 05/03/17 0804 05/03/17 1331 05/03/17 1930 05/04/17 0016 05/04/17 0618 05/05/17 0420 05/06/17 1003  NA 136 137 134* 134* 134* 134* 132*  K 4.5 4.1 3.7 3.7 3.7 3.4* 3.8  CL 102 101 98* 98* 96* 93* 92*  CO2 17* 19* 20* 18* 22 22 20*  GLUCOSE 234* 238* 259* 245* 248* 215* 164*  BUN 107* 108* 89* 108* 100* 97* 125*  CREATININE 6.92* 7.00* 7.08* 7.32*  7.53* 7.45* 7.83* 8.99*  CALCIUM 7.6* 7.7* 7.4* 7.5* 7.5* 7.4* 7.4*  MG  --   --   --   --  1.8  --   --   PHOS 7.5* 7.2* 6.9* 6.9* 6.6*  --   --    GFR: Estimated Creatinine Clearance: 13.8 mL/min (A) (by C-G formula based on SCr of 8.99 mg/dL (H)). Liver Function Tests: Recent Labs  Lab 04/21/2017 0946  05/03/17 0804 05/03/17 1331 05/03/17 1930 05/04/17 0016 05/04/17 0618  AST 20  --   --   --   --   --   --   ALT 35  --   --   --   --   --   --   ALKPHOS 57  --   --   --   --   --   --   BILITOT 0.7  --   --   --   --   --   --   PROT 6.0*  --   --   --   --   --   --   ALBUMIN 2.4*   < > 2.0* 2.0* 2.0* 1.9* 1.9*   < > = values in  this interval not displayed.   Recent Labs  Lab 04/22/2017 0946  LIPASE 25   No results for input(s): AMMONIA in the last 168 hours. Coagulation Profile: No results for input(s): INR, PROTIME in the last 168 hours. Cardiac Enzymes: Recent Labs  Lab 04/30/2017 0946 05/07/17 0538  CKTOTAL 36* 176   BNP (last 3 results) No results for input(s): PROBNP in the last 8760 hours. HbA1C: No results for input(s): HGBA1C in the last 72 hours. CBG: Recent Labs  Lab 05/06/17 1118 05/06/17 1657 05/06/17 2234 05/07/17 0617 05/07/17 1135  GLUCAP 175* 172* 188* 219* 204*   Lipid Profile: No results for input(s): CHOL, HDL, LDLCALC, TRIG, CHOLHDL, LDLDIRECT in the last 72 hours. Thyroid Function Tests: No results for input(s): TSH, T4TOTAL, FREET4, T3FREE, THYROIDAB in the last 72 hours. Anemia Panel: No results for input(s): VITAMINB12, FOLATE, FERRITIN, TIBC, IRON, RETICCTPCT in the last 72 hours. Urine analysis:    Component Value Date/Time   COLORURINE YELLOW 04/19/2017 0920   APPEARANCEUR TURBID (A) 04/16/2017 0920   LABSPEC 1.015 04/17/2017 0920   PHURINE 6.0 04/30/2017 0920   GLUCOSEU NEGATIVE 04/23/2017 0920   HGBUR LARGE (A) 04/04/2017 0920   BILIRUBINUR NEGATIVE 04/14/2017 0920   KETONESUR NEGATIVE 04/17/2017 0920   PROTEINUR 30 (A) 04/11/2017 0920   NITRITE NEGATIVE 04/20/2017 0920   LEUKOCYTESUR MODERATE (A) 04/12/2017 0920   Recent Results (from the past 240 hour(s))  Urine culture     Status: None   Collection Time: 04/08/2017  9:21 AM  Result Value Ref Range Status   Specimen Description   Final    URINE, RANDOM Performed at [facility], [address] [place], [place] [address]    Special Requests   Final    NONE Performed at [facility], [address], [place], [place] [address]    Culture   Final    NO GROWTH Performed at [facility], [address], [place], [place] [address]    Report Status 05/02/2017  FINAL  Final  Blood culture (routine x 2)     Status: Abnormal   Collection Time: 04/06/2017  9:25 AM  Result Value Ref Range Status   Specimen Description   Final    BLOOD PICC LINE Performed at [facility], [address], [place], [place] [address]  Special Requests   Final    BOTTLES DRAWN AEROBIC AND ANAEROBIC Blood Culture adequate volume Performed at [facility], [address] [place], [place] [address]    Culture  Setup Time   Final    YEAST ANAEROBIC BOTTLE ONLY CRITICAL RESULT CALLED TO, READ BACK BY AND VERIFIED WITHEarlean Shaw[facility] 6440 05/04/17 A BROWNING Performed at [facility], [address], [place], [place] [address]    Culture CANDIDA GLABRATA (A)  Final   Report Status 05/06/2017 FINAL  Final  Blood Culture ID Panel (Reflexed)     Status: Abnormal   Collection Time: 04/23/2017  9:25 AM  Result Value Ref Range Status   Enterococcus species NOT DETECTED NOT DETECTED Final   Listeria monocytogenes NOT DETECTED NOT DETECTED Final   Staphylococcus species NOT DETECTED NOT DETECTED Final   Staphylococcus aureus NOT DETECTED NOT DETECTED Final   Streptococcus species NOT DETECTED NOT DETECTED Final   Streptococcus agalactiae NOT DETECTED NOT DETECTED Final   Streptococcus pneumoniae NOT DETECTED NOT DETECTED Final   Streptococcus pyogenes NOT DETECTED NOT DETECTED Final   Acinetobacter baumannii NOT DETECTED NOT DETECTED Final   Enterobacteriaceae species NOT DETECTED NOT DETECTED Final   Enterobacter cloacae complex NOT DETECTED NOT DETECTED Final   Escherichia coli NOT DETECTED NOT DETECTED Final   Klebsiella oxytoca NOT DETECTED NOT DETECTED Final   Klebsiella pneumoniae NOT DETECTED NOT DETECTED Final   Proteus species NOT DETECTED NOT DETECTED Final   Serratia marcescens NOT DETECTED NOT DETECTED Final   Haemophilus influenzae NOT DETECTED NOT DETECTED Final   Neisseria meningitidis NOT DETECTED NOT DETECTED  Final   Pseudomonas aeruginosa NOT DETECTED NOT DETECTED Final   Candida albicans NOT DETECTED NOT DETECTED Final   Candida glabrata DETECTED (A) NOT DETECTED Final    Comment: CRITICAL RESULT CALLED TO, READ BACK BY AND VERIFIED WITH: J GADHIA PHARMD 1829 05/04/17 A BROWNING    Candida krusei NOT DETECTED NOT DETECTED Final   Candida parapsilosis NOT DETECTED NOT DETECTED Final   Candida tropicalis NOT DETECTED NOT DETECTED Final    Comment: Performed at [facility], [address] [place], [place] [address]  Culture, blood (routine x 2)     Status: None (Preliminary result)   Collection Time: 05/06/17  2:33 PM  Result Value Ref Range Status   Specimen Description BLOOD LEFT HAND  Final   Special Requests   Final    BOTTLES DRAWN AEROBIC ONLY Blood Culture adequate volume   Culture   Final    NO GROWTH < 24 HOURS Performed at [facility], [address], [place], [place] [address]    Report Status PENDING  Incomplete  Culture, blood (routine x 2)     Status: None (Preliminary result)   Collection Time: 05/06/17  2:40 PM  Result Value Ref Range Status   Specimen Description BLOOD LEFT WRIST  Final   Special Requests   Final    BOTTLES DRAWN AEROBIC ONLY Blood Culture adequate volume   Culture   Final    NO GROWTH < 24 HOURS Performed at [facility], [address], [place], [place] [address]    Report Status PENDING  Incomplete      Radiology Studies: No results found.  Scheduled Meds: . heparin      . lidocaine      . amiodarone  200 mg Oral Daily  . dexamethasone  3 mg Oral Q8H  .  diltiazem  60 mg Oral Q8H  . feeding supplement (GLUCERNA SHAKE)  237 mL Oral BID BM  . insulin aspart  0-5 Units Subcutaneous QHS  . insulin aspart  0-9 Units Subcutaneous TID WC  . insulin aspart  2 Units Subcutaneous TID WC  . insulin glargine  10 Units Subcutaneous Daily  . metoprolol tartrate  12.5 mg Oral BID  . mirtazapine  15 mg Oral QHS  . pantoprazole  40 mg  Oral Daily   Continuous Infusions: . anidulafungin Stopped (05/07/17 0013)  . ceFEPime (MAXIPIME) IV Stopped (05/06/17 1801)  . [START ON 05/08/2017] DAPTOmycin (CUBICIN)  IV       LOS: 6 days   Time spent: 25 minutes.  Hazeline Junker, MD [facility] Pager [phone]  If 7PM-7AM, please contact night-coverage [email or web address] Password [facility] 05/07/2017, 12:31 PM

## 2017-05-07 NOTE — Progress Notes (Signed)
HD tx completed @ 1942 w/ bp issues early on that required day shift HD RN to lower UF goal, UF goal not met, blood rinsed back, VSS, Opthalmologist at the bedside for eval, report called to Gillis Santa, RN

## 2017-05-07 NOTE — Procedures (Signed)
Interventional Radiology Procedure Note  Procedure: Placement of a non-tunneled HD cathetr via right IJ.  Tips in RA and ready for use.   Complications: None  Estimated Blood Loss: None  Recommendations: - Routine line care   Signed,  Sterling Big, MD

## 2017-05-07 NOTE — Care Management Note (Signed)
Case Management Note  Patient Details  Name: Dylan [name] MRN: [id number] Date of Birth: 09/08/1965  Subjective/Objective:                    Action/Plan: Pt transferred to [facility] from [facility] hospital. He has been at [facility] prior to admission to [facility].  Pt went for temporary HD cath today. When patient is medically stable, plan is for him to return to [place]. CM following.  Expected Discharge Date:                  Expected Discharge Plan:  Skilled Nursing Facility  In-House Referral:  Clinical Social Work  Discharge planning Services     Post Acute Care Choice:    Choice offered to:     DME Arranged:    DME Agency:     HH Arranged:    HH Agency:     Status of Service:  In process, will continue to follow  If discussed at Long Length of Stay Meetings, dates discussed:    Additional Comments:  Kermit Balo, RN 05/07/2017, 1:19 PM

## 2017-05-07 NOTE — Progress Notes (Signed)
Patient arrived to unit by bed.  Reviewed treatment plan and this RN agrees with plan.  Report received from bedside RN, Thayer Ohm.  Consent verified .  Patient sleepy, oriented X 4.   Lung sounds diminished to ausculation in all fields. Generalized 1+ pitting edema. Cardiac:  Afib, rate controlled.  Removed caps and cleansed RIJ catheter with chlorhedxidine.  Aspirated ports of heparin and flushed them with saline per protocol.  Connected and secured lines, initiated treatment at 1612.  UF Goal of 3000 mL and net fluid removal 2.5 L.  Will continue to monitor.

## 2017-05-07 NOTE — Progress Notes (Signed)
[facility] Progress Note  Subjective: pt is w/o new c/o's.  Temp HD cath in place now.  No n/v/d, no confusion or jerking.     Vitals:   05/07/17 0449 05/07/17 0631 05/07/17 1000 05/07/17 1137  BP: 131/84  119/84 (!) 147/82  Pulse: (!) 101  (!) 120 68  Resp: 18  16 16   Temp: 98.7 F (37.1 C)  99.3 F (37.4 C) 98.2 F (36.8 C)  TempSrc: Oral  Oral Oral  SpO2: 100%  94% 94%  Weight:  114.5 kg (252 lb 6.8 oz)    Height:        Inpatient medications: . amiodarone  200 mg Oral Daily  . dexamethasone  3 mg Oral Q8H  . diltiazem  60 mg Oral Q8H  . feeding supplement (GLUCERNA SHAKE)  237 mL Oral BID BM  . heparin      . insulin aspart  0-5 Units Subcutaneous QHS  . insulin aspart  0-9 Units Subcutaneous TID WC  . insulin aspart  2 Units Subcutaneous TID WC  . [START ON 05/08/2017] insulin glargine  12 Units Subcutaneous Daily  . lidocaine      . metoprolol tartrate  12.5 mg Oral BID  . mirtazapine  15 mg Oral QHS  . pantoprazole  40 mg Oral Daily   . sodium chloride    . anidulafungin Stopped (05/07/17 0013)  . ceFEPime (MAXIPIME) IV Stopped (05/06/17 1801)  . [START ON 05/08/2017] DAPTOmycin (CUBICIN)  IV     sodium chloride, acetaminophen, bisacodyl, heparin, labetalol, lidocaine (PF), lidocaine-prilocaine, LORazepam, methocarbamol, ondansetron (ZOFRAN) IV, ondansetron, pentafluoroprop-tetrafluoroeth, sodium chloride flush, traZODone  Exam: No distress, lying flat, alert and O x3, no jerking or asterxis No jvd Chest clear bilat RRR  Abd soft obese no ascites Ext 2+ pitting edema bilat feet, 1-2+ edema flanks/ hips Neuro gen'd weakness x 4 ext  CXR 3/30 - clear UA 04/01/17 > prot 30, tntc rbc/ wbc's, 1.017 UA 04/08/17 > prot neg, 1.010, many bact, tntc rbc/ wbc UA 05-23-2017 > prot 30, 1.015, few bact, 6-30 rbc/ tntc wbc UNa 4/3 > 74 UCr 4/3 > 38 CT abd 3/30 > Adrenals/Urinary Tract: Normal adrenal glands. No renal calculi. Minimal bilateral caliectasis is  favored to be physiologic in the setting of bladder distension. There is symmetric mild bilateral perirenal edema. Bladder distension, despite Foley catheter in place Renal US 4/3 > 12- 13 cm kidneys, no hydro, bilat ^echo, bladder decompressed w foley cath visible    Impression: 1  AKI: ATN d/t vanc toxicity. No signs of renal recovery, UOP down.  Plan initiation of HD today, and HD again tomorrow. Get solute down. Await renal recovery . Pull some volume off, not too much.   2   Quadriplegic - pt had C-spine surgery on 04/12/17 for severe stenosis at C5-6 and C6-7 causing R arm/ leg weakness and gait instability.  MRI also showed demyelinating lesions in brain and spine. Underwent decompression surgery on 03/25/08 by neurosurgery. Had afib and was anticoagulated and on 2/28 was seen again by nsurg w/ now complete C8 motor paralysis below C8. MRI showed hematoma w cord swelling.  Pt felt to be too high risk for surgery so was treated conservatively.  3   Candidemia - PICC line dc'd, on IV antifungal  4   DNR   Plan - will follow   Vinson Moselle MD [facility] pager [phone]   05/07/2017, 2:53 PM   Recent Labs  Lab 05/03/17 1930  05/04/17 0016 05/04/17 0618 05/05/17 0420 05/06/17 1003  NA 134* 134* 134* 134* 132*  K 3.7 3.7 3.7 3.4* 3.8  CL 98* 98* 96* 93* 92*  CO2 20* 18* 22 22 20*  GLUCOSE 259* 245* 248* 215* 164*  BUN 89* 108* 100* 97* 125*  CREATININE 7.08* 7.32*  7.53* 7.45* 7.83* 8.99*  CALCIUM 7.4* 7.5* 7.5* 7.4* 7.4*  PHOS 6.9* 6.9* 6.6*  --   --    Recent Labs  Lab 04/28/2017 0946  05/03/17 1930 05/04/17 0016 05/04/17 0618  AST 20  --   --   --   --   ALT 35  --   --   --   --   ALKPHOS 57  --   --   --   --   BILITOT 0.7  --   --   --   --   PROT 6.0*  --   --   --   --   ALBUMIN 2.4*   < > 2.0* 1.9* 1.9*   < > = values in this interval not displayed.   Recent Labs  Lab 04/17/2017 0946 05/02/17 0411  WBC 23.3* 22.8*  NEUTROABS 20.7*  --    HGB 11.8* 11.0*  HCT 33.9* 32.4*  MCV 86.5 86.4  PLT 119* 98*   Iron/TIBC/Ferritin/ %Sat No results found for: IRON, TIBC, FERRITIN, IRONPCTSAT

## 2017-05-07 NOTE — Progress Notes (Signed)
Pharmacy Antibiotic Note  Dylan [name] is a 52 y.o. male admitted on 2017/05/17 with spinal abscess. Patient began treatment with vancomycin and cefepime on 04/09/17 with plan to continue through 05/16/17.  Patient has worsening renal function so vancomycin was switched to Cubicin.  He as fungemia and is on Eraxis.  Renal may start HD today.  Afebrile and last WBC was 22.8.  CK is WNL but has trended up significantly.     Plan: Reduce Cubicin to 900mg  IV Q48H (~ 8 mg/kg TBW) Cefepime 1gm IV Q24H CK qFri  F/U plan for HD (will keep Cubicin at Q48H interval until stable on HD schedule, then switch to qHD)   Height: 6\' 4"  (193 cm) Weight: 252 lb 6.8 oz (114.5 kg) IBW/kg (Calculated) : 86.8  Temp (24hrs), Avg:98.8 F (37.1 C), Min:98.1 F (36.7 C), Max:99.3 F (37.4 C)  Recent Labs  Lab May 17, 2017 0946 17-May-2017 0953 2017/05/17 1152 May 17, 2017 1926 2017/05/17 2145 05/02/17 0411  05/02/17 1845  05/03/17 1930 05/04/17 0016 05/04/17 0618 05/05/17 0420 05/06/17 1003  WBC 23.3*  --   --   --   --  22.8*  --   --   --   --   --   --   --   --   CREATININE 5.68*  --   --  5.99*  --  6.17*   < >  --    < > 7.08* 7.32*  7.53* 7.45* 7.83* 8.99*  LATICACIDVEN  --  2.58* 2.58* 7.8* 6.5*  --   --   --   --   --   --   --   --   --   VANCORANDOM  --   --   --   --   --   --   --  23*  --   --   --   --   --   --    < > = values in this interval not displayed.    Estimated Creatinine Clearance: 13.8 mL/min (A) (by C-G formula based on SCr of 8.99 mg/dL (H)).    No Known Allergies    Vanc 3/8 PTA >> 3/29 Cefepime 3/28 PTA >> (4/14) Cubicin 3/31 >> (4/14) Eraxis 4/2 >>  3/31 VR = 57 mcg/mL (500mg  q12) 3/30 CK = 36 (prior to Cubicin) 4/5 CK = 176  3/6 BCx: NGF 3/30 UCx - negative 3/30 BCx PICC line: C.glabrata 4/4 BCx -    Salisa Broz D. Laney Potash, PharmD, BCPS Pager:  [phone] 05/07/2017, 11:13 AM

## 2017-05-07 NOTE — Progress Notes (Signed)
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 Likely have source control with removal of PICC line. Repeat blood cultures drawn on 4/4 are pending. He is going to require an HD catheter to receive RRT per nephrology. Nephrology expecting his renal function to improve in 1-2 weeks.  He appears to remain depressed regarding his current situation. Neurology indicates no evidence of MS flair as risk outweighs benefit of treatment currently.   1. Continue Cefepime and Daptomycin for cervical abscess with end date of 05/16/17.  2. Continue Eraxis for fungemia. Likely have source control, however we will continue to monitor cultures. 3. Decreasing renal function with plan for HD per nephrology. 4. Continue treatment of depression per primary team and psychiatry.   5. We will continue to follow.     Principal Problem:   MDD (major depressive disorder), single episode, moderate (HCC) Active Problems:   Uncontrolled diabetes mellitus type 2 without complications (HCC)   Hypertension   Morbid obesity (HCC)   Paroxysmal atrial fibrillation (HCC)   Multiple sclerosis exacerbation (HCC)   Pressure injury of skin   Hyperkalemia   AKI (acute kidney injury) (HCC)   Hematoma   Insomnia   Adjustment reaction with anxiety and depression   Quadriplegia (HCC)   History of fusion of cervical spine   Postoperative infection   Coronary artery disease   Atherosclerotic peripheral vascular disease (HCC)   . amiodarone  200 mg Oral Daily  . dexamethasone  3 mg Oral Q8H  . diltiazem  60 mg Oral Q8H  . feeding supplement (GLUCERNA SHAKE)  237 mL Oral BID BM  . insulin aspart  0-5 Units Subcutaneous QHS  . insulin aspart  0-9 Units Subcutaneous TID WC  . insulin aspart  2 Units Subcutaneous TID WC  . insulin glargine  10  Units Subcutaneous Daily  . metoprolol tartrate  12.5 mg Oral BID  . mirtazapine  15 mg Oral QHS  . pantoprazole  40 mg Oral Daily    SUBJECTIVE:  Afebrile overnight. Blood cultures drawn 4/4 and are in process. Continues to receive Daptomycin and Cefepime for cervical abscess and Eraxis for fungemia. No adverse side effects. Denies fevers, chills, or night sweats. Nephrology planning for dialysis with insertion of HD catheter secondary to worsening renal function, however anticipating improvement.   Feeling a little better today. Slept okay last night with no new concerns. Has questions of when he will be getting dialysis.   No Known Allergies   Review of Systems: Review of Systems  Constitutional: Negative for chills and fever.  Respiratory: Negative for cough, shortness of breath and wheezing.   Cardiovascular: Negative for chest pain.  Gastrointestinal: Negative for abdominal pain, constipation, diarrhea, nausea and vomiting.  Skin: Negative for rash.  Neurological: Positive for weakness. Negative for headaches.  Psychiatric/Behavioral: Positive for depression.      OBJECTIVE: Vitals:   05/06/17 1949 05/07/17 0019 05/07/17 0449 05/07/17 0631  BP: 137/76 137/75 131/84   Pulse: 73 75 (!) 101   Resp:  18 18   Temp: 98.1 F (36.7 C) 98.7 F (37.1 C) 98.7 F (37.1 C)   TempSrc: Oral Oral Oral   SpO2: 100% 97% 100%   Weight:    252 lb 6.8 oz (114.5 kg)  Height:  Body mass index is 30.73 kg/m.  Physical Exam  Constitutional: He is oriented to person, place, and time and well-developed, well-nourished, and in no distress. No distress.  Cardiovascular: Normal rate, regular rhythm, normal heart sounds and intact distal pulses. Exam reveals no gallop and no friction rub.  No murmur heard. Pulmonary/Chest: Effort normal and breath sounds normal. No respiratory distress. He has no wheezes. He has no rales. He exhibits no tenderness.  Abdominal: Soft. Bowel sounds are  normal. He exhibits no distension. There is no tenderness.  Neurological: He is alert and oriented to person, place, and time.  Skin: Skin is warm and dry.  Psychiatric: He exhibits a depressed mood. He has a flat affect.    Lab Results Lab Results  Component Value Date   WBC 22.8 (H) 05/02/2017   HGB 11.0 (L) 05/02/2017   HCT 32.4 (L) 05/02/2017   MCV 86.4 05/02/2017   PLT 98 (L) 05/02/2017    Lab Results  Component Value Date   CREATININE 8.99 (H) 05/06/2017   BUN 125 (H) 05/06/2017   NA 132 (L) 05/06/2017   K 3.8 05/06/2017   CL 92 (L) 05/06/2017   CO2 20 (L) 05/06/2017    Lab Results  Component Value Date   ALT 35 05/02/2017   AST 20 04/29/2017   ALKPHOS 57 04/14/2017   BILITOT 0.7 04/02/2017     Microbiology: Recent Results (from the past 240 hour(s))  Urine culture     Status: None   Collection Time: 04/27/2017  9:21 AM  Result Value Ref Range Status   Specimen Description   Final    URINE, RANDOM Performed at [facility], [address] [place], [place] [address]    Special Requests   Final    NONE Performed at [facility], [address] [place], [place] [address]    Culture   Final    NO GROWTH Performed at [facility], [address], [place], [place] [address]    Report Status 05/02/2017 FINAL  Final  Blood culture (routine x 2)     Status: Abnormal   Collection Time: 04/15/2017  9:25 AM  Result Value Ref Range Status   Specimen Description   Final    BLOOD PICC LINE Performed at [facility], [address], [place], [place] [address]    Special Requests   Final    BOTTLES DRAWN AEROBIC AND ANAEROBIC Blood Culture adequate volume Performed at [facility], [address], [place], [place] [address]    Culture  Setup Time   Final    YEAST ANAEROBIC BOTTLE ONLY CRITICAL RESULT CALLED TO, READ BACK BY AND VERIFIED WITHEarlean Shawl [facility] 1308 05/04/17 A  BROWNING Performed at [facility], [address] [place], [place] [address]    Culture CANDIDA GLABRATA (A)  Final   Report Status 05/06/2017 FINAL  Final  Blood Culture ID Panel (Reflexed)     Status: Abnormal   Collection Time: 04/20/2017  9:25 AM  Result Value Ref Range Status   Enterococcus species NOT DETECTED NOT DETECTED Final   Listeria monocytogenes NOT DETECTED NOT DETECTED Final   Staphylococcus species NOT DETECTED NOT DETECTED Final   Staphylococcus aureus NOT DETECTED NOT DETECTED Final   Streptococcus species NOT DETECTED NOT DETECTED Final   Streptococcus agalactiae NOT DETECTED NOT DETECTED Final   Streptococcus pneumoniae NOT DETECTED NOT DETECTED Final   Streptococcus pyogenes NOT DETECTED NOT  DETECTED Final   Acinetobacter baumannii NOT DETECTED NOT DETECTED Final   Enterobacteriaceae species NOT DETECTED NOT DETECTED Final   Enterobacter cloacae complex NOT DETECTED NOT DETECTED Final   Escherichia coli NOT DETECTED NOT DETECTED Final   Klebsiella oxytoca NOT DETECTED NOT DETECTED Final   Klebsiella pneumoniae NOT DETECTED NOT DETECTED Final   Proteus species NOT DETECTED NOT DETECTED Final   Serratia marcescens NOT DETECTED NOT DETECTED Final   Haemophilus influenzae NOT DETECTED NOT DETECTED Final   Neisseria meningitidis NOT DETECTED NOT DETECTED Final   Pseudomonas aeruginosa NOT DETECTED NOT DETECTED Final   Candida albicans NOT DETECTED NOT DETECTED Final   Candida glabrata DETECTED (A) NOT DETECTED Final    Comment: CRITICAL RESULT CALLED TO, READ BACK BY AND VERIFIED WITH: J GADHIA PHARMD 1829 05/04/17 A BROWNING    Candida krusei NOT DETECTED NOT DETECTED Final   Candida parapsilosis NOT DETECTED NOT DETECTED Final   Candida tropicalis NOT DETECTED NOT DETECTED Final    Comment: Performed at [facility], [address], [place], [place] [address]     Dylan Eke, NP [facility] [phone] Pager  05/07/2017  9:06 AM

## 2017-05-07 NOTE — Progress Notes (Signed)
Pt arrived back from HD.

## 2017-05-07 NOTE — Progress Notes (Signed)
ICS given to Pt. Educated the family and Pt on how to use it. Pt has a little congestion. Suction set-up at bedside. Level Low at 500.

## 2017-05-07 NOTE — Consult Note (Signed)
CC:  Chief Complaint  Patient presents with  . Abdominal Pain  . Hypoglycemia    HPI: Dylan [name] is a 52 y.o. male w/ POH of RE and PMH below who presented w/ abdominal pain and hypoglycemia and was admitted for AKI. No visual complaints. No floaters. Ophthalmology consulted for + blood culture for fungus.   ROS: See HPI  PMH: Past Medical History:  Diagnosis Date  . 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 Diabetes mellitus without complication ([place])   . Hypertension   . MS (congenital mitral stenosis) 2019  . Persistent atrial fibrillation with rapid ventricular response ([place]) 03/27/2017    PSH: Past Surgical History:  Procedure Laterality Date  . ANTERIOR CERVICAL DECOMP/DISCECTOMY FUSION N/A 03/25/2017   Procedure: ANTERIOR CERVICAL DECOMPRESSION/DISCECTOMY FUSION CERVICAL FIVE- CERVICAL SIX, CERVICAL SIX- CERVICAL SEVEN, CORPECTOMY;  Surgeon: Consuella Lose, MD;  Location: [place];  Service: Neurosurgery;  Laterality: N/A;  . IR FLUORO GUIDE CV LINE RIGHT  05/07/2017  . IR US GUIDE VASC ACCESS RIGHT  05/07/2017  . NOSE SURGERY     AS A CHILD    Meds: No current facility-administered medications on file prior to encounter.    Current Outpatient Medications on File Prior to Encounter  Medication Sig Dispense Refill  . acetaminophen (TYLENOL) 325 MG tablet Take 650 mg by mouth every 4 (four) hours as needed.    Marland Kitchen amiodarone (PACERONE) 200 MG tablet Take 1 tablet (200 mg total) by mouth daily.    Marland Kitchen b complex vitamins tablet Take 1 tablet by mouth daily.    . bisacodyl (DULCOLAX) 5 MG EC tablet Take 1 tablet (5 mg total) by mouth daily as needed for moderate constipation. 30 tablet 0  . ceFEPime (MAXIPIME) IVPB Inject 2 g into the vein every 12 (twelve) hours. Indication:  Spinal abscess Last Day of Therapy:  05/16/2017 Labs - Once weekly:  CBC/D and BMP, Labs - Every other week:  ESR and CRP 78 Units 0  . collagenase (SANTYL) ointment Apply 1 application topically daily as  needed (wound care).     Marland Kitchen dexamethasone (DECADRON) 1.5 MG tablet Take 2 tablets (3 mg total) by mouth every 8 (eight) hours.    Marland Kitchen diltiazem (CARDIZEM) 30 MG tablet Take 1 tablet (30 mg total) by mouth every 8 (eight) hours.    Marland Kitchen GLUCERNA (GLUCERNA) LIQD Glucerna 1.5 - Give 240 ml three times daily    . insulin aspart (NOVOLOG FLEXPEN) 100 UNIT/ML FlexPen Inject as per sliding scale subcutaneously before meals: 0 - 120 = 0 Units 121 - 150 = 2 units 151 - 200 = 4 units 201 - 250 = 6 units 251 - 300 = 8 units 301 - 350 = 10 units 351 - 400 = 12 units 401 - 450 = 14 units Notify Provider is less than 60 or greater than 450    . insulin aspart (NOVOLOG FLEXPEN) 100 UNIT/ML FlexPen Inject 10 Units into the skin 3 (three) times daily with meals.     . Insulin Glargine (LANTUS SOLOSTAR) 100 UNIT/ML Solostar Pen Inject 70 Units into the skin at bedtime.     Marland Kitchen LORazepam (ATIVAN) 0.5 MG tablet Take 1 tablet (0.5 mg total) by mouth every 6 (six) hours as needed for anxiety. X 2 weeks 60 tablet 0  . Melatonin 3 MG TABS Take 3 mg by mouth at bedtime.    . metFORMIN (GLUMETZA) 1000 MG (MOD) 24 hr tablet Take 1,000 mg by mouth daily with breakfast.    .  methocarbamol (ROBAXIN) 500 MG tablet Take 1 tablet (500 mg total) by mouth every 6 (six) hours as needed for muscle spasms.    . Multiple Vitamin (MULTIVITAMIN) tablet Take 1 tablet by mouth daily.    . ondansetron (ZOFRAN) 4 MG tablet Take 1 tablet (4 mg total) by mouth every 6 (six) hours as needed for nausea. 20 tablet 0  . pantoprazole (PROTONIX) 40 MG tablet Take 1 tablet (40 mg total) by mouth daily.    . promethazine (PHENERGAN) 25 MG/ML injection Inject 25 mg into the vein every 8 (eight) hours as needed for nausea or vomiting.    . senna-docusate (SENOKOT-S) 8.6-50 MG tablet Take 1 tablet by mouth at bedtime as needed for mild constipation.    . sertraline (ZOLOFT) 50 MG tablet Take 50 mg by mouth daily.     . sodium chloride 0.9 % injection  Inject 10 mLs into the vein 2 (two) times daily. Before and after antibiotics    . traZODone (DESYREL) 50 MG tablet Take 50 mg by mouth at bedtime as needed for sleep.    Marland Kitchen zolpidem (AMBIEN) 10 MG tablet Take 10 mg by mouth at bedtime as needed for sleep.    . vancomycin IVPB Inject 500 mg into the vein every 12 (twelve) hours. Indication:  Spinal abscess Last Day of Therapy:  05/16/2017 Labs - Sunday/Monday:  CBC/D, BMP, and vancomycin trough. Labs - Thursday:  BMP and vancomycin trough Labs - Every other week:  ESR and CRP (Patient not taking: Reported on 04/06/2017) 78 Units 0    SH: Social History   Socioeconomic History  . Marital status: Single    Spouse name: Not on file  . Number of children: Not on file  . Years of education: Not on file  . Highest education level: Not on file  Occupational History  . Not on file  Social Needs  . Financial resource strain: Not on file  . Food insecurity:    Worry: Not on file    Inability: Not on file  . Transportation needs:    Medical: Not on file    Non-medical: Not on file  Tobacco Use  . Smoking status: Never Smoker  . Smokeless tobacco: Never Used  Substance and Sexual Activity  . Alcohol use: No    Frequency: Never  . Drug use: No  . Sexual activity: Not on file  Lifestyle  . Physical activity:    Days per week: Not on file    Minutes per session: Not on file  . Stress: Not on file  Relationships  . Social connections:    Talks on phone: Not on file    Gets together: Not on file    Attends religious service: Not on file    Active member of club or organization: Not on file    Attends meetings of clubs or organizations: Not on file    Relationship status: Not on file  Other Topics Concern  . Not on file  Social History Narrative  . Not on file    FH: Family History  Problem Relation Age of Onset  . Prostate cancer Father     Exam:  Lucianne Lei: OD: 20/400 [place] - didn't have glasses OS: 20/400 [place] - didn't have  glasses  CVF: OD: full OS: full  EOM: OD: full d/v OS: full d/v  Pupils: OD: 3->2 mm, no APD OS: 3->2 mm, no APD  IOP:  OD: soft OS: soft  External: OD: no periorbital  edema, good orbicularis strength OS: no periorbital edema, good orbicularis strength   Pen Light Exam: L/L: OD: WNL OS: WNL  C/S: OD: white and quiet OS: white and quiet  K: OD: clear, no abnormal staining OS: clear, no abnormal staining  A/C: OD: grossly deep and quiet appearing by pen light OS: grossly deep and quiet appearing by pen light  I: OD: round and regular OS: round and regular  L: OD: NSC OS: NSC  DFE: dilated @  8:00 PM w/ Tropic and Phenyl OU  V: OD: clear OS: clear  N: OD: C/D 0.4, no disc edema OS: C/D 0.4, no disc edema  M: OD: flat, no obvious macular pathology OS: flat, no obvious macular pathology  V: OD: normal appearing vessels OS: normal appearing vessels  P: OD: retina flat 360, no obvious mass/RT/RD OS: retina flat 360, no obvious mass/RT/RD  A/P:  1. Fungemia: - NO evidence of intraocular involvement - FU PRN  Mckell Riecke T. Manuella Ghazi, MD

## 2017-05-07 NOTE — Progress Notes (Signed)
Pt has been turned throughout the night. ROM exercise has been performed to upper and lower extremities. Pt felt a lot better after.

## 2017-05-08 LAB — GLUCOSE, CAPILLARY
GLUCOSE-CAPILLARY: 112 mg/dL — AB (ref 65–99)
GLUCOSE-CAPILLARY: 118 mg/dL — AB (ref 65–99)
Glucose-Capillary: 139 mg/dL — ABNORMAL HIGH (ref 65–99)
Glucose-Capillary: 133 mg/dL — ABNORMAL HIGH (ref 65–99)

## 2017-05-08 LAB — BASIC METABOLIC PANEL
ANION GAP: 14 (ref 5–15)
BUN: 76 mg/dL — ABNORMAL HIGH (ref 6–20)
CALCIUM: 7.1 mg/dL — AB (ref 8.9–10.3)
CO2: 23 mmol/L (ref 22–32)
CREATININE: 6.05 mg/dL — AB (ref 0.61–1.24)
Chloride: 97 mmol/L — ABNORMAL LOW (ref 101–111)
GFR calc non Af Amer: 10 mL/min — ABNORMAL LOW (ref >60)
GFR, EST AFRICAN AMERICAN: 11 mL/min — AB (ref >60)
Glucose, Bld: 138 mg/dL — ABNORMAL HIGH (ref 65–99)
Potassium: 4.3 mmol/L (ref 3.5–5.1)
SODIUM: 134 mmol/L — AB (ref 135–145)

## 2017-05-08 LAB — HEPATITIS B SURFACE ANTIGEN: HEP B S AG: NEGATIVE

## 2017-05-08 LAB — HEPATITIS B SURFACE ANTIBODY,QUALITATIVE: HEP B S AB: REACTIVE

## 2017-05-08 LAB — HEPATITIS B CORE ANTIBODY, TOTAL: HEP B C TOTAL AB: POSITIVE — AB

## 2017-05-08 MED ORDER — SODIUM CHLORIDE 0.9 % IV SOLN: INTRAVENOUS | Status: DC

## 2017-05-08 NOTE — Progress Notes (Addendum)
PROGRESS NOTE  TARAY NORMOYLE  ZOX:[id number] DOB: October 27, 1965 DOA: 04/10/2017 PCP: Patient, No Pcp Per   Brief Narrative: KEAUN SCHNABEL is a 52 y.o. 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 abscess seen on MRI. He was also diagnosed with MS at that time. Neurosurgery felt the risks greatly outweighed benefits of repeat exploratory surgery. He was started on IV antibiotics due to complicated UTI, possible cervical abscess and was discharged to [facility] 3/8 with PICC, plan to continue vancomycin and cefepime until 4/4. Vancomycin was stopped earlier due to increasing creatinine.   Patient presented to ED with abd pain, found to have Cr of 5.68 and concerns for non-draining foley cath. After removing foreign body, cath yielded 2.5L urine. Patient also noted to have K in excess of 6.4, given kayexelate. ID was consulted and recommended change to daptomycin and cefepime. Blood cultures grew C. glabrata, so PICC was removed for line holiday and anidulafungin was started. Neurology was consulted and recommended deferral of MS treatment to the outpatient setting as there is no evidence of an acute flare. Nephrology was consulted with progressive oliguric renal failure. HD cath placed 4/5 and HD started.  Assessment & Plan: Principal Problem:   MDD (major depressive disorder), single episode, moderate (HCC) Active Problems:   Uncontrolled diabetes mellitus type 2 without complications (HCC)   Hypertension   Morbid obesity (HCC)   Paroxysmal atrial fibrillation (HCC)   Multiple sclerosis exacerbation (HCC)   Pressure injury of skin   Hyperkalemia   AKI (acute kidney injury) (HCC)   Hematoma   Insomnia   Adjustment reaction with anxiety and depression   Quadriplegia (HCC)   History of fusion of cervical spine   Postoperative wound infection   Coronary artery disease   Atherosclerotic peripheral vascular disease (HCC)   Candidemia (HCC)  Acute kidney injury: Due to vancomycin toxicity (elevated levels despite discontinuation), obstructive uropathy (obstructed foley at admission), not responsive to IVF.  - Stopped vancomycin - Patient seen by urology with foreign body removed with flush. Continue to monitor output. - Nephrology to coordinating HD 4/5, 4/6. HD catheter placed by IR 4/5.  Candidemia, technically was septic on arrival which has improved: Candida glabrata in blood cultures.  - On anidulafungin per ID with current end date 4/16 (if repeat cultures remain negative). PICC removed.  - No intraocular involvement on ophtho exam 4/5. - Blood cultures repeated 4/4, NGTD - Echocardiogram performed 4/4 without vegetation.  Cervical abscess vs. hematoma:  - Continue daptomycin and cefepime with end date 05/16/2017.  Quadriplegia: Related to cervical cord compression (abscess vs. hematoma), no change per patient. - Neurology had no further recommendations at this time. - Neurosurgery felt too high risk for surgery  Multiple sclerosis:  - Outpatient management with Dr. Epimenio Foot recommended by neurology (signed off)  IDT2DM: HbA1c 8.4%. At inpatient goal without hypoglycemia - Holding oral medications including metformin.  - Continue lantus (decreased on admission 70u > 10u, now will increase modestly and continue to monitor) - Continue SSI + 2 units TIDWC  Hyperkalemia: Resolved prior to HD.  - Monitoring daily labs  Afib: RVR resolved - Continue cardizem, lopressor, amiodarone. - Anticoagulation is contraindicated  Depression - Psychiatry evaluation performed 3/31, will follow recommendations for trazodone. No inpatient management recommended.   DVT prophylaxis: SCDs  Code Status: DNR confirmed at admission Family Communication: None at  bedside Disposition Plan: Uncertain  Consultants:   Neurology  Infectious disease  Nephrology  Procedures:  Echocardiogram 05/06/2017:  - Left ventricle: The cavity size was normal. Wall thickness was   increased in a pattern of moderate LVH. Indeterminant diastolic   function (atrial fibrillation). The estimated ejection fraction   was 55%. Although no diagnostic regional wall motion abnormality   was identified, this possibility cannot be completely excluded on   the basis of this study. - Aortic valve: There was no stenosis. - Mitral valve: Mildly calcified annulus. There was no significant   regurgitation. - Right ventricle: Poorly visualized. Probably normal size and   systolic function. - Pulmonary arteries: No complete TR doppler jet so unable to   estimate PA systolic pressure. - Inferior vena cava: The vessel was normal in size. The   respirophasic diameter changes were in the normal range (>= 50%),   consistent with normal central venous pressure.  Impressions: - Technically difficult study with poor acoustic windows. Normal LV   size with moderate LV hypertrophy. EF 55%. Poorly visualized RV   but probably normal. No significant valvular abnormalities.  HD 4/5, 4/6  Antimicrobials: - Daptomycin and cefepime 4/1 >>  - Anidulafungin 4/3 >>   Subjective: Feels wiped out after HD today, no nausea or vomiting. No changes in neuro status.  Objective: Vitals:   05/08/17 1000 05/08/17 1030 05/08/17 1052 05/08/17 1324  BP: 111/76 123/76 104/79 (!) 159/87  Pulse: 77 76 78 86  Resp: (!) 21 (!) 22 18 20   Temp:   98.2 F (36.8 C) 98.1 F (36.7 C)  TempSrc:   Oral Oral  SpO2:   96% 98%  Weight:   111.7 kg (246 lb 4.1 oz)   Height:        Intake/Output Summary (Last 24 hours) at 05/08/2017 1505 Last data filed at 05/08/2017 1052 Gross per 24 hour  Intake 370 ml  Output 1729 ml  Net -1359 ml   Filed Weights   05/07/17 1949 05/08/17 0715 05/08/17 1052    Weight: 113.2 kg (249 lb 9 oz) 112.4 kg (247 lb 12.8 oz) 111.7 kg (246 lb 4.1 oz)    Gen: 52 y.o. male in no distress  Pulm: Non-labored breathing room air. Clear to auscultation bilaterally.  CV: Regular rate and rhythm. No murmur, rub, or gallop. No JVD, no pedal edema. GI: Abdomen soft, non-tender, non-distended, with normoactive bowel sounds. No organomegaly or masses felt. GU: +Foley Ext: Warm, no deformities Skin: No rashes, lesions or ulcers Neuro: Alert and oriented. Has 1/5 strength in shoulders, elbow, otherwise flaccid quadriplegia. Psych: Judgement and insight appear normal. Mood depressed, flat affect.  Data Reviewed: I have personally reviewed following labs and imaging studies  CBC: Recent Labs  Lab 05/02/17 0411 05/07/17 1528  WBC 22.8* 13.7*  HGB 11.0* 9.5*  HCT 32.4* 27.6*  MCV 86.4 83.1  PLT 98* 67*   Basic Metabolic Panel: Recent Labs  Lab 05/03/17 1331 05/03/17 1930 05/04/17 0016 05/04/17 0618 05/05/17 0420 05/06/17 1003 05/07/17 1528 05/08/17 0342  NA 137 134* 134* 134* 134* 132* 129* 134*  K 4.1 3.7 3.7 3.7 3.4* 3.8 4.1 4.3  CL 101 98* 98* 96* 93* 92* 90* 97*  CO2 19* 20* 18* 22 22 20* 19* 23  GLUCOSE 238* 259* 245* 248* 215* 164* 202* 138*  BUN 108* 89* 108* 100* 97* 125* 141* 76*  CREATININE 7.00* 7.08* 7.32*  7.53* 7.45* 7.83* 8.99* 9.61* 6.05*  CALCIUM 7.7* 7.4* 7.5* 7.5* 7.4* 7.4* 7.2* 7.1*  MG  --   --   --  1.8  --   --   --   --   PHOS 7.2* 6.9* 6.9* 6.6*  --   --  7.8*  --    GFR: Estimated Creatinine Clearance: 20.2 mL/min (A) (by C-G formula based on SCr of 6.05 mg/dL (H)). Liver Function Tests: Recent Labs  Lab 05/03/17 1331 05/03/17 1930 05/04/17 0016 05/04/17 0618 05/07/17 1528  ALBUMIN 2.0* 2.0* 1.9* 1.9* 1.8*   No results for input(s): LIPASE, AMYLASE in the last 168 hours. No results for input(s): AMMONIA in the last 168 hours. Coagulation Profile: No results for input(s): INR, PROTIME in the last 168  hours. Cardiac Enzymes: Recent Labs  Lab 05/07/17 0538  CKTOTAL 176   BNP (last 3 results) No results for input(s): PROBNP in the last 8760 hours. HbA1C: No results for input(s): HGBA1C in the last 72 hours. CBG: Recent Labs  Lab 05/07/17 0617 05/07/17 1135 05/07/17 2112 05/08/17 0619 05/08/17 1323  GLUCAP 219* 204* 145* 139* 112*   Lipid Profile: No results for input(s): CHOL, HDL, LDLCALC, TRIG, CHOLHDL, LDLDIRECT in the last 72 hours. Thyroid Function Tests: No results for input(s): TSH, T4TOTAL, FREET4, T3FREE, THYROIDAB in the last 72 hours. Anemia Panel: No results for input(s): VITAMINB12, FOLATE, FERRITIN, TIBC, IRON, RETICCTPCT in the last 72 hours. Urine analysis:    Component Value Date/Time   COLORURINE YELLOW 05-15-2017 0920   APPEARANCEUR TURBID (A) 15-May-2017 0920   LABSPEC 1.015 May 15, 2017 0920   PHURINE 6.0 15-May-2017 0920   GLUCOSEU NEGATIVE May 15, 2017 0920   HGBUR LARGE (A) May 15, 2017 0920   BILIRUBINUR NEGATIVE 05/15/17 0920   KETONESUR NEGATIVE 2017/05/15 0920   PROTEINUR 30 (A) 05-15-2017 0920   NITRITE NEGATIVE 05-15-17 0920   LEUKOCYTESUR MODERATE (A) 15-May-2017 0920   Recent Results (from the past 240 hour(s))  Urine culture     Status: None   Collection Time: 05/15/2017  9:21 AM  Result Value Ref Range Status   Specimen Description   Final    URINE, RANDOM Performed at [facility], [address] [place], [place] [address]    Special Requests   Final    NONE Performed at [facility], [address] [place], [place] [address]    Culture   Final    NO GROWTH Performed at [facility], [address] [place], [place] [address]    Report Status 05/02/2017 FINAL  Final  Blood culture (routine x 2)     Status: Abnormal   Collection Time: 05-15-2017  9:25 AM  Result Value Ref Range Status   Specimen Description   Final    BLOOD PICC LINE Performed at [facility], [address], [place], [place] [address]    Special Requests   Final    BOTTLES DRAWN AEROBIC AND ANAEROBIC Blood Culture adequate volume Performed at [facility], [address] [place], [place] [address]    Culture  Setup Time   Final    YEAST ANAEROBIC BOTTLE ONLY CRITICAL RESULT CALLED TO, READ BACK BY AND VERIFIED WITHEarlean Shawl [facility] 6962 05/04/17 A BROWNING Performed at [facility], [address], [place], [place] [address]    Culture CANDIDA GLABRATA (A)  Final   Report Status 05/06/2017 FINAL  Final  Blood Culture ID Panel (Reflexed)  Status: Abnormal   Collection Time: 2017-05-21  9:25 AM  Result Value Ref Range Status   Enterococcus species NOT DETECTED NOT DETECTED Final   Listeria monocytogenes NOT DETECTED NOT DETECTED Final   Staphylococcus species NOT DETECTED NOT DETECTED Final   Staphylococcus aureus NOT DETECTED NOT DETECTED Final   Streptococcus species NOT DETECTED NOT DETECTED Final   Streptococcus agalactiae NOT DETECTED NOT DETECTED Final   Streptococcus pneumoniae NOT DETECTED NOT DETECTED Final   Streptococcus pyogenes NOT DETECTED NOT DETECTED Final   Acinetobacter baumannii NOT DETECTED NOT DETECTED Final   Enterobacteriaceae species NOT DETECTED NOT DETECTED Final   Enterobacter cloacae complex NOT DETECTED NOT DETECTED Final   Escherichia coli NOT DETECTED NOT DETECTED Final   Klebsiella oxytoca NOT DETECTED NOT DETECTED Final   Klebsiella pneumoniae NOT DETECTED NOT DETECTED Final   Proteus species NOT DETECTED NOT DETECTED Final   Serratia marcescens NOT DETECTED NOT DETECTED Final   Haemophilus influenzae NOT DETECTED NOT DETECTED Final   Neisseria meningitidis NOT DETECTED NOT DETECTED Final   Pseudomonas aeruginosa NOT DETECTED NOT DETECTED Final   Candida albicans NOT DETECTED NOT DETECTED Final   Candida glabrata DETECTED (A) NOT DETECTED Final    Comment: CRITICAL RESULT CALLED TO, READ BACK BY AND VERIFIED  WITH: J GADHIA PHARMD 1829 05/04/17 A BROWNING    Candida krusei NOT DETECTED NOT DETECTED Final   Candida parapsilosis NOT DETECTED NOT DETECTED Final   Candida tropicalis NOT DETECTED NOT DETECTED Final    Comment: Performed at [facility], [address], [place], [place] [address]  Culture, blood (routine x 2)     Status: None (Preliminary result)   Collection Time: 05/06/17  2:33 PM  Result Value Ref Range Status   Specimen Description BLOOD LEFT HAND  Final   Special Requests   Final    BOTTLES DRAWN AEROBIC ONLY Blood Culture adequate volume   Culture   Final    NO GROWTH 2 DAYS Performed at [facility], [address] [place], [place] [address]    Report Status PENDING  Incomplete  Culture, blood (routine x 2)     Status: None (Preliminary result)   Collection Time: 05/06/17  2:40 PM  Result Value Ref Range Status   Specimen Description BLOOD LEFT WRIST  Final   Special Requests   Final    BOTTLES DRAWN AEROBIC ONLY Blood Culture adequate volume   Culture   Final    NO GROWTH 2 DAYS Performed at [facility], [address], [place], [place] [address]    Report Status PENDING  Incomplete      Radiology Studies: Ir Fluoro Guide Cv Line Right  Result Date: 05/07/2017 INDICATION: 52 year old male with acute kidney injury in need of hemodialysis. He presents for placement of a temporary hemodialysis catheter. EXAM: IR RIGHT FLOURO GUIDE CV LINE; IR ULTRASOUND GUIDANCE VASC ACCESS RIGHT MEDICATIONS: None ANESTHESIA/SEDATION: None FLUOROSCOPY TIME:  Fluoroscopy Time: 0 minutes 6 seconds (0.6 mGy). COMPLICATIONS: None immediate. PROCEDURE: Informed written consent was obtained from the patient after a thorough discussion of the procedural risks, benefits and alternatives. All questions were addressed. Maximal Sterile Barrier Technique was utilized including caps, mask, sterile gowns, sterile gloves, sterile drape, hand hygiene and skin antiseptic. A timeout was  performed prior to the initiation of the procedure. The right internal jugular vein was interrogated with ultrasound and found to be widely patent. An image was obtained and stored for the medical record.  Local anesthesia was attained by infiltration with 1% lidocaine. A small dermatotomy was made. Under real-time sonographic guidance, the vessel was punctured with a 21 gauge micropuncture needle. Using standard technique, the initial micro needle was exchanged over a 0.018 micro wire for a transitional 4 [place] micro sheath. The micro sheath was then exchanged over a 0.035 wire for a 10 [place] dilator. The skin tract was dilated. A 20 cm [place] Trialysis catheter was then advanced over the wire and position with the tip in the mid right atrium. The catheter flushed and aspirated with ease. The catheter was flushed with heparinized saline and secured to the skin with 0 Prolene suture. Sterile bandages were applied. IMPRESSION: Successful placement of a 20 cm non tunneled Trialysis catheter via the right internal jugular vein. The catheter tips are in the mid right atrium and ready for immediate use. Electronically Signed   By: Malachy Moan M.D.   On: 05/07/2017 16:46   Ir US Guide Vasc Access Right  Result Date: 05/07/2017 INDICATION: 52 year old male with acute kidney injury in need of hemodialysis. He presents for placement of a temporary hemodialysis catheter. EXAM: IR RIGHT FLOURO GUIDE CV LINE; IR ULTRASOUND GUIDANCE VASC ACCESS RIGHT MEDICATIONS: None ANESTHESIA/SEDATION: None FLUOROSCOPY TIME:  Fluoroscopy Time: 0 minutes 6 seconds (0.6 mGy). COMPLICATIONS: None immediate. PROCEDURE: Informed written consent was obtained from the patient after a thorough discussion of the procedural risks, benefits and alternatives. All questions were addressed. Maximal Sterile Barrier Technique was utilized including caps, mask, sterile gowns, sterile gloves, sterile drape, hand hygiene and skin antiseptic. A  timeout was performed prior to the initiation of the procedure. The right internal jugular vein was interrogated with ultrasound and found to be widely patent. An image was obtained and stored for the medical record. Local anesthesia was attained by infiltration with 1% lidocaine. A small dermatotomy was made. Under real-time sonographic guidance, the vessel was punctured with a 21 gauge micropuncture needle. Using standard technique, the initial micro needle was exchanged over a 0.018 micro wire for a transitional 4 [place] micro sheath. The micro sheath was then exchanged over a 0.035 wire for a 10 [place] dilator. The skin tract was dilated. A 20 cm [place] Trialysis catheter was then advanced over the wire and position with the tip in the mid right atrium. The catheter flushed and aspirated with ease. The catheter was flushed with heparinized saline and secured to the skin with 0 Prolene suture. Sterile bandages were applied. IMPRESSION: Successful placement of a 20 cm non tunneled Trialysis catheter via the right internal jugular vein. The catheter tips are in the mid right atrium and ready for immediate use. Electronically Signed   By: Malachy Moan M.D.   On: 05/07/2017 16:46    Scheduled Meds: . amiodarone  200 mg Oral Daily  . dexamethasone  3 mg Oral Q8H  . diltiazem  60 mg Oral Q8H  . feeding supplement (GLUCERNA SHAKE)  237 mL Oral BID BM  . insulin aspart  0-5 Units Subcutaneous QHS  . insulin aspart  0-9 Units Subcutaneous TID WC  . insulin aspart  2 Units Subcutaneous TID WC  . insulin glargine  12 Units Subcutaneous Daily  . metoprolol tartrate  12.5 mg Oral BID  . mirtazapine  15 mg Oral QHS  . pantoprazole  40 mg Oral Daily   Continuous Infusions: . anidulafungin Stopped (05/07/17 2315)  . ceFEPime (MAXIPIME) IV Stopped (05/07/17 2120)  . DAPTOmycin (CUBICIN)  IV  LOS: 7 days   Time spent: 25 minutes.  Hazeline Junker, MD [facility] Pager [phone]  If  7PM-7AM, please contact night-coverage [email or web address] Password [email or web address] 05/08/2017, 3:05 PM

## 2017-05-08 NOTE — Progress Notes (Addendum)
Dylan [name] Progress Note  Subjective: had HD yest and on again today. Drop BP once on HD y est to 70's, not tolerating much UF on HD today w/ sinus tach developed, have dec'd the goal.   Vitals:   05/08/17 0930 05/08/17 1000 05/08/17 1030 05/08/17 1052  BP: 99/84 111/76 123/76 104/79  Pulse: (!) 110 77 76 78  Resp: (!) 22 (!) 21 (!) 22 18  Temp:    98.2 F (36.8 C)  TempSrc:    Oral  SpO2:    96%  Weight:      Height:        Inpatient medications: . amiodarone  200 mg Oral Daily  . dexamethasone  3 mg Oral Q8H  . diltiazem  60 mg Oral Q8H  . feeding supplement (GLUCERNA SHAKE)  237 mL Oral BID BM  . insulin aspart  0-5 Units Subcutaneous QHS  . insulin aspart  0-9 Units Subcutaneous TID WC  . insulin aspart  2 Units Subcutaneous TID WC  . insulin glargine  12 Units Subcutaneous Daily  . metoprolol tartrate  12.5 mg Oral BID  . mirtazapine  15 mg Oral QHS  . pantoprazole  40 mg Oral Daily   . anidulafungin Stopped (05/07/17 2315)  . ceFEPime (MAXIPIME) IV Stopped (05/07/17 2120)  . DAPTOmycin (CUBICIN)  IV     acetaminophen, bisacodyl, heparin, labetalol, lidocaine (PF), LORazepam, methocarbamol, ondansetron (ZOFRAN) IV, ondansetron, sodium chloride flush, traZODone  Exam: No distress, lying flat, alert and O x3 No jvd Chest clear bilat RRR  Abd soft obese no ascites Ext 1+ hip dependent edema, o/w no edema Neuro gen'd weakness x 4 ext  CXR 3/30 - clear UA 04/01/17 > prot 30, tntc rbc/ wbc's, 1.017 UA 04/08/17 > prot neg, 1.010, many bact, tntc rbc/ wbc UA 04/11/2017 > prot 30, 1.015, few bact, 6-30 rbc/ tntc wbc UNa 4/3 > 74 UCr 4/3 > 38 CT abd 3/30 > Adrenals/Urinary Tract: Normal adrenal glands. No renal calculi. Minimal bilateral caliectasis is favored to be physiologic in the setting of bladder distension. There is symmetric mild bilateral perirenal edema. Bladder distension, despite Foley catheter in place Renal US 4/3 > 12- 13 cm kidneys, no hydro,  bilat ^echo, bladder decompressed w foley cath visible  CT chest 3/30 - lungs free of edema   Impression: 1  AKI: ATN d/t vanc toxicity. No signs of renal recovery, UOP down.  Still not making urine, 2nd HD today.  Will not pull fluid as not tolerating well.  Labs show good response to HD x 1.   2   Quadriplegic - pt had C-spine surgery on 04/12/17 for severe stenosis at C5-6 and C6-7 causing R arm/ leg weakness and gait instability.  MRI also showed demyelinating lesions in brain and spine. Underwent decompression surgery on 03/25/08 by neurosurgery. Had afib and was anticoagulated and on 2/28 was seen again by nsurg w/ now complete C8 motor paralysis below C8. MRI showed hematoma w cord swelling.  Pt felt to be too high risk for surgery so was treated conservatively.  3   Candidemia - PICC line dc'd, on IV antifungal  4   Afib: RVR resolved, on po dilt and metoprolol for this, and po amiodarone. Not getting anticoag as contraindicated   5  HTN: Bp's on lower side, getting dilt / metop for afib rate control  6  DM2: on lantus, SSI and meal coverage, per primary   Plan - will follow   Dylan [name]  Dylan Gulino MD  Kidney [name] pager [phone]   05/08/2017, 11:53 AM   Recent Labs  Lab 05/04/17 0016 05/04/17 0618  05/06/17 1003 05/07/17 1528 05/08/17 0342  NA 134* 134*   < > 132* 129* 134*  K 3.7 3.7   < > 3.8 4.1 4.3  CL 98* 96*   < > 92* 90* 97*  CO2 18* 22   < > 20* 19* 23  GLUCOSE 245* 248*   < > 164* 202* 138*  BUN 108* 100*   < > 125* 141* 76*  CREATININE 7.32*  7.53* 7.45*   < > 8.99* 9.61* 6.05*  CALCIUM 7.5* 7.5*   < > 7.4* 7.2* 7.1*  PHOS 6.9* 6.6*  --   --  7.8*  --    < > = values in this interval not displayed.   Recent Labs  Lab 05/04/17 0016 05/04/17 0618 05/07/17 1528  ALBUMIN 1.9* 1.9* 1.8*   Recent Labs  Lab 05/02/17 0411 05/07/17 1528  WBC 22.8* 13.7*  HGB 11.0* 9.5*  HCT 32.4* 27.6*  MCV 86.4 83.1  PLT 98* 67*   Iron/TIBC/Ferritin/  %Sat No results found for: IRON, TIBC, FERRITIN, IRONPCTSAT

## 2017-05-09 DIAGNOSIS — B377 Candidal sepsis: Principal | ICD-10-CM

## 2017-05-09 DIAGNOSIS — G061 Intraspinal abscess and granuloma: Secondary | ICD-10-CM

## 2017-05-09 LAB — BASIC METABOLIC PANEL
Anion gap: 16 — ABNORMAL HIGH (ref 5–15)
BUN: 46 mg/dL — AB (ref 6–20)
CALCIUM: 7.2 mg/dL — AB (ref 8.9–10.3)
CO2: 19 mmol/L — ABNORMAL LOW (ref 22–32)
CREATININE: 4.65 mg/dL — AB (ref 0.61–1.24)
Chloride: 98 mmol/L — ABNORMAL LOW (ref 101–111)
GFR calc Af Amer: 16 mL/min — ABNORMAL LOW (ref >60)
GFR, EST NON AFRICAN AMERICAN: 14 mL/min — AB (ref >60)
GLUCOSE: 176 mg/dL — AB (ref 65–99)
Potassium: 4.5 mmol/L (ref 3.5–5.1)
SODIUM: 133 mmol/L — AB (ref 135–145)

## 2017-05-09 LAB — GLUCOSE, CAPILLARY
GLUCOSE-CAPILLARY: 175 mg/dL — AB (ref 65–99)
Glucose-Capillary: 165 mg/dL — ABNORMAL HIGH (ref 65–99)
Glucose-Capillary: 161 mg/dL — ABNORMAL HIGH (ref 65–99)
Glucose-Capillary: 142 mg/dL — ABNORMAL HIGH (ref 65–99)

## 2017-05-09 MED ORDER — GUAIFENESIN ER 600 MG PO TB12
600.0000 mg | ORAL_TABLET | Freq: Two times a day (BID) | ORAL | Status: DC
  Administered 2017-05-09 – 2017-06-10 (×63): 600 mg via ORAL
  Filled 2017-05-09 (×64): qty 1

## 2017-05-09 MED ORDER — BENZONATATE 100 MG PO CAPS
100.0000 mg | ORAL_CAPSULE | Freq: Two times a day (BID) | ORAL | Status: DC | PRN
  Administered 2017-05-09 – 2017-06-09 (×3): 100 mg via ORAL
  Filled 2017-05-09 (×3): qty 1

## 2017-05-09 NOTE — Progress Notes (Signed)
[facility] Progress Note  Subjective: Had HD yest, min UF w/ soft BP's.  Had some UOP yesterday , 500 cc recorded which is an improvmeent.   Vitals:   05/08/17 2030 05/08/17 2326 05/09/17 0423 05/09/17 0734  BP: 135/79 128/87 132/86 (!) 145/87  Pulse: 90 96 88 66  Resp: 18 16 17 17   Temp: 99.2 F (37.3 C) 98.5 F (36.9 C) 98.3 F (36.8 C) 98.6 F (37 C)  TempSrc: Oral Oral Oral Oral  SpO2: 94% 91% 92% 92%  Weight:   115.7 kg (255 lb 1.2 oz)   Height:        Inpatient medications: . amiodarone  200 mg Oral Daily  . dexamethasone  3 mg Oral Q8H  . diltiazem  60 mg Oral Q8H  . feeding supplement (GLUCERNA SHAKE)  237 mL Oral BID BM  . insulin aspart  0-5 Units Subcutaneous QHS  . insulin aspart  0-9 Units Subcutaneous TID WC  . insulin aspart  2 Units Subcutaneous TID WC  . insulin glargine  12 Units Subcutaneous Daily  . metoprolol tartrate  12.5 mg Oral BID  . mirtazapine  15 mg Oral QHS  . pantoprazole  40 mg Oral Daily   . anidulafungin Stopped (05/09/17 0132)  . ceFEPime (MAXIPIME) IV Stopped (05/08/17 1912)  . DAPTOmycin (CUBICIN)  IV Stopped (05/08/17 2103)   acetaminophen, bisacodyl, heparin, labetalol, lidocaine (PF), LORazepam, methocarbamol, ondansetron (ZOFRAN) IV, ondansetron, sodium chloride flush, traZODone  Exam: No distress, lying flat, alert and O x3, pleasant No jvd Chest clear bilat RRR  Abd soft obese no ascites Ext no sig edema Neuro gen'd weakness x 4 ext  UA 04/01/17 > prot 30, tntc rbc/ wbc's, 1.017 UA 04/08/17 > prot neg, 1.010, many bact, tntc rbc/ wbc UA 02-May-2017 > prot 30, 1.015, few bact, 6-30 rbc/ tntc wbc UNa 4/3 > 74 UCr 4/3 > 38 CXR 3/30 - clear CT abd 3/30 > Adrenals/Urinary Tract: Normal adrenal glands. No renal calculi. Minimal bilateral caliectasis is favored to be physiologic in the setting of bladder distension. There is symmetric mild bilateral perirenal edema. Bladder distension, despite Foley catheter in  place  decompressed w foley cath visible  CT chest 3/30 - lungs no edema Renal US 4/3 > 12- 13 cm kidneys, no hydro, bilat ^echo, bladder  Summary:  Pt presented on 03/21/17 w/ progressive UE/LE weakness found to have cervical cord compression C5-6/ 6-7 due to DJD and also had likely demyelinating disease (brain/ spinal cord) suspected to MS.  Rx's with IV steroids + spinal decompression on 2/21 by NSurg. Had postop neurological worsening and had abscess vs hematoma by MRI in the neck causing cord compression. Nonsurgical Rx was recommended by nsurg; afib anticoag was stopped and pt was dc'd to SNF on 3/8 to get IV abx (vanc/ maxipime) through 05/16/17.  Pt presented to ED on 3/30 w abd pain sent from SNF. Creat was 5.6, and CT showed enlarged bladder despite Foley cath in place.  Seen by urology and they dislodged a piece of tissue likely to be blocking foley , new foley placed and was making urine. Pt was admitted. Vanc level (although vanc was stopped already by SNF for ^creat) was 55.  Creat didn't improve despite properly functioning foley cath.  Seen by renal , UOP dropped off and creat ^'d up to 9 and pt was felt to have vanc toxicity AKI and was started on temp HD on 05/07/17.  In meantime also blood cx's grew  candida and the PICC line was removed and pt started on antifungals.    Impression: 1  AKI: ATN d/t vanc toxicity. SP HD x 2 on Fri and Sat, didn't tolerate vol removal on either HD.  Had 500 cc UOP recorded yest, was oliguric prior to that. May be recovering.  Will hold off on HD orders for tomorrow, check labs in am.    2   Urinary retention: isolated issue due to foley blockage, resolved by urology on day of admission   3   Candidemia - PICC line dc'd, on IV antifungal  4   Afib: RVR resolved, on po dilt and metoprolol for this, and po amiodarone. Not getting anticoag as contraindicated   5  HTN: Bp's on lower side, getting dilt / metop for afib rate control  6  DM2: on lantus, SSI  and meal coverage, per primary  7  Volume: no gross excess, not tolerating UF on HD x 2  8   Quadriplegic - Cervical cord compression/ MS, per primary team   Plan - will follow   Vinson Moselle MD [facility] pager [phone]   05/09/2017, 9:58 AM   Recent Labs  Lab 05/04/17 0016 05/04/17 0618  05/07/17 1528 05/08/17 0342 05/09/17 0801  NA 134* 134*   < > 129* 134* 133*  K 3.7 3.7   < > 4.1 4.3 4.5  CL 98* 96*   < > 90* 97* 98*  CO2 18* 22   < > 19* 23 19*  GLUCOSE 245* 248*   < > 202* 138* 176*  BUN 108* 100*   < > 141* 76* 46*  CREATININE 7.32*  7.53* 7.45*   < > 9.61* 6.05* 4.65*  CALCIUM 7.5* 7.5*   < > 7.2* 7.1* 7.2*  PHOS 6.9* 6.6*  --  7.8*  --   --    < > = values in this interval not displayed.   Recent Labs  Lab 05/04/17 0016 05/04/17 0618 05/07/17 1528  ALBUMIN 1.9* 1.9* 1.8*   Recent Labs  Lab 05/07/17 1528  WBC 13.7*  HGB 9.5*  HCT 27.6*  MCV 83.1  PLT 67*   Iron/TIBC/Ferritin/ %Sat No results found for: IRON, TIBC, FERRITIN, IRONPCTSAT

## 2017-05-09 NOTE — Progress Notes (Signed)
PROGRESS NOTE  GRAISON LEINBERGER  ZOX:[id number] DOB: 02-20-1965 DOA: 2017/05/21 PCP: Patient, No Pcp Per   Brief Narrative: PRESTYN STANCO is a 52 y.o. 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 abscess seen on MRI. He was also diagnosed with MS at that time. Neurosurgery felt the risks greatly outweighed benefits of repeat exploratory surgery. He was started on IV antibiotics due to complicated UTI, possible cervical abscess and was discharged to [facility] 3/8 with PICC, plan to continue vancomycin and cefepime until 4/4. Vancomycin was stopped earlier due to increasing creatinine.   Patient presented to ED with abd pain, found to have Cr of 5.68 and concerns for non-draining foley cath. After removing foreign body, cath yielded 2.5L urine. Patient also noted to have K in excess of 6.4, given kayexelate. ID was consulted and recommended change to daptomycin and cefepime. Blood cultures grew C. glabrata, so PICC was removed for line holiday and anidulafungin was started. Neurology was consulted and recommended deferral of MS treatment to the outpatient setting as there is no evidence of an acute flare. Nephrology was consulted with progressive oliguric renal failure. HD cath placed 4/5 and HD started.  Assessment & Plan: Principal Problem:   MDD (major depressive disorder), single episode, moderate (HCC) Active Problems:   Uncontrolled diabetes mellitus type 2 without complications (HCC)   Hypertension   Morbid obesity (HCC)   Paroxysmal atrial fibrillation (HCC)   Multiple sclerosis exacerbation (HCC)   Pressure injury of skin   Hyperkalemia   AKI (acute kidney injury) (HCC)   Hematoma   Insomnia   Adjustment reaction with anxiety and depression   Quadriplegia (HCC)   History of fusion of cervical spine   Postoperative wound infection   Coronary artery disease   Atherosclerotic peripheral vascular disease (HCC)   Candidemia (HCC)  Acute kidney injury: Due to vancomycin toxicity (elevated levels despite discontinuation), obstructive uropathy (obstructed foley at admission), not responsive to IVF.  - Stopped vancomycin - Patient seen by urology with foreign body removed with flush. Continue to monitor output. - Nephrology to coordinating HD 4/5, 4/6. HD catheter placed by IR 4/5. - UOP picking up, hopeful for renal recovery.  Candidemia, technically was septic on arrival which has improved: Candida glabrata in blood cultures.  - On anidulafungin per ID with current end date 4/16 (if repeat cultures remain negative). PICC removed.  - No intraocular involvement on ophtho exam 4/5. - Blood cultures repeated 4/4, NGTD - Echocardiogram performed 4/4 without vegetation.  Resolved Hepatitis B infection vs. false positive Hep core Ab: LFTs wnl - Defer management to ID   Cervical abscess vs. hematoma:  - Continue daptomycin and cefepime with end date 05/16/2017.  Quadriplegia: Related to cervical cord compression (abscess vs. hematoma), no change per patient. - Neurology had no further recommendations at this time. - Neurosurgery felt too high risk for surgery  Cough: Suspect due to atelectasis given his immobility and size.  - Incentive spirometry - Mucinex, prn tessalon - Doubt pneumonia could develop with breadth of current antimicrobial regimen, but could consider CXR if worsening. No edema.  Multiple sclerosis:  - Outpatient management with Dr. Epimenio Foot recommended by neurology (signed off)  IDT2DM: HbA1c 8.4%. At inpatient goal without hypoglycemia - Holding oral medications including metformin.  - Continue lantus (decreased  on admission 70u > 10u, now will increase modestly and continue to monitor) - Continue SSI + 2 units TIDWC  Hyperkalemia:  Resolved prior to HD.  - Monitoring daily labs  Afib: RVR resolved - Continue cardizem, lopressor, amiodarone. - Anticoagulation is contraindicated  Depression - Psychiatry evaluation performed 3/31, will follow recommendations for trazodone. No inpatient management recommended.   DVT prophylaxis: SCDs Code Status: DNR confirmed at admission Family Communication: None at bedside Disposition Plan: Uncertain  Consultants:   Neurology  Infectious disease  Nephrology  Procedures:  Echocardiogram 05/06/2017:  - Left ventricle: The cavity size was normal. Wall thickness was   increased in a pattern of moderate LVH. Indeterminant diastolic   function (atrial fibrillation). The estimated ejection fraction   was 55%. Although no diagnostic regional wall motion abnormality   was identified, this possibility cannot be completely excluded on   the basis of this study. - Aortic valve: There was no stenosis. - Mitral valve: Mildly calcified annulus. There was no significant   regurgitation. - Right ventricle: Poorly visualized. Probably normal size and   systolic function. - Pulmonary arteries: No complete TR doppler jet so unable to   estimate PA systolic pressure. - Inferior vena cava: The vessel was normal in size. The   respirophasic diameter changes were in the normal range (>= 50%),   consistent with normal central venous pressure.  Impressions: - Technically difficult study with poor acoustic windows. Normal LV   size with moderate LV hypertrophy. EF 55%. Poorly visualized RV   but probably normal. No significant valvular abnormalities.  HD 4/5, 4/6  Antimicrobials: - Daptomycin and cefepime 4/1 >>  - Anidulafungin 4/3 >>   Subjective: Tired, having some cough without fever or sputum. Feels throat is irritated. Urine output improved.   Objective: Vitals:   05/08/17 2326 05/09/17 0423 05/09/17 0734 05/09/17 1143  BP: 128/87 132/86 (!) 145/87 (!) 140/94  Pulse:  96 88 66 82  Resp: 16 17 17 18   Temp: 98.5 F (36.9 C) 98.3 F (36.8 C) 98.6 F (37 C) 98.2 F (36.8 C)  TempSrc: Oral Oral Oral Oral  SpO2: 91% 92% 92% 93%  Weight:  115.7 kg (255 lb 1.2 oz)    Height:        Intake/Output Summary (Last 24 hours) at 05/09/2017 1457 Last data filed at 05/09/2017 0700 Gross per 24 hour  Intake 462 ml  Output 500 ml  Net -38 ml   Filed Weights   05/08/17 0715 05/08/17 1052 05/09/17 0423  Weight: 112.4 kg (247 lb 12.8 oz) 111.7 kg (246 lb 4.1 oz) 115.7 kg (255 lb 1.2 oz)    Gen: 52 y.o. male in no distress  Pulm: Non-labored breathing room air. Clear to auscultation bilaterally.  CV: Regular rate and rhythm. No murmur, rub, or gallop. No edema or JVD. GI: Abdomen soft, non-tender, non-distended, with normoactive bowel sounds. No organomegaly or masses felt. GU: +Foley Ext: Warm, no deformities Skin: No rashes, lesions or ulcers Neuro: Alert and oriented. Has 1/5 strength in shoulders, elbow, otherwise flaccid quadriplegia. Psych: Judgement and insight appear normal. Mood depressed, flat affect.  Data Reviewed: I have personally reviewed following labs and imaging studies  CBC: Recent Labs  Lab 05/07/17 1528  WBC 13.7*  HGB 9.5*  HCT 27.6*  MCV 83.1  PLT 67*   Basic Metabolic Panel: Recent Labs  Lab 05/03/17 1331 05/03/17 1930 05/04/17 0016 05/04/17 0618 05/05/17 0420 05/06/17 1003 05/07/17 1528 05/08/17 0342 05/09/17 0801  NA  137 134* 134* 134* 134* 132* 129* 134* 133*  K 4.1 3.7 3.7 3.7 3.4* 3.8 4.1 4.3 4.5  CL 101 98* 98* 96* 93* 92* 90* 97* 98*  CO2 19* 20* 18* 22 22 20* 19* 23 19*  GLUCOSE 238* 259* 245* 248* 215* 164* 202* 138* 176*  BUN 108* 89* 108* 100* 97* 125* 141* 76* 46*  CREATININE 7.00* 7.08* 7.32*  7.53* 7.45* 7.83* 8.99* 9.61* 6.05* 4.65*  CALCIUM 7.7* 7.4* 7.5* 7.5* 7.4* 7.4* 7.2* 7.1* 7.2*  MG  --   --   --  1.8  --   --   --   --   --   PHOS 7.2* 6.9* 6.9* 6.6*  --   --  7.8*  --   --     GFR: Estimated Creatinine Clearance: 26.7 mL/min (A) (by C-G formula based on SCr of 4.65 mg/dL (H)). Liver Function Tests: Recent Labs  Lab 05/03/17 1331 05/03/17 1930 05/04/17 0016 05/04/17 0618 05/07/17 1528  ALBUMIN 2.0* 2.0* 1.9* 1.9* 1.8*   No results for input(s): LIPASE, AMYLASE in the last 168 hours. No results for input(s): AMMONIA in the last 168 hours. Coagulation Profile: No results for input(s): INR, PROTIME in the last 168 hours. Cardiac Enzymes: Recent Labs  Lab 05/07/17 0538  CKTOTAL 176   BNP (last 3 results) No results for input(s): PROBNP in the last 8760 hours. HbA1C: No results for input(s): HGBA1C in the last 72 hours. CBG: Recent Labs  Lab 05/08/17 1323 05/08/17 1646 05/08/17 2124 05/09/17 0610 05/09/17 1141  GLUCAP 112* 118* 133* 175* 165*   Lipid Profile: No results for input(s): CHOL, HDL, LDLCALC, TRIG, CHOLHDL, LDLDIRECT in the last 72 hours. Thyroid Function Tests: No results for input(s): TSH, T4TOTAL, FREET4, T3FREE, THYROIDAB in the last 72 hours. Anemia Panel: No results for input(s): VITAMINB12, FOLATE, FERRITIN, TIBC, IRON, RETICCTPCT in the last 72 hours. Urine analysis:    Component Value Date/Time   COLORURINE YELLOW 04/18/2017 0920   APPEARANCEUR TURBID (A) 04/17/2017 0920   LABSPEC 1.015 04/25/2017 0920   PHURINE 6.0 04/30/2017 0920   GLUCOSEU NEGATIVE 04/08/2017 0920   HGBUR LARGE (A) 04/30/2017 0920   BILIRUBINUR NEGATIVE 04/26/2017 0920   KETONESUR NEGATIVE 04/19/2017 0920   PROTEINUR 30 (A) 04/09/2017 0920   NITRITE NEGATIVE 04/13/2017 0920   LEUKOCYTESUR MODERATE (A) 04/21/2017 0920   Recent Results (from the past 240 hour(s))  Urine culture     Status: None   Collection Time: 04/28/2017  9:21 AM  Result Value Ref Range Status   Specimen Description   Final    URINE, RANDOM Performed at [facility], [address], [place], [place] [address]    Special Requests   Final     NONE Performed at [facility], [address] [place], [place] [address]    Culture   Final    NO GROWTH Performed at [facility], [address] [place], [place] [address]    Report Status 05/02/2017 FINAL  Final  Blood culture (routine x 2)     Status: Abnormal   Collection Time: 04/18/2017  9:25 AM  Result Value Ref Range Status   Specimen Description   Final    BLOOD PICC LINE Performed at [facility], [address], [place], [place] [address]    Special Requests   Final    BOTTLES DRAWN AEROBIC AND ANAEROBIC Blood Culture adequate volume Performed at [facility],  [address] [place], [place] [address]    Culture  Setup Time   Final    YEAST ANAEROBIC BOTTLE ONLY CRITICAL RESULT CALLED TO, READ BACK BY AND VERIFIED WITHEarlean Shawl [facility] 0981 05/04/17 A BROWNING Performed at [facility], [address], [place], [place] [address]    Culture CANDIDA GLABRATA (A)  Final   Report Status 05/06/2017 FINAL  Final  Blood Culture ID Panel (Reflexed)     Status: Abnormal   Collection Time: 04/30/2017  9:25 AM  Result Value Ref Range Status   Enterococcus species NOT DETECTED NOT DETECTED Final   Listeria monocytogenes NOT DETECTED NOT DETECTED Final   Staphylococcus species NOT DETECTED NOT DETECTED Final   Staphylococcus aureus NOT DETECTED NOT DETECTED Final   Streptococcus species NOT DETECTED NOT DETECTED Final   Streptococcus agalactiae NOT DETECTED NOT DETECTED Final   Streptococcus pneumoniae NOT DETECTED NOT DETECTED Final   Streptococcus pyogenes NOT DETECTED NOT DETECTED Final   Acinetobacter baumannii NOT DETECTED NOT DETECTED Final   Enterobacteriaceae species NOT DETECTED NOT DETECTED Final   Enterobacter cloacae complex NOT DETECTED NOT DETECTED Final   Escherichia coli NOT DETECTED NOT DETECTED Final   Klebsiella oxytoca NOT DETECTED NOT DETECTED Final   Klebsiella pneumoniae NOT DETECTED  NOT DETECTED Final   Proteus species NOT DETECTED NOT DETECTED Final   Serratia marcescens NOT DETECTED NOT DETECTED Final   Haemophilus influenzae NOT DETECTED NOT DETECTED Final   Neisseria meningitidis NOT DETECTED NOT DETECTED Final   Pseudomonas aeruginosa NOT DETECTED NOT DETECTED Final   Candida albicans NOT DETECTED NOT DETECTED Final   Candida glabrata DETECTED (A) NOT DETECTED Final    Comment: CRITICAL RESULT CALLED TO, READ BACK BY AND VERIFIED WITH: J GADHIA PHARMD 1829 05/04/17 A BROWNING    Candida krusei NOT DETECTED NOT DETECTED Final   Candida parapsilosis NOT DETECTED NOT DETECTED Final   Candida tropicalis NOT DETECTED NOT DETECTED Final    Comment: Performed at [facility], [address], [place], [place] [address]  Culture, blood (routine x 2)     Status: None (Preliminary result)   Collection Time: 05/06/17  2:33 PM  Result Value Ref Range Status   Specimen Description BLOOD LEFT HAND  Final   Special Requests   Final    BOTTLES DRAWN AEROBIC ONLY Blood Culture adequate volume   Culture   Final    NO GROWTH 2 DAYS Performed at [facility], [address] [place], [place] [address]    Report Status PENDING  Incomplete  Culture, blood (routine x 2)     Status: None (Preliminary result)   Collection Time: 05/06/17  2:40 PM  Result Value Ref Range Status   Specimen Description BLOOD LEFT WRIST  Final   Special Requests   Final    BOTTLES DRAWN AEROBIC ONLY Blood Culture adequate volume   Culture   Final    NO GROWTH 2 DAYS Performed at [facility], [address], [place], [place] [address]    Report Status PENDING  Incomplete      Radiology Studies: No results found.  Scheduled Meds: . amiodarone  200 mg Oral Daily  . dexamethasone  3 mg Oral Q8H  . diltiazem  60 mg Oral Q8H  . feeding supplement (GLUCERNA SHAKE)  237 mL Oral BID BM  . insulin aspart  0-5 Units Subcutaneous QHS  . insulin aspart  0-9 Units Subcutaneous TID WC   .  insulin aspart  2 Units Subcutaneous TID WC  . insulin glargine  12 Units Subcutaneous Daily  . metoprolol tartrate  12.5 mg Oral BID  . mirtazapine  15 mg Oral QHS  . pantoprazole  40 mg Oral Daily   Continuous Infusions: . anidulafungin Stopped (05/09/17 0132)  . ceFEPime (MAXIPIME) IV Stopped (05/08/17 1912)  . DAPTOmycin (CUBICIN)  IV Stopped (05/08/17 2103)     LOS: 8 days   Time spent: 25 minutes.  Hazeline Junker, MD [facility] Pager [phone]  If 7PM-7AM, please contact night-coverage [email or web address] Password [email or web address] 05/09/2017, 2:57 PM

## 2017-05-10 ENCOUNTER — Inpatient Hospital Stay: Payer: Self-pay | Admitting: Infectious Diseases

## 2017-05-10 LAB — GLUCOSE, CAPILLARY
GLUCOSE-CAPILLARY: 176 mg/dL — AB (ref 65–99)
Glucose-Capillary: 157 mg/dL — ABNORMAL HIGH (ref 65–99)
Glucose-Capillary: 150 mg/dL — ABNORMAL HIGH (ref 65–99)

## 2017-05-10 LAB — BASIC METABOLIC PANEL
ANION GAP: 14 (ref 5–15)
BUN: 62 mg/dL — AB (ref 6–20)
CO2: 20 mmol/L — ABNORMAL LOW (ref 22–32)
Calcium: 7.4 mg/dL — ABNORMAL LOW (ref 8.9–10.3)
Chloride: 98 mmol/L — ABNORMAL LOW (ref 101–111)
Creatinine, Ser: 5.32 mg/dL — ABNORMAL HIGH (ref 0.61–1.24)
GFR calc Af Amer: 13 mL/min — ABNORMAL LOW (ref >60)
GFR, EST NON AFRICAN AMERICAN: 11 mL/min — AB (ref >60)
Glucose, Bld: 169 mg/dL — ABNORMAL HIGH (ref 65–99)
POTASSIUM: 5.5 mmol/L — AB (ref 3.5–5.1)
SODIUM: 132 mmol/L — AB (ref 135–145)

## 2017-05-10 MED ORDER — LIDOCAINE-PRILOCAINE 2.5-2.5 % EX CREA: 1.0000 "application " | TOPICAL_CREAM | CUTANEOUS | Status: DC | PRN

## 2017-05-10 MED ORDER — HEPARIN SODIUM (PORCINE) 1000 UNIT/ML DIALYSIS: 1000.0000 [IU] | INTRAMUSCULAR | Status: DC | PRN

## 2017-05-10 MED ORDER — SODIUM CHLORIDE 0.9 % IV SOLN: 100.0000 mL | INTRAVENOUS | Status: DC | PRN

## 2017-05-10 MED ORDER — ALTEPLASE 2 MG IJ SOLR
2.0000 mg | Freq: Once | INTRAMUSCULAR | Status: DC | PRN
Start: 2017-05-10 — End: 2017-05-14

## 2017-05-10 MED ORDER — HEPARIN SODIUM (PORCINE) 1000 UNIT/ML DIALYSIS: 5000.0000 [IU] | INTRAMUSCULAR | Status: DC | PRN

## 2017-05-10 MED ORDER — LIDOCAINE HCL (PF) 1 % IJ SOLN: 5.0000 mL | INTRAMUSCULAR | Status: DC | PRN

## 2017-05-10 MED ORDER — PENTAFLUOROPROP-TETRAFLUOROETH EX AERO: 1.0000 "application " | INHALATION_SPRAY | CUTANEOUS | Status: DC | PRN

## 2017-05-10 NOTE — Progress Notes (Signed)
Patient ID: Dylan [name], male   DOB: 07-01-1965, 52 y.o.   MRN: [id number] [facility] Progress Note   Assessment/ Plan:   1. Acute kidney injury: Suspected secondary to nephrotoxic ATN from supratherapeutic vancomycin levels.  Urine output recorded as anuric.  With rising creatinine and worsening hyperkalemia, plan to undertake hemodialysis again today as we continue expectant monitoring for renal recovery.  He is hypervolemic and had problems at his last dialysis with efforts at ultrafiltration because of hypotension-this will be reattempted again today. 2. Atrial fibrillation: Initially with rapid ventricular response that is now rate controlled on diltiazem/metoprolol and amiodarone. 3. Candidemia: On Anidulafungin as recommended by infectious disease-stop date of 4/16 based on repeat cultures. 4.  Cervical abscess versus hematoma: On empiric intravenous daptomycin and cefepime 5.  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  Urinary retention: Secondary to blockage of Foley catheter-underwent exchange by urology.  Subjective:   Reports to be feeling fair-denies any chest pain or shortness of breath   Objective:   BP 116/69 (BP Location: Left Arm)   Pulse 82   Temp 99 F (37.2 C) (Oral)   Resp 18   Ht 6\' 4"  (1.93 m)   Wt 116.5 kg (256 lb 13.4 oz)   SpO2 95%   BMI 31.26 kg/m   Intake/Output Summary (Last 24 hours) at 05/10/2017 1007 Last data filed at 05/10/2017 0000 Gross per 24 hour  Intake 1042 ml  Output -  Net 1042 ml   Weight change: 4.1 kg (9 lb 0.6 oz)  Physical Exam: Gen: Appears to be comfortable resting in bed, watching television CVS: Pulse regular rhythm, normal rate, S1 and S2 normal Resp: Coarse breath sounds bilaterally-no distinct rales or rhonchi Abd: Soft, obese, nontender Ext: Trace ankle edema  Imaging: No results found.  Labs: BMET Recent Labs  Lab 05/03/17 1331 05/03/17 1930  05/04/17 0016 05/04/17 0618 05/05/17 0420 05/06/17 1003 05/07/17 1528 05/08/17 0342 05/09/17 0801 05/10/17 0241  NA 137 134* 134* 134* 134* 132* 129* 134* 133* 132*  K 4.1 3.7 3.7 3.7 3.4* 3.8 4.1 4.3 4.5 5.5*  CL 101 98* 98* 96* 93* 92* 90* 97* 98* 98*  CO2 19* 20* 18* 22 22 20* 19* 23 19* 20*  GLUCOSE 238* 259* 245* 248* 215* 164* 202* 138* 176* 169*  BUN 108* 89* 108* 100* 97* 125* 141* 76* 46* 62*  CREATININE 7.00* 7.08* 7.32*  7.53* 7.45* 7.83* 8.99* 9.61* 6.05* 4.65* 5.32*  CALCIUM 7.7* 7.4* 7.5* 7.5* 7.4* 7.4* 7.2* 7.1* 7.2* 7.4*  PHOS 7.2* 6.9* 6.9* 6.6*  --   --  7.8*  --   --   --    CBC Recent Labs  Lab 05/07/17 1528  WBC 13.7*  HGB 9.5*  HCT 27.6*  MCV 83.1  PLT 67*    Medications:    . amiodarone  200 mg Oral Daily  . dexamethasone  3 mg Oral Q8H  . diltiazem  60 mg Oral Q8H  . feeding supplement (GLUCERNA SHAKE)  237 mL Oral BID BM  . guaiFENesin  600 mg Oral BID  . insulin aspart  0-5 Units Subcutaneous QHS  . insulin aspart  0-9 Units Subcutaneous TID WC  . insulin aspart  2 Units Subcutaneous TID WC  . insulin glargine  12 Units Subcutaneous Daily  . metoprolol tartrate  12.5 mg Oral BID  . mirtazapine  15 mg Oral QHS  . pantoprazole  40 mg Oral Daily  Zetta Bills, MD 05/10/2017, 10:07 AM

## 2017-05-10 NOTE — Procedures (Signed)
Patient seen on Hemodialysis. QB 400, UF goal 2L Treatment adjusted as needed.  Zetta Bills MD [facility]. Office # [phone] Pager # [phone] 2:49 PM

## 2017-05-10 NOTE — Progress Notes (Signed)
Patient off unit in Dialysis most of shift, patient assessed, wound consult made, medications given w/o difficulty.

## 2017-05-10 NOTE — Progress Notes (Signed)
PROGRESS NOTE  Dylan [name]  ZOX:[id number] DOB: 1965-11-10 DOA: 05-20-17 PCP: Patient, No Pcp Per   Brief Narrative: Dylan [name] is a 52 y.o. 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 abscess seen on MRI. He was also diagnosed with MS at that time. Neurosurgery felt the risks greatly outweighed benefits of repeat exploratory surgery. He was started on IV antibiotics due to complicated UTI, possible cervical abscess and was discharged to [facility] 3/8 with PICC, plan to continue vancomycin and cefepime until 4/4. Vancomycin was stopped earlier due to increasing creatinine.   Patient presented to ED with abd pain, found to have Cr of 5.68 and concerns for non-draining foley cath. After removing foreign body, cath yielded 2.5L urine. Patient also noted to have K in excess of 6.4, given kayexelate. ID was consulted and recommended change to daptomycin and cefepime. Blood cultures grew C. glabrata, so PICC was removed for line holiday and anidulafungin was started. Neurology was consulted and recommended deferral of MS treatment to the outpatient setting as there is no evidence of an acute flare. Nephrology was consulted with progressive oliguric renal failure. HD cath placed 4/5 and HD started.  Assessment & Plan: Principal Problem:   MDD (major depressive disorder), single episode, moderate (HCC) Active Problems:   Uncontrolled diabetes mellitus type 2 without complications (HCC)   Hypertension   Morbid obesity (HCC)   Paroxysmal atrial fibrillation (HCC)   Multiple sclerosis exacerbation (HCC)   Pressure injury of skin   Hyperkalemia   AKI (acute kidney injury) (HCC)   Hematoma   Insomnia   Adjustment reaction with anxiety and depression   Quadriplegia (HCC)   History of fusion of cervical spine   Postoperative wound infection   Coronary artery disease   Atherosclerotic peripheral vascular disease (HCC)   Candidemia (HCC)  Acute kidney injury: Due to vancomycin toxicity (elevated levels despite discontinuation), obstructive uropathy (obstructed foley at admission), not responsive to IVF.  - Stopped vancomycin - Patient seen by urology with foreign body removed with flush. Continue to monitor output. - Nephrology to coordinating HD 4/5, 4/6. HD catheter placed by IR 4/5. - Needs to repeat HD today, K up. Telemetry stable.   Candidemia, technically was septic on arrival which has improved: Candida glabrata in blood cultures.  - On anidulafungin per ID with current end date 4/16 (if repeat cultures remain negative). PICC removed.  - No intraocular involvement on ophtho exam 4/5. - Blood cultures repeated 4/4, NGTD - Echocardiogram performed 4/4 without vegetation.  Resolved Hepatitis B infection vs. false positive Hep core Ab: LFTs wnl - Defer management to ID   Cervical abscess vs. hematoma:  - Continue daptomycin and cefepime with end date 05/16/2017.  Quadriplegia: Related to cervical cord compression (abscess vs. hematoma), no change per patient. - Neurology had no further recommendations at this time. - Neurosurgery felt too high risk for surgery  Cough: Suspect due to atelectasis given his immobility and size.  Doubt pneumonia could develop with breadth of current antimicrobial regimen, but could consider CXR if worsening. No edema. - Incentive spirometry - Mucinex, prn tessalon  Multiple sclerosis:  - Outpatient management with Dr. Epimenio Foot recommended by neurology (signed off)  IDT2DM: HbA1c 8.4%. At inpatient goal without hypoglycemia - Holding oral medications including metformin.  - Continue  lantus (decreased on admission 70u > 10u, now will increase modestly and continue to monitor) - Continue SSI + 2 units  TIDWC  Hyperkalemia: Resolved prior to HD.  - Monitoring daily labs  Afib: RVR resolved - Continue cardizem, lopressor, amiodarone. - Anticoagulation is contraindicated  Depression - Psychiatry evaluation performed 3/31, will follow recommendations for trazodone. No inpatient management recommended.   DVT prophylaxis: SCDs Code Status: DNR confirmed at admission Family Communication: None at bedside Disposition Plan: Likely SNF, still not certain about renal prognosis  Consultants:   Neurology  Infectious disease  Nephrology  Procedures:  Echocardiogram 05/06/2017:  - Left ventricle: The cavity size was normal. Wall thickness was   increased in a pattern of moderate LVH. Indeterminant diastolic   function (atrial fibrillation). The estimated ejection fraction   was 55%. Although no diagnostic regional wall motion abnormality   was identified, this possibility cannot be completely excluded on   the basis of this study. - Aortic valve: There was no stenosis. - Mitral valve: Mildly calcified annulus. There was no significant   regurgitation. - Right ventricle: Poorly visualized. Probably normal size and   systolic function. - Pulmonary arteries: No complete TR doppler jet so unable to   estimate PA systolic pressure. - Inferior vena cava: The vessel was normal in size. The   respirophasic diameter changes were in the normal range (>= 50%),   consistent with normal central venous pressure.  Impressions: - Technically difficult study with poor acoustic windows. Normal LV   size with moderate LV hypertrophy. EF 55%. Poorly visualized RV   but probably normal. No significant valvular abnormalities.  HD 4/5, 4/6  Antimicrobials: - Daptomycin and cefepime 4/1 >>  - Anidulafungin 4/3 >>   Subjective: Not a lot of UOP. No complaints.   Objective: Vitals:   05/10/17 1220 05/10/17 1302 05/10/17 1330 05/10/17 1400  BP: 129/76 (!) 156/81 (!) 153/86 135/90  Pulse: 78  81 98 86  Resp: 18 20 19  (!) 23  Temp: 98.7 F (37.1 C) 98.2 F (36.8 C)    TempSrc: Oral Oral    SpO2: 95% 93%    Weight:  111 kg (244 lb 11.4 oz)    Height:        Intake/Output Summary (Last 24 hours) at 05/10/2017 1602 Last data filed at 05/10/2017 1122 Gross per 24 hour  Intake 742 ml  Output 50 ml  Net 692 ml   Filed Weights   05/09/17 0423 05/10/17 0500 05/10/17 1302  Weight: 115.7 kg (255 lb 1.2 oz) 116.5 kg (256 lb 13.4 oz) 111 kg (244 lb 11.4 oz)    Gen: 52 y.o. male in no distress  Pulm: Non-labored breathing room air. Clear to auscultation bilaterally.  CV: Regular rate and rhythm. No murmur, rub, or gallop. No edema or JVD. GI: Abdomen soft, non-tender, non-distended, with normoactive bowel sounds. No organomegaly or masses felt. GU: +Foley Ext: Warm, no deformities Skin: No rashes, lesions or ulcers Neuro: Alert and oriented. Has near zero / 5 strength in shoulders, elbow but minimal movement, otherwise flaccid quadriplegia. Psych: Judgement and insight appear normal. Mood depressed, flat affect.  Data Reviewed: I have personally reviewed following labs and imaging studies  CBC: Recent Labs  Lab 05/07/17 1528  WBC 13.7*  HGB 9.5*  HCT 27.6*  MCV 83.1  PLT 67*   Basic Metabolic Panel: Recent Labs  Lab 05/03/17 1930 05/04/17 0016 05/04/17 0618  05/06/17 1003 05/07/17 1528 05/08/17 0342 05/09/17 0801 05/10/17 0241  NA 134* 134* 134*   < > 132* 129* 134* 133* 132*  K 3.7 3.7 3.7   < > 3.8 4.1 4.3 4.5 5.5*  CL 98* 98* 96*   < > 92* 90* 97* 98* 98*  CO2 20* 18* 22   < > 20* 19* 23 19* 20*  GLUCOSE 259* 245* 248*   < > 164* 202* 138* 176* 169*  BUN 89* 108* 100*   < > 125* 141* 76* 46* 62*  CREATININE 7.08* 7.32*  7.53* 7.45*   < > 8.99* 9.61* 6.05* 4.65* 5.32*  CALCIUM 7.4* 7.5* 7.5*   < > 7.4* 7.2* 7.1* 7.2* 7.4*  MG  --   --  1.8  --   --   --   --   --   --   PHOS 6.9* 6.9* 6.6*  --   --  7.8*  --   --   --    < > = values in this interval not  displayed.   GFR: Estimated Creatinine Clearance: 22.9 mL/min (A) (by C-G formula based on SCr of 5.32 mg/dL (H)). Liver Function Tests: Recent Labs  Lab 05/03/17 1930 05/04/17 0016 05/04/17 0618 05/07/17 1528  ALBUMIN 2.0* 1.9* 1.9* 1.8*   No results for input(s): LIPASE, AMYLASE in the last 168 hours. No results for input(s): AMMONIA in the last 168 hours. Coagulation Profile: No results for input(s): INR, PROTIME in the last 168 hours. Cardiac Enzymes: Recent Labs  Lab 05/07/17 0538  CKTOTAL 176   BNP (last 3 results) No results for input(s): PROBNP in the last 8760 hours. HbA1C: No results for input(s): HGBA1C in the last 72 hours. CBG: Recent Labs  Lab 05/09/17 0610 05/09/17 1141 05/09/17 1556 05/09/17 2148 05/10/17 0652  GLUCAP 175* 165* 161* 142* 176*   Lipid Profile: No results for input(s): CHOL, HDL, LDLCALC, TRIG, CHOLHDL, LDLDIRECT in the last 72 hours. Thyroid Function Tests: No results for input(s): TSH, T4TOTAL, FREET4, T3FREE, THYROIDAB in the last 72 hours. Anemia Panel: No results for input(s): VITAMINB12, FOLATE, FERRITIN, TIBC, IRON, RETICCTPCT in the last 72 hours. Urine analysis:    Component Value Date/Time   COLORURINE YELLOW 04/14/2017 0920   APPEARANCEUR TURBID (A) 04/10/2017 0920   LABSPEC 1.015 04/25/2017 0920   PHURINE 6.0 04/19/2017 0920   GLUCOSEU NEGATIVE 04/29/2017 0920   HGBUR LARGE (A) 04/17/2017 0920   BILIRUBINUR NEGATIVE 04/14/2017 0920   KETONESUR NEGATIVE 04/17/2017 0920   PROTEINUR 30 (A) 04/19/2017 0920   NITRITE NEGATIVE 04/23/2017 0920   LEUKOCYTESUR MODERATE (A) 04/20/2017 0920   Recent Results (from the past 240 hour(s))  Urine culture     Status: None   Collection Time: 04/20/2017  9:21 AM  Result Value Ref Range Status   Specimen Description   Final    URINE, RANDOM Performed at [facility] [facility], [address], [place], [place] [address]    Special Requests   Final    NONE Performed at  [facility], [address], [place], [place] [address]    Culture   Final    NO GROWTH Performed at [facility], [address], [place], [place] [address]    Report Status 05/02/2017 FINAL  Final  Blood culture (routine x 2)     Status: Abnormal   Collection Time: 04/12/2017  9:25 AM  Result Value Ref Range Status   Specimen Description   Final    BLOOD PICC LINE Performed at [facility], [address] [place], [place] [address]    Special Requests   Final    BOTTLES DRAWN AEROBIC AND ANAEROBIC Blood Culture adequate volume Performed at [facility], [address] [place], [place] [address]    Culture  Setup Time   Final    YEAST ANAEROBIC BOTTLE ONLY CRITICAL RESULT CALLED TO, READ BACK BY AND VERIFIED WITHEarlean [facility] 4128 05/04/17 A BROWNING Performed at [facility], [address] [place], [place] [address]    Culture CANDIDA GLABRATA (A)  Final   Report Status 05/06/2017 FINAL  Final  Blood Culture ID Panel (Reflexed)     Status: Abnormal   Collection Time: 04/30/2017  9:25 AM  Result Value Ref Range Status   Enterococcus species NOT DETECTED NOT DETECTED Final   Listeria monocytogenes NOT DETECTED NOT DETECTED Final   Staphylococcus species NOT DETECTED NOT DETECTED Final   Staphylococcus aureus NOT DETECTED NOT DETECTED Final   Streptococcus species NOT DETECTED NOT DETECTED Final   Streptococcus agalactiae NOT DETECTED NOT DETECTED Final   Streptococcus pneumoniae NOT DETECTED NOT DETECTED Final   Streptococcus pyogenes NOT DETECTED NOT DETECTED Final   Acinetobacter baumannii NOT DETECTED NOT DETECTED Final   Enterobacteriaceae species NOT DETECTED NOT DETECTED Final   Enterobacter cloacae complex NOT DETECTED NOT DETECTED Final   Escherichia coli NOT DETECTED NOT DETECTED Final   Klebsiella oxytoca NOT DETECTED NOT DETECTED Final   Klebsiella pneumoniae NOT DETECTED NOT DETECTED Final    Proteus species NOT DETECTED NOT DETECTED Final   Serratia marcescens NOT DETECTED NOT DETECTED Final   Haemophilus influenzae NOT DETECTED NOT DETECTED Final   Neisseria meningitidis NOT DETECTED NOT DETECTED Final   Pseudomonas aeruginosa NOT DETECTED NOT DETECTED Final   Candida albicans NOT DETECTED NOT DETECTED Final   Candida glabrata DETECTED (A) NOT DETECTED Final    Comment: CRITICAL RESULT CALLED TO, READ BACK BY AND VERIFIED WITH: J GADHIA PHARMD 1829 05/04/17 A BROWNING    Candida krusei NOT DETECTED NOT DETECTED Final   Candida parapsilosis NOT DETECTED NOT DETECTED Final   Candida tropicalis NOT DETECTED NOT DETECTED Final    Comment: Performed at [facility], [address] [place], [place] [address]  Culture, blood (routine x 2)     Status: None (Preliminary result)   Collection Time: 05/06/17  2:33 PM  Result Value Ref Range Status   Specimen Description BLOOD LEFT HAND  Final   Special Requests   Final    BOTTLES DRAWN AEROBIC ONLY Blood Culture adequate volume   Culture   Final    NO GROWTH 4 DAYS Performed at [facility], [address], [place], [place] [address]    Report Status PENDING  Incomplete  Culture, blood (routine x 2)     Status: None (Preliminary result)   Collection Time: 05/06/17  2:40 PM  Result Value Ref Range Status   Specimen Description BLOOD LEFT WRIST  Final   Special Requests   Final    BOTTLES DRAWN AEROBIC ONLY Blood Culture adequate volume   Culture   Final    NO GROWTH 4 DAYS Performed at [facility], [address] [place], [place] [address]    Report Status PENDING  Incomplete      Radiology Studies: No results found.  Scheduled Meds: . amiodarone  200 mg Oral Daily  . dexamethasone  3 mg Oral Q8H  . diltiazem  60  mg Oral Q8H  . feeding supplement (GLUCERNA SHAKE)  237 mL Oral BID BM  . guaiFENesin  600 mg Oral BID  . insulin aspart  0-5 Units Subcutaneous QHS  . insulin aspart  0-9 Units  Subcutaneous TID WC  . insulin aspart  2 Units Subcutaneous TID WC  . insulin glargine  12 Units Subcutaneous Daily  . metoprolol tartrate  12.5 mg Oral BID  . mirtazapine  15 mg Oral QHS  . pantoprazole  40 mg Oral Daily   Continuous Infusions: . anidulafungin Stopped (05/10/17 0004)  . ceFEPime (MAXIPIME) IV Stopped (05/09/17 1633)  . DAPTOmycin (CUBICIN)  IV Stopped (05/08/17 2103)     LOS: 9 days   Time spent: 25 minutes.  Hazeline Junker, MD [facility] Pager [phone]  If 7PM-7AM, please contact night-coverage [email or web address] Password [email or web address] 05/10/2017, 4:02 PM

## 2017-05-11 LAB — GLUCOSE, CAPILLARY
GLUCOSE-CAPILLARY: 159 mg/dL — AB (ref 65–99)
GLUCOSE-CAPILLARY: 260 mg/dL — AB (ref 65–99)
GLUCOSE-CAPILLARY: 267 mg/dL — AB (ref 65–99)
GLUCOSE-CAPILLARY: 238 mg/dL — AB (ref 65–99)
Glucose-Capillary: 226 mg/dL — ABNORMAL HIGH (ref 65–99)

## 2017-05-11 LAB — BASIC METABOLIC PANEL
ANION GAP: 12 (ref 5–15)
BUN: 35 mg/dL — AB (ref 6–20)
CHLORIDE: 100 mmol/L — AB (ref 101–111)
CO2: 21 mmol/L — ABNORMAL LOW (ref 22–32)
Calcium: 7.4 mg/dL — ABNORMAL LOW (ref 8.9–10.3)
Creatinine, Ser: 3.46 mg/dL — ABNORMAL HIGH (ref 0.61–1.24)
GFR calc Af Amer: 22 mL/min — ABNORMAL LOW (ref >60)
GFR, EST NON AFRICAN AMERICAN: 19 mL/min — AB (ref >60)
Glucose, Bld: 159 mg/dL — ABNORMAL HIGH (ref 65–99)
POTASSIUM: 4.1 mmol/L (ref 3.5–5.1)
SODIUM: 133 mmol/L — AB (ref 135–145)

## 2017-05-11 LAB — CULTURE, BLOOD (ROUTINE X 2)
CULTURE: NO GROWTH
Culture: NO GROWTH
SPECIAL REQUESTS: ADEQUATE
SPECIAL REQUESTS: ADEQUATE

## 2017-05-11 LAB — CK: CK TOTAL: 97 U/L (ref 49–397)

## 2017-05-11 MED ORDER — COLLAGENASE 250 UNIT/GM EX OINT
TOPICAL_OINTMENT | Freq: Every day | CUTANEOUS | Status: DC
Start: 2017-05-11 — End: 2017-06-12
  Administered 2017-05-11 – 2017-06-10 (×30): via TOPICAL
  Filled 2017-05-11 (×7): qty 30

## 2017-05-11 NOTE — Care Management Note (Signed)
Case Management Note  Patient Details  Name: Dylan [name] MRN: [id number] Date of Birth: 10/03/1965  Subjective/Objective:                    Action/Plan: Plan is Pt will return to [facility] when medically stable. CM following.  Expected Discharge Date:                  Expected Discharge Plan:  Skilled Nursing Facility  In-House Referral:  Clinical Social Work  Discharge planning Services     Post Acute Care Choice:    Choice offered to:     DME Arranged:    DME Agency:     HH Arranged:    HH Agency:     Status of Service:  In process, will continue to follow  If discussed at Long Length of Stay Meetings, dates discussed:    Additional Comments:  Kermit Balo, RN 05/11/2017, 11:14 AM

## 2017-05-11 NOTE — Progress Notes (Signed)
Nutrition Follow-up  DOCUMENTATION CODES:   Obesity unspecified  INTERVENTION:  Continue Glucerna Shake po TID, each supplement provides 220 kcal and 10 grams of protein  NUTRITION DIAGNOSIS:   Inadequate oral intake related to poor appetite as evidenced by per patient/family report. -ongoing  GOAL:   Patient will meet greater than or equal to 90% of their needs -unmet  MONITOR:   PO intake, Supplement acceptance, Weight trends, I & O's, Labs  REASON FOR ASSESSMENT:   Low Braden    ASSESSMENT:   Pt with PMH of HTN, DM, MS, recent quadriplegia, s/p decompression spinal surgery on C5, C6 and C7 (03/25/17) developing multiple issues post op including new Afib and prevertebral hematoma at surgical sight. Pt presents with abdominal pain found AKI  Had eggs and oatmeal for breakfast. No documented PO, he says he ate ok. Also had open glucerna that he seemed to have taken a few sips of.  HD cath placed 4/5, had PRN HD 4/5, 4/6, 4/8. Not a good candidate for outpatient longterm HD with quadriplegia per MD.  Weight down ~6 pounds since admit. Completed NFPE again, no change currently in setting of quadriplegia. Meal completion 10-50%  Labs reviewed:  CBGs 226, 159 Na 133, Phos 7.8 (4/5) Medications reviewed and include:  Decadron, Insulin, Remeron  Diet Order:  Diet renal with fluid restriction Fluid restriction: 1200 mL Fluid; Room service appropriate? Yes; Fluid consistency: Thin  EDUCATION NEEDS:   Not appropriate for education at this time  Skin:  Unstageable to buttocks  Last BM:  05/09/2017  Height:   Ht Readings from Last 1 Encounters:  05/05/17 6\' 4"  (1.93 m)    Weight:   Wt Readings from Last 1 Encounters:  05/10/17 240 lb 4.8 oz (109 kg)    Ideal Body Weight:  91.8 kg  BMI:  Body mass index is 29.25 kg/m.  Estimated Nutritional Needs:   Kcal:  2200-2400  Protein:  120-135 grams  Fluid:  >/= 2.2 L/d  Dionne Ano. Bethzy Hauck, MS, RD  LDN Inpatient Clinical Dietitian Pager [phone]

## 2017-05-11 NOTE — Consult Note (Addendum)
WOC Nurse wound consult note Reason for Consult: Consult requested for sacrum and bilat buttocks.  Pt states he is bedridden at home and does not have assistance with cleaning up after a stool or repositioning. He was not aware that wounds have declined.   Wound type: There are 4 different wounds, which are surrounded by red macerated skin, appearance is consistent with moisture associated skin damage. Sacrum unstageable: 3X1cm, 50% eschar, 50% red Right buttock unstageable:1X1cm, 50% eschar, 50% red Right buttock stage 2 pressure injury; .5X.5X.1cm, pink and moist Left buttock: 6X3cm, 80% eschar, 20% red Pressure Injury POA: Yes Dressing procedure/placement/frequency:  Air mattress ordered to assist with pressure reduction.  Santyl for enzymatic debridement of nonviable tissue. Recommend home health assistance for wound care.  Pt could benefit from an air mattress after discharge.  He states he is unable to get OOB or perform self care; consider possible long term placement after discharge. Please re-consult if further assistance is needed.  Thank-you,  Cammie Mcgee MSN, RN, CWOCN, [place], CNS [phone]

## 2017-05-11 NOTE — Progress Notes (Signed)
Patient ID: Dylan [name], male   DOB: Jul 01, 1965, 52 y.o.   MRN: [id number] [facility] Progress Note   Assessment/ Plan:   1. Acute kidney injury: Suspected secondary to nephrotoxic ATN from supratherapeutic vancomycin levels.  Anuric overnight (50 cc urine output).  Underwent hemodialysis yesterday and tolerated 1.5 L ultrafiltration- will continue monitoring daily labs to decide on need for dialysis versus assessment of renal recovery.  Unfortunately, with his quadriplegia, it would be exceedingly difficult for him to undertake chronic hemodialysis as an outpatient if he indeed progresses on to end-stage renal disease.  Extensive discussions would need to be undertaken especially with his current depression. 2. Atrial fibrillation: Initially with rapid ventricular response that is now rate controlled on diltiazem/metoprolol and amiodarone. 3. Candidemia: On Anidulafungin as recommended by infectious disease-stop date of 4/16 based on repeat cultures. 4.  Cervical abscess versus hematoma: On empiric intravenous daptomycin and cefepime 5.  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  Urinary retention: Secondary to blockage of Foley catheter-underwent exchange by urology.  Subjective:   Reports to be tired this morning with difficulty sleeping overnight.  Tolerated dialysis without problem.   Objective:   BP 130/83 (BP Location: Left Arm)   Pulse 70   Temp 97.6 F (36.4 C) (Oral)   Resp 18   Ht 6\' 4"  (1.93 m)   Wt 109 kg (240 lb 4.8 oz)   SpO2 92%   BMI 29.25 kg/m   Intake/Output Summary (Last 24 hours) at 05/11/2017 0835 Last data filed at 05/10/2017 1702 Gross per 24 hour  Intake -  Output 1550 ml  Net -1550 ml   Weight change: -5.5 kg (-12 lb 2 oz)  Physical Exam: Gen: Appears to be fatigued-eating breakfast CVS: Pulse regular rhythm, normal rate, S1 and S2 normal Resp: Coarse breath sounds bilaterally-no  distinct rales or rhonchi Abd: Soft, obese, nontender Ext: 1+ ankle edema  Imaging: No results found.  Labs: BMET Recent Labs  Lab 05/05/17 0420 05/06/17 1003 05/07/17 1528 05/08/17 0342 05/09/17 0801 05/10/17 0241 05/11/17 0557  NA 134* 132* 129* 134* 133* 132* 133*  K 3.4* 3.8 4.1 4.3 4.5 5.5* 4.1  CL 93* 92* 90* 97* 98* 98* 100*  CO2 22 20* 19* 23 19* 20* 21*  GLUCOSE 215* 164* 202* 138* 176* 169* 159*  BUN 97* 125* 141* 76* 46* 62* 35*  CREATININE 7.83* 8.99* 9.61* 6.05* 4.65* 5.32* 3.46*  CALCIUM 7.4* 7.4* 7.2* 7.1* 7.2* 7.4* 7.4*  PHOS  --   --  7.8*  --   --   --   --    CBC Recent Labs  Lab 05/07/17 1528  WBC 13.7*  HGB 9.5*  HCT 27.6*  MCV 83.1  PLT 67*    Medications:    . amiodarone  200 mg Oral Daily  . dexamethasone  3 mg Oral Q8H  . diltiazem  60 mg Oral Q8H  . feeding supplement (GLUCERNA SHAKE)  237 mL Oral BID BM  . guaiFENesin  600 mg Oral BID  . insulin aspart  0-5 Units Subcutaneous QHS  . insulin aspart  0-9 Units Subcutaneous TID WC  . insulin aspart  2 Units Subcutaneous TID WC  . insulin glargine  12 Units Subcutaneous Daily  . metoprolol tartrate  12.5 mg Oral BID  . mirtazapine  15 mg Oral QHS  . pantoprazole  40 mg Oral Daily   Zetta Bills, MD 05/11/2017, 8:35 AM

## 2017-05-11 NOTE — Progress Notes (Signed)
Family called and would like to speak with Child psychotherapist. They will call back later today.

## 2017-05-11 NOTE — Progress Notes (Signed)
PROGRESS NOTE  Dylan [name]  UJW:[id number] DOB: [birth year] DOA: 04/17/2017 PCP: Patient, No Pcp Per   Brief Narrative: Dylan [name] is a 52 y.o. 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 abscess seen on MRI. He was also diagnosed with MS at that time. Neurosurgery felt the risks greatly outweighed benefits of repeat exploratory surgery. He was started on IV antibiotics due to complicated UTI, possible cervical abscess and was discharged to [facility] 3/8 with PICC, plan to continue vancomycin and cefepime until 4/4. Vancomycin was stopped earlier due to increasing creatinine.   Patient presented to ED with abd pain, found to have Cr of 5.68 and concerns for non-draining foley cath. After removing foreign body, cath yielded 2.5L urine. Patient also noted to have K in excess of 6.4, given kayexelate. ID was consulted and recommended change to daptomycin and cefepime. Blood cultures grew C. glabrata, so PICC was removed for line holiday and anidulafungin was started. Neurology was consulted and recommended deferral of MS treatment to the outpatient setting as there is no evidence of an acute flare. Nephrology was consulted with progressive oliguric renal failure. HD cath placed 4/5 and HD started.  Assessment & Plan: Principal Problem:   MDD (major depressive disorder), single episode, moderate (HCC) Active Problems:   Uncontrolled diabetes mellitus type 2 without complications (HCC)   Hypertension   Morbid obesity (HCC)   Paroxysmal atrial fibrillation (HCC)   Multiple sclerosis exacerbation (HCC)   Pressure injury of skin   Hyperkalemia   AKI (acute kidney injury) (HCC)   Hematoma   Insomnia   Adjustment reaction with anxiety and depression   Quadriplegia (HCC)   History of fusion of cervical spine   Postoperative wound infection   Coronary artery disease   Atherosclerotic peripheral vascular disease (HCC)   Candidemia (HCC)  Acute kidney injury: Due to vancomycin toxicity (elevated levels despite discontinuation), obstructive uropathy (obstructed foley at admission), not responsive to IVF.  - Stopped vancomycin - Patient seen by urology with foreign body removed with flush. Continue to monitor output. - Nephrology coordinating prn HD 4/5, 4/6, 4/8. HD catheter placed by IR 4/5. Would not be good candidate for outpatient longterm HD w/quadriplegia.   Candidemia, technically was septic on arrival which has improved: Candida glabrata in blood cultures.  - On anidulafungin per ID with current end date 4/16 (since repeat cultures remain negative). PICC removed.  - No intraocular involvement on ophtho exam 4/5. - Blood cultures repeated 4/4, NGTD - Echocardiogram performed 4/4 without vegetation.  Resolved Hepatitis B infection vs. false positive Hep core Ab: LFTs wnl - Defer management to ID   Cervical abscess vs. hematoma:  - Continue daptomycin and cefepime with end date 05/16/2017.  Quadriplegia: Related to cervical cord compression (abscess vs. hematoma), no change per patient. - Neurology had no further recommendations at this time. - Neurosurgery felt too high risk for surgery  Cough: Suspect due to atelectasis given his immobility and size.  Doubt pneumonia could develop with breadth of current antimicrobial regimen, but could consider CXR if worsening. No edema. - Incentive spirometry. Needs RN staff assistance with this! - Mucinex, prn tessalon  Multiple sclerosis:  - Outpatient management with Dr. Epimenio Foot recommended by neurology (signed off)  IDT2DM: HbA1c 8.4%. At inpatient goal without hypoglycemia - Holding  oral medications including metformin.  - Continue lantus (decreased on admission from home 70u) currently 12u daily w/FBS in  150's. Will increase modestly to 14u and monitor. - Continue SSI + 2 units TIDWC  Hyperkalemia: Resolved prior to HD.  - Monitoring daily labs  Afib: RVR resolved - Continue cardizem, lopressor, amiodarone. - Anticoagulation is contraindicated  Depression - Psychiatry evaluation performed 3/31, will follow recommendations for trazodone. No inpatient management recommended.   DVT prophylaxis: SCDs Code Status: DNR confirmed at admission Family Communication: None at bedside Disposition Plan: Likely SNF, still not certain about renal prognosis  Consultants:   Neurology  Infectious disease  Nephrology  Procedures:  Echocardiogram 05/06/2017:  - Left ventricle: The cavity size was normal. Wall thickness was   increased in a pattern of moderate LVH. Indeterminant diastolic   function (atrial fibrillation). The estimated ejection fraction   was 55%. Although no diagnostic regional wall motion abnormality   was identified, this possibility cannot be completely excluded on   the basis of this study. - Aortic valve: There was no stenosis. - Mitral valve: Mildly calcified annulus. There was no significant   regurgitation. - Right ventricle: Poorly visualized. Probably normal size and   systolic function. - Pulmonary arteries: No complete TR doppler jet so unable to   estimate PA systolic pressure. - Inferior vena cava: The vessel was normal in size. The   respirophasic diameter changes were in the normal range (>= 50%),   consistent with normal central venous pressure.  Impressions: - Technically difficult study with poor acoustic windows. Normal LV   size with moderate LV hypertrophy. EF 55%. Poorly visualized RV   but probably normal. No significant valvular abnormalities.  HD 4/5, 4/6  Antimicrobials: - Daptomycin and cefepime 4/1 >>  - Anidulafungin 4/3 >>   Subjective: Very little UOP. Refreshed after bed bath today. Eating ok. Has had cough for the past day or  so which comes and goes, improved with antitussives. No choking, dyspnea, chest pain, fever.  Objective: Vitals:   05/10/17 2044 05/11/17 0011 05/11/17 0331 05/11/17 0728  BP: (!) 146/88 (!) 145/87 (!) 151/99 130/83  Pulse: 80 72 69 70  Resp: 16 16 18 18   Temp: 98 F (36.7 C) 97.8 F (36.6 C) 97.8 F (36.6 C) 97.6 F (36.4 C)  TempSrc: Oral Oral Oral Oral  SpO2: 96% 92% 90% 92%  Weight:      Height:        Intake/Output Summary (Last 24 hours) at 05/11/2017 1229 Last data filed at 05/10/2017 1702 Gross per 24 hour  Intake -  Output 1500 ml  Net -1500 ml   Filed Weights   05/10/17 0500 05/10/17 1302 05/10/17 1702  Weight: 116.5 kg (256 lb 13.4 oz) 111 kg (244 lb 11.4 oz) 109 kg (240 lb 4.8 oz)    Gen: 52 y.o. male in no distress  Pulm: Non-labored breathing room air. Clear to auscultation bilaterally and anteriorly.  CV: Regular rate and rhythm. No murmur, rub, or gallop. No edema or JVD. GI: Abdomen soft, non-tender, non-distended, with normoactive bowel sounds. No organomegaly or masses felt. GU: +Foley Ext: Warm, no deformities Skin: No rashes, lesions or ulcers Neuro: Alert and oriented. Has near zero / 5 strength in shoulders, elbow but minimal movement, otherwise flaccid quadriplegia. Psych: Judgement and insight appear normal. Mood depressed, congruent affect, not flattened.  Data Reviewed: I have personally reviewed following labs and imaging studies  CBC: Recent Labs  Lab 05/07/17 1528  WBC  13.7*  HGB 9.5*  HCT 27.6*  MCV 83.1  PLT 67*   Basic Metabolic Panel: Recent Labs  Lab 05/07/17 1528 05/08/17 0342 05/09/17 0801 05/10/17 0241 05/11/17 0557  NA 129* 134* 133* 132* 133*  K 4.1 4.3 4.5 5.5* 4.1  CL 90* 97* 98* 98* 100*  CO2 19* 23 19* 20* 21*  GLUCOSE 202* 138* 176* 169* 159*  BUN 141* 76* 46* 62* 35*  CREATININE 9.61* 6.05* 4.65* 5.32* 3.46*  CALCIUM 7.2* 7.1* 7.2* 7.4* 7.4*  PHOS 7.8*  --   --   --   --    GFR: Estimated Creatinine  Clearance: 35 mL/min (A) (by C-G formula based on SCr of 3.46 mg/dL (H)). Liver Function Tests: Recent Labs  Lab 05/07/17 1528  ALBUMIN 1.8*   No results for input(s): LIPASE, AMYLASE in the last 168 hours. No results for input(s): AMMONIA in the last 168 hours. Coagulation Profile: No results for input(s): INR, PROTIME in the last 168 hours. Cardiac Enzymes: Recent Labs  Lab 05/07/17 0538 05/11/17 0557  CKTOTAL 176 97   BNP (last 3 results) No results for input(s): PROBNP in the last 8760 hours. HbA1C: No results for input(s): HGBA1C in the last 72 hours. CBG: Recent Labs  Lab 05/10/17 0652 05/10/17 1817 05/10/17 2150 05/11/17 0645 05/11/17 1157  GLUCAP 176* 157* 150* 159* 226*   Lipid Profile: No results for input(s): CHOL, HDL, LDLCALC, TRIG, CHOLHDL, LDLDIRECT in the last 72 hours. Thyroid Function Tests: No results for input(s): TSH, T4TOTAL, FREET4, T3FREE, THYROIDAB in the last 72 hours. Anemia Panel: No results for input(s): VITAMINB12, FOLATE, FERRITIN, TIBC, IRON, RETICCTPCT in the last 72 hours. Urine analysis:    Component Value Date/Time   COLORURINE YELLOW 04/27/2017 0920   APPEARANCEUR TURBID (A) 04/22/2017 0920   LABSPEC 1.015 04/02/2017 0920   PHURINE 6.0 04/13/2017 0920   GLUCOSEU NEGATIVE 04/28/2017 0920   HGBUR LARGE (A) 04/27/2017 0920   BILIRUBINUR NEGATIVE 04/28/2017 0920   KETONESUR NEGATIVE 04/03/2017 0920   PROTEINUR 30 (A) 04/18/2017 0920   NITRITE NEGATIVE 04/27/2017 0920   LEUKOCYTESUR MODERATE (A) 04/13/2017 0920   Recent Results (from the past 240 hour(s))  Culture, blood (routine x 2)     Status: None   Collection Time: 05/06/17  2:33 PM  Result Value Ref Range Status   Specimen Description BLOOD LEFT HAND  Final   Special Requests   Final    BOTTLES DRAWN AEROBIC ONLY Blood Culture adequate volume   Culture   Final    NO GROWTH 5 DAYS Performed at [facility], [address], [place], [place] [address]    Report  Status 05/11/2017 FINAL  Final  Culture, blood (routine x 2)     Status: None   Collection Time: 05/06/17  2:40 PM  Result Value Ref Range Status   Specimen Description BLOOD LEFT WRIST  Final   Special Requests   Final    BOTTLES DRAWN AEROBIC ONLY Blood Culture adequate volume   Culture   Final    NO GROWTH 5 DAYS Performed at [facility], [address], [place], [place] [address]    Report Status 05/11/2017 FINAL  Final      Radiology Studies: No results found.  Scheduled Meds: . amiodarone  200 mg Oral Daily  . collagenase   Topical Daily  . dexamethasone  3 mg Oral Q8H  . diltiazem  60 mg Oral Q8H  . feeding supplement (GLUCERNA SHAKE)  237 mL Oral BID BM  . guaiFENesin  600 mg Oral BID  . insulin aspart  0-5 Units Subcutaneous QHS  . insulin aspart  0-9 Units Subcutaneous TID WC  . insulin aspart  2 Units Subcutaneous TID WC  . insulin glargine  12 Units Subcutaneous Daily  . metoprolol tartrate  12.5 mg Oral BID  . mirtazapine  15 mg Oral QHS  . pantoprazole  40 mg Oral Daily   Continuous Infusions: . sodium chloride    . sodium chloride    . anidulafungin Stopped (05/11/17 0112)  . ceFEPime (MAXIPIME) IV Stopped (05/10/17 1901)  . DAPTOmycin (CUBICIN)  IV Stopped (05/10/17 2137)     LOS: 10 days   Time spent: 25 minutes.  Hazeline Junker, MD [facility] Pager [phone]  If 7PM-7AM, please contact night-coverage [email or web address] Password [facility] 05/11/2017, 12:29 PM

## 2017-05-11 NOTE — Progress Notes (Signed)
Pharmacy Antibiotic Note  Dylan [name] is a 52 y.o. male admitted on 04/21/2017 with spinal abscess. Patient began treatment with vancomycin and cefepime on 04/09/17 with plan to continue through 05/16/17.  Patient has worsening renal function so vancomycin was switched to Cubicin.  He as fungemia and is on Eraxis.  Patient new to HD with last session 05/10/17.  Afebrile and last WBC was 13.7.  CK is now back down following HD.   Plan:  Cubicin to 900mg  IV Q48H (~ 8 mg/kg TBW) Cefepime 1gm IV Q24H CK qFri  F/U plan for HD (will keep Cubicin at Q48H interval until stable on HD schedule, then switch to qHD)   Height: 6\' 4"  (193 cm) Weight: 240 lb 4.8 oz (109 kg) IBW/kg (Calculated) : 86.8  Temp (24hrs), Avg:98.1 F (36.7 C), Min:97.6 F (36.4 C), Max:98.7 F (37.1 C)  Recent Labs  Lab 05/07/17 1528 05/08/17 0342 05/09/17 0801 05/10/17 0241 05/11/17 0557  WBC 13.7*  --   --   --   --   CREATININE 9.61* 6.05* 4.65* 5.32* 3.46*    Estimated Creatinine Clearance: 35 mL/min (A) (by C-G formula based on SCr of 3.46 mg/dL (H)).    No Known Allergies    Vanc 3/8 PTA >> 3/29 Cefepime 3/28 PTA >> (4/14) Cubicin 3/31 >> (4/14) Eraxis 4/2 >>  3/31 VR = 57 mcg/mL (500mg  q12) 3/30 CK = 36 (prior to Cubicin) 4/5 CK = 176  3/6 BCx: NGF 3/30 UCx - negative 3/30 BCx PICC line: C.glabrata 4/4 BCx -    Ruben Im, PharmD Clinical Pharmacist 05/11/2017 12:52 PM

## 2017-05-12 LAB — GLUCOSE, CAPILLARY
GLUCOSE-CAPILLARY: 157 mg/dL — AB (ref 65–99)
GLUCOSE-CAPILLARY: 169 mg/dL — AB (ref 65–99)
GLUCOSE-CAPILLARY: 259 mg/dL — AB (ref 65–99)
GLUCOSE-CAPILLARY: 228 mg/dL — AB (ref 65–99)

## 2017-05-12 LAB — FIBRINOGEN: Fibrinogen: 254 mg/dL (ref 210–475)

## 2017-05-12 LAB — CBC
HCT: 29.8 % — ABNORMAL LOW (ref 39.0–52.0)
HEMOGLOBIN: 10.5 g/dL — AB (ref 13.0–17.0)
MCH: 29.7 pg (ref 26.0–34.0)
MCHC: 35.2 g/dL (ref 30.0–36.0)
MCV: 84.4 fL (ref 78.0–100.0)
Platelets: 42 10*3/uL — ABNORMAL LOW (ref 150–400)
RBC: 3.53 MIL/uL — AB (ref 4.22–5.81)
RDW: 13.8 % (ref 11.5–15.5)
WBC: 13.6 10*3/uL — ABNORMAL HIGH (ref 4.0–10.5)

## 2017-05-12 LAB — PROTIME-INR
INR: 1.27
PROTHROMBIN TIME: 15.8 s — AB (ref 11.4–15.2)

## 2017-05-12 LAB — RENAL FUNCTION PANEL
ANION GAP: 13 (ref 5–15)
Albumin: 1.8 g/dL — ABNORMAL LOW (ref 3.5–5.0)
BUN: 44 mg/dL — ABNORMAL HIGH (ref 6–20)
CHLORIDE: 98 mmol/L — AB (ref 101–111)
CO2: 23 mmol/L (ref 22–32)
CREATININE: 4.17 mg/dL — AB (ref 0.61–1.24)
Calcium: 7.5 mg/dL — ABNORMAL LOW (ref 8.9–10.3)
GFR calc Af Amer: 18 mL/min — ABNORMAL LOW (ref >60)
GFR calc non Af Amer: 15 mL/min — ABNORMAL LOW (ref >60)
GLUCOSE: 148 mg/dL — AB (ref 65–99)
Phosphorus: 4.5 mg/dL (ref 2.5–4.6)
Potassium: 3.9 mmol/L (ref 3.5–5.1)
SODIUM: 134 mmol/L — AB (ref 135–145)

## 2017-05-12 LAB — APTT: APTT: 28 s (ref 24–36)

## 2017-05-12 LAB — LACTATE DEHYDROGENASE: LDH: 530 U/L — ABNORMAL HIGH (ref 98–192)

## 2017-05-12 MED ORDER — FUROSEMIDE 10 MG/ML IJ SOLN
80.0000 mg | Freq: Three times a day (TID) | INTRAMUSCULAR | Status: DC
  Administered 2017-05-12 (×3): 80 mg via INTRAVENOUS
  Filled 2017-05-12 (×4): qty 8

## 2017-05-12 NOTE — Progress Notes (Signed)
PROGRESS NOTE    Dylan [name]  ZOX:[id number] DOB: [birth year] DOA: 04/07/2017 PCP: Patient, No Pcp Per      Brief Narrative:  Dylan [name] is a 52 y.o. 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 abscess seen on MRI. He was also diagnosed with MS at that time. Neurosurgery felt the risks greatly outweighed benefits of repeat exploratory surgery. He was started on IV antibiotics due to complicated UTI, possible cervical abscess and was discharged to [facility] 3/8 with PICC, plan to continue vancomycin and cefepime until 4/4. Vancomycin was stopped earlier due to increasing creatinine.   Patient presented to ED with abd pain, found to have Cr of 5.68 and concerns for non-draining foley cath. After removing foreign body, cath yielded 2.5L urine. Patient also noted to have K in excess of 6.4, given kayexelate. ID was consulted and recommended change to daptomycin and cefepime. Blood cultures grew C. glabrata, so PICC was removed for line holiday and anidulafungin was started. Neurology was consulted and recommended deferral of MS treatment to the outpatient setting as there is no evidence of an acute flare. Nephrology was consulted with progressive oliguric renal failure. HD cath placed 4/5 and HD started.     Assessment & Plan:  Quadriplegia Cervical abscess Cervical hematoma -Continue daptomycin, cefepime and date 4/14 -PT evaluation  Acute renal failure Has TDC.  Urine output 400 cc yesterday. -Consult to nephrology -Nephrology to trial Furosemide today  Candidemia -Continue Eraxis   Thrombocytopenia Worsening.   Started well before Eraxis and dapto/cefepime were started.  No heparin in the last week. -Obtain LDH, fibrinogen, smear -Obtain coags -Obtain Hep  C Ab -Trend CBC  Type 2 diabetes, with nephropathy, on long-term insulin -Continue Glargine -Continue SSI   Atrial fibrillation paroxysmal CHADS2Vasc 1.  Anticoagulation contraindicated. -Continue amiodarone, diltiazem, metoprolol   Adjustment disorder versus depression -Continue mirtazapine   Multiple sclerosis -Follow up with Neurology as outpatient.  Other medications -Continue Glucerna  Hyponatremia Mild  Anemia In context of severe illness. Stable.     DVT prophylaxis: SCDs Code Status: FULL Family Communication: None present MDM and disposition Plan: The below labs and imaging reports were reviewed.  The patient's status is clinically stable.       Consultants:   Ophtho  Neurology  Psychiatry  Nephrology  Infectious disease  Procedures:   Echocardiogram 4/4 Study Conclusions  - Left ventricle: The cavity size was normal. Wall thickness was   increased in a pattern of moderate LVH. Indeterminant diastolic   function (atrial fibrillation). The estimated ejection fraction   was 55%. Although no diagnostic regional wall motion abnormality   was identified, this possibility cannot be completely excluded on   the basis of this study. - Aortic valve: There was no stenosis. - Mitral valve: Mildly calcified annulus. There was no significant   regurgitation. - Right ventricle: Poorly visualized. Probably normal size and   systolic function. - Pulmonary arteries: No complete TR doppler jet so unable to   estimate PA systolic pressure. - Inferior vena cava: The vessel was normal in size. The   respirophasic diameter changes were in the normal range (>= 50%),   consistent with normal central venous pressure.  Impressions:  - Technically difficult study with poor acoustic windows. Normal LV   size with moderate  LV hypertrophy. EF 55%. Poorly visualized RV   but probably normal. No significant valvular abnormalities.  Antimicrobials:   Cefepime  3/30 >>  Anidulafungin 4/2 >>  Daptomycin 3/31 >>    Subjective: Feels sad but no new fever, neck pain, confusion, vomiting.  Has some mild jerking in leg yesterday.  No ability to move legs voluntarily, no grip.  Urine output low yesterday.  Objective: Vitals:   05/11/17 2018 05/11/17 2330 05/12/17 0412 05/12/17 0815  BP: (!) 152/88 (!) 152/83 (!) 165/81 (!) 147/80  Pulse: 69 71 75 64  Resp: 18 20 20 19   Temp: 97.9 F (36.6 C) 97.7 F (36.5 C) 97.6 F (36.4 C) 97.8 F (36.6 C)  TempSrc: Oral Oral Oral Oral  SpO2: 100% 97% 98% 98%  Weight:   132 kg (291 lb 1.6 oz)   Height:        Intake/Output Summary (Last 24 hours) at 05/12/2017 0911 Last data filed at 05/12/2017 0800 Gross per 24 hour  Intake 360 ml  Output 400 ml  Net -40 ml   Filed Weights   05/10/17 1302 05/10/17 1702 05/12/17 0412  Weight: 111 kg (244 lb 11.4 oz) 109 kg (240 lb 4.8 oz) 132 kg (291 lb 1.6 oz)    Examination: General appearance:  adult male, alert and in no acute distress.  Interactive, lying in bed HEENT: Anicteric, conjunctiva pink, lids and lashes normal. No nasal deformity, discharge, epistaxis.  Lips moist, dentition good, no oral lesions, OP moist.   Skin: Warm and dry.   No suspicious rashes or lesions. Cardiac: RRR, nl S1-S2, no murmurs appreciated.  Capillary refill is brisk.  JVP not visible.  No LE edema.  Radial pulses 2+ and symmetric. Respiratory: Normal respiratory rate and rhythm.  CTAB without rales or wheezes. Abdomen: Abdomen soft.  No TTP. No ascites, distension, hepatosplenomegaly.   MSK: No deformities or effusions. Neuro: Awake and alert.  EOMI, flaccid legs.  Arms move a little, hands flaccid. Speech fluent.    Psych: Sensorium intact and responding to questions, attention normal. Affect blunted.  Judgment and insight appear normal.    Data Reviewed: I have personally reviewed following labs and imaging studies:  CBC: Recent Labs  Lab 05/07/17 1528 05/12/17 0527    WBC 13.7* 13.6*  HGB 9.5* 10.5*  HCT 27.6* 29.8*  MCV 83.1 84.4  PLT 67* 42*   Basic Metabolic Panel: Recent Labs  Lab 05/07/17 1528 05/08/17 0342 05/09/17 0801 05/10/17 0241 05/11/17 0557 05/12/17 0527  NA 129* 134* 133* 132* 133* 134*  K 4.1 4.3 4.5 5.5* 4.1 3.9  CL 90* 97* 98* 98* 100* 98*  CO2 19* 23 19* 20* 21* 23  GLUCOSE 202* 138* 176* 169* 159* 148*  BUN 141* 76* 46* 62* 35* 44*  CREATININE 9.61* 6.05* 4.65* 5.32* 3.46* 4.17*  CALCIUM 7.2* 7.1* 7.2* 7.4* 7.4* 7.5*  PHOS 7.8*  --   --   --   --  4.5   GFR: Estimated Creatinine Clearance: 31.8 mL/min (A) (by C-G formula based on SCr of 4.17 mg/dL (H)). Liver Function Tests: Recent Labs  Lab 05/07/17 1528 05/12/17 0527  ALBUMIN 1.8* 1.8*   No results for input(s): LIPASE, AMYLASE in the last 168 hours. No results for input(s): AMMONIA in the last 168 hours. Coagulation Profile: No results for input(s): INR, PROTIME in the last 168 hours. Cardiac Enzymes: Recent Labs  Lab 05/07/17 0538 05/11/17 0557  CKTOTAL 176 97   BNP (last 3 results)  No results for input(s): PROBNP in the last 8760 hours. HbA1C: No results for input(s): HGBA1C in the last 72 hours. CBG: Recent Labs  Lab 05/11/17 1157 05/11/17 1732 05/11/17 1836 05/11/17 2113 05/12/17 0614  GLUCAP 226* 260* 267* 238* 157*   Lipid Profile: No results for input(s): CHOL, HDL, LDLCALC, TRIG, CHOLHDL, LDLDIRECT in the last 72 hours. Thyroid Function Tests: No results for input(s): TSH, T4TOTAL, FREET4, T3FREE, THYROIDAB in the last 72 hours. Anemia Panel: No results for input(s): VITAMINB12, FOLATE, FERRITIN, TIBC, IRON, RETICCTPCT in the last 72 hours. Urine analysis:    Component Value Date/Time   COLORURINE YELLOW 04/03/2017 0920   APPEARANCEUR TURBID (A) 04/23/2017 0920   LABSPEC 1.015 04/18/2017 0920   PHURINE 6.0 04/04/2017 0920   GLUCOSEU NEGATIVE 04/28/2017 0920   HGBUR LARGE (A) 04/24/2017 0920   BILIRUBINUR NEGATIVE 04/12/2017  0920   KETONESUR NEGATIVE 04/15/2017 0920   PROTEINUR 30 (A) 04/17/2017 0920   NITRITE NEGATIVE 05/02/2017 0920   LEUKOCYTESUR MODERATE (A) 04/23/2017 0920   Sepsis Labs: @LABRCNTIP (procalcitonin:4,lacticacidven:4)  ) Recent Results (from the past 240 hour(s))  Culture, blood (routine x 2)     Status: None   Collection Time: 05/06/17  2:33 PM  Result Value Ref Range Status   Specimen Description BLOOD LEFT HAND  Final   Special Requests   Final    BOTTLES DRAWN AEROBIC ONLY Blood Culture adequate volume   Culture   Final    NO GROWTH 5 DAYS Performed at [facility], [address], [place], [place] [address]    Report Status 05/11/2017 FINAL  Final  Culture, blood (routine x 2)     Status: None   Collection Time: 05/06/17  2:40 PM  Result Value Ref Range Status   Specimen Description BLOOD LEFT WRIST  Final   Special Requests   Final    BOTTLES DRAWN AEROBIC ONLY Blood Culture adequate volume   Culture   Final    NO GROWTH 5 DAYS Performed at [facility], [address] [place], [place] [address]    Report Status 05/11/2017 FINAL  Final         Radiology Studies: No results found.      Scheduled Meds: . amiodarone  200 mg Oral Daily  . collagenase   Topical Daily  . dexamethasone  3 mg Oral Q8H  . diltiazem  60 mg Oral Q8H  . feeding supplement (GLUCERNA SHAKE)  237 mL Oral BID BM  . guaiFENesin  600 mg Oral BID  . insulin aspart  0-5 Units Subcutaneous QHS  . insulin aspart  0-9 Units Subcutaneous TID WC  . insulin aspart  2 Units Subcutaneous TID WC  . insulin glargine  12 Units Subcutaneous Daily  . metoprolol tartrate  12.5 mg Oral BID  . mirtazapine  15 mg Oral QHS  . pantoprazole  40 mg Oral Daily   Continuous Infusions: . sodium chloride    . sodium chloride    . anidulafungin Stopped (05/11/17 2328)  . ceFEPime (MAXIPIME) IV 1 g (05/11/17 1828)  . DAPTOmycin (CUBICIN)  IV Stopped (05/10/17 2137)     LOS: 11 days    Time  spent: 30 minutes    Alberteen Sam, MD [facility] 05/12/2017, 9:11 AM     Pager [phone] --- please page though AMION:  [email or web address] Password [email or web address] If 7PM-7AM, please contact night-coverage

## 2017-05-12 NOTE — Evaluation (Signed)
Physical Therapy Evaluation Patient Details Name: Dylan [name] MRN: [id number] DOB: 04-Jun-1965 Today's Date: 05/12/2017   History of Present Illness  Dylan [name] is a 52yo black male who comes to [facility] on 3/30 with ABD pain and hypoglycemia, admitted with cervical abcess v hematoma s/p C five-seven surgery on . 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 He DC to SNF ~3WA with motor weakness C7 down, full sensation intact. He reports graual worsening of manipulative use with smartphone over those three weaks.    Clinical Impression  Pt admitted with above diagnosis. Pt currently with functional limitations due to the deficits listed below (see "PT Problem List"). Upon entry, the patient is received in bed, receiving wound dressing care from nursing staff x3. PT assists team in [facility] transfer from bed to chair. Pt requires total assistance for all functional mobility and ADL care at this time d/t quadriparesis. MMT of upper limbs demonstrates no motor activation in gross hand closing/opening bilat, whereas the patient reports ability to use hands to operate smartphone 4WA at DC to STR/SNF. Additional motor deficits as detailed below. Pt reports he was working with PT every day, but has not noticed any improvement since starting. His progress is likely impacted by his current abscess v hematoma problem. Pt denies pain. Pt will benefit from skilled PT intervention to increase independence and safety with basic mobility in preparation for discharge to the venue listed below.     MMT Grip  Digit extention  Wrist Extension Wrist Flexion  Elbow flexion Elbow Extension Shoulder abduction Shoulder elevation  Right absent absent 3+/5 3+/5 4-/5 3+/5 3-/5 4+/5  Left absent absent absent absent 3+/5 absent  3-/5 4+/5        Follow Up Recommendations  SNF;Supervision/Assistance - 24 hour    Equipment Recommendations  None recommended by PT    Recommendations for Other Services OT consult     Precautions / Restrictions Precautions Precautions: Fall;Cervical Restrictions Weight Bearing Restrictions: No      Mobility  Bed Mobility Overal bed mobility: Needs Assistance Bed Mobility: Rolling Rolling: +2 for safety/equipment;+2 for physical assistance;Total assist         General bed mobility comments: needs assistance with head control at times   Transfers Overall transfer level: Needs assistance               General transfer comment: 2RN, 1NA, 1PT   Ambulation/Gait Ambulation/Gait assistance: (Pt does not AMB at this time)              Careers information officer    Modified Rankin (Stroke Patients Only)       Balance Overall balance assessment: Needs assistance   Sitting balance-Leahy Scale: Zero       Standing balance-Leahy Scale: Zero                               Pertinent Vitals/Pain Pain Assessment: No/denies pain    Home Living Family/patient expects to be discharged to:: Skilled nursing facility                      Prior Function Level of Independence: Needs assistance   Gait / Transfers Assistance Needed: PTA pt was quadriplegic, intermittent motor use  from C6 up.   ADL's / Homemaking Assistance Needed: Pt required total A with feeding, dressing, bathing, toiletting.         Hand Dominance   Dominant Hand: Right    Extremity/Trunk Assessment   Upper Extremity Assessment Upper Extremity Assessment: Generalized weakness;Defer to OT evaluation(*see note for detail)    Lower Extremity Assessment Lower Extremity Assessment: Generalized weakness(sensation intact, total motor loss in BLE )    Cervical / Trunk Assessment Cervical / Trunk Assessment: (Mildly limited pain free cervical extension, rotation Lt>Rt as expected s/p cervical  surgery. )  Communication   Communication: No difficulties  Cognition Arousal/Alertness: Awake/alert Behavior During Therapy: WFL for tasks assessed/performed Overall Cognitive Status: Within Functional Limits for tasks assessed                                        General Comments      Exercises     Assessment/Plan    PT Assessment Patient needs continued PT services  PT Problem List Decreased strength;Decreased range of motion;Decreased activity tolerance;Decreased mobility       PT Treatment Interventions Therapeutic activities;Therapeutic exercise;Patient/family education    PT Goals (Current goals can be found in the Care Plan section)  Acute Rehab PT Goals Patient Stated Goal: Cotninue with rehab to return to walking  PT Goal Formulation: With patient Time For Goal Achievement: 05/26/17 Potential to Achieve Goals: Fair    Frequency Min 2X/week   Barriers to discharge        Co-evaluation               AM-PAC PT "6 Clicks" Daily Activity  Outcome Measure Difficulty turning over in bed (including adjusting bedclothes, sheets and blankets)?: Unable Difficulty moving from lying on back to sitting on the side of the bed? : Unable Difficulty sitting down on and standing up from a chair with arms (e.g., wheelchair, bedside commode, etc,.)?: Unable Help needed moving to and from a bed to chair (including a wheelchair)?: Total Help needed walking in hospital room?: Total Help needed climbing 3-5 steps with a railing? : Total 6 Click Score: 6    End of Session   Activity Tolerance: Patient tolerated treatment well;No increased pain Patient left: in chair;with call bell/phone within reach;with family/visitor present Nurse Communication: Need for lift equipment PT Visit Diagnosis: Other symptoms and signs involving the nervous system (R29.898);Difficulty in walking, not elsewhere classified (R26.2);Muscle weakness (generalized) (M62.81)     Time: 1610-9604 PT Time Calculation (min) (ACUTE ONLY): 18 min   Charges:   PT Evaluation $PT Eval Moderate Complexity: 1 Mod     PT G Codes:        12:55 PM, 05-26-17 Rosamaria Lints, PT, DPT Physical Therapist - [place] [phone] (Pager)  [phone] (Office)      Guiliana Shor C 05/26/17, 12:46 PM

## 2017-05-12 NOTE — Progress Notes (Signed)
RN spoke to lab regarding "pathologist smear review" and per Selena Batten form lab will add to collection this AM.   Sim Boast, RN

## 2017-05-12 NOTE — Progress Notes (Signed)
Patient ID: Dylan [name], male   DOB: 05/17/1965, 52 y.o.   MRN: [id number] [facility] Progress Note   Assessment/ Plan:   1. Acute kidney injury: Suspected secondary to nephrotoxic ATN from supratherapeutic vancomycin levels.  With improving urine output noted overnight-400 cc and concomitantly a slight rise in creatinine without any acute electrolyte abnormality or compelling indications for hemodialysis the plan today is to attempt augmenting his urine output with diuretics and reassess labs /clinical status again tomorrow to assess for acute dialysis needs-we had a discussion today regarding chronic hemodialysis and its barriers. 2. Atrial fibrillation: Initially with rapid ventricular response that is now rate controlled on diltiazem/metoprolol and amiodarone. 3. Candidemia: On Anidulafungin as recommended by infectious disease-stop date of 4/16 based on repeat cultures. 4.  Cervical abscess versus hematoma: On empiric intravenous daptomycin and cefepime 5.  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  Urinary retention: Secondary to blockage of Foley catheter-underwent exchange by urology.  Subjective:   Denies any active complaints at this time.  Denies any chest pain shortness of breath..   Objective:   BP (!) 147/80 (BP Location: Left Arm)   Pulse 64   Temp 97.8 F (36.6 C) (Oral)   Resp 19   Ht 6\' 4"  (1.93 m)   Wt 132 kg (291 lb 1.6 oz)   SpO2 98%   BMI 35.43 kg/m   Intake/Output Summary (Last 24 hours) at 05/12/2017 0910 Last data filed at 05/12/2017 0800 Gross per 24 hour  Intake 360 ml  Output 400 ml  Net -40 ml   Weight change: 21 kg (46 lb 6.2 oz)  Physical Exam: Gen: Appears to be comfortable resting in bed CVS: Pulse regular rhythm, normal rate, S1 and S2 normal Resp: Coarse breath sounds bilaterally-no distinct rales or rhonchi Abd: Soft, obese, nontender Ext: Trace-1+ ankle  edema  Imaging: No results found.  Labs: BMET Recent Labs  Lab 05/06/17 1003 05/07/17 1528 05/08/17 0342 05/09/17 0801 05/10/17 0241 05/11/17 0557 05/12/17 0527  NA 132* 129* 134* 133* 132* 133* 134*  K 3.8 4.1 4.3 4.5 5.5* 4.1 3.9  CL 92* 90* 97* 98* 98* 100* 98*  CO2 20* 19* 23 19* 20* 21* 23  GLUCOSE 164* 202* 138* 176* 169* 159* 148*  BUN 125* 141* 76* 46* 62* 35* 44*  CREATININE 8.99* 9.61* 6.05* 4.65* 5.32* 3.46* 4.17*  CALCIUM 7.4* 7.2* 7.1* 7.2* 7.4* 7.4* 7.5*  PHOS  --  7.8*  --   --   --   --  4.5   CBC Recent Labs  Lab 05/07/17 1528 05/12/17 0527  WBC 13.7* 13.6*  HGB 9.5* 10.5*  HCT 27.6* 29.8*  MCV 83.1 84.4  PLT 67* 42*    Medications:    . amiodarone  200 mg Oral Daily  . collagenase   Topical Daily  . dexamethasone  3 mg Oral Q8H  . diltiazem  60 mg Oral Q8H  . feeding supplement (GLUCERNA SHAKE)  237 mL Oral BID BM  . guaiFENesin  600 mg Oral BID  . insulin aspart  0-5 Units Subcutaneous QHS  . insulin aspart  0-9 Units Subcutaneous TID WC  . insulin aspart  2 Units Subcutaneous TID WC  . insulin glargine  12 Units Subcutaneous Daily  . metoprolol tartrate  12.5 mg Oral BID  . mirtazapine  15 mg Oral QHS  . pantoprazole  40 mg Oral Daily   Zetta Bills, MD 05/12/2017, 9:10 AM

## 2017-05-13 DIAGNOSIS — Z794 Long term (current) use of insulin: Secondary | ICD-10-CM

## 2017-05-13 DIAGNOSIS — E119 Type 2 diabetes mellitus without complications: Secondary | ICD-10-CM

## 2017-05-13 DIAGNOSIS — Z79899 Other long term (current) drug therapy: Secondary | ICD-10-CM

## 2017-05-13 DIAGNOSIS — D696 Thrombocytopenia, unspecified: Secondary | ICD-10-CM

## 2017-05-13 LAB — PATHOLOGIST SMEAR REVIEW

## 2017-05-13 LAB — CBC
HEMATOCRIT: 29.2 % — AB (ref 39.0–52.0)
HEMOGLOBIN: 10.4 g/dL — AB (ref 13.0–17.0)
MCH: 30.1 pg (ref 26.0–34.0)
MCHC: 35.6 g/dL (ref 30.0–36.0)
MCV: 84.4 fL (ref 78.0–100.0)
Platelets: 36 10*3/uL — ABNORMAL LOW (ref 150–400)
RBC: 3.46 MIL/uL — AB (ref 4.22–5.81)
RDW: 14 % (ref 11.5–15.5)
WBC: 11.9 10*3/uL — AB (ref 4.0–10.5)

## 2017-05-13 LAB — RENAL FUNCTION PANEL
ALBUMIN: 1.9 g/dL — AB (ref 3.5–5.0)
Anion gap: 13 (ref 5–15)
BUN: 56 mg/dL — ABNORMAL HIGH (ref 6–20)
CALCIUM: 7.3 mg/dL — AB (ref 8.9–10.3)
CO2: 21 mmol/L — ABNORMAL LOW (ref 22–32)
CREATININE: 4.77 mg/dL — AB (ref 0.61–1.24)
Chloride: 100 mmol/L — ABNORMAL LOW (ref 101–111)
GFR calc Af Amer: 15 mL/min — ABNORMAL LOW (ref >60)
GFR calc non Af Amer: 13 mL/min — ABNORMAL LOW (ref >60)
GLUCOSE: 171 mg/dL — AB (ref 65–99)
Phosphorus: 5.1 mg/dL — ABNORMAL HIGH (ref 2.5–4.6)
Potassium: 4.2 mmol/L (ref 3.5–5.1)
SODIUM: 134 mmol/L — AB (ref 135–145)

## 2017-05-13 LAB — GLUCOSE, CAPILLARY
GLUCOSE-CAPILLARY: 204 mg/dL — AB (ref 65–99)
GLUCOSE-CAPILLARY: 206 mg/dL — AB (ref 65–99)
GLUCOSE-CAPILLARY: 213 mg/dL — AB (ref 65–99)
Glucose-Capillary: 162 mg/dL — ABNORMAL HIGH (ref 65–99)

## 2017-05-13 LAB — HCV COMMENT:

## 2017-05-13 LAB — HEPATITIS C ANTIBODY (REFLEX): HCV Ab: <0.1 s/co ratio (ref 0.0–0.9)

## 2017-05-13 MED ORDER — FUROSEMIDE 10 MG/ML IJ SOLN
120.0000 mg | Freq: Three times a day (TID) | INTRAVENOUS | Status: AC
  Administered 2017-05-13 (×3): 120 mg via INTRAVENOUS
  Filled 2017-05-13: qty 12
  Filled 2017-05-13: qty 10
  Filled 2017-05-13: qty 12

## 2017-05-13 NOTE — Progress Notes (Signed)
PROGRESS NOTE    Dylan [name]  ZOX:[id number] DOB: 1965/07/10 DOA: 04/06/2017 PCP: Patient, No Pcp Per      Brief Narrative:  Dylan [name] is a 52 y.o. 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 abscess seen on MRI. He was also diagnosed with MS at that time. Neurosurgery felt the risks greatly outweighed benefits of repeat exploratory surgery. He was started on IV antibiotics due to complicated UTI, possible cervical abscess and was discharged to [facility] 3/8 with PICC, plan to continue vancomycin and cefepime until 4/4. Vancomycin was stopped earlier due to increasing creatinine.   Patient presented to ED with abd pain, found to have Cr of 5.68 and concerns for non-draining foley cath. After removing foreign body, cath yielded 2.5L urine. Patient also noted to have K in excess of 6.4, given kayexelate. ID was consulted and recommended change to daptomycin and cefepime. Blood cultures grew C. glabrata, so PICC was removed for line holiday and anidulafungin was started. Neurology was consulted and recommended deferral of MS treatment to the outpatient setting as there is no evidence of an acute flare. Nephrology was consulted with progressive oliguric renal failure. HD cath placed 4/5 and HD started.     Assessment & Plan:  Quadriplegia Cervical abscess vs Cervical hematoma vs infected cervical hematoma -Continue dapto, cefepime, end date 4/14   Acute renal failure Poor urine output yesterday despite Lasix 80 TID -Consult Neph, appreciate cares -Continue palliative care  Candidemia -Continue anidulafungin   Thrombocytopenia Worsening.     No bleeding.  Started to drift down from baseline 200K (to 140s, 120s in mid March before Eraxis and dapto/cefepime  were started), but only dropped below 100K on 3/31 after starting dapto (anidulafungin started 4/2).  No heparins in last week.  LDH up, smear sent to path.  Coags, fibrinogen WNL, no systemic signs.  Hep C and HIV neg.  Suspect dapto and/or Eraxis -Consult Heme -Trend CBC  Type 2 diabetes, with nephropathy, on long-term insulin -Continue glargine, SSI    Atrial fibrillation paroxysmal CHADS2Vasc 1.  Anticoagulation contraindicated. -Continue amiodarone, diltiazem, metoprolol    Adjustment disorder versus depression -Continue mirtazapine  Multiple sclerosis -Follow up with Neurology as outpatient.  Other medications -Continue Glucerna  Hyponatremia Mild  Anemia In context of severe illness. Stable.     DVT prophylaxis: SCDs Code Status: FULL Family Communication: None present MDM and disposition Plan: Below labs were and imaging reports were reviewed and summarized above.  The patient was admitted with quadriplegia from fluid collection and spine and renal failure from vancomycin, now with persistent oliguric renal failure, concern for ESRD.  Now with worsening thrombocytopenia, oncology consulted.      Consultants:   Ophtho  Neurology  Psychiatry  Nephrology  Infectious disease  Procedures:   Echocardiogram 4/4 Study Conclusions  - Left ventricle: The cavity size was normal. Wall thickness was   increased in a pattern of moderate LVH. Indeterminant diastolic   function (atrial fibrillation). The estimated ejection fraction   was 55%. Although no diagnostic regional wall motion abnormality   was identified, this possibility cannot be completely excluded on   the basis of this study. - Aortic valve: There was no stenosis. - Mitral valve: Mildly calcified annulus. There was no significant   regurgitation. - Right ventricle:  Poorly visualized. Probably normal size and   systolic function. - Pulmonary arteries: No complete TR doppler jet so unable to    estimate PA systolic pressure. - Inferior vena cava: The vessel was normal in size. The   respirophasic diameter changes were in the normal range (>= 50%),   consistent with normal central venous pressure.  Impressions:  - Technically difficult study with poor acoustic windows. Normal LV   size with moderate LV hypertrophy. EF 55%. Poorly visualized RV   but probably normal. No significant valvular abnormalities.  Antimicrobials:   Cefepime 3/30 >>  Anidulafungin 4/2 >>  Daptomycin 3/31 >>    Subjective: No new fever, confusion, cough, abdominal pain, gums bleeding, nosebleeds.  Minimal urine output with Lasix yesterday.     Objective: Vitals:   05/12/17 1703 05/12/17 2000 05/12/17 2353 05/13/17 0407  BP: 124/78 (!) 153/79 (!) 156/89 (!) 149/86  Pulse: 64 72 66 64  Resp:  20 20 20   Temp: (!) 97.5 F (36.4 C) 97.9 F (36.6 C) 97.8 F (36.6 C) 97.7 F (36.5 C)  TempSrc: Oral Oral Oral Oral  SpO2: 97% 96% 95% 99%  Weight:    135.2 kg (298 lb)  Height:        Intake/Output Summary (Last 24 hours) at 05/13/2017 0739 Last data filed at 05/13/2017 0500 Gross per 24 hour  Intake 1814 ml  Output 370 ml  Net 1444 ml   Filed Weights   05/10/17 1702 05/12/17 0412 05/13/17 0407  Weight: 109 kg (240 lb 4.8 oz) 132 kg (291 lb 1.6 oz) 135.2 kg (298 lb)    Examination: General appearance:   Adult male, paraplegic, lying in recliner, sleeping, easily arousable HEENT: Anicteric, conjunctivae pink, lids and lashes normal, no nasal deformity, discharge, epistaxis, looks moist, dentition good, no oral lesions, OP moist.   Skin: Warm and dry, no petechiae. Cardiac: Heart rate regular, no murmurs, no lower extremity edema. Respiratory: Respiratory respiratory even, lungs clear without rales or wheezes. Abdomen: Without tenderness to palpation.   MSK: No deformities or effusion in the large joints of the arms or legs bilaterally. Neuro: Sleeping, arousable, EOMI, legs flaccid, arms  weak, grip flaccid.  Sensation diminished on legs and arms.    Psych: Sodium intact and responding to questions, oriented to person place and time, attention normal, judgment and insight good, affect slightly blunted.    Data Reviewed: I have personally reviewed following labs and imaging studies:  CBC: Recent Labs  Lab 05/07/17 1528 05/12/17 0527  WBC 13.7* 13.6*  HGB 9.5* 10.5*  HCT 27.6* 29.8*  MCV 83.1 84.4  PLT 67* 42*   Basic Metabolic Panel: Recent Labs  Lab 05/07/17 1528 05/08/17 0342 05/09/17 0801 05/10/17 0241 05/11/17 0557 05/12/17 0527  NA 129* 134* 133* 132* 133* 134*  K 4.1 4.3 4.5 5.5* 4.1 3.9  CL 90* 97* 98* 98* 100* 98*  CO2 19* 23 19* 20* 21* 23  GLUCOSE 202* 138* 176* 169* 159* 148*  BUN 141* 76* 46* 62* 35* 44*  CREATININE 9.61* 6.05* 4.65* 5.32* 3.46* 4.17*  CALCIUM 7.2* 7.1* 7.2* 7.4* 7.4* 7.5*  PHOS 7.8*  --   --   --   --  4.5   GFR: Estimated Creatinine Clearance: 32.2 mL/min (A) (by C-G formula based on SCr of 4.17 mg/dL (H)). Liver Function Tests: Recent Labs  Lab 05/07/17 1528 05/12/17 0527  ALBUMIN 1.8* 1.8*   No results for input(s): LIPASE, AMYLASE in the last 168 hours. No  results for input(s): AMMONIA in the last 168 hours. Coagulation Profile: Recent Labs  Lab 05/12/17 0957  INR 1.27   Cardiac Enzymes: Recent Labs  Lab 05/07/17 0538 05/11/17 0557  CKTOTAL 176 97   BNP (last 3 results) No results for input(s): PROBNP in the last 8760 hours. HbA1C: No results for input(s): HGBA1C in the last 72 hours. CBG: Recent Labs  Lab 05/12/17 0614 05/12/17 1131 05/12/17 1702 05/12/17 2129 05/13/17 0615  GLUCAP 157* 169* 259* 228* 162*   Lipid Profile: No results for input(s): CHOL, HDL, LDLCALC, TRIG, CHOLHDL, LDLDIRECT in the last 72 hours. Thyroid Function Tests: No results for input(s): TSH, T4TOTAL, FREET4, T3FREE, THYROIDAB in the last 72 hours. Anemia Panel: No results for input(s): VITAMINB12, FOLATE,  FERRITIN, TIBC, IRON, RETICCTPCT in the last 72 hours. Urine analysis:    Component Value Date/Time   COLORURINE YELLOW May 08, 2017 0920   APPEARANCEUR TURBID (A) 08-May-2017 0920   LABSPEC 1.015 05/08/17 0920   PHURINE 6.0 05-08-2017 0920   GLUCOSEU NEGATIVE May 08, 2017 0920   HGBUR LARGE (A) 2017/05/08 0920   BILIRUBINUR NEGATIVE 05-08-17 0920   KETONESUR NEGATIVE May 08, 2017 0920   PROTEINUR 30 (A) 05-08-2017 0920   NITRITE NEGATIVE 05/08/17 0920   LEUKOCYTESUR MODERATE (A) 2017/05/08 0920   Sepsis Labs: @LABRCNTIP (procalcitonin:4,lacticacidven:4)  ) Recent Results (from the past 240 hour(s))  Culture, blood (routine x 2)     Status: None   Collection Time: 05/06/17  2:33 PM  Result Value Ref Range Status   Specimen Description BLOOD LEFT HAND  Final   Special Requests   Final    BOTTLES DRAWN AEROBIC ONLY Blood Culture adequate volume   Culture   Final    NO GROWTH 5 DAYS Performed at [facility], [address] [place], [place] [address]    Report Status 05/11/2017 FINAL  Final  Culture, blood (routine x 2)     Status: None   Collection Time: 05/06/17  2:40 PM  Result Value Ref Range Status   Specimen Description BLOOD LEFT WRIST  Final   Special Requests   Final    BOTTLES DRAWN AEROBIC ONLY Blood Culture adequate volume   Culture   Final    NO GROWTH 5 DAYS Performed at [facility], [address] [place], [place] [address]    Report Status 05/11/2017 FINAL  Final         Radiology Studies: No results found.      Scheduled Meds: . amiodarone  200 mg Oral Daily  . collagenase   Topical Daily  . dexamethasone  3 mg Oral Q8H  . diltiazem  60 mg Oral Q8H  . feeding supplement (GLUCERNA SHAKE)  237 mL Oral BID BM  . furosemide  80 mg Intravenous TID  . guaiFENesin  600 mg Oral BID  . insulin aspart  0-5 Units Subcutaneous QHS  . insulin aspart  0-9 Units Subcutaneous TID WC  . insulin aspart  2 Units Subcutaneous TID WC  . insulin  glargine  12 Units Subcutaneous Daily  . metoprolol tartrate  12.5 mg Oral BID  . mirtazapine  15 mg Oral QHS  . pantoprazole  40 mg Oral Daily   Continuous Infusions: . sodium chloride    . sodium chloride    . anidulafungin Stopped (05/12/17 2310)  . ceFEPime (MAXIPIME) IV Stopped (05/12/17 1728)  . DAPTOmycin (CUBICIN)  IV Stopped (05/12/17 2310)     LOS: 12 days    Time spent: 30  minutes    Alberteen Sam, MD [facility] 05/13/2017, 1:42 PM     Pager [phone] --- please page though AMION:  [email or web address] If 7PM-7AM, please contact night-coverage

## 2017-05-13 NOTE — Consult Note (Signed)
Reason for Referral: Thrombocytopenia  HPI: 52 year old gentleman with recurrent hospitalizations started in February 2019 when he presented with progressive generalized weakness and found to have a cord compression in the cervical spine related to cervical stenosis.  He underwent spinal decompression that was complicated by atrial fibrillation and subsequently developed a spinal hematoma versus abscess.  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  He was discharged in the end of March on cefepime and vancomycin for urosepsis.  On April 11, 2017 his platelet count was 183 and on 04/19/2017 his platelet count was 145 and drifted down to 122 on April 26, 2017.  He was hospitalized again on May 01, 2017 and at that time his platelet count was 119.  He developed acute kidney injury and required hemodialysis.  Beginning his recent hospitalization on March 30 his platelet count was 119 and decreased on March 31 to 98.  His platelet count has been declining since that time and currently at 29 on May 13, 2017.  His cefepime was restarted on May 01, 2017 and was started on Anidulafungin for candidemia around May 03, 2017.  Clinically, he reports no active bleeding or ecchymosis.  He denies any bruising or petechiae.    He does not report any headaches, blurry vision, syncope or seizures. Does not report any fevers, chills or sweats.  Does not report any cough, wheezing or hemoptysis.  Does not report any chest pain, palpitation, orthopnea.  Does not report any nausea, vomiting or abdominal pain.  Does not report any constipation or diarrhea.  Does not report any skeletal complaints.    Does not report frequency, urgency or hematuria.  Does not report any skin rashes or lesions. Does not report any anxiety or depression.  Remaining review of systems is negative.    Past Medical History:  Diagnosis Date  . 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 Diabetes mellitus without complication (HCC)   . Hypertension    . MS (congenital mitral stenosis) 2019  . Persistent atrial fibrillation with rapid ventricular response (HCC) 03/27/2017  :  Past Surgical History:  Procedure Laterality Date  . ANTERIOR CERVICAL DECOMP/DISCECTOMY FUSION N/A 03/25/2017   Procedure: ANTERIOR CERVICAL DECOMPRESSION/DISCECTOMY FUSION CERVICAL FIVE- CERVICAL SIX, CERVICAL SIX- CERVICAL SEVEN, CORPECTOMY;  Surgeon: Lisbeth Renshaw, MD;  Location: [facility] OR;  Service: Neurosurgery;  Laterality: N/A;  . IR FLUORO GUIDE CV LINE RIGHT  05/07/2017  . IR US GUIDE VASC ACCESS RIGHT  05/07/2017  . NOSE SURGERY     AS A CHILD  :   Current Facility-Administered Medications:  .  0.9 %  sodium chloride infusion, 100 mL, Intravenous, PRN, Zetta Bills, MD .  0.9 %  sodium chloride infusion, 100 mL, Intravenous, PRN, Zetta Bills, MD .  acetaminophen (TYLENOL) tablet 650 mg, 650 mg, Oral, Q4H PRN, Alwyn Ren, MD .  alteplase (CATHFLO ACTIVASE) injection 2 mg, 2 mg, Intracatheter, Once PRN, Zetta Bills, MD .  amiodarone (PACERONE) tablet 200 mg, 200 mg, Oral, Daily, Alwyn Ren, MD, 200 mg at 05/13/17 1032 .  anidulafungin (ERAXIS) 100 mg in sodium chloride 0.9 % 100 mL IVPB, 100 mg, Intravenous, Q24H, Veryl Speak, FNP, Stopped at 05/12/17 2310 .  benzonatate (TESSALON) capsule 100 mg, 100 mg, Oral, BID PRN, Tyrone Nine, MD, 100 mg at 05/09/17 1601 .  bisacodyl (DULCOLAX) EC tablet 5 mg, 5 mg, Oral, Daily PRN, Alwyn Ren, MD .  ceFEPIme (MAXIPIME) 1 g in sodium chloride 0.9 % 100 mL IVPB, 1  g, Intravenous, QPC supper, Veryl Speak, FNP, Stopped at 05/12/17 1728 .  collagenase (SANTYL) ointment, , Topical, Daily, Hazeline Junker B, MD .  DAPTOmycin (CUBICIN) 900 mg in sodium chloride 0.9 % IVPB, 900 mg, Intravenous, Q48H, Veryl Speak, FNP, Stopped at 05/12/17 2310 .  dexamethasone (DECADRON) tablet 3 mg, 3 mg, Oral, Q8H, Green, Terri L, RPH, 3 mg at 05/13/17 1437 .  diltiazem (CARDIZEM) tablet 60 mg, 60 mg,  Oral, Q8H, Jerald Kief, MD, 60 mg at 05/13/17 1436 .  feeding supplement (GLUCERNA SHAKE) (GLUCERNA SHAKE) liquid 237 mL, 237 mL, Oral, BID BM, Jerald Kief, MD, 237 mL at 05/13/17 1033 .  furosemide (LASIX) 120 mg in dextrose 5 % 50 mL IVPB, 120 mg, Intravenous, TID, Zetta Bills, MD, Stopped at 05/13/17 1151 .  guaiFENesin (MUCINEX) 12 hr tablet 600 mg, 600 mg, Oral, BID, Hazeline Junker B, MD, 600 mg at 05/13/17 1032 .  heparin injection 1,000 Units, 1,000 Units, Dialysis, PRN, Zetta Bills, MD .  heparin injection 5,000 Units, 5,000 Units, Dialysis, PRN, Zetta Bills, MD .  heparin injection, , Intravenous, PRN, Malachy Moan, MD, 2,800 Units at 05/07/17 1104 .  insulin aspart (novoLOG) injection 0-5 Units, 0-5 Units, Subcutaneous, QHS, Jerald Kief, MD, 2 Units at 05/12/17 2132 .  insulin aspart (novoLOG) injection 0-9 Units, 0-9 Units, Subcutaneous, TID WC, Jerald Kief, MD, 3 Units at 05/13/17 1204 .  insulin aspart (novoLOG) injection 2 Units, 2 Units, Subcutaneous, TID WC, Tyrone Nine, MD, 2 Units at 05/13/17 1203 .  insulin glargine (LANTUS) injection 12 Units, 12 Units, Subcutaneous, Daily, Tyrone Nine, MD, 12 Units at 05/13/17 1033 .  labetalol (NORMODYNE,TRANDATE) injection 5 mg, 5 mg, Intravenous, Q2H PRN, Jerald Kief, MD .  lidocaine (PF) (XYLOCAINE) 1 % injection 5 mL, 5 mL, Intradermal, PRN, Zetta Bills, MD .  lidocaine (PF) (XYLOCAINE) 1 % injection, , , PRN, Malachy Moan, MD, 2 mL at 05/07/17 1058 .  lidocaine-prilocaine (EMLA) cream 1 application, 1 application, Topical, PRN, Zetta Bills, MD .  LORazepam (ATIVAN) tablet 0.5 mg, 0.5 mg, Oral, Q6H PRN, Alwyn Ren, MD, 0.5 mg at 05/04/17 0835 .  methocarbamol (ROBAXIN) tablet 500 mg, 500 mg, Oral, Q6H PRN, Alwyn Ren, MD .  metoprolol tartrate (LOPRESSOR) tablet 12.5 mg, 12.5 mg, Oral, BID, Jerald Kief, MD, 12.5 mg at 05/13/17 1032 .  mirtazapine (REMERON) tablet 15 mg, 15 mg, Oral, QHS,  Cherly Beach, DO, 15 mg at 05/12/17 2101 .  ondansetron (ZOFRAN) injection 4 mg, 4 mg, Intravenous, Q6H PRN, Jerald Kief, MD, 4 mg at 05/02/17 1111 .  ondansetron (ZOFRAN) tablet 4 mg, 4 mg, Oral, Q6H PRN, Alwyn Ren, MD, 4 mg at 05/02/17 0522 .  pantoprazole (PROTONIX) EC tablet 40 mg, 40 mg, Oral, Daily, Alwyn Ren, MD, 40 mg at 05/13/17 1032 .  pentafluoroprop-tetrafluoroeth (GEBAUERS) aerosol 1 application, 1 application, Topical, PRN, Zetta Bills, MD .  sodium chloride flush (NS) 0.9 % injection 10-40 mL, 10-40 mL, Intracatheter, PRN, Jerald Kief, MD .  traZODone (DESYREL) tablet 50 mg, 50 mg, Oral, QHS PRN, Alwyn Ren, MD, 50 mg at 05/04/17 2230:  No Known Allergies:  Family History  Problem Relation Age of Onset  . Prostate cancer Father   :  Social History   Socioeconomic History  . Marital status: Single    Spouse name: Not on file  . Number of children: Not on file  .  Years of education: Not on file  . Highest education level: Not on file  Occupational History  . Not on file  Social Needs  . Financial resource strain: Not on file  . Food insecurity:    Worry: Not on file    Inability: Not on file  . Transportation needs:    Medical: Not on file    Non-medical: Not on file  Tobacco Use  . Smoking status: Never Smoker  . Smokeless tobacco: Never Used  Substance and Sexual Activity  . Alcohol use: No    Frequency: Never  . Drug use: No  . Sexual activity: Not on file  Lifestyle  . Physical activity:    Days per week: Not on file    Minutes per session: Not on file  . Stress: Not on file  Relationships  . Social connections:    Talks on phone: Not on file    Gets together: Not on file    Attends religious service: Not on file    Active member of club or organization: Not on file    Attends meetings of clubs or organizations: Not on file    Relationship status: Not on file  . Intimate partner violence:    Fear of  current or ex partner: Not on file    Emotionally abused: Not on file    Physically abused: Not on file    Forced sexual activity: Not on file  Other Topics Concern  . Not on file  Social History Narrative  . Not on file  :  Pertinent items are noted in HPI.  Exam: Blood pressure (!) 134/91, pulse 66, temperature 97.9 F (36.6 C), temperature source Oral, resp. rate 19, height 6\' 4"  (1.93 m), weight 298 lb (135.2 kg), SpO2 96 %. General appearance: alert and cooperative appeared without distress. Head: atraumatic without any abnormalities. Eyes: conjunctivae/corneas clear. PERRL.  Sclera anicteric. Throat: lips, mucosa, and tongue normal; without oral thrush or ulcers. Resp: clear to auscultation bilaterally without rhonchi, wheezes or dullness to percussion. Cardio: regular rate and rhythm, S1, S2 normal, no murmur, click, rub or gallop GI: soft, non-tender; bowel sounds normal; no masses,  no organomegaly Skin: Skin color, texture, turgor normal. No rashes or lesions.  No petechiae noted. Lymph nodes: Cervical, supraclavicular, and axillary nodes normal. Neurologic: Quadriplegic and unable to move any extremities. Musculoskeletal: No joint deformity or effusion.  Recent Labs    05/12/17 0527 05/13/17 0748  WBC 13.6* 11.9*  HGB 10.5* 10.4*  HCT 29.8* 29.2*  PLT 42* 36*   Recent Labs    05/12/17 0527 05/13/17 0747  NA 134* 134*  K 3.9 4.2  CL 98* 100*  CO2 23 21*  GLUCOSE 148* 171*  BUN 44* 56*  CREATININE 4.17* 4.77*  CALCIUM 7.5* 7.3*     Blood smear review: Peripheral smear personally reviewed today and showed no evidence of schistocytes or red cell fragments.  His white cells appeared normal.    Assessment and Plan:   52 year old gentleman with the following issues:    Thrombocytopenia: Was detected starting on May 01, 2017 with a platelet count of 119 and currently platelet count is 36.  He had very mild thrombocytopenia before his discharge from  initial hospitalization on April 19, 2017 with a platelet count of 145.  Etiology of his thrombocytopenia will be difficult to determine but the differential diagnosis includes medication effect versus reactive thrombocytopenia related to sepsis or consumptive process related to his hematoma.  The  timing of his medications do not support cefepime or daptomycin as the offending agents. Anidulafungin is a possibility with a rate of 6% of thrombocytopenia associated with this medication.  Heparin-induced thrombocytopenia will be also a possibility with central venous catheter in place and hemodialysis.  My recommendation is to obtain HIT screening testing and to monitor his platelet count daily.  If his platelet count continued to drop I would consider switching Anidulafungin at that time.  DIC, HUS, TTP are extremely unlikely at this time.  I see no evidence of schistocytes or red cell fragments on his smear.  No need for any transfusion or growth factor support.  Coagulation parameters including fibrinogen, PTT and INR are all within normal range.  I will continue to follow his labs with you and add any other recommendations at this time.  80  minutes was spent with the patient face-to-face today.  More than 50% of time was dedicated to education, answering questions regarding his laboratory findings as well as reviewing his records and medication history.

## 2017-05-13 NOTE — Evaluation (Signed)
Occupational Therapy Evaluation Patient Details Name: Dylan [name] MRN: [id number] DOB: 1966/02/01 Today's Date: 05/13/2017    History of Present Illness Dylan [name] is a 52yo black male who comes to [facility] on 3/30 with ABD pain and hypoglycemia, admitted with cervical abcess v hematoma s/p C five-seven surgery on . 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 He DC to SNF ~3WA with motor weakness C7 down, full sensation intact. He reports graual worsening of manipulative use with smartphone over those three weaks.     Clinical Impression   This 52 y/o male presents with the above. Prior to this admission, pt was receiving therapy services in SNF setting, requiring max-total assist for all mobility and ADL completion. Prior to initial admission in February 2019, pt was independent with ADLs, iADLs and mobility. Pt currently requiring total assist for all mobility and LB ADLs, max-total assist for UB ADLs. Pt with significant bil UE weakness, though demonstrates ability to bring RUE to mouth with increased effort for simulated self-feeding. Pt will benefit from continued acute OT services and recommend continued SNF level therapy services after discharge to maximize his functional use of UEs, safety and independence with ADLs and mobility.    Follow Up Recommendations  SNF;Supervision/Assistance - 24 hour    Equipment Recommendations  Other (comment)(TBD in next venue )           Precautions / Restrictions Precautions Precautions: Fall;Cervical Restrictions Weight Bearing Restrictions: No      Mobility Bed Mobility               General bed mobility comments: Pt OOB in chair upon arrival (use of [employer] with nursing staff for this transfer)   Transfers                 General transfer comment: NT due to pt currently  total assist +2/3 for all mobility                                                ADL either performed or assessed with clinical judgement   ADL                                         General ADL Comments: pt currently requiring total assist for all ADL completion; encouraged pt to continue moving UEs within available range and to continue working on bringing RUE to mouth for self-feeding (pt currently able to demonstrate this motion); encouraged pt to actively participate in self-feeding task vs completing task dependently     Vision Baseline Vision/History: Wears glasses Wears Glasses: Reading only                  Pertinent Vitals/Pain Pain Assessment: No/denies pain     Hand Dominance Right   Extremity/Trunk Assessment Upper Extremity Assessment Upper Extremity Assessment: RUE deficits/detail;LUE deficits/detail RUE Deficits / Details: pt demonstrates shoulder shrug, elbow flexion grossly 3-/5; shoulder 2-/5; able to demonstrate digit flexion with increased effort, though very minimal grip strength (RUE ROM>L UE); PROM WFL  RUE Sensation: decreased proprioception RUE Coordination: decreased fine motor;decreased gross motor LUE Deficits / Details:  pt demonstrates shoulder shrug, elbow flexion grossly 2-/5; shoulder 2-/5; PROM WFL LUE Sensation: decreased proprioception LUE Coordination: decreased fine motor;decreased gross motor   Lower Extremity Assessment Lower Extremity Assessment: Defer to PT evaluation       Communication Communication Communication: No difficulties   Cognition Arousal/Alertness: Awake/alert Behavior During Therapy: WFL for tasks assessed/performed Overall Cognitive Status: Impaired/Different from baseline Area of Impairment: Orientation                 Orientation Level: Disoriented to;Place             General Comments: pt requires increased time to recall that he is in the hospital, able  to state he is in [place], [place]                    Home Living Family/patient expects to be discharged to:: Skilled nursing facility                                        Prior Functioning/Environment Level of Independence: Needs assistance  Gait / Transfers Assistance Needed: prior to this admission, pt was quadriplegic, intermittent motor use from C6 up; prior to initial admission in February, pt was independent with ADLs, iADLs and mobility ADL's / Homemaking Assistance Needed: Pt reports receiving total A with feeding, dressing, bathing, toiletting.             OT Problem List: Decreased strength;Impaired balance (sitting and/or standing);Decreased activity tolerance;Impaired UE functional use;Decreased range of motion      OT Treatment/Interventions: Self-care/ADL training;DME and/or AE instruction;Therapeutic activities;Balance training;Therapeutic exercise;Patient/family education    OT Goals(Current goals can be found in the care plan section) Acute Rehab OT Goals Patient Stated Goal: to be able to use his hands  OT Goal Formulation: With patient Time For Goal Achievement: 05/27/17 Potential to Achieve Goals: Good  OT Frequency: Min 2X/week                             AM-PAC PT "6 Clicks" Daily Activity     Outcome Measure Help from another person eating meals?: A Lot Help from another person taking care of personal grooming?: Total Help from another person toileting, which includes using toliet, bedpan, or urinal?: Total Help from another person bathing (including washing, rinsing, drying)?: Total Help from another person to put on and taking off regular upper body clothing?: Total Help from another person to put on and taking off regular lower body clothing?: Total 6 Click Score: 7   End of Session Nurse Communication: Mobility status  Activity Tolerance: Patient tolerated treatment well Patient left: in chair;with call  bell/phone within reach  OT Visit Diagnosis: Muscle weakness (generalized) (M62.81);Other symptoms and signs involving the nervous system (R29.898)                Time: 4081-4481 OT Time Calculation (min): 20 min Charges:  OT General Charges $OT Visit: 1 Visit OT Evaluation $OT Eval Moderate Complexity: 1 Mod G-Codes:     Marcy Siren, OT Pager [phone] 05/13/2017   Orlando Penner 05/13/2017, 3:44 PM

## 2017-05-13 NOTE — Progress Notes (Signed)
Patient ID: Dylan [name], male   DOB: 1965-02-06, 52 y.o.   MRN: [id number] [facility] Progress Note   Assessment/ Plan:   1. Acute kidney injury: Suspected secondary to nephrotoxic ATN from supratherapeutic vancomycin levels.  Oliguric overnight without significant response to furosemide-no renal recovery as yet however without acute indications for hemodialysis today, will continue to monitor and attempt higher dose of furosemide today.  He does not have any uremic symptoms or signs and volume status appears acceptable. 2. Atrial fibrillation: Initially with rapid ventricular response that is now rate controlled on diltiazem/metoprolol and amiodarone. 3. Candidemia: On Anidulafungin as recommended by infectious disease-stop date of 4/16 based on repeat cultures. 4.  Cervical abscess versus hematoma: On empiric intravenous daptomycin and cefepime 5.  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  Urinary retention: Secondary to blockage of Foley catheter-underwent exchange by urology.  Subjective:   Denies any active complaints at this time.  Denies any nausea, dysgeusia or abnormal limb jerking movements.   Objective:   BP 127/75 (BP Location: Left Arm)   Pulse 70   Temp (!) 97.5 F (36.4 C) (Oral)   Resp 20   Ht 6\' 4"  (1.93 m)   Wt 135.2 kg (298 lb)   SpO2 94%   BMI 36.27 kg/m   Intake/Output Summary (Last 24 hours) at 05/13/2017 0919 Last data filed at 05/13/2017 0500 Gross per 24 hour  Intake 1574 ml  Output 370 ml  Net 1204 ml   Weight change: 3.13 kg (6 lb 14.4 oz)  Physical Exam: Gen: Resting comfortably in bed CVS: Pulse regular rhythm, normal rate, S1 and S2 normal Resp: Coarse breath sounds bilaterally-no distinct rales or rhonchi Abd: Soft, obese, nontender Ext: Trace-1+ ankle edema  Imaging: No results found.  Labs: BMET Recent Labs  Lab 05/07/17 1528 05/08/17 0342 05/09/17 0801  05/10/17 0241 05/11/17 0557 05/12/17 0527 05/13/17 0747  NA 129* 134* 133* 132* 133* 134* 134*  K 4.1 4.3 4.5 5.5* 4.1 3.9 4.2  CL 90* 97* 98* 98* 100* 98* 100*  CO2 19* 23 19* 20* 21* 23 21*  GLUCOSE 202* 138* 176* 169* 159* 148* 171*  BUN 141* 76* 46* 62* 35* 44* 56*  CREATININE 9.61* 6.05* 4.65* 5.32* 3.46* 4.17* 4.77*  CALCIUM 7.2* 7.1* 7.2* 7.4* 7.4* 7.5* 7.3*  PHOS 7.8*  --   --   --   --  4.5 5.1*   CBC Recent Labs  Lab 05/07/17 1528 05/12/17 0527 05/13/17 0748  WBC 13.7* 13.6* 11.9*  HGB 9.5* 10.5* 10.4*  HCT 27.6* 29.8* 29.2*  MCV 83.1 84.4 84.4  PLT 67* 42* 36*    Medications:    . amiodarone  200 mg Oral Daily  . collagenase   Topical Daily  . dexamethasone  3 mg Oral Q8H  . diltiazem  60 mg Oral Q8H  . feeding supplement (GLUCERNA SHAKE)  237 mL Oral BID BM  . furosemide  80 mg Intravenous TID  . guaiFENesin  600 mg Oral BID  . insulin aspart  0-5 Units Subcutaneous QHS  . insulin aspart  0-9 Units Subcutaneous TID WC  . insulin aspart  2 Units Subcutaneous TID WC  . insulin glargine  12 Units Subcutaneous Daily  . metoprolol tartrate  12.5 mg Oral BID  . mirtazapine  15 mg Oral QHS  . pantoprazole  40 mg Oral Daily   Zetta Bills, MD 05/13/2017, 9:19 AM

## 2017-05-14 DIAGNOSIS — Z7189 Other specified counseling: Secondary | ICD-10-CM

## 2017-05-14 DIAGNOSIS — Z515 Encounter for palliative care: Secondary | ICD-10-CM

## 2017-05-14 LAB — GLUCOSE, CAPILLARY
GLUCOSE-CAPILLARY: 191 mg/dL — AB (ref 65–99)
GLUCOSE-CAPILLARY: 133 mg/dL — AB (ref 65–99)
Glucose-Capillary: 178 mg/dL — ABNORMAL HIGH (ref 65–99)

## 2017-05-14 LAB — CBC
HCT: 29.2 % — ABNORMAL LOW (ref 39.0–52.0)
Hemoglobin: 10.2 g/dL — ABNORMAL LOW (ref 13.0–17.0)
MCH: 29.3 pg (ref 26.0–34.0)
MCHC: 34.9 g/dL (ref 30.0–36.0)
MCV: 83.9 fL (ref 78.0–100.0)
PLATELETS: 30 10*3/uL — AB (ref 150–400)
RBC: 3.48 MIL/uL — AB (ref 4.22–5.81)
RDW: 14.2 % (ref 11.5–15.5)
WBC: 13.4 10*3/uL — AB (ref 4.0–10.5)

## 2017-05-14 LAB — RENAL FUNCTION PANEL
ANION GAP: 14 (ref 5–15)
Albumin: 2 g/dL — ABNORMAL LOW (ref 3.5–5.0)
BUN: 66 mg/dL — ABNORMAL HIGH (ref 6–20)
CO2: 20 mmol/L — ABNORMAL LOW (ref 22–32)
Calcium: 7.2 mg/dL — ABNORMAL LOW (ref 8.9–10.3)
Chloride: 99 mmol/L — ABNORMAL LOW (ref 101–111)
Creatinine, Ser: 5.3 mg/dL — ABNORMAL HIGH (ref 0.61–1.24)
GFR calc non Af Amer: 12 mL/min — ABNORMAL LOW (ref >60)
GFR, EST AFRICAN AMERICAN: 13 mL/min — AB (ref >60)
Glucose, Bld: 181 mg/dL — ABNORMAL HIGH (ref 65–99)
Phosphorus: 5.3 mg/dL — ABNORMAL HIGH (ref 2.5–4.6)
Potassium: 4.3 mmol/L (ref 3.5–5.1)
SODIUM: 133 mmol/L — AB (ref 135–145)

## 2017-05-14 LAB — CK: Total CK: 92 U/L (ref 49–397)

## 2017-05-14 MED ORDER — SODIUM CHLORIDE 0.9 % IV SOLN
250.0000 mL | INTRAVENOUS | Status: DC | PRN
  Administered 2017-05-16: 250 mL via INTRAVENOUS

## 2017-05-14 MED ORDER — SODIUM CHLORIDE 0.9% FLUSH
3.0000 mL | Freq: Two times a day (BID) | INTRAVENOUS | Status: DC
  Administered 2017-05-14 – 2017-05-16 (×4): 3 mL via INTRAVENOUS

## 2017-05-14 MED ORDER — SODIUM CHLORIDE 0.9% FLUSH
3.0000 mL | INTRAVENOUS | Status: DC | PRN
  Administered 2017-05-15: 3 mL via INTRAVENOUS
  Filled 2017-05-14: qty 3

## 2017-05-14 MED ORDER — MIRTAZAPINE 15 MG PO TABS
30.0000 mg | ORAL_TABLET | Freq: Every day | ORAL | Status: DC
  Administered 2017-05-14 – 2017-06-10 (×25): 30 mg via ORAL
  Filled 2017-05-14: qty 1
  Filled 2017-05-14: qty 2
  Filled 2017-05-14: qty 1
  Filled 2017-05-14: qty 2
  Filled 2017-05-14: qty 1
  Filled 2017-05-14 (×8): qty 2
  Filled 2017-05-14: qty 1
  Filled 2017-05-14 (×17): qty 2

## 2017-05-14 NOTE — Progress Notes (Signed)
Pharmacy Antibiotic Note  Dylan [name] is a 52 y.o. male admitted on 05-May-2017 with spinal abscess. Patient began treatment with vancomycin and cefepime on 04/09/17 with plan to continue through 05/16/17.  Patient has worsening renal function so vancomycin was switched to Cubicin.  He as fungemia and is on Eraxis.  Patient new to HD with intermittent sessions. He has been oliguric the past two days so today he is being scheduled for HD.  Afebrile and last WBC was 13.4.  CK is now back down following HD.   Plan:  Cubicin to 900mg  IV Q48H (~ 8 mg/kg TBW) Cefepime 1gm IV Q24H CK qFri  F/U plan for HD (will keep Cubicin at Q48H interval until stable on HD schedule, then switch to qHD)   Height: 6\' 4"  (193 cm) Weight: 295 lb (133.8 kg) IBW/kg (Calculated) : 86.8  Temp (24hrs), Avg:97.7 F (36.5 C), Min:97.5 F (36.4 C), Max:97.9 F (36.6 C)  Recent Labs  Lab 05/07/17 1528  05/10/17 0241 05/11/17 0557 05/12/17 0527 05/13/17 0747 05/13/17 0748 05/14/17 0824  WBC 13.7*  --   --   --  13.6*  --  11.9* 13.4*  CREATININE 9.61*   < > 5.32* 3.46* 4.17* 4.77*  --  5.30*   < > = values in this interval not displayed.    Estimated Creatinine Clearance: 25.2 mL/min (A) (by C-G formula based on SCr of 5.3 mg/dL (H)).    No Known Allergies    Vanc 3/8 PTA >> 3/29 Cefepime 3/28 PTA >> (4/14) Cubicin 3/31 >> (4/14) Eraxis 4/2 >>  3/31 VR = 57 mcg/mL (500mg  q12) 3/30 CK = 36 (prior to Cubicin) 4/5 CK = 176 4/12 CK = 92  3/6 BCx: NGF 3/30 UCx - negative 3/30 BCx PICC line: C.glabrata 4/4 BCx - NGTD   Ruben Im, PharmD Clinical Pharmacist 05/14/2017 11:36 AM

## 2017-05-14 NOTE — Consult Note (Signed)
Consultation Note Date: 05/14/2017   Patient Name: Dylan [name]  DOB: 10/29/1965  MRN: [id number]  Age / Sex: 52 y.o., male  PCP: Patient, No Pcp Per Referring Physician: Edwin Dada, *  Reason for Consultation: Establishing goals of care  HPI/Patient Profile: 52 y.o. male  with past medical history of DM and obesity admitted on 04/30/2017 with abdominal pain. Recently admitted to hospital 03/21/08-04/10/17 d/t cervical stenosis. S/p C6 corpectomy on 5/62/56 complicated by post op a fib, started on eliquis and subsequently developed complete motor quadriplegia secondary to hematoma vs abscess. He was discharged to SNF on IV vancomycin d/t complicated UTI and possible cervical abscess. Vancomycin was stopped d/t rising creatinine.  Creatinine was 5.68 during this admission. Since admission pt has experienced progressive oliguric renal failure. Hemodialysis started. Hospital course further complicated with candidemia and worsening thrombocytopenia.  PMT consulted for [place].  Clinical Assessment and Goals of Care: I have reviewed medical records including EPIC notes, labs and imaging, received report from Dr. Loleta Books, assessed the patient and then met at the bedside  to discuss diagnosis prognosis, GOC, EOL wishes, disposition and options.  Multiple attempts to meet w/patient to discuss goals of care - each time was interrupted by different circumstances. Finally met w/ patient during hemodialysis session - not ideal for [place] conversation. Pt drowsy during conversation.  I introduced Palliative Medicine as specialized medical care for people living with serious illness. It focuses on providing relief from the symptoms and stress of a serious illness. The goal is to improve quality of life for both the patient and the family.  We discussed a brief life review of the patient. 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    We discussed their current illness and what it means in the larger context of their on-going co-morbidities. Specifically, we discussed his renal disease and potential need for long-term hemodialysis. He tells me if this is recommended it is what he wants.   Advanced directives, concepts specific to code status, artifical feeding and hydration, and rehospitalization were considered and discussed. I reviewed advance directives that patient had completed in March with patient. He confirms that his cousin, Carlyon Shadow, is his 28 and his sister, Tressia Miners is second HCPOA. We discussed during last admission he elected to be DNR and now he is FULL code. He tells me he did not realize he was ever a DNR and those were not his wishes.   As previously stated, our conversation was not in an ideal setting. Also, patient was drowsy during conversation. Although he was clear about his [place] for full scope treatment, repeat conversation may be beneficial.   Questions and concerns were addressed.   Primary Decision Maker PATIENT    SUMMARY OF RECOMMENDATIONS   Full code/full scope treatment Living will in chart and confirmed w/patient  PMT will follow up next week if patient still here to reattempt [place] conversation in different environment/when pt more alert  Code Status/Advance Care Planning:  Full code   Symptom Management:   Per primary team  Palliative Prophylaxis:   Aspiration, Bowel Regimen, Delirium Protocol, Frequent Pain Assessment, Oral Care and Turn Reposition  Additional Recommendations (Limitations, Scope, Preferences):  Full Scope Treatment  Psycho-social/Spiritual:   Desire for further Chaplaincy support:not assessed  Additional Recommendations: Referral to Community Resources   Prognosis:   Unable to determine  Discharge Planning: SNF      Primary Diagnoses: Present on Admission: .  AKI (acute kidney injury) ([place]) . Uncontrolled  diabetes mellitus type 2 without complications ([place]) . Morbid obesity ([place]) . Hypertension . Paroxysmal atrial fibrillation (H[facility]) . Pressure injury of skin . Hyperkalemia . Adjustment reaction with anxiety and depression . Insomnia . Multiple sclerosis exacerbation ([place]) . Quadriplegia ([place]) . Hematoma . Coronary artery disease . Atherosclerotic peripheral vascular disease ([place])   I have reviewed the medical record, interviewed the patient and family, and examined the patient. The following aspects are pertinent.  Past Medical History:  Diagnosis Date  . 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 Diabetes mellitus without complication ([place])   . Hypertension   . MS (congenital mitral stenosis) 2019  . Persistent atrial fibrillation with rapid ventricular response ([place]) 03/27/2017   Social History   Socioeconomic History  . Marital status: Single    Spouse name: Not on file  . Number of children: Not on file  . Years of education: Not on file  . Highest education level: Not on file  Occupational History  . Not on file  Social Needs  . Financial resource strain: Not on file  . Food insecurity:    Worry: Not on file    Inability: Not on file  . Transportation needs:    Medical: Not on file    Non-medical: Not on file  Tobacco Use  . Smoking status: Never Smoker  . Smokeless tobacco: Never Used  Substance and Sexual Activity  . Alcohol use: No    Frequency: Never  . Drug use: No  . Sexual activity: Not on file  Lifestyle  . Physical activity:    Days per week: Not on file    Minutes per session: Not on file  . Stress: Not on file  Relationships  . Social connections:    Talks on phone: Not on file    Gets together: Not on file    Attends religious service: Not on file    Active member of club or organization: Not on file    Attends meetings of clubs or organizations: Not on file    Relationship status: Not on file  Other Topics Concern  . Not on file  Social History  Narrative  . Not on file   Family History  Problem Relation Age of Onset  . Prostate cancer Father    Scheduled Meds: . amiodarone  200 mg Oral Daily  . collagenase   Topical Daily  . dexamethasone  3 mg Oral Q8H  . diltiazem  60 mg Oral Q8H  . feeding supplement (GLUCERNA SHAKE)  237 mL Oral BID BM  . guaiFENesin  600 mg Oral BID  . insulin aspart  0-5 Units Subcutaneous QHS  . insulin aspart  0-9 Units Subcutaneous TID WC  . insulin aspart  2 Units Subcutaneous TID WC  . insulin glargine  12 Units Subcutaneous Daily  . metoprolol tartrate  12.5 mg Oral BID  . mirtazapine  15 mg Oral QHS  . pantoprazole  40 mg Oral Daily  . sodium chloride flush  3 mL Intravenous Q12H   Continuous Infusions: . sodium chloride    . sodium chloride    . sodium chloride    . anidulafungin Stopped (05/13/17 2314)  . ceFEPime (MAXIPIME) IV Stopped (05/13/17 1851)  . DAPTOmycin (CUBICIN)  IV Stopped (05/12/17 2310)   PRN Meds:.sodium chloride, sodium chloride, sodium chloride, acetaminophen, alteplase, benzonatate, bisacodyl, heparin, heparin, heparin, labetalol, lidocaine (PF), lidocaine (PF), lidocaine-prilocaine, LORazepam, methocarbamol, ondansetron (ZOFRAN) IV, ondansetron, pentafluoroprop-tetrafluoroeth, sodium chloride  flush, sodium chloride flush, traZODone No Known Allergies Review of Systems  Constitutional: Positive for activity change.  Respiratory: Negative.   Cardiovascular: Negative.     Physical Exam  Constitutional: He is oriented to person, place, and time. No distress.  HENT:  Head: Normocephalic and atraumatic.  Cardiovascular: Normal rate and regular rhythm.  Pulmonary/Chest: No accessory muscle usage. No respiratory distress.  Abdominal: Soft.  Neurological: He is oriented to person, place, and time.  drowsy  Skin: Skin is warm and dry.    Vital Signs: BP (!) 146/90 (BP Location: Left Arm)   Pulse 64   Temp (!) 97.5 F (36.4 C) (Oral)   Resp 18   Ht 6' 4"  (1.93  m)   Wt 133.8 kg (295 lb)   SpO2 (!) 84%   BMI 35.91 kg/m  Pain Scale: 0-10   Pain Score: 0-No pain   SpO2: SpO2: (!) 84 % O2 Device:SpO2: (!) 84 % O2 Flow Rate: .   IO: Intake/output summary:   Intake/Output Summary (Last 24 hours) at 05/14/2017 1334 Last data filed at 05/13/2017 1838 Gross per 24 hour  Intake 750 ml  Output 0 ml  Net 750 ml    LBM: Last BM Date: 05/13/17 Baseline Weight: Weight: 111.6 kg (246 lb) Most recent weight: Weight: 133.8 kg (295 lb)     Palliative Assessment/Data: PPS 30%     Time In: 1340 Time Out: 1430 Time Total: 50 minutes Greater than 50%  of this time was spent counseling and coordinating care related to the above assessment and plan.  Juel Burrow, DNP, AGNP-C Palliative Medicine Team [phone]

## 2017-05-14 NOTE — Plan of Care (Signed)
  Problem: Clinical Measurements: Goal: Respiratory complications will improve Outcome: Progressing Goal: Cardiovascular complication will be avoided Outcome: Progressing   Problem: Nutrition: Goal: Adequate nutrition will be maintained Outcome: Progressing   

## 2017-05-14 NOTE — Progress Notes (Signed)
CSW following for discharge planning needs. CSW checked in with Morrie Sheldon at Hemodialysis about patient's potential clipping options, if he needs long term dialysis moving forward. CSW explained patient's mobility concerns and questioned whether any local outpatient facilities would be able to accommodate the patient. Some of the local outpatient dialysis facilities have a hoyer lift available to assist with patient's getting into the chair, so he could potentially be clipped locally if need be. If patient needs to be clipped to an outpatient dialysis facility, the team will be notified and they will actually begin the search for any available spots for the patient; nothing to be done until then.  CSW will continue to follow.  Blenda Nicely, [place] Clinical Social Worker [phone]

## 2017-05-14 NOTE — Progress Notes (Signed)
PROGRESS NOTE    Dylan [name]  ZOX:[id number] DOB: Jul 29, 1965 DOA: 04/30/2017 PCP: Patient, No Pcp Per      Brief Narrative:  Dylan [name] is a 52 y.o. 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 abscess seen on MRI. He was also diagnosed with MS at that time. Neurosurgery felt the risks greatly outweighed benefits of repeat exploratory surgery. He was started on IV antibiotics due to complicated UTI, possible cervical abscess and was discharged to [facility] 3/8 with PICC, plan to continue vancomycin and cefepime until 4/4. Vancomycin was stopped earlier due to increasing creatinine.   Patient presented to ED with abd pain, found to have Cr of 5.68 and concerns for non-draining foley cath. After removing foreign body, cath yielded 2.5L urine. Patient also noted to have K in excess of 6.4, given kayexelate. ID was consulted and recommended change to daptomycin and cefepime. Blood cultures grew C. glabrata, so PICC was removed for line holiday and anidulafungin was started. Neurology was consulted and recommended deferral of MS treatment to the outpatient setting as there is no evidence of an acute flare. Nephrology was consulted with progressive oliguric renal failure. HD cath placed 4/5 and HD started.     Assessment & Plan:  Quadriplegia Cervical abscess vs Cervical hematoma vs infected cervical hematoma Continue daptomycin, cefepime, and date 4/14   Acute renal failure No urine output yesterday. -Consult nephrology, appreciate cares   Candidemia -Stop anidulafungin   Thrombocytopenia Worsening.     No bleeding.  Started to drift down from baseline 200K (to 140s, 120s in mid March before Eraxis and dapto/cefepime were started), but only dropped below 100K  on 3/31 after starting dapto (anidulafungin started 4/2).  No heparins in last week.  LDH up, smear sent to path.  Coags, fibrinogen WNL, no systemic signs.  Hep C and HIV neg.  Suspect dapto and/or Eraxis -Trend CBC -Stop anidulafungin  Type 2 diabetes, with nephropathy, on long-term insulin Continue Lantus, SSI  Atrial fibrillation paroxysmal CHADS2Vasc 1.  Anticoagulation contraindicated. Continue amiodarone, diltiazem, metoprolol  Adjustment disorder versus depression -Increase mirtazapine  Multiple sclerosis -Follow up with Neurology as outpatient.  Other medications -Continue Glucerna  Hyponatremia Mild  Anemia In context of severe illness. Stable.     DVT prophylaxis: SCDs Code Status: FULL Family Communication: None present MDM and disposition Plan: The below labs and imaging reports were reviewed and summarized above.  Patient is admitted with quadriplegia from fluid collection spine and renal failure in the setting of vancomycin.  Now with persistent oliguric renal failure, concern for ESRD.  Worsening thrombopenia, oncology consulted, will reconsult infectious disease.     Consultants:   Ophtho  Neurology  Psychiatry  Nephrology  Infectious disease  Procedures:   Echocardiogram 4/4 Study Conclusions  - Left ventricle: The cavity size was normal. Wall thickness was   increased in a pattern of moderate LVH. Indeterminant diastolic   function (atrial fibrillation). The estimated ejection fraction   was 55%. Although no diagnostic regional wall motion abnormality   was identified, this possibility cannot be completely excluded on   the basis of this study. - Aortic valve: There was no stenosis. - Mitral valve: Mildly calcified annulus. There was no significant   regurgitation. - Right ventricle: Poorly visualized. Probably normal size and  systolic function. - Pulmonary arteries: No complete TR doppler jet so unable to   estimate PA systolic  pressure. - Inferior vena cava: The vessel was normal in size. The   respirophasic diameter changes were in the normal range (>= 50%),   consistent with normal central venous pressure.  Impressions:  - Technically difficult study with poor acoustic windows. Normal LV   size with moderate LV hypertrophy. EF 55%. Poorly visualized RV   but probably normal. No significant valvular abnormalities.  Antimicrobials:   Cefepime 3/30 >>  Anidulafungin 4/2 >>  Daptomycin 3/31 >>    Subjective: Depressed today, no new gums bleeding, nosebleeds, fever.  No chest pain, abdominal pain.  Objective: Vitals:   05/14/17 1615 05/14/17 1630 05/14/17 1700 05/14/17 1732  BP: (!) 87/62 98/72 100/68 100/75  Pulse: 65 62 62 61  Resp: 18 18 18 18   Temp:    (!) 97.5 F (36.4 C)  TempSrc:    Oral  SpO2: 92% 90% 91% 91%  Weight:      Height:        Intake/Output Summary (Last 24 hours) at 05/14/2017 1743 Last data filed at 05/14/2017 1732 Gross per 24 hour  Intake 390 ml  Output 1003 ml  Net -613 ml   Filed Weights   05/14/17 0429  Weight: 133.8 kg (295 lb)    Examination: General appearance:   This adult male, paraplegic, lying in chair, easily arousable, interactive HEENT: Anicteric, conjunctivae pink, lids and lashes normal, no nasal deformity, discharge, epistaxis, looks moist, dentition good, no oral lesions, OP moist.   Skin: Skin warm and dry, no suspicious rashes or lesions or induration or redness cardiac: Rate regular, no murmurs, no lower extremity edema Respiratory: Respirations unlabored, lungs clear without wheezes or rales. Abdomen: Abdomen without tenderness to palpation,  MSK: No deformities or effusion in the large joints of the arms or legs bilaterally. Neuro: Lying in bed, legs flaccid, arms weak, interactive.  Sensation diminished.Marland Kitchen    Psych: During intact, responding to questions, oriented to person place and time, attention normal, judgment and insight normal,  affect blunted.    Data Reviewed: I have personally reviewed following labs and imaging studies:  CBC: Recent Labs  Lab 05/12/17 0527 05/13/17 0748 05/14/17 0824  WBC 13.6* 11.9* 13.4*  HGB 10.5* 10.4* 10.2*  HCT 29.8* 29.2* 29.2*  MCV 84.4 84.4 83.9  PLT 42* 36* 30*   Basic Metabolic Panel: Recent Labs  Lab 05/10/17 0241 05/11/17 0557 05/12/17 0527 05/13/17 0747 05/14/17 0824  NA 132* 133* 134* 134* 133*  K 5.5* 4.1 3.9 4.2 4.3  CL 98* 100* 98* 100* 99*  CO2 20* 21* 23 21* 20*  GLUCOSE 169* 159* 148* 171* 181*  BUN 62* 35* 44* 56* 66*  CREATININE 5.32* 3.46* 4.17* 4.77* 5.30*  CALCIUM 7.4* 7.4* 7.5* 7.3* 7.2*  PHOS  --   --  4.5 5.1* 5.3*   GFR: Estimated Creatinine Clearance: 25.2 mL/min (A) (by C-G formula based on SCr of 5.3 mg/dL (H)). Liver Function Tests: Recent Labs  Lab 05/12/17 0527 05/13/17 0747 05/14/17 0824  ALBUMIN 1.8* 1.9* 2.0*   No results for input(s): LIPASE, AMYLASE in the last 168 hours. No results for input(s): AMMONIA in the last 168 hours. Coagulation Profile: Recent Labs  Lab 05/12/17 0957  INR 1.27   Cardiac Enzymes: Recent Labs  Lab 05/11/17 0557 05/14/17 0824  CKTOTAL 97 92   BNP (last 3 results) No results for input(s): PROBNP in the  last 8760 hours. HbA1C: No results for input(s): HGBA1C in the last 72 hours. CBG: Recent Labs  Lab 05/13/17 1130 05/13/17 1654 05/13/17 2208 05/14/17 0614 05/14/17 1136  GLUCAP 204* 206* 213* 178* 191*   Lipid Profile: No results for input(s): CHOL, HDL, LDLCALC, TRIG, CHOLHDL, LDLDIRECT in the last 72 hours. Thyroid Function Tests: No results for input(s): TSH, T4TOTAL, FREET4, T3FREE, THYROIDAB in the last 72 hours. Anemia Panel: No results for input(s): VITAMINB12, FOLATE, FERRITIN, TIBC, IRON, RETICCTPCT in the last 72 hours. Urine analysis:    Component Value Date/Time   COLORURINE YELLOW 05-02-17 0920   APPEARANCEUR TURBID (A) 05-02-17 0920   LABSPEC 1.015  05-02-2017 0920   PHURINE 6.0 May 02, 2017 0920   GLUCOSEU NEGATIVE 05/02/2017 0920   HGBUR LARGE (A) May 02, 2017 0920   BILIRUBINUR NEGATIVE May 02, 2017 0920   KETONESUR NEGATIVE 2017/05/02 0920   PROTEINUR 30 (A) 05/02/2017 0920   NITRITE NEGATIVE 05/02/2017 0920   LEUKOCYTESUR MODERATE (A) 05/02/17 0920   Sepsis Labs: @LABRCNTIP (procalcitonin:4,lacticacidven:4)  ) Recent Results (from the past 240 hour(s))  Culture, blood (routine x 2)     Status: None   Collection Time: 05/06/17  2:33 PM  Result Value Ref Range Status   Specimen Description BLOOD LEFT HAND  Final   Special Requests   Final    BOTTLES DRAWN AEROBIC ONLY Blood Culture adequate volume   Culture   Final    NO GROWTH 5 DAYS Performed at [facility], [address], [place], [place] [address]    Report Status 05/11/2017 FINAL  Final  Culture, blood (routine x 2)     Status: None   Collection Time: 05/06/17  2:40 PM  Result Value Ref Range Status   Specimen Description BLOOD LEFT WRIST  Final   Special Requests   Final    BOTTLES DRAWN AEROBIC ONLY Blood Culture adequate volume   Culture   Final    NO GROWTH 5 DAYS Performed at [facility], [address], [place], [place] [address]    Report Status 05/11/2017 FINAL  Final         Radiology Studies: No results found.      Scheduled Meds: . amiodarone  200 mg Oral Daily  . collagenase   Topical Daily  . dexamethasone  3 mg Oral Q8H  . diltiazem  60 mg Oral Q8H  . feeding supplement (GLUCERNA SHAKE)  237 mL Oral BID BM  . guaiFENesin  600 mg Oral BID  . insulin aspart  0-5 Units Subcutaneous QHS  . insulin aspart  0-9 Units Subcutaneous TID WC  . insulin aspart  2 Units Subcutaneous TID WC  . insulin glargine  12 Units Subcutaneous Daily  . metoprolol tartrate  12.5 mg Oral BID  . mirtazapine  15 mg Oral QHS  . pantoprazole  40 mg Oral Daily  . sodium chloride flush  3 mL Intravenous Q12H   Continuous Infusions: . sodium  chloride    . sodium chloride    . sodium chloride    . anidulafungin Stopped (05/13/17 2314)  . ceFEPime (MAXIPIME) IV Stopped (05/13/17 1851)  . DAPTOmycin (CUBICIN)  IV Stopped (05/12/17 2310)     LOS: 13 days    Time spent: 25 minutes    Alberteen Sam, MD [facility] 05/14/2017, 5:43 PM     Pager [phone] --- please page though AMION:  [email or web address] If 7PM-7AM, please contact night-coverage

## 2017-05-14 NOTE — Progress Notes (Signed)
CSW received call from patient's sister, Dylan [name], who is working on obtaining power of attorney over the patient's finances. CSW explained that she will have to go through the court system for that, the hospital can only help with HCPOA, which she says the family has already done.  Traci discussed how she was researching other facility options for the patient, and was interested in [facility]. CSW explained that the facility wouldn't accept the patient under Medicaid, that he would also have to have some other insurance as well. Traci requested information on other facility options available that would take the patient under Medicaid. CSW to email patient's sister with facilities that would accept the patient, as he does not want to return to [place].  CSW will continue to follow.  Blenda Nicely, [place] Clinical Social Worker [phone]

## 2017-05-14 NOTE — Procedures (Signed)
Patient was seen on dialysis and the procedure was supervised.  BFR 400  Via catheter BP is  127/81.   Patient appears to be tolerating treatment well  Juliya Magill A 05/14/2017

## 2017-05-14 NOTE — Progress Notes (Signed)
Inpatient Diabetes Program Recommendations  AACE/ADA: New Consensus Statement on Inpatient Glycemic Control (2015)  Target Ranges:  Prepandial:   less than 140 mg/dL      Peak postprandial:   less than 180 mg/dL (1-2 hours)      Critically ill patients:  140 - 180 mg/dL   Lab Results  Component Value Date   GLUCAP 178 (H) 05/14/2017   HGBA1C 8.4 (H) 03/22/2017    Review of Glycemic Control  Post-prandial blood sugars elevated. Increase meal coverage insulin.  Recommendations:  Increase Novolog to 4 units tidwc for meal coverage insulin if pt eating > 50% meal.  Continue to follow.   Thank you. Ailene Ards, RD, LDN, CDE Inpatient Diabetes Coordinator [phone]

## 2017-05-14 NOTE — Progress Notes (Signed)
Patient ID: Dylan [name], male   DOB: 17-Feb-1965, 52 y.o.   MRN: [id number] [facility] Progress Note   Assessment/ Plan:   1. Acute kidney injury: Suspected secondary to nephrotoxic ATN from supratherapeutic vancomycin levels.  Anuric overnight after 2 days of being oliguric in spite of furosemide.  Will order for hemodialysis today with ultrafiltration of 2 L and watch him over the weekend for possible renal recovery.  With his quadriplegia, poor candidate for chronic outpatient hemodialysis with limitations of transfers and ability to sit in recliner. 2. Atrial fibrillation: Initially with rapid ventricular response that is now rate controlled on diltiazem/metoprolol and amiodarone. 3. Candidemia: On Anidulafungin as recommended by infectious disease-stop date of 4/16 based on repeat cultures. 4.  Cervical abscess versus hematoma: On empiric intravenous daptomycin and cefepime 5.  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  Urinary retention: Secondary to blockage of Foley catheter-underwent exchange by urology.  Subjective:   Reports to have had a good night and denies any active complaints.  Objective:   BP 138/88 (BP Location: Left Arm)   Pulse 60   Temp (!) 97.5 F (36.4 C) (Oral)   Resp 18   Ht 6\' 4"  (1.93 m)   Wt 133.8 kg (295 lb)   SpO2 92%   BMI 35.91 kg/m   Intake/Output Summary (Last 24 hours) at 05/14/2017 1009 Last data filed at 05/13/2017 1838 Gross per 24 hour  Intake 1040 ml  Output 0 ml  Net 1040 ml   Weight change: -1.361 kg (-3 lb)  Physical Exam: Gen: Resting comfortably in bed CVS: Pulse regular rhythm, normal rate, S1 and S2 normal Resp: Coarse breath sounds bilaterally-no distinct rales or rhonchi Abd: Soft, obese, nontender Ext: Trace-1+ ankle edema  Imaging: No results found.  Labs: BMET Recent Labs  Lab 05/07/17 1528 05/08/17 0342 05/09/17 0801 05/10/17 0241 05/11/17 0557  05/12/17 0527 05/13/17 0747 05/14/17 0824  NA 129* 134* 133* 132* 133* 134* 134* 133*  K 4.1 4.3 4.5 5.5* 4.1 3.9 4.2 4.3  CL 90* 97* 98* 98* 100* 98* 100* 99*  CO2 19* 23 19* 20* 21* 23 21* 20*  GLUCOSE 202* 138* 176* 169* 159* 148* 171* 181*  BUN 141* 76* 46* 62* 35* 44* 56* 66*  CREATININE 9.61* 6.05* 4.65* 5.32* 3.46* 4.17* 4.77* 5.30*  CALCIUM 7.2* 7.1* 7.2* 7.4* 7.4* 7.5* 7.3* 7.2*  PHOS 7.8*  --   --   --   --  4.5 5.1* 5.3*   CBC Recent Labs  Lab 05/07/17 1528 05/12/17 0527 05/13/17 0748  WBC 13.7* 13.6* 11.9*  HGB 9.5* 10.5* 10.4*  HCT 27.6* 29.8* 29.2*  MCV 83.1 84.4 84.4  PLT 67* 42* 36*    Medications:    . amiodarone  200 mg Oral Daily  . collagenase   Topical Daily  . dexamethasone  3 mg Oral Q8H  . diltiazem  60 mg Oral Q8H  . feeding supplement (GLUCERNA SHAKE)  237 mL Oral BID BM  . guaiFENesin  600 mg Oral BID  . insulin aspart  0-5 Units Subcutaneous QHS  . insulin aspart  0-9 Units Subcutaneous TID WC  . insulin aspart  2 Units Subcutaneous TID WC  . insulin glargine  12 Units Subcutaneous Daily  . metoprolol tartrate  12.5 mg Oral BID  . mirtazapine  15 mg Oral QHS  . pantoprazole  40 mg Oral Daily  . sodium chloride flush  3 mL Intravenous  Q12H   Zetta Bills, MD 05/14/2017, 10:09 AM

## 2017-05-15 ENCOUNTER — Inpatient Hospital Stay (HOSPITAL_COMMUNITY): Payer: Medicaid Other

## 2017-05-15 DIAGNOSIS — T8149XA Infection following a procedure, other surgical site, initial encounter: Secondary | ICD-10-CM

## 2017-05-15 DIAGNOSIS — L89152 Pressure ulcer of sacral region, stage 2: Secondary | ICD-10-CM

## 2017-05-15 LAB — CBC
HCT: 28.6 % — ABNORMAL LOW (ref 39.0–52.0)
HEMOGLOBIN: 9.8 g/dL — AB (ref 13.0–17.0)
MCH: 28.6 pg (ref 26.0–34.0)
MCHC: 34.3 g/dL (ref 30.0–36.0)
MCV: 83.4 fL (ref 78.0–100.0)
PLATELETS: 29 10*3/uL — AB (ref 150–400)
RBC: 3.43 MIL/uL — ABNORMAL LOW (ref 4.22–5.81)
RDW: 14.2 % (ref 11.5–15.5)
WBC: 11 10*3/uL — ABNORMAL HIGH (ref 4.0–10.5)

## 2017-05-15 LAB — RENAL FUNCTION PANEL
ANION GAP: 15 (ref 5–15)
Albumin: 2 g/dL — ABNORMAL LOW (ref 3.5–5.0)
BUN: 43 mg/dL — ABNORMAL HIGH (ref 6–20)
CALCIUM: 7.1 mg/dL — AB (ref 8.9–10.3)
CO2: 19 mmol/L — ABNORMAL LOW (ref 22–32)
CREATININE: 3.96 mg/dL — AB (ref 0.61–1.24)
Chloride: 99 mmol/L — ABNORMAL LOW (ref 101–111)
GFR calc Af Amer: 19 mL/min — ABNORMAL LOW (ref >60)
GFR calc non Af Amer: 16 mL/min — ABNORMAL LOW (ref >60)
GLUCOSE: 135 mg/dL — AB (ref 65–99)
PHOSPHORUS: 4.3 mg/dL (ref 2.5–4.6)
Potassium: 3.4 mmol/L — ABNORMAL LOW (ref 3.5–5.1)
SODIUM: 133 mmol/L — AB (ref 135–145)

## 2017-05-15 LAB — GLUCOSE, CAPILLARY
GLUCOSE-CAPILLARY: 84 mg/dL (ref 65–99)
Glucose-Capillary: 146 mg/dL — ABNORMAL HIGH (ref 65–99)
Glucose-Capillary: 115 mg/dL — ABNORMAL HIGH (ref 65–99)
Glucose-Capillary: 98 mg/dL (ref 65–99)

## 2017-05-15 LAB — HEPARIN INDUCED PLATELET AB (HIT ANTIBODY): HEPARIN INDUCED PLT AB: 0.236 {OD_unit} (ref 0.000–0.400)

## 2017-05-15 MED ORDER — IPRATROPIUM-ALBUTEROL 0.5-2.5 (3) MG/3ML IN SOLN
3.0000 mL | RESPIRATORY_TRACT | Status: DC | PRN
  Administered 2017-06-02: 3 mL via RESPIRATORY_TRACT
  Filled 2017-05-15 (×2): qty 3

## 2017-05-15 NOTE — Progress Notes (Signed)
PROGRESS NOTE    MARCELLES [name]  ZOX:[id number] DOB: Sep 23, 1965 DOA: 2017/05/14 PCP: Patient, No Pcp Per      Brief Narrative:  Dylan [name] is a 52 y.o. 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 abscess seen on MRI. He was also diagnosed with MS at that time. Neurosurgery felt the risks greatly outweighed benefits of repeat exploratory surgery. He was started on IV antibiotics due to complicated UTI, possible cervical abscess and was discharged to [facility] 3/8 with PICC, plan to continue vancomycin and cefepime until 4/4. Vancomycin was stopped earlier due to increasing creatinine.   Patient presented to ED with abd pain, found to have Cr of 5.68 and concerns for non-draining foley cath. After removing foreign body, cath yielded 2.5L urine. Patient also noted to have K in excess of 6.4, given kayexelate. ID was consulted and recommended change to daptomycin and cefepime. Blood cultures grew C. glabrata, so PICC was removed for line holiday and anidulafungin was started. Neurology was consulted and recommended deferral of MS treatment to the outpatient setting as there is no evidence of an acute flare. Nephrology was consulted with progressive oliguric renal failure. HD cath placed 4/5 and HD started.     Assessment & Plan:  Quadriplegia Cervical abscess vs Cervical hematoma vs infected cervical hematoma -Continue daptomycin, cefepime, end date tomorrow   Acute renal failure No urine output documented yesterday the day before. -Consult nephrology, appreciate cares   Candidemia Completed ophthalmology eval.  Had 10 days anidulafungin, now stopped due to thrombocytopenia.   Thrombocytopenia Still worsening, down to 29K. No bleeding.  New petechiae on arms today.  HIT panel negative.  Stopped anidulfungin yesterday.  If further decrease, will discuss thresholds for transfusion.  If beleding, will transfuse.   -Trend CBC  Type 2 diabetes, with nephropathy, on long-term insulin -Continue Lantus, SSI  Atrial fibrillation paroxysmal CHADS2Vasc 1.  Anticoagulation contraindicated.  Had some rapid ventricular rate last night with dyspnea.  Now resolved on its own -Continue amiodarone, diltiazem, metoprolol  Adjustment disorder versus depression -Continue mirtazapine  Multiple sclerosis -Follow up with Neurology as outpatient.  Other medications -Continue Glucerna  Hyponatremia Mild  Anemia In context of severe illness. Stable.     DVT prophylaxis: SCDs Code Status: FULL Family Communication: None present MDM and disposition Plan: The below labs and imaging reports were reviewed and summarized above.  Medications were adjusted as above.  The patient was admitted with quadriplegia from fluid collection around his spine as well as cervical myelopathy from degenerative disc disease, and is now developed renal failure in the setting of vancomycin use.  He is persistently oliguric, concern for ESRD. Worsening thrombus cytopenia, oncology consulted, discussed in [place] function informally with infectious disease yesterday, recommended completing 10-day course, stop Renagel function, no further therapy.         Consultants:   Ophtho  Neurology  Psychiatry  Nephrology  Infectious disease  Procedures:   Echocardiogram 4/4 Study Conclusions  - Left ventricle: The cavity size was normal. Wall thickness was   increased in a pattern of moderate LVH. Indeterminant diastolic   function (atrial fibrillation). The estimated ejection fraction   was 55%. Although no diagnostic regional wall motion abnormality   was identified, this possibility cannot be completely excluded on   the basis  of this study. - Aortic valve: There was no  stenosis. - Mitral valve: Mildly calcified annulus. There was no significant   regurgitation. - Right ventricle: Poorly visualized. Probably normal size and   systolic function. - Pulmonary arteries: No complete TR doppler jet so unable to   estimate PA systolic pressure. - Inferior vena cava: The vessel was normal in size. The   respirophasic diameter changes were in the normal range (>= 50%),   consistent with normal central venous pressure.  Impressions:  - Technically difficult study with poor acoustic windows. Normal LV   size with moderate LV hypertrophy. EF 55%. Poorly visualized RV   but probably normal. No significant valvular abnormalities.  Antimicrobials:   Cefepime 3/30 >>  Anidulafungin 4/2 >>  Daptomycin 3/31 >>    Subjective: Tachypneic overnight, chest discomfort and dyspnea.  Telemetry showed A. fib with rapid ventricular rate.  Was given BiPAP, but by this morning he had converted to sinus rhythm, no longer needed BiPAP.  No new, bleeding, nosebleeds, fever.    Objective: Vitals:   05/15/17 0500 05/15/17 0740 05/15/17 0820 05/15/17 1438  BP:  120/80 120/80 117/83  Pulse:  69 73 87  Resp:  (!) 22    Temp:   98.3 F (36.8 C) 98.9 F (37.2 C)  TempSrc:   Oral Oral  SpO2:  93%    Weight: (!) 141.5 kg (312 lb)     Height:       No intake or output data in the 24 hours ending 05/15/17 1749 Filed Weights   05/14/17 0429 05/15/17 0500  Weight: 133.8 kg (295 lb) (!) 141.5 kg (312 lb)    Examination: General appearance:   Quadriplegic adult male, lying in bed, interactive, depressed HEENT: Anicteric, conjunctival pink, lids and lashes normal, no nasal deformity, discharge, epistaxis.  OP moist, dentition good, no oral lesions.   Skin: Skin is warm and dry, there are nonblanching red macules on the wrists bilaterally  cardiac: RRR, no murmurs, no lower extremity edema Respiratory: Respirations comfortable, lungs clear without wheezes or rales Abdomen:  Soft without tenderness to palpation, no HSM MSK: Flaccid Neuro: Lying in bed, legs flaccid, arms weak.  Sensation diminished on legs, abdomen, arms.    Psych: Sensorium is intact, was threatening to questions, oriented to person place and time, attention normal, judgment and insight normal, affect blunted.    Data Reviewed: I have personally reviewed following labs and imaging studies:  CBC: Recent Labs  Lab 05/12/17 0527 05/13/17 0748 05/14/17 0824 05/15/17 0739  WBC 13.6* 11.9* 13.4* 11.0*  HGB 10.5* 10.4* 10.2* 9.8*  HCT 29.8* 29.2* 29.2* 28.6*  MCV 84.4 84.4 83.9 83.4  PLT 42* 36* 30* 29*   Basic Metabolic Panel: Recent Labs  Lab 05/11/17 0557 05/12/17 0527 05/13/17 0747 05/14/17 0824 05/15/17 0739  NA 133* 134* 134* 133* 133*  K 4.1 3.9 4.2 4.3 3.4*  CL 100* 98* 100* 99* 99*  CO2 21* 23 21* 20* 19*  GLUCOSE 159* 148* 171* 181* 135*  BUN 35* 44* 56* 66* 43*  CREATININE 3.46* 4.17* 4.77* 5.30* 3.96*  CALCIUM 7.4* 7.5* 7.3* 7.2* 7.1*  PHOS  --  4.5 5.1* 5.3* 4.3   GFR: Estimated Creatinine Clearance: 34.7 mL/min (A) (by C-G formula based on SCr of 3.96 mg/dL (H)). Liver Function Tests: Recent Labs  Lab 05/12/17 0527 05/13/17 0747 05/14/17 0824 05/15/17 0739  ALBUMIN 1.8* 1.9* 2.0* 2.0*   No results for input(s): LIPASE, AMYLASE in the last  168 hours. No results for input(s): AMMONIA in the last 168 hours. Coagulation Profile: Recent Labs  Lab 05/12/17 0957  INR 1.27   Cardiac Enzymes: Recent Labs  Lab 05/11/17 0557 05/14/17 0824  CKTOTAL 97 92   BNP (last 3 results) No results for input(s): PROBNP in the last 8760 hours. HbA1C: No results for input(s): HGBA1C in the last 72 hours. CBG: Recent Labs  Lab 05/14/17 1136 05/14/17 2137 05/15/17 0617 05/15/17 1132 05/15/17 1716  GLUCAP 191* 133* 146* 115* 98   Lipid Profile: No results for input(s): CHOL, HDL, LDLCALC, TRIG, CHOLHDL, LDLDIRECT in the last 72 hours. Thyroid Function  Tests: No results for input(s): TSH, T4TOTAL, FREET4, T3FREE, THYROIDAB in the last 72 hours. Anemia Panel: No results for input(s): VITAMINB12, FOLATE, FERRITIN, TIBC, IRON, RETICCTPCT in the last 72 hours. Urine analysis:    Component Value Date/Time   COLORURINE YELLOW 04/02/2017 0920   APPEARANCEUR TURBID (A) 04/19/2017 0920   LABSPEC 1.015 04/09/2017 0920   PHURINE 6.0 04/08/2017 0920   GLUCOSEU NEGATIVE 04/16/2017 0920   HGBUR LARGE (A) 04/22/2017 0920   BILIRUBINUR NEGATIVE 04/07/2017 0920   KETONESUR NEGATIVE 04/19/2017 0920   PROTEINUR 30 (A) 04/17/2017 0920   NITRITE NEGATIVE 04/23/2017 0920   LEUKOCYTESUR MODERATE (A) 04/19/2017 0920   Sepsis Labs: @LABRCNTIP (procalcitonin:4,lacticacidven:4)  ) Recent Results (from the past 240 hour(s))  Culture, blood (routine x 2)     Status: None   Collection Time: 05/06/17  2:33 PM  Result Value Ref Range Status   Specimen Description BLOOD LEFT HAND  Final   Special Requests   Final    BOTTLES DRAWN AEROBIC ONLY Blood Culture adequate volume   Culture   Final    NO GROWTH 5 DAYS Performed at [facility], [address] [place], [place] [address]    Report Status 05/11/2017 FINAL  Final  Culture, blood (routine x 2)     Status: None   Collection Time: 05/06/17  2:40 PM  Result Value Ref Range Status   Specimen Description BLOOD LEFT WRIST  Final   Special Requests   Final    BOTTLES DRAWN AEROBIC ONLY Blood Culture adequate volume   Culture   Final    NO GROWTH 5 DAYS Performed at [facility], [address], [place], [place] [address]    Report Status 05/11/2017 FINAL  Final         Radiology Studies: Dg Chest Port 1 View  Result Date: 05/15/2017 CLINICAL DATA:  Shortness of breath. EXAM: PORTABLE CHEST 1 VIEW COMPARISON:  Chest radiograph May 01, 2017 FINDINGS: 14 cardiac silhouette is mildly enlarged. New pulmonary vascular congestion, mild interstitial prominence with multifocal alveolar  airspace opacities. Trace pleural effusions. No pneumothorax. Tunneled dialysis catheter via RIGHT internal jugular venous approach distal tip projects at RIGHT atrium. No pneumothorax. ACDF. Interval removal of RIGHT PICC line. IMPRESSION: Mild cardiomegaly. New interstitial and alveolar airspace opacities seen with pulmonary edema or pneumonia. Electronically Signed   By: Awilda Metro M.D.   On: 05/15/2017 02:10        Scheduled Meds: . amiodarone  200 mg Oral Daily  . collagenase   Topical Daily  . dexamethasone  3 mg Oral Q8H  . diltiazem  60 mg Oral Q8H  . feeding supplement (GLUCERNA SHAKE)  237 mL Oral BID BM  . guaiFENesin  600 mg Oral BID  . insulin aspart  0-5 Units Subcutaneous QHS  . insulin aspart  0-9 Units  Subcutaneous TID WC  . insulin aspart  2 Units Subcutaneous TID WC  . insulin glargine  12 Units Subcutaneous Daily  . metoprolol tartrate  12.5 mg Oral BID  . mirtazapine  30 mg Oral QHS  . pantoprazole  40 mg Oral Daily  . sodium chloride flush  3 mL Intravenous Q12H   Continuous Infusions: . sodium chloride    . ceFEPime (MAXIPIME) IV Stopped (05/14/17 2117)  . DAPTOmycin (CUBICIN)  IV Stopped (05/14/17 2117)     LOS: 14 days    Time spent: 25 minutes    Alberteen Sam, MD [facility] 05/15/2017, 10:00 AM    Pager [phone] --- please page though AMION:  [email or web address] If 7PM-7AM, please contact night-coverage

## 2017-05-15 NOTE — Progress Notes (Signed)
Patient O2 sat have been between 90 and 94 during Day shift. MD Danford is aware. Nurse will continue to monitor.

## 2017-05-15 NOTE — Progress Notes (Signed)
Patient c/o of SOB with O2 sat between 85-88. Put on 5l of O2. Lungs clear and diminished. Resp. 21 and HR 103-117. MD notified and new orders given. See Orders.

## 2017-05-15 NOTE — Progress Notes (Signed)
Notified by Marchelle Folks RN of pt with SOB.  Upon arrival, pt lying in bed, asleep with mild abdominal accessory muscle use.  Pt is on 5L [place] with sats 92% and RR 20-25.  When I woke Mr. Marzec, he stated he was having a lot of trouble getting his breath.  HR 108-112 afib, BP 110/67 and temp 97.5 F orally.  BBS diminished with fine crackles in RLL.  No wheezing.  Pt did receive HD on [place] RN has contacted primary svc for orders.  Orders received for nebulizer prn and PCXR.  Contracted with Riley Lam RN to titrate oxygen for sats >92% and pulmonary hygiene with IS when awake. Call RR for any further clinical decompensations.

## 2017-05-15 NOTE — Progress Notes (Signed)
Patient ID: Dylan [name], male   DOB: May 15, 1965, 52 y.o.   MRN: [id number] [facility] Progress Note   Assessment/ Plan:   1. Acute kidney injury: Suspected secondary to nephrotoxic ATN from supratherapeutic vancomycin levels.  Anuric and without any renal recovery-underwent hemodialysis today with cautious ultrafiltration so as not to interfere with renal recovery.  With his quadriplegia, poor candidate for chronic outpatient hemodialysis with limitations of transfers and ability to sit in recliner-may need to consider discharge to [facility]. 2. Atrial fibrillation: Overnight with rapid ventricular response that is now rate controlled on diltiazem/metoprolol and amiodarone. 3. Candidemia: On Anidulafungin as recommended by infectious disease-stop date of 4/16 based on repeat cultures. 4.  Cervical abscess versus hematoma: On empiric intravenous daptomycin and cefepime 5.  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  Urinary retention: Secondary to blockage of Foley catheter-underwent exchange by urology.  Subjective:   Rapid response called last night for increased work of breathing/atrial fibrillation with RVR-improved with noninvasive positive pressure ventilation.  Currently reports to be feeling well-on oxygen via nasal cannula.  Objective:   BP 120/80 (BP Location: Left Arm)   Pulse 73   Temp 98.3 F (36.8 C) (Oral)   Resp (!) 22   Ht 6\' 4"  (1.93 m)   Wt (!) 141.5 kg (312 lb)   SpO2 93%   BMI 37.98 kg/m   Intake/Output Summary (Last 24 hours) at 05/15/2017 1049 Last data filed at 05/14/2017 1732 Gross per 24 hour  Intake -  Output 1003 ml  Net -1003 ml   Weight change: 7.711 kg (17 lb)  Physical Exam: Gen: Resting comfortably in bed, oxygen via nasal cannula CVS: Pulse regular rhythm, normal rate, S1 and S2 normal Resp: Anteriorly without any distinct rales/rhonchi Abd: Soft, obese, nontender Ext: Trace-1+  ankle edema  Imaging: Dg Chest Port 1 View  Result Date: 05/15/2017 CLINICAL DATA:  Shortness of breath. EXAM: PORTABLE CHEST 1 VIEW COMPARISON:  Chest radiograph May 01, 2017 FINDINGS: 14 cardiac silhouette is mildly enlarged. New pulmonary vascular congestion, mild interstitial prominence with multifocal alveolar airspace opacities. Trace pleural effusions. No pneumothorax. Tunneled dialysis catheter via RIGHT internal jugular venous approach distal tip projects at RIGHT atrium. No pneumothorax. ACDF. Interval removal of RIGHT PICC line. IMPRESSION: Mild cardiomegaly. New interstitial and alveolar airspace opacities seen with pulmonary edema or pneumonia. Electronically Signed   By: Awilda Metro M.D.   On: 05/15/2017 02:10    Labs: BMET Recent Labs  Lab 05/09/17 0801 05/10/17 0241 05/11/17 0557 05/12/17 0527 05/13/17 0747 05/14/17 0824 05/15/17 0739  NA 133* 132* 133* 134* 134* 133* 133*  K 4.5 5.5* 4.1 3.9 4.2 4.3 3.4*  CL 98* 98* 100* 98* 100* 99* 99*  CO2 19* 20* 21* 23 21* 20* 19*  GLUCOSE 176* 169* 159* 148* 171* 181* 135*  BUN 46* 62* 35* 44* 56* 66* 43*  CREATININE 4.65* 5.32* 3.46* 4.17* 4.77* 5.30* 3.96*  CALCIUM 7.2* 7.4* 7.4* 7.5* 7.3* 7.2* 7.1*  PHOS  --   --   --  4.5 5.1* 5.3* 4.3   CBC Recent Labs  Lab 05/12/17 0527 05/13/17 0748 05/14/17 0824 05/15/17 0739  WBC 13.6* 11.9* 13.4* 11.0*  HGB 10.5* 10.4* 10.2* 9.8*  HCT 29.8* 29.2* 29.2* 28.6*  MCV 84.4 84.4 83.9 83.4  PLT 42* 36* 30* 29*    Medications:    . amiodarone  200 mg Oral Daily  . collagenase   Topical Daily  .  dexamethasone  3 mg Oral Q8H  . diltiazem  60 mg Oral Q8H  . feeding supplement (GLUCERNA SHAKE)  237 mL Oral BID BM  . guaiFENesin  600 mg Oral BID  . insulin aspart  0-5 Units Subcutaneous QHS  . insulin aspart  0-9 Units Subcutaneous TID WC  . insulin aspart  2 Units Subcutaneous TID WC  . insulin glargine  12 Units Subcutaneous Daily  . metoprolol tartrate  12.5 mg  Oral BID  . mirtazapine  30 mg Oral QHS  . pantoprazole  40 mg Oral Daily  . sodium chloride flush  3 mL Intravenous Q12H   Zetta Bills, MD 05/15/2017, 10:49 AM

## 2017-05-15 NOTE — Progress Notes (Signed)
Pt off Bipap and on room air at this time. No WOB at this time. Pt states his breathing feels comfortable at this time. RN notified.

## 2017-05-15 NOTE — Progress Notes (Signed)
Patient placed on Bi-pap by RT.

## 2017-05-16 LAB — CBC
HEMATOCRIT: 25 % — AB (ref 39.0–52.0)
HEMOGLOBIN: 8.9 g/dL — AB (ref 13.0–17.0)
MCH: 29.5 pg (ref 26.0–34.0)
MCHC: 35.6 g/dL (ref 30.0–36.0)
MCV: 82.8 fL (ref 78.0–100.0)
Platelets: 38 10*3/uL — ABNORMAL LOW (ref 150–400)
RBC: 3.02 MIL/uL — ABNORMAL LOW (ref 4.22–5.81)
RDW: 14.4 % (ref 11.5–15.5)
WBC: 6.4 10*3/uL (ref 4.0–10.5)

## 2017-05-16 LAB — RENAL FUNCTION PANEL
ALBUMIN: 1.9 g/dL — AB (ref 3.5–5.0)
Anion gap: 15 (ref 5–15)
BUN: 49 mg/dL — ABNORMAL HIGH (ref 6–20)
CALCIUM: 7 mg/dL — AB (ref 8.9–10.3)
CO2: 20 mmol/L — ABNORMAL LOW (ref 22–32)
Chloride: 98 mmol/L — ABNORMAL LOW (ref 101–111)
Creatinine, Ser: 4.5 mg/dL — ABNORMAL HIGH (ref 0.61–1.24)
GFR, EST AFRICAN AMERICAN: 16 mL/min — AB (ref >60)
GFR, EST NON AFRICAN AMERICAN: 14 mL/min — AB (ref >60)
Glucose, Bld: 110 mg/dL — ABNORMAL HIGH (ref 65–99)
PHOSPHORUS: 5 mg/dL — AB (ref 2.5–4.6)
POTASSIUM: 4.5 mmol/L (ref 3.5–5.1)
SODIUM: 133 mmol/L — AB (ref 135–145)

## 2017-05-16 LAB — GLUCOSE, CAPILLARY
GLUCOSE-CAPILLARY: 141 mg/dL — AB (ref 65–99)
GLUCOSE-CAPILLARY: 166 mg/dL — AB (ref 65–99)
Glucose-Capillary: 131 mg/dL — ABNORMAL HIGH (ref 65–99)
Glucose-Capillary: 70 mg/dL (ref 65–99)
Glucose-Capillary: 92 mg/dL (ref 65–99)

## 2017-05-16 NOTE — Progress Notes (Signed)
Patient ID: Dylan [name], male   DOB: [birth year], 52 y.o.   MRN: [id number] [facility] Progress Note   Assessment/ Plan:   1. Acute kidney injury: Suspected secondary to nephrotoxic ATN from supratherapeutic vancomycin levels.He unfortunately remains anuric and without any renal recovery-we will continue periodic evaluation and assessments for indications for acute hemodialysis.  Getting irrigation of urethral catheter intermittently.  Unfortunately with his quadriplegia and high amount of assistance required, will likely need LTAC if he needs chronic dialysis. 2. Atrial fibrillation: Currently rate controlled on diltiazem/metoprolol and amiodarone.   3. Candidemia: On Anidulafungin as recommended by infectious disease-stop date of 4/16 based on repeat cultures. 4.  Cervical abscess versus hematoma: On empiric intravenous daptomycin and cefepime 5.  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  Urinary retention: Secondary to blockage of Foley catheter-underwent exchange by urology.  Subjective:   Without acute events overnight, reports to be tired this morning-episodes of hypoxia noted on nursing notes overnight.  Objective:   BP 134/81 (BP Location: Right Arm)   Pulse 71   Temp 97.6 F (36.4 C) (Oral)   Resp 20   Ht 6\' 4"  (1.93 m)   Wt 132 kg (291 lb)   SpO2 91%   BMI 35.42 kg/m   Intake/Output Summary (Last 24 hours) at 05/16/2017 0954 Last data filed at 05/15/2017 2300 Gross per 24 hour  Intake 120 ml  Output 0 ml  Net 120 ml   Weight change: -9.526 kg (-21 lb)  Physical Exam: Gen: Resting comfortably in bed, oxygen via nasal cannula CVS: Pulse regular rhythm, normal rate, S1 and S2 normal Resp: Anteriorly clear to auscultation without any distinct rales/rhonchi Abd: Soft, obese, nontender Ext: 1+ ankle edema  Imaging: Dg Chest Port 1 View  Result Date: 05/15/2017 CLINICAL DATA:  Shortness of breath.  EXAM: PORTABLE CHEST 1 VIEW COMPARISON:  Chest radiograph May 01, 2017 FINDINGS: 14 cardiac silhouette is mildly enlarged. New pulmonary vascular congestion, mild interstitial prominence with multifocal alveolar airspace opacities. Trace pleural effusions. No pneumothorax. Tunneled dialysis catheter via RIGHT internal jugular venous approach distal tip projects at RIGHT atrium. No pneumothorax. ACDF. Interval removal of RIGHT PICC line. IMPRESSION: Mild cardiomegaly. New interstitial and alveolar airspace opacities seen with pulmonary edema or pneumonia. Electronically Signed   By: Awilda Metro M.D.   On: 05/15/2017 02:10    Labs: BMET Recent Labs  Lab 05/10/17 0241 05/11/17 0557 05/12/17 0527 05/13/17 0747 05/14/17 0824 05/15/17 0739 05/16/17 0723  NA 132* 133* 134* 134* 133* 133* 133*  K 5.5* 4.1 3.9 4.2 4.3 3.4* 4.5  CL 98* 100* 98* 100* 99* 99* 98*  CO2 20* 21* 23 21* 20* 19* 20*  GLUCOSE 169* 159* 148* 171* 181* 135* 110*  BUN 62* 35* 44* 56* 66* 43* 49*  CREATININE 5.32* 3.46* 4.17* 4.77* 5.30* 3.96* 4.50*  CALCIUM 7.4* 7.4* 7.5* 7.3* 7.2* 7.1* 7.0*  PHOS  --   --  4.5 5.1* 5.3* 4.3 5.0*   CBC Recent Labs  Lab 05/13/17 0748 05/14/17 0824 05/15/17 0739 05/16/17 0723  WBC 11.9* 13.4* 11.0* 6.4  HGB 10.4* 10.2* 9.8* 8.9*  HCT 29.2* 29.2* 28.6* 25.0*  MCV 84.4 83.9 83.4 82.8  PLT 36* 30* 29* 38*    Medications:    . amiodarone  200 mg Oral Daily  . collagenase   Topical Daily  . dexamethasone  3 mg Oral Q8H  . diltiazem  60 mg Oral Q8H  .  feeding supplement (GLUCERNA SHAKE)  237 mL Oral BID BM  . guaiFENesin  600 mg Oral BID  . insulin aspart  0-5 Units Subcutaneous QHS  . insulin aspart  0-9 Units Subcutaneous TID WC  . insulin aspart  2 Units Subcutaneous TID WC  . insulin glargine  12 Units Subcutaneous Daily  . metoprolol tartrate  12.5 mg Oral BID  . mirtazapine  30 mg Oral QHS  . pantoprazole  40 mg Oral Daily  . sodium chloride flush  3 mL  Intravenous Q12H   Zetta Bills, MD 05/16/2017, 9:54 AM

## 2017-05-16 NOTE — Progress Notes (Signed)
Nurse Irrigated patients Foley with 60cc of Normal saline . I got 200cc in return. Nurse than bladder scan patient, which show greater than 200. Will continue to monitor.

## 2017-05-16 NOTE — Plan of Care (Signed)
  Problem: Activity: Goal: Risk for activity intolerance will decrease Outcome: Progressing   

## 2017-05-16 NOTE — Progress Notes (Signed)
PROGRESS NOTE    Dylan [name]  ZOX:[id number] DOB: 1965-02-16 DOA: 04/16/2017 PCP: Patient, No Pcp Per      Brief Narrative:  Dylan [name] is a 52 y.o. 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 abscess seen on MRI. He was also diagnosed with MS at that time. Neurosurgery felt the risks greatly outweighed benefits of repeat exploratory surgery. He was started on IV antibiotics due to complicated UTI, possible cervical abscess and was discharged to [facility] 3/8 with PICC, plan to continue vancomycin and cefepime until 4/4. Vancomycin was stopped earlier due to increasing creatinine.   Patient presented to ED with abd pain, found to have Cr of 5.68 and concerns for non-draining foley cath. After removing foreign body, cath yielded 2.5L urine. Patient also noted to have K in excess of 6.4, given kayexelate. ID was consulted and recommended change to daptomycin and cefepime. Blood cultures grew C. glabrata, so PICC was removed for line holiday and anidulafungin was started. Neurology was consulted and recommended deferral of MS treatment to the outpatient setting as there is no evidence of an acute flare. Nephrology was consulted with progressive oliguric renal failure. HD cath placed 4/5 and HD started.     Assessment & Plan:  Quadriplegia Cervical abscess versus cervical hematoma versus infected cervical hematoma -Finished daptomycin, cefepime today   Acute renal failure -Consult nephrology, appreciate cares   Candidemia Completed ophthalmology eval.  Had 10 days anidulafungin, now stopped due to thrombocytopenia.   Thrombus cytopenia Improved today from 20 9K 30 8K.  Petechiae noted yesterday, but no gum bleeding, epistaxis, other clinical signs of bleeding.   HI T panel negative.  Stopped anidulafungin 3 days early after discussion with infectious disease.  -Trend CBC -Hematology consulted, appreciate recommendations  Type 2 diabetes with nephropathy, on long-term insulin Glucose is well controlled -Continue Lantus, SSI  Paroxysmal atrial fibrillation CHADS2Vasc 1.  Anticoagulation contraindicated.  Intermittent periods of RVR.  Now resolved on its own -Continue amiodarone, diltiazem, metoprolol  Adjustment disorder versus depression -Continue increased dose of mirtazapine  Multiple sclerosis -Follow up with Neurology as outpatient.  Other medications -Continue Glucerna  Hyponatremia Mild  Anemia In context of severe illness. Stable.  Unstageable presure injury to bilateral buttocks, POA        DVT prophylaxis: SCDs Code Status: FULL Family Communication: Attempted call to cousin twice yesterday, no answer, will attempt again today. MDM and disposition Plan: The below labs and imaging reports were reviewed and summarized above.  Medications were adjusted as above.  The patient was admitted with quadriplegia from fluid correction around the spine as well as cervical myelopathy from degenerative disc disease lead to quadriplegia.  He has now developed renal failure in the setting of vancomycin use for an infected hematoma.  He is persistently oliguric, with concern for ESRD.  His thrombocytopenia is persistent, oncology has been consulted.  Anidulfunging stopped, platelets better.    Placement pending, barriers include quadruplegia, elevated BMI requiring hoyer, pressure buttock wounds, and indwelling foley.       Consultants:   Ophtho  Neurology  Psychiatry  Nephrology  Infectious disease  Procedures:   Echocardiogram 4/4 Study Conclusions  - Left ventricle: The cavity size was normal. Wall thickness was   increased in a pattern of moderate LVH. Indeterminant diastolic  function (atrial fibrillation).  The estimated ejection fraction   was 55%. Although no diagnostic regional wall motion abnormality   was identified, this possibility cannot be completely excluded on   the basis of this study. - Aortic valve: There was no stenosis. - Mitral valve: Mildly calcified annulus. There was no significant   regurgitation. - Right ventricle: Poorly visualized. Probably normal size and   systolic function. - Pulmonary arteries: No complete TR doppler jet so unable to   estimate PA systolic pressure. - Inferior vena cava: The vessel was normal in size. The   respirophasic diameter changes were in the normal range (>= 50%),   consistent with normal central venous pressure.  Impressions:  - Technically difficult study with poor acoustic windows. Normal LV   size with moderate LV hypertrophy. EF 55%. Poorly visualized RV   but probably normal. No significant valvular abnormalities.  Antimicrobials:   Cefepime 3/30 >>  Anidulafungin 4/2 >>  Daptomycin 3/31 >>    Subjective: No more tachypnea or respiratory distress overnight.  No chest discomfort, no trouble breathing at this moment.  No gum bleeding, nosebleeds, fever.  No cough, sputum production.  Objective: Vitals:   05/15/17 2306 05/15/17 2357 05/16/17 0416 05/16/17 0756  BP:  130/87 134/81   Pulse: 63 60 64 71  Resp: 20  (!) 21 20  Temp:  97.8 F (36.6 C) 97.6 F (36.4 C)   TempSrc:  Oral Oral   SpO2: 94%  90% 91%  Weight:   132 kg (291 lb)   Height:        Intake/Output Summary (Last 24 hours) at 05/16/2017 1351 Last data filed at 05/15/2017 2300 Gross per 24 hour  Intake 120 ml  Output 0 ml  Net 120 ml   Filed Weights   05/14/17 0429 05/15/17 0500 05/16/17 0416  Weight: 133.8 kg (295 lb) (!) 141.5 kg (312 lb) 132 kg (291 lb)    Examination: General appearance: Quadriplegic adult male, lying in bed, interactive, appears depressed. HEENT: Anicteric, conjunctival pink, lids and lashes normal, no nasal deformity,  discharge, or epistaxis.  Oropharynx is good, without oral lesions, without oral dryness, and no gum bleeding.   Skin: Skin is warm and dry, petechiae in the wrists. cardiac: Regular rate and rhythm, no murmurs, no lower extremity edema.   Respiratory: Respiratory effort normal, lungs clear without wheezes Abdomen: Soft without tenderness to palpation, no HSM MSK: Flaccid lower extremities, weak upper extremities.  No deformities or effusions in the large joints of the upper or lower extremities bilaterally. Neuro: Lying in bed, legs flaccid, arms weak.  Sensation diminished on legs, abdomen, arms.    Psych: Sensorium intact, normal response to questions, oriented to person, place, and time, attention normal, judgment and insight normal, affect sad.    Data Reviewed: I have personally reviewed following labs and imaging studies:  CBC: Recent Labs  Lab 05/12/17 0527 05/13/17 0748 05/14/17 0824 05/15/17 0739 05/16/17 0723  WBC 13.6* 11.9* 13.4* 11.0* 6.4  HGB 10.5* 10.4* 10.2* 9.8* 8.9*  HCT 29.8* 29.2* 29.2* 28.6* 25.0*  MCV 84.4 84.4 83.9 83.4 82.8  PLT 42* 36* 30* 29* 38*   Basic Metabolic Panel: Recent Labs  Lab 05/12/17 0527 05/13/17 0747 05/14/17 0824 05/15/17 0739 05/16/17 0723  NA 134* 134* 133* 133* 133*  K 3.9 4.2 4.3 3.4* 4.5  CL 98* 100* 99* 99* 98*  CO2 23 21* 20* 19* 20*  GLUCOSE 148* 171* 181* 135* 110*  BUN 44* 56*  66* 43* 49*  CREATININE 4.17* 4.77* 5.30* 3.96* 4.50*  CALCIUM 7.5* 7.3* 7.2* 7.1* 7.0*  PHOS 4.5 5.1* 5.3* 4.3 5.0*   GFR: Estimated Creatinine Clearance: 29.5 mL/min (A) (by C-G formula based on SCr of 4.5 mg/dL (H)). Liver Function Tests: Recent Labs  Lab 05/12/17 0527 05/13/17 0747 05/14/17 0824 05/15/17 0739 05/16/17 0723  ALBUMIN 1.8* 1.9* 2.0* 2.0* 1.9*   No results for input(s): LIPASE, AMYLASE in the last 168 hours. No results for input(s): AMMONIA in the last 168 hours. Coagulation Profile: Recent Labs  Lab 05/12/17 0957   INR 1.27   Cardiac Enzymes: Recent Labs  Lab 05/11/17 0557 05/14/17 0824  CKTOTAL 97 92   BNP (last 3 results) No results for input(s): PROBNP in the last 8760 hours. HbA1C: No results for input(s): HGBA1C in the last 72 hours. CBG: Recent Labs  Lab 05/15/17 1132 05/15/17 1716 05/15/17 2138 05/16/17 0901 05/16/17 1231  GLUCAP 115* 98 84 141* 166*   Lipid Profile: No results for input(s): CHOL, HDL, LDLCALC, TRIG, CHOLHDL, LDLDIRECT in the last 72 hours. Thyroid Function Tests: No results for input(s): TSH, T4TOTAL, FREET4, T3FREE, THYROIDAB in the last 72 hours. Anemia Panel: No results for input(s): VITAMINB12, FOLATE, FERRITIN, TIBC, IRON, RETICCTPCT in the last 72 hours. Urine analysis:    Component Value Date/Time   COLORURINE YELLOW May 08, 2017 0920   APPEARANCEUR TURBID (A) May 08, 2017 0920   LABSPEC 1.015 05-08-17 0920   PHURINE 6.0 05/08/2017 0920   GLUCOSEU NEGATIVE 05-08-17 0920   HGBUR LARGE (A) 2017-05-08 0920   BILIRUBINUR NEGATIVE 08-May-2017 0920   KETONESUR NEGATIVE 05/08/17 0920   PROTEINUR 30 (A) 05-08-2017 0920   NITRITE NEGATIVE 05/08/2017 0920   LEUKOCYTESUR MODERATE (A) 05-08-2017 0920   Sepsis Labs: @LABRCNTIP (procalcitonin:4,lacticacidven:4)  ) Recent Results (from the past 240 hour(s))  Culture, blood (routine x 2)     Status: None   Collection Time: 05/06/17  2:33 PM  Result Value Ref Range Status   Specimen Description BLOOD LEFT HAND  Final   Special Requests   Final    BOTTLES DRAWN AEROBIC ONLY Blood Culture adequate volume   Culture   Final    NO GROWTH 5 DAYS Performed at [facility], [address] [place], [place] [address]    Report Status 05/11/2017 FINAL  Final  Culture, blood (routine x 2)     Status: None   Collection Time: 05/06/17  2:40 PM  Result Value Ref Range Status   Specimen Description BLOOD LEFT WRIST  Final   Special Requests   Final    BOTTLES DRAWN AEROBIC ONLY Blood Culture adequate  volume   Culture   Final    NO GROWTH 5 DAYS Performed at [facility], [address] [place], [place] [address]    Report Status 05/11/2017 FINAL  Final         Radiology Studies: Dg Chest Port 1 View  Result Date: 05/15/2017 CLINICAL DATA:  Shortness of breath. EXAM: PORTABLE CHEST 1 VIEW COMPARISON:  Chest radiograph May 08, 2017 FINDINGS: 14 cardiac silhouette is mildly enlarged. New pulmonary vascular congestion, mild interstitial prominence with multifocal alveolar airspace opacities. Trace pleural effusions. No pneumothorax. Tunneled dialysis catheter via RIGHT internal jugular venous approach distal tip projects at RIGHT atrium. No pneumothorax. ACDF. Interval removal of RIGHT PICC line. IMPRESSION: Mild cardiomegaly. New interstitial and alveolar airspace opacities seen with pulmonary edema or pneumonia. Electronically Signed   By: Michel Santee.D.  On: 05/15/2017 02:10        Scheduled Meds: . amiodarone  200 mg Oral Daily  . collagenase   Topical Daily  . dexamethasone  3 mg Oral Q8H  . diltiazem  60 mg Oral Q8H  . feeding supplement (GLUCERNA SHAKE)  237 mL Oral BID BM  . guaiFENesin  600 mg Oral BID  . insulin aspart  0-5 Units Subcutaneous QHS  . insulin aspart  0-9 Units Subcutaneous TID WC  . insulin aspart  2 Units Subcutaneous TID WC  . insulin glargine  12 Units Subcutaneous Daily  . metoprolol tartrate  12.5 mg Oral BID  . mirtazapine  30 mg Oral QHS  . pantoprazole  40 mg Oral Daily  . sodium chloride flush  3 mL Intravenous Q12H   Continuous Infusions: . sodium chloride    . ceFEPime (MAXIPIME) IV Stopped (05/15/17 1950)  . DAPTOmycin (CUBICIN)  IV Stopped (05/14/17 2117)     LOS: 15 days    Time spent: 25 minutes    Alberteen Sam, MD [facility] 05/16/2017, 11:45 AM    Pager [phone] --- please page though AMION:  [email or web address] Password [email or web address] If 7PM-7AM, please contact night-coverage

## 2017-05-17 LAB — CBC
HEMATOCRIT: 25.9 % — AB (ref 39.0–52.0)
Hemoglobin: 9.1 g/dL — ABNORMAL LOW (ref 13.0–17.0)
MCH: 29.6 pg (ref 26.0–34.0)
MCHC: 35.1 g/dL (ref 30.0–36.0)
MCV: 84.4 fL (ref 78.0–100.0)
PLATELETS: 25 10*3/uL — AB (ref 150–400)
RBC: 3.07 MIL/uL — ABNORMAL LOW (ref 4.22–5.81)
RDW: 14.7 % (ref 11.5–15.5)
WBC: 8 10*3/uL (ref 4.0–10.5)

## 2017-05-17 LAB — GLUCOSE, CAPILLARY
GLUCOSE-CAPILLARY: 97 mg/dL (ref 65–99)
Glucose-Capillary: 118 mg/dL — ABNORMAL HIGH (ref 65–99)
Glucose-Capillary: 132 mg/dL — ABNORMAL HIGH (ref 65–99)
Glucose-Capillary: 124 mg/dL — ABNORMAL HIGH (ref 65–99)
Glucose-Capillary: 115 mg/dL — ABNORMAL HIGH (ref 65–99)
Glucose-Capillary: 177 mg/dL — ABNORMAL HIGH (ref 65–99)

## 2017-05-17 LAB — RENAL FUNCTION PANEL
Albumin: 1.9 g/dL — ABNORMAL LOW (ref 3.5–5.0)
Anion gap: 13 (ref 5–15)
BUN: 58 mg/dL — ABNORMAL HIGH (ref 6–20)
CO2: 21 mmol/L — ABNORMAL LOW (ref 22–32)
Calcium: 7 mg/dL — ABNORMAL LOW (ref 8.9–10.3)
Chloride: 101 mmol/L (ref 101–111)
Creatinine, Ser: 5.04 mg/dL — ABNORMAL HIGH (ref 0.61–1.24)
GFR calc Af Amer: 14 mL/min — ABNORMAL LOW
GFR calc non Af Amer: 12 mL/min — ABNORMAL LOW
Glucose, Bld: 134 mg/dL — ABNORMAL HIGH (ref 65–99)
Phosphorus: 5.3 mg/dL — ABNORMAL HIGH (ref 2.5–4.6)
Potassium: 4 mmol/L (ref 3.5–5.1)
Sodium: 135 mmol/L (ref 135–145)

## 2017-05-17 MED ORDER — GLUCERNA SHAKE PO LIQD
237.0000 mL | Freq: Three times a day (TID) | ORAL | Status: DC
Start: 2017-05-17 — End: 2017-05-18
  Administered 2017-05-17: 237 mL via ORAL

## 2017-05-17 NOTE — Progress Notes (Signed)
Events noted in the last few days.  The platelet count continues to be overall stable but has not recovered currently at 25,000. Anidulafungin was discontinued close to 4 days ago at this time.  His HIT panel is negative.  If this is a drug related thrombocytopenia, usually takes up to 7-10 days to see recovery.  I recommend continue monitoring his labs as you are doing.  No further intervention is needed.  We will continue to monitor his labs periodically.

## 2017-05-17 NOTE — Progress Notes (Signed)
Pt had only urinated 50ml all shift so this nurse bladder scanned the pt and it read >481. This nurse irrigated the foley catheter and a small amount of white slime came out of the catheter, and once that was removed the irrigating was much easier. A total of of yellow/cloudy urine was removed.

## 2017-05-17 NOTE — Progress Notes (Signed)
Pt. put on BIPAP by RT, O2 SAT became stable in  95-96 %  Pt, verbalized breathes better and resting  Well with   no  respiratory distress. RN will continue to monitor pt.distress

## 2017-05-17 NOTE — Progress Notes (Signed)
PROGRESS NOTE    Dylan [name]  ZOX:[id number] DOB: 1966-01-26 DOA: 04/26/2017 PCP: Patient, No Pcp Per      Brief Narrative:  Dylan [name] is a 52 y.o. 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 abscess seen on MRI. He was also diagnosed with MS at that time. Neurosurgery felt the risks greatly outweighed benefits of repeat exploratory surgery. He was started on IV antibiotics due to complicated UTI, possible cervical abscess and was discharged to [facility] 3/8 with PICC, plan to continue vancomycin and cefepime until 4/4. Vancomycin was stopped earlier due to increasing creatinine.   Patient presented to ED with abd pain, found to have Cr of 5.68 and concerns for non-draining foley cath. After removing foreign body, cath yielded 2.5L urine. Patient also noted to have K in excess of 6.4, given kayexelate. ID was consulted and recommended change to daptomycin and cefepime. Blood cultures grew C. glabrata, so PICC was removed for line holiday and anidulafungin was started. Neurology was consulted and recommended deferral of MS treatment to the outpatient setting as there is no evidence of an acute flare. Nephrology was consulted with progressive oliguric renal failure. HD cath placed 4/5 and HD started.     Assessment & Plan:  Quadriplegia Cervical abscess versus cervical hematoma versus infected cervical hematoma Completed 16 days daptomycin, cefepime for infected cervical hematoma.   Acute renal failure -Consult Nephrology  Neurogenic bladder Patient has had a foley catheter for some time.  Previous notes suggested this was "placed by Urology", but on my review, Urology saw the patient only once, at night, for concerns of a non-flushing foley, they flushed it, and  have had no further problems.  Discussed with nursing today who felt PRN in and out cath was more appropriate long-term strategy for neurogenic bladder. -DC foley -In and out cath as needed  Candidemia Completed ophthalmology eval.  Had 10 days anidulafungin, now stopped due to thrombocytopenia.   Thrombocytopenia Stable.  No new bleeding.  HIT negative.   Stopped anidulafungin 3 days early after discussion with infectious disease.  -Monitor CBC daily -Hematology consulted, appreciate recommendations  Type 2 diabetes with nephropathy, on long-term insulin Glucose is well controlled -Continue Lantus, sliding scale corrections  Accessible atrial fibrillation CHADS2Vasc 1.  Anticoagulation contraindicated.  Intermittent periods of RVR.  Now resolved on its own -Continue amiodarone, diltiazem, metoprolol  Adjustment disorder versus depression -Continue increased dose of mirtazapine -Consult psychiatry  Multiple sclerosis -Follow up with Neurology as outpatient.  Other medications -Continue Glucerna  Hyponatremia Mild  Anemia In context of severe illness. Stable.  Unstageable presure injury to bilateral buttocks, POA        DVT prophylaxis: SCDs Code Status: FULL Family Communication: Attempted call to cousin twice yesterday, no answer, will attempt again today. MDM and disposition Plan: The below labs and imaging reports were reviewed and summarized above.  Medications were adjusted as above.  The patient was admitted with quadriplegia from fluid correction around the spine as well as cervical myelopathy from degenerative disc disease lead to quadriplegia.  He has now developed renal failure in the setting of vancomycin use for an infected hematoma.  He is persistently oliguric, with concern for ESRD.  His thrombocytopenia is persistent and severe.  Oncology following.  Eraxis stopped, monitoring.  Placement pending,  barriers include quadruplegia, elevated BMI  requiring hoyer, pressure buttock wounds, and indwelling foley.  Case management involved.       Consultants:   Ophtho  Neurology  Psychiatry  Nephrology  Infectious disease  Procedures:   Echocardiogram 4/4 Study Conclusions  - Left ventricle: The cavity size was normal. Wall thickness was   increased in a pattern of moderate LVH. Indeterminant diastolic   function (atrial fibrillation). The estimated ejection fraction   was 55%. Although no diagnostic regional wall motion abnormality   was identified, this possibility cannot be completely excluded on   the basis of this study. - Aortic valve: There was no stenosis. - Mitral valve: Mildly calcified annulus. There was no significant   regurgitation. - Right ventricle: Poorly visualized. Probably normal size and   systolic function. - Pulmonary arteries: No complete TR doppler jet so unable to   estimate PA systolic pressure. - Inferior vena cava: The vessel was normal in size. The   respirophasic diameter changes were in the normal range (>= 50%),   consistent with normal central venous pressure.  Impressions:  - Technically difficult study with poor acoustic windows. Normal LV   size with moderate LV hypertrophy. EF 55%. Poorly visualized RV   but probably normal. No significant valvular abnormalities.  Antimicrobials:   Cefepime 3/30 >>  Anidulafungin 4/2 >>  Daptomycin 3/31 >>    Subjective: Depressed.  No concerns overnight per nursing.  Was on BiPAP briefly, for which there is no indication nor orders to authorize.  No gum bleeding, nosebleeds.  No fever, confusion, cough, vomiting.     Objective: Vitals:   05/17/17 0316 05/17/17 0318 05/17/17 0322 05/17/17 0730  BP:   133/83 (!) 150/84  Pulse:   66 60  Resp:  (!) 24 20 15   Temp:   (!) 97.3 F (36.3 C)   TempSrc:   Axillary   SpO2: (!) 80% 92% 96% 100%  Weight:      Height:        Intake/Output Summary (Last 24 hours) at 05/17/2017  0737 Last data filed at 05/17/2017 0600 Gross per 24 hour  Intake -  Output 1350 ml  Net -1350 ml   Filed Weights   05/15/17 0500 05/16/17 0416 05/16/17 2344  Weight: (!) 141.5 kg (312 lb) 132 kg (291 lb) (!) 138.3 kg (305 lb)    Examination: General appearance: Quadruplegic adult male, no acute distress HEENT: Anicteric, conjunctival pink, lids and lashes normal, no nasal deformity, discharge, or epistaxis.  Oropharynx is good, without oral lesions, without oral dryness, and no gum bleeding.   Skin: Skin warm and dry, petechiae resolved on left wrist, still present on right cardiac: RRR, no LE edema Respiratory: Respiratory effort normal, lungs clear Abdomen: Soft without tenderness to palpation, no HSM MSK: Flaccid lower extremities, weak upper extremities.  No deformities or effusions in the large joints of the upper or lower extremities bilaterally. Neuro: Lying in bed, legs flaccid, arms weak, hands atrophied.  Sensation diminished on legs, arms.    Psych: Depressed.  Attention normal.    Data Reviewed: I have personally reviewed following labs and imaging studies:  CBC: Recent Labs  Lab 05/13/17 0748 05/14/17 0824 05/15/17 0739 05/16/17 0723 05/17/17 0414  WBC 11.9* 13.4* 11.0* 6.4 8.0  HGB 10.4* 10.2* 9.8* 8.9* 9.1*  HCT 29.2* 29.2* 28.6* 25.0* 25.9*  MCV 84.4 83.9 83.4 82.8 84.4  PLT 36* 30* 29* 38* 25*   Basic Metabolic Panel: Recent Labs  Lab 05/13/17 0747 05/14/17 0824 05/15/17 0739 05/16/17 0723 05/17/17 0414  NA 134* 133* 133* 133* 135  K 4.2 4.3 3.4* 4.5 4.0  CL 100* 99* 99* 98* 101  CO2 21* 20* 19* 20* 21*  GLUCOSE 171* 181* 135* 110* 134*  BUN 56* 66* 43* 49* 58*  CREATININE 4.77* 5.30* 3.96* 4.50* 5.04*  CALCIUM 7.3* 7.2* 7.1* 7.0* 7.0*  PHOS 5.1* 5.3* 4.3 5.0* 5.3*   GFR: Estimated Creatinine Clearance: 26.9 mL/min (A) (by C-G formula based on SCr of 5.04 mg/dL (H)). Liver Function Tests: Recent Labs  Lab 05/13/17 0747 05/14/17 0824  05/15/17 0739 05/16/17 0723 05/17/17 0414  ALBUMIN 1.9* 2.0* 2.0* 1.9* 1.9*   No results for input(s): LIPASE, AMYLASE in the last 168 hours. No results for input(s): AMMONIA in the last 168 hours. Coagulation Profile: Recent Labs  Lab 05/12/17 0957  INR 1.27   Cardiac Enzymes: Recent Labs  Lab 05/11/17 0557 05/14/17 0824  CKTOTAL 97 92   BNP (last 3 results) No results for input(s): PROBNP in the last 8760 hours. HbA1C: No results for input(s): HGBA1C in the last 72 hours. CBG: Recent Labs  Lab 05/16/17 1629 05/16/17 2153 05/16/17 2342 05/17/17 0319 05/17/17 0556  GLUCAP 131* 70 92 132* 124*   Lipid Profile: No results for input(s): CHOL, HDL, LDLCALC, TRIG, CHOLHDL, LDLDIRECT in the last 72 hours. Thyroid Function Tests: No results for input(s): TSH, T4TOTAL, FREET4, T3FREE, THYROIDAB in the last 72 hours. Anemia Panel: No results for input(s): VITAMINB12, FOLATE, FERRITIN, TIBC, IRON, RETICCTPCT in the last 72 hours. Urine analysis:    Component Value Date/Time   COLORURINE YELLOW 05/24/2017 0920   APPEARANCEUR TURBID (A) May 24, 2017 0920   LABSPEC 1.015 05/24/17 0920   PHURINE 6.0 2017-05-24 0920   GLUCOSEU NEGATIVE 24-May-2017 0920   HGBUR LARGE (A) 2017-05-24 0920   BILIRUBINUR NEGATIVE 2017/05/24 0920   KETONESUR NEGATIVE 2017/05/24 0920   PROTEINUR 30 (A) 05-24-17 0920   NITRITE NEGATIVE 05-24-17 0920   LEUKOCYTESUR MODERATE (A) 05/24/2017 0920   Sepsis Labs: @LABRCNTIP (procalcitonin:4,lacticacidven:4)  ) No results found for this or any previous visit (from the past 240 hour(s)).       Radiology Studies: No results found.      Scheduled Meds: . amiodarone  200 mg Oral Daily  . collagenase   Topical Daily  . dexamethasone  3 mg Oral Q8H  . diltiazem  60 mg Oral Q8H  . feeding supplement (GLUCERNA SHAKE)  237 mL Oral BID BM  . guaiFENesin  600 mg Oral BID  . insulin aspart  0-5 Units Subcutaneous QHS  . insulin aspart  0-9  Units Subcutaneous TID WC  . insulin aspart  2 Units Subcutaneous TID WC  . insulin glargine  12 Units Subcutaneous Daily  . metoprolol tartrate  12.5 mg Oral BID  . mirtazapine  30 mg Oral QHS  . pantoprazole  40 mg Oral Daily   Continuous Infusions:    LOS: 16 days    Time spent: 25 minutes   Alberteen Sam, MD [facility] 05/17/2017, 10:00 AM    Pager [phone] --- please page though AMION:  [email or web address] Password [email or web address] If 7PM-7AM, please contact night-coverage

## 2017-05-17 NOTE — Care Management Note (Signed)
Case Management Note  Patient Details  Name: MARKEIL ROMBERGER MRN: [id number] Date of Birth: 08/07/1965  Subjective/Objective:                    Action/Plan: Per my medical director, pt is not a LTACH candidate. If patient will require long term HD, we will need to find a facility that can provide a lift for him to transfer and he will need to be able to tolerate wheelchair transportation to HD. There are some stretcher dialysis centers but they [place] of state. CM following for d/c disposition.  Expected Discharge Date:                  Expected Discharge Plan:  Skilled Nursing Facility  In-House Referral:  Clinical Social Work  Discharge planning Services     Post Acute Care Choice:    Choice offered to:     DME Arranged:    DME Agency:     HH Arranged:    HH Agency:     Status of Service:  In process, will continue to follow  If discussed at Long Length of Stay Meetings, dates discussed:    Additional Comments:  Kermit Balo, RN 05/17/2017, 9:17 AM

## 2017-05-17 NOTE — Progress Notes (Signed)
Patient ID: Dylan [name], male   DOB: [birth year], 52 y.o.   MRN: [id number] [facility] Progress Note   Assessment/ Plan:   1. Acute kidney injury: Suspected secondary to nephrotoxic ATN from supratherapeutic vancomycin levels.He unfortunately remains anuric and without any renal recovery-we will continue periodic evaluation and assessments for indications for acute hemodialysis-- will plan for tomorrow 4/16.  Getting irrigation of urethral catheter intermittently.  Unfortunately with his quadriplegia and high amount of assistance required, will need placement out of state if continues to need HD 2. Atrial fibrillation: Currently rate controlled on diltiazem/metoprolol and amiodarone.   3. Candidemia: On Anidulafungin as recommended by infectious disease-stop date of 4/16 based on repeat cultures. 4.  Cervical abscess versus hematoma: On empiric intravenous daptomycin and cefepime 5.  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  Urinary retention: Secondary to blockage of Foley catheter-underwent exchange by urology.  Subjective:    Reports that he wants to continue dialysis because he "wants to live".    Objective:   BP (!) 150/84   Pulse 60   Temp (!) 97.3 F (36.3 C) (Axillary)   Resp 15   Ht 6\' 4"  (1.93 m)   Wt (!) 138.3 kg (305 lb)   SpO2 100%   BMI 37.13 kg/m   Intake/Output Summary (Last 24 hours) at 05/17/2017 1142 Last data filed at 05/17/2017 0600 Gross per 24 hour  Intake 180 ml  Output 1350 ml  Net -1170 ml   Weight change: 6.35 kg (14 lb)  Physical Exam: Gen: Resting comfortably in bed, oxygen via nasal cannula CVS: Pulse regular rhythm, normal rate, S1 and S2 normal Resp: Anteriorly clear to auscultation without any distinct rales/rhonchi Abd: Soft, obese, nontender Ext: 1+ ankle edema MSK: +sarcopenia  Imaging: No results found.  Labs: BMET Recent Labs  Lab 05/11/17 0557 05/12/17 0527  05/13/17 0747 05/14/17 0824 05/15/17 0739 05/16/17 0723 05/17/17 0414  NA 133* 134* 134* 133* 133* 133* 135  K 4.1 3.9 4.2 4.3 3.4* 4.5 4.0  CL 100* 98* 100* 99* 99* 98* 101  CO2 21* 23 21* 20* 19* 20* 21*  GLUCOSE 159* 148* 171* 181* 135* 110* 134*  BUN 35* 44* 56* 66* 43* 49* 58*  CREATININE 3.46* 4.17* 4.77* 5.30* 3.96* 4.50* 5.04*  CALCIUM 7.4* 7.5* 7.3* 7.2* 7.1* 7.0* 7.0*  PHOS  --  4.5 5.1* 5.3* 4.3 5.0* 5.3*   CBC Recent Labs  Lab 05/14/17 0824 05/15/17 0739 05/16/17 0723 05/17/17 0414  WBC 13.4* 11.0* 6.4 8.0  HGB 10.2* 9.8* 8.9* 9.1*  HCT 29.2* 28.6* 25.0* 25.9*  MCV 83.9 83.4 82.8 84.4  PLT 30* 29* 38* 25*    Medications:    . amiodarone  200 mg Oral Daily  . collagenase   Topical Daily  . dexamethasone  3 mg Oral Q8H  . diltiazem  60 mg Oral Q8H  . feeding supplement (GLUCERNA SHAKE)  237 mL Oral BID BM  . guaiFENesin  600 mg Oral BID  . insulin aspart  0-5 Units Subcutaneous QHS  . insulin aspart  0-9 Units Subcutaneous TID WC  . insulin aspart  2 Units Subcutaneous TID WC  . insulin glargine  12 Units Subcutaneous Daily  . metoprolol tartrate  12.5 mg Oral BID  . mirtazapine  30 mg Oral QHS  . pantoprazole  40 mg Oral Daily   Bufford Buttner, MD [facility] pgr [phone] 05/17/2017, 11:42 AM

## 2017-05-17 NOTE — Progress Notes (Signed)
Physical Therapy Treatment Patient Details Name: Dylan [name] MRN: [id number] DOB: 10/23/1965 Today's Date: 05/17/2017    History of Present Illness Dylan [name] is a 52yo black male who comes to [facility] on 3/30 with ABD pain and hypoglycemia, admitted with cervical abcess v hematoma s/p C five-seven surgery on . 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 He DC to SNF ~3WA with motor weakness C7 down, full sensation intact. He reports graual worsening of manipulative use with smartphone over those three weaks.      PT Comments    Pt requiring total assist +2-3 for bed mobility and requires max assist to maintain sitting balance at EOB. Noted bil UE subluxations (L worse than R). Recommend prevalon boots to prevent skin breakdown. Positioned pt for comfort with pillows padding body prominences and bil UEs elevated. Pt able to demonstrate use of soft touch call bell when placed under left elbow. Current plan remains appropriate. Will continue to follow acutely and progress as tolerated.    Follow Up Recommendations  SNF;Supervision/Assistance - 24 hour     Equipment Recommendations  None recommended by PT    Recommendations for Other Services OT consult     Precautions / Restrictions Precautions Precautions: Fall;Cervical    Mobility  Bed Mobility Overal bed mobility: Needs Assistance Bed Mobility: Rolling;Sidelying to Sit;Sit to Sidelying Rolling: Total assist;+2 for physical assistance;+2 for safety/equipment Sidelying to sit: Total assist;+2 for physical assistance;+2 for safety/equipment     Sit to sidelying: Total assist;+2 for physical assistance;+2 for safety/equipment General bed mobility comments: total assist +2 for rolling, +3 required side<>sit to manage LEs and +2 to elevate and lower trunk  Transfers                 General transfer comment: unable due to current pt status  Ambulation/Gait                 Stairs             Wheelchair Mobility    Modified Rankin (Stroke Patients Only)       Balance Overall balance assessment: Needs assistance Sitting-balance support: Bilateral upper extremity supported;Feet supported Sitting balance-Leahy Scale: Zero   Postural control: Posterior lean                                  Cognition Arousal/Alertness: Awake/alert Behavior During Therapy: WFL for tasks assessed/performed Overall Cognitive Status: Within Functional Limits for tasks assessed                                 General Comments: potential slow processing-pt slower to respond to verbal questions      Exercises Other Exercises Other Exercises: supine passive heel cord stretch bil 3x10 sec Other Exercises: sitting EOB with feet supported max assist to maintain upright ~10 min Other Exercises: shoulder flexion x5 bil with guiding of scapula and manual approximation of shoulder subluxation Other Exercises: trunk extension stretch in sitting with sheet 3x10 sec Other Exercises: Cervical AROM and AAROM 5x10 sec    General Comments General comments (skin integrity, edema, etc.): VSS throughout      Pertinent Vitals/Pain Pain Assessment: No/denies pain    Home Living  Prior Function            PT Goals (current goals can now be found in the care plan section) Acute Rehab PT Goals Patient Stated Goal: to be able to use his hands  PT Goal Formulation: With patient Time For Goal Achievement: 05/26/17 Potential to Achieve Goals: Fair Progress towards PT goals: Progressing toward goals    Frequency    Min 2X/week      PT Plan Current plan remains appropriate    Co-evaluation              AM-PAC PT "6 Clicks" Daily Activity  Outcome Measure  Difficulty turning over in bed  (including adjusting bedclothes, sheets and blankets)?: Unable Difficulty moving from lying on back to sitting on the side of the bed? : Unable Difficulty sitting down on and standing up from a chair with arms (e.g., wheelchair, bedside commode, etc,.)?: Unable Help needed moving to and from a bed to chair (including a wheelchair)?: Total Help needed walking in hospital room?: Total Help needed climbing 3-5 steps with a railing? : Total 6 Click Score: 6    End of Session   Activity Tolerance: Patient tolerated treatment well;No increased pain Patient left: in bed;with call bell/phone within reach;with bed alarm set;with SCD's reapplied Nurse Communication: Mobility status PT Visit Diagnosis: Other symptoms and signs involving the nervous system (R29.898);Difficulty in walking, not elsewhere classified (R26.2);Muscle weakness (generalized) (M62.81)     Time: 1610-9604 PT Time Calculation (min) (ACUTE ONLY): 29 min  Charges:  $Therapeutic Activity: 8-22 mins                    G Codes:       Barrie Dunker, SPT   Barrie Dunker 05/17/2017, 5:24 PM

## 2017-05-18 LAB — BLOOD GAS, ARTERIAL
ACID-BASE DEFICIT: 1.6 mmol/L (ref 0.0–2.0)
BICARBONATE: 22 mmol/L (ref 20.0–28.0)
DRAWN BY: 518061
FIO2: 40
O2 Content: 5 L/min
O2 SAT: 86.6 %
PATIENT TEMPERATURE: 98.6
pCO2 arterial: 33 mmHg (ref 32.0–48.0)
pH, Arterial: 7.439 (ref 7.350–7.450)
pO2, Arterial: 52 mmHg — ABNORMAL LOW (ref 83.0–108.0)

## 2017-05-18 LAB — CBC
HEMATOCRIT: 25.2 % — AB (ref 39.0–52.0)
HEMOGLOBIN: 8.7 g/dL — AB (ref 13.0–17.0)
MCH: 28.8 pg (ref 26.0–34.0)
MCHC: 34.5 g/dL (ref 30.0–36.0)
MCV: 83.4 fL (ref 78.0–100.0)
Platelets: 15 10*3/uL — CL (ref 150–400)
RBC: 3.02 MIL/uL — AB (ref 4.22–5.81)
RDW: 14.7 % (ref 11.5–15.5)
WBC: 6.8 10*3/uL (ref 4.0–10.5)

## 2017-05-18 LAB — GLUCOSE, CAPILLARY
GLUCOSE-CAPILLARY: 131 mg/dL — AB (ref 65–99)
GLUCOSE-CAPILLARY: 144 mg/dL — AB (ref 65–99)
Glucose-Capillary: 154 mg/dL — ABNORMAL HIGH (ref 65–99)
Glucose-Capillary: 156 mg/dL — ABNORMAL HIGH (ref 65–99)

## 2017-05-18 LAB — RENAL FUNCTION PANEL
ALBUMIN: 1.7 g/dL — AB (ref 3.5–5.0)
ANION GAP: 14 (ref 5–15)
BUN: 66 mg/dL — ABNORMAL HIGH (ref 6–20)
CO2: 21 mmol/L — ABNORMAL LOW (ref 22–32)
Calcium: 6.7 mg/dL — ABNORMAL LOW (ref 8.9–10.3)
Chloride: 103 mmol/L (ref 101–111)
Creatinine, Ser: 5.64 mg/dL — ABNORMAL HIGH (ref 0.61–1.24)
GFR calc non Af Amer: 11 mL/min — ABNORMAL LOW (ref >60)
GFR, EST AFRICAN AMERICAN: 12 mL/min — AB (ref >60)
Glucose, Bld: 149 mg/dL — ABNORMAL HIGH (ref 65–99)
POTASSIUM: 4.3 mmol/L (ref 3.5–5.1)
Phosphorus: 5.9 mg/dL — ABNORMAL HIGH (ref 2.5–4.6)
SODIUM: 138 mmol/L (ref 135–145)

## 2017-05-18 MED ORDER — ENSURE ENLIVE PO LIQD
237.0000 mL | Freq: Two times a day (BID) | ORAL | Status: DC
  Administered 2017-05-18 – 2017-06-07 (×26): 237 mL via ORAL

## 2017-05-18 NOTE — Progress Notes (Signed)
Nutrition Follow-up  DOCUMENTATION CODES:   Obesity unspecified  INTERVENTION:  Ensure Enlive po BID, each supplement provides 350 kcal and 20 grams of protein  NUTRITION DIAGNOSIS:   Inadequate oral intake related to poor appetite as evidenced by per patient/family report. -ongoing  GOAL:   Patient will meet greater than or equal to 90% of their needs -unmet  MONITOR:   PO intake, Supplement acceptance, Weight trends, I & O's, Labs  REASON FOR ASSESSMENT:   Low Braden    ASSESSMENT:   Pt with PMH of HTN, DM, MS, recent quadriplegia, s/p decompression spinal surgery on C5, C6 and C7 (03/25/17) developing multiple issues post op including new Afib and prevertebral hematoma at surgical sight. Pt presents with abdominal pain found AKI  AKI continues --> Dialyzed today. Desated last night, was placed on BiPAP. Discussed with RN. Patient ate 50% today for breakfast today - french toast, oatmeal bacon and apple juice. Appetite continues to be so-so. Does not like glucerna - he is more open to ensure per RN. Will monitor labs. No other complaints at this time. Meal Completion: 50-75%  Labs reviewed:  Phos 5.9, BUN/Creatinine 66/5.64  Medications reviewed and include:  Decadron, Insulin, Remeron  NUTRITION - FOCUSED PHYSICAL EXAM:    Most Recent Value  Orbital Region  No depletion  Upper Arm Region  No depletion  Thoracic and Lumbar Region  No depletion  Buccal Region  No depletion  Temple Region  No depletion  Clavicle Bone Region  Mild depletion  Clavicle and Acromion Bone Region  No depletion  Scapular Bone Region  Unable to assess  Dorsal Hand  Mild depletion  Patellar Region  No depletion  Anterior Thigh Region  No depletion  Posterior Calf Region  No depletion  Edema (RD Assessment)  None       Diet Order:  Diet renal with fluid restriction Fluid restriction: 1200 mL Fluid; Room service appropriate? Yes; Fluid consistency: Thin  EDUCATION NEEDS:   Not  appropriate for education at this time  Skin:  Skin Assessment: Skin Integrity Issues: Skin Integrity Issues:: Unstageable Stage II: buttocks Unstageable: to buttocks  Last BM:  05/17/2017  Height:   Ht Readings from Last 1 Encounters:  05/05/17 6\' 4"  (1.93 m)    Weight:   Wt Readings from Last 1 Encounters:  05/18/17 288 lb 12.8 oz (131 kg)    Ideal Body Weight:  91.8 kg  BMI:  Body mass index is 35.15 kg/m.  Estimated Nutritional Needs:   Kcal:  2200-2400  Protein:  120-135 grams  Fluid:  >/= 2.2 L/d  Dionne Ano. Uniqua Kihn, MS, RD LDN Inpatient Clinical Dietitian Pager [phone]

## 2017-05-18 NOTE — Progress Notes (Signed)
Placed patient on NIV per desaturation and MD order, tolerating well at this moment SPO2 96%. RCP will continue to monitor.

## 2017-05-18 NOTE — Progress Notes (Signed)
PROGRESS NOTE    Dylan [name]  ZOX:[id number] DOB: [birth year] DOA: 04/13/2017 PCP: Patient, No Pcp Per      Brief Narrative:  Mr. Dylan [name] is a 52 y.o. male with a complicated recent PMH including cervical stenosis s/p C6 corpectomy with anterior instrumentation from C5-7 on 2/21 complicated by post op AFib, started on eliquis and subsequently developed complete motor quadriplegia at the level of C8 secondary to hematoma vs. abscess seen on MRI. He was also diagnosed with MS around that time.   Developed a high fever before discharge, fluid collection on MRI felt to be the cause.  Neurosurgery felt the risks greatly outweighed benefits of repeat exploratory surgery. He was discharged with plan for 6 weeks vancomycin/Cefepime through PICC to be completed on 4/14.    Unfortunately, patient presented to ED with abdominal pain on 3/30, found to have Cr of 5.68 and concerns for non-draining foley cath and vancomycin-related renal failure. Urology flushed foley at that time, removing foreign body, yielded 2.5L urine. Patient also noted to have K in excess of 6.4, given kayexelate. ID was consulted and recommended change to daptomycin and cefepime. Blood cultures grew C. glabrata, so PICC was removed for line holiday and anidulafungin was started. Neurology was consulted and recommended deferral of MS treatment to the outpatient setting as there was no evidence of an acute flare. Nephrology was consulted with progressive oliguric renal failure. HD cath placed 4/5 and HD started.     Assessment & Plan:   Acute renal failure At admission 3/30 had new renal failure: obstructive uropathy as well as vancomycin-induced renal injury. Nephrology are intermittently dialyzing since 4/5.  Has no TDC or permanent access at present. -Consult Nephrology     Severe thrombocytopenia HIT negative.  Likely drug related.  Most likely culprit seems to be Eraxis.  Eraxis stopped 5 days ago, after discussion with  Dr. Luciana Axe from ID.  Dapto and Cefepime stopped 2 days ago. Nephrology have proposed pantoprazole.     Overall trend continues down although still >10K.  Hematology following.  We do not have a specific threshold for transfusion, but with counts above 10K today and no clinical bleeding (other than the petechiae noted a few days ago), I will keep monitoring for now.   -Hematology are consulted, appreciate recommendations -Monitor CBC daily -Avoid heparins -If any clinical bleeding, will transfuse and discuss with Heme urgently    Quadriplegia Cervical abscess versus cervical hematoma versus infected cervical hematoma Completed 6 weeks antibiotics (4 weeks vancomycin/cefepime followed by 2 weeks daptomycin, cefepime) for infected cervical hematoma, completed on 4/14.  Neurogenic bladder Patient has had a foley since discharge 3/8, in the context of new quadruplegia.  Previous notes suggested this was "placed by Urology", or exchanged by Urology, but it appears Urology saw the patient only once, the night of this most recent admission 2 weeks ago for concerns of a non-flushing foley, they flushed it, and have had no further problems.   -In and out cath as needed  Candidemia Completed ophthalmology eval.  Had 10 days anidulafungin, now stopped due to thrombocytopenia.   Type 2 diabetes with nephropathy, on long-term insulin Glucose is well controlled -Continue Lantus, SSI  Paroxysmal atrial fibrillation CHADS2Vasc 1.  Anticoagulation contraindicated.  Intermittent periods of RVR.  Now resolved on its own -Continue amiodarone, diltiazem metoprolol  Adjustment disorder versus depression -Continue increased dose mirtazapine -Consult Psychiatry  Multiple sclerosis This was a finding on MRI at the time of his first admission. He  was treated with 5 days high dose steroids without improvement. Current recommendations per Neurology are to follow up as outpatient  -Follow up with Neurology as  outpatient.  Other medications -Continue Glucerna  Hyponatremia Mild  Anemia In context of severe illness. Stable.  Unstageable presure injury to bilateral buttocks, POA   Nocturnal hypoxia No prior history OSA or CPAP.  -Supplemental O2 at night -If desaturation, trial CPAP first, before BiPAP     DVT prophylaxis: SCDs Code Status: FULL Family Communication: Attempted call to cousin twice yesterday, no answer, will attempt again today. MDM and disposition Plan: Below labs and imaging reports reviewed and summarized above.  Medications continued as above.     The patient was admitted with new onset renal failure two weeks ago in the setting of quadriplegia from fluid correction around the spine as well as cervical myelopathy from degenerative disc disease leading to quadriplegia.    He also has a new thrombocytopenia that is persistent and severe, and potentially life-threatening if he were to have bleeding again.  Oncology following.  Eraxis stopped, monitoring.   At present, he is newly in renal failure, too early by far to determine whether he will have recovery of renal function.  LTACH would be the ideal placement setting for him if he were stable for discharge, so that his renal recovery could be managed in a seting that can handle a patient with quadruplegia and obesity.  HOWEVER, at present, he has worsening life threatening thrombocytopenia, which has not begun to recover.  This will preclude any considerations of placement for the forseeable future.       Consultants:   Ophtho  Neurology  Psychiatry  Nephrology  Infectious disease  Procedures:   Echocardiogram 4/4 Study Conclusions  - Left ventricle: The cavity size was normal. Wall thickness was   increased in a pattern of moderate LVH. Indeterminant diastolic   function (atrial fibrillation). The estimated ejection fraction   was 55%. Although no diagnostic regional wall motion abnormality   was  identified, this possibility cannot be completely excluded on   the basis of this study. - Aortic valve: There was no stenosis. - Mitral valve: Mildly calcified annulus. There was no significant   regurgitation. - Right ventricle: Poorly visualized. Probably normal size and   systolic function. - Pulmonary arteries: No complete TR doppler jet so unable to   estimate PA systolic pressure. - Inferior vena cava: The vessel was normal in size. The   respirophasic diameter changes were in the normal range (>= 50%),   consistent with normal central venous pressure.  Impressions:  - Technically difficult study with poor acoustic windows. Normal LV   size with moderate LV hypertrophy. EF 55%. Poorly visualized RV   but probably normal. No significant valvular abnormalities.      Subjective: No change.  Weak, depressed.  Overnight desaturation.  Again placed on BiPAP.  No gums bleeding, no epistaxis.  No fever, confusion.  No cough, vomiting.  Urine output through intermittent in and out cath is low.     Objective: Vitals:   05/18/17 1413 05/18/17 1541 05/18/17 1545 05/18/17 2008  BP:  131/79  130/69  Pulse:   82 79  Resp: 19  18 20   Temp:   98.5 F (36.9 C) 97.7 F (36.5 C)  TempSrc:   Oral Oral  SpO2:   91% 90%  Weight:      Height:        Intake/Output Summary (Last 24 hours)  at 05/18/2017 2105 Last data filed at 05/18/2017 1730 Gross per 24 hour  Intake 720 ml  Output 1550 ml  Net -830 ml   Filed Weights   05/18/17 0439 05/18/17 0845 05/18/17 1245  Weight: 131.5 kg (290 lb) 132 kg (291 lb 0.1 oz) 131 kg (288 lb 12.8 oz)    Examination: General appearance: Quadruplegic male, lying in bed, NAD, tired, flat affect.  HEENT: Anicteric, conjunctiv, lids lashes normal.  OP moist, gums normal, teeth normal, no oral lesions.  Hearing normal.     Skin: Skin warm and dry.  Scant petechiae on wrists, none on ankles, under SCDs, on abdomen.   Cardiac: RRR, no murmurs, no LE  pitting.   Respiratory: Respirations shallow, unlabored.  On Cecil.  No rales, coarse throughout. Abdomen: Soft, no tenderness, no HSM MSK: Flaccid legs, arms weak but moves, poor grip.  No large joint deformities or effusions. Neuro: Sensation diminisehd in legs, trunk.  Cranial nerves symmetric.       Psych: Depressed, affect sad.  Attention normal.  Alert and oriented to situation.    Data Reviewed: I have personally reviewed following labs and imaging studies:  CBC: Recent Labs  Lab 05/14/17 0824 05/15/17 0739 05/16/17 0723 05/17/17 0414 05/18/17 0459  WBC 13.4* 11.0* 6.4 8.0 6.8  HGB 10.2* 9.8* 8.9* 9.1* 8.7*  HCT 29.2* 28.6* 25.0* 25.9* 25.2*  MCV 83.9 83.4 82.8 84.4 83.4  PLT 30* 29* 38* 25* 15*   Basic Metabolic Panel: Recent Labs  Lab 05/14/17 0824 05/15/17 0739 05/16/17 0723 05/17/17 0414 05/18/17 0459  NA 133* 133* 133* 135 138  K 4.3 3.4* 4.5 4.0 4.3  CL 99* 99* 98* 101 103  CO2 20* 19* 20* 21* 21*  GLUCOSE 181* 135* 110* 134* 149*  BUN 66* 43* 49* 58* 66*  CREATININE 5.30* 3.96* 4.50* 5.04* 5.64*  CALCIUM 7.2* 7.1* 7.0* 7.0* 6.7*  PHOS 5.3* 4.3 5.0* 5.3* 5.9*   GFR: Estimated Creatinine Clearance: 23.4 mL/min (A) (by C-G formula based on SCr of 5.64 mg/dL (H)). Liver Function Tests: Recent Labs  Lab 05/14/17 0824 05/15/17 0739 05/16/17 0723 05/17/17 0414 05/18/17 0459  ALBUMIN 2.0* 2.0* 1.9* 1.9* 1.7*   No results for input(s): LIPASE, AMYLASE in the last 168 hours. No results for input(s): AMMONIA in the last 168 hours. Coagulation Profile: Recent Labs  Lab 05/12/17 0957  INR 1.27   Cardiac Enzymes: Recent Labs  Lab 05/14/17 0824  CKTOTAL 92   BNP (last 3 results) No results for input(s): PROBNP in the last 8760 hours. HbA1C: No results for input(s): HGBA1C in the last 72 hours. CBG: Recent Labs  Lab 05/17/17 1620 05/17/17 2145 05/18/17 0643 05/18/17 1351 05/18/17 1641  GLUCAP 115* 177* 154* 131* 144*   Lipid Profile: No  results for input(s): CHOL, HDL, LDLCALC, TRIG, CHOLHDL, LDLDIRECT in the last 72 hours. Thyroid Function Tests: No results for input(s): TSH, T4TOTAL, FREET4, T3FREE, THYROIDAB in the last 72 hours. Anemia Panel: No results for input(s): VITAMINB12, FOLATE, FERRITIN, TIBC, IRON, RETICCTPCT in the last 72 hours. Urine analysis:    Component Value Date/Time   COLORURINE YELLOW May 10, 2017 0920   APPEARANCEUR TURBID (A) 05/10/17 0920   LABSPEC 1.015 05-10-17 0920   PHURINE 6.0 10-May-2017 0920   GLUCOSEU NEGATIVE 05/10/17 0920   HGBUR LARGE (A) 05/10/17 0920   BILIRUBINUR NEGATIVE 05/10/2017 0920   KETONESUR NEGATIVE 2017-05-10 0920   PROTEINUR 30 (A) 05/10/2017 0920   NITRITE NEGATIVE May 10, 2017 0920   LEUKOCYTESUR  MODERATE (A) May 23, 2017 0920   Sepsis Labs: @LABRCNTIP (procalcitonin:4,lacticacidven:4)  ) No results found for this or any previous visit (from the past 240 hour(s)).       Radiology Studies: No results found.      Scheduled Meds: . amiodarone  200 mg Oral Daily  . collagenase   Topical Daily  . dexamethasone  3 mg Oral Q8H  . diltiazem  60 mg Oral Q8H  . feeding supplement (ENSURE ENLIVE)  237 mL Oral BID BM  . guaiFENesin  600 mg Oral BID  . insulin aspart  0-5 Units Subcutaneous QHS  . insulin aspart  0-9 Units Subcutaneous TID WC  . insulin aspart  2 Units Subcutaneous TID WC  . insulin glargine  12 Units Subcutaneous Daily  . metoprolol tartrate  12.5 mg Oral BID  . mirtazapine  30 mg Oral QHS   Continuous Infusions:    LOS: 17 days    Time spent: 25 minutes   Alberteen Sam, MD [facility] 05/18/2017, 3:45 PM    Pager [phone] --- please page though AMION:  [email or web address] Password [email or web address] If 7PM-7AM, please contact night-coverage

## 2017-05-18 NOTE — Progress Notes (Signed)
Daily Progress Note   Patient Name: Dylan [name]       Date: 05/18/2017 DOB: Jun 14, 1965  Age: 52 y.o. MRN#: [id number] Attending Physician: Edwin Dada, * Primary Care Physician: Patient, No Pcp Per Admit Date: 04/22/2017  Reason for Consultation/Follow-up: Establishing goals of care  Subjective: Sitting up in chair - tells me it feels good to be out of bed.  Length of Stay: 17  Current Medications: Scheduled Meds:  . amiodarone  200 mg Oral Daily  . collagenase   Topical Daily  . dexamethasone  3 mg Oral Q8H  . diltiazem  60 mg Oral Q8H  . feeding supplement (GLUCERNA SHAKE)  237 mL Oral TID BM  . guaiFENesin  600 mg Oral BID  . insulin aspart  0-5 Units Subcutaneous QHS  . insulin aspart  0-9 Units Subcutaneous TID WC  . insulin aspart  2 Units Subcutaneous TID WC  . insulin glargine  12 Units Subcutaneous Daily  . metoprolol tartrate  12.5 mg Oral BID  . mirtazapine  30 mg Oral QHS  . pantoprazole  40 mg Oral Daily    Continuous Infusions:   PRN Meds: acetaminophen, benzonatate, bisacodyl, ipratropium-albuterol, labetalol, lidocaine (PF), LORazepam, methocarbamol, ondansetron (ZOFRAN) IV, ondansetron, sodium chloride flush, traZODone  Physical Exam  Constitutional: He is oriented to person, place, and time. No distress.  HENT:  Head: Normocephalic and atraumatic.  Cardiovascular: Normal rate, regular rhythm and intact distal pulses.  Pulmonary/Chest: Effort normal and breath sounds normal. No respiratory distress.  On 4L [place]  Abdominal: Soft.  Musculoskeletal:  quadriplegia  Neurological: He is alert and oriented to person, place, and time.  Skin: Skin is warm and dry. Capillary refill takes less than 2 seconds. He is not diaphoretic.  Psychiatric: He has a normal  mood and affect. He is slowed. Cognition and memory are normal. He is attentive.            Vital Signs: BP 103/76   Pulse 63   Temp 98.9 F (37.2 C) (Oral)   Resp 19   Ht 6' 4"  (1.93 m)   Wt 132 kg (291 lb 0.1 oz)   SpO2 94%   BMI 35.42 kg/m  SpO2: SpO2: 94 % O2 Device: O2 Device: Nasal Cannula O2 Flow Rate: O2 Flow Rate (L/min): 4 L/min  Intake/output summary:   Intake/Output Summary (Last 24 hours) at 05/18/2017 1054 Last data filed at 05/18/2017 0203 Gross per 24 hour  Intake 120 ml  Output 970 ml  Net -850 ml   LBM: Last BM Date: 05/17/17(per Pt.) Baseline Weight: Weight: 111.6 kg (246 lb) Most recent weight: Weight: 132 kg (291 lb 0.1 oz)       Palliative Assessment/Data: PPS 30%    Flowsheet Rows     Most Recent Value  Intake Tab  Referral Department  Hospitalist  Unit at Time of Referral  Cardiac/Telemetry Unit  Palliative Care Primary Diagnosis  Neurology  Date Notified  05/13/17  Palliative Care Type  Return patient Palliative Care  Reason for referral  Clarify Goals of Care  Date of Admission  04/06/2017  Date first seen by Palliative Care  05/14/17  # of days Palliative referral response  time  1 Day(s)  # of days IP prior to Palliative referral  12  Clinical Assessment  Palliative Performance Scale Score  30%  Psychosocial & Spiritual Assessment  Palliative Care Outcomes  Patient/Family meeting held?  Yes  Who was at the meeting?  patient  Palliative Care Outcomes  Clarified goals of care      Patient Active Problem List   Diagnosis Date Noted  . Goals of care, counseling/discussion   . Palliative care by specialist   . Candidemia ([place])   . MDD (major depressive disorder), single episode, moderate ([place])   . Quadriplegia ([place]) 05/02/2017  . History of fusion of cervical spine 05/02/2017  . Postoperative wound infection 05/02/2017  . Coronary artery disease 05/02/2017  . Atherosclerotic peripheral vascular disease ([place]) 05/02/2017  .  Adjustment reaction with anxiety and depression 04/30/2017  . Neurogenic bladder 04/22/2017  . Insomnia 04/22/2017  . GERD without esophagitis 04/15/2017  . Insulin dependent type 2 diabetes mellitus, uncontrolled ([place]) 04/15/2017  . Hematoma 04/12/2017  . Hyperkalemia 04/08/2017  . AKI (acute kidney injury) ([place]) 04/08/2017  . Pressure injury of skin 04/03/2017  . Multiple sclerosis exacerbation ([place]) 03/30/2017  . Paroxysmal atrial fibrillation ([facility])   . Spinal stenosis, multiple sites in spine 03/23/2017  . Morbid obesity ([place]) 03/22/2017  . Uncontrolled diabetes mellitus type 2 without complications ([place]) 35/36/1443  . Hypertension 03/21/2017    Palliative Care Assessment & Plan   HPI: 52 y.o. male  with past medical history of DM and obesity admitted on 04/06/2017 with abdominal pain. Recently admitted to hospital 03/21/08-04/10/17 d/t cervical stenosis. S/p C6 corpectomy on 1/54/00 complicated by post op a fib, started on eliquis and subsequently developed complete motor quadriplegia secondary to hematoma vs abscess. He was discharged to SNF on IV vancomycin d/t complicated UTI and possible cervical abscess. Vancomycin was stopped d/t rising creatinine.  Creatinine was 5.68 during this admission. Since admission pt has experienced progressive oliguric renal failure. Hemodialysis started. Hospital course further complicated with candidemia and worsening thrombocytopenia.  PMT consulted for [place].  Assessment: Follow -up meeting to discuss [place] again. Last meeting was during dialysis session so we met again to clarify [place] and address questions and concerns.  Patient confirms what he told me during our last meeting. Tells me "I want to live" and he wants whatever interventions are necessary. He tells me "I don't have any other options". We discussed that he does have options and discussed differences between aggressive medical interventions and comfort measures. His goals are in line with  aggressive medical treatment. We discussed potential of out of state placement at SNF and he is okay with this if it is necessary.    I reviewed advance directives that patient had completed in March with patient. He confirms that his cousin, Carlyon Shadow, is his 66 and his sister, Tressia Miners is second HCPOA. We discussed during last admission he elected to be DNR and now he is FULL code. He tells me he did not realize he was ever a DNR and those were not his wishes.   He tells me his main concern is his appetite - he tells me he has an appetite and desires to eat but does not like the food options offered d/t renal diet. Asking to speak to dietitian about food options.   Denies pain, nausea, constipation.  Recommendations/Plan: Full code/full scope treatment Living will in chart and confirmed w/patient  PMT will follow Dietitian consult for patient education about renal diet Patient would  benefit from outpatient palliative wherever he is placed  Goals of Care and Additional Recommendations:  Limitations on Scope of Treatment: Full Scope Treatment  Code Status:  Full code  Prognosis:   Unable to determine  Discharge Planning:  To Be Determined LTACH vs SNF  Care plan was discussed with patient  Thank you for allowing the Palliative Medicine Team to assist in the care of this patient.   Time In: 1045 Time Out: 1120 Total Time 35 minutes Prolonged Time Billed  no       Greater than 50%  of this time was spent counseling and coordinating care related to the above assessment and plan.  Juel Burrow, DNP, AGNP-C Palliative Medicine Team Team Phone # [phone]

## 2017-05-18 NOTE — Progress Notes (Signed)
Pt destating into the 70's. Consulted RT and paged Donnamarie Poag for orders for Bipap. Pt was on Bipap last night for the same issue.

## 2017-05-18 NOTE — Progress Notes (Signed)
Patient ID: Dylan [name], male   DOB: [birth year], 52 y.o.   MRN: [id number] [facility] Progress Note   Assessment/ Plan:    1. Acute kidney injury: Suspected secondary to nephrotoxic ATN from supratherapeutic vancomycin levels.He unfortunately remains anuric and without any renal recovery- HD today 4/16, next planned for 4/18.  Getting irrigation of urethral catheter intermittently.  Unfortunately with his quadriplegia and high amount of assistance required, will need placement out of state if continues to need HD.  I'm ordering vein mapping.  2. Atrial fibrillation: Currently rate controlled on diltiazem/metoprolol and amiodarone.    3. Candidemia: s/p andilafungin  4.  Cervical abscess versus hematoma: off dapto and cefepime.   5.  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  Urinary retention: Secondary to blockage of Foley catheter-underwent exchange by urology.  7.  Thrombocytopenia: plts down to 15, off dapto/cefepime/ andilafungin.  I'll stop Protonix in case that's contributing.  HIT neg 4/12.    Subjective:    NAD, tolerated HD today.  Platelets are down at 15.    Objective:   BP (!) 141/80 (BP Location: Right Arm)   Pulse 78   Temp 98.2 F (36.8 C) (Oral)   Resp 19   Ht 6\' 4"  (1.93 m)   Wt 131 kg (288 lb 12.8 oz)   SpO2 92%   BMI 35.15 kg/m   Intake/Output Summary (Last 24 hours) at 05/18/2017 1534 Last data filed at 05/18/2017 1414 Gross per 24 hour  Intake 600 ml  Output 2170 ml  Net -1570 ml   Weight change: -6.804 kg (-15 lb)   Physical Exam: Gen: Resting comfortably in bed, oxygen via nasal cannula CVS: Pulse regular rhythm, normal rate, S1 and S2 normal Resp: Anteriorly clear to auscultation without any distinct rales/rhonchi Abd: Soft, obese, nontender Ext: 1+ ankle edema MSK: +sarcopenia ACCESS: R IJ TDC  Imaging: No results found.  Labs: BMET Recent Labs  Lab 05/12/17 0527  05/13/17 0747 05/14/17 0824 05/15/17 0739 05/16/17 0723 05/17/17 0414 05/18/17 0459  NA 134* 134* 133* 133* 133* 135 138  K 3.9 4.2 4.3 3.4* 4.5 4.0 4.3  CL 98* 100* 99* 99* 98* 101 103  CO2 23 21* 20* 19* 20* 21* 21*  GLUCOSE 148* 171* 181* 135* 110* 134* 149*  BUN 44* 56* 66* 43* 49* 58* 66*  CREATININE 4.17* 4.77* 5.30* 3.96* 4.50* 5.04* 5.64*  CALCIUM 7.5* 7.3* 7.2* 7.1* 7.0* 7.0* 6.7*  PHOS 4.5 5.1* 5.3* 4.3 5.0* 5.3* 5.9*   CBC Recent Labs  Lab 05/15/17 0739 05/16/17 0723 05/17/17 0414 05/18/17 0459  WBC 11.0* 6.4 8.0 6.8  HGB 9.8* 8.9* 9.1* 8.7*  HCT 28.6* 25.0* 25.9* 25.2*  MCV 83.4 82.8 84.4 83.4  PLT 29* 38* 25* 15*    Medications:    . amiodarone  200 mg Oral Daily  . collagenase   Topical Daily  . dexamethasone  3 mg Oral Q8H  . diltiazem  60 mg Oral Q8H  . feeding supplement (GLUCERNA SHAKE)  237 mL Oral TID BM  . guaiFENesin  600 mg Oral BID  . insulin aspart  0-5 Units Subcutaneous QHS  . insulin aspart  0-9 Units Subcutaneous TID WC  . insulin aspart  2 Units Subcutaneous TID WC  . insulin glargine  12 Units Subcutaneous Daily  . metoprolol tartrate  12.5 mg Oral BID  . mirtazapine  30 mg Oral QHS   Bufford Buttner, MD [facility] pgr [phone] 05/18/2017, 3:34 PM

## 2017-05-18 NOTE — Progress Notes (Signed)
Patient left for HD at this time.    Sim Boast, RN

## 2017-05-18 NOTE — Progress Notes (Signed)
Patient returned from HD at this time. VSS. Patient denied any distress. Bladder scanned of 132 ML. Pt denied urge to void at this time. Will continue to monitor.   Sim Boast, RN

## 2017-05-19 ENCOUNTER — Inpatient Hospital Stay (HOSPITAL_COMMUNITY): Payer: Medicaid Other

## 2017-05-19 DIAGNOSIS — Z01810 Encounter for preprocedural cardiovascular examination: Secondary | ICD-10-CM

## 2017-05-19 LAB — CBC
HEMATOCRIT: 25 % — AB (ref 39.0–52.0)
Hemoglobin: 8.6 g/dL — ABNORMAL LOW (ref 13.0–17.0)
MCH: 29.7 pg (ref 26.0–34.0)
MCHC: 34.4 g/dL (ref 30.0–36.0)
MCV: 86.2 fL (ref 78.0–100.0)
PLATELETS: 15 10*3/uL — AB (ref 150–400)
RBC: 2.9 MIL/uL — AB (ref 4.22–5.81)
RDW: 15.2 % (ref 11.5–15.5)
WBC: 6.8 10*3/uL (ref 4.0–10.5)

## 2017-05-19 LAB — GLUCOSE, CAPILLARY
GLUCOSE-CAPILLARY: 157 mg/dL — AB (ref 65–99)
GLUCOSE-CAPILLARY: 103 mg/dL — AB (ref 65–99)
Glucose-Capillary: 136 mg/dL — ABNORMAL HIGH (ref 65–99)
Glucose-Capillary: 120 mg/dL — ABNORMAL HIGH (ref 65–99)

## 2017-05-19 NOTE — Progress Notes (Signed)
Visit made to patients room to place patient on CPAP.  Pt states he does not want to wear tonight.  I advised patient if he changes his mind to let me know.

## 2017-05-19 NOTE — Progress Notes (Signed)
Patient ID: Dylan [name], male   DOB: [birth year], 52 y.o.   MRN: [id number]  [facility] Progress Note   Assessment/ Plan:    1. Acute kidney injury: Suspected secondary to nephrotoxic ATN from supratherapeutic vancomycin levels.He unfortunately remains anuric and without any renal recovery- HD today 4/16, next planned for 4/18.  Getting irrigation of urethral catheter intermittently.  Unfortunately with his quadriplegia and high amount of assistance required, will need placement out of state if continues to need HD if he can't sit in the chair.  He is willing to try to sit but he won't be able to transfer and will likely need [facility] lift.    2. Atrial fibrillation: Currently rate controlled on diltiazem/metoprolol and amiodarone.    3. Candidemia: s/p andilafungin  4.  Cervical abscess versus hematoma: off dapto and cefepime.   5.  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  Urinary retention: Secondary to blockage of Foley catheter-underwent exchange by urology.  7.  Thrombocytopenia: plts down to 15, off dapto/cefepime/ andilafungin.   Protonix d/c'd in case that's contributing.  HIT neg 4/12.  Appreciate heme weighing in.  Subjective:    Plts still 15.  Likely drug-induced, expect slow recovery per heme.  S/p vein mapping today  Objective:   BP 114/72 (BP Location: Right Arm)   Pulse 69   Temp 98.5 F (36.9 C) (Oral)   Resp (!) 22   Ht 6\' 4"  (1.93 m)   Wt 131.5 kg (290 lb)   SpO2 91%   BMI 35.30 kg/m   Intake/Output Summary (Last 24 hours) at 05/19/2017 1522 Last data filed at 05/19/2017 1218 Gross per 24 hour  Intake 720 ml  Output -  Net 720 ml   Weight change: 0.457 kg (1 lb 0.1 oz)   Physical Exam: Gen: Resting comfortably in bed, oxygen via nasal cannula CVS: Pulse regular rhythm, normal rate, S1 and S2 normal Resp: Anteriorly clear to auscultation without any distinct rales/rhonchi Abd: Soft,  obese, nontender Ext: 1+ ankle edema MSK: +sarcopenia ACCESS: R IJ NONTUNNELED HD catheter  Imaging: No results found.  Labs: BMET Recent Labs  Lab 05/13/17 0747 05/14/17 0824 05/15/17 0739 05/16/17 0723 05/17/17 0414 05/18/17 0459  NA 134* 133* 133* 133* 135 138  K 4.2 4.3 3.4* 4.5 4.0 4.3  CL 100* 99* 99* 98* 101 103  CO2 21* 20* 19* 20* 21* 21*  GLUCOSE 171* 181* 135* 110* 134* 149*  BUN 56* 66* 43* 49* 58* 66*  CREATININE 4.77* 5.30* 3.96* 4.50* 5.04* 5.64*  CALCIUM 7.3* 7.2* 7.1* 7.0* 7.0* 6.7*  PHOS 5.1* 5.3* 4.3 5.0* 5.3* 5.9*   CBC Recent Labs  Lab 05/16/17 0723 05/17/17 0414 05/18/17 0459 05/19/17 0907  WBC 6.4 8.0 6.8 6.8  HGB 8.9* 9.1* 8.7* 8.6*  HCT 25.0* 25.9* 25.2* 25.0*  MCV 82.8 84.4 83.4 86.2  PLT 38* 25* 15* 15*    Medications:    . amiodarone  200 mg Oral Daily  . collagenase   Topical Daily  . dexamethasone  3 mg Oral Q8H  . diltiazem  60 mg Oral Q8H  . feeding supplement (ENSURE ENLIVE)  237 mL Oral BID BM  . guaiFENesin  600 mg Oral BID  . insulin aspart  0-5 Units Subcutaneous QHS  . insulin aspart  0-9 Units Subcutaneous TID WC  . insulin aspart  2 Units Subcutaneous TID WC  . insulin glargine  12 Units Subcutaneous Daily  .  metoprolol tartrate  12.5 mg Oral BID  . mirtazapine  30 mg Oral QHS   Bufford Buttner, MD [facility] pgr [phone] 05/19/2017, 3:22 PM

## 2017-05-19 NOTE — Progress Notes (Signed)
Vascular lab progress note. Attempted to get patient for vein mapping. Patient wants to eat lunch first.  Will try again as schedule permits. Farrel Demark, RDMS, RVT

## 2017-05-19 NOTE — Progress Notes (Signed)
CSW met with patient at bedside. Patient alert and oriented. CSW discussed SNF placement. Now given patient's medical needs, patient will likely need SNF placement out of state, as there are no facilities in state that can accommodate his needs. Patient indicated he is agreeable to send referrals out of state.   CSW consulting with clinical supervisor and RNCM for placement. CSW to identify facilities and send referrals, and will continue to follow.  Dylan [name], [place]

## 2017-05-19 NOTE — Progress Notes (Addendum)
PROGRESS NOTE    Dylan [name]  OQH:[id number] DOB: 1965-03-04 DOA: 04/30/2017 PCP: Patient, No Pcp Per   Brief Narrative:  HPI on 04/18/2017 by Dr. Daiva Nakayama is a 52 y.o. male with medical history significant of recent quadriplegia, MS, decompression of spinal abscess admitted with abdominal pain and hypoglycemia.  He was diagnosed with MS and spinal stenosis .  He was admitted to neurosurgery 04/12/2017.  He had surgery at C6 C5 and C7.  Postoperative course was complicated with atrial fibrillation placed on Eliquis followed by which epidural clot versus abscess was seen on his C-spine MRI.  Because of the high risk patient decided not to repeat exploratory surgery.  He was started on IV antibiotics due to complicated UTI possible cervical abscess and was discharged to skilled nursing facility on 04/09/2017 patient was supposed to be on vancomycin and cefepime until May 06, 2017.  But the vancomycin was stopped earlier due to increasing creatinine. Patient was found to have gradual progressive swelling of his neck a CT scan at that time showed prevertebral hematoma. When patient came in today his Foley catheter was not draining.  Urology was consulted.  After seeing the Foley a small piece of tissue was removed which was occluding the catheter.  Then he put out 2.5 L.  ED physician also spoke to nephrologist who would be standby if needed as a consult.  Interim history Patient was readmitted for acute kidney injury with concerns that his Foley catheter was on training and possible vancomycin related renal injury.  Urology was consulted and flushed Foley at that time, removing foreign body yielding 2.5 L of urine output.  Patient was also found to have hyperkalemia given Kayexalate.  Infectious disease recommended continuation of daptomycin and cefepime which has now been completed.  Blood cultures grew C. [place]- PICC line was removed and patient started on anidulafungin.   Neurology was consulted and deferred MS treatment to the outpatient setting.  Nephrology consulted due to progressive oliguric renal failure, HD started. Found to have thrombocytopenia.   Assessment & Plan   Acute renal failure -Thought to be secondary to obstructive uropathy versus vancomycin-induced renal injury -Nephrology consulted and appreciated, patient has been dialyzing since 05/07/2017 -Nephrology ordering vein mapping -discussed with Dr. Hollie Salk, patient would likely need stretcher dialysis and this would have to done out of state. Pending results of platelets this morning. Patient currently only has RIJ access (nontunneled HD). Patient may need platelet transfusion for more permanent access to be placed. Will continue to monitor. No HD planned for today, likely on 05/20/2017  Severe thrombocytopenia -Platelets pending this morning, was 15 on 05/18/2017 -Thought to be drug related, Eraxis discontinued 5 days ago.  Daptomycin as well as cefepime are also stopped several days ago.  Nephrology discontinued Protonix. -HIT negative -Patient does not receive heparin with dialysis -Hematology consulted and appreciated-states resolution and recovery could take up to 7-10 days -Currently no clinical bleeding, though patient does have petechiae noted on physical exam -Continue to monitor CBC, transfuse if platelets drop below 10,000  Quadriplegia/cervical abscess versus cervical hematoma versus infected cervical hematoma -Patient completed 6 weeks of antibiotics including 4 weeks of vancomycin and cefepime followed by 2 weeks of daptomycin and cefepime, course completed on 05/16/2017  Neurogenic bladder -Patient was discharged with a Foley catheter on 04/09/2017 in the context of new quadriplegia -Urology consulted and appreciated, Foley catheter was flushed yielding 2.5 L  Candidemia -Patient had 10 days of anidulafungin (discontinued  due to thrombocytopenia) -Ophthalmology consulted, found no  evidence of intraocular fungal involvement -Echocardiogram showed no vegetation  Diabetes mellitus, type II with nephropathy -Continue Lantus, insulin sliding scale and CBG monitoring  Paroxysmal atrial fibrillation -CHADSVASC 1 -Given thrombus cytopenia, anticoagulation contraindicated -Continue amiodarone, metoprolol, diltiazem   Multiple sclerosis -Recently diagnosed and was given 5 days of high-dose steroids with improvement (previous admission) -Neurology consulted and appreciated recommended outpatient follow-up  Adjustment disorder versus depression -Continue mirtazapine -Psychiatry consulted and appreciated, recommended trazodone   Hyponatremia -Resolved, continue to monitor BMP  Anemia secondary to chronic disease -Hemoglobin 8.6 -Continue to monitor CBC  Unstageable pressure injury to the bilateral buttocks -Present on admission -Continue wound care  Nocturnal hypoxia -No history of obstructive sleep apnea or the use of CPAP -Continue supplemental oxygen at night  Goals of care -Palliative care consulted and appreciated-patient does have a living will in the chart  DVT Prophylaxis  SCDs  Code Status: Full  Family Communication:  Disposition Plan: Admitted, pending improvement in thrombocytopenia as well as decision regarding dialysis.  Consultants Nephrology Neurology Psychiatry Infectious disease Ophthalmology Palliative care  Procedures  Echocardiogram  Antibiotics   Anti-infectives (From admission, onward)   Start     Dose/Rate Route Frequency Ordered Stop   05/08/17 2000  DAPTOmycin (CUBICIN) 900 mg in sodium chloride 0.9 % IVPB     900 mg 236 mL/hr over 30 Minutes Intravenous Every 48 hours 05/07/17 1114 05/16/17 2230   05/05/17 2000  anidulafungin (ERAXIS) 100 mg in sodium chloride 0.9 % 100 mL IVPB  Status:  Discontinued     100 mg 78 mL/hr over 100 Minutes Intravenous Every 24 hours 05/04/17 1908 05/14/17 1751   05/04/17 2000   anidulafungin (ERAXIS) 200 mg in sodium chloride 0.9 % 200 mL IVPB     200 mg 78 mL/hr over 200 Minutes Intravenous  Once 05/04/17 1908 05/05/17 0000   05/02/17 1200  DAPTOmycin (CUBICIN) 1,000 mg in sodium chloride 0.9 % IVPB  Status:  Discontinued     1,000 mg 240 mL/hr over 30 Minutes Intravenous Every 48 hours 05/02/17 1143 05/07/17 1114   04/09/2017 1900  ceFEPIme (MAXIPIME) 1 g in sodium chloride 0.9 % 100 mL IVPB     1 g 200 mL/hr over 30 Minutes Intravenous Daily after supper 04/24/2017 1833 05/16/17 1850   04/05/2017 1800  ceFEPime (MAXIPIME) IVPB  Status:  Discontinued    Note to Pharmacy:  Indication:  Spinal abscess Last Day of Therapy:  05/16/2017 Labs - Once weekly:  CBC/D and BMP, Labs - Every other week:  ESR and CRP     2 g Intravenous Every 12 hours 04/29/2017 1757 04/18/2017 1833   04/28/2017 1245  vancomycin (VANCOCIN) 2,000 mg in sodium chloride 0.9 % 500 mL IVPB     2,000 mg 250 mL/hr over 120 Minutes Intravenous  Once 04/21/2017 1231 05/02/2017 1757   04/25/2017 1230  piperacillin-tazobactam (ZOSYN) IVPB 3.375 g     3.375 g 100 mL/hr over 30 Minutes Intravenous  Once 04/29/2017 1217 04/19/2017 1407   04/03/2017 1230  vancomycin (VANCOCIN) IVPB 1000 mg/200 mL premix  Status:  Discontinued     1,000 mg 200 mL/hr over 60 Minutes Intravenous  Once 04/19/2017 1217 04/10/2017 1231      Subjective:   Meredith Efaw seen and examined today.  No complaints this morning.  Wonders if he will have dialysis today.  Denies chest pain, shortness of breath, abdominal pain, nausea or vomiting.  Objective:   Vitals:  05/19/17 0352 05/19/17 0640 05/19/17 0813 05/19/17 1109  BP: 131/81  133/79 114/72  Pulse: 73  62 69  Resp: 20  (!) 21 (!) 22  Temp: 97.7 F (36.5 C)  98 F (36.7 C) 98.5 F (36.9 C)  TempSrc: Oral  Oral Oral  SpO2: 93%  (!) 86% 91%  Weight:  131.5 kg (290 lb)    Height:        Intake/Output Summary (Last 24 hours) at 05/19/2017 1145 Last data filed at 05/18/2017 1730 Gross per  24 hour  Intake 480 ml  Output 1200 ml  Net -720 ml   Filed Weights   05/18/17 0845 05/18/17 1245 05/19/17 0640  Weight: 132 kg (291 lb 0.1 oz) 131 kg (288 lb 12.8 oz) 131.5 kg (290 lb)    Exam  General: Well developed, chronically ill-appearing, no apparent distress  HEENT: NCAT, mucous membranes moist.   Neck: Supple  Cardiovascular: S1 S2 auscultated, no rubs, murmurs or gallops. Regular rate and rhythm.  Respiratory: Clear to auscultation bilaterally with equal chest rise  Abdomen: Soft, nontender, nondistended, + bowel sounds  Extremities: warm dry without cyanosis clubbing. No pitting edema  Neuro: AAOx3, quadriplegic  Psych: flat affect, depressed mood   Data Reviewed: I have personally reviewed following labs and imaging studies  CBC: Recent Labs  Lab 05/15/17 0739 05/16/17 0723 05/17/17 0414 05/18/17 0459 05/19/17 0907  WBC 11.0* 6.4 8.0 6.8 6.8  HGB 9.8* 8.9* 9.1* 8.7* 8.6*  HCT 28.6* 25.0* 25.9* 25.2* 25.0*  MCV 83.4 82.8 84.4 83.4 86.2  PLT 29* 38* 25* 15* PENDING   Basic Metabolic Panel: Recent Labs  Lab 05/14/17 0824 05/15/17 0739 05/16/17 0723 05/17/17 0414 05/18/17 0459  NA 133* 133* 133* 135 138  K 4.3 3.4* 4.5 4.0 4.3  CL 99* 99* 98* 101 103  CO2 20* 19* 20* 21* 21*  GLUCOSE 181* 135* 110* 134* 149*  BUN 66* 43* 49* 58* 66*  CREATININE 5.30* 3.96* 4.50* 5.04* 5.64*  CALCIUM 7.2* 7.1* 7.0* 7.0* 6.7*  PHOS 5.3* 4.3 5.0* 5.3* 5.9*   GFR: Estimated Creatinine Clearance: 23.5 mL/min (A) (by C-G formula based on SCr of 5.64 mg/dL (H)). Liver Function Tests: Recent Labs  Lab 05/14/17 0824 05/15/17 0739 05/16/17 0723 05/17/17 0414 05/18/17 0459  ALBUMIN 2.0* 2.0* 1.9* 1.9* 1.7*   No results for input(s): LIPASE, AMYLASE in the last 168 hours. No results for input(s): AMMONIA in the last 168 hours. Coagulation Profile: No results for input(s): INR, PROTIME in the last 168 hours. Cardiac Enzymes: Recent Labs  Lab  05/14/17 0824  CKTOTAL 92   BNP (last 3 results) No results for input(s): PROBNP in the last 8760 hours. HbA1C: No results for input(s): HGBA1C in the last 72 hours. CBG: Recent Labs  Lab 05/18/17 1351 05/18/17 1641 05/18/17 2117 05/19/17 0618 05/19/17 1112  GLUCAP 131* 144* 156* 136* 157*   Lipid Profile: No results for input(s): CHOL, HDL, LDLCALC, TRIG, CHOLHDL, LDLDIRECT in the last 72 hours. Thyroid Function Tests: No results for input(s): TSH, T4TOTAL, FREET4, T3FREE, THYROIDAB in the last 72 hours. Anemia Panel: No results for input(s): VITAMINB12, FOLATE, FERRITIN, TIBC, IRON, RETICCTPCT in the last 72 hours. Urine analysis:    Component Value Date/Time   COLORURINE YELLOW 04/19/2017 0920   APPEARANCEUR TURBID (A) 04/26/2017 0920   LABSPEC 1.015 04/15/2017 0920   PHURINE 6.0 04/27/2017 0920   GLUCOSEU NEGATIVE 04/20/2017 0920   HGBUR LARGE (A) 04/29/2017 0920   BILIRUBINUR NEGATIVE 04/13/2017  [place] 04/07/2017 0920   PROTEINUR 30 (A) 04/14/2017 0920   NITRITE NEGATIVE 04/28/2017 0920   LEUKOCYTESUR MODERATE (A) 04/27/2017 0920   Sepsis Labs: @LABRCNTIP (procalcitonin:4,lacticidven:4)  )No results found for this or any previous visit (from the past 240 hour(s)).    Radiology Studies: No results found.   Scheduled Meds: . amiodarone  200 mg Oral Daily  . collagenase   Topical Daily  . dexamethasone  3 mg Oral Q8H  . diltiazem  60 mg Oral Q8H  . feeding supplement (ENSURE ENLIVE)  237 mL Oral BID BM  . guaiFENesin  600 mg Oral BID  . insulin aspart  0-5 Units Subcutaneous QHS  . insulin aspart  0-9 Units Subcutaneous TID WC  . insulin aspart  2 Units Subcutaneous TID WC  . insulin glargine  12 Units Subcutaneous Daily  . metoprolol tartrate  12.5 mg Oral BID  . mirtazapine  30 mg Oral QHS   Continuous Infusions:   LOS: 18 days   Time Spent in minutes   45 minutes (greater than 50% of this time was spent with the patient as well  as reviewing patient's chart and formulating a plan)  Cristal Ford D.O. on 05/19/2017 at 11:45 AM  Between 7am to 7pm - Pager - [phone]  After 7pm go to [email or web address] - password [email or web address]  And look for the night coverage person covering for me after hours  [facility] Office  [phone]

## 2017-05-19 NOTE — Progress Notes (Signed)
Vascular tech is here to transport pt to Vasc lab at this time, MD at bedside and pt request to eat lunch. RN spoke to [place] and will reschedule pt for after 12pm at this time.    Thomasene Mohair, RN

## 2017-05-19 NOTE — Progress Notes (Signed)
Vein mapping completed.  Right cephalic   Right Basilic (DIAMETER MM) Ax      1.77      M upper   2.23    3.01 D upper   2.09    3.63 AC       4.09   3.13 Below AC 3.09   2.23 FA       1.86 WR       2.17  Left Basilic M upper 9.79 D upper 4.80 AC         3.72 Below AC 2.39    Farrel Demark, RDMS, RVT

## 2017-05-19 NOTE — Progress Notes (Signed)
Occupational Therapy Treatment Patient Details Name: Dylan [name] MRN: [id number] DOB: 05-25-1965 Today's Date: 05/19/2017    History of present illness Dylan [name] is a 52yo black male who comes to [facility] on 3/30 with ABD pain and hypoglycemia, admitted with cervical abcess v hematoma s/p C five-seven surgery on . 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 He DC to SNF ~3WA with motor weakness C7 down, full sensation intact. He reports graual worsening of manipulative use with smartphone over those three weaks.     OT comments  Pt making slow progress with functional goals. Pt unable to answer questions accurately this session about PLOF, place and situation. Pt participated in grooming and B UE exercises. Pt very pleasant and cooperative. OT will continue to follow acutely  Follow Up Recommendations  SNF;Supervision/Assistance - 24 hour    Equipment Recommendations  Other (comment)(TBD at next venue of care)    Recommendations for Other Services      Precautions / Restrictions Precautions Precautions: Fall;Cervical Restrictions Weight Bearing Restrictions: No       Mobility Bed Mobility               General bed mobility comments: pt up in recliner upon arrival  Transfers Overall transfer level: Needs assistance               General transfer comment: pt up in recliner upon arrival with nursing staff    Balance Overall balance assessment: Needs assistance Sitting-balance support: Bilateral upper extremity supported;Feet supported Sitting balance-Leahy Scale: Zero                                     ADL either performed or assessed with clinical judgement   ADL Overall ADL's : Needs assistance/impaired Eating/Feeding: Sitting;Moderate assistance Eating/Feeding Details (indicate cue type  and reason): hand over hand assist Grooming: Wash/dry face;Brushing hair;Moderate assistance;Maximal assistance Grooming Details (indicate cue type and reason): hand over hand assist                               General ADL Comments: pt currently requiring total assist for all ADL completion; encouraged pt to continue moving UEs within available range and to continue working on bringing RUE to mouth for self-feeding (pt currently able to demonstrate this motion); encouraged pt to actively participate in self-feeding task vs completing task dependently     Vision Baseline Vision/History: Wears glasses Wears Glasses: Reading only Patient Visual Report: No change from baseline     Perception     Praxis      Cognition Arousal/Alertness: Awake/alert Behavior During Therapy: WFL for tasks assessed/performed Overall Cognitive Status: No family/caregiver present to determine baseline cognitive functioning Area of Impairment: Orientation;Memory                 Orientation Level: Disoriented to;Place;Time             General Comments: pt not answering questions accuratley        Exercises Other Exercises Other Exercises: AA/A/PROM of B UEs in multilpe planes 1 set x 5 reps each Other Exercises: B shoulder flexion x 5 reps holding a 10-15 seconds Other Exercises: Cervical AROM and AAROM x 5 reps holding a 10-15 seconds  Shoulder Instructions       General Comments      Pertinent Vitals/ Pain       Pain Assessment: No/denies pain  Home Living                                          Prior Functioning/Environment              Frequency  Min 2X/week        Progress Toward Goals  OT Goals(current goals can now be found in the care plan section)  Progress towards OT goals: Progressing toward goals  Acute Rehab OT Goals Patient Stated Goal: to be able to use his hands  OT Goal Formulation: With patient  Plan Discharge  plan remains appropriate    Co-evaluation                 AM-PAC PT "6 Clicks" Daily Activity     Outcome Measure   Help from another person eating meals?: A Lot Help from another person taking care of personal grooming?: A Lot Help from another person toileting, which includes using toliet, bedpan, or urinal?: Total Help from another person bathing (including washing, rinsing, drying)?: Total Help from another person to put on and taking off regular upper body clothing?: Total Help from another person to put on and taking off regular lower body clothing?: Total 6 Click Score: 8    End of Session    OT Visit Diagnosis: Muscle weakness (generalized) (M62.81);Other symptoms and signs involving the nervous system (R29.898)   Activity Tolerance Patient tolerated treatment well   Patient Left in chair;with call bell/phone within reach   Nurse Communication      Functional Assessment Tool Used: AM-PAC 6 Clicks Daily Activity   Time: 1610-9604 OT Time Calculation (min): 25 min  Charges: OT G-codes **NOT FOR INPATIENT CLASS** Functional Assessment Tool Used: AM-PAC 6 Clicks Daily Activity OT Treatments $Self Care/Home Management : 8-22 mins $Therapeutic Exercise: 8-22 mins     Galen Manila 05/19/2017, 12:29 PM

## 2017-05-19 NOTE — Progress Notes (Signed)
Dr Maryfrances Bunnell inquired yesterday about a specialty [facility] for the patient at d/c. CM spoke to PT/OT and they will update CM on whether this is possible for Mr Loria and the process if he is eligible. CM following.

## 2017-05-19 NOTE — Progress Notes (Signed)
Events noted in the last few days.  Platelet count is 15,000 which is unchanged in the last 24 hours.  No active bleeding is noted.  I recommend continuous monitoring at this time without any further intervention.  His thrombocytopenia is likely medication related and anticipate recovery in the next 7 days.  For the meantime, I recommend transfusion only if active bleeding is noted or platelet count below 10,000.

## 2017-05-19 NOTE — Progress Notes (Signed)
Referrals faxed out to [facility] facilities that provide in house HD: [facility]: fax: [phone]),  [address] [place]: fax: [phone]), [facility]: fax: [phone]). CM left message for [facility] admission coordinator Lowanda Foster). CM spoke to Dickens at [employer] ([facility]) and she will not accept referral until patient has [facility]. CSW updated. CM following.

## 2017-05-20 DIAGNOSIS — F5101 Primary insomnia: Secondary | ICD-10-CM

## 2017-05-20 LAB — RENAL FUNCTION PANEL
Albumin: 1.8 g/dL — ABNORMAL LOW (ref 3.5–5.0)
Anion gap: 10 (ref 5–15)
BUN: 43 mg/dL — AB (ref 6–20)
CALCIUM: 6.8 mg/dL — AB (ref 8.9–10.3)
CO2: 24 mmol/L (ref 22–32)
CREATININE: 4.1 mg/dL — AB (ref 0.61–1.24)
Chloride: 107 mmol/L (ref 101–111)
GFR calc non Af Amer: 16 mL/min — ABNORMAL LOW (ref >60)
GFR, EST AFRICAN AMERICAN: 18 mL/min — AB (ref >60)
GLUCOSE: 96 mg/dL (ref 65–99)
Phosphorus: 3.5 mg/dL (ref 2.5–4.6)
Potassium: 3.8 mmol/L (ref 3.5–5.1)
SODIUM: 141 mmol/L (ref 135–145)

## 2017-05-20 LAB — CBC
HCT: 25.2 % — ABNORMAL LOW (ref 39.0–52.0)
Hemoglobin: 8.5 g/dL — ABNORMAL LOW (ref 13.0–17.0)
MCH: 28.7 pg (ref 26.0–34.0)
MCHC: 33.7 g/dL (ref 30.0–36.0)
MCV: 85.1 fL (ref 78.0–100.0)
PLATELETS: 12 10*3/uL — AB (ref 150–400)
RBC: 2.96 MIL/uL — AB (ref 4.22–5.81)
RDW: 15 % (ref 11.5–15.5)
WBC: 7.1 10*3/uL (ref 4.0–10.5)

## 2017-05-20 LAB — GLUCOSE, CAPILLARY
GLUCOSE-CAPILLARY: 94 mg/dL (ref 65–99)
GLUCOSE-CAPILLARY: 103 mg/dL — AB (ref 65–99)
Glucose-Capillary: 116 mg/dL — ABNORMAL HIGH (ref 65–99)
Glucose-Capillary: 88 mg/dL (ref 65–99)

## 2017-05-20 LAB — MRSA PCR SCREENING: MRSA by PCR: NEGATIVE

## 2017-05-20 NOTE — Progress Notes (Signed)
RT called and has arrived due to pt desatting into the high 70s to low 80's. Pt confused and unaware of current situation. CPAP is now in place and awaiting sats to normalize.

## 2017-05-20 NOTE — Progress Notes (Signed)
Patient ID: Dylan [name], male   DOB: Jan 11, 1966, 52 y.o.   MRN: [id number] [facility] Progress Note   Assessment/ Plan:    1. Acute kidney injury: Suspected secondary to nephrotoxic ATN from supratherapeutic vancomycin levels.He unfortunately remains anuric and without any renal recovery- HD today 4/16, next planned for today 4/18.  Getting irrigation of urethral catheter intermittently.  Unfortunately with his quadriplegia and high amount of assistance required, will need placement out of state if continues to need HD if he can't sit in the chair.  He is willing to try to sit but he won't be able to transfer and will likely need [facility] lift.    2. Atrial fibrillation: Currently rate controlled on diltiazem/metoprolol and amiodarone.    3. Candidemia: s/p andilafungin  4.  Cervical abscess versus hematoma: off dapto and cefepime.   5.  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  Urinary retention: Secondary to blockage of Foley catheter-underwent exchange by urology.  7.  Thrombocytopenia: plts down to 15, off dapto/cefepime/ andilafungin.   Protonix d/c'd in case that's contributing.  HIT neg 4/12.  Appreciate heme weighing in.  Subjective:    Will try for HD in chair today.  Objective:   BP 91/60 (BP Location: Right Arm)   Pulse 66   Temp 98 F (36.7 C) (Oral)   Resp (!) 22   Ht 6\' 4"  (1.93 m)   Wt 132.9 kg (293 lb)   SpO2 97%   BMI 35.67 kg/m   Intake/Output Summary (Last 24 hours) at 05/20/2017 1347 Last data filed at 05/20/2017 0532 Gross per 24 hour  Intake 120 ml  Output 650 ml  Net -530 ml   Weight change: 0.904 kg (1 lb 15.9 oz)   Physical Exam: Gen: Resting comfortably in bed, oxygen via nasal cannula CVS: Pulse regular rhythm, normal rate, S1 and S2 normal Resp: Anteriorly clear to auscultation without any distinct rales/rhonchi Abd: Soft, obese, nontender Ext: 1+ ankle edema MSK:  +sarcopenia ACCESS: R IJ NONTUNNELED HD catheter  Imaging: No results found.  Labs: BMET Recent Labs  Lab 05/14/17 0824 05/15/17 0739 05/16/17 0723 05/17/17 0414 05/18/17 0459 05/20/17 0449  NA 133* 133* 133* 135 138 141  K 4.3 3.4* 4.5 4.0 4.3 3.8  CL 99* 99* 98* 101 103 107  CO2 20* 19* 20* 21* 21* 24  GLUCOSE 181* 135* 110* 134* 149* 96  BUN 66* 43* 49* 58* 66* 43*  CREATININE 5.30* 3.96* 4.50* 5.04* 5.64* 4.10*  CALCIUM 7.2* 7.1* 7.0* 7.0* 6.7* 6.8*  PHOS 5.3* 4.3 5.0* 5.3* 5.9* 3.5   CBC Recent Labs  Lab 05/17/17 0414 05/18/17 0459 05/19/17 0907 05/20/17 0449  WBC 8.0 6.8 6.8 7.1  HGB 9.1* 8.7* 8.6* 8.5*  HCT 25.9* 25.2* 25.0* 25.2*  MCV 84.4 83.4 86.2 85.1  PLT 25* 15* 15* 12*    Medications:    . amiodarone  200 mg Oral Daily  . collagenase   Topical Daily  . dexamethasone  3 mg Oral Q8H  . diltiazem  60 mg Oral Q8H  . feeding supplement (ENSURE ENLIVE)  237 mL Oral BID BM  . guaiFENesin  600 mg Oral BID  . insulin aspart  0-5 Units Subcutaneous QHS  . insulin aspart  0-9 Units Subcutaneous TID WC  . insulin aspart  2 Units Subcutaneous TID WC  . insulin glargine  12 Units Subcutaneous Daily  . metoprolol tartrate  12.5 mg Oral BID  .  mirtazapine  30 mg Oral QHS   Bufford Buttner, MD [facility] pgr [phone] 05/20/2017, 1:47 PM

## 2017-05-20 NOTE — Progress Notes (Signed)
Orders just received to place pt back on Bipap as needed. RT called and en route. Will continue to monitor.

## 2017-05-20 NOTE — Progress Notes (Signed)
Laboratory results reviewed from today platelet counts slightly lower at 12,000.  No further recommendation at this time other than monitoring his CBC.  Other etiologies for his thrombocytopenia would be considered less likely at this time.

## 2017-05-20 NOTE — Plan of Care (Signed)
Pt reports minimal appetite. Pt is able to verbalize plan for discharge to SNF.

## 2017-05-20 NOTE — Progress Notes (Signed)
Occupational Therapy Treatment Patient Details Name: CASSEY HURRELL MRN: [id number] DOB: 08-03-1965 Today's Date: 05/20/2017    History of present illness Davell "[name]" Rekowski is a 52yo black male who comes to [facility] on 3/30 with ABD pain and hypoglycemia, admitted with cervical abcess v hematoma s/p C five-seven surgery on . 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 He DC to SNF ~3WA with motor weakness C7 down, full sensation intact. He reports graual worsening of manipulative use with smartphone over those three weaks.     OT comments  Pt seen for further assessment of seating/positioning needs. Transferred to dialysis chair using Maximove. Pt would benefit from a specialized motorized tilt in space wc with lateral trunk supports, head support, and elevating footrests with a specialized pressure reducing cushion. Discussed w/c positioning needs with wc rep Barbara Cower from [employer]) and he stated that Medicaid will only pay for a wc if the patient is going to an ALF or discharging home from a SNF. If a patient is a SNF resident, the snf will provide positioning for that pt at their discretion. Pt should be having his legs compression wrapped prior to dialysis to help with BP when sitting in the chair for long periods. He should also be using a pressure reducing cushion in the dialysis chair. Will contact rehab regarding the option of borrowing a cushion during this time.  Recommend B resting hand splints for night use and Prevalon boots to use while in the bed to improve positioning of B feet and reduce pressure from heels.   Follow Up Recommendations  SNF;Supervision/Assistance - 24 hour    Equipment Recommendations  Wheelchair (measurements OT);Wheelchair cushion (measurements OT)    Recommendations for Other Services      Precautions / Restrictions  Precautions Precautions: Fall;Cervical Precaution Comments: watch BP       Mobility Bed Mobility    General bed mobility comments: Used maximove to mobilize into dialysis chair  Transfers                 General transfer comment: Use of MAximove    Balance  Pt unableto self correct once up in chair. Note that pt's head begins to drop due to fatigue once upright for a period of time                               ADL either performed or assessed with clinical judgement   ADL Overall ADL's : Needs assistance/impaired                                         Vision       Perception     Praxis      Cognition Arousal/Alertness: Awake/alert Behavior During Therapy: WFL for tasks assessed/performed Overall Cognitive Status: Within Functional Limits for tasks assessed                                          Exercises   Shoulder Instructions       General Comments BLE compression wrapped with ace wraps prior to mobilizing into recliner to help maintain  BP. Once in chair, pillows placed laterally to give extra support for pt. Seating further assessed.     Pertinent Vitals/ Pain       Pain Assessment: Faces Faces Pain Scale: Hurts a little bit Pain Location: neck/general discomfort Pain Descriptors / Indicators: Discomfort;Grimacing Pain Intervention(s): Limited activity within patient's tolerance;Repositioned  Home Living                                          Prior Functioning/Environment              Frequency  Min 2X/week        Progress Toward Goals  OT Goals(current goals can now be found in the care plan section)  Progress towards OT goals: Progressing toward goals  Acute Rehab OT Goals Patient Stated Goal: to be able to use his hands  OT Goal Formulation: With patient Time For Goal Achievement: 05/27/17 Potential to Achieve Goals: Good ADL Goals Pt Will Perform  Eating: with mod assist;with adaptive utensils;sitting Pt Will Perform Grooming: with mod assist;sitting;with adaptive equipment Pt/caregiver will Perform Home Exercise Program: Increased ROM;Increased strength;Both right and left upper extremity;With minimal assist Additional ADL Goal #1: Pt will demonstrate ability to self-direct personal care during ADL/mobility completion.  Plan Discharge plan remains appropriate    Co-evaluation    PT/OT/SLP Co-Evaluation/Treatment: Yes Reason for Co-Treatment: For patient/therapist safety;Complexity of the patient's impairments (multi-system involvement);To address functional/ADL transfers   OT goals addressed during session: ADL's and self-care      AM-PAC PT "6 Clicks" Daily Activity     Outcome Measure   Help from another person eating meals?: Total Help from another person taking care of personal grooming?: A Lot Help from another person toileting, which includes using toliet, bedpan, or urinal?: Total Help from another person bathing (including washing, rinsing, drying)?: Total Help from another person to put on and taking off regular upper body clothing?: Total Help from another person to put on and taking off regular lower body clothing?: Total 6 Click Score: 7    End of Session Equipment Utilized During Treatment: Oxygen  OT Visit Diagnosis: Muscle weakness (generalized) (M62.81);Other symptoms and signs involving the nervous system (R29.898)   Activity Tolerance Patient tolerated treatment well   Patient Left in chair;Other (comment)(going to Dialysis)   Nurse Communication Mobility status;Need for lift equipment(Maximove)        Time: 1341-1405 OT Time Calculation (min): 24 min  Charges: OT General Charges $OT Visit: 1 Visit OT Treatments $Self Care/Home Management : 8-22 mins  [facility], OT/L  [phone] 05/20/2017   Jodi Criscuolo,HILLARY 05/20/2017, 3:56 PM

## 2017-05-20 NOTE — Progress Notes (Signed)
Called to patients room pt desating.  Patient been refusing to wear CPAP talked to patient with RN at bedside we got patient to wear CPAP SPO2 began to come up in the 90's.  RT to monitor

## 2017-05-20 NOTE — Progress Notes (Signed)
Physical Therapy Treatment Patient Details Name: Dylan [name] MRN: [id number] DOB: 1965/07/30 Today's Date: 05/20/2017    History of Present Illness Dylan [name] is a 52yo black male who comes to [facility] on 3/30 with ABD pain and hypoglycemia, admitted with cervical abcess v hematoma s/p C five-seven surgery on . 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 He DC to SNF ~3WA with motor weakness C7 down, full sensation intact. He reports graual worsening of manipulative use with smartphone over those three weaks.      PT Comments    Patient seen with OT and therapy tech. This session focused on ROM and sitting balance/tolerance EOB. Pt with drop in BP in sitting and with c/o dizziness. Pt demonstrated fatigue at neck and unable to consistently and hold up head for forward gaze. Pt does have more movement in R UE vs L UE. Continue to progress as tolerated with anticipated d/c to SNF for further skilled PT services.    Follow Up Recommendations  SNF;Supervision/Assistance - 24 hour     Equipment Recommendations  Other (comment)(PRAFO boots)    Recommendations for Other Services OT consult     Precautions / Restrictions Precautions Precautions: Fall;Cervical Precaution Comments: watch BP    Mobility  Bed Mobility Overal bed mobility: Needs Assistance Bed Mobility: Supine to Sit;Sit to Supine Rolling: Total assist;+2 for physical assistance Sidelying to sit: Total assist;+2 for physical assistance;HOB elevated Supine to sit: Total assist(+3) Sit to supine: Total assist(+3)   General bed mobility comments: Used maximove to mobilize into dialysis chair  Transfers                 General transfer comment: Use of MAximove  Ambulation/Gait                 Stairs             Wheelchair Mobility    Modified  Rankin (Stroke Patients Only)       Balance Overall balance assessment: Needs assistance Sitting-balance support: Feet supported;Bilateral upper extremity supported Sitting balance-Leahy Scale: Zero Sitting balance - Comments: unable to support self and BP soft in sitting from 147/98 supine to 118/82 in sitting; pt fatigues quickly at cervical spine  Postural control: Posterior lean                                  Cognition Arousal/Alertness: Awake/alert Behavior During Therapy: WFL for tasks assessed/performed Overall Cognitive Status: Within Functional Limits for tasks assessed                                        Exercises Other Exercises Other Exercises: PROM of bilat LE    General Comments General comments (skin integrity, edema, etc.): BLE compression wrapped with ace wraps prior to mobilizing into recliner to help maintain BP. Once in chair, pillows placed laterally to give extra support for pt. Seating further assessed.       Pertinent Vitals/Pain Pain Assessment: Faces Faces Pain Scale: Hurts a little bit Pain Location: neck/general discomfort Pain Descriptors / Indicators: Discomfort;Grimacing Pain Intervention(s): Limited activity within patient's tolerance;Monitored during session;Repositioned    Home Living  Prior Function            PT Goals (current goals can now be found in the care plan section) Acute Rehab PT Goals Patient Stated Goal: to be able to use his hands  Progress towards PT goals: Progressing toward goals    Frequency    Min 2X/week      PT Plan Current plan remains appropriate    Co-evaluation PT/OT/SLP Co-Evaluation/Treatment: Yes Reason for Co-Treatment: Complexity of the patient's impairments (multi-system involvement);Necessary to address cognition/behavior during functional activity;For patient/therapist safety;To address functional/ADL transfers PT goals  addressed during session: Mobility/safety with mobility;Balance;Strengthening/ROM OT goals addressed during session: ADL's and self-care      AM-PAC PT "6 Clicks" Daily Activity  Outcome Measure  Difficulty turning over in bed (including adjusting bedclothes, sheets and blankets)?: Unable Difficulty moving from lying on back to sitting on the side of the bed? : Unable Difficulty sitting down on and standing up from a chair with arms (e.g., wheelchair, bedside commode, etc,.)?: Unable Help needed moving to and from a bed to chair (including a wheelchair)?: Total Help needed walking in hospital room?: Total Help needed climbing 3-5 steps with a railing? : Total 6 Click Score: 6    End of Session   Activity Tolerance: Patient tolerated treatment well Patient left: in bed;with call bell/phone within reach;with SCD's reapplied Nurse Communication: Mobility status PT Visit Diagnosis: Other symptoms and signs involving the nervous system (R29.898);Difficulty in walking, not elsewhere classified (R26.2);Muscle weakness (generalized) (M62.81)     Time: 1914-7829 PT Time Calculation (min) (ACUTE ONLY): 25 min  Charges:  $Therapeutic Activity: 8-22 mins                    G Codes:       Erline Levine, PTA Pager: [phone]     Carolynne Edouard 05/20/2017, 4:32 PM

## 2017-05-20 NOTE — Progress Notes (Signed)
Called to room to place pt on BIPAP now have a PRN order, upon arrival pt is sating 97% and restign well on CPAP no distress noted spoke with charge RN advised to call me if anything changes.  Left V60 in room on stand by in case needed.

## 2017-05-20 NOTE — Progress Notes (Signed)
Nutrition Follow-up  DOCUMENTATION CODES:   Obesity unspecified  INTERVENTION:  Discussed with Dr. Catha Gosselin, liberalize diet to 2g NA  Continue Ensure Enlive po BID, each supplement provides 350 kcal and 20 grams of protein  NUTRITION DIAGNOSIS:   Inadequate oral intake related to poor appetite as evidenced by per patient/family report. -ongoing  GOAL:   Patient will meet greater than or equal to 90% of their needs -progressing  MONITOR:   PO intake, Supplement acceptance, Weight trends, I & O's, Labs  ASSESSMENT:   Pt with PMH of HTN, DM, MS, recent quadriplegia, s/p decompression spinal surgery on C5, C6 and C7 (03/25/17) developing multiple issues post op including new Afib and prevertebral hematoma at surgical sight. Pt presents with abdominal pain found AKI  RD consulted because patient wanted to discuss options with renal diet. Discussed with Dr. Catha Gosselin, noted K+ and Phos are WNL. Ok to [employer] and allow patient more food options. Ate 75% this morning but it was just oatmeal and apple juice. Appetite ok. 2L Fluid Negative Remains on iHD for AKI  Labs reviewed:  BUN/Creatinine 43/4.10  Medications reviewed and include:  Decadron, Insulin, Remeron  Diet Order:  Diet 2 gram sodium Room service appropriate? Yes; Fluid consistency: Thin; Fluid restriction: 1200 mL Fluid  EDUCATION NEEDS:   Not appropriate for education at this time  Skin:  Skin Assessment: Skin Integrity Issues: Skin Integrity Issues:: Unstageable Stage II: buttocks Unstageable: to buttocks  Last BM:  05/18/2017  Height:   Ht Readings from Last 1 Encounters:  05/05/17 6\' 4"  (1.93 m)    Weight:   Wt Readings from Last 1 Encounters:  05/20/17 293 lb (132.9 kg)    Ideal Body Weight:  91.8 kg  BMI:  Body mass index is 35.67 kg/m.  Estimated Nutritional Needs:   Kcal:  2200-2400  Protein:  120-135 grams  Fluid:  >/= 2.2 L/d  Dylan [name]. Dylan Kosta, MS, RD LDN Inpatient  Clinical Dietitian Pager [phone]

## 2017-05-20 NOTE — Progress Notes (Signed)
Occupational Therapy Treatment Patient Details Name: Dylan [name] MRN: [id number] DOB: Sep 02, 1965 Today's Date: 05/20/2017    History of present illness Ken "[name]" Humble is a 52yo black male who comes to [facility] on 3/30 with ABD pain and hypoglycemia, admitted with cervical abcess v hematoma s/p C five-seven surgery on . 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 He DC to SNF ~3WA with motor weakness C7 down, full sensation intact. He reports graual worsening of manipulative use with smartphone over those three weaks.     OT comments  Seen partial session with PT. Pt sat EOB with decrease in BP noted. No trunk control Unable to use BUE to help prop. Head/neck fatigues easily in seated position. Pt focused on hand to mouth pattern in preparation for self feeding. Pt able to touch mouth with R hand and drop shoulder to assist with extension. Pt with C6-7 patterns with RUE and C5-6 with L.UE. Pt wants to be able to feed himself. Will attempt use of u-cuff to help with self feeding next visit. Pt would also benefit from B resting hand splints to be worn at night. Also recommend B PRAFO boots to use while in bed to improve BLE positioning and reduce pressure from heels.   Follow Up Recommendations  SNF;Supervision/Assistance - 24 hour    Equipment Recommendations  Other (comment)    Recommendations for Other Services      Precautions / Restrictions Precautions Precautions: Fall;Cervical Precaution Comments: watch BP       Mobility Bed Mobility Overal bed mobility: Needs Assistance Bed Mobility: Supine to Sit;Sit to Supine Rolling: Total assist(+2) Sidelying to sit: Total assist Supine to sit: Total assist(+3) Sit to supine: Total assist(+3)      Transfers                      Balance Overall balance assessment: Needs  assistance Sitting-balance support: Feet supported;Bilateral upper extremity supported Sitting balance-Leahy Scale: Zero Sitting balance - Comments: unable to support self                                   ADL either performed or assessed with clinical judgement   ADL Overall ADL's : Needs assistance/impaired                                     Functional mobility during ADLs: Total assistance;+2 for physical assistance(+3 needed for bed mobility) General ADL Comments: total A for ADL. Pt worked on hand to mouth pattern. Feel pt may be able to use U-cuff with R elbow support.      Vision       Perception     Praxis      Cognition Arousal/Alertness: Awake/alert Behavior During Therapy: WFL for tasks assessed/performed Overall Cognitive Status: Within Functional Limits for tasks assessed                                          Exercises Other Exercises Other Exercises: AA/A/PROM of B UEs in multilpe planes 1 set x 10 reps each   Shoulder Instructions  General Comments      Pertinent Vitals/ Pain       Pain Assessment: Faces Faces Pain Scale: Hurts a little bit Pain Location: neck/general discomfort Pain Descriptors / Indicators: Discomfort;Grimacing Pain Intervention(s): Limited activity within patient's tolerance  Home Living                                          Prior Functioning/Environment              Frequency  Min 2X/week        Progress Toward Goals  OT Goals(current goals can now be found in the care plan section)  Progress towards OT goals: Progressing toward goals  Acute Rehab OT Goals Patient Stated Goal: to be able to use his hands  OT Goal Formulation: With patient Time For Goal Achievement: 05/27/17 Potential to Achieve Goals: Good ADL Goals Pt Will Perform Eating: with mod assist;with adaptive utensils;sitting Pt Will Perform Grooming: with mod  assist;sitting;with adaptive equipment Pt/caregiver will Perform Home Exercise Program: Increased ROM;Increased strength;Both right and left upper extremity;With minimal assist Additional ADL Goal #1: Pt will demonstrate ability to self-direct personal care during ADL/mobility completion.  Plan Discharge plan remains appropriate    Co-evaluation    PT/OT/SLP Co-Evaluation/Treatment: Yes Reason for Co-Treatment: Complexity of the patient's impairments (multi-system involvement);To address functional/ADL transfers;For patient/therapist safety   OT goals addressed during session: ADL's and self-care      AM-PAC PT "6 Clicks" Daily Activity     Outcome Measure   Help from another person eating meals?: A Lot Help from another person taking care of personal grooming?: A Lot Help from another person toileting, which includes using toliet, bedpan, or urinal?: Total Help from another person bathing (including washing, rinsing, drying)?: Total Help from another person to put on and taking off regular upper body clothing?: Total Help from another person to put on and taking off regular lower body clothing?: Total 6 Click Score: 8    End of Session Equipment Utilized During Treatment: Oxygen  OT Visit Diagnosis: Muscle weakness (generalized) (M62.81);Other symptoms and signs involving the nervous system (R29.898)   Activity Tolerance Patient tolerated treatment well   Patient Left in bed;with call bell/phone within reach;with restraints reapplied   Nurse Communication Mobility status        Time: 4098-1191 OT Time Calculation (min): 23 min  Charges: OT General Charges $OT Visit: 1 Visit OT Treatments $Self Care/Home Management : 8-22 mins  [facility], OT/L  [phone] 05/20/2017   Aneth Schlagel,HILLARY 05/20/2017, 3:47 PM

## 2017-05-20 NOTE — Progress Notes (Signed)
PROGRESS NOTE    Dylan [name]  GQQ:[id number] DOB: [birth year] DOA: 04/17/2017 PCP: Patient, No Pcp Per   Brief Narrative:  HPI on 04/13/2017 by Dr. Daiva Nakayama is a 52 y.o. male with medical history significant of recent quadriplegia, MS, decompression of spinal abscess admitted with abdominal pain and hypoglycemia.  He was diagnosed with MS and spinal stenosis .  He was admitted to neurosurgery 04/12/2017.  He had surgery at C6 C5 and C7.  Postoperative course was complicated with atrial fibrillation placed on Eliquis followed by which epidural clot versus abscess was seen on his C-spine MRI.  Because of the high risk patient decided not to repeat exploratory surgery.  He was started on IV antibiotics due to complicated UTI possible cervical abscess and was discharged to skilled nursing facility on 04/09/2017 patient was supposed to be on vancomycin and cefepime until May 06, 2017.  But the vancomycin was stopped earlier due to increasing creatinine. Patient was found to have gradual progressive swelling of his neck a CT scan at that time showed prevertebral hematoma. When patient came in today his Foley catheter was not draining.  Urology was consulted.  After seeing the Foley a small piece of tissue was removed which was occluding the catheter.  Then he put out 2.5 L.  ED physician also spoke to nephrologist who would be standby if needed as a consult.  Interim history Patient was readmitted for acute kidney injury with concerns that his Foley catheter was on training and possible vancomycin related renal injury.  Urology was consulted and flushed Foley at that time, removing foreign body yielding 2.5 L of urine output.  Patient was also found to have hyperkalemia given Kayexalate.  Infectious disease recommended continuation of daptomycin and cefepime which has now been completed.  Blood cultures grew C. [place]- PICC line was removed and patient started on anidulafungin.   Neurology was consulted and deferred MS treatment to the outpatient setting.  Nephrology consulted due to progressive oliguric renal failure, HD started. Found to have thrombocytopenia.   Assessment & Plan   Acute renal failure -Thought to be secondary to obstructive uropathy versus vancomycin-induced renal injury -Nephrology consulted and appreciated, patient has been dialyzing since 05/07/2017 -Nephrology ordering vein mapping -On 4/17, discussed with Dr. Hollie Salk, patient would likely need stretcher dialysis and this [place] have [place] done out of state. Pending results of platelets this morning. Patient currently only has RIJ access (nontunneled HD). Patient may need platelet transfusion for more permanent access to be placed.  -possible HD planned for today -Creatinine mildly improved, 4.10  Severe thrombocytopenia -Platelets 12 this morning -Thought to be drug related, Eraxis discontinued 5 days ago.  Daptomycin as well as cefepime are also stopped several days ago.  Nephrology discontinued Protonix. -HIT negative -Patient does not receive heparin with dialysis -Hematology consulted and appreciated-states resolution and recovery could take up to 7-10 days -Currently no clinical bleeding, though patient does have petechiae noted on physical exam -Continue to monitor CBC, transfuse if platelets drop below 10,000  Quadriplegia/cervical abscess versus cervical hematoma versus infected cervical hematoma -Patient completed 6 weeks of antibiotics including 4 weeks of vancomycin and cefepime followed by 2 weeks of daptomycin and cefepime, course completed on 05/16/2017  Neurogenic bladder -Patient was discharged with a Foley catheter on 04/09/2017 in the context of new quadriplegia -Urology consulted and appreciated, Foley catheter was flushed yielding 2.5 L -continue In/Out cath as needed  Candidemia -Patient had 10 days of anidulafungin (  discontinued due to thrombocytopenia) -Ophthalmology  consulted, found no evidence of intraocular fungal involvement -Echocardiogram showed no vegetation  Diabetes mellitus, type II with nephropathy -Continue Lantus, insulin sliding scale and CBG monitoring  Paroxysmal atrial fibrillation -CHADSVASC 1 -Given thrombocytopenia, anticoagulation contraindicated -Continue amiodarone, metoprolol, diltiazem   Multiple sclerosis -Recently diagnosed and was given 5 days of high-dose steroids with improvement (previous admission) -Neurology consulted and appreciated recommended outpatient follow-up  Adjustment disorder versus depression -Continue mirtazapine -Psychiatry consulted and appreciated, recommended trazodone   Hyponatremia -Resolved, Na 141 -continue to monitor BMP  Anemia secondary to chronic disease -Hemoglobin 8.5 -Continue to monitor CBC  Unstageable pressure injury to the bilateral buttocks -Present on admission -Continue wound care  Nocturnal hypoxia -No history of obstructive sleep apnea or the use of CPAP -Continue supplemental oxygen/CPAP at night  Goals of care -Palliative care consulted and appreciated-patient does have a living will in the chart  DVT Prophylaxis  SCDs  Code Status: Full  Family Communication: None at bedside  Disposition Plan: Admitted, pending improvement in thrombocytopenia as well as decision regarding dialysis.  Consultants Nephrology Neurology Psychiatry Infectious disease Ophthalmology Palliative care  Procedures  Echocardiogram Upper ext vein mapping  Antibiotics   Anti-infectives (From admission, onward)   Start     Dose/Rate Route Frequency Ordered Stop   05/08/17 2000  DAPTOmycin (CUBICIN) 900 mg in sodium chloride 0.9 % IVPB     900 mg 236 mL/hr over 30 Minutes Intravenous Every 48 hours 05/07/17 1114 05/16/17 2230   05/05/17 2000  anidulafungin (ERAXIS) 100 mg in sodium chloride 0.9 % 100 mL IVPB  Status:  Discontinued     100 mg 78 mL/hr over 100 Minutes  Intravenous Every 24 hours 05/04/17 1908 05/14/17 1751   05/04/17 2000  anidulafungin (ERAXIS) 200 mg in sodium chloride 0.9 % 200 mL IVPB     200 mg 78 mL/hr over 200 Minutes Intravenous  Once 05/04/17 1908 05/05/17 0000   05/02/17 1200  DAPTOmycin (CUBICIN) 1,000 mg in sodium chloride 0.9 % IVPB  Status:  Discontinued     1,000 mg 240 mL/hr over 30 Minutes Intravenous Every 48 hours 05/02/17 1143 05/07/17 1114   04/30/2017 1900  ceFEPIme (MAXIPIME) 1 g in sodium chloride 0.9 % 100 mL IVPB     1 g 200 mL/hr over 30 Minutes Intravenous Daily after supper 04/19/2017 1833 05/16/17 1850   04/02/2017 1800  ceFEPime (MAXIPIME) IVPB  Status:  Discontinued    Note to Pharmacy:  Indication:  Spinal abscess Last Day of Therapy:  05/16/2017 Labs - Once weekly:  CBC/D and BMP, Labs - Every other week:  ESR and CRP     2 g Intravenous Every 12 hours 04/06/2017 1757 05/02/2017 1833   04/24/2017 1245  vancomycin (VANCOCIN) 2,000 mg in sodium chloride 0.9 % 500 mL IVPB     2,000 mg 250 mL/hr over 120 Minutes Intravenous  Once 04/06/2017 1231 04/30/2017 1757   04/05/2017 1230  piperacillin-tazobactam (ZOSYN) IVPB 3.375 g     3.375 g 100 mL/hr over 30 Minutes Intravenous  Once 04/08/2017 1217 04/25/2017 1407   04/04/2017 1230  vancomycin (VANCOCIN) IVPB 1000 mg/200 mL premix  Status:  Discontinued     1,000 mg 200 mL/hr over 60 Minutes Intravenous  Once 04/03/2017 1217 04/23/2017 1231      Subjective:   Dylan [name] seen and examined today.  Has no complaints this morning. Denies pain, shortness breath, abdominal pain, nausea or vomiting, dizziness, headache.  Objective:  Vitals:   05/20/17 0344 05/20/17 0400 05/20/17 0426 05/20/17 0854  BP: 120/72   91/60  Pulse: 76   66  Resp: (!) 32  (!) 21 (!) 22  Temp: 98 F (36.7 C)     TempSrc: Oral   Oral  SpO2: (!) 87% 91% 92% 97%  Weight: 132.9 kg (293 lb)     Height:        Intake/Output Summary (Last 24 hours) at 05/20/2017 1056 Last data filed at 05/20/2017  0532 Gross per 24 hour  Intake 360 ml  Output 650 ml  Net -290 ml   Filed Weights   05/18/17 1245 05/19/17 0640 05/20/17 0344  Weight: 131 kg (288 lb 12.8 oz) 131.5 kg (290 lb) 132.9 kg (293 lb)   Exam  General: Well developed, chronically ill appearing, NAD  HEENT: NCAT, mucous membranes moist.   Neck: Supple  Cardiovascular: S1 S2 auscultated, RRR, no murmur  Respiratory: Clear to auscultation bilaterally with equal chest rise  Abdomen: Soft, obese, nontender, nondistended, + bowel sounds  Extremities: warm dry without cyanosis clubbing. No pitting edema  Neuro: AAOx3, quadriplegic  Psych: Given situation, appropriate mood and affect  Data Reviewed: I have personally reviewed following labs and imaging studies  CBC: Recent Labs  Lab 05/16/17 0723 05/17/17 0414 05/18/17 0459 05/19/17 0907 05/20/17 0449  WBC 6.4 8.0 6.8 6.8 7.1  HGB 8.9* 9.1* 8.7* 8.6* 8.5*  HCT 25.0* 25.9* 25.2* 25.0* 25.2*  MCV 82.8 84.4 83.4 86.2 85.1  PLT 38* 25* 15* 15* 12*   Basic Metabolic Panel: Recent Labs  Lab 05/15/17 0739 05/16/17 0723 05/17/17 0414 05/18/17 0459 05/20/17 0449  NA 133* 133* 135 138 141  K 3.4* 4.5 4.0 4.3 3.8  CL 99* 98* 101 103 107  CO2 19* 20* 21* 21* 24  GLUCOSE 135* 110* 134* 149* 96  BUN 43* 49* 58* 66* 43*  CREATININE 3.96* 4.50* 5.04* 5.64* 4.10*  CALCIUM 7.1* 7.0* 7.0* 6.7* 6.8*  PHOS 4.3 5.0* 5.3* 5.9* 3.5   GFR: Estimated Creatinine Clearance: 32.4 mL/min (A) (by C-G formula based on SCr of 4.1 mg/dL (H)). Liver Function Tests: Recent Labs  Lab 05/15/17 0739 05/16/17 0723 05/17/17 0414 05/18/17 0459 05/20/17 0449  ALBUMIN 2.0* 1.9* 1.9* 1.7* 1.8*   No results for input(s): LIPASE, AMYLASE in the last 168 hours. No results for input(s): AMMONIA in the last 168 hours. Coagulation Profile: No results for input(s): INR, PROTIME in the last 168 hours. Cardiac Enzymes: Recent Labs  Lab 05/14/17 0824  CKTOTAL 92   BNP (last 3  results) No results for input(s): PROBNP in the last 8760 hours. HbA1C: No results for input(s): HGBA1C in the last 72 hours. CBG: Recent Labs  Lab 05/19/17 0618 05/19/17 1112 05/19/17 1555 05/19/17 2112 05/20/17 0615  GLUCAP 136* 157* 120* 103* 94   Lipid Profile: No results for input(s): CHOL, HDL, LDLCALC, TRIG, CHOLHDL, LDLDIRECT in the last 72 hours. Thyroid Function Tests: No results for input(s): TSH, T4TOTAL, FREET4, T3FREE, THYROIDAB in the last 72 hours. Anemia Panel: No results for input(s): VITAMINB12, FOLATE, FERRITIN, TIBC, IRON, RETICCTPCT in the last 72 hours. Urine analysis:    Component Value Date/Time   COLORURINE YELLOW 04/25/2017 0920   APPEARANCEUR TURBID (A) 04/26/2017 0920   LABSPEC 1.015 04/11/2017 0920   PHURINE 6.0 04/19/2017 0920   GLUCOSEU NEGATIVE 04/11/2017 0920   HGBUR LARGE (A) 04/09/2017 0920   BILIRUBINUR NEGATIVE 04/04/2017 0920   KETONESUR NEGATIVE 04/07/2017 0920   PROTEINUR  30 (A) 04/08/2017 0920   NITRITE NEGATIVE 04/24/2017 0920   LEUKOCYTESUR MODERATE (A) 04/17/2017 0920   Sepsis Labs: @LABRCNTIP (procalcitonin:4,lacticidven:4)  )No results found for this or any previous visit (from the past 240 hour(s)).    Radiology Studies: No results found.   Scheduled Meds: . amiodarone  200 mg Oral Daily  . collagenase   Topical Daily  . dexamethasone  3 mg Oral Q8H  . diltiazem  60 mg Oral Q8H  . feeding supplement (ENSURE ENLIVE)  237 mL Oral BID BM  . guaiFENesin  600 mg Oral BID  . insulin aspart  0-5 Units Subcutaneous QHS  . insulin aspart  0-9 Units Subcutaneous TID WC  . insulin aspart  2 Units Subcutaneous TID WC  . insulin glargine  12 Units Subcutaneous Daily  . metoprolol tartrate  12.5 mg Oral BID  . mirtazapine  30 mg Oral QHS   Continuous Infusions:   LOS: 19 days   Time Spent in minutes   40 minutes  Allana Shrestha D.O. on 05/20/2017 at 10:56 AM  Between 7am to 7pm - Pager - [phone]  After 7pm go  to [email or web address] - password [email or web address]  And look for the night coverage person covering for me after hours  [facility] Office  [phone]

## 2017-05-20 NOTE — Progress Notes (Addendum)
Physical Therapy Treatment Patient Details Name: Dylan [name] MRN: [id number] DOB: February 25, 1965 Today's Date: 05/20/2017    History of Present Illness Dylan [name] is a 52yo black male who comes to [facility] on 3/30 with ABD pain and hypoglycemia, admitted with cervical abcess v hematoma s/p C five-seven surgery on . 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 He DC to SNF ~3WA with motor weakness C7 down, full sensation intact. He reports graual worsening of manipulative use with smartphone over those three weaks.      PT Comments    Patient seen second session for transfer to chair for dialysis and for positioning. Pt's bilat LE wrapped with ACE wraps for compression to aid in maintaining BP in sitting. Recommend PRAFO boots for optimal positioning of bilat LE and prevention of pressure wounds.  Pt with no c/o dizziness in sitting end of session. Current plan remains appropriate.   Follow Up Recommendations  SNF;Supervision/Assistance - 24 hour     Equipment Recommendations  Other (comment)(PRAFO boots)    Recommendations for Other Services OT consult     Precautions / Restrictions Precautions Precautions: Fall;Cervical Precaution Comments: watch BP    Mobility  Bed Mobility Overal bed mobility: Needs Assistance Bed Mobility: Supine to Sit;Sit to Supine Rolling: Total assist;+2 for physical assistance Sidelying to sit: Total assist;+2 for physical assistance;HOB elevated Supine to sit: Total assist(+3) Sit to supine: Total assist(+3)   General bed mobility comments: total A for all bed mobility  Transfers                 General transfer comment: pt's bilat LE wrapped with ACE bandages for compression prior to transfer to aid in maintaining BP in sitting; maximove utilized for transfer to dialysis  chair  Ambulation/Gait                 Stairs             Wheelchair Mobility    Modified Rankin (Stroke Patients Only)       Balance Overall balance assessment: Needs assistance Sitting-balance support: Feet supported;Bilateral upper extremity supported Sitting balance-Leahy Scale: Zero Sitting balance - Comments: unable to support self and BP soft in sitting from 147/98 supine to 118/82 in sitting; pt fatigues quickly at cervical spine  Postural control: Posterior lean                                  Cognition Arousal/Alertness: Awake/alert Behavior During Therapy: WFL for tasks assessed/performed Overall Cognitive Status: Within Functional Limits for tasks assessed                                        Exercises Other Exercises Other Exercises: PROM of bilat LE    General Comments General comments (skin integrity, edema, etc.): BLE compression wrapped with ace wraps prior to mobilizing into recliner to help maintain BP. Once in chair, pillows placed laterally to give extra support for pt. Seating further assessed.       Pertinent Vitals/Pain Pain Assessment: Faces Faces Pain Scale: Hurts a little bit Pain Location: neck/general discomfort Pain Descriptors / Indicators: Discomfort;Grimacing Pain Intervention(s): Limited activity within patient's tolerance;Monitored during session;Repositioned    Home  Living                      Prior Function            PT Goals (current goals can now be found in the care plan section) Acute Rehab PT Goals Patient Stated Goal: to be able to use his hands  Progress towards PT goals: Progressing toward goals    Frequency    Min 2X/week      PT Plan Current plan remains appropriate    Co-evaluation PT/OT/SLP Co-Evaluation/Treatment: Yes Reason for Co-Treatment: Complexity of the patient's impairments (multi-system involvement);Necessary to address  cognition/behavior during functional activity;For patient/therapist safety;To address functional/ADL transfers PT goals addressed during session: Mobility/safety with mobility;Balance;Strengthening/ROM OT goals addressed during session: ADL's and self-care      AM-PAC PT "6 Clicks" Daily Activity  Outcome Measure  Difficulty turning over in bed (including adjusting bedclothes, sheets and blankets)?: Unable Difficulty moving from lying on back to sitting on the side of the bed? : Unable Difficulty sitting down on and standing up from a chair with arms (e.g., wheelchair, bedside commode, etc,.)?: Unable Help needed moving to and from a bed to chair (including a wheelchair)?: Total Help needed walking in hospital room?: Total Help needed climbing 3-5 steps with a railing? : Total 6 Click Score: 6    End of Session   Activity Tolerance: Patient tolerated treatment well Patient left: in chair;Other (comment)(transport present to take pt to dialysis) Nurse Communication: Mobility status;Need for lift equipment PT Visit Diagnosis: Other symptoms and signs involving the nervous system (R29.898);Difficulty in walking, not elsewhere classified (R26.2);Muscle weakness (generalized) (M62.81)     Time: 1340-1405 PT Time Calculation (min) (ACUTE ONLY): 25 min  Charges:  $Therapeutic Activity: 8-22 mins                    G Codes:       Erline Levine, PTA Pager: [phone]     Carolynne Edouard 05/20/2017, 5:12 PM

## 2017-05-21 LAB — GLUCOSE, CAPILLARY
GLUCOSE-CAPILLARY: 82 mg/dL (ref 65–99)
Glucose-Capillary: 136 mg/dL — ABNORMAL HIGH (ref 65–99)
Glucose-Capillary: 150 mg/dL — ABNORMAL HIGH (ref 65–99)
Glucose-Capillary: 150 mg/dL — ABNORMAL HIGH (ref 65–99)

## 2017-05-21 LAB — CBC
HEMATOCRIT: 25.3 % — AB (ref 39.0–52.0)
Hemoglobin: 8.7 g/dL — ABNORMAL LOW (ref 13.0–17.0)
MCH: 29.5 pg (ref 26.0–34.0)
MCHC: 34.4 g/dL (ref 30.0–36.0)
MCV: 85.8 fL (ref 78.0–100.0)
PLATELETS: 11 10*3/uL — AB (ref 150–400)
RBC: 2.95 MIL/uL — AB (ref 4.22–5.81)
RDW: 15.5 % (ref 11.5–15.5)
WBC: 6.2 10*3/uL (ref 4.0–10.5)

## 2017-05-21 LAB — RENAL FUNCTION PANEL
ALBUMIN: 1.8 g/dL — AB (ref 3.5–5.0)
Anion gap: 11 (ref 5–15)
BUN: 24 mg/dL — AB (ref 6–20)
CHLORIDE: 104 mmol/L (ref 101–111)
CO2: 25 mmol/L (ref 22–32)
CREATININE: 2.76 mg/dL — AB (ref 0.61–1.24)
Calcium: 6.9 mg/dL — ABNORMAL LOW (ref 8.9–10.3)
GFR, EST AFRICAN AMERICAN: 29 mL/min — AB (ref >60)
GFR, EST NON AFRICAN AMERICAN: 25 mL/min — AB (ref >60)
Glucose, Bld: 87 mg/dL (ref 65–99)
PHOSPHORUS: 2.9 mg/dL (ref 2.5–4.6)
Potassium: 4 mmol/L (ref 3.5–5.1)
Sodium: 140 mmol/L (ref 135–145)

## 2017-05-21 NOTE — Progress Notes (Addendum)
Occupational Therapy Treatment Patient Details Name: Dylan [name] MRN: [id number] DOB: 1965-04-07 Today's Date: 05/21/2017    History of present illness Dylan [name] is a 52yo black male who comes to [facility] on 3/30 with ABD pain and hypoglycemia, admitted with cervical abcess v hematoma s/p C five-seven surgery on . 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 He DC to SNF ~3WA with motor weakness C7 down, full sensation intact. He reports graual worsening of manipulative use with smartphone over those three weaks.     OT comments  Focus of session of further assessing self feeding with use of U-cuff. Due to wrist weakness on R, pt has difficulty with successfully getting spoon to mouth. Will return with wrist hand orthotic with built in U-cuff to increase pt's ability to self feed.   Follow Up Recommendations  SNF;Supervision/Assistance - 24 hour    Equipment Recommendations  Wheelchair (measurements OT);Wheelchair cushion (measurements OT)    Recommendations for Other Services      Precautions / Restrictions Precautions Precautions: Fall;Cervical Precaution Comments: watch BP       Mobility Bed Mobility                  Transfers                      Balance                                           ADL either performed or assessed with clinical judgement   ADL Overall ADL's : Needs assistance/impaired Eating/Feeding: Moderate assistance;Bed level   Grooming: Moderate assistance Grooming Details (indicate cue type and reason): washing face after set up with min A                               General ADL Comments: Further assessed feeding. Pt using u-cuff although appears to have difficulty maintaining wrist extension when bringin food to mouth due to waekness; ableto  scoop onto spoon with correct positioning of bowl. Will try WHO to give pt more support when feeding     Vision       Perception     Praxis      Cognition Arousal/Alertness: Awake/alert Behavior During Therapy: WFL for tasks assessed/performed Overall Cognitive Status: Within Functional Limits for tasks assessed                                          Exercises Other Exercises Other Exercises: BUE A/AA/PROM within all planes Other Exercises: Pt completing hand to mouth pattern using u-cuff with R hand Other Exercises: Focus on bringing L hand to midline to use as stabilizer   Shoulder Instructions       General Comments Brought cusion for dialysis chair, however would not fit. Rehab does not curently have a cushion available. REcommend [employer] at this time.    Pertinent Vitals/ Pain       Pain Assessment: Faces Faces Pain Scale: No hurt  Home Living  Prior Functioning/Environment              Frequency  Min 2X/week        Progress Toward Goals  OT Goals(current goals can now be found in the care plan section)  Progress towards OT goals: Progressing toward goals  Acute Rehab OT Goals Patient Stated Goal: to feed himself OT Goal Formulation: With patient Time For Goal Achievement: 05/27/17 Potential to Achieve Goals: Good ADL Goals Pt Will Perform Eating: with mod assist;with adaptive utensils;sitting Pt Will Perform Grooming: with mod assist;sitting;with adaptive equipment Pt/caregiver will Perform Home Exercise Program: Increased ROM;Increased strength;Both right and left upper extremity;With minimal assist Additional ADL Goal #1: Pt will demonstrate ability to self-direct personal care during ADL/mobility completion.  Plan Discharge plan remains appropriate    Co-evaluation                 AM-PAC PT "6 Clicks" Daily Activity     Outcome Measure   Help  from another person eating meals?: A Lot Help from another person taking care of personal grooming?: A Lot Help from another person toileting, which includes using toliet, bedpan, or urinal?: Total Help from another person bathing (including washing, rinsing, drying)?: Total Help from another person to put on and taking off regular upper body clothing?: Total Help from another person to put on and taking off regular lower body clothing?: Total 6 Click Score: 8    End of Session Equipment Utilized During Treatment: Oxygen  OT Visit Diagnosis: Muscle weakness (generalized) (M62.81);Other symptoms and signs involving the nervous system (R29.898)   Activity Tolerance Patient tolerated treatment well   Patient Left in bed;with call bell/phone within reach;with SCD's reapplied   Nurse Communication Other (comment)(use fo AE)        Time: 4098-1191 OT Time Calculation (min): 30 min  Charges: OT General Charges $OT Visit: 1 Visit OT Treatments $Self Care/Home Management : 23-37 mins [facility], OT/L  [phone] 05/21/2017   Genecis Veley,HILLARY 05/21/2017, 1:07 PM

## 2017-05-21 NOTE — Progress Notes (Signed)
Occupational Therapy Treatment Patient Details Name: Dylan [name] MRN: [id number] DOB: 12-15-1965 Today's Date: 05/21/2017    History of present illness Kyllian "[name]" Webb is a 52yo black male who comes to [facility] on 3/30 with ABD pain and hypoglycemia, admitted with cervical abcess v hematoma s/p C five-seven surgery on . 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 He DC to SNF ~3WA with motor weakness C7 down, full sensation intact. He reports graual worsening of manipulative use with smartphone over those three weaks.     OT comments  Pt fit with B WHO with Ucuff. Pt with increased control using supports. Recommend Pt wear WHO during the day for 2 hr intervals and during functional tasks. Pt also issued bath mitt to use for grooming bathing. Spent increased time discussing use of U-cuff and AE to increase his independence with ADL tasks. Pt excited to use AE. Educated NT on use of wrist supports and wearing time. Will order B resting hand splints for night use only next week. Pt also needs B Prevalon boots and bariatric Geomat for chair (needs pressure relieving cushion)  - discussed with MD.    Follow Up Recommendations  SNF;Supervision/Assistance - 24 hour    Equipment Recommendations  Wheelchair (measurements OT);Wheelchair cushion (measurements OT)    Recommendations for Other Services      Precautions / Restrictions Precautions Precautions: Fall;Cervical Precaution Comments: watch BP                                                     ADL either performed or assessed with clinical judgement   ADL Overall ADL's : Needs assistance/impaired Eating/Feeding: Moderate assistance;Bed level   Grooming: Moderate assistance Grooming Details (indicate cue type and reason): issued bath mitt. Pt using with R hand to wash  face, neck and L arm                               General ADL Comments: Pt issued wrist hand orthotic (WHO) with U-cuff for BUE. Pt with increased control on spoon but has more difficulty loading spoon  - will assess with different positioning of plate/bowls; Began training with handle cup/lip by helping pt grasp using R hand and L hand to bump cup into hand and maintain control of cup. Pt able to bring strw to mout/dringk after set up. L WHO increases the ability to use L hand as a functional assist.      Vision       Perception     Praxis      Cognition Arousal/Alertness: Awake/alert Behavior During Therapy: WFL for tasks assessed/performed Overall Cognitive Status: Within Functional Limits for tasks assessed                                          Exercises Other Exercises Other Exercises: BUE A/AA/PROM within all planes Other Exercises: Pt completing hand to mouth pattern using u-cuff with R hand Other Exercises: Focus on bringing L hand to midline to use as stabilizer   Shoulder Instructions  General Comments Educated pt on need to have BLE ace wrapped prior to getting OOB to chair. Discussed need for cusion/pressure relief    Pertinent Vitals/ Pain       Pain Assessment: Faces Faces Pain Scale: No hurt Pain Location: neck/general discomfort Pain Descriptors / Indicators: Discomfort;Grimacing Pain Intervention(s): Limited activity within patient's tolerance  Home Living                                          Prior Functioning/Environment              Frequency  Min 2X/week        Progress Toward Goals  OT Goals(current goals can now be found in the care plan section)  Progress towards OT goals: Progressing toward goals  Acute Rehab OT Goals Patient Stated Goal: to feed himself OT Goal Formulation: With patient Time For Goal Achievement: 05/27/17 Potential to Achieve Goals: Good ADL Goals Pt  Will Perform Eating: with mod assist;with adaptive utensils;sitting Pt Will Perform Grooming: with mod assist;sitting;with adaptive equipment Pt/caregiver will Perform Home Exercise Program: Increased ROM;Increased strength;Both right and left upper extremity;With minimal assist Additional ADL Goal #1: Pt will demonstrate ability to self-direct personal care during ADL/mobility completion.  Plan Discharge plan remains appropriate    Co-evaluation                 AM-PAC PT "6 Clicks" Daily Activity     Outcome Measure   Help from another person eating meals?: A Lot Help from another person taking care of personal grooming?: A Lot Help from another person toileting, which includes using toliet, bedpan, or urinal?: Total Help from another person bathing (including washing, rinsing, drying)?: A Lot Help from another person to put on and taking off regular upper body clothing?: Total Help from another person to put on and taking off regular lower body clothing?: Total 6 Click Score: 9    End of Session Equipment Utilized During Treatment: Oxygen  OT Visit Diagnosis: Muscle weakness (generalized) (M62.81);Other symptoms and signs involving the nervous system (R29.898)   Activity Tolerance Patient tolerated treatment well   Patient Left in bed;with call bell/phone within reach;with SCD's reapplied   Nurse Communication Other (comment)        Time: 3086-5784 OT Time Calculation (min): 27 min  Charges: OT General Charges $OT Visit: 1 Visit OT Treatments $Self Care/Home Management : 23-37 mins  [facility], OT/L  [phone] 05/21/2017   Quaneisha Hanisch,HILLARY 05/21/2017, 1:19 PM

## 2017-05-21 NOTE — Progress Notes (Signed)
CM spoke to 3 of the [facility] facilities that patient agreed to be faxed out to for potential LTHD. Patient would need to have [facility] to be able to be accepted or live in [place] for 24 hours prior to starting the process. CSW updated.  CM also faxed his information to [facility] in [place]. CM following.

## 2017-05-21 NOTE — Progress Notes (Signed)
CSW continuing to follow for disposition needs. CM also supporting. CSW to follow up on facility referrals as appropriate pending medical readiness.  Abigail Butts, LCSWA [phone]

## 2017-05-21 NOTE — Progress Notes (Signed)
Patient ID: Dylan [name], male   DOB: 26-Jan-1966, 52 y.o.   MRN: [id number] [facility] Progress Note   Assessment/ Plan:    1. Acute kidney injury: Suspected secondary to nephrotoxic ATN from supratherapeutic vancomycin levels.He unfortunately remains anuric and without any renal recovery- HD today 4/16, next planned for today 4/20 Getting irrigation of urethral catheter intermittently.  Unfortunately with his quadriplegia and high amount of assistance required, will need placement out of state if continues to need HD if he can't sit in the chair.  He is willing to try to sit but he won't be able to transfer and will likely need [facility] lift.  We will do HD in the chair again tomorrow.  Vein mapped 4/17 in case perm access needs to be pursued.  2. Atrial fibrillation: Currently rate controlled on diltiazem/metoprolol and amiodarone.    3. Candidemia: s/p andilafungin  4.  Cervical abscess versus hematoma: off dapto and cefepime.   5.  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  Urinary retention: Secondary to blockage of Foley catheter-underwent exchange by urology.  7.  Thrombocytopenia: plts down to 12, off dapto/cefepime/ andilafungin (not known to cause thrombocytopenia).   Protonix d/c'd in case that's contributing.  HIT neg 4/12.  Appreciate heme weighing in.  Subjective:    Was able to come in the chair for HD yesterday.  Has arm braces and is encouraged that he's able to do more for himself  Objective:   BP 120/81 (BP Location: Right Arm)   Pulse 71   Temp 97.6 F (36.4 C) (Oral)   Resp 20   Ht 6\' 4"  (1.93 m)   Wt 126.6 kg (279 lb)   SpO2 100%   BMI 33.96 kg/m   Intake/Output Summary (Last 24 hours) at 05/21/2017 1530 Last data filed at 05/21/2017 1518 Gross per 24 hour  Intake -  Output 3100 ml  Net -3100 ml   Weight change: 1.096 kg (2 lb 6.7 oz)   Physical Exam: Gen: Resting comfortably in bed,  oxygen via nasal cannula CVS: Pulse regular rhythm, normal rate, S1 and S2 normal Resp: diminished bibasilar breath sounds Abd: Soft, obese, nontender Ext: 1+ ankle edema, ? A little abd edema MSK: +sarcopenia ACCESS: R IJ NONTUNNELED HD catheter  Imaging: No results found.  Labs: BMET Recent Labs  Lab 05/15/17 0739 05/16/17 0723 05/17/17 0414 05/18/17 0459 05/20/17 0449 05/21/17 0711  NA 133* 133* 135 138 141 140  K 3.4* 4.5 4.0 4.3 3.8 4.0  CL 99* 98* 101 103 107 104  CO2 19* 20* 21* 21* 24 25  GLUCOSE 135* 110* 134* 149* 96 87  BUN 43* 49* 58* 66* 43* 24*  CREATININE 3.96* 4.50* 5.04* 5.64* 4.10* 2.76*  CALCIUM 7.1* 7.0* 7.0* 6.7* 6.8* 6.9*  PHOS 4.3 5.0* 5.3* 5.9* 3.5 2.9   CBC Recent Labs  Lab 05/18/17 0459 05/19/17 0907 05/20/17 0449 05/21/17 0711  WBC 6.8 6.8 7.1 6.2  HGB 8.7* 8.6* 8.5* 8.7*  HCT 25.2* 25.0* 25.2* 25.3*  MCV 83.4 86.2 85.1 85.8  PLT 15* 15* 12* 11*    Medications:    . amiodarone  200 mg Oral Daily  . collagenase   Topical Daily  . dexamethasone  3 mg Oral Q8H  . diltiazem  60 mg Oral Q8H  . feeding supplement (ENSURE ENLIVE)  237 mL Oral BID BM  . guaiFENesin  600 mg Oral BID  . insulin aspart  0-5  Units Subcutaneous QHS  . insulin aspart  0-9 Units Subcutaneous TID WC  . insulin aspart  2 Units Subcutaneous TID WC  . insulin glargine  12 Units Subcutaneous Daily  . metoprolol tartrate  12.5 mg Oral BID  . mirtazapine  30 mg Oral QHS   Bufford Buttner, MD [facility] pgr [phone] 05/21/2017, 3:30 PM

## 2017-05-21 NOTE — Progress Notes (Signed)
PROGRESS NOTE    Dylan [name]  GYF:[id number] DOB: 01/19/1966 DOA: 04/17/2017 PCP: Patient, No Pcp Per   Brief Narrative:  HPI on 04/22/2017 by Dr. Daiva Nakayama is a 52 y.o. male with medical history significant of recent quadriplegia, MS, decompression of spinal abscess admitted with abdominal pain and hypoglycemia.  He was diagnosed with MS and spinal stenosis .  He was admitted to neurosurgery 04/12/2017.  He had surgery at C6 C5 and C7.  Postoperative course was complicated with atrial fibrillation placed on Eliquis followed by which epidural clot versus abscess was seen on his C-spine MRI.  Because of the high risk patient decided not to repeat exploratory surgery.  He was started on IV antibiotics due to complicated UTI possible cervical abscess and was discharged to skilled nursing facility on 04/09/2017 patient was supposed to be on vancomycin and cefepime until May 06, 2017.  But the vancomycin was stopped earlier due to increasing creatinine. Patient was found to have gradual progressive swelling of his neck a CT scan at that time showed prevertebral hematoma. When patient came in today his Foley catheter was not draining.  Urology was consulted.  After seeing the Foley a small piece of tissue was removed which was occluding the catheter.  Then he put out 2.5 L.  ED physician also spoke to nephrologist who would be standby if needed as a consult.  Interim history Patient was readmitted for acute kidney injury with concerns that his Foley catheter was on training and possible vancomycin related renal injury.  Urology was consulted and flushed Foley at that time, removing foreign body yielding 2.5 L of urine output.  Patient was also found to have hyperkalemia given Kayexalate.  Infectious disease recommended continuation of daptomycin and cefepime which has now been completed.  Blood cultures grew C. [place]- PICC line was removed and patient started on anidulafungin.   Neurology was consulted and deferred MS treatment to the outpatient setting.  Nephrology consulted due to progressive oliguric renal failure, HD started. Found to have thrombocytopenia.   Assessment & Plan   Acute renal failure -Thought to be secondary to obstructive uropathy versus vancomycin-induced renal injury -Nephrology consulted and appreciated, patient has been dialyzing since 05/07/2017 -Nephrology ordering vein mapping -On 4/17, discussed with Dr. Hollie Salk, patient would likely need stretcher dialysis and this would have to done out of state. Pending results of platelets this morning. Patient currently only has RIJ access (nontunneled HD). Patient may need platelet transfusion for more permanent access to be placed.  -Creatinine today 2.76 -patient dialyzed on 4/18 (will speak to nephrology to see if patient dialyzed in a chair)  Severe thrombocytopenia -Platelets 11 this morning -Thought to be drug related, Eraxis discontinued 5 days ago.  Daptomycin as well as cefepime are also stopped several days ago.  Nephrology discontinued Protonix. -HIT negative -Patient does not receive heparin with dialysis -Hematology consulted and appreciated-states resolution and recovery could take up to 7-10 days -Currently no clinical bleeding, though patient does have petechiae noted on physical exam -Continue to monitor CBC, transfuse if platelets drop below 10,000  Quadriplegia/cervical abscess versus cervical hematoma versus infected cervical hematoma -Patient completed 6 weeks of antibiotics including 4 weeks of vancomycin and cefepime followed by 2 weeks of daptomycin and cefepime, course completed on 05/16/2017  Neurogenic bladder -Patient was discharged with a Foley catheter on 04/09/2017 in the context of new quadriplegia -Urology consulted and appreciated, Foley catheter was flushed yielding 2.5 L -continue In/Out cath  as needed  Candidemia -Patient had 10 days of anidulafungin (discontinued  due to thrombocytopenia) -Ophthalmology consulted, found no evidence of intraocular fungal involvement -Echocardiogram showed no vegetation  Diabetes mellitus, type II with nephropathy -Continue Lantus, insulin sliding scale and CBG monitoring  Paroxysmal atrial fibrillation -CHADSVASC 1 -Given thrombocytopenia, anticoagulation contraindicated -Continue amiodarone, metoprolol, diltiazem   Multiple sclerosis -Recently diagnosed and was given 5 days of high-dose steroids with improvement (previous admission) -Neurology consulted and appreciated recommended outpatient follow-up  Adjustment disorder versus depression -Continue mirtazapine -Psychiatry consulted and appreciated, recommended trazodone   Hyponatremia -Resolved, Na 140 -continue to monitor BMP  Anemia secondary to chronic disease -Hemoglobin 8.7 -Continue to monitor CBC  Unstageable pressure injury to the bilateral buttocks -Present on admission -Continue wound care  Nocturnal hypoxia -No history of obstructive sleep apnea or the use of CPAP -Continue supplemental oxygen/CPAP at night  Goals of care -Palliative care consulted and appreciated-patient does have a living will in the chart  DVT Prophylaxis  SCDs  Code Status: Full  Family Communication: None at bedside  Disposition Plan: Admitted, pending improvement in thrombocytopenia as well as decision regarding dialysis. Dispo will be SNF when patient is medically stable  Consultants Nephrology Neurology Psychiatry Infectious disease Ophthalmology Palliative care  Procedures  Echocardiogram Upper ext vein mapping  Antibiotics   Anti-infectives (From admission, onward)   Start     Dose/Rate Route Frequency Ordered Stop   05/08/17 2000  DAPTOmycin (CUBICIN) 900 mg in sodium chloride 0.9 % IVPB     900 mg 236 mL/hr over 30 Minutes Intravenous Every 48 hours 05/07/17 1114 05/16/17 2230   05/05/17 2000  anidulafungin (ERAXIS) 100 mg in sodium  chloride 0.9 % 100 mL IVPB  Status:  Discontinued     100 mg 78 mL/hr over 100 Minutes Intravenous Every 24 hours 05/04/17 1908 05/14/17 1751   05/04/17 2000  anidulafungin (ERAXIS) 200 mg in sodium chloride 0.9 % 200 mL IVPB     200 mg 78 mL/hr over 200 Minutes Intravenous  Once 05/04/17 1908 05/05/17 0000   05/02/17 1200  DAPTOmycin (CUBICIN) 1,000 mg in sodium chloride 0.9 % IVPB  Status:  Discontinued     1,000 mg 240 mL/hr over 30 Minutes Intravenous Every 48 hours 05/02/17 1143 05/07/17 1114   04/10/2017 1900  ceFEPIme (MAXIPIME) 1 g in sodium chloride 0.9 % 100 mL IVPB     1 g 200 mL/hr over 30 Minutes Intravenous Daily after supper 04/15/2017 1833 05/16/17 1850   04/06/2017 1800  ceFEPime (MAXIPIME) IVPB  Status:  Discontinued    Note to Pharmacy:  Indication:  Spinal abscess Last Day of Therapy:  05/16/2017 Labs - Once weekly:  CBC/D and BMP, Labs - Every other week:  ESR and CRP     2 g Intravenous Every 12 hours 04/18/2017 1757 04/09/2017 1833   04/08/2017 1245  vancomycin (VANCOCIN) 2,000 mg in sodium chloride 0.9 % 500 mL IVPB     2,000 mg 250 mL/hr over 120 Minutes Intravenous  Once 04/24/2017 1231 04/30/2017 1757   04/14/2017 1230  piperacillin-tazobactam (ZOSYN) IVPB 3.375 g     3.375 g 100 mL/hr over 30 Minutes Intravenous  Once 04/04/2017 1217 04/11/2017 1407   04/27/2017 1230  vancomycin (VANCOCIN) IVPB 1000 mg/200 mL premix  Status:  Discontinued     1,000 mg 200 mL/hr over 60 Minutes Intravenous  Once 04/21/2017 1217 04/22/2017 1231      Subjective:   Dylan [name] seen and examined today.  Denies  current chest pain, shortness of breath, abdominal pain, nausea, vomiting, dizziness, headache.   Objective:   Vitals:   05/20/17 1935 05/20/17 2336 05/21/17 0411 05/21/17 0803  BP: (!) 130/93 (!) 160/98 137/81 132/85  Pulse: 74 75 67 72  Resp: (!) 22 (!) 21 20 20   Temp: 97.7 F (36.5 C) 97.7 F (36.5 C) (!) 97.4 F (36.3 C) (!) 97.5 F (36.4 C)  TempSrc: Oral Oral Oral Oral  SpO2:  100% 100% 99% 98%  Weight:   126.6 kg (279 lb)   Height:        Intake/Output Summary (Last 24 hours) at 05/21/2017 1030 Last data filed at 05/21/2017 0000 Gross per 24 hour  Intake -  Output 2550 ml  Net -2550 ml   Filed Weights   05/20/17 1420 05/20/17 1820 05/21/17 0411  Weight: 134 kg (295 lb 6.7 oz) 132 kg (291 lb 0.1 oz) 126.6 kg (279 lb)   Exam  General: Well developed, chronically ill appearing, no apparent distress  HEENT: NCAT, mucous membranes moist.   Neck: Supple  Cardiovascular: S1 S2 auscultated, RRR, no murmur  Respiratory: Clear to auscultation bilaterally with equal chest rise  Abdomen: Soft, obese, nontender, nondistended, + bowel sounds  Extremities: warm dry without cyanosis clubbing. +LE edema (ankle)  Neuro: AAOx3, nonfocal, no new changes  Psych: Depressed, however appropriate   Data Reviewed: I have personally reviewed following labs and imaging studies  CBC: Recent Labs  Lab 05/17/17 0414 05/18/17 0459 05/19/17 0907 05/20/17 0449 05/21/17 0711  WBC 8.0 6.8 6.8 7.1 6.2  HGB 9.1* 8.7* 8.6* 8.5* 8.7*  HCT 25.9* 25.2* 25.0* 25.2* 25.3*  MCV 84.4 83.4 86.2 85.1 85.8  PLT 25* 15* 15* 12* 11*   Basic Metabolic Panel: Recent Labs  Lab 05/16/17 0723 05/17/17 0414 05/18/17 0459 05/20/17 0449 05/21/17 0711  NA 133* 135 138 141 140  K 4.5 4.0 4.3 3.8 4.0  CL 98* 101 103 107 104  CO2 20* 21* 21* 24 25  GLUCOSE 110* 134* 149* 96 87  BUN 49* 58* 66* 43* 24*  CREATININE 4.50* 5.04* 5.64* 4.10* 2.76*  CALCIUM 7.0* 7.0* 6.7* 6.8* 6.9*  PHOS 5.0* 5.3* 5.9* 3.5 2.9   GFR: Estimated Creatinine Clearance: 47 mL/min (A) (by C-G formula based on SCr of 2.76 mg/dL (H)). Liver Function Tests: Recent Labs  Lab 05/16/17 0723 05/17/17 0414 05/18/17 0459 05/20/17 0449 05/21/17 0711  ALBUMIN 1.9* 1.9* 1.7* 1.8* 1.8*   No results for input(s): LIPASE, AMYLASE in the last 168 hours. No results for input(s): AMMONIA in the last 168  hours. Coagulation Profile: No results for input(s): INR, PROTIME in the last 168 hours. Cardiac Enzymes: No results for input(s): CKTOTAL, CKMB, CKMBINDEX, TROPONINI in the last 168 hours. BNP (last 3 results) No results for input(s): PROBNP in the last 8760 hours. HbA1C: No results for input(s): HGBA1C in the last 72 hours. CBG: Recent Labs  Lab 05/20/17 0615 05/20/17 1137 05/20/17 1854 05/20/17 2123 05/21/17 0619  GLUCAP 94 116* 103* 88 82   Lipid Profile: No results for input(s): CHOL, HDL, LDLCALC, TRIG, CHOLHDL, LDLDIRECT in the last 72 hours. Thyroid Function Tests: No results for input(s): TSH, T4TOTAL, FREET4, T3FREE, THYROIDAB in the last 72 hours. Anemia Panel: No results for input(s): VITAMINB12, FOLATE, FERRITIN, TIBC, IRON, RETICCTPCT in the last 72 hours. Urine analysis:    Component Value Date/Time   COLORURINE YELLOW 04/29/2017 0920   APPEARANCEUR TURBID (A) 04/11/2017 0920   LABSPEC 1.015 04/25/2017 0920  PHURINE 6.0 05/02/2017 0920   GLUCOSEU NEGATIVE 04/09/2017 0920   HGBUR LARGE (A) 04/06/2017 0920   BILIRUBINUR NEGATIVE 04/08/2017 0920   KETONESUR NEGATIVE 04/30/2017 0920   PROTEINUR 30 (A) 04/29/2017 0920   NITRITE NEGATIVE 04/30/2017 0920   LEUKOCYTESUR MODERATE (A) 04/18/2017 0920   Sepsis Labs: @LABRCNTIP (procalcitonin:4,lacticidven:4)  ) Recent Results (from the past 240 hour(s))  MRSA PCR Screening     Status: None   Collection Time: 05/20/17 11:31 AM  Result Value Ref Range Status   MRSA by PCR NEGATIVE NEGATIVE Final    Comment:        The GeneXpert MRSA Assay (FDA approved for NASAL specimens only), is one component of a comprehensive MRSA colonization surveillance program. It is not intended to diagnose MRSA infection nor to guide or monitor treatment for MRSA infections. Performed at [facility], [place] [address] [place], [place] [address]       Radiology Studies: No results found.   Scheduled Meds: .  amiodarone  200 mg Oral Daily  . collagenase   Topical Daily  . dexamethasone  3 mg Oral Q8H  . diltiazem  60 mg Oral Q8H  . feeding supplement (ENSURE ENLIVE)  237 mL Oral BID BM  . guaiFENesin  600 mg Oral BID  . insulin aspart  0-5 Units Subcutaneous QHS  . insulin aspart  0-9 Units Subcutaneous TID WC  . insulin aspart  2 Units Subcutaneous TID WC  . insulin glargine  12 Units Subcutaneous Daily  . metoprolol tartrate  12.5 mg Oral BID  . mirtazapine  30 mg Oral QHS   Continuous Infusions:   LOS: 20 days   Time Spent in minutes   35 minutes  Janann Boeve D.O. on 05/21/2017 at 10:30 AM  Between 7am to 7pm - Pager - [phone]  After 7pm go to [email or web address] - password [email or web address]  And look for the night coverage person covering for me after hours  [facility] Office  [phone]

## 2017-05-22 DIAGNOSIS — K921 Melena: Secondary | ICD-10-CM

## 2017-05-22 LAB — RENAL FUNCTION PANEL
ALBUMIN: 1.9 g/dL — AB (ref 3.5–5.0)
ALBUMIN: 1.7 g/dL — AB (ref 3.5–5.0)
ANION GAP: 10 (ref 5–15)
ANION GAP: 10 (ref 5–15)
BUN: 33 mg/dL — ABNORMAL HIGH (ref 6–20)
BUN: 37 mg/dL — ABNORMAL HIGH (ref 6–20)
CHLORIDE: 102 mmol/L (ref 101–111)
CO2: 25 mmol/L (ref 22–32)
CO2: 24 mmol/L (ref 22–32)
Calcium: 7 mg/dL — ABNORMAL LOW (ref 8.9–10.3)
Calcium: 6.8 mg/dL — ABNORMAL LOW (ref 8.9–10.3)
Chloride: 104 mmol/L (ref 101–111)
Creatinine, Ser: 3.34 mg/dL — ABNORMAL HIGH (ref 0.61–1.24)
Creatinine, Ser: 3.45 mg/dL — ABNORMAL HIGH (ref 0.61–1.24)
GFR calc Af Amer: 23 mL/min — ABNORMAL LOW (ref >60)
GFR calc non Af Amer: 20 mL/min — ABNORMAL LOW (ref >60)
GFR calc non Af Amer: 19 mL/min — ABNORMAL LOW (ref >60)
GFR, EST AFRICAN AMERICAN: 22 mL/min — AB (ref >60)
GLUCOSE: 162 mg/dL — AB (ref 65–99)
Glucose, Bld: 172 mg/dL — ABNORMAL HIGH (ref 65–99)
PHOSPHORUS: 3.8 mg/dL (ref 2.5–4.6)
PHOSPHORUS: 4.1 mg/dL (ref 2.5–4.6)
POTASSIUM: 4.5 mmol/L (ref 3.5–5.1)
Potassium: 4.3 mmol/L (ref 3.5–5.1)
Sodium: 137 mmol/L (ref 135–145)
Sodium: 138 mmol/L (ref 135–145)

## 2017-05-22 LAB — CBC WITH DIFFERENTIAL/PLATELET
BASOS ABS: 0 10*3/uL (ref 0.0–0.1)
BASOS PCT: 0 %
EOS ABS: 0 10*3/uL (ref 0.0–0.7)
Eosinophils Relative: 0 %
HCT: 19.2 % — ABNORMAL LOW (ref 39.0–52.0)
HEMOGLOBIN: 6.3 g/dL — AB (ref 13.0–17.0)
Lymphocytes Relative: 6 %
Lymphs Abs: 0.4 10*3/uL — ABNORMAL LOW (ref 0.7–4.0)
MCH: 29.2 pg (ref 26.0–34.0)
MCHC: 33.3 g/dL (ref 30.0–36.0)
MCV: 87.7 fL (ref 78.0–100.0)
Monocytes Absolute: 0.1 10*3/uL (ref 0.1–1.0)
Monocytes Relative: 2 %
NEUTROS ABS: 5.9 10*3/uL (ref 1.7–7.7)
NEUTROS PCT: 92 %
Platelets: 47 10*3/uL — ABNORMAL LOW (ref 150–400)
RBC: 2.19 MIL/uL — ABNORMAL LOW (ref 4.22–5.81)
RDW: 15.7 % — ABNORMAL HIGH (ref 11.5–15.5)
WBC: 6.4 10*3/uL (ref 4.0–10.5)

## 2017-05-22 LAB — GLUCOSE, CAPILLARY
GLUCOSE-CAPILLARY: 193 mg/dL — AB (ref 65–99)
GLUCOSE-CAPILLARY: 130 mg/dL — AB (ref 65–99)
Glucose-Capillary: 145 mg/dL — ABNORMAL HIGH (ref 65–99)
Glucose-Capillary: 151 mg/dL — ABNORMAL HIGH (ref 65–99)

## 2017-05-22 LAB — CBC
HEMATOCRIT: 25.7 % — AB (ref 39.0–52.0)
HEMOGLOBIN: 8.6 g/dL — AB (ref 13.0–17.0)
MCH: 28.9 pg (ref 26.0–34.0)
MCHC: 33.5 g/dL (ref 30.0–36.0)
MCV: 86.2 fL (ref 78.0–100.0)
Platelets: 14 10*3/uL — CL (ref 150–400)
RBC: 2.98 MIL/uL — ABNORMAL LOW (ref 4.22–5.81)
RDW: 15.3 % (ref 11.5–15.5)
WBC: 6 10*3/uL (ref 4.0–10.5)

## 2017-05-22 LAB — PROTIME-INR
INR: 1.21
PROTHROMBIN TIME: 15.2 s (ref 11.4–15.2)

## 2017-05-22 LAB — HEMOGLOBIN AND HEMATOCRIT, BLOOD
HCT: 22.7 % — ABNORMAL LOW (ref 39.0–52.0)
Hemoglobin: 7.7 g/dL — ABNORMAL LOW (ref 13.0–17.0)

## 2017-05-22 LAB — PREPARE RBC (CROSSMATCH)

## 2017-05-22 LAB — APTT: APTT: 29 s (ref 24–36)

## 2017-05-22 MED ORDER — SODIUM CHLORIDE 0.9 % IV SOLN
Freq: Once | INTRAVENOUS | Status: AC
  Administered 2017-05-23: via INTRAVENOUS

## 2017-05-22 MED ORDER — ACETAMINOPHEN 325 MG PO TABS
650.0000 mg | ORAL_TABLET | Freq: Once | ORAL | Status: AC
  Administered 2017-05-22: 650 mg via ORAL
  Filled 2017-05-22: qty 2

## 2017-05-22 MED ORDER — DIPHENHYDRAMINE HCL 25 MG PO CAPS
25.0000 mg | ORAL_CAPSULE | Freq: Once | ORAL | Status: AC
  Administered 2017-05-22: 25 mg via ORAL
  Filled 2017-05-22: qty 1

## 2017-05-22 MED ORDER — SODIUM CHLORIDE 0.9 % IV SOLN
Freq: Once | INTRAVENOUS | Status: AC
  Administered 2017-05-22: 12:00:00 via INTRAVENOUS

## 2017-05-22 NOTE — Progress Notes (Signed)
Patient ID: Jacqulynn Cadet, male   DOB: 1965-06-19, 52 y.o.   MRN: [id number] [facility] Progress Note   Assessment/ Plan:    1. Acute kidney injury: Suspected secondary to nephrotoxic ATN from supratherapeutic vancomycin levels.He unfortunately remains anuric and without any renal recovery- HD today 4/16, next planned for today 4/20 Getting irrigation of urethral catheter intermittently.  Unfortunately with his quadriplegia and high amount of assistance required, will need placement out of state if continues to need HD if he can't sit in the chair but he was able to for rx 4/18.  Vein mapped 4/17 in case perm access needs to be pursued. It looks like the rate of rise of his creatinine is declining between HD treatments- hopefully this means some recovery but this remains to be seen.  He doesn't have a lot of muscle mass so this could reflect sarcopenia as well.  2. Atrial fibrillation: Currently rate controlled on diltiazem/metoprolol and amiodarone.    3. Candidemia: s/p andilafungin  4.  Cervical abscess versus hematoma: off dapto and cefepime.   5.  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  Urinary retention: Secondary to blockage of Foley catheter-underwent exchange by urology.  7.  Thrombocytopenia: plts down to 12, off dapto/cefepime/ andilafungin (not known to cause thrombocytopenia).   Protonix d/c'd in case that's contributing.  HIT neg 4/12.  Appreciate heme weighing in.  8.  Possible GI bleed- ? Likely related to thrombocytopenia--> have ordered a unit of platelets.  Subjective:    Had a bloody BM today just before being transferred into the chair for hemodialysis.  We are going to defer HD today and work this up.  I've ordered 1 u plts  Objective:   BP 126/88 (BP Location: Right Arm)   Pulse (!) 58   Temp 98.1 F (36.7 C) (Oral)   Resp 18   Ht 6\' 4"  (1.93 m)   Wt 126.6 kg (279 lb)   SpO2 100%   BMI  33.96 kg/m   Intake/Output Summary (Last 24 hours) at 05/22/2017 0724 Last data filed at 05/22/2017 0505 Gross per 24 hour  Intake 10 ml  Output 895 ml  Net -885 ml   Weight change:    Physical Exam: Gen: Resting comfortably in recliner CVS: Pulse regular rhythm, normal rate, S1 and S2 normal Resp: diminished bibasilar breath sounds Abd: Soft, obese, nontender Ext: legs wrapped in Unna boots MSK: +sarcopenia  ACCESS: R IJ NONTUNNELED HD catheter  Imaging: No results found.  Labs: BMET Recent Labs  Lab 05/15/17 0739 05/16/17 0723 05/17/17 0414 05/18/17 0459 05/20/17 0449 05/21/17 0711 05/22/17 0528  NA 133* 133* 135 138 141 140 137  K 3.4* 4.5 4.0 4.3 3.8 4.0 4.5  CL 99* 98* 101 103 107 104 102  CO2 19* 20* 21* 21* 24 25 25   GLUCOSE 135* 110* 134* 149* 96 87 162*  BUN 43* 49* 58* 66* 43* 24* 33*  CREATININE 3.96* 4.50* 5.04* 5.64* 4.10* 2.76* 3.34*  CALCIUM 7.1* 7.0* 7.0* 6.7* 6.8* 6.9* 7.0*  PHOS 4.3 5.0* 5.3* 5.9* 3.5 2.9 3.8   CBC Recent Labs  Lab 05/19/17 0907 05/20/17 0449 05/21/17 0711 05/22/17 0528  WBC 6.8 7.1 6.2 6.0  HGB 8.6* 8.5* 8.7* 8.6*  HCT 25.0* 25.2* 25.3* 25.7*  MCV 86.2 85.1 85.8 86.2  PLT 15* 12* 11* PENDING    Medications:    . acetaminophen  650 mg Oral Once  . amiodarone  200 mg Oral Daily  . collagenase   Topical Daily  . dexamethasone  3 mg Oral Q8H  . diltiazem  60 mg Oral Q8H  . diphenhydrAMINE  25 mg Oral Once  . feeding supplement (ENSURE ENLIVE)  237 mL Oral BID BM  . guaiFENesin  600 mg Oral BID  . insulin aspart  0-5 Units Subcutaneous QHS  . insulin aspart  0-9 Units Subcutaneous TID WC  . insulin aspart  2 Units Subcutaneous TID WC  . insulin glargine  12 Units Subcutaneous Daily  . metoprolol tartrate  12.5 mg Oral BID  . mirtazapine  30 mg Oral QHS   Bufford Buttner, MD [facility] pgr [phone] 05/22/2017, 7:24 AM

## 2017-05-22 NOTE — Progress Notes (Signed)
IP PROGRESS NOTE  Subjective:   Dylan [name] reports no complaints this morning. He denied any abdominal pain or discomfort. He denied any cough or shortness of breath. He did have a bloody bowel movement which is his only site of bleeding at this time.He denied any epistaxis or hematemesis. He denied any new bruises or petechiae.  Objective:  Vital signs in last 24 hours: Temp:  [97.4 F (36.3 C)-98.1 F (36.7 C)] 98.1 F (36.7 C) (04/20 0420) Pulse Rate:  [52-72] 58 (04/20 0420) Resp:  [18-20] 18 (04/20 0420) BP: (126-147)/(83-97) 126/88 (04/20 0420) SpO2:  [99 %-100 %] 100 % (04/20 0420) Weight change:  Last BM Date: 05/18/17(Per patient)  Intake/Output from previous day: 04/19 0701 - 04/20 0700 In: 10 [I.V.:10] Out: 895 [Urine:895] General: Alert, awake without distress.  Head: Normocephalic atraumatic. Mouth: mucous membranes moist, pharynx normal without lesions Eyes: No scleral icterus.  Pupils are equal and round reactive to light. Resp: clear to auscultation bilaterally without rhonchi or wheezes or dullness to percussion. Cardio: regular rate and rhythm, S1, S2 normal, no murmur, click, rub or gallop GI: soft, non-tender; bowel sounds normal; no masses,  no organomegaly Musculoskeletal: No joint deformity or effusion. Neurological: No motor, sensory deficits.  Intact deep tendon reflexes. Skin: No rashes or lesions. His dialysis catheter site. Without any bruising or erythema. No bleeding noted.   Lab Results: Recent Labs    05/21/17 0711 05/22/17 0528  WBC 6.2 6.0  HGB 8.7* 8.6*  HCT 25.3* 25.7*  PLT 11* 14*    BMET Recent Labs    05/21/17 0711 05/22/17 0528  NA 140 137  K 4.0 4.5  CL 104 102  CO2 25 25  GLUCOSE 87 162*  BUN 24* 33*  CREATININE 2.76* 3.34*  CALCIUM 6.9* 7.0*    Studies/Results: No results found.  Medications: I have reviewed the patient's current medications.  Assessment/Plan:  52 year old with:  1. Thrombocytopenia: The  etiology likely related to medication. He has been on number of antibiotics that have been discontinued without any pallor recovery at this time. His platelet count today was 14,000 with improvement from 11,000 on the last 24 hours.  The differential diagnosis was reviewed again HIT has been ruled out and immune thrombocytopenia is the only other possibility. There is no platelet recovery noted in the next few days we will consider trial of steroids or IVIG.  For the time being, I agree with platelet transfusion today given his episode of GI bleeding. I recommended obtaining one hour post platelet transfusion to assess his response which can give a clue towardst he etiology of his platelets decline.  2. GI bleeding:supportive management for the primary team.Consider GI consult although no intervention will be done to assess his platelet count is improved.  We will continue to follow with you for further recommendations.    LOS: 21 days   Eli Hose 05/22/2017, 11:43 AM

## 2017-05-22 NOTE — Progress Notes (Signed)
Nurse in formed MD(Mikhail) of patients bloody stool and that there was a sample in restroom, patient platelet count low and platelets given, patient cleaned up and patients under staurated with blood and clots. MD notified

## 2017-05-22 NOTE — Progress Notes (Signed)
Labs reviewed from the last 2 days with platelet count remains low but may be slightly improving.  Platelet count today is 14,000 and does not require transfusion unless bleeding is noted.  The etiology of his thrombocytopenia is likely related to medication and the majority of the offending agents have been discontinued.  I anticipate recovery in the next few days.  Other etiologies for his thrombocytopenia are considered less likely but will be investigated if no recovery is noted and next week.

## 2017-05-22 NOTE — Progress Notes (Signed)
Patient refused CPAP, he has Charles Mix running at 4.5 litre's will continue to monitor.

## 2017-05-22 NOTE — Progress Notes (Signed)
Pt has refused cpap at this time.  RT will continue to monitor. 

## 2017-05-22 NOTE — Progress Notes (Signed)
PROGRESS NOTE    KAZUO DURNIL  JQB:[id number] DOB: Feb 21, 1965 DOA: 04/30/2017 PCP: Patient, No Pcp Per   Brief Narrative:  HPI on 04/10/2017 by Dr. Daiva Nakayama is a 52 y.o. male with medical history significant of recent quadriplegia, MS, decompression of spinal abscess admitted with abdominal pain and hypoglycemia.  He was diagnosed with MS and spinal stenosis .  He was admitted to neurosurgery 04/12/2017.  He had surgery at C6 C5 and C7.  Postoperative course was complicated with atrial fibrillation placed on Eliquis followed by which epidural clot versus abscess was seen on his C-spine MRI.  Because of the high risk patient decided not to repeat exploratory surgery.  He was started on IV antibiotics due to complicated UTI possible cervical abscess and was discharged to skilled nursing facility on 04/09/2017 patient was supposed to be on vancomycin and cefepime until May 06, 2017.  But the vancomycin was stopped earlier due to increasing creatinine. Patient was found to have gradual progressive swelling of his neck a CT scan at that time showed prevertebral hematoma. When patient came in today his Foley catheter was not draining.  Urology was consulted.  After seeing the Foley a small piece of tissue was removed which was occluding the catheter.  Then he put out 2.5 L.  ED physician also spoke to nephrologist who would be standby if needed as a consult.  Interim history Patient was readmitted for acute kidney injury with concerns that his Foley catheter was on training and possible vancomycin related renal injury.  Urology was consulted and flushed Foley at that time, removing foreign body yielding 2.5 L of urine output.  Patient was also found to have hyperkalemia given Kayexalate.  Infectious disease recommended continuation of daptomycin and cefepime which has now been completed.  Blood cultures grew C. Glabrata- PICC line was removed and patient started on anidulafungin.   Neurology was consulted and deferred MS treatment to the outpatient setting.  Nephrology consulted due to progressive oliguric renal failure, HD started. Found to have thrombocytopenia.   Assessment & Plan   Acute renal failure -Thought to be secondary to obstructive uropathy versus vancomycin-induced renal injury -Nephrology consulted and appreciated, patient has been dialyzing since 05/07/2017 -Nephrology ordering vein mapping -On 4/17, discussed with Dr. Hollie Salk, patient would likely need stretcher dialysis and this would have to done out of state. Pending results of platelets this morning. Patient currently only has RIJ access (nontunneled HD). Patient may need platelet transfusion for more permanent access to be placed.  -Creatinine today 2.76 -patient dialyzed on 4/18 (will speak to nephrology to see if patient dialyzed in a chair)  Severe thrombocytopenia -Platelets 14 this morning -Thought to be drug related, Eraxis discontinued 5 days ago.  Daptomycin as well as cefepime are also stopped several days ago.  Nephrology discontinued Protonix. -HIT negative -Patient does not receive heparin with dialysis -Hematology consulted and appreciated-states resolution and recovery could take up to 7-10 days -unfortunately, this morning patient passed bloody bowel movement and several clots -transfuse 1u platelets -will recheck platelet count 1hr after transfusion has been completed -continue to monitor CBC  GI bleed  -suspect lower, given color -will not start protonix at this time as it is likely a lower bleed and protonix may add to thrombocytopenia -will monitor H/H q8hr -Currently hemoglobin 8.6  Quadriplegia/cervical abscess versus cervical hematoma versus infected cervical hematoma -Patient completed 6 weeks of antibiotics including 4 weeks of vancomycin and cefepime followed by 2 weeks  of daptomycin and cefepime, course completed on 05/16/2017  Neurogenic bladder -Patient was  discharged with a Foley catheter on 04/09/2017 in the context of new quadriplegia -Urology consulted and appreciated, Foley catheter was flushed yielding 2.5 L -continue In/Out cath as needed  Candidemia -Patient had 10 days of anidulafungin (discontinued due to thrombocytopenia) -Ophthalmology consulted, found no evidence of intraocular fungal involvement -Echocardiogram showed no vegetation  Diabetes mellitus, type II with nephropathy -Continue Lantus, insulin sliding scale and CBG monitoring  Paroxysmal atrial fibrillation -CHADSVASC 1 -Given thrombocytopenia, anticoagulation contraindicated -Continue amiodarone, metoprolol, diltiazem   Multiple sclerosis -Recently diagnosed and was given 5 days of high-dose steroids with improvement (previous admission) -Neurology consulted and appreciated recommended outpatient follow-up  Adjustment disorder versus depression -Continue mirtazapine -Psychiatry consulted and appreciated, recommended trazodone   Hyponatremia -Resolved, Na 140 -continue to monitor BMP  Anemia secondary to chronic disease -Hemoglobin 8.6 -Continue to monitor CBC  Unstageable pressure injury to the bilateral buttocks -Present on admission -Continue wound care  Nocturnal hypoxia -No history of obstructive sleep apnea or the use of CPAP -Continue supplemental oxygen/CPAP at night  Goals of care -Palliative care consulted and appreciated-patient does have a living will in the chart  DVT Prophylaxis  SCDs  Code Status: Full  Family Communication: None at bedside  Disposition Plan: Admitted, pending improvement in thrombocytopenia as well as decision regarding dialysis. Dispo will be SNF when patient is medically stable  Consultants Nephrology Neurology Psychiatry Infectious disease Ophthalmology Palliative care  Procedures  Echocardiogram Upper ext vein mapping  Antibiotics   Anti-infectives (From admission, onward)   Start     Dose/Rate  Route Frequency Ordered Stop   05/08/17 2000  DAPTOmycin (CUBICIN) 900 mg in sodium chloride 0.9 % IVPB     900 mg 236 mL/hr over 30 Minutes Intravenous Every 48 hours 05/07/17 1114 05/16/17 2230   05/05/17 2000  anidulafungin (ERAXIS) 100 mg in sodium chloride 0.9 % 100 mL IVPB  Status:  Discontinued     100 mg 78 mL/hr over 100 Minutes Intravenous Every 24 hours 05/04/17 1908 05/14/17 1751   05/04/17 2000  anidulafungin (ERAXIS) 200 mg in sodium chloride 0.9 % 200 mL IVPB     200 mg 78 mL/hr over 200 Minutes Intravenous  Once 05/04/17 1908 05/05/17 0000   05/02/17 1200  DAPTOmycin (CUBICIN) 1,000 mg in sodium chloride 0.9 % IVPB  Status:  Discontinued     1,000 mg 240 mL/hr over 30 Minutes Intravenous Every 48 hours 05/02/17 1143 05/07/17 1114   04/25/2017 1900  ceFEPIme (MAXIPIME) 1 g in sodium chloride 0.9 % 100 mL IVPB     1 g 200 mL/hr over 30 Minutes Intravenous Daily after supper 04/29/2017 1833 05/16/17 1850   04/25/2017 1800  ceFEPime (MAXIPIME) IVPB  Status:  Discontinued    Note to Pharmacy:  Indication:  Spinal abscess Last Day of Therapy:  05/16/2017 Labs - Once weekly:  CBC/D and BMP, Labs - Every other week:  ESR and CRP     2 g Intravenous Every 12 hours 04/02/2017 1757 04/17/2017 1833   04/04/2017 1245  vancomycin (VANCOCIN) 2,000 mg in sodium chloride 0.9 % 500 mL IVPB     2,000 mg 250 mL/hr over 120 Minutes Intravenous  Once 04/19/2017 1231 04/18/2017 1757   04/10/2017 1230  piperacillin-tazobactam (ZOSYN) IVPB 3.375 g     3.375 g 100 mL/hr over 30 Minutes Intravenous  Once 04/23/2017 1217 04/18/2017 1407   04/20/2017 1230  vancomycin (VANCOCIN) IVPB 1000 mg/200  mL premix  Status:  Discontinued     1,000 mg 200 mL/hr over 60 Minutes Intravenous  Once 04/27/2017 1217 04/08/2017 1231      Subjective:   Salvador Boulet seen and examined today.  Denies current cramping, abdominal pain, nausea, vomiting, dizziness, headache, chest pain, shortness of breath. Had bloody bowel movement early this  morning, followed by passage of clots. .   Objective:   Vitals:   05/21/17 1603 05/21/17 2019 05/22/17 0007 05/22/17 0420  BP: 134/84 (!) 140/97 (!) 147/83 126/88  Pulse: 72 66 (!) 52 (!) 58  Resp: 20 18 18 18   Temp: (!) 97.5 F (36.4 C) 97.6 F (36.4 C) (!) 97.4 F (36.3 C) 98.1 F (36.7 C)  TempSrc: Oral Oral Oral Oral  SpO2: 99% 100% 100% 100%  Weight:      Height:        Intake/Output Summary (Last 24 hours) at 05/22/2017 1122 Last data filed at 05/22/2017 0505 Gross per 24 hour  Intake 10 ml  Output 895 ml  Net -885 ml   Filed Weights   05/20/17 1420 05/20/17 1820 05/21/17 0411  Weight: 134 kg (295 lb 6.7 oz) 132 kg (291 lb 0.1 oz) 126.6 kg (279 lb)   Exam  General: Well developed, chronically ill-appearing, no apparent distress  HEENT: NCAT, mucous membranes moist.   Neck: Supple  Cardiovascular: S1 S2 auscultated, RRR, no murmur  Respiratory: Clear to auscultation bilaterally with equal chest rise  Abdomen: Soft, obese, nontender, nondistended, + bowel sounds  Extremities: warm dry without cyanosis clubbing. LE edema  Neuro: AAOx3, no new changes  Psych: Depressed, however appropriate  Data Reviewed: I have personally reviewed following labs and imaging studies  CBC: Recent Labs  Lab 05/18/17 0459 05/19/17 0907 05/20/17 0449 05/21/17 0711 05/22/17 0528  WBC 6.8 6.8 7.1 6.2 6.0  HGB 8.7* 8.6* 8.5* 8.7* 8.6*  HCT 25.2* 25.0* 25.2* 25.3* 25.7*  MCV 83.4 86.2 85.1 85.8 86.2  PLT 15* 15* 12* 11* 14*   Basic Metabolic Panel: Recent Labs  Lab 05/17/17 0414 05/18/17 0459 05/20/17 0449 05/21/17 0711 05/22/17 0528  NA 135 138 141 140 137  K 4.0 4.3 3.8 4.0 4.5  CL 101 103 107 104 102  CO2 21* 21* 24 25 25   GLUCOSE 134* 149* 96 87 162*  BUN 58* 66* 43* 24* 33*  CREATININE 5.04* 5.64* 4.10* 2.76* 3.34*  CALCIUM 7.0* 6.7* 6.8* 6.9* 7.0*  PHOS 5.3* 5.9* 3.5 2.9 3.8   GFR: Estimated Creatinine Clearance: 38.9 mL/min (A) (by C-G formula based  on SCr of 3.34 mg/dL (H)). Liver Function Tests: Recent Labs  Lab 05/17/17 0414 05/18/17 0459 05/20/17 0449 05/21/17 0711 05/22/17 0528  ALBUMIN 1.9* 1.7* 1.8* 1.8* 1.9*   No results for input(s): LIPASE, AMYLASE in the last 168 hours. No results for input(s): AMMONIA in the last 168 hours. Coagulation Profile: Recent Labs  Lab 05/22/17 0846  INR 1.21   Cardiac Enzymes: No results for input(s): CKTOTAL, CKMB, CKMBINDEX, TROPONINI in the last 168 hours. BNP (last 3 results) No results for input(s): PROBNP in the last 8760 hours. HbA1C: No results for input(s): HGBA1C in the last 72 hours. CBG: Recent Labs  Lab 05/21/17 0619 05/21/17 1122 05/21/17 1555 05/21/17 2138 05/22/17 0605  GLUCAP 82 136* 150* 150* 145*   Lipid Profile: No results for input(s): CHOL, HDL, LDLCALC, TRIG, CHOLHDL, LDLDIRECT in the last 72 hours. Thyroid Function Tests: No results for input(s): TSH, T4TOTAL, FREET4, T3FREE, THYROIDAB in  the last 72 hours. Anemia Panel: No results for input(s): VITAMINB12, FOLATE, FERRITIN, TIBC, IRON, RETICCTPCT in the last 72 hours. Urine analysis:    Component Value Date/Time   COLORURINE YELLOW 04/16/2017 0920   APPEARANCEUR TURBID (A) 04/26/2017 0920   LABSPEC 1.015 04/21/2017 0920   PHURINE 6.0 04/09/2017 0920   GLUCOSEU NEGATIVE 05/02/2017 0920   HGBUR LARGE (A) 04/13/2017 0920   BILIRUBINUR NEGATIVE 04/17/2017 0920   KETONESUR NEGATIVE 04/16/2017 0920   PROTEINUR 30 (A) 04/05/2017 0920   NITRITE NEGATIVE 04/29/2017 0920   LEUKOCYTESUR MODERATE (A) 04/27/2017 0920   Sepsis Labs: @LABRCNTIP (procalcitonin:4,lacticidven:4)  ) Recent Results (from the past 240 hour(s))  MRSA PCR Screening     Status: None   Collection Time: 05/20/17 11:31 AM  Result Value Ref Range Status   MRSA by PCR NEGATIVE NEGATIVE Final    Comment:        The GeneXpert MRSA Assay (FDA approved for NASAL specimens only), is one component of a comprehensive MRSA  colonization surveillance program. It is not intended to diagnose MRSA infection nor to guide or monitor treatment for MRSA infections. Performed at [facility], [place] [address], [place], [place] [address]       Radiology Studies: No results found.   Scheduled Meds: . acetaminophen  650 mg Oral Once  . amiodarone  200 mg Oral Daily  . collagenase   Topical Daily  . dexamethasone  3 mg Oral Q8H  . diltiazem  60 mg Oral Q8H  . diphenhydrAMINE  25 mg Oral Once  . feeding supplement (ENSURE ENLIVE)  237 mL Oral BID BM  . guaiFENesin  600 mg Oral BID  . insulin aspart  0-5 Units Subcutaneous QHS  . insulin aspart  0-9 Units Subcutaneous TID WC  . insulin aspart  2 Units Subcutaneous TID WC  . insulin glargine  12 Units Subcutaneous Daily  . metoprolol tartrate  12.5 mg Oral BID  . mirtazapine  30 mg Oral QHS   Continuous Infusions: . sodium chloride       LOS: 21 days   Time Spent in minutes   45 minutes  Kionna Brier D.O. on 05/22/2017 at 11:22 AM  Between 7am to 7pm - Pager - [phone]  After 7pm go to [email or web address] - password [email or web address]  And look for the night coverage person covering for me after hours  [facility] Office  [phone]

## 2017-05-22 NOTE — Plan of Care (Signed)
  Problem: Clinical Measurements: Goal: Complications related to the disease process or treatment will be avoided or minimized Outcome: Not Progressing

## 2017-05-23 DIAGNOSIS — D500 Iron deficiency anemia secondary to blood loss (chronic): Secondary | ICD-10-CM

## 2017-05-23 LAB — GLUCOSE, CAPILLARY
GLUCOSE-CAPILLARY: 155 mg/dL — AB (ref 65–99)
GLUCOSE-CAPILLARY: 156 mg/dL — AB (ref 65–99)
GLUCOSE-CAPILLARY: 194 mg/dL — AB (ref 65–99)
Glucose-Capillary: 146 mg/dL — ABNORMAL HIGH (ref 65–99)
Glucose-Capillary: 131 mg/dL — ABNORMAL HIGH (ref 65–99)

## 2017-05-23 LAB — BPAM PLATELET PHERESIS
Blood Product Expiration Date: 201904210854
ISSUE DATE / TIME: 201904201233
Unit Type and Rh: 5100

## 2017-05-23 LAB — CBC
HCT: 25 % — ABNORMAL LOW (ref 39.0–52.0)
HEMATOCRIT: 27.1 % — AB (ref 39.0–52.0)
HEMOGLOBIN: 8.6 g/dL — AB (ref 13.0–17.0)
Hemoglobin: 9.2 g/dL — ABNORMAL LOW (ref 13.0–17.0)
MCH: 29.1 pg (ref 26.0–34.0)
MCH: 29.4 pg (ref 26.0–34.0)
MCHC: 33.9 g/dL (ref 30.0–36.0)
MCHC: 34.4 g/dL (ref 30.0–36.0)
MCV: 85.8 fL (ref 78.0–100.0)
MCV: 85.3 fL (ref 78.0–100.0)
Platelets: 36 10*3/uL — ABNORMAL LOW (ref 150–400)
Platelets: 61 10*3/uL — ABNORMAL LOW (ref 150–400)
RBC: 3.16 MIL/uL — AB (ref 4.22–5.81)
RBC: 2.93 MIL/uL — AB (ref 4.22–5.81)
RDW: 15 % (ref 11.5–15.5)
RDW: 15.5 % (ref 11.5–15.5)
WBC: 7.3 10*3/uL (ref 4.0–10.5)
WBC: 7.5 10*3/uL (ref 4.0–10.5)

## 2017-05-23 LAB — TROPONIN I: Troponin I: 0.04 ng/mL (ref <0.03)

## 2017-05-23 LAB — PREPARE PLATELET PHERESIS: Unit division: 0

## 2017-05-23 LAB — PREPARE RBC (CROSSMATCH)

## 2017-05-23 MED ORDER — SODIUM CHLORIDE 0.9 % IV SOLN
Freq: Once | INTRAVENOUS | Status: AC
  Administered 2017-05-23: 13:00:00 via INTRAVENOUS

## 2017-05-23 NOTE — Progress Notes (Signed)
Spoke with Dr.Mikhail and RBC are to be held due to results of H&H. Dr. Catha Gosselin ordered platelets to be administered, MD in formed patient lost IV access, IV team notified. Dr. Signe Colt form Nephrology also rounded and informed staff that lab can be drawn and blood can be administer form HD port (pigtail). Staff awaiting IV to administer platelets

## 2017-05-23 NOTE — Progress Notes (Signed)
Patients platelets ended at 17:09 and remaining will be returned to Blood Bank as soon as IV team disconnect tubing from HD port(pigtail). IV  consult place and IV team notified. Patient tolerated platelets well.

## 2017-05-23 NOTE — Progress Notes (Signed)
Pt refused cpap for the night, RT will continue to monitor as needed.

## 2017-05-23 NOTE — Progress Notes (Signed)
IP PROGRESS NOTE  Subjective:   Dylan [name] feels reasonably well this morning without any complaints.  He was passing bloody bowel movements yesterday afternoon but none this morning.  He received 1 unit of platelet transfusion with appropriate platelet response.  No active bleeding noted this morning.  He denies any hematochezia or hemoptysis.  He denies any bruises or petechiae.  Objective:  Vital signs in last 24 hours: Temp:  [96.2 F (35.7 C)-97.7 F (36.5 C)] 96.2 F (35.7 C) (04/21 0600) Pulse Rate:  [60-84] 64 (04/21 0600) Resp:  [16-20] 18 (04/21 0600) BP: (101-124)/(65-90) 121/80 (04/21 0600) SpO2:  [100 %] 100 % (04/21 0600) Weight:  [280 lb (127 kg)] 280 lb (127 kg) (04/20 2221) Weight change:  Last BM Date: 05/22/17  Intake/Output from previous day: 04/20 0701 - 04/21 0700 In: 1521 [P.O.:120; Blood:1401] Out: 425 [Urine:425] General: Comfortable appearing without distress. Head: Without abnormalities. Mouth: No mucosal bleeding noted. Eyes: Conjunctiva noninjected. Resp: Clear without any wheezes or rhonchi. Cardio: regular rate without any murmurs or gallops. GI: soft, nondistended without any rebound or guarding. Musculoskeletal: No clubbing or cyanosis. Neurological: No new focal deficits noted.  Palpable lesion noted. Skin: No petechiae noted His dialysis catheter site without any erythema, induration or bleeding.   Lab Results: Recent Labs    05/22/17 0528 05/22/17 1227 05/22/17 2105  WBC 6.0  --  6.4  HGB 8.6* 7.7* 6.3*  HCT 25.7* 22.7* 19.2*  PLT 14*  --  47*    BMET Recent Labs    05/22/17 0528 05/22/17 1227  NA 137 138  K 4.5 4.3  CL 102 104  CO2 25 24  GLUCOSE 162* 172*  BUN 33* 37*  CREATININE 3.34* 3.45*  CALCIUM 7.0* 6.8*    Studies/Results: No results found.  Medications: I have reviewed the patient's current medications.  Assessment/Plan:  52 year old with:  1. Thrombocytopenia related to medication versus  consumptive process.  He received platelet transfusion yesterday with appropriate response his platelet count up to 47,000.  His coags continues to be normal which rule out consumptive process such as DIC.  His fibrinogen level on 05/12/2017 was normal.  I recommend continue supportive care as you are doing.  If active bleeding is noted again, I would recommend keeping his platelet counts over 50,000.  I see no role for steroids or IVIG.  His platelet response to transfusion goes against immune thrombocytopenia.  2.  Hematochezia: Given his excellent response to platelet transfusion, I recommend GI consultation for possible evaluation for source of bleeding.  3.  Anemia: Related to acute blood loss from his GI bleed.  Would recommend packed red cell transfusion during his active GI bleed.  We will continue to follow with you and monitor his labs.    LOS: 22 days   Dylan [name] 05/23/2017, 8:27 AM

## 2017-05-23 NOTE — Progress Notes (Signed)
Lab called and notified staff that patient troponin level was 0.04, RN notified MD through amion.

## 2017-05-23 NOTE — Progress Notes (Signed)
IV blood tubing retrieved off other unit. Patient IV sit lost, RN attempted new acess, IV consult place to obtain a new IV site to administer blood product.

## 2017-05-23 NOTE — Progress Notes (Signed)
PROGRESS NOTE    Dylan [name]  CLE:[id number] DOB: March 08, 1965 DOA: 04/27/2017 PCP: Patient, No Pcp Per   Brief Narrative:  HPI on 04/03/2017 by Dr. Daiva Nakayama is a 52 y.o. male with medical history significant of recent quadriplegia, MS, decompression of spinal abscess admitted with abdominal pain and hypoglycemia.  He was diagnosed with MS and spinal stenosis .  He was admitted to neurosurgery 04/12/2017.  He had surgery at C6 C5 and C7.  Postoperative course was complicated with atrial fibrillation placed on Eliquis followed by which epidural clot versus abscess was seen on his C-spine MRI.  Because of the high risk patient decided not to repeat exploratory surgery.  He was started on IV antibiotics due to complicated UTI possible cervical abscess and was discharged to skilled nursing facility on 04/09/2017 patient was supposed to be on vancomycin and cefepime until May 06, 2017.  But the vancomycin was stopped earlier due to increasing creatinine. Patient was found to have gradual progressive swelling of his neck a CT scan at that time showed prevertebral hematoma. When patient came in today his Foley catheter was not draining.  Urology was consulted.  After seeing the Foley a small piece of tissue was removed which was occluding the catheter.  Then he put out 2.5 L.  ED physician also spoke to nephrologist who would be standby if needed as a consult.  Interim history Patient was readmitted for acute kidney injury with concerns that his Foley catheter was on training and possible vancomycin related renal injury.  Urology was consulted and flushed Foley at that time, removing foreign body yielding 2.5 L of urine output.  Patient was also found to have hyperkalemia given Kayexalate.  Infectious disease recommended continuation of daptomycin and cefepime which has now been completed.  Blood cultures grew C. Glabrata- PICC line was removed and patient started on anidulafungin.   Neurology was consulted and deferred MS treatment to the outpatient setting.  Nephrology consulted due to progressive oliguric renal failure, HD started. Found to have thrombocytopenia.   Assessment & Plan   Acute renal failure -Thought to be secondary to obstructive uropathy versus vancomycin-induced renal injury -Nephrology consulted and appreciated, patient has been dialyzing since 05/07/2017 -Nephrology ordering vein mapping -On 4/17, discussed with Dr. Hollie Salk, patient would likely need stretcher dialysis and this would have to done out of state. Pending results of platelets this morning. Patient currently only has RIJ access (nontunneled HD). Patient may need platelet transfusion for more permanent access to be placed.  -Creatinine today 2.76 -patient dialyzed on 4/18 (will speak to nephrology to see if patient dialyzed in a chair)  Severe thrombocytopenia -Thought to be drug related, Eraxis discontinued 5 days ago.  Daptomycin as well as cefepime are also stopped several days ago.  Nephrology discontinued Protonix. -HIT negative -Patient does not receive heparin with dialysis -Hematology consulted and appreciated-states resolution and recovery could take up to 7-10 days -Continues to pass blood clots, mildly improved per RN report -Platelets 36 this morning after platelet transfusion on 4/20 -Will transfuse additional unit of platelets, and monitor CBC -hematology feels that this is likely not immune mediated thrombocytopenia given patient's response to transfusion   GI bleed/Acute blood loss anemia/Acute on chronic anemia -will not start protonix at this time as it is likely a lower bleed and protonix may add to thrombocytopenia -hemoglobin dropped to 6.3 overnight, was transfused 2uPRBC -hemoglobin currently 9.2 -will discuss with GI for possible intervention, however given current  thrombocytopenia, doubt intervention will occur at this time -will continue to monitor  closely  Elevated troponin -not sure why troponin was ordered -troponin mildly elevated 0.04 -patient denies chest pain -suspect demand ischemia given anemia  Quadriplegia/cervical abscess versus cervical hematoma versus infected cervical hematoma -Patient completed 6 weeks of antibiotics including 4 weeks of vancomycin and cefepime followed by 2 weeks of daptomycin and cefepime, course completed on 05/16/2017  Neurogenic bladder -Patient was discharged with a Foley catheter on 04/09/2017 in the context of new quadriplegia -Urology consulted and appreciated, Foley catheter was flushed yielding 2.5 L -continue In/Out cath as needed  Candidemia -Patient had 10 days of anidulafungin (discontinued due to thrombocytopenia) -Ophthalmology consulted, found no evidence of intraocular fungal involvement -Echocardiogram showed no vegetation  Diabetes mellitus, type II with nephropathy -Continue Lantus, insulin sliding scale and CBG monitoring  Paroxysmal atrial fibrillation -CHADSVASC 1 -Given thrombocytopenia, anticoagulation contraindicated -Continue amiodarone, metoprolol, diltiazem   Multiple sclerosis -Recently diagnosed and was given 5 days of high-dose steroids with improvement (previous admission) -Neurology consulted and appreciated recommended outpatient follow-up  Adjustment disorder versus depression -Continue mirtazapine -Psychiatry consulted and appreciated, recommended trazodone   Hyponatremia -Resolved, Na 138 -continue to monitor BMP  Unstageable pressure injury to the bilateral buttocks -Present on admission -Continue wound care  Nocturnal hypoxia -No history of obstructive sleep apnea or the use of CPAP -Continue supplemental oxygen/CPAP at night  Goals of care -Palliative care consulted and appreciated-patient does have a living will in the chart  DVT Prophylaxis  SCDs  Code Status: Full  Family Communication: None at bedside  Disposition Plan:  Admitted, pending improvement in thrombocytopenia as well as decision regarding dialysis. Dispo will be SNF when patient is medically stable  Consultants Nephrology Neurology Psychiatry Infectious disease Ophthalmology Palliative care  Procedures  Echocardiogram Upper ext vein mapping  Antibiotics   Anti-infectives (From admission, onward)   Start     Dose/Rate Route Frequency Ordered Stop   05/08/17 2000  DAPTOmycin (CUBICIN) 900 mg in sodium chloride 0.9 % IVPB     900 mg 236 mL/hr over 30 Minutes Intravenous Every 48 hours 05/07/17 1114 05/16/17 2230   05/05/17 2000  anidulafungin (ERAXIS) 100 mg in sodium chloride 0.9 % 100 mL IVPB  Status:  Discontinued     100 mg 78 mL/hr over 100 Minutes Intravenous Every 24 hours 05/04/17 1908 05/14/17 1751   05/04/17 2000  anidulafungin (ERAXIS) 200 mg in sodium chloride 0.9 % 200 mL IVPB     200 mg 78 mL/hr over 200 Minutes Intravenous  Once 05/04/17 1908 05/05/17 0000   05/02/17 1200  DAPTOmycin (CUBICIN) 1,000 mg in sodium chloride 0.9 % IVPB  Status:  Discontinued     1,000 mg 240 mL/hr over 30 Minutes Intravenous Every 48 hours 05/02/17 1143 05/07/17 1114   04/30/2017 1900  ceFEPIme (MAXIPIME) 1 g in sodium chloride 0.9 % 100 mL IVPB     1 g 200 mL/hr over 30 Minutes Intravenous Daily after supper 04/25/2017 1833 05/16/17 1850   04/14/2017 1800  ceFEPime (MAXIPIME) IVPB  Status:  Discontinued    Note to Pharmacy:  Indication:  Spinal abscess Last Day of Therapy:  05/16/2017 Labs - Once weekly:  CBC/D and BMP, Labs - Every other week:  ESR and CRP     2 g Intravenous Every 12 hours 04/27/2017 1757 04/11/2017 1833   04/14/2017 1245  vancomycin (VANCOCIN) 2,000 mg in sodium chloride 0.9 % 500 mL IVPB     2,000 mg  250 mL/hr over 120 Minutes Intravenous  Once 04/14/2017 1231 04/13/2017 1757   05/02/2017 1230  piperacillin-tazobactam (ZOSYN) IVPB 3.375 g     3.375 g 100 mL/hr over 30 Minutes Intravenous  Once 04/27/2017 1217 04/28/2017 1407   04/29/2017  1230  vancomycin (VANCOCIN) IVPB 1000 mg/200 mL premix  Status:  Discontinued     1,000 mg 200 mL/hr over 60 Minutes Intravenous  Once 04/09/2017 1217 04/24/2017 1231      Subjective:   Dylan [name] seen and examined today.  Denies current chest pain, shortness of breath, abdominal pain, hematochezia, melena, nausea, vomiting, dizziness, headache.   Objective:   Vitals:   05/23/17 0521 05/23/17 0600 05/23/17 0830 05/23/17 1000  BP: 120/77 121/80 119/71 113/80  Pulse:  64 61 71  Resp:  18 19 18   Temp:  (!) 96.2 F (35.7 C) (!) 96 F (35.6 C)   TempSrc:  Oral Oral   SpO2:  100%    Weight:      Height:        Intake/Output Summary (Last 24 hours) at 05/23/2017 1155 Last data filed at 05/23/2017 0600 Gross per 24 hour  Intake 1521 ml  Output 425 ml  Net 1096 ml   Filed Weights   05/20/17 1820 05/21/17 0411 05/22/17 2221  Weight: 132 kg (291 lb 0.1 oz) 126.6 kg (279 lb) 127 kg (280 lb)   Exam  General: Well developed, chronically ill appearing, NAD  HEENT: NCAT, mucous membranes moist.   Neck: Supple  Cardiovascular: S1 S2 auscultated, RRR, no murmur  Respiratory: Clear to auscultation bilaterally with equal chest rise  Abdomen: Soft, nontender, nondistended, + bowel sounds  Extremities: warm dry without cyanosis clubbing. LE edema.  Neuro: AAOx3, no new changes  Psych: Depressed, however appropriate given current situation   Data Reviewed: I have personally reviewed following labs and imaging studies  CBC: Recent Labs  Lab 05/20/17 0449 05/21/17 0711 05/22/17 0528 05/22/17 1227 05/22/17 2105 05/23/17 0755  WBC 7.1 6.2 6.0  --  6.4 7.3  NEUTROABS  --   --   --   --  5.9  --   HGB 8.5* 8.7* 8.6* 7.7* 6.3* 9.2*  HCT 25.2* 25.3* 25.7* 22.7* 19.2* 27.1*  MCV 85.1 85.8 86.2  --  87.7 85.8  PLT 12* 11* 14*  --  47* 36*   Basic Metabolic Panel: Recent Labs  Lab 05/18/17 0459 05/20/17 0449 05/21/17 0711 05/22/17 0528 05/22/17 1227  NA 138 141 140 137  138  K 4.3 3.8 4.0 4.5 4.3  CL 103 107 104 102 104  CO2 21* 24 25 25 24   GLUCOSE 149* 96 87 162* 172*  BUN 66* 43* 24* 33* 37*  CREATININE 5.64* 4.10* 2.76* 3.34* 3.45*  CALCIUM 6.7* 6.8* 6.9* 7.0* 6.8*  PHOS 5.9* 3.5 2.9 3.8 4.1   GFR: Estimated Creatinine Clearance: 37.7 mL/min (A) (by C-G formula based on SCr of 3.45 mg/dL (H)). Liver Function Tests: Recent Labs  Lab 05/18/17 0459 05/20/17 0449 05/21/17 0711 05/22/17 0528 05/22/17 1227  ALBUMIN 1.7* 1.8* 1.8* 1.9* 1.7*   No results for input(s): LIPASE, AMYLASE in the last 168 hours. No results for input(s): AMMONIA in the last 168 hours. Coagulation Profile: Recent Labs  Lab 05/22/17 0846  INR 1.21   Cardiac Enzymes: Recent Labs  Lab 05/22/17 2117  TROPONINI 0.04*   BNP (last 3 results) No results for input(s): PROBNP in the last 8760 hours. HbA1C: No results for input(s): HGBA1C in the  last 72 hours. CBG: Recent Labs  Lab 05/22/17 1559 05/22/17 2224 05/23/17 0526 05/23/17 0825 05/23/17 1106  GLUCAP 193* 130* 155* 156* 146*   Lipid Profile: No results for input(s): CHOL, HDL, LDLCALC, TRIG, CHOLHDL, LDLDIRECT in the last 72 hours. Thyroid Function Tests: No results for input(s): TSH, T4TOTAL, FREET4, T3FREE, THYROIDAB in the last 72 hours. Anemia Panel: No results for input(s): VITAMINB12, FOLATE, FERRITIN, TIBC, IRON, RETICCTPCT in the last 72 hours. Urine analysis:    Component Value Date/Time   COLORURINE YELLOW 04/12/2017 0920   APPEARANCEUR TURBID (A) 04/13/2017 0920   LABSPEC 1.015 04/08/2017 0920   PHURINE 6.0 04/17/2017 0920   GLUCOSEU NEGATIVE 04/15/2017 0920   HGBUR LARGE (A) 04/22/2017 0920   BILIRUBINUR NEGATIVE 04/30/2017 0920   KETONESUR NEGATIVE 04/14/2017 0920   PROTEINUR 30 (A) 04/30/2017 0920   NITRITE NEGATIVE 04/07/2017 0920   LEUKOCYTESUR MODERATE (A) 04/16/2017 0920   Sepsis Labs: @LABRCNTIP (procalcitonin:4,lacticidven:4)  ) Recent Results (from the past 240 hour(s))   MRSA PCR Screening     Status: None   Collection Time: 05/20/17 11:31 AM  Result Value Ref Range Status   MRSA by PCR NEGATIVE NEGATIVE Final    Comment:        The GeneXpert MRSA Assay (FDA approved for NASAL specimens only), is one component of a comprehensive MRSA colonization surveillance program. It is not intended to diagnose MRSA infection nor to guide or monitor treatment for MRSA infections. Performed at [facility], [place] [address] [place], [place] [address]       Radiology Studies: No results found.   Scheduled Meds: . amiodarone  200 mg Oral Daily  . collagenase   Topical Daily  . dexamethasone  3 mg Oral Q8H  . diltiazem  60 mg Oral Q8H  . feeding supplement (ENSURE ENLIVE)  237 mL Oral BID BM  . guaiFENesin  600 mg Oral BID  . insulin aspart  0-5 Units Subcutaneous QHS  . insulin aspart  0-9 Units Subcutaneous TID WC  . insulin aspart  2 Units Subcutaneous TID WC  . insulin glargine  12 Units Subcutaneous Daily  . metoprolol tartrate  12.5 mg Oral BID  . mirtazapine  30 mg Oral QHS   Continuous Infusions: . sodium chloride       LOS: 22 days   Time Spent in minutes   45 minutes  Scott Vanderveer D.O. on 05/23/2017 at 11:55 AM  Between 7am to 7pm - Pager - [phone]  After 7pm go to [email or web address] - password [email or web address]  And look for the night coverage person covering for me after hours  [facility] Office  [phone]

## 2017-05-23 NOTE — Progress Notes (Signed)
Patient ID: Dylan [name], male   DOB: 21-Feb-1965, 52 y.o.   MRN: [id number] [facility] Progress Note   Assessment/ Plan:    1. Acute kidney injury: Suspected secondary to nephrotoxic ATN from supratherapeutic vancomycin levels.  Getting irrigation of urethral catheter intermittently.  Unfortunately with his quadriplegia and high amount of assistance required, will need placement out of state if continues to need HD if he can't sit in the chair but he was able to for rx 4/18.  Vein mapped 4/17 in case perm access needs to be pursued. It looks like the rate of rise of his creatinine is declining between HD treatments- hopefully this means some recovery but this remains to be seen.  He doesn't have a lot of muscle mass so this could reflect sarcopenia as well.  HD deferred 4/20 due to acute GI bleed.  Will plan for 4/22 but may need to be deferred again.  2. Atrial fibrillation: Currently rate controlled on diltiazem/metoprolol and amiodarone.    3. Candidemia: s/p andilafungin  4.  Cervical abscess versus hematoma: off dapto and cefepime.   5.  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  Urinary retention: Secondary to blockage of Foley catheter-underwent exchange by urology.  7.  Thrombocytopenia: plt transfusions as needed to keep plts > 50 at least  8.  GI bleed- getting blood and platelets, needs GI consult when counts are appropriate and he could potentially be scoped.  Subjective:    Still  Having BRBPR and passing clots  Objective:   BP 113/80 (BP Location: Left Arm)   Pulse 71   Temp (!) 96 F (35.6 C) (Oral)   Resp 18   Ht 6\' 4"  (1.93 m)   Wt 127 kg (280 lb)   SpO2 100%   BMI 34.08 kg/m   Intake/Output Summary (Last 24 hours) at 05/23/2017 1244 Last data filed at 05/23/2017 0600 Gross per 24 hour  Intake 1521 ml  Output 425 ml  Net 1096 ml   Weight change:    Physical Exam: Gen: Resting  comfortably in recliner CVS: Pulse regular rhythm, normal rate, S1 and S2 normal Resp: diminished bibasilar breath sounds Abd: Soft, obese, nontender, some blood in bed Ext: legs wrapped in Unna boots MSK: +sarcopenia  ACCESS: R IJ NONTUNNELED HD catheter Skin: some petechiae  Imaging: No results found.  Labs: BMET Recent Labs  Lab 05/17/17 0414 05/18/17 0459 05/20/17 0449 05/21/17 0711 05/22/17 0528 05/22/17 1227  NA 135 138 141 140 137 138  K 4.0 4.3 3.8 4.0 4.5 4.3  CL 101 103 107 104 102 104  CO2 21* 21* 24 25 25 24   GLUCOSE 134* 149* 96 87 162* 172*  BUN 58* 66* 43* 24* 33* 37*  CREATININE 5.04* 5.64* 4.10* 2.76* 3.34* 3.45*  CALCIUM 7.0* 6.7* 6.8* 6.9* 7.0* 6.8*  PHOS 5.3* 5.9* 3.5 2.9 3.8 4.1   CBC Recent Labs  Lab 05/21/17 0711 05/22/17 0528 05/22/17 1227 05/22/17 2105 05/23/17 0755  WBC 6.2 6.0  --  6.4 7.3  NEUTROABS  --   --   --  5.9  --   HGB 8.7* 8.6* 7.7* 6.3* 9.2*  HCT 25.3* 25.7* 22.7* 19.2* 27.1*  MCV 85.8 86.2  --  87.7 85.8  PLT 11* 14*  --  47* 36*    Medications:    . amiodarone  200 mg Oral Daily  . collagenase   Topical Daily  . dexamethasone  3  mg Oral Q8H  . diltiazem  60 mg Oral Q8H  . feeding supplement (ENSURE ENLIVE)  237 mL Oral BID BM  . guaiFENesin  600 mg Oral BID  . insulin aspart  0-5 Units Subcutaneous QHS  . insulin aspart  0-9 Units Subcutaneous TID WC  . insulin aspart  2 Units Subcutaneous TID WC  . insulin glargine  12 Units Subcutaneous Daily  . metoprolol tartrate  12.5 mg Oral BID  . mirtazapine  30 mg Oral QHS   Bufford Buttner, MD [facility] pgr [phone] 05/23/2017, 12:44 PM

## 2017-05-23 NOTE — Progress Notes (Signed)
IV team disconnect infusion tubing and tubing sent down to Blood Bank to discard. IV team made aware of CBC that needs to be drawn. Patient appears tolerated infusion well, patient bladder scan(385) and cath for that amount. Patient assessed for blood stool , only a smear to report and sacrum dressing changed

## 2017-05-24 DIAGNOSIS — K625 Hemorrhage of anus and rectum: Secondary | ICD-10-CM

## 2017-05-24 DIAGNOSIS — D649 Anemia, unspecified: Secondary | ICD-10-CM

## 2017-05-24 LAB — BPAM PLATELET PHERESIS
Blood Product Expiration Date: 201904212359
ISSUE DATE / TIME: 201904211309
UNIT TYPE AND RH: 6200

## 2017-05-24 LAB — GLUCOSE, CAPILLARY
GLUCOSE-CAPILLARY: 123 mg/dL — AB (ref 65–99)
Glucose-Capillary: 175 mg/dL — ABNORMAL HIGH (ref 65–99)
Glucose-Capillary: 80 mg/dL (ref 65–99)
Glucose-Capillary: 69 mg/dL (ref 65–99)

## 2017-05-24 LAB — CBC WITH DIFFERENTIAL/PLATELET
BASOS PCT: 0 %
Basophils Absolute: 0 10*3/uL (ref 0.0–0.1)
EOS PCT: 0 %
Eosinophils Absolute: 0 10*3/uL (ref 0.0–0.7)
HEMATOCRIT: 24.1 % — AB (ref 39.0–52.0)
HEMOGLOBIN: 8.3 g/dL — AB (ref 13.0–17.0)
Lymphocytes Relative: 14 %
Lymphs Abs: 0.9 10*3/uL (ref 0.7–4.0)
MCH: 29.5 pg (ref 26.0–34.0)
MCHC: 34.4 g/dL (ref 30.0–36.0)
MCV: 85.8 fL (ref 78.0–100.0)
MONO ABS: 0.8 10*3/uL (ref 0.1–1.0)
MONOS PCT: 13 %
NEUTROS PCT: 73 %
Neutro Abs: 4.5 10*3/uL (ref 1.7–7.7)
Platelets: 46 10*3/uL — ABNORMAL LOW (ref 150–400)
RBC: 2.81 MIL/uL — ABNORMAL LOW (ref 4.22–5.81)
RDW: 15.9 % — ABNORMAL HIGH (ref 11.5–15.5)
WBC: 6.2 10*3/uL (ref 4.0–10.5)

## 2017-05-24 LAB — PREPARE PLATELET PHERESIS: Unit division: 0

## 2017-05-24 LAB — BASIC METABOLIC PANEL
Anion gap: 19 — ABNORMAL HIGH (ref 5–15)
BUN: 53 mg/dL — ABNORMAL HIGH (ref 6–20)
CHLORIDE: 103 mmol/L (ref 101–111)
CO2: 18 mmol/L — AB (ref 22–32)
CREATININE: 4.21 mg/dL — AB (ref 0.61–1.24)
Calcium: 6.9 mg/dL — ABNORMAL LOW (ref 8.9–10.3)
GFR calc Af Amer: 18 mL/min — ABNORMAL LOW (ref >60)
GFR calc non Af Amer: 15 mL/min — ABNORMAL LOW (ref >60)
Glucose, Bld: 192 mg/dL — ABNORMAL HIGH (ref 65–99)
Potassium: 4.4 mmol/L (ref 3.5–5.1)
Sodium: 140 mmol/L (ref 135–145)

## 2017-05-24 NOTE — Procedures (Signed)
Patient was seen on dialysis and the procedure was supervised.  BFR 400 Via RIJ BP is  139/90.   Patient appears to be tolerating treatment well  Dron Jaynie Collins 05/24/2017

## 2017-05-24 NOTE — Progress Notes (Signed)
Patient taken to Dialysis in dialysis chair and patient tolerated transfer well. Medications held until patient return

## 2017-05-24 NOTE — Progress Notes (Signed)
[facility] NEPHROLOGY PROGRESS NOTE  Assessment/ Plan: Pt is a 52 y.o. 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 fib, diabetes, morbid obesity, CKD with baseline creatinine around 1.6, admitted for acute kidney injury, obstructive uropathy and hyperkalemia requiring intermittent hemodialysis.  Assessment/Plan:  #Acute kidney injury due to ATN from supratherapeutic vancomycin.  Patient is now getting intermittent hemodialysis via temporary catheter.  Hemodialysis today.  He is now able to lie on the stretcher during dialysis.  Vein mapping was done.  I will monitor his urine output and rise of serum creatinine level.  If no improvement, plan for permanent catheter for hemodialysis.  Monitor urine output and watch for renal recovery.  Avoid nephrotoxins.  #Neurogenic bladder/acute urinary retention secondary due to blockage of Foley catheter, underwent exchange by urology.  #Normocytic anemia: Check iron studies.  Monitor CBC.  #Severe thrombocytopenia thought to be due to drug-related/non-immune mediated.  Evaluated by hematologist.  May need platelet transfusion before catheter placement.  #Candidemia: Patient completed 10 days of anidulafungin.  #Proximal atrial fibrillation: On amiodarone, metoprolol and diltiazem.  No anticoagulation.  #Hypertension: Blood pressure acceptable.  Continue to monitor.  Subjective: Seen and examined at dialysis unit.  Denies chest pain or shortness of breath. Objective Vital signs in last 24 hours: Vitals:   05/24/17 1230 05/24/17 1300 05/24/17 1330 05/24/17 1400  BP: 111/63 (!) 148/47 90/60 120/70  Pulse: 81 79 83 88  Resp:      Temp:      TempSrc:      SpO2:      Weight:      Height:       Weight change: -3.629 kg (-8 lb)  Intake/Output Summary (Last 24 hours) at 05/24/2017 1518 Last data filed at 05/24/2017 0538 Gross per 24 hour  Intake 592 ml  Output 810 ml  Net -218 ml       Labs: Basic  Metabolic Panel: Recent Labs  Lab 05/21/17 0711 05/22/17 0528 05/22/17 1227 05/24/17 0432  NA 140 137 138 140  K 4.0 4.5 4.3 4.4  CL 104 102 104 103  CO2 25 25 24  18*  GLUCOSE 87 162* 172* 192*  BUN 24* 33* 37* 53*  CREATININE 2.76* 3.34* 3.45* 4.21*  CALCIUM 6.9* 7.0* 6.8* 6.9*  PHOS 2.9 3.8 4.1  --    Liver Function Tests: Recent Labs  Lab 05/21/17 0711 05/22/17 0528 05/22/17 1227  ALBUMIN 1.8* 1.9* 1.7*   No results for input(s): LIPASE, AMYLASE in the last 168 hours. No results for input(s): AMMONIA in the last 168 hours. CBC: Recent Labs  Lab 05/22/17 0528  05/22/17 2105 05/23/17 0755 05/23/17 2024 05/24/17 0432  WBC 6.0  --  6.4 7.3 7.5 6.2  NEUTROABS  --   --  5.9  --   --  4.5  HGB 8.6*   < > 6.3* 9.2* 8.6* 8.3*  HCT 25.7*   < > 19.2* 27.1* 25.0* 24.1*  MCV 86.2  --  87.7 85.8 85.3 85.8  PLT 14*  --  47* 36* 61* 46*   < > = values in this interval not displayed.   Cardiac Enzymes: Recent Labs  Lab 05/22/17 2117  TROPONINI 0.04*   CBG: Recent Labs  Lab 05/23/17 0825 05/23/17 1106 05/23/17 1657 05/23/17 2147 05/24/17 0521  GLUCAP 156* 146* 131* 194* 175*    Iron Studies: No results for input(s): IRON, TIBC, TRANSFERRIN, FERRITIN in the last 72 hours. Studies/Results: No results found.  Medications:  Infusions:   Scheduled Medications: . amiodarone  200 mg Oral Daily  . collagenase   Topical Daily  . dexamethasone  3 mg Oral Q8H  . diltiazem  60 mg Oral Q8H  . feeding supplement (ENSURE ENLIVE)  237 mL Oral BID BM  . guaiFENesin  600 mg Oral BID  . insulin aspart  0-5 Units Subcutaneous QHS  . insulin aspart  0-9 Units Subcutaneous TID WC  . insulin aspart  2 Units Subcutaneous TID WC  . insulin glargine  12 Units Subcutaneous Daily  . metoprolol tartrate  12.5 mg Oral BID  . mirtazapine  30 mg Oral QHS    have reviewed scheduled and prn medications.  Physical Exam: General: Not in distress, resting comfortably in  stretcher Heart: Regular rate rhythm, S1-S2 normal Lungs: Diminished bibasilar  breath sound Abdomen: Soft, obese, nontender Extremities: Bandage applied in both legs Dialysis Access: Right IJ non-tunneled HD catheter.  Dron Prasad Bhandari 05/24/2017,3:18 PM  LOS: 23 days

## 2017-05-24 NOTE — Progress Notes (Signed)
OT Cancellation Note  Patient Details Name: Dylan [name] MRN: [id number] DOB: [birth year]   Cancelled Treatment:    Reason Eval/Treat Not Completed: Patient at procedure or test/ unavailable.  Pt in HD   Valeria, OTR/L [phone]   [name] M 05/24/2017, 3:00 PM

## 2017-05-24 NOTE — Plan of Care (Signed)
  Problem: Clinical Measurements: Goal: Diagnostic test results will improve Outcome: Progressing   

## 2017-05-24 NOTE — Progress Notes (Signed)
PROGRESS NOTE    Dylan [name]  HBZ:[id number] DOB: [birth year] DOA: 04/28/2017 PCP: Patient, No Pcp Per   Brief Narrative:  HPI on 04/21/2017 by Dr. Daiva Nakayama is a 52 y.o. male with medical history significant of recent quadriplegia, MS, decompression of spinal abscess admitted with abdominal pain and hypoglycemia.  He was diagnosed with MS and spinal stenosis .  He was admitted to neurosurgery 04/12/2017.  He had surgery at C6 C5 and C7.  Postoperative course was complicated with atrial fibrillation placed on Eliquis followed by which epidural clot versus abscess was seen on his C-spine MRI.  Because of the high risk patient decided not to repeat exploratory surgery.  He was started on IV antibiotics due to complicated UTI possible cervical abscess and was discharged to skilled nursing facility on 04/09/2017 patient was supposed to be on vancomycin and cefepime until May 06, 2017.  But the vancomycin was stopped earlier due to increasing creatinine. Patient was found to have gradual progressive swelling of his neck a CT scan at that time showed prevertebral hematoma. When patient came in today his Foley catheter was not draining.  Urology was consulted.  After seeing the Foley a small piece of tissue was removed which was occluding the catheter.  Then he put out 2.5 L.  ED physician also spoke to nephrologist who would be standby if needed as a consult.  Interim history Patient was readmitted for acute kidney injury with concerns that his Foley catheter was on training and possible vancomycin related renal injury.  Urology was consulted and flushed Foley at that time, removing foreign body yielding 2.5 L of urine output.  Patient was also found to have hyperkalemia given Kayexalate.  Infectious disease recommended continuation of daptomycin and cefepime which has now been completed.  Blood cultures grew C. Gl[place]rata- PICC line was removed and patient started on anidulafungin.   Neurology was consulted and deferred MS treatment to the outpatient setting.  Nephrology consulted due to progressive oliguric renal failure, HD started. Found to have thrombocytopenia and developed anemia with GIB.   Assessment & Plan   Acute renal failure -Thought to be secondary to obstructive uropathy versus vancomycin-induced renal injury -Nephrology consulted and appreciated, patient has been dialyzing since 05/07/2017 -Nephrology ordering vein mapping -On 4/17, discussed with Dr. Hollie Salk, patient would likely need stretcher dialysis and [place] of state. Pending results of platelets this morning. Patient currently only has RIJ access (nontunneled HD). Patient may need platelet transfusion for more permanent access to be placed.  -Creatinine today 4.21 -last dialysis was 4/18, and patient was able to dialyze in a chair -Dialysis on 4/20 was cancelled due to GIB -?if patient will dialyze today  Severe thrombocytopenia -Thought to be drug related, Eraxis discontinued 5 days ago.  Daptomycin as well as cefepime are also stopped several days ago.  Nephrology discontinued Protonix. -HIT negative -Patient does not receive heparin with dialysis -Hematology consulted and appreciated-states resolution and recovery could take up to 7-10 days -no longer having GIB -has received several platelet transfusions -hematology feels that this is likely not immune mediated thrombocytopenia given patient's response to transfusions -currently platelets 46, continue to monitor CBC  GI bleed/Acute blood loss anemia/Acute on chronic anemia -will not start protonix at this time as it is likely a lower bleed and protonix may add to thrombocytopenia -hemoglobin dropped to 6.3 overnight, was transfused 2uPRBC -hemoglobin currently 8.3 -Gastroenterology consulted and appreciated -No further bleeding noted  Elevated troponin -not sure why troponin was ordered -troponin mildly elevated  0.04 -patient denies chest pain -suspect demand ischemia given anemia  Quadriplegia/cervical abscess versus cervical hematoma versus infected cervical hematoma -Patient completed 6 weeks of antibiotics including 4 weeks of vancomycin and cefepime followed by 2 weeks of daptomycin and cefepime, course completed on 05/16/2017  Neurogenic bladder -Patient was discharged with a Foley catheter on 04/09/2017 in the context of new quadriplegia -Urology consulted and appreciated, Foley catheter was flushed yielding 2.5 L -continue In/Out cath as needed  Candidemia -Patient had 10 days of anidulafungin (discontinued due to thrombocytopenia) -Ophthalmology consulted, found no evidence of intraocular fungal involvement -Echocardiogram showed no vegetation  Diabetes mellitus, type II with nephropathy -Continue Lantus, insulin sliding scale and CBG monitoring  Paroxysmal atrial fibrillation -CHADSVASC 1 -Given thrombocytopenia, anticoagulation contraindicated -Continue amiodarone, metoprolol, diltiazem   Multiple sclerosis -Recently diagnosed and was given 5 days of high-dose steroids with improvement (previous admission) -Neurology consulted and appreciated recommended outpatient follow-up  Adjustment disorder versus depression -Continue mirtazapine -Psychiatry consulted and appreciated, recommended trazodone   Hyponatremia -Resolved, Na 140 -continue to monitor BMP  Unstageable pressure injury to the bilateral buttocks -Present on admission -Continue wound care  Nocturnal hypoxia -No history of obstructive sleep apnea or the use of CPAP -Continue supplemental oxygen/CPAP at night  Goals of care -Palliative care consulted and appreciated-patient does have a living will in the chart  DVT Prophylaxis  SCDs  Code Status: Full  Family Communication: None at bedside  Disposition Plan: Admitted, pending improvement in thrombocytopenia as well as decision regarding dialysis. Dispo  will be SNF when patient is medically stable  Consultants Nephrology Neurology Psychiatry Infectious disease Ophthalmology Palliative care Gastroenterology  Procedures  Echocardiogram Upper ext vein mapping  Antibiotics   Anti-infectives (From admission, onward)   Start     Dose/Rate Route Frequency Ordered Stop   05/08/17 2000  DAPTOmycin (CUBICIN) 900 mg in sodium chloride 0.9 % IVPB     900 mg 236 mL/hr over 30 Minutes Intravenous Every 48 hours 05/07/17 1114 05/16/17 2230   05/05/17 2000  anidulafungin (ERAXIS) 100 mg in sodium chloride 0.9 % 100 mL IVPB  Status:  Discontinued     100 mg 78 mL/hr over 100 Minutes Intravenous Every 24 hours 05/04/17 1908 05/14/17 1751   05/04/17 2000  anidulafungin (ERAXIS) 200 mg in sodium chloride 0.9 % 200 mL IVPB     200 mg 78 mL/hr over 200 Minutes Intravenous  Once 05/04/17 1908 05/05/17 0000   05/02/17 1200  DAPTOmycin (CUBICIN) 1,000 mg in sodium chloride 0.9 % IVPB  Status:  Discontinued     1,000 mg 240 mL/hr over 30 Minutes Intravenous Every 48 hours 05/02/17 1143 05/07/17 1114   05/02/2017 1900  ceFEPIme (MAXIPIME) 1 g in sodium chloride 0.9 % 100 mL IVPB     1 g 200 mL/hr over 30 Minutes Intravenous Daily after supper 04/22/2017 1833 05/16/17 1850   04/30/2017 1800  ceFEPime (MAXIPIME) IVPB  Status:  Discontinued    Note to Pharmacy:  Indication:  Spinal abscess Last Day of Therapy:  05/16/2017 Labs - Once weekly:  CBC/D and BMP, Labs - Every other week:  ESR and CRP     2 g Intravenous Every 12 hours 04/06/2017 1757 04/04/2017 1833   04/06/2017 1245  vancomycin (VANCOCIN) 2,000 mg in sodium chloride 0.9 % 500 mL IVPB     2,000 mg 250 mL/hr over 120 Minutes Intravenous  Once 04/19/2017 1231 04/16/2017 1757  04/27/2017 1230  piperacillin-tazobactam (ZOSYN) IVPB 3.375 g     3.375 g 100 mL/hr over 30 Minutes Intravenous  Once 04/10/2017 1217 04/24/2017 1407   05/02/2017 1230  vancomycin (VANCOCIN) IVPB 1000 mg/200 mL premix  Status:  Discontinued      1,000 mg 200 mL/hr over 60 Minutes Intravenous  Once 04/30/2017 1217 04/02/2017 1231      Subjective:   Dylan [name] seen and examined today.  Currently feeling tired. Denies chest pain, shortness of breath, abdominal pain, nausea, vomiting, diarrhea, constipation, dizziness, headache. Wonders if he will dialyze today.   Objective:   Vitals:   05/24/17 0400 05/24/17 0500 05/24/17 0517 05/24/17 0737  BP: 137/81  128/87 120/78  Pulse: 76   70  Resp: 17   18  Temp: 97.6 F (36.4 C)   97.8 F (36.6 C)  TempSrc: Oral   Axillary  SpO2: 100%   100%  Weight:  130.2 kg (287 lb 0.6 oz)    Height:        Intake/Output Summary (Last 24 hours) at 05/24/2017 1006 Last data filed at 05/24/2017 0538 Gross per 24 hour  Intake 592 ml  Output 810 ml  Net -218 ml   Filed Weights   05/23/17 2002 05/24/17 0021 05/24/17 0500  Weight: 123.4 kg (272 lb) 130.2 kg (287 lb) 130.2 kg (287 lb 0.6 oz)   Exam  General: Well developed, chronically ill appearing, NAD  HEENT: NCAT, mucous membranes moist.   Neck: Supple  Cardiovascular: S1 S2 auscultated, RRR, no murmur   Respiratory: Clear to auscultation bilaterally with equal chest rise  Abdomen: Soft, obese, nontender, nondistended, + bowel sounds  Extremities: warm dry without cyanosis clubbing. LE edema  Neuro: AAOx3, no new changes  Psych: Depressed/flat, however appropriate   Data Reviewed: I have personally reviewed following labs and imaging studies  CBC: Recent Labs  Lab 05/22/17 0528 05/22/17 1227 05/22/17 2105 05/23/17 0755 05/23/17 2024 05/24/17 0432  WBC 6.0  --  6.4 7.3 7.5 6.2  NEUTROABS  --   --  5.9  --   --  4.5  HGB 8.6* 7.7* 6.3* 9.2* 8.6* 8.3*  HCT 25.7* 22.7* 19.2* 27.1* 25.0* 24.1*  MCV 86.2  --  87.7 85.8 85.3 85.8  PLT 14*  --  47* 36* 61* 46*   Basic Metabolic Panel: Recent Labs  Lab 05/18/17 0459 05/20/17 0449 05/21/17 0711 05/22/17 0528 05/22/17 1227 05/24/17 0432  NA 138 141 140 137 138 140   K 4.3 3.8 4.0 4.5 4.3 4.4  CL 103 107 104 102 104 103  CO2 21* 24 25 25 24  18*  GLUCOSE 149* 96 87 162* 172* 192*  BUN 66* 43* 24* 33* 37* 53*  CREATININE 5.64* 4.10* 2.76* 3.34* 3.45* 4.21*  CALCIUM 6.7* 6.8* 6.9* 7.0* 6.8* 6.9*  PHOS 5.9* 3.5 2.9 3.8 4.1  --    GFR: Estimated Creatinine Clearance: 31.3 mL/min (A) (by C-G formula based on SCr of 4.21 mg/dL (H)). Liver Function Tests: Recent Labs  Lab 05/18/17 0459 05/20/17 0449 05/21/17 0711 05/22/17 0528 05/22/17 1227  ALBUMIN 1.7* 1.8* 1.8* 1.9* 1.7*   No results for input(s): LIPASE, AMYLASE in the last 168 hours. No results for input(s): AMMONIA in the last 168 hours. Coagulation Profile: Recent Labs  Lab 05/22/17 0846  INR 1.21   Cardiac Enzymes: Recent Labs  Lab 05/22/17 2117  TROPONINI 0.04*   BNP (last 3 results) No results for input(s): PROBNP in the last 8760 hours. HbA1C:  No results for input(s): HGBA1C in the last 72 hours. CBG: Recent Labs  Lab 05/23/17 0825 05/23/17 1106 05/23/17 1657 05/23/17 2147 05/24/17 0521  GLUCAP 156* 146* 131* 194* 175*   Lipid Profile: No results for input(s): CHOL, HDL, LDLCALC, TRIG, CHOLHDL, LDLDIRECT in the last 72 hours. Thyroid Function Tests: No results for input(s): TSH, T4TOTAL, FREET4, T3FREE, THYROIDAB in the last 72 hours. Anemia Panel: No results for input(s): VITAMINB12, FOLATE, FERRITIN, TIBC, IRON, RETICCTPCT in the last 72 hours. Urine analysis:    Component Value Date/Time   COLORURINE YELLOW 04/25/2017 0920   APPEARANCEUR TURBID (A) 04/30/2017 0920   LABSPEC 1.015 04/28/2017 0920   PHURINE 6.0 04/27/2017 0920   GLUCOSEU NEGATIVE 04/27/2017 0920   HGBUR LARGE (A) 04/26/2017 0920   BILIRUBINUR NEGATIVE 04/10/2017 0920   KETONESUR NEGATIVE 04/29/2017 0920   PROTEINUR 30 (A) 04/17/2017 0920   NITRITE NEGATIVE 04/26/2017 0920   LEUKOCYTESUR MODERATE (A) 04/28/2017 0920   Sepsis Labs: @LABRCNTIP (procalcitonin:4,lacticidven:4)  ) Recent  Results (from the past 240 hour(s))  MRSA PCR Screening     Status: None   Collection Time: 05/20/17 11:31 AM  Result Value Ref Range Status   MRSA by PCR NEGATIVE NEGATIVE Final    Comment:        The GeneXpert MRSA Assay (FDA approved for NASAL specimens only), is one component of a comprehensive MRSA colonization surveillance program. It is not intended to diagnose MRSA infection nor to guide or monitor treatment for MRSA infections. Performed at [facility], [place] [address], [place],  [address]       Radiology Studies: No results found.   Scheduled Meds: . amiodarone  200 mg Oral Daily  . collagenase   Topical Daily  . dexamethasone  3 mg Oral Q8H  . diltiazem  60 mg Oral Q8H  . feeding supplement (ENSURE ENLIVE)  237 mL Oral BID BM  . guaiFENesin  600 mg Oral BID  . insulin aspart  0-5 Units Subcutaneous QHS  . insulin aspart  0-9 Units Subcutaneous TID WC  . insulin aspart  2 Units Subcutaneous TID WC  . insulin glargine  12 Units Subcutaneous Daily  . metoprolol tartrate  12.5 mg Oral BID  . mirtazapine  30 mg Oral QHS   Continuous Infusions:    LOS: 23 days   Time Spent in minutes   45 minutes  Laelia Angelo D.O. on 05/24/2017 at 10:06 AM  Between 7am to 7pm - Pager - [phone]  After 7pm go to [email or web address] - password [email or web address]  And look for the night coverage person covering for me after hours  [facility] Office  [phone]

## 2017-05-24 NOTE — Consult Note (Addendum)
[facility] Consult: 11:44 AM 05/24/2017  LOS: 23 days    Referring Provider: Dr Ree Kida  Primary Care Physician:  Patient, No Pcp Per Primary Gastroenterologist:  unassigned    Reason for Consultation:  Lower GI bleeding in setting of thrombocytopenia   HPI: Keenon A Appleyard is a 52 y.o. male.  Hx multiple sclerosis, diagnosed 03/2017.  Type 2 DM, started insulin 03/2017.  Morbid obesity.  Spinal stenosis.  Status post 03/25/17 spinal  decompression.  Surgery complicated by post op hematoma after starting Eliquis (now discontinued) for new onset A fib.  Possible cervical abscess.  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  AKI at readmission 3/30.  Now receiving HD.  Question if this is due to occluded Foley catheter versus vancomycin nephrotoxicity. Has developed  thrombocytopenia with platelets as low as 11 K on 4/19.  He developed hematochezia on 4/20.  The first episode was on Friday evening when he had a "bloody bowel movement".  On Saturday he had to more episodes of hematochezia which covered the chuck underneath him so a fairly large volume.  The nurse saw clots of blood size of a fist.  On Sunday he had a small amount of blood in the morning and later in the day had just a smear of blood and nothing since then.  He received transfusion with 2 U platelets and 2 U PRBCs, all on 4/21.  Latest platelet readings are 36-61 within the last 48 hours.  Hgb nadir 6.3 on 4/20.  The RN who was following him over the weekend and is seeing him today as well says that he never complained of abdominal pain there has been no nausea or vomiting.  He is tolerating solid diet.  The patient is not aware of how much he bled.  He denies prior rectal bleeding.  No prior colonoscopy or upper endoscopy.   No Fm Hx of colorectal cancer, GI bleeding, ulcers,  anemia so far as he knows.   Past Medical History:  Diagnosis Date  . 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 Diabetes mellitus without complication ([place])   . Hypertension   . MS (congenital mitral stenosis) 2019  . Persistent atrial fibrillation with rapid ventricular response ([place]) 03/27/2017    Past Surgical History:  Procedure Laterality Date  . ANTERIOR CERVICAL DECOMP/DISCECTOMY FUSION N/A 03/25/2017   Procedure: ANTERIOR CERVICAL DECOMPRESSION/DISCECTOMY FUSION CERVICAL FIVE- CERVICAL SIX, CERVICAL SIX- CERVICAL SEVEN, CORPECTOMY;  Surgeon: Consuella Lose, MD;  Location: [facility];  Service: Neurosurgery;  Laterality: N/A;  . IR FLUORO GUIDE CV LINE RIGHT  05/07/2017  . IR US GUIDE VASC ACCESS RIGHT  05/07/2017  . NOSE SURGERY     AS A CHILD    Prior to Admission medications   Medication Sig Start Date End Date Taking? Authorizing Provider  acetaminophen (TYLENOL) 325 MG tablet Take 650 mg by mouth every 4 (four) hours as needed. 04/09/17  Yes [provider]  amiodarone (PACERONE) 200 MG tablet Take 1 tablet (200 mg total) by mouth daily. 04/10/17  Yes Cherene Altes, MD  b complex vitamins  tablet Take 1 tablet by mouth daily.   Yes [provider]  bisacodyl (DULCOLAX) 5 MG EC tablet Take 1 tablet (5 mg total) by mouth daily as needed for moderate constipation. 04/09/17  Yes Cherene Altes, MD  ceFEPime (MAXIPIME) IVPB Inject 2 g into the vein every 12 (twelve) hours. Indication:  Spinal abscess Last Day of Therapy:  05/16/2017 Labs - Once weekly:  CBC/D and BMP, Labs - Every other week:  ESR and CRP 04/09/17  Yes Cherene Altes, MD  collagenase (SANTYL) ointment Apply 1 application topically daily as needed (wound care).    Yes [provider]  dexamethasone (DECADRON) 1.5 MG tablet Take 2 tablets (3 mg total) by mouth every 8 (eight) hours. 04/09/17  Yes Cherene Altes, MD  diltiazem (CARDIZEM) 30 MG tablet Take 1 tablet (30 mg total) by mouth every 8  (eight) hours. 04/09/17  Yes Cherene Altes, MD  GLUCERNA [facility]) LIQD Glucerna 1.5 - Give 240 ml three times daily 04/09/17  Yes [provider]  insulin aspart (NOVOLOG FLEXPEN) 100 UNIT/ML FlexPen Inject as per sliding scale subcutaneously before meals: 0 - 120 = 0 Units 121 - 150 = 2 units 151 - 200 = 4 units 201 - 250 = 6 units 251 - 300 = 8 units 301 - 350 = 10 units 351 - 400 = 12 units 401 - 450 = 14 units Notify Provider is less than 60 or greater than 450   Yes [provider]  insulin aspart (NOVOLOG FLEXPEN) 100 UNIT/ML FlexPen Inject 10 Units into the skin 3 (three) times daily with meals.  04/20/17  Yes [provider]  Insulin Glargine (LANTUS SOLOSTAR) 100 UNIT/ML Solostar Pen Inject 70 Units into the skin at bedtime.  04/22/17  Yes [provider]  LORazepam (ATIVAN) 0.5 MG tablet Take 1 tablet (0.5 mg total) by mouth every 6 (six) hours as needed for anxiety. X 2 weeks 04/28/17  Yes Gerlene Fee, NP  Melatonin 3 MG TABS Take 3 mg by mouth at bedtime.   Yes [provider]  metFORMIN (GLUMETZA) 1000 MG (MOD) 24 hr tablet Take 1,000 mg by mouth daily with breakfast.   Yes [provider]  methocarbamol (ROBAXIN) 500 MG tablet Take 1 tablet (500 mg total) by mouth every 6 (six) hours as needed for muscle spasms. 04/09/17  Yes Cherene Altes, MD  Multiple Vitamin (MULTIVITAMIN) tablet Take 1 tablet by mouth daily. 04/17/17  Yes [provider]  ondansetron (ZOFRAN) 4 MG tablet Take 1 tablet (4 mg total) by mouth every 6 (six) hours as needed for nausea. 04/09/17  Yes Cherene Altes, MD  pantoprazole (PROTONIX) 40 MG tablet Take 1 tablet (40 mg total) by mouth daily. 04/10/17  Yes Cherene Altes, MD  promethazine (PHENERGAN) 25 MG/ML injection Inject 25 mg into the vein every 8 (eight) hours as needed for nausea or vomiting.   Yes [provider]  senna-docusate (SENOKOT-S) 8.6-50 MG tablet Take 1  tablet by mouth at bedtime as needed for mild constipation. 04/09/17  Yes Cherene Altes, MD  sertraline (ZOLOFT) 50 MG tablet Take 50 mg by mouth daily.  04/29/17  Yes [provider]  sodium chloride 0.9 % injection Inject 10 mLs into the vein 2 (two) times daily. Before and after antibiotics   Yes [provider]  traZODone (DESYREL) 50 MG tablet Take 50 mg by mouth at bedtime as needed for sleep.   Yes  [provider]  zolpidem (AMBIEN) 10 MG tablet Take 10 mg by mouth at bedtime as needed for sleep. 04/28/17  Yes [provider]  vancomycin IVPB Inject 500 mg into the vein every 12 (twelve) hours. Indication:  Spinal abscess Last Day of Therapy:  05/16/2017 Labs - Sunday/Monday:  CBC/D, BMP, and vancomycin trough. Labs - Thursday:  BMP and vancomycin trough Labs - Every other week:  ESR and CRP Patient not taking: Reported on 04/23/2017 04/09/17   Cherene Altes, MD    Scheduled Meds: . amiodarone  200 mg Oral Daily  . collagenase   Topical Daily  . dexamethasone  3 mg Oral Q8H  . diltiazem  60 mg Oral Q8H  . feeding supplement (ENSURE ENLIVE)  237 mL Oral BID BM  . guaiFENesin  600 mg Oral BID  . insulin aspart  0-5 Units Subcutaneous QHS  . insulin aspart  0-9 Units Subcutaneous TID WC  . insulin aspart  2 Units Subcutaneous TID WC  . insulin glargine  12 Units Subcutaneous Daily  . metoprolol tartrate  12.5 mg Oral BID  . mirtazapine  30 mg Oral QHS   Infusions:  PRN Meds: acetaminophen, benzonatate, bisacodyl, ipratropium-albuterol, labetalol, lidocaine (PF), LORazepam, methocarbamol, ondansetron (ZOFRAN) IV, ondansetron, sodium chloride flush, traZODone   Allergies as of 04/29/2017  . (No Known Allergies)    Family History  Problem Relation Age of Onset  . Prostate cancer Father     Social History   Socioeconomic History  . Marital status: Single    Spouse name: Not on file  . Number of children: Not on file  . Years of  education: Not on file  . Highest education level: Not on file  Occupational History  . Not on file  Social Needs  . Financial resource strain: Not on file  . Food insecurity:    Worry: Not on file    Inability: Not on file  . Transportation needs:    Medical: Not on file    Non-medical: Not on file  Tobacco Use  . Smoking status: Never Smoker  . Smokeless tobacco: Never Used  Substance and Sexual Activity  . Alcohol use: No    Frequency: Never  . Drug use: No  . Sexual activity: Not on file  Lifestyle  . Physical activity:    Days per week: Not on file    Minutes per session: Not on file  . Stress: Not on file  Relationships  . Social connections:    Talks on phone: Not on file    Gets together: Not on file    Attends religious service: Not on file    Active member of club or organization: Not on file    Attends meetings of clubs or organizations: Not on file    Relationship status: Not on file  . Intimate partner violence:    Fear of current or ex partner: Not on file    Emotionally abused: Not on file    Physically abused: Not on file    Forced sexual activity: Not on file  Other Topics Concern  . Not on file  Social History Narrative  . Not on file    REVIEW OF SYSTEMS: Constitutional: Weakness ENT:  No nose bleeds Pulm: Denies difficulty breathing.  Not coughing. CV:  no chest pain.  Swelling in his lower extremities has been Ace wrapped GU: Neurogenic bladder due to quadriplegia has required Foley catheter. GI:  Per HPI Heme:  Per HPI  Transfusions:  Per HPI.   Neuro:  No headaches, no seizure.   Derm:  No itching, no rash or sores.  Endocrine:  No sweats or chills.  No polyuria or dysuria Immunization:  Not queried    PHYSICAL EXAM: Vital signs in last 24 hours: Vitals:   05/24/17 1100 05/24/17 1130  BP: (!) 145/85 138/87  Pulse: 78 81  Resp:    Temp:    SpO2:     Wt Readings from Last 3 Encounters:  05/24/17 287 lb 0.6 oz (130.2 kg)    04/30/17 246 lb 6.4 oz (111.8 kg)  04/29/17 266 lb 6.4 oz (120.8 kg)    General: Pleasant, obese, comfortable, ill-appearing AAM Head: No facial asymmetry or swelling.  No signs of head trauma. Eyes: No scleral icterus.   Conjunctival pale. Ears: Not hard of hearing Nose: Discharge Mouth: Fair dentition, some missing teeth.  Oral mucosa pink, moist, clear. Neck: No JVD, no masses, no thyromegaly. Lungs: Catheter for dialysis is on the right upper chest.  Lungs clear when auscultated in the front.  No labored breathing.  No cough. Heart: RRR.  No MRG.  S1, S2 present. Abdomen: Soft.  Obese.  Not tender or distended.  Bowel sounds normal active.  No masses, no hernias, no HSM.Marland Kitchen   Rectal: Deferred rectal exam, patient was in the hemodialysis unit in a chair. Musc/Skeltl: No joint redness or swelling. Extremities: Swelling in his lower legs covered by Ace wrap bandage. Neurologic: Oriented to place, self, person.  Some psychomotor retardation and slow speech.  Able to lift his arms against gravity and move his fingers.  Unable to move his legs.  No tremors. Skin: No rashes or sores. Tattoos: None seen. Nodes: Cervical adenopathy. Psych: Pleasant, calm.  Intake/Output from previous day: 04/21 0701 - 04/22 0700 In: 592 [P.O.:120; Blood:472] Out: 810 [Urine:810] Intake/Output this shift: No intake/output data recorded.  LAB RESULTS: Recent Labs    05/23/17 0755 05/23/17 2024 05/24/17 0432  WBC 7.3 7.5 6.2  HGB 9.2* 8.6* 8.3*  HCT 27.1* 25.0* 24.1*  PLT 36* 61* 46*   BMET Lab Results  Component Value Date   NA 140 05/24/2017   NA 138 05/22/2017   NA 137 05/22/2017   K 4.4 05/24/2017   K 4.3 05/22/2017   K 4.5 05/22/2017   CL 103 05/24/2017   CL 104 05/22/2017   CL 102 05/22/2017   CO2 18 (L) 05/24/2017   CO2 24 05/22/2017   CO2 25 05/22/2017   GLUCOSE 192 (H) 05/24/2017   GLUCOSE 172 (H) 05/22/2017   GLUCOSE 162 (H) 05/22/2017   BUN 53 (H) 05/24/2017   BUN 37 (H)  05/22/2017   BUN 33 (H) 05/22/2017   CREATININE 4.21 (H) 05/24/2017   CREATININE 3.45 (H) 05/22/2017   CREATININE 3.34 (H) 05/22/2017   CALCIUM 6.9 (L) 05/24/2017   CALCIUM 6.8 (L) 05/22/2017   CALCIUM 7.0 (L) 05/22/2017   LFT Recent Labs    05/22/17 0528 05/22/17 1227  ALBUMIN 1.9* 1.7*   PT/INR Lab Results  Component Value Date   INR 1.21 05/22/2017   INR 1.27 05/12/2017   INR 1.37 04/11/2017   Hepatitis Panel No results for input(s): HEPBSAG, HCVAB, HEPAIGM, HEPBIGM in the last 72 hours. C-Diff No components found for: CDIFF Lipase     Component Value Date/Time   LIPASE 25 04/26/2017 0946    Drugs of Abuse  No results found for: LABOPIA, COCAINSCRNUR, LABBENZ, AMPHETMU, THCU, LABBARB   RADIOLOGY STUDIES:  No results found.    IMPRESSION:   *   Acute thrombocytopenia.  On 4/11 Dr Alen Blew notes "Etiology of his thrombocytopenia will be difficult to determine but the differential diagnosis includes medication effect versus reactive thrombocytopenia related to sepsis or consumptive process related to his hematoma".  On 4/20: "Likely related to medication". "Immune thrombocytopenia is the only other possibility".   *  Rectal bleeding in setting of thrombocytopenia. PT/INR ok  *   Acute anemia.  Multifactorial.      *  AKI.  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  Now quadriplegic with limited UE movement.    *  A fib, xarelto discontinued in 04/2017  *   MS   PLAN:     *  Per Dr Hilarie Fredrickson.  Given that the bleeding has all but ceased now that his platelets have improved, prefer to avoid pursuing colonoscopy.  This can be done in the future, as an outpatient.  Right now the patient has enough on his plate and we can defer colonoscopy indefinitely.   Azucena Freed  05/24/2017, 11:44 AM Phone [phone]

## 2017-05-24 NOTE — Progress Notes (Signed)
Physical Therapy Cancellation Note  Pt in HD. PT will check on pt later as time allows.   05/24/17 1142  PT Visit Information  Last PT Received On 05/24/17  Reason Eval/Treat Not Completed Patient at procedure or test/unavailable  Erline Levine, PTA Pager: [phone]

## 2017-05-25 LAB — CBC
HEMATOCRIT: 24.4 % — AB (ref 39.0–52.0)
HEMOGLOBIN: 8.2 g/dL — AB (ref 13.0–17.0)
MCH: 29.2 pg (ref 26.0–34.0)
MCHC: 33.6 g/dL (ref 30.0–36.0)
MCV: 86.8 fL (ref 78.0–100.0)
Platelets: 31 10*3/uL — ABNORMAL LOW (ref 150–400)
RBC: 2.81 MIL/uL — AB (ref 4.22–5.81)
RDW: 15.6 % — ABNORMAL HIGH (ref 11.5–15.5)
WBC: 5.1 10*3/uL (ref 4.0–10.5)

## 2017-05-25 LAB — BASIC METABOLIC PANEL
Anion gap: 9 (ref 5–15)
BUN: 23 mg/dL — AB (ref 6–20)
CHLORIDE: 100 mmol/L — AB (ref 101–111)
CO2: 27 mmol/L (ref 22–32)
Calcium: 6.7 mg/dL — ABNORMAL LOW (ref 8.9–10.3)
Creatinine, Ser: 2.46 mg/dL — ABNORMAL HIGH (ref 0.61–1.24)
GFR calc Af Amer: 34 mL/min — ABNORMAL LOW (ref >60)
GFR calc non Af Amer: 29 mL/min — ABNORMAL LOW (ref >60)
GLUCOSE: 81 mg/dL (ref 65–99)
POTASSIUM: 3.4 mmol/L — AB (ref 3.5–5.1)
Sodium: 136 mmol/L (ref 135–145)

## 2017-05-25 LAB — RENAL FUNCTION PANEL
Albumin: 2 g/dL — ABNORMAL LOW (ref 3.5–5.0)
Anion gap: 10 (ref 5–15)
BUN: 29 mg/dL — ABNORMAL HIGH (ref 6–20)
CHLORIDE: 100 mmol/L — AB (ref 101–111)
CO2: 25 mmol/L (ref 22–32)
CREATININE: 2.82 mg/dL — AB (ref 0.61–1.24)
Calcium: 6.8 mg/dL — ABNORMAL LOW (ref 8.9–10.3)
GFR calc non Af Amer: 25 mL/min — ABNORMAL LOW (ref >60)
GFR, EST AFRICAN AMERICAN: 28 mL/min — AB (ref >60)
Glucose, Bld: 154 mg/dL — ABNORMAL HIGH (ref 65–99)
Phosphorus: 3.6 mg/dL (ref 2.5–4.6)
Potassium: 4 mmol/L (ref 3.5–5.1)
Sodium: 135 mmol/L (ref 135–145)

## 2017-05-25 LAB — GLUCOSE, CAPILLARY
GLUCOSE-CAPILLARY: 91 mg/dL (ref 65–99)
Glucose-Capillary: 94 mg/dL (ref 65–99)
Glucose-Capillary: 115 mg/dL — ABNORMAL HIGH (ref 65–99)
Glucose-Capillary: 201 mg/dL — ABNORMAL HIGH (ref 65–99)

## 2017-05-25 LAB — IRON AND TIBC
Iron: 26 ug/dL — ABNORMAL LOW (ref 45–182)
Saturation Ratios: 19 % (ref 17.9–39.5)
TIBC: 140 ug/dL — ABNORMAL LOW (ref 250–450)
UIBC: 114 ug/dL

## 2017-05-25 LAB — FERRITIN: FERRITIN: 1689 ng/mL — AB (ref 24–336)

## 2017-05-25 MED ORDER — POTASSIUM CHLORIDE CRYS ER 20 MEQ PO TBCR
20.0000 meq | EXTENDED_RELEASE_TABLET | Freq: Once | ORAL | Status: AC
  Administered 2017-05-25: 20 meq via ORAL

## 2017-05-25 NOTE — Progress Notes (Signed)
PROGRESS NOTE    Dylan [name]  MRN:[id number] DOB: 03/13/1965 DOA: 04/30/2017 PCP: Patient, No Pcp Per   Brief Narrative:  HPI on 04/03/2017 by Dr. Elizabeth Matthews Dylan [name] is a 52 y.o. male with medical history significant of recent quadriplegia, MS, decompression of spinal abscess admitted with abdominal pain and hypoglycemia.  He was diagnosed with MS and spinal stenosis .  He was admitted to neurosurgery 04/12/2017.  He had surgery at C6 C5 and C7.  Postoperative course was complicated with atrial fibrillation placed on Eliquis followed by which epidural clot versus abscess was seen on his C-spine MRI.  Because of the high risk patient decided not to repeat exploratory surgery.  He was started on IV antibiotics due to complicated UTI possible cervical abscess and was discharged to skilled nursing facility on 04/09/2017 patient was supposed to be on vancomycin and cefepime until May 06, 2017.  But the vancomycin was stopped earlier due to increasing creatinine. Patient was found to have gradual progressive swelling of his neck a CT scan at that time showed prevertebral hematoma. When patient came in today his Foley catheter was not draining.  Urology was consulted.  After seeing the Foley a small piece of tissue was removed which was occluding the catheter.  Then he put out 2.5 L.  ED physician also spoke to nephrologist who would be standby if needed as a consult.  Interim history Patient was readmitted for acute kidney injury with concerns that his Foley catheter was on training and possible vancomycin related renal injury.  Urology was consulted and flushed Foley at that time, removing foreign body yielding 2.5 L of urine output.  Patient was also found to have hyperkalemia given Kayexalate.  Infectious disease recommended continuation of daptomycin and cefepime which has now been completed.  Blood cultures grew C. [place]- PICC line was removed and patient started on anidulafungin.   Neurology was consulted and deferred MS treatment to the outpatient setting.  Nephrology consulted due to progressive oliguric renal failure, HD started. Found to have thrombocytopenia and developed anemia with GIB.   Assessment & Plan   Acute renal failure -Thought to be secondary to obstructive uropathy versus vancomycin-induced renal injury -Nephrology consulted and appreciated, patient has been dialyzing since 05/07/2017 -Nephrology ordering vein mapping -On 4/17, discussed with Dr. Upton, patient would likely need stretcher dialysis and [place] have [place] of state. Pending results of platelets this morning. Patient currently only has RIJ access (nontunneled HD). Patient may need platelet transfusion for more permanent access to be placed.  -Creatinine today 2.46 -last dialysis was on 05/24/2017 (was able to dialyze in a chair)  Severe thrombocytopenia -Thought to be drug related, Eraxis discontinued 5 days ago.  Daptomycin as well as cefepime are also stopped several days ago.  Nephrology discontinued Protonix. -HIT negative -Patient does not receive heparin with dialysis -Hematology consulted and appreciated-states resolution and recovery could take up to 7-10 days -no longer having GIB -has received several platelet transfusions -hematology feels that this is likely not immune mediated thrombocytopenia given patient's response to transfusions -currently platelets 31, continue to monitor CBC  GI bleed/Acute blood loss anemia/Acute on chronic anemia -will not start protonix at this time as it is likely a lower bleed and protonix may add to thrombocytopenia -hemoglobin dropped to 6.3 overnight, was transfused 2uPRBC -hemoglobin currently 8.2- continue to monitor CBC -No further bleeding noted for the past 48 hours -Gastroenterology consulted and appreciated- recommended continuing close monitoring. If   patient has further bleeding, reconsult. Platelets would need to be >50K for  colonscopy.  Elevated troponin -not sure why troponin was ordered -troponin mildly elevated 0.04 -patient denies chest pain -suspect demand ischemia given anemia  Quadriplegia/cervical abscess versus cervical hematoma versus infected cervical hematoma -Patient completed 6 weeks of antibiotics including 4 weeks of vancomycin and cefepime followed by 2 weeks of daptomycin and cefepime, course completed on 05/16/2017  Neurogenic bladder -Patient was discharged with a Foley catheter on 04/09/2017 in the context of new quadriplegia -Urology consulted and appreciated, Foley catheter was flushed yielding 2.5 L -continue In/Out cath as needed  Candidemia -Patient had 10 days of anidulafungin (discontinued due to thrombocytopenia) -Ophthalmology consulted, found no evidence of intraocular fungal involvement -Echocardiogram showed no vegetation  Diabetes mellitus, type II with nephropathy -Continue Lantus, insulin sliding scale and CBG monitoring  Paroxysmal atrial fibrillation -CHADSVASC 1 -Given thrombocytopenia, anticoagulation contraindicated -Continue amiodarone, metoprolol, diltiazem   Multiple sclerosis -Recently diagnosed and was given 5 days of high-dose steroids with improvement (previous admission) -Neurology consulted and appreciated recommended outpatient follow-up  Adjustment disorder versus depression -Continue mirtazapine -Psychiatry consulted and appreciated, recommended trazodone   Hyponatremia -Resolved, Na 136 -continue to monitor BMP  Hypokalemia -K 3.4 -will give 81mq KCl and monitor BMP  Unstageable pressure injury to the bilateral buttocks -Present on admission -Continue wound care  Nocturnal hypoxia -No history of obstructive sleep apnea or the use of CPAP -Continue supplemental oxygen/CPAP at night  Goals of care -Palliative care consulted and appreciated-patient does have a living will in the chart  DVT Prophylaxis  SCDs  Code Status:  Full  Family Communication: None at bedside  Disposition Plan: Admitted, pending improvement in thrombocytopenia as well as decision regarding dialysis. Dispo will be SNF when patient is medically stable  Consultants Nephrology Neurology Psychiatry Infectious disease Ophthalmology Palliative care Gastroenterology  Procedures  Echocardiogram Upper ext vein mapping  Antibiotics   Anti-infectives (From admission, onward)   Start     Dose/Rate Route Frequency Ordered Stop   05/08/17 2000  DAPTOmycin (CUBICIN) 900 mg in sodium chloride 0.9 % IVPB     900 mg 236 mL/hr over 30 Minutes Intravenous Every 48 hours 05/07/17 1114 05/16/17 2230   05/05/17 2000  anidulafungin (ERAXIS) 100 mg in sodium chloride 0.9 % 100 mL IVPB  Status:  Discontinued     100 mg 78 mL/hr over 100 Minutes Intravenous Every 24 hours 05/04/17 1908 05/14/17 1751   05/04/17 2000  anidulafungin (ERAXIS) 200 mg in sodium chloride 0.9 % 200 mL IVPB     200 mg 78 mL/hr over 200 Minutes Intravenous  Once 05/04/17 1908 05/05/17 0000   05/02/17 1200  DAPTOmycin (CUBICIN) 1,000 mg in sodium chloride 0.9 % IVPB  Status:  Discontinued     1,000 mg 240 mL/hr over 30 Minutes Intravenous Every 48 hours 05/02/17 1143 05/07/17 1114   04/24/2017 1900  ceFEPIme (MAXIPIME) 1 g in sodium chloride 0.9 % 100 mL IVPB     1 g 200 mL/hr over 30 Minutes Intravenous Daily after supper 04/14/2017 1833 05/16/17 1850   04/10/2017 1800  ceFEPime (MAXIPIME) IVPB  Status:  Discontinued    Note to Pharmacy:  Indication:  Spinal abscess Last Day of Therapy:  05/16/2017 Labs - Once weekly:  CBC/D and BMP, Labs - Every other week:  ESR and CRP     2 g Intravenous Every 12 hours 04/26/2017 1757 04/05/2017 1833   04/28/2017 1245  vancomycin (VANCOCIN) 2,000 mg in sodium  chloride 0.9 % 500 mL IVPB     2,000 mg 250 mL/hr over 120 Minutes Intravenous  Once 04/25/2017 1231 04/21/2017 1757   04/11/2017 1230  piperacillin-tazobactam (ZOSYN) IVPB 3.375 g     3.375  g 100 mL/hr over 30 Minutes Intravenous  Once 04/02/2017 1217 04/05/2017 1407   04/13/2017 1230  vancomycin (VANCOCIN) IVPB 1000 mg/200 mL premix  Status:  Discontinued     1,000 mg 200 mL/hr over 60 Minutes Intravenous  Once 04/05/2017 1217 04/09/2017 1231      Subjective:   Takeo Mcmichael seen and examined today.  Currently feeling tired, denies any current chest pain, shortness of breath.  No further bleeding per rectum.  Denies current abdominal pain, nausea or vomiting, dizziness or headache.  Objective:   Vitals:   05/25/17 0000 05/25/17 0400 05/25/17 0743 05/25/17 0912  BP: 135/65 130/81 119/77 123/78  Pulse: 85 72 66 77  Resp: 18 18 17   Temp: 98 F (36.7 C) 98 F (36.7 C) (!) 97.5 F (36.4 C)   TempSrc: Oral Axillary Axillary   SpO2: 97% 100% 100%   Weight:      Height:        Intake/Output Summary (Last 24 hours) at 05/25/2017 1045 Last data filed at 05/25/2017 0600 Gross per 24 hour  Intake 60 ml  Output 2000 ml  Net -1940 ml   Filed Weights   05/24/17 0021 05/24/17 0500  Weight: 130.2 kg (287 lb) 130.2 kg (287 lb 0.6 oz)   Exam  General: Well developed, chronically ill-appearing, no apparent distress  HEENT: NCAT, mucous membranes moist.   Neck: Supple  Cardiovascular: S1 S2 auscultated, RRR, no murmur  Respiratory: Clear to auscultation bilaterally with equal chest rise  Abdomen: Soft, obese, nontender, nondistended, + bowel sounds  Extremities: warm dry without cyanosis clubbing. LE edema improved  Neuro: AAOx3, no new changes  Psych depressed-flat, however appropriate  Data Reviewed: I have personally reviewed following labs and imaging studies  CBC: Recent Labs  Lab 05/22/17 2105 05/23/17 0755 05/23/17 2024 05/24/17 0432 05/25/17 0359  WBC 6.4 7.3 7.5 6.2 5.1  NEUTROABS 5.9  --   --  4.5  --   HGB 6.3* 9.2* 8.6* 8.3* 8.2*  HCT 19.2* 27.1* 25.0* 24.1* 24.4*  MCV 87.7 85.8 85.3 85.8 86.8  PLT 47* 36* 61* 46* 31*   Basic Metabolic  Panel: Recent Labs  Lab 05/20/17 0449 05/21/17 0711 05/22/17 0528 05/22/17 1227 05/24/17 0432 05/25/17 0359  NA 141 140 137 138 140 136  K 3.8 4.0 4.5 4.3 4.4 3.4*  CL 107 104 102 104 103 100*  CO2 24 25 25 24 18* 27  GLUCOSE 96 87 162* 172* 192* 81  BUN 43* 24* 33* 37* 53* 23*  CREATININE 4.10* 2.76* 3.34* 3.45* 4.21* 2.46*  CALCIUM 6.8* 6.9* 7.0* 6.8* 6.9* 6.7*  PHOS 3.5 2.9 3.8 4.1  --   --    GFR: Estimated Creatinine Clearance: 52.9 mL/min (A) (by C-G formula based on SCr of 2.46 mg/dL (H)). Liver Function Tests: Recent Labs  Lab 05/20/17 0449 05/21/17 0711 05/22/17 0528 05/22/17 1227  ALBUMIN 1.8* 1.8* 1.9* 1.7*   No results for input(s): LIPASE, AMYLASE in the last 168 hours. No results for input(s): AMMONIA in the last 168 hours. Coagulation Profile: Recent Labs  Lab 05/22/17 0846  INR 1.21   Cardiac Enzymes: Recent Labs  Lab 05/22/17 2117  TROPONINI 0.04*   BNP (last 3 results) No results for input(s): PROBNP   in the last 8760 hours. HbA1C: No results for input(s): HGBA1C in the last 72 hours. CBG: Recent Labs  Lab 05/24/17 0521 05/24/17 1703 05/24/17 2204 05/24/17 2304 05/25/17 0626  GLUCAP 175* 123* 80 69 91   Lipid Profile: No results for input(s): CHOL, HDL, LDLCALC, TRIG, CHOLHDL, LDLDIRECT in the last 72 hours. Thyroid Function Tests: No results for input(s): TSH, T4TOTAL, FREET4, T3FREE, THYROIDAB in the last 72 hours. Anemia Panel: Recent Labs    05/25/17 0359  FERRITIN 1,689*  TIBC 140*  IRON 26*   Urine analysis:    Component Value Date/Time   COLORURINE YELLOW 04/05/2017 0920   APPEARANCEUR TURBID (A) 05/02/2017 0920   LABSPEC 1.015 04/17/2017 0920   PHURINE 6.0 04/06/2017 0920   GLUCOSEU NEGATIVE 04/16/2017 0920   HGBUR LARGE (A) 04/17/2017 0920   BILIRUBINUR NEGATIVE 04/13/2017 0920   KETONESUR NEGATIVE 04/25/2017 0920   PROTEINUR 30 (A) 04/23/2017 0920   NITRITE NEGATIVE 05/02/2017 0920   LEUKOCYTESUR MODERATE  (A) 04/26/2017 0920   Sepsis Labs: _0 (procalcitonin:4,lacticidven:4)  ) Recent Results (from the past 240 hour(s))  MRSA PCR Screening     Status: None   Collection Time: 05/20/17 11:31 AM  Result Value Ref Range Status   MRSA by PCR NEGATIVE NEGATIVE Final    Comment:        The GeneXpert MRSA Assay (FDA approved for NASAL specimens only), is one component of a comprehensive MRSA colonization surveillance program. It is not intended to diagnose MRSA infection nor to guide or monitor treatment for MRSA infections. Performed at [facility], [place] [address] [place], [place] [address]       Radiology Studies: No results found.   Scheduled Meds: . amiodarone  200 mg Oral Daily  . collagenase   Topical Daily  . dexamethasone  3 mg Oral Q8H  . diltiazem  60 mg Oral Q8H  . feeding supplement (ENSURE ENLIVE)  237 mL Oral BID BM  . guaiFENesin  600 mg Oral BID  . insulin aspart  0-5 Units Subcutaneous QHS  . insulin aspart  0-9 Units Subcutaneous TID WC  . insulin aspart  2 Units Subcutaneous TID WC  . insulin glargine  12 Units Subcutaneous Daily  . metoprolol tartrate  12.5 mg Oral BID  . mirtazapine  30 mg Oral QHS   Continuous Infusions:    LOS: 24 days   Time Spent in minutes   30 minutes  Osiris Charles D.O. on 05/25/2017 at 10:45 AM  Between 7am to 7pm - Pager - [phone]  After 7pm go to [email or web address] - password [email or web address]  And look for the night coverage person covering for me after hours  [facility] Office  [phone]

## 2017-05-25 NOTE — Progress Notes (Signed)
Patient had bowl movent with no sign of bleeding. Patients sacral wound cleaned and redressed.

## 2017-05-25 NOTE — Progress Notes (Signed)
[facility] NEPHROLOGY PROGRESS NOTE  Assessment/ Plan: Pt is a 52 y.o. 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 fib, diabetes, morbid obesity, CKD with baseline creatinine around 1.6, admitted for acute kidney injury, obstructive uropathy and hyperkalemia requiring intermittent hemodialysis.  Assessment/Plan:  #Acute kidney injury due to ATN from supratherapeutic vancomycin.  Patient is now getting intermittent hemodialysis via temporary catheter. Last hemodialysis on 2/22, tolerated well.  He is now able to lie on the stretcher during dialysis.  Vein mapping was done.  I will monitor his urine output and rise of serum creatinine level.  If no improvement by tomorrow, plan for permanent catheter for hemodialysis.  Monitor urine output and watch for renal recovery.  Avoid nephrotoxins.d/w primary team.  #Neurogenic bladder/acute urinary retention secondary due to blockage of Foley catheter, underwent exchange by urology.  #Normocytic anemia: Iron saturation 19% with elevated ferritin level.  Globin 8.2.  Monitor CBC.  #Severe thrombocytopenia thought to be due to drug-related/non-immune mediated.  Evaluated by hematologist.  May need platelet transfusion before catheter placement.  Further decline in platelet today.  #Candidemia: Patient completed 10 days of anidulafungin.  #Proximal atrial fibrillation: On amiodarone, metoprolol and diltiazem.  No anticoagulation.  #Hypertension: Blood pressure acceptable.  Continue to monitor.  Subjective: Seen and examined at bedside.  Denies chest pain shortness of breath.  Discussed about the plan of monitoring kidney function and possible need of dialysis permanently if no improvement.  Objective Vital signs in last 24 hours: Vitals:   05/25/17 0000 05/25/17 0400 05/25/17 0743 05/25/17 0912  BP: 135/65 130/81 119/77 123/78  Pulse: 85 72 66 77  Resp: 18 18 17    Temp: 98 F (36.7 C) 98 F (36.7 C) (!)  97.5 F (36.4 C)   TempSrc: Oral Axillary Axillary   SpO2: 97% 100% 100%   Weight:      Height:       Weight change:   Intake/Output Summary (Last 24 hours) at 05/25/2017 1141 Last data filed at 05/25/2017 0600 Gross per 24 hour  Intake 60 ml  Output 2000 ml  Net -1940 ml       Labs: Basic Metabolic Panel: Recent Labs  Lab 05/21/17 0711 05/22/17 0528 05/22/17 1227 05/24/17 0432 05/25/17 0359  NA 140 137 138 140 136  K 4.0 4.5 4.3 4.4 3.4*  CL 104 102 104 103 100*  CO2 25 25 24  18* 27  GLUCOSE 87 162* 172* 192* 81  BUN 24* 33* 37* 53* 23*  CREATININE 2.76* 3.34* 3.45* 4.21* 2.46*  CALCIUM 6.9* 7.0* 6.8* 6.9* 6.7*  PHOS 2.9 3.8 4.1  --   --    Liver Function Tests: Recent Labs  Lab 05/21/17 0711 05/22/17 0528 05/22/17 1227  ALBUMIN 1.8* 1.9* 1.7*   No results for input(s): LIPASE, AMYLASE in the last 168 hours. No results for input(s): AMMONIA in the last 168 hours. CBC: Recent Labs  Lab 05/22/17 2105 05/23/17 0755 05/23/17 2024 05/24/17 0432 05/25/17 0359  WBC 6.4 7.3 7.5 6.2 5.1  NEUTROABS 5.9  --   --  4.5  --   HGB 6.3* 9.2* 8.6* 8.3* 8.2*  HCT 19.2* 27.1* 25.0* 24.1* 24.4*  MCV 87.7 85.8 85.3 85.8 86.8  PLT 47* 36* 61* 46* 31*   Cardiac Enzymes: Recent Labs  Lab 05/22/17 2117  TROPONINI 0.04*   CBG: Recent Labs  Lab 05/24/17 1703 05/24/17 2204 05/24/17 2304 05/25/17 0626 05/25/17 1127  GLUCAP 123* 80 69 91 94  Iron Studies:  Recent Labs    05/25/17 0359  IRON 26*  TIBC 140*  FERRITIN 1,689*   Studies/Results: No results found.  Medications: Infusions:   Scheduled Medications: . amiodarone  200 mg Oral Daily  . collagenase   Topical Daily  . dexamethasone  3 mg Oral Q8H  . diltiazem  60 mg Oral Q8H  . feeding supplement (ENSURE ENLIVE)  237 mL Oral BID BM  . guaiFENesin  600 mg Oral BID  . insulin aspart  0-5 Units Subcutaneous QHS  . insulin aspart  0-9 Units Subcutaneous TID WC  . insulin aspart  2 Units  Subcutaneous TID WC  . insulin glargine  12 Units Subcutaneous Daily  . metoprolol tartrate  12.5 mg Oral BID  . mirtazapine  30 mg Oral QHS  . potassium chloride  20 mEq Oral Once    have reviewed scheduled and prn medications.  Physical Exam: General: Not in distress, lying in bed comfortable Heart: Regular rate rhythm S1-S2 normal Lungs: Distant breath sound Abdomen: Soft, obese, nontender Extremities: Bandage applied in both legs Dialysis Access: Right IJ non-tunneled HD catheter.  Clean  Jayshun Galentine Prasad Lennard Capek 05/25/2017,11:41 AM  LOS: 24 days

## 2017-05-25 NOTE — Progress Notes (Signed)
Occupational Therapy Treatment Patient Details Name: Dylan [name] MRN: [id number] DOB: 1965-07-29 Today's Date: 05/25/2017    History of present illness Dylan [name] is a 52yo black male who comes [facility] on 3/30 with ABD pain and hypoglycemia, admitted with cervical abcess v hematoma s/p C five-seven surgery on . 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 He DC to SNF ~3WA with motor weakness C7 down, full sensation intact. He reports graual worsening of manipulative use with smartphone over those three weaks.     OT comments  Pt seen with PT.  Pt incontinent of stool and assisted with peri care.  He moved to EOB with total A +3, and sat EOB x ~8 mins, but had to return to supine due to orthostasis - BP 88/63 EOB, and 121/76 once returned to supine.  Pressure distribution cushion acquired for recliner.   Will continue to follow   Follow Up Recommendations  SNF;Supervision/Assistance - 24 hour    Equipment Recommendations  Wheelchair (measurements OT);Wheelchair cushion (measurements OT)    Recommendations for Other Services      Precautions / Restrictions Precautions Precautions: Fall;Cervical Precaution Comments: watch BP       Mobility Bed Mobility Overal bed mobility: Needs Assistance Bed Mobility: Rolling Rolling: Total assist;+2 for physical assistance;+2 for safety/equipment Sidelying to sit: Total assist;+2 for physical assistance;+2 for safety/equipment     Sit to sidelying: Total assist;+2 for physical assistance;+2 for safety/equipment General bed mobility comments: Pt assists minimally with rolling to Lt and to Rt - able to reach across body to hook UE with assist.  Required assist with all other aspects   Transfers                 General transfer comment: unable to attempt due to orthostasis.        Balance Overall balance assessment: Needs assistance Sitting-balance support: Feet supported;Bilateral upper extremity supported Sitting balance-Leahy Scale: Zero Sitting balance - Comments: Pt requires max - total A to sit EOB.  Requires max A for head/neck control as he maintains head/neck in flexed posture                                    ADL either performed or assessed with clinical judgement   ADL Overall ADL's : Needs assistance/impaired                             Toileting- Clothing Manipulation and Hygiene: Total assistance;+2 for physical assistance;Bed level Toileting - Clothing Manipulation Details (indicate cue type and reason): pt incontinent.  Assisted with peri care      Functional mobility during ADLs: Total assistance;+2 for physical assistance       Vision       Perception     Praxis      Cognition Arousal/Alertness: Awake/alert Behavior During Therapy: WFL for tasks assessed/performed Overall Cognitive Status: Within Functional Limits for tasks assessed                                          Exercises     Shoulder Instructions       General  Comments Pt sat EOB x 8 mins with c/o dizziness.  BP 88/63;  Pt moved to supine with BP 121/76.   Bil. LEs were wrapped with ace wraps upon therapist's enterance.  ACE wraps removed     Pertinent Vitals/ Pain       Pain Assessment: No/denies pain  Home Living                                          Prior Functioning/Environment              Frequency  Min 2X/week        Progress Toward Goals  OT Goals(current goals can now be found in the care plan section)  Progress towards OT goals: Progressing toward goals     Plan Discharge plan remains appropriate    Co-evaluation    PT/OT/SLP Co-Evaluation/Treatment: Yes Reason for Co-Treatment: Complexity of the patient's impairments (multi-system involvement);To address  functional/ADL transfers;For patient/therapist safety   OT goals addressed during session: ADL's and self-care;Strengthening/ROM      AM-PAC PT "6 Clicks" Daily Activity     Outcome Measure   Help from another person eating meals?: A Lot Help from another person taking care of personal grooming?: A Lot Help from another person toileting, which includes using toliet, bedpan, or urinal?: Total Help from another person bathing (including washing, rinsing, drying)?: A Lot Help from another person to put on and taking off regular upper body clothing?: Total Help from another person to put on and taking off regular lower body clothing?: Total 6 Click Score: 9    End of Session Equipment Utilized During Treatment: Oxygen  OT Visit Diagnosis: Muscle weakness (generalized) (M62.81);Other symptoms and signs involving the nervous system (R29.898)   Activity Tolerance Treatment limited secondary to medical complications (Comment)(orthostasis )   Patient Left in bed;with call bell/phone within reach   Nurse Communication Mobility status        Time: 1127-1205 OT Time Calculation (min): 38 min  Charges: OT General Charges $OT Visit: 1 Visit OT Treatments $Neuromuscular Re-education: 8-22 mins  Blades, OTR/L [phone]    [name] M 05/25/2017, 3:57 PM

## 2017-05-25 NOTE — Progress Notes (Signed)
Inpatient Diabetes Program Recommendations  AACE/ADA: New Consensus Statement on Inpatient Glycemic Control (2015)  Target Ranges:  Prepandial:   less than 140 mg/dL      Peak postprandial:   less than 180 mg/dL (1-2 hours)      Critically ill patients:  140 - 180 mg/dL   Lab Results  Component Value Date   GLUCAP 94 05/25/2017   HGBA1C 8.4 (H) 03/22/2017    Review of Glycemic ControlResults for SHONN, WITTEMAN (MRN [id number]) as of 05/25/2017 15:55  Ref. Range 05/24/2017 22:04 05/24/2017 23:04 05/25/2017 06:26 05/25/2017 11:27  Glucose-Capillary Latest Ref Range: 65 - 99 mg/dL 80 69 91 94    Inpatient Diabetes Program Recommendations:  Note blood sugars less than 100 mg/dL.  Please reduce Lantus to 6 units daily.   Thanks,  Beryl Meager, RN, BC-ADM Inpatient Diabetes Coordinator Pager [phone] (8a-5p)

## 2017-05-25 NOTE — Progress Notes (Signed)
Chart reviewed. Hgb is stable.  No report of further GI bleeding. Plts have declined further. Will need to monitor plts closely and replace if needed.   No etiology established for recent GI bleeding, though no further bleeding x 48 hrs.  Call GI if bleeding recurs, though platelets would need to be improved > 50K before colonoscopy would be performed.

## 2017-05-25 NOTE — Progress Notes (Signed)
Physical Therapy Treatment Patient Details Name: Dylan [name] MRN: [id number] DOB: [birth year] Today's Date: 05/25/2017    History of Present Illness Dylan [name] is a 52yo black male who comes to [facility] on 3/30 with ABD pain and hypoglycemia, admitted with cervical abcess v hematoma s/p C five-seven surgery on . 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 He DC to SNF ~3WA with motor weakness C7 down, full sensation intact. He reports graual worsening of manipulative use with smartphone over those three weaks.      PT Comments    Patient seen with OT. Pt required +3 assist to sit EOB. Pt became orthostatic and returned to supine. See BP in general comments. Pt will benefit from Prevalon boots for optimal positioning of bilat LE and for prevention of pressure wounds and will need to use pressure distribution cushion anytime OOB in chair. Continue to progress as tolerated.     Follow Up Recommendations  SNF;Supervision/Assistance - 24 hour     Equipment Recommendations  Other (comment)(PRAFO boots)    Recommendations for Other Services       Precautions / Restrictions Precautions Precautions: Fall;Cervical Precaution Comments: watch BP    Mobility  Bed Mobility Overal bed mobility: Needs Assistance Bed Mobility: Rolling Rolling: Total assist;+2 for physical assistance;+2 for safety/equipment Sidelying to sit: Total assist;+2 for physical assistance;+2 for safety/equipment     Sit to sidelying: Total assist;+2 for physical assistance;+2 for safety/equipment General bed mobility comments: Pt assists minimally with rolling to Lt and to Rt - able to reach across body to hook UE over bed rails to aid in maintaining sidelying with assistance; assist for all other aspects; sacral wound dressing changed in sidelying by RN  Transfers                 General transfer comment: unable to attempt due to orthostasis.     Ambulation/Gait                 Stairs             Wheelchair Mobility    Modified Rankin (Stroke Patients Only)       Balance Overall balance assessment: Needs assistance Sitting-balance support: Feet supported;Bilateral upper extremity supported Sitting balance-Leahy Scale: Zero Sitting balance - Comments: Pt requires max - total A to sit EOB.  Requires max A for head/neck control as he maintains head/neck in flexed posture                                     Cognition Arousal/Alertness: Awake/alert Behavior During Therapy: WFL for tasks assessed/performed Overall Cognitive Status: Within Functional Limits for tasks assessed                                        Exercises      General Comments General comments (skin integrity, edema, etc.): pt tolerated sitting EOB until c/o dizziness; BP 88/63 in sitting and 121/76 after pt back to supine; upon arrival pt's bilat LE wrapped in ACE wraps and ACE wraps removed end of session      Pertinent Vitals/Pain Pain Assessment: Faces Faces Pain Scale: Hurts little more Pain Location: neck Pain Descriptors /  Indicators: Grimacing Pain Intervention(s): Monitored during session;Repositioned    Home Living                      Prior Function            PT Goals (current goals can now be found in the care plan section) Progress towards PT goals: Progressing toward goals    Frequency    Min 2X/week      PT Plan Current plan remains appropriate    Co-evaluation PT/OT/SLP Co-Evaluation/Treatment: Yes Reason for Co-Treatment: Complexity of the patient's impairments (multi-system involvement);For patient/therapist safety;To address functional/ADL transfers PT goals addressed during session: Balance;Mobility/safety with mobility OT goals addressed during session: ADL's and  self-care;Strengthening/ROM      AM-PAC PT "6 Clicks" Daily Activity  Outcome Measure  Difficulty turning over in bed (including adjusting bedclothes, sheets and blankets)?: Unable Difficulty moving from lying on back to sitting on the side of the bed? : Unable Difficulty sitting down on and standing up from a chair with arms (e.g., wheelchair, bedside commode, etc,.)?: Unable Help needed moving to and from a bed to chair (including a wheelchair)?: Total Help needed walking in hospital room?: Total Help needed climbing 3-5 steps with a railing? : Total 6 Click Score: 6    End of Session Equipment Utilized During Treatment: Oxygen Activity Tolerance: Treatment limited secondary to medical complications (Comment);Other (comment)(orthostatic in sitting) Patient left: in bed;with call bell/phone within reach;with SCD's reapplied Nurse Communication: Mobility status;Need for lift equipment PT Visit Diagnosis: Other symptoms and signs involving the nervous system (R29.898);Difficulty in walking, not elsewhere classified (R26.2);Muscle weakness (generalized) (M62.81)     Time: 1127-1205 PT Time Calculation (min) (ACUTE ONLY): 38 min  Charges:  $Therapeutic Activity: 23-37 mins                    G Codes:       Erline Levine, PTA Pager: [phone]     Carolynne Edouard 05/25/2017, 4:12 PM

## 2017-05-26 LAB — RENAL FUNCTION PANEL
ANION GAP: 9 (ref 5–15)
Albumin: 2 g/dL — ABNORMAL LOW (ref 3.5–5.0)
BUN: 36 mg/dL — ABNORMAL HIGH (ref 6–20)
CALCIUM: 6.9 mg/dL — AB (ref 8.9–10.3)
CO2: 28 mmol/L (ref 22–32)
Chloride: 98 mmol/L — ABNORMAL LOW (ref 101–111)
Creatinine, Ser: 3.31 mg/dL — ABNORMAL HIGH (ref 0.61–1.24)
GFR calc Af Amer: 23 mL/min — ABNORMAL LOW (ref >60)
GFR calc non Af Amer: 20 mL/min — ABNORMAL LOW (ref >60)
GLUCOSE: 148 mg/dL — AB (ref 65–99)
Phosphorus: 3.8 mg/dL (ref 2.5–4.6)
Potassium: 4.3 mmol/L (ref 3.5–5.1)
SODIUM: 135 mmol/L (ref 135–145)

## 2017-05-26 LAB — CBC
HCT: 24.1 % — ABNORMAL LOW (ref 39.0–52.0)
Hemoglobin: 8.2 g/dL — ABNORMAL LOW (ref 13.0–17.0)
MCH: 29.2 pg (ref 26.0–34.0)
MCHC: 34 g/dL (ref 30.0–36.0)
MCV: 85.8 fL (ref 78.0–100.0)
PLATELETS: 25 10*3/uL — AB (ref 150–400)
RBC: 2.81 MIL/uL — AB (ref 4.22–5.81)
RDW: 15.4 % (ref 11.5–15.5)
WBC: 7.1 10*3/uL (ref 4.0–10.5)

## 2017-05-26 LAB — TYPE AND SCREEN
ABO/RH(D): B POS
Antibody Screen: NEGATIVE
UNIT DIVISION: 0
UNIT DIVISION: 0
Unit division: 0

## 2017-05-26 LAB — BPAM RBC
BLOOD PRODUCT EXPIRATION DATE: 201905152359
Blood Product Expiration Date: 201905132359
Blood Product Expiration Date: 201905132359
ISSUE DATE / TIME: 201904202352
ISSUE DATE / TIME: 201904210333
UNIT TYPE AND RH: 7300
Unit Type and Rh: 7300
Unit Type and Rh: 7300

## 2017-05-26 LAB — GLUCOSE, CAPILLARY
GLUCOSE-CAPILLARY: 155 mg/dL — AB (ref 65–99)
GLUCOSE-CAPILLARY: 233 mg/dL — AB (ref 65–99)
Glucose-Capillary: 114 mg/dL — ABNORMAL HIGH (ref 65–99)
Glucose-Capillary: 271 mg/dL — ABNORMAL HIGH (ref 65–99)

## 2017-05-26 NOTE — Progress Notes (Addendum)
[facility] PROGRESS NOTE  Assessment/ Plan: Pt is a 52 y.o. 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 fib, diabetes, morbid obesity, CKD with baseline creatinine around 1.6, admitted for acute kidney injury, obstructive uropathy and hyperkalemia requiring intermittent hemodialysis.  Assessment/Plan:  #Acute kidney injury due to ATN from supratherapeutic vancomycin.  Patient is now getting intermittent hemodialysis via temporary catheter. Last hemodialysis on 4/22, tolerated well.  He is now able to lie on the stretcher during dialysis.  Vein mapping was done.  I will monitor his urine output and rise of serum creatinine level.  Serum creatinine level elevated today with urine output of 810 cc.  Repeat lab in a.m.  He likely needs permanent hemodialysis.  Monitor urine output and watch for renal recovery.  Avoid nephrotoxins.d/w primary team.  #Neurogenic bladder/acute urinary retention secondary due to blockage of Foley catheter, underwent exchange by urology.  #Normocytic anemia: Iron saturation 19% with elevated ferritin level of 1689.  Hb 8.2.  Monitor CBC.   #Severe thrombocytopenia thought to be due to drug-related/non-immune mediated.  Evaluated by hematologist.  May need platelet transfusion before catheter placement.  Further decline in platelet today.  No sign of active bleeding.  #Candidemia: Patient completed 10 days of anidulafungin.  #Proximal atrial fibrillation: On amiodarone, metoprolol and diltiazem.  No anticoagulation.  #Hypertension: Blood pressure acceptable.  Continue to monitor.  Subjective: Seen and examined at bedside.  Denies headache, chills, nausea vomiting chest pain shortness of breath. Objective Vital signs in last 24 hours: Vitals:   05/25/17 2338 05/26/17 0426 05/26/17 0739 05/26/17 1213  BP: 127/79 123/76 138/84 124/80  Pulse: 81 70 75 72  Resp: 18 18 17 18   Temp: 98.5 F (36.9 C) 98.9 F (37.2  C) 98.3 F (36.8 C) 98.4 F (36.9 C)  TempSrc: Oral Oral Axillary Axillary  SpO2: 100% 100% 91% 93%  Weight:  132.5 kg (292 lb 1.8 oz)    Height:       Weight change:   Intake/Output Summary (Last 24 hours) at 05/26/2017 1307 Last data filed at 05/26/2017 1213 Gross per 24 hour  Intake 120 ml  Output -  Net 120 ml       Labs: Basic Metabolic Panel: Recent Labs  Lab 05/22/17 1227  05/25/17 0359 05/25/17 1130 05/26/17 0826  NA 138   < > 136 135 135  K 4.3   < > 3.4* 4.0 4.3  CL 104   < > 100* 100* 98*  CO2 24   < > 27 25 28   GLUCOSE 172*   < > 81 154* 148*  BUN 37*   < > 23* 29* 36*  CREATININE 3.45*   < > 2.46* 2.82* 3.31*  CALCIUM 6.8*   < > 6.7* 6.8* 6.9*  PHOS 4.1  --   --  3.6 3.8   < > = values in this interval not displayed.   Liver Function Tests: Recent Labs  Lab 05/22/17 1227 05/25/17 1130 05/26/17 0826  ALBUMIN 1.7* 2.0* 2.0*   No results for input(s): LIPASE, AMYLASE in the last 168 hours. No results for input(s): AMMONIA in the last 168 hours. CBC: Recent Labs  Lab 05/22/17 2105 05/23/17 0755 05/23/17 2024 05/24/17 0432 05/25/17 0359 05/26/17 0826  WBC 6.4 7.3 7.5 6.2 5.1 7.1  NEUTROABS 5.9  --   --  4.5  --   --   HGB 6.3* 9.2* 8.6* 8.3* 8.2* 8.2*  HCT 19.2* 27.1* 25.0* 24.1*  24.4* 24.1*  MCV 87.7 85.8 85.3 85.8 86.8 85.8  PLT 47* 36* 61* 46* 31* 25*   Cardiac Enzymes: Recent Labs  Lab 05/22/17 2117  TROPONINI 0.04*   CBG: Recent Labs  Lab 05/25/17 1127 05/25/17 1703 05/25/17 2110 05/26/17 0604 05/26/17 1203  GLUCAP 94 115* 201* 155* 114*    Iron Studies:  Recent Labs    05/25/17 0359  IRON 26*  TIBC 140*  FERRITIN 1,689*   Studies/Results: No results found.  Medications: Infusions:   Scheduled Medications: . amiodarone  200 mg Oral Daily  . collagenase   Topical Daily  . dexamethasone  3 mg Oral Q8H  . diltiazem  60 mg Oral Q8H  . feeding supplement (ENSURE ENLIVE)  237 mL Oral BID BM  . guaiFENesin   600 mg Oral BID  . insulin aspart  0-5 Units Subcutaneous QHS  . insulin aspart  0-9 Units Subcutaneous TID WC  . insulin aspart  2 Units Subcutaneous TID WC  . insulin glargine  12 Units Subcutaneous Daily  . metoprolol tartrate  12.5 mg Oral BID  . mirtazapine  30 mg Oral QHS    have reviewed scheduled and prn medications.  Physical Exam: General: Not in distress, lying on bed flat Heart: Regular rate rhythm S1-S2 normal. Lungs: Distant breath sound, no wheezing Abdomen: Soft, obese, nontender Extremities: Bandage applied in both legs Dialysis Access: Right IJ non-tunneled HD catheter.  Clean  Saisha Hogue Jaynie Collins 05/26/2017,1:07 PM  LOS: 25 days

## 2017-05-26 NOTE — Progress Notes (Signed)
Nutrition Follow-up  DOCUMENTATION CODES:   Obesity unspecified  INTERVENTION:  Ensure Enlive po BID, each supplement provides 350 kcal and 20 grams of protein  NUTRITION DIAGNOSIS:   Inadequate oral intake related to poor appetite as evidenced by per patient/family report. -ongoing, being addressed with ONS and Diet  GOAL:   Patient will meet greater than or equal to 90% of their needs  -progressing  MONITOR:   PO intake, Supplement acceptance, Weight trends, I & O's, Labs  REASON FOR ASSESSMENT:   Low Braden    ASSESSMENT:   Pt with PMH of HTN, DM, MS, recent quadriplegia, s/p decompression spinal surgery on C5, C6 and C7 (03/25/17) developing multiple issues post op including new Afib and prevertebral hematoma at surgical sight. Pt presents with abdominal pain found AKI   Continues to tolerate diet. Had a birthday yesterday, lots of sweets and desserts in room. He did not have much of an appetite this morning, but was very appreciative of [place] switching his diet to 2 grams sodium.   Per Nephro, likely needs permanent HD. Meal completion average 43% - seems to fluctuate from day to day. Continues to drink ensure. Electrolytes WNL. Completed physical exam again. Patient exhibits atrophy of muscle secondary to quadriplegia. Weight has fluctuated between 272-295 pounds since 05/12/2017  Labs reviewed:  BUN/Creatinine 36/3.31 Medications reviewed and include:  Dexamethasone, Insulin, remeron  NUTRITION - FOCUSED PHYSICAL EXAM:    Most Recent Value  Orbital Region  No depletion  Upper Arm Region  No depletion  Thoracic and Lumbar Region  No depletion  Buccal Region  No depletion  Temple Region  No depletion  Clavicle Bone Region  Severe depletion  Clavicle and Acromion Bone Region  Moderate depletion  Scapular Bone Region  Unable to assess  Dorsal Hand  Mild depletion  Patellar Region  Moderate depletion  Anterior Thigh Region  Moderate depletion  Posterior Calf  Region  Moderate depletion  Edema (RD Assessment)  None       Diet Order:  Diet 2 gram sodium Room service appropriate? Yes; Fluid consistency: Thin; Fluid restriction: 1200 mL Fluid  EDUCATION NEEDS:   Not appropriate for education at this time  Skin:  Skin Assessment: Skin Integrity Issues: Skin Integrity Issues:: Unstageable Stage II: buttocks Unstageable: to buttocks  Last BM:  05/25/2017  Height:   Ht Readings from Last 1 Encounters:  05/05/17 6\' 4"  (1.93 m)    Weight:   Wt Readings from Last 1 Encounters:  05/26/17 292 lb 1.8 oz (132.5 kg)    Ideal Body Weight:  91.8 kg  BMI:  Body mass index is 35.56 kg/m.  Estimated Nutritional Needs:   Kcal:  2200-2400  Protein:  120-135 grams  Fluid:  >/= 2.2 L/d  Dionne Ano. Malavika Lira, MS, RD LDN Inpatient Clinical Dietitian Pager [phone]

## 2017-05-26 NOTE — Progress Notes (Addendum)
PROGRESS NOTE    Dylan [name]  WUJ:[id number] DOB: September 24, 1965 DOA: 04/06/2017 PCP: Patient, No Pcp Per   Brief Narrative: Patient is a 52 year old male with past medical history significant for recent quadriplegia, multiple sclerosis, decompression of the spinal abscess, spinal stenosis who has been  admitted for the management of acute kidney injury.  He was recently admitted to neurosurgery on 04/12/2017 and had surgery(corpectomy/decompression surgery) on 03/25/17 for cord compression due to spinal stenosis at C6 ,C5 and C7.  Postoperative course was completed with atrial fibrillation, started on Eliquis but discontinued due to spinal hematoma. Also epidural clot versus abscess was suspected on his follow up  C-spine MRI. Because of the high risk patient decided not to repeat exploratory surgery.  He was also started on IV antibiotics due to complicated UTI ,possible cervical abscess and was discharged to skilled nursing facility on 04/09/2017.   His Foley catheter was not draining at the skilled nursing facility so he was sent to the emergency department.  Found to have acute kidney injury. Urology was also consulted, flushed his Foley with removal of foreign body yeilding 2.5 L of urine. Currently patient has been managed for acute kidney injury thought to be secondary to possible vancomycin related.  Nephrology following.  Patient hospital course is also complicated with sepsis, Candida glabrata fungal anemia, progressive oliguric renal failure, hyperkalemia , thrombocytopenia.  Assessment & Plan:   Principal Problem:   MDD (major depressive disorder), single episode, moderate (HCC) Active Problems:   Uncontrolled diabetes mellitus type 2 without complications (HCC)   Hypertension   Morbid obesity (HCC)   Paroxysmal atrial fibrillation (HCC)   Multiple sclerosis exacerbation (HCC)   Pressure injury of skin   Hyperkalemia   AKI (acute kidney injury) (HCC)   Hematoma   Insomnia  Adjustment reaction with anxiety and depression   Quadriplegia (HCC)   History of fusion of cervical spine   Postoperative wound infection   Coronary artery disease   Atherosclerotic peripheral vascular disease (HCC)   Candidemia (HCC)   Goals of care, counseling/discussion   Palliative care by specialist  Acute renal failure: Thought to be secondary to obstructive uropathy versus vancomycin-induced renal injury.  Nephrology is following.  He has been started on dialysis since 05/07/2017.  He has a temporary dialysis catheter . Plan for permanent dialysis catheter placement.  Vein mapping has been ordered for possible starting on long-term dialysis. There was also concern that patient might need stretcher dialysis which would be possible only out of state.But on last dialysis he was able to sit. Creatinine today is 3.3.  His last dialysis was on 05/24/2017 and he was able to be dialyzed in  a chair.  Severe thrombocytopenia: Thought to be secondary to drug related.  Anidulafungin, daptomycin and cefepime stopped.  Protonix also stopped.  HIT negative.  Patient does not receive heparin with dialysis.  Hematology was also consulted .He has received several units of platelets transfusion. Hematology feels that this is not likely immune medicine thrombocytopenia given patient response to transfusion. Platelets count today is 25000  .  Continue to monitor CBC.  GI bleed/acute blood loss anemia/acute on chronic anemia: Protonix stopped due to thrombocytopenia.  Today hemoglobin is 8.2.  Status post 2 units of PRBC.  No further bleeding noted for last 72 hours. GI on board.  Recommending continue close monitoring.  Recommended to reconsult if rebleeding.  Patient would need more than 50,000 platelets for colonoscopy.  Elevated troponin: Mild.  Did not trend  up.  Patient denies any chest pain.  Likely supply demand ischemia  Quadriplegia/cervical abscess versus cervical hematoma/infected cervical  hematoma: Patient completed 6 weeks of antibiotics including 4 weeks of vancomycin and cefepime followed by 2 weeks of daptomycin and cefepime.  Course completed on 05/16/2017. Neurosurgery was following before.  ID was following  Neurogenic bladder: Patient was discharged with Foley catheter on 04/09/2017 in the context of new quadriplegia. Foley catheter exhanged on admission.    Candidemia: Patient had 10 days of  Anidulafungin which has been discontinued due to thrombocytopenia. Ophthalmology also consulted, no evidence of intraocular fungal infection.  Echocardiogram did not show any vegetation.  Diabetes type 2: Continue Lantus, sliding scale insulin.  We will continue monitor blood sugars.  Paroxysmal A. fib: No anticoagulation given his current thrombocytopenia.  Continue amiodarone, metoprolol and Cardizem.  Multiple sclerosis: Recently diagnosed and he was given 5 days of high-dose steroids with improvement.  Neurology consulted.  Recommended to follow as an outpatient.  Patient has braces on bilateral  Forearms to prevent spasticity.  Adjustment disorder/depression: Continue mirtazapine.  Psychiatry was following.  Recommended trazodone.  Hyponatremia/hypokalemia: Currently stable  Unstageable pressure injury on bilateral buttocks: Continue supportive care.  Continue wound care  Nocturnal hypoxia: No history of OSA or the use of CPAP.  Continue supplemental oxygen as needed/CPAP at night.  Goals of care: Palliative care was consulted.  Remains full code    DVT prophylaxis: SCD Code Status: Full Family Communication: Discussed with [name] on phone Disposition Plan: Skilled nursing facility after improvement in thrombocytopenia and final decision regarding dialysis   Consultants: Nephrology, neurology, psychiatry, infectious disease, ophthalmology, GI  Procedures: Echocardiogram, upper external vein mapping  Antimicrobials: None at the moment  Subjective: Patient  seen and examined the bedside this morning.  Remains comfortable.  No new issues/events.  Denies any complaints.  Continues to remain bedbound.  Objective: Vitals:   05/25/17 2002 05/25/17 2338 05/26/17 0426 05/26/17 0739  BP: 129/86 127/79 123/76 138/84  Pulse: 75 81 70 75  Resp: 18 18 18 17   Temp: 98.2 F (36.8 C) 98.5 F (36.9 C) 98.9 F (37.2 C) 98.3 F (36.8 C)  TempSrc: Oral Oral Oral Axillary  SpO2: 100% 100% 100% 91%  Weight:   132.5 kg (292 lb 1.8 oz)   Height:       No intake or output data in the 24 hours ending 05/26/17 0915 Filed Weights   05/24/17 0021 05/24/17 0500 05/26/17 0426  Weight: 130.2 kg (287 lb) 130.2 kg (287 lb 0.6 oz) 132.5 kg (292 lb 1.8 oz)    Examination:  General exam: Appears calm and comfortable ,Not in distress, morbidly obese, tonically ill HEENT:PERRL,Oral mucosa moist, Ear/Nose normal on gross exam Respiratory system: Bilateral decreased air entry in the bases Cardiovascular system: S1 & S2 heard, RRR. No JVD, murmurs, rubs, gallops or clicks. No pedal edema. Gastrointestinal system: Abdomen is nondistended, soft and nontender. No organomegaly or masses felt. Normal bowel sounds heard. Central nervous system: Alert and oriented.  Quadriplegia  extremities: Trace edema, no clubbing ,no cyanosis, distal peripheral pulses palpable.  Brace on the bilateral forearms Skin: No rashes, lesions ,no icterus ,no pallor, unstageable pressure ulcer on the buttocks Psychiatry: Judgement and insight appear normal. Mood & affect appropriate.     Data Reviewed: I have personally reviewed following labs and imaging studies  CBC: Recent Labs  Lab 05/22/17 2105 05/23/17 0755 05/23/17 2024 05/24/17 0432 05/25/17 0359  WBC 6.4 7.3 7.5 6.2 5.1  NEUTROABS 5.9  --   --  4.5  --   HGB 6.3* 9.2* 8.6* 8.3* 8.2*  HCT 19.2* 27.1* 25.0* 24.1* 24.4*  MCV 87.7 85.8 85.3 85.8 86.8  PLT 47* 36* 61* 46* 31*   Basic Metabolic Panel: Recent Labs  Lab  05/20/17 0449 05/21/17 0711 05/22/17 0528 05/22/17 1227 05/24/17 0432 05/25/17 0359 05/25/17 1130  NA 141 140 137 138 140 136 135  K 3.8 4.0 4.5 4.3 4.4 3.4* 4.0  CL 107 104 102 104 103 100* 100*  CO2 24 25 25 24  18* 27 25  GLUCOSE 96 87 162* 172* 192* 81 154*  BUN 43* 24* 33* 37* 53* 23* 29*  CREATININE 4.10* 2.76* 3.34* 3.45* 4.21* 2.46* 2.82*  CALCIUM 6.8* 6.9* 7.0* 6.8* 6.9* 6.7* 6.8*  PHOS 3.5 2.9 3.8 4.1  --   --  3.6   GFR: Estimated Creatinine Clearance: 46.6 mL/min (A) (by C-G formula based on SCr of 2.82 mg/dL (H)). Liver Function Tests: Recent Labs  Lab 05/20/17 0449 05/21/17 0711 05/22/17 0528 05/22/17 1227 05/25/17 1130  ALBUMIN 1.8* 1.8* 1.9* 1.7* 2.0*   No results for input(s): LIPASE, AMYLASE in the last 168 hours. No results for input(s): AMMONIA in the last 168 hours. Coagulation Profile: Recent Labs  Lab 05/22/17 0846  INR 1.21   Cardiac Enzymes: Recent Labs  Lab 05/22/17 2117  TROPONINI 0.04*   BNP (last 3 results) No results for input(s): PROBNP in the last 8760 hours. HbA1C: No results for input(s): HGBA1C in the last 72 hours. CBG: Recent Labs  Lab 05/25/17 0626 05/25/17 1127 05/25/17 1703 05/25/17 2110 05/26/17 0604  GLUCAP 91 94 115* 201* 155*   Lipid Profile: No results for input(s): CHOL, HDL, LDLCALC, TRIG, CHOLHDL, LDLDIRECT in the last 72 hours. Thyroid Function Tests: No results for input(s): TSH, T4TOTAL, FREET4, T3FREE, THYROIDAB in the last 72 hours. Anemia Panel: Recent Labs    05/25/17 0359  FERRITIN 1,689*  TIBC 140*  IRON 26*   Sepsis Labs: No results for input(s): PROCALCITON, LATICACIDVEN in the last 168 hours.  Recent Results (from the past 240 hour(s))  MRSA PCR Screening     Status: None   Collection Time: 05/20/17 11:31 AM  Result Value Ref Range Status   MRSA by PCR NEGATIVE NEGATIVE Final    Comment:        The GeneXpert MRSA Assay (FDA approved for NASAL specimens only), is one component of  a comprehensive MRSA colonization surveillance program. It is not intended to diagnose MRSA infection nor to guide or monitor treatment for MRSA infections. Performed at [facility], [address] [place], [place] [address]          Radiology Studies: No results found.      Scheduled Meds: . amiodarone  200 mg Oral Daily  . collagenase   Topical Daily  . dexamethasone  3 mg Oral Q8H  . diltiazem  60 mg Oral Q8H  . feeding supplement (ENSURE ENLIVE)  237 mL Oral BID BM  . guaiFENesin  600 mg Oral BID  . insulin aspart  0-5 Units Subcutaneous QHS  . insulin aspart  0-9 Units Subcutaneous TID WC  . insulin aspart  2 Units Subcutaneous TID WC  . insulin glargine  12 Units Subcutaneous Daily  . metoprolol tartrate  12.5 mg Oral BID  . mirtazapine  30 mg Oral QHS   Continuous Infusions:   LOS: 25 days    Time spent: 35 mins.More  than 50% of that time was spent in counseling and/or coordination of care.      Burnadette Pop, MD [facility] Pager [phone]  If 7PM-7AM, please contact night-coverage [email or web address] Password [email or web address] 05/26/2017, 9:15 AM

## 2017-05-27 LAB — CBC WITH DIFFERENTIAL/PLATELET
Basophils Absolute: 0 K/uL (ref 0.0–0.1)
Basophils Relative: 0 %
Eosinophils Absolute: 0 K/uL (ref 0.0–0.7)
Eosinophils Relative: 0 %
HCT: 25.2 % — ABNORMAL LOW (ref 39.0–52.0)
Hemoglobin: 8.5 g/dL — ABNORMAL LOW (ref 13.0–17.0)
Lymphocytes Relative: 14 %
Lymphs Abs: 1.1 K/uL (ref 0.7–4.0)
MCH: 29.1 pg (ref 26.0–34.0)
MCHC: 33.7 g/dL (ref 30.0–36.0)
MCV: 86.3 fL (ref 78.0–100.0)
Monocytes Absolute: 0.2 K/uL (ref 0.1–1.0)
Monocytes Relative: 3 %
Neutro Abs: 6.6 K/uL (ref 1.7–7.7)
Neutrophils Relative %: 83 %
Platelets: 33 K/uL — ABNORMAL LOW (ref 150–400)
RBC: 2.92 MIL/uL — ABNORMAL LOW (ref 4.22–5.81)
RDW: 15.5 % (ref 11.5–15.5)
WBC: 7.9 K/uL (ref 4.0–10.5)

## 2017-05-27 LAB — HEPATIC FUNCTION PANEL
ALK PHOS: 130 U/L — AB (ref 38–126)
ALT: 53 U/L (ref 17–63)
AST: 25 U/L (ref 15–41)
Albumin: 2.1 g/dL — ABNORMAL LOW (ref 3.5–5.0)
BILIRUBIN TOTAL: 0.8 mg/dL (ref 0.3–1.2)
Bilirubin, Direct: 0.1 mg/dL (ref 0.1–0.5)
Indirect Bilirubin: 0.7 mg/dL (ref 0.3–0.9)
Total Protein: 4.6 g/dL — ABNORMAL LOW (ref 6.5–8.1)

## 2017-05-27 LAB — GLUCOSE, CAPILLARY
GLUCOSE-CAPILLARY: 243 mg/dL — AB (ref 65–99)
GLUCOSE-CAPILLARY: 254 mg/dL — AB (ref 65–99)
Glucose-Capillary: 133 mg/dL — ABNORMAL HIGH (ref 65–99)
Glucose-Capillary: 136 mg/dL — ABNORMAL HIGH (ref 65–99)

## 2017-05-27 LAB — BASIC METABOLIC PANEL WITH GFR
Anion gap: 10 (ref 5–15)
BUN: 47 mg/dL — ABNORMAL HIGH (ref 6–20)
CO2: 26 mmol/L (ref 22–32)
Calcium: 7 mg/dL — ABNORMAL LOW (ref 8.9–10.3)
Chloride: 100 mmol/L — ABNORMAL LOW (ref 101–111)
Creatinine, Ser: 3.95 mg/dL — ABNORMAL HIGH (ref 0.61–1.24)
GFR calc Af Amer: 19 mL/min — ABNORMAL LOW
GFR calc non Af Amer: 16 mL/min — ABNORMAL LOW
Glucose, Bld: 146 mg/dL — ABNORMAL HIGH (ref 65–99)
Potassium: 4.5 mmol/L (ref 3.5–5.1)
Sodium: 136 mmol/L (ref 135–145)

## 2017-05-27 LAB — MAGNESIUM: Magnesium: 1.7 mg/dL (ref 1.7–2.4)

## 2017-05-27 LAB — PHOSPHORUS: Phosphorus: 3.8 mg/dL (ref 2.5–4.6)

## 2017-05-27 MED ORDER — SODIUM CHLORIDE 0.9 % IV SOLN
1.5000 g | INTRAVENOUS | Status: AC
  Filled 2017-05-27: qty 1.5

## 2017-05-27 MED ORDER — SODIUM CHLORIDE 0.9 % IV SOLN
Freq: Once | INTRAVENOUS | Status: AC
  Administered 2017-05-31 (×2): via INTRAVENOUS

## 2017-05-27 NOTE — Consult Note (Addendum)
Hospital Consult    Reason for Consult:  Permanent Access  Requesting Physician:  Carolin Sicks MRN #:  [id number]  History of Present Illness: This is a 52 y.o. male with quadriplegia, MS, decompression of spinal abscess who was admitted with abdominal pain and hypoglycemia.  He underwent neurosurgery at C6, C5, AND C7 and post operative course was complicated by afib and was placed on Eliquis.  He then developed an epidural clot vs abscess that was seen on C spine on MRI.  This was not re-explored due to high risk.  H ewas placed on IV abx and discharged to SNF.  He was supposed to receive IV vanc and cefepime until 05/06/17 but the vanc was discontinued before this due to increasing creatinine.   [facility] has been consulted for [facility] placement and permanent dialysis access.  He does have severe thrombocytopenia with a platelet count of 33k & is receiving 2 platelet phoresis packs.  The pt recently had a large volume of hematochezia a few ago and required transfusion.  GI was consulted.  At this time, they do not recommend colonoscopy as there has not been any further bleeding.    He does have unstageable left buttock pressure injury, sacral pressure injury and right buttock pressure injury.  There was a large amount of bright green drainage with odor to the sacrum and buttocks today per [facility] and that the appearance is consistent with pseudomonas.  He is on amiodarone, beta blocker and CCB for his afib.  He does have a neurogenic bladder/acute urinary retention due to blockage of foley catheter and underwent exchange per urology.  Past Medical History:  Diagnosis Date  . 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 Diabetes mellitus without complication ([place])   . Hypertension   . MS (congenital mitral stenosis) 2019  . Persistent atrial fibrillation with rapid ventricular response ([place]) 03/27/2017    Past Surgical History:  Procedure Laterality Date  . ANTERIOR CERVICAL DECOMP/DISCECTOMY FUSION N/A 03/25/2017   Procedure: ANTERIOR CERVICAL DECOMPRESSION/DISCECTOMY FUSION CERVICAL FIVE- CERVICAL SIX, CERVICAL SIX- CERVICAL SEVEN, CORPECTOMY;  Surgeon: Consuella Lose, MD;  Location: [place];  Service: Neurosurgery;  Laterality: N/A;  . IR FLUORO GUIDE CV LINE RIGHT  05/07/2017  . IR US GUIDE VASC ACCESS RIGHT  05/07/2017  . NOSE SURGERY     AS A CHILD    No Known Allergies  Prior to Admission medications   Medication Sig Start Date End Date Taking? Authorizing Provider  acetaminophen (TYLENOL) 325 MG tablet Take 650 mg by mouth every 4 (four) hours as needed. 04/09/17  Yes [provider]  amiodarone (PACERONE) 200 MG tablet Take 1 tablet (200 mg total) by mouth daily. 04/10/17  Yes Cherene Altes, MD  b complex vitamins tablet Take 1 tablet by mouth daily.   Yes [provider]  bisacodyl (DULCOLAX) 5 MG EC tablet Take 1 tablet (5 mg total) by mouth daily as needed for moderate constipation. 04/09/17  Yes Cherene Altes, MD  ceFEPime (MAXIPIME) IVPB Inject 2 g into the vein every 12 (twelve) hours. Indication:  Spinal abscess Last Day of Therapy:  05/16/2017 Labs - Once weekly:  CBC/D and BMP, Labs - Every other week:  ESR and CRP 04/09/17  Yes Cherene Altes, MD  collagenase (SANTYL) ointment Apply 1 application topically daily as needed (wound care).    Yes [provider]  dexamethasone (DECADRON) 1.5 MG tablet Take 2 tablets (3 mg total) by mouth every 8 (eight) hours. 04/09/17  Yes  Cherene Altes, MD  diltiazem (CARDIZEM) 30 MG tablet Take 1 tablet (30 mg total) by mouth every 8 (eight) hours. 04/09/17  Yes Cherene Altes, MD  GLUCERNA [facility]) LIQD Glucerna 1.5 - Give 240 ml three times daily 04/09/17  Yes [provider]  insulin aspart (NOVOLOG FLEXPEN) 100 UNIT/ML FlexPen Inject as per sliding scale subcutaneously before meals: 0 - 120 = 0 Units 121 - 150 = 2 units 151 - 200 = 4 units 201 - 250 = 6 units 251 - 300 = 8 units 301 - 350 = 10  units 351 - 400 = 12 units 401 - 450 = 14 units Notify Provider is less than 60 or greater than 450   Yes [provider]  insulin aspart (NOVOLOG FLEXPEN) 100 UNIT/ML FlexPen Inject 10 Units into the skin 3 (three) times daily with meals.  04/20/17  Yes [provider]  Insulin Glargine (LANTUS SOLOSTAR) 100 UNIT/ML Solostar Pen Inject 70 Units into the skin at bedtime.  04/22/17  Yes [provider]  LORazepam (ATIVAN) 0.5 MG tablet Take 1 tablet (0.5 mg total) by mouth every 6 (six) hours as needed for anxiety. X 2 weeks 04/28/17  Yes Gerlene Fee, NP  Melatonin 3 MG TABS Take 3 mg by mouth at bedtime.   Yes [provider]  metFORMIN (GLUMETZA) 1000 MG (MOD) 24 hr tablet Take 1,000 mg by mouth daily with breakfast.   Yes [provider]  methocarbamol (ROBAXIN) 500 MG tablet Take 1 tablet (500 mg total) by mouth every 6 (six) hours as needed for muscle spasms. 04/09/17  Yes Cherene Altes, MD  Multiple Vitamin (MULTIVITAMIN) tablet Take 1 tablet by mouth daily. 04/17/17  Yes [provider]  ondansetron (ZOFRAN) 4 MG tablet Take 1 tablet (4 mg total) by mouth every 6 (six) hours as needed for nausea. 04/09/17  Yes Cherene Altes, MD  pantoprazole (PROTONIX) 40 MG tablet Take 1 tablet (40 mg total) by mouth daily. 04/10/17  Yes Cherene Altes, MD  promethazine (PHENERGAN) 25 MG/ML injection Inject 25 mg into the vein every 8 (eight) hours as needed for nausea or vomiting.   Yes [provider]  senna-docusate (SENOKOT-S) 8.6-50 MG tablet Take 1 tablet by mouth at bedtime as needed for mild constipation. 04/09/17  Yes Cherene Altes, MD  sertraline (ZOLOFT) 50 MG tablet Take 50 mg by mouth daily.  04/29/17  Yes [provider]  sodium chloride 0.9 % injection Inject 10 mLs into the vein 2 (two) times daily. Before and after antibiotics   Yes [provider]  traZODone (DESYREL) 50 MG tablet Take 50 mg by mouth  at bedtime as needed for sleep.   Yes [provider]  zolpidem (AMBIEN) 10 MG tablet Take 10 mg by mouth at bedtime as needed for sleep. 04/28/17  Yes [provider]  vancomycin IVPB Inject 500 mg into the vein every 12 (twelve) hours. Indication:  Spinal abscess Last Day of Therapy:  05/16/2017 Labs - Sunday/Monday:  CBC/D, BMP, and vancomycin trough. Labs - Thursday:  BMP and vancomycin trough Labs - Every other week:  ESR and CRP Patient not taking: Reported on 04/03/2017 04/09/17   Cherene Altes, MD    Social History   Socioeconomic History  . Marital status: Single    Spouse name: Not on file  . Number of children: Not on file  . Years of education: Not on file  . Highest education  level: Not on file  Occupational History  . Not on file  Social Needs  . Financial resource strain: Not on file  . Food insecurity:    Worry: Not on file    Inability: Not on file  . Transportation needs:    Medical: Not on file    Non-medical: Not on file  Tobacco Use  . Smoking status: Never Smoker  . Smokeless tobacco: Never Used  Substance and Sexual Activity  . Alcohol use: No    Frequency: Never  . Drug use: No  . Sexual activity: Not on file  Lifestyle  . Physical activity:    Days per week: Not on file    Minutes per session: Not on file  . Stress: Not on file  Relationships  . Social connections:    Talks on phone: Not on file    Gets together: Not on file    Attends religious service: Not on file    Active member of club or organization: Not on file    Attends meetings of clubs or organizations: Not on file    Relationship status: Not on file  . Intimate partner violence:    Fear of current or ex partner: Not on file    Emotionally abused: Not on file    Physically abused: Not on file    Forced sexual activity: Not on file  Other Topics Concern  . Not on file  Social History Narrative  . Not on file     Family History  Problem Relation Age of  Onset  . Prostate cancer Father     ROS: _0  Positive   _1  Negative   _2  All sytems reviewed and are negative  Cardiac: _3  afib  Vascular: _4  pain in legs while walking _5  pain in legs at rest _6  pain in legs at night _7  non-healing ulcers _8  hx of DVT _9  swelling in legs  Pulmonary: _10  productive cough _11  asthma/wheezing _12  home O2  Neurologic: _13  quadriplegic - see HPI _14  MS _15  spinal stenosis  Hematologic: _16  low platelets  Endocrine:   _17  diabetes _18  thyroid disease  GI _19  blood in stool  GU: _20  CKD/renal failure _21  HD--_22  M/W/F or _23  T/T/S _24  burning with urination _25  blood in urine  Psychiatric: _26  anxiety _27  depression  Musculoskeletal: _28  arthritis _29  joint pain  Integumentary: _30  rashes _31  ulcers  Constitutional: _32  fever _33  chills   Physical Examination  Vitals:   05/27/17 1156 05/27/17 1216  BP: (!) 158/95 140/85  Pulse: 67 71  Resp: 18 18  Temp: 97.8 F (36.6 C)   SpO2: 93% 90%   Body mass index is 35.56 kg/m.  General:  WDWN in NAD Gait: Not observed HENT: WNL, normocephalic Pulmonary: normal non-labored breathing Cardiac: regular Abdomen:  soft, NT/ND, no masses Skin: without rashes Vascular Exam/Pulses:  Right Left  Radial 2+ (normal) 2+ (normal)  Brachial 2+ (normal) 2+ (normal)  DP 2+ (normal) 2+ (normal)  PT Unable to palpate  Unable to palpate    Extremities: without ischemic changes, without Gangrene , without cellulitis; without open wounds;  +swelling BLE Musculoskeletal: no muscle wasting or atrophy  Neurologic: A&O X 3;  No focal weakness or paresthesias are detected; speech is fluent/normal Psychiatric:  The pt has Normal affect.  CBC    Component Value Date/Time   WBC 7.9 05/27/2017 0520   RBC 2.92 (L) 05/27/2017 0520   HGB 8.5 (L) 05/27/2017 0520   HCT 25.2 (L) 05/27/2017  0520   PLT 33 (L) 05/27/2017 0520   MCV 86.3 05/27/2017 0520   MCH 29.1 05/27/2017 0520   MCHC 33.7  05/27/2017 0520   RDW 15.5 05/27/2017 0520   LYMPHSABS 1.1 05/27/2017 0520   MONOABS 0.2 05/27/2017 0520   EOSABS 0.0 05/27/2017 0520   BASOSABS 0.0 05/27/2017 0520    BMET    Component Value Date/Time   NA 136 05/27/2017 0520   NA 133 (A) 04/30/2017   K 4.5 05/27/2017 0520   CL 100 (L) 05/27/2017 0520   CO2 26 05/27/2017 0520   GLUCOSE 146 (H) 05/27/2017 0520   BUN 47 (H) 05/27/2017 0520   BUN 83 (A) 04/30/2017   CREATININE 3.95 (H) 05/27/2017 0520   CALCIUM 7.0 (L) 05/27/2017 0520   GFRNONAA 16 (L) 05/27/2017 0520   GFRAA 19 (L) 05/27/2017 0520    COAGS: Lab Results  Component Value Date   INR 1.21 05/22/2017   INR 1.27 05/12/2017   INR 1.37 04/11/2017     Non-Invasive Vascular Imaging:   Vein mapping 06/11/30: Right cephalic                         Right Basilic (DIAMETER MM) Ax            1.77                                                  M upper   2.23                                     3.01 D upper   2.09                                     3.63 AC            4.09                                    3.13 Below AC 3.09                                    2.23 FA             1.86 WR            6.71  Left Basilic M upper 2.45 D upper 2.28 AC         3.72 Below AC 2.39    Statin:  No. Beta Blocker:  Yes.   Aspirin:  No. ACEI:  No. ARB:  No. CCB use:  Yes Other antiplatelets/anticoagulants:  No.    ASSESSMENT/PLAN: This is a 52 y.o. male with acute kidney injury due to ATN from supra therapeutic vancomycin in need of permanent dialysis access and tunneled dialysis catheter.    -will plan for right arm fistula [place] with TDC placement if thrombocytopenia has improved.  Pt is in agreement. -npo after MN Sunday   Samantha Rhyne, PA-C [facility] [phone]   I have interviewed and examined patient  with PA and agree with assessment and plan above. Plan for right arm avf with tdc.   Swathi Dauphin C. Donzetta Matters, MD [facility] [place] Office: [phone] Pager: [phone]

## 2017-05-27 NOTE — Progress Notes (Signed)
Physical Therapy Treatment Patient Details Name: WALDO DAMIAN MRN: [id number] DOB: 1966-01-12 Today's Date: 05/27/2017    History of Present Illness Marteze "[name]" Camilli is a 52yo black male who comes to [facility] on 3/30 with ABD pain and hypoglycemia, admitted with cervical abcess v hematoma s/p C five-seven surgery on . 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 He DC to SNF ~3WA with motor weakness C7 down, full sensation intact. He reports graual worsening of manipulative use with smartphone over those three weaks.      PT Comments    Pt received in bed and agreeable to participation in therapy. PROM performed BLE. +2 total assist required for bed mobility. Pt sat EOB x 5 minutes with +2 total assist. Pt returned to supine at end of session. Bed pan used to scoot up in bed. Pillows utilized to postion BUE/LEs for comfort and decreased pressure points. Goals reviewed and remain appropriate. PT to continue per POC.    Follow Up Recommendations  SNF;Supervision/Assistance - 24 hour     Equipment Recommendations  Other (comment)(PRAFO boots)    Recommendations for Other Services       Precautions / Restrictions Precautions Precautions: Fall;Cervical;Other (comment) Precaution Comments: watch BP    Mobility  Bed Mobility Overal bed mobility: Needs Assistance Bed Mobility: Rolling;Supine to Sit;Sit to Supine Rolling: Total assist;+2 for physical assistance   Supine to sit: Total assist;+2 for physical assistance Sit to supine: Total assist;+2 for physical assistance   General bed mobility comments: use of bed pad to helicopter to EOB.  Transfers                    Ambulation/Gait                 Stairs             Wheelchair Mobility    Modified Rankin (Stroke Patients Only)       Balance    Sitting-balance support: Feet supported;Bilateral upper extremity supported Sitting balance-Leahy Scale: Zero Sitting balance - Comments: Total assist to maintain sitting balance EOB x 5 minutes. Assist to maintain upright posture, including head/neck. Pt tends to maintains flexed posture.                                    Cognition Arousal/Alertness: Awake/alert Behavior During Therapy: WFL for tasks assessed/performed Overall Cognitive Status: Within Functional Limits for tasks assessed                                        Exercises      General Comments General comments (skin integrity, edema, etc.): VSS throughout. No change in BP with positional change.       Pertinent Vitals/Pain Pain Assessment: Faces Faces Pain Scale: Hurts little more Pain Location: neck Pain Descriptors / Indicators: Grimacing;Moaning Pain Intervention(s): Repositioned;Monitored during session;Limited activity within patient's tolerance    Home Living                      Prior Function            PT Goals (current goals can now be found in the care plan section) Acute  Rehab PT Goals Patient Stated Goal: not stated PT Goal Formulation: With patient Time For Goal Achievement: 06/09/17 Potential to Achieve Goals: Fair Additional Goals Additional Goal #1: Pt will demonstrate improved tolerace to BUE HEP and ability to perfrom 75% independently. Additional Goal #2: Pt will verbalize simple NECK A/ROM exercises to improve stabilization of cervical spine during ADL care. Progress towards PT goals: Progressing toward goals    Frequency    Min 2X/week      PT Plan Current plan remains appropriate    Co-evaluation              AM-PAC PT "6 Clicks" Daily Activity  Outcome Measure  Difficulty turning over in bed (including adjusting bedclothes, sheets and blankets)?: Unable Difficulty moving from lying on back to sitting on the side of the  bed? : Unable Difficulty sitting down on and standing up from a chair with arms (e.g., wheelchair, bedside commode, etc,.)?: Unable Help needed moving to and from a bed to chair (including a wheelchair)?: Total Help needed walking in hospital room?: Total Help needed climbing 3-5 steps with a railing? : Total 6 Click Score: 6    End of Session   Activity Tolerance: Patient tolerated treatment well Patient left: in bed;with call bell/phone within reach Nurse Communication: Mobility status;Need for lift equipment PT Visit Diagnosis: Other symptoms and signs involving the nervous system (R29.898);Difficulty in walking, not elsewhere classified (R26.2);Muscle weakness (generalized) (M62.81)     Time: 1111-1140 PT Time Calculation (min) (ACUTE ONLY): 29 min  Charges:  $Therapeutic Activity: 23-37 mins                    G Codes:       Aida Raider, PT  Office # [phone] Pager [phone]    Ilda Foil 05/27/2017, 12:59 PM

## 2017-05-27 NOTE — Progress Notes (Signed)
PROGRESS NOTE    Dylan [name]  XWR:[id number] DOB: 17-Oct-1965 DOA: 04/14/2017 PCP: Patient, No Pcp Per   Brief Narrative: Patient is a 52 year old male with past medical history significant for recent quadriplegia, multiple sclerosis, decompression of the spinal abscess, spinal stenosis who has been  admitted for the management of acute kidney injury.  He was recently admitted to neurosurgery on 04/12/2017 and had surgery(corpectomy/decompression surgery) on 03/25/17 for cord compression due to spinal stenosis at C6 ,C5 and C7.  Postoperative course was completed with atrial fibrillation, started on Eliquis but discontinued due to spinal hematoma. Also epidural clot versus abscess was suspected on his follow up  C-spine MRI. Because of the high risk patient decided not to repeat exploratory surgery.  He was also started on IV antibiotics due to complicated UTI ,possible cervical abscess and was discharged to skilled nursing facility on 04/09/2017.   His Foley catheter was not draining at the skilled nursing facility so he was sent to the emergency department.  Found to have acute kidney injury. Urology was also consulted, flushed his Foley with removal of foreign body yeilding 2.5 L of urine. Currently patient has been managed for acute kidney injury thought to be secondary to possible vancomycin related.  Nephrology following.  Patient hospital course is also complicated with sepsis, Candida glabrata fungal anemia, progressive oliguric renal failure, hyperkalemia , thrombocytopenia.  Assessment & Plan:   Principal Problem:   MDD (major depressive disorder), single episode, moderate (HCC) Active Problems:   Uncontrolled diabetes mellitus type 2 without complications (HCC)   Hypertension   Morbid obesity (HCC)   Paroxysmal atrial fibrillation (HCC)   Multiple sclerosis exacerbation (HCC)   Pressure injury of skin   Hyperkalemia   AKI (acute kidney injury) (HCC)   Hematoma   Insomnia  Adjustment reaction with anxiety and depression   Quadriplegia (HCC)   History of fusion of cervical spine   Postoperative wound infection   Coronary artery disease   Atherosclerotic peripheral vascular disease (HCC)   Candidemia (HCC)   Goals of care, counseling/discussion   Palliative care by specialist  Acute renal failure: Thought to be secondary to obstructive uropathy versus vancomycin-induced renal injury.  Nephrology is following.  He has been started on dialysis since 05/07/2017.  He has a temporary dialysis catheter . Plan for permanent dialysis catheter placement.  Vascular surgery consulted by nephrology for the placement of tunneled catheter and AV fistula creation.  Vein mapping done. There was also concern that patient might need stretcher dialysis which [place] be possible [place] of [place].But on last dialysis he was able to sit. Creatinine today is 3.9.  His last dialysis was on 05/24/2017 and he was able to be dialyzed in  a chair.  Severe thrombocytopenia: Thought to be secondary to drug related.  Anidulafungin, daptomycin and cefepime stopped.  Protonix also stopped.  HIT negative.  Patient does not receive heparin with dialysis.  Hematology was also consulted .He has received several units of platelets transfusion. Hematology feels that this is not likely immune medicine thrombocytopenia given patient response to transfusion. Platelets count today is 33000  .  He will be transfused with  2 units of platelets today.  Continue to monitor CBC.  GI bleed/acute blood loss anemia/acute on chronic anemia: Protonix stopped due to thrombocytopenia.  Status post 2 units of PRBC.  No further bleeding noted for last few days. GI on board.  Recommending continue close monitoring.  Recommended to reconsult if rebleeding.  Patient would  need more than 50,000 platelets for colonoscopy.  Elevated troponin: Mild.  Did not trend up.  Patient denies any chest pain.  Likely supply demand  ischemia  Quadriplegia/cervical abscess versus cervical hematoma/infected cervical hematoma: Patient completed 6 weeks of antibiotics including 4 weeks of vancomycin and cefepime followed by 2 weeks of daptomycin and cefepime.  Course completed on 05/16/2017. Neurosurgery was following before.  ID was following  Neurogenic bladder: Patient was discharged with Foley catheter on 04/09/2017 in the context of new quadriplegia. Foley catheter exhanged on admission.    Candidemia: Patient had 10 days of  Anidulafungin which has been discontinued due to thrombocytopenia. Ophthalmology also consulted, no evidence of intraocular fungal infection.  Echocardiogram did not show any vegetation.  Diabetes type 2: Continue Lantus, sliding scale insulin.  We will continue monitor blood sugars.  Paroxysmal A. fib: No anticoagulation given his current thrombocytopenia.  Continue amiodarone, metoprolol and Cardizem.  Multiple sclerosis: Recently diagnosed and he was given 5 days of high-dose steroids with improvement.  Neurology consulted.  Recommended to follow as an outpatient.  Patient has braces on bilateral  Forearms to prevent spasticity.  Adjustment disorder/depression: Continue mirtazapine.  Psychiatry was following.  Recommended trazodone.  Hyponatremia/hypokalemia: Currently stable  Unstageable pressure injury on bilateral buttocks: Continue supportive care.  Continue wound care  Nocturnal hypoxia: No history of OSA or the use of CPAP.  Continue supplemental oxygen as needed/CPAP at night.  Goals of care: Palliative care was consulted.  Remains full code    DVT prophylaxis: SCD Code Status: Full Family Communication: Discussed with [name] on phone Disposition Plan: Skilled nursing facility after improvement in thrombocytopenia and final decision regarding dialysis   Consultants: Nephrology, neurology, psychiatry, infectious disease, ophthalmology, GI  Procedures: Echocardiogram,  upper external vein mapping  Antimicrobials: None at the moment  Subjective: Patient seen and examined at bedside this morning.  Remains comfortable.  Discussed the plan about insertion of tunneled catheter  and starting dialysis permanently.  No new events/issues.  He will be transfused with  2 units of platelets today. Objective: Vitals:   05/27/17 0920 05/27/17 0940 05/27/17 1156 05/27/17 1216  BP: (!) 152/90 (!) 152/88 (!) 158/95 140/85  Pulse: 67 72 67 71  Resp: 19 18 18 18   Temp: 97.9 F (36.6 C) 97.9 F (36.6 C) 97.8 F (36.6 C)   TempSrc: Oral Oral Oral   SpO2: 100% 98% 93% 90%  Weight:      Height:        Intake/Output Summary (Last 24 hours) at 05/27/2017 1251 Last data filed at 05/27/2017 1156 Gross per 24 hour  Intake 746 ml  Output 375 ml  Net 371 ml   Filed Weights   05/24/17 0021 05/24/17 0500 05/26/17 0426  Weight: 130.2 kg (287 lb) 130.2 kg (287 lb 0.6 oz) 132.5 kg (292 lb 1.8 oz)    Examination:  General exam: Appears calm and comfortable ,Not in distress, morbidly obese HEENT:PERRL,Oral mucosa moist, Ear/Nose normal on gross exam, temporary dialysis catheter on the right neck Respiratory system: Bilateral decreased air entry on  the bases Cardiovascular system: S1 & S2 heard, RRR. No JVD, murmurs, rubs, gallops or clicks. Gastrointestinal system: Abdomen is nondistended, soft and nontender. No organomegaly or masses felt. Normal bowel sounds heard. Central nervous system: Alert and oriented.  Quadriplegia Extremities: No edema, no clubbing ,no cyanosis, distal peripheral pulses palpable,  Braces on the bilateral upper extremities to prevent spasticity Skin: unstageable pressure ulcer on the buttocks MSK: Normal  muscle bulk,tone ,power Psychiatry: Judgement and insight appear normal. Mood & affect appropriate.       Data Reviewed: I have personally reviewed following labs and imaging studies  CBC: Recent Labs  Lab 05/22/17 2105  05/23/17 2024  05/24/17 0432 05/25/17 0359 05/26/17 0826 05/27/17 0520  WBC 6.4   < > 7.5 6.2 5.1 7.1 7.9  NEUTROABS 5.9  --   --  4.5  --   --  6.6  HGB 6.3*   < > 8.6* 8.3* 8.2* 8.2* 8.5*  HCT 19.2*   < > 25.0* 24.1* 24.4* 24.1* 25.2*  MCV 87.7   < > 85.3 85.8 86.8 85.8 86.3  PLT 47*   < > 61* 46* 31* 25* 33*   < > = values in this interval not displayed.   Basic Metabolic Panel: Recent Labs  Lab 05/22/17 0528 05/22/17 1227 05/24/17 0432 05/25/17 0359 05/25/17 1130 05/26/17 0826 05/27/17 0520  NA 137 138 140 136 135 135 136  K 4.5 4.3 4.4 3.4* 4.0 4.3 4.5  CL 102 104 103 100* 100* 98* 100*  CO2 25 24 18* 27 25 28 26   GLUCOSE 162* 172* 192* 81 154* 148* 146*  BUN 33* 37* 53* 23* 29* 36* 47*  CREATININE 3.34* 3.45* 4.21* 2.46* 2.82* 3.31* 3.95*  CALCIUM 7.0* 6.8* 6.9* 6.7* 6.8* 6.9* 7.0*  MG  --   --   --   --   --   --  1.7  PHOS 3.8 4.1  --   --  3.6 3.8 3.8   GFR: Estimated Creatinine Clearance: 33.3 mL/min (A) (by C-G formula based on SCr of 3.95 mg/dL (H)). Liver Function Tests: Recent Labs  Lab 05/22/17 0528 05/22/17 1227 05/25/17 1130 05/26/17 0826 05/27/17 0520  AST  --   --   --   --  25  ALT  --   --   --   --  53  ALKPHOS  --   --   --   --  130*  BILITOT  --   --   --   --  0.8  PROT  --   --   --   --  4.6*  ALBUMIN 1.9* 1.7* 2.0* 2.0* 2.1*   No results for input(s): LIPASE, AMYLASE in the last 168 hours. No results for input(s): AMMONIA in the last 168 hours. Coagulation Profile: Recent Labs  Lab 05/22/17 0846  INR 1.21   Cardiac Enzymes: Recent Labs  Lab 05/22/17 2117  TROPONINI 0.04*   BNP (last 3 results) No results for input(s): PROBNP in the last 8760 hours. HbA1C: No results for input(s): HGBA1C in the last 72 hours. CBG: Recent Labs  Lab 05/26/17 1203 05/26/17 1649 05/26/17 2151 05/27/17 0627 05/27/17 1201  GLUCAP 114* 271* 233* 133* 136*   Lipid Profile: No results for input(s): CHOL, HDL, LDLCALC, TRIG, CHOLHDL, LDLDIRECT in the  last 72 hours. Thyroid Function Tests: No results for input(s): TSH, T4TOTAL, FREET4, T3FREE, THYROIDAB in the last 72 hours. Anemia Panel: Recent Labs    05/25/17 0359  FERRITIN 1,689*  TIBC 140*  IRON 26*   Sepsis Labs: No results for input(s): PROCALCITON, LATICACIDVEN in the last 168 hours.  Recent Results (from the past 240 hour(s))  MRSA PCR Screening     Status: None   Collection Time: 05/20/17 11:31 AM  Result Value Ref Range Status   MRSA by PCR NEGATIVE NEGATIVE Final    Comment:  The GeneXpert MRSA Assay (FDA approved for NASAL specimens only), is one component of a comprehensive MRSA colonization surveillance program. It is not intended to diagnose MRSA infection nor to guide or monitor treatment for MRSA infections. Performed at [facility], [address], [place], [place] [address]          Radiology Studies: No results found.      Scheduled Meds: . amiodarone  200 mg Oral Daily  . collagenase   Topical Daily  . dexamethasone  3 mg Oral Q8H  . diltiazem  60 mg Oral Q8H  . feeding supplement (ENSURE ENLIVE)  237 mL Oral BID BM  . guaiFENesin  600 mg Oral BID  . insulin aspart  0-5 Units Subcutaneous QHS  . insulin aspart  0-9 Units Subcutaneous TID WC  . insulin aspart  2 Units Subcutaneous TID WC  . insulin glargine  12 Units Subcutaneous Daily  . metoprolol tartrate  12.5 mg Oral BID  . mirtazapine  30 mg Oral QHS   Continuous Infusions: . sodium chloride       LOS: 26 days    Time spent: 25 mins.More than 50% of that time was spent in counseling and/or coordination of care.      Burnadette Pop, MD [facility] Pager [phone]  If 7PM-7AM, please contact night-coverage [email or web address] Password [facility] 05/27/2017, 12:51 PM

## 2017-05-27 NOTE — Progress Notes (Signed)
Patient was bladder scanned around  0530 and only had 176 ml of urine, will let on coming shift know, he is not feeling any discomfort.

## 2017-05-27 NOTE — Consult Note (Addendum)
WOC Nurse wound consult note Reason for Consult: Pt is familiar to [facility] team from a recent admission, refer to consult note on 4/9.  Wounds have declined since previous assessment. Pt has multiple systemic factors which can impair healing. Wound type: Left buttock with unstageable pressure injury; 11X7cm, 100% yellow slough, mod amt green drainage with odor. Middle area involving sacrum stage 3 pressure injury; 8X4X.2cm, 100% red and moist, small amt bleeding, mod amt green drainage with odor Right buttock with  stage 3 pressure injury; 9X9X.2cm, 100% red and moist, small amt bleeding, mod amt green drainage with odor Pressure Injury POA: Yes Periwound: Loose peeling skin surrounding the wounds, red and moist and macerated, appearance is consistent with moisture associated skin damage. Dressing procedure/placement/frequency: Xeroform to promote drying and healing to sacrum and right buttock.  Santyl for enzymatic debridement of nonviable tissue to left buttock.  Pt is on a low airloss bed to reduce pressure. Discussed plan of care with patient. Pt has large amt bright green drainage with odor to sacrum and buttocks wounds; appearance is consistent with pseudomonas.  Please order systemic antibiotic coverage.  Please re-consult if further assistance is needed.  Thank-you,  Cammie Mcgee MSN, RN, CWOCN, [place], CNS [phone]

## 2017-05-27 NOTE — Progress Notes (Signed)
Pt sacral dsg changed at 1800 as ordered; pt will benefit from a WOC. Pt didn't have any urine output between 12noon to 1800; pt bladder scanned for 420; pt later bladder scanned at 2130 for  After no voiding. Pt I&O cathed for . Pt resting comfortably in bed with call light within reach.

## 2017-05-27 NOTE — Progress Notes (Signed)
Occupational Therapy Treatment Patient Details Name: Dylan [name] MRN: [id number] DOB: December 28, 1965 Today's Date: 05/27/2017    History of present illness Jake "[name]" Culp is a 52yo black male who comes to [facility] on 3/30 with ABD pain and hypoglycemia, admitted with cervical abcess v hematoma s/p C five-seven surgery on . 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 He DC to SNF ~3WA with motor weakness C7 down, full sensation intact. He reports graual worsening of manipulative use with smartphone over those three weaks.     OT comments  Focus of session on BUE A/AA/PROM. Pt developing L elbow flexion tightness/contracture. Educated pt on importance of him learning how to direct his care regarding positioning, ROM, and use of splints to help reduce likelihood of development of contractures, improve functional positioning, reduce pressure areas and increase his independence. Pt coughing more today - nsg alerted. Pt with flatter affect today but worked well with therapy. Will continue to follow acutely.   Follow Up Recommendations  SNF;Supervision/Assistance - 24 hour    Equipment Recommendations  Wheelchair (measurements OT);Wheelchair cushion (measurements OT)    Recommendations for Other Services      Precautions / Restrictions Precautions Precautions: Fall;Cervical;Other (comment) Precaution Comments: watch BP       Mobility Bed Mobility                  Transfers                      Balance                                           ADL either performed or assessed with clinical judgement   ADL Overall ADL's : Needs assistance/impaired                                       General ADL Comments: Pt states he has been trying to use his u-cuff for feeding but it has been  "difficult".      Vision       Perception     Praxis      Cognition Arousal/Alertness: Awake/alert Behavior During Therapy: WFL for tasks assessed/performed Overall Cognitive Status: Within Functional Limits for tasks assessed                                          Exercises Exercises: General Upper Extremity General Exercises - Upper Extremity Shoulder Flexion: AAROM;Both;15 reps;Supine Shoulder Extension: AAROM;Both;15 reps;Supine Shoulder ABduction: AAROM;Both;15 reps;Supine Shoulder ADduction: AAROM;Both;15 reps;Supine Shoulder Horizontal ABduction: Both;15 reps;Supine Shoulder Horizontal ADduction: Both;15 reps;Supine Elbow Flexion: Both;Strengthening;20 reps;Supine Elbow Extension: AAROM;20 reps;Both;Supine Wrist Flexion: AROM;15 reps;Supine;Both Wrist Extension: Left;PROM;10 reps;Right;Strengthening;15 reps Other Exercises Other Exercises: gentle passive extension L elbow - tight flexor tendons; developing elbow flexion contracture.  Other Exercises: incentive spirometer x 10 Other Exercises: hand to mouth pattern RUE x 10 Other Exercises: BLE positioned out of "frog leg" position to reduce amoutn of extreanl rotation adn pressure on lateral malleoli.    Shoulder Instructions       General Comments  Pt with @ 20 loss of ROM L elbow. Able to achieve extension to @ 10 after gentle passive stretching.  RUE - active wrist extension and elbow extension @ 2/5; pronation/supination 3/5 elbow flex 3+/5 LUE - wrist ext 0/5;supination3/5' pronation 2/5; elbow ext 0/5; flexion 2/5 Will benefit from B resting hand splints for night use     Pertinent Vitals/ Pain       Pain Assessment: Faces Faces Pain Scale: Hurts little more Pain Location: with L elbow extension Pain Descriptors / Indicators: Grimacing;Guarding Pain Intervention(s): Limited activity within patient's tolerance;Repositioned  Home Living                                           Prior Functioning/Environment              Frequency  Min 2X/week        Progress Toward Goals  OT Goals(current goals can now be found in the care plan section)  Progress towards OT goals: Progressing toward goals  Acute Rehab OT Goals Patient Stated Goal: to feed myself OT Goal Formulation: With patient Time For Goal Achievement: 06/10/17 Potential to Achieve Goals: Good ADL Goals Additional ADL Goal #2: Pt will independently direct caregiver on donning/doffing WHO and purpose of splints.  Plan Discharge plan remains appropriate    Co-evaluation                 AM-PAC PT "6 Clicks" Daily Activity     Outcome Measure   Help from another person eating meals?: A Lot Help from another person taking care of personal grooming?: A Lot Help from another person toileting, which includes using toliet, bedpan, or urinal?: Total Help from another person bathing (including washing, rinsing, drying)?: A Lot Help from another person to put on and taking off regular upper body clothing?: Total Help from another person to put on and taking off regular lower body clothing?: Total 6 Click Score: 9    End of Session    OT Visit Diagnosis: Muscle weakness (generalized) (M62.81);Other symptoms and signs involving the nervous system (R29.898)   Activity Tolerance Patient tolerated treatment well   Patient Left in bed;with call bell/phone within reach   Nurse Communication Mobility status        Time: 5329-9242 OT Time Calculation (min): 43 min  Charges: OT General Charges $OT Visit: 1 Visit OT Treatments $Self Care/Home Management : 8-22 mins $Therapeutic Exercise: 23-37 mins  [facility], OT/L  [phone] 05/27/2017   Donnika Kucher,HILLARY 05/27/2017, 5:15 PM

## 2017-05-27 NOTE — Progress Notes (Signed)
[name] NEPHROLOGY PROGRESS NOTE  Assessment/ Plan: Pt is a 52 y.o. 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 fib, diabetes, morbid obesity, CKD with baseline creatinine around 1.6, admitted for acute kidney injury, obstructive uropathy and hyperkalemia requiring intermittent hemodialysis.  Assessment/Plan:  #Acute kidney injury due to ATN from supratherapeutic vancomycin.  Patient is now getting intermittent hemodialysis via temporary catheter. Last hemodialysis on 4/22, tolerated well.  He is now able to lie on the stretcher during dialysis.  Vein mapping was done.  Patient with very minimal urine output and serum creatinine level continues to increase.  He has no sign of renal recovery.  I have discussed with the patient today regarding worsening kidney function and need for permanent dialysis.  He agreed with the plan.  I consulted vascular team for the placement of tunneled catheter and AV fistula creation.  AV vein mapping was already done.  Transfusing 2 units of platelet.  Still continue to monitor for renal recovery.  No plan for dialysis today.  Discussed with primary team as well.  #Neurogenic bladder/acute urinary retention secondary due to blockage of Foley catheter, underwent exchange by urology.  #Normocytic anemia: Iron saturation 19% with elevated ferritin level of 1689.  Hb 8.2.  Monitor CBC.   #Severe thrombocytopenia thought to be due to drug-related/non-immune mediated.  Evaluated by hematologist.  Ordered 2 units of platelet today.  No sign of active bleed.   #Candidemia: Patient completed 10 days of anidulafungin.  #Proximal atrial fibrillation: On amiodarone, metoprolol and diltiazem.  No anticoagulation.  #Hypertension: Blood pressure acceptable.  Continue to monitor.  Subjective: Seen and examined at bedside.  Denied headache, dizziness, nausea vomiting chest pain shortness of breath.  Objective Vital signs in last 24  hours: Vitals:   05/27/17 0454 05/27/17 0833 05/27/17 0920 05/27/17 0940  BP: 136/83 (!) 152/90 (!) 152/90 (!) 152/88  Pulse: 71 76 67 72  Resp: 20 18 19 18   Temp: 98.6 F (37 C) 97.7 F (36.5 C) 97.9 F (36.6 C) 97.9 F (36.6 C)  TempSrc: Oral Oral Oral Oral  SpO2: 97% 95% 100% 98%  Weight:      Height:       Weight change:   Intake/Output Summary (Last 24 hours) at 05/27/2017 1022 Last data filed at 05/27/2017 0837 Gross per 24 hour  Intake 140 ml  Output 375 ml  Net -235 ml       Labs: Basic Metabolic Panel: Recent Labs  Lab 05/25/17 1130 05/26/17 0826 05/27/17 0520  NA 135 135 136  K 4.0 4.3 4.5  CL 100* 98* 100*  CO2 25 28 26   GLUCOSE 154* 148* 146*  BUN 29* 36* 47*  CREATININE 2.82* 3.31* 3.95*  CALCIUM 6.8* 6.9* 7.0*  PHOS 3.6 3.8 3.8   Liver Function Tests: Recent Labs  Lab 05/25/17 1130 05/26/17 0826 05/27/17 0520  AST  --   --  25  ALT  --   --  53  ALKPHOS  --   --  130*  BILITOT  --   --  0.8  PROT  --   --  4.6*  ALBUMIN 2.0* 2.0* 2.1*   No results for input(s): LIPASE, AMYLASE in the last 168 hours. No results for input(s): AMMONIA in the last 168 hours. CBC: Recent Labs  Lab 05/22/17 2105  05/23/17 2024 05/24/17 0432 05/25/17 0359 05/26/17 0826 05/27/17 0520  WBC 6.4   < > 7.5 6.2 5.1 7.1 7.9  NEUTROABS  5.9  --   --  4.5  --   --  6.6  HGB 6.3*   < > 8.6* 8.3* 8.2* 8.2* 8.5*  HCT 19.2*   < > 25.0* 24.1* 24.4* 24.1* 25.2*  MCV 87.7   < > 85.3 85.8 86.8 85.8 86.3  PLT 47*   < > 61* 46* 31* 25* 33*   < > = values in this interval not displayed.   Cardiac Enzymes: Recent Labs  Lab 05/22/17 2117  TROPONINI 0.04*   CBG: Recent Labs  Lab 05/26/17 0604 05/26/17 1203 05/26/17 1649 05/26/17 2151 05/27/17 0627  GLUCAP 155* 114* 271* 233* 133*    Iron Studies:  Recent Labs    05/25/17 0359  IRON 26*  TIBC 140*  FERRITIN 1,689*   Studies/Results: No results found.  Medications: Infusions: . sodium chloride       Scheduled Medications: . amiodarone  200 mg Oral Daily  . collagenase   Topical Daily  . dexamethasone  3 mg Oral Q8H  . diltiazem  60 mg Oral Q8H  . feeding supplement (ENSURE ENLIVE)  237 mL Oral BID BM  . guaiFENesin  600 mg Oral BID  . insulin aspart  0-5 Units Subcutaneous QHS  . insulin aspart  0-9 Units Subcutaneous TID WC  . insulin aspart  2 Units Subcutaneous TID WC  . insulin glargine  12 Units Subcutaneous Daily  . metoprolol tartrate  12.5 mg Oral BID  . mirtazapine  30 mg Oral QHS    have reviewed scheduled and prn medications.  Physical Exam: General: Not in distress, lying in bed comfortable Heart: Regular rate rhythm S1-S2 normal. Lungs: Distant breath sound, no wheezing Abdomen: Soft, obese, nontender Extremities: Bandage applied in both legs Dialysis Access: Right IJ non-tunneled HD catheter.  Clean  Dylan [name] 05/27/2017,10:22 AM  LOS: 26 days

## 2017-05-28 LAB — GLUCOSE, CAPILLARY
GLUCOSE-CAPILLARY: 164 mg/dL — AB (ref 65–99)
GLUCOSE-CAPILLARY: 181 mg/dL — AB (ref 65–99)
GLUCOSE-CAPILLARY: 187 mg/dL — AB (ref 65–99)
Glucose-Capillary: 177 mg/dL — ABNORMAL HIGH (ref 65–99)
Glucose-Capillary: 164 mg/dL — ABNORMAL HIGH (ref 65–99)

## 2017-05-28 LAB — CBC
HCT: 22.9 % — ABNORMAL LOW (ref 39.0–52.0)
HEMOGLOBIN: 7.7 g/dL — AB (ref 13.0–17.0)
MCH: 29.1 pg (ref 26.0–34.0)
MCHC: 33.6 g/dL (ref 30.0–36.0)
MCV: 86.4 fL (ref 78.0–100.0)
Platelets: 75 10*3/uL — ABNORMAL LOW (ref 150–400)
RBC: 2.65 MIL/uL — AB (ref 4.22–5.81)
RDW: 15.7 % — ABNORMAL HIGH (ref 11.5–15.5)
WBC: 7.2 10*3/uL (ref 4.0–10.5)

## 2017-05-28 LAB — RENAL FUNCTION PANEL
ALBUMIN: 2.1 g/dL — AB (ref 3.5–5.0)
ANION GAP: 11 (ref 5–15)
BUN: 56 mg/dL — ABNORMAL HIGH (ref 6–20)
CALCIUM: 6.9 mg/dL — AB (ref 8.9–10.3)
CO2: 25 mmol/L (ref 22–32)
Chloride: 100 mmol/L — ABNORMAL LOW (ref 101–111)
Creatinine, Ser: 4.46 mg/dL — ABNORMAL HIGH (ref 0.61–1.24)
GFR calc Af Amer: 16 mL/min — ABNORMAL LOW (ref >60)
GFR calc non Af Amer: 14 mL/min — ABNORMAL LOW (ref >60)
Glucose, Bld: 176 mg/dL — ABNORMAL HIGH (ref 65–99)
PHOSPHORUS: 3.9 mg/dL (ref 2.5–4.6)
POTASSIUM: 4.7 mmol/L (ref 3.5–5.1)
SODIUM: 136 mmol/L (ref 135–145)

## 2017-05-28 LAB — PREPARE PLATELET PHERESIS
UNIT DIVISION: 0
UNIT DIVISION: 0

## 2017-05-28 LAB — BPAM PLATELET PHERESIS
BLOOD PRODUCT EXPIRATION DATE: 201904252359
BLOOD PRODUCT EXPIRATION DATE: 201904252359
ISSUE DATE / TIME: 201904250913
ISSUE DATE / TIME: 201904251146
Unit Type and Rh: 5100
Unit Type and Rh: 8400

## 2017-05-28 LAB — BASIC METABOLIC PANEL
ANION GAP: 17 — AB (ref 5–15)
BUN: 54 mg/dL — ABNORMAL HIGH (ref 6–20)
CHLORIDE: 95 mmol/L — AB (ref 101–111)
CO2: 25 mmol/L (ref 22–32)
CREATININE: 4.42 mg/dL — AB (ref 0.61–1.24)
Calcium: 7.1 mg/dL — ABNORMAL LOW (ref 8.9–10.3)
GFR calc non Af Amer: 14 mL/min — ABNORMAL LOW (ref >60)
GFR, EST AFRICAN AMERICAN: 17 mL/min — AB (ref >60)
Glucose, Bld: 173 mg/dL — ABNORMAL HIGH (ref 65–99)
Potassium: 4.7 mmol/L (ref 3.5–5.1)
SODIUM: 137 mmol/L (ref 135–145)

## 2017-05-28 LAB — PROTIME-INR
INR: 1.22
PROTHROMBIN TIME: 15.3 s — AB (ref 11.4–15.2)

## 2017-05-28 MED ORDER — SODIUM CHLORIDE 0.9 % IV SOLN
1.5000 g | INTRAVENOUS | Status: AC
  Administered 2017-05-31: 1.5 g via INTRAVENOUS

## 2017-05-28 MED ORDER — SODIUM CHLORIDE 0.9 % IV SOLN: 1.5000 g | INTRAVENOUS | Status: DC

## 2017-05-28 NOTE — Progress Notes (Signed)
RN changed patient's wound on buttocks, applied medications and replaced dressings. PT tolerated well.

## 2017-05-28 NOTE — Progress Notes (Signed)
Dylan [name] NEPHROLOGY PROGRESS NOTE  Assessment/ Plan: Pt is a 52 y.o. 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 fib, diabetes, morbid obesity, CKD with baseline creatinine around 1.6, admitted for acute kidney injury, obstructive uropathy and hyperkalemia requiring intermittent hemodialysis.  Assessment/Plan:  #Acute kidney injury due to ATN from supratherapeutic vancomycin.  Patient is now getting intermittent hemodialysis via temporary catheter. Last hemodialysis on 4/22, tolerated well.  He is now able to lie on the stretcher during dialysis.  Vein mapping was done.  Patient with very minimal urine output however clinically stable today.  He has reducing muscle mass.  Watch for any sign of renal recovery.  Vascular was consulted for placement of tunneled catheter and creation of AV fistula for possible need of hemodialysis long-term.  I will watch his kidney function over the weekend.  His volume status is acceptable.  Electrolytes acceptable.  No need for dialysis today.    #Neurogenic bladder/acute urinary retention secondary due to blockage of Foley catheter, underwent exchange by urology.  #Normocytic anemia: Iron saturation 19% with elevated ferritin level of 1689.  Hb 8.2.  Monitor CBC.   #Severe thrombocytopenia thought to be due to drug-related/non-immune mediated.  Evaluated by hematologist.  Received 2 units of platelet yesterday.  Platelet count is 75.   no sign of active bleed.    #Candidemia: Patient completed 10 days of anidulafungin.  #Proximal atrial fibrillation: On amiodarone, metoprolol and diltiazem.  No anticoagulation.  #Hypertension: Blood pressure acceptable.  Continue to monitor.  Subjective: Seen and examined at bedside.  Denies headache, dizziness, nausea vomiting chest pain shortness of breath.  Objective Vital signs in last 24 hours: Vitals:   05/28/17 0159 05/28/17 0500 05/28/17 0831 05/28/17 1211  BP: (!)  151/89  (!) 154/91 134/78  Pulse: 72  77 78  Resp: 18  20 16   Temp:   98.4 F (36.9 C) 98.3 F (36.8 C)  TempSrc:   Oral Oral  SpO2: 100%  100% 100%  Weight:  125.2 kg (276 lb)    Height:       Weight change:   Intake/Output Summary (Last 24 hours) at 05/28/2017 1305 Last data filed at 05/28/2017 1220 Gross per 24 hour  Intake 60 ml  Output 250 ml  Net -190 ml       Labs: Basic Metabolic Panel: Recent Labs  Lab 05/26/17 0826 05/27/17 0520 05/28/17 0422  NA 135 136 137  136  K 4.3 4.5 4.7  4.7  CL 98* 100* 95*  100*  CO2 28 26 25  25   GLUCOSE 148* 146* 173*  176*  BUN 36* 47* 54*  56*  CREATININE 3.31* 3.95* 4.42*  4.46*  CALCIUM 6.9* 7.0* 7.1*  6.9*  PHOS 3.8 3.8 3.9   Liver Function Tests: Recent Labs  Lab 05/26/17 0826 05/27/17 0520 05/28/17 0422  AST  --  25  --   ALT  --  53  --   ALKPHOS  --  130*  --   BILITOT  --  0.8  --   PROT  --  4.6*  --   ALBUMIN 2.0* 2.1* 2.1*   No results for input(s): LIPASE, AMYLASE in the last 168 hours. No results for input(s): AMMONIA in the last 168 hours. CBC: Recent Labs  Lab 05/22/17 2105  05/24/17 0432 05/25/17 0359 05/26/17 0826 05/27/17 0520 05/28/17 0422  WBC 6.4   < > 6.2 5.1 7.1 7.9 7.2  NEUTROABS 5.9  --  4.5  --   --  6.6  --   HGB 6.3*   < > 8.3* 8.2* 8.2* 8.5* 7.7*  HCT 19.2*   < > 24.1* 24.4* 24.1* 25.2* 22.9*  MCV 87.7   < > 85.8 86.8 85.8 86.3 86.4  PLT 47*   < > 46* 31* 25* 33* 75*   < > = values in this interval not displayed.   Cardiac Enzymes: Recent Labs  Lab 05/22/17 2117  TROPONINI 0.04*   CBG: Recent Labs  Lab 05/27/17 1708 05/27/17 2132 05/28/17 0623 05/28/17 0836 05/28/17 1206  GLUCAP 243* 254* 164* 177* 164*    Iron Studies:  No results for input(s): IRON, TIBC, TRANSFERRIN, FERRITIN in the last 72 hours. Studies/Results: No results found.  Medications: Infusions: . sodium chloride    . cefUROXime (ZINACEF)  IV    . [START ON 06/15/17] cefUROXime  (ZINACEF)  IV      Scheduled Medications: . amiodarone  200 mg Oral Daily  . collagenase   Topical Daily  . dexamethasone  3 mg Oral Q8H  . diltiazem  60 mg Oral Q8H  . feeding supplement (ENSURE ENLIVE)  237 mL Oral BID BM  . guaiFENesin  600 mg Oral BID  . insulin aspart  0-5 Units Subcutaneous QHS  . insulin aspart  0-9 Units Subcutaneous TID WC  . insulin aspart  2 Units Subcutaneous TID WC  . insulin glargine  12 Units Subcutaneous Daily  . metoprolol tartrate  12.5 mg Oral BID  . mirtazapine  30 mg Oral QHS    have reviewed scheduled and prn medications.  Physical Exam: General: Not in distress Heart: Regular rate rhythm S1-S2 normal. Lungs: Bilateral, no wheezing Abdomen: Soft, obese, nontender Extremities: Bandage applied in both legs Dialysis Access: Right IJ non-tunneled HD catheter.  Clean  Dron Prasad Bhandari 05/28/2017,1:05 PM  LOS: 27 days

## 2017-05-28 NOTE — Progress Notes (Signed)
RN bladder scanned PT and results were 718. RN performed an IN & Out and only retrieved 150 ML of urine. Urine is foul smelling . MD called and stated for night shift to attempt and bladder scan and In & out if needed; and if no success to re-insert foley and send a urine culture. Recommendations were passed down to night RN.

## 2017-05-28 NOTE — Progress Notes (Signed)
PROGRESS NOTE    MCKOY BHAKTA  ZOX:[id number] DOB: 1965/12/04 DOA: 2017-05-25 PCP: Patient, No Pcp Per   Brief Narrative: Patient is a 52 year old male with past medical history significant for recent quadriplegia, multiple sclerosis, decompression of the spinal abscess, spinal stenosis who has been  admitted for the management of acute kidney injury.  He was recently admitted to neurosurgery on 04/12/2017 and had surgery(corpectomy/decompression surgery) on 03/25/17 for cord compression due to spinal stenosis at C6 ,C5 and C7.  Postoperative course was completed with atrial fibrillation, started on Eliquis but discontinued due to spinal hematoma. Also epidural clot versus abscess was suspected on his follow up  C-spine MRI. Because of the high risk patient decided not to repeat exploratory surgery.  He was also started on IV antibiotics due to complicated UTI ,possible cervical abscess and was discharged to skilled nursing facility on 04/09/2017.   His Foley catheter was not draining at the skilled nursing facility so he was sent to the emergency department.  Found to have acute kidney injury. Urology was also consulted, flushed his Foley with removal of foreign body yeilding 2.5 L of urine. Currently patient has been managed for acute kidney injury thought to be secondary to possible vancomycin related.  Nephrology following.  Patient hospital course is also complicated with sepsis, Candida glabrata fungal anemia, progressive oliguric renal failure, hyperkalemia , thrombocytopenia.  Assessment & Plan:   Principal Problem:   MDD (major depressive disorder), single episode, moderate (HCC) Active Problems:   Uncontrolled diabetes mellitus type 2 without complications (HCC)   Hypertension   Morbid obesity (HCC)   Paroxysmal atrial fibrillation (HCC)   Multiple sclerosis exacerbation (HCC)   Pressure injury of skin   Hyperkalemia   AKI (acute kidney injury) (HCC)   Hematoma   Insomnia  Adjustment reaction with anxiety and depression   Quadriplegia (HCC)   History of fusion of cervical spine   Postoperative wound infection   Coronary artery disease   Atherosclerotic peripheral vascular disease (HCC)   Candidemia (HCC)   Goals of care, counseling/discussion   Palliative care by specialist  Acute renal failure: Thought to be secondary to obstructive uropathy versus vancomycin-induced renal injury.  Nephrology is following.  He has been started on dialysis since 05/07/2017.  He has a temporary dialysis catheter . Plan for permanent dialysis catheter placement.  Vascular surgery consulted by nephrology for the placement of tunneled catheter and AV fistula creation.  Vein mapping done. There was also concern that patient might need stretcher dialysis which would be possible only out of state.But on last dialysis he was able to sit. Creatinine today is 3.9.  His last dialysis was on 05/24/2017 and he was able to be dialyzed in  a chair.  Severe thrombocytopenia: Thought to be secondary to drug related.  Anidulafungin, daptomycin and cefepime stopped.  Protonix also stopped.  HIT negative.  Patient does not receive heparin with dialysis.  Hematology was also consulted .He has received several units of platelets transfusion. Hematology feels that this is not likely immune medicine thrombocytopenia given patient response to transfusion.  We will consult hematology again if his platelet level fall down. Platelets count today is 75000 after 2 units of platelets yesterday.  Continue to monitor CBC.  GI bleed/acute blood loss anemia/acute on chronic anemia: Protonix stopped due to thrombocytopenia.  Status post 2 units of PRBC.  No further bleeding noted for last few days. GI on board.  Recommending continue close monitoring.  Recommended to reconsult if  rebleeding.  No new episodes of hematochezia .hemoglobin today 7.7 .will reconsult GI if  new episode of GI bleed occurs.  Elevated  troponin: Mild.  Did not trend up.  Patient denies any chest pain.  Likely supply demand ischemia  Quadriplegia/cervical abscess versus cervical hematoma/infected cervical hematoma: Patient completed 6 weeks of antibiotics including 4 weeks of vancomycin and cefepime followed by 2 weeks of daptomycin and cefepime.  Course completed on 05/16/2017. Neurosurgery was following before.  ID was following  Neurogenic bladder: Patient was discharged with Foley catheter on 04/09/2017 in the context of new quadriplegia. Foley catheter exhanged on admission. Now off foley.  He makes very little urine.  He has been continued on in and out cath as needed.  Candidemia: Patient had 10 days of  Anidulafungin which has been discontinued due to thrombocytopenia. Ophthalmology also consulted, no evidence of intraocular fungal infection.  Echocardiogram did not show any vegetation.  Diabetes type 2: Continue Lantus, sliding scale insulin.  We will continue monitor blood sugars.  Paroxysmal A. fib: No anticoagulation given his current thrombocytopenia.  Continue amiodarone, metoprolol and Cardizem.  Multiple sclerosis: Recently diagnosed and he was given 5 days of high-dose steroids with improvement.  Neurology consulted.  Recommended to follow as an outpatient.  Patient has braces on bilateral  Forearms to prevent spasticity.  Adjustment disorder/depression: Continue mirtazapine.  Psychiatry was following.  Recommended trazodone.  Hyponatremia/hypokalemia: Currently stable  Unstageable pressure injury on bilateral buttocks: Continue supportive care.  Continue wound care  Nocturnal hypoxia: No history of OSA or the use of CPAP.  Continue supplemental oxygen as needed/CPAP at night.  Goals of care: Palliative care was consulted.  Remains full code    DVT prophylaxis: SCD Code Status: Full Family Communication: Discussed with [name] on phone Disposition Plan: Skilled nursing facility after improvement  in thrombocytopenia and after starting  Dialysis,AVF placement   Consultants: Nephrology, neurology, psychiatry, infectious disease, ophthalmology, GI  Procedures: Echocardiogram, upper external vein mapping  Antimicrobials: None at the moment  Subjective: Patient seen and examined at bedside this morning.  No new issues/events.  Comfortable.  Objective: Vitals:   05/28/17 0159 05/28/17 0500 05/28/17 0831 05/28/17 1211  BP: (!) 151/89  (!) 154/91 134/78  Pulse: 72  77 78  Resp: 18  20 16   Temp:   98.4 F (36.9 C) 98.3 F (36.8 C)  TempSrc:   Oral Oral  SpO2: 100%  100% 100%  Weight:  125.2 kg (276 lb)    Height:        Intake/Output Summary (Last 24 hours) at 05/28/2017 1346 Last data filed at 05/28/2017 1220 Gross per 24 hour  Intake 60 ml  Output 250 ml  Net -190 ml   Filed Weights   05/24/17 0500 05/26/17 0426 05/28/17 0500  Weight: 130.2 kg (287 lb 0.6 oz) 132.5 kg (292 lb 1.8 oz) 125.2 kg (276 lb)    Examination:  General exam: Appears calm and comfortable ,Not in distress, morbidly obese HEENT:PERRL,Oral mucosa moist, Ear/Nose normal on gross exam, temporary dialysis catheter on the right neck Respiratory system: Bilateral decreased air entry on  the bases Cardiovascular system: S1 & S2 heard, RRR. No JVD, murmurs, rubs, gallops or clicks. Gastrointestinal system: Abdomen is nondistended, soft and nontender. No organomegaly or masses felt. Normal bowel sounds heard. Central nervous system: Alert and oriented.  Quadriplegia Extremities: No edema, no clubbing ,no cyanosis, distal peripheral pulses palpable,  Braces on the bilateral upper extremities to prevent spasticity Skin: unstageable  pressure ulcer on the buttocks MSK: Normal muscle bulk,tone ,power Psychiatry: Judgement and insight appear normal. Mood & affect appropriate.       Data Reviewed: I have personally reviewed following labs and imaging studies  CBC: Recent Labs  Lab 05/22/17 2105   05/24/17 0432 05/25/17 0359 05/26/17 0826 05/27/17 0520 05/28/17 0422  WBC 6.4   < > 6.2 5.1 7.1 7.9 7.2  NEUTROABS 5.9  --  4.5  --   --  6.6  --   HGB 6.3*   < > 8.3* 8.2* 8.2* 8.5* 7.7*  HCT 19.2*   < > 24.1* 24.4* 24.1* 25.2* 22.9*  MCV 87.7   < > 85.8 86.8 85.8 86.3 86.4  PLT 47*   < > 46* 31* 25* 33* 75*   < > = values in this interval not displayed.   Basic Metabolic Panel: Recent Labs  Lab 05/22/17 1227  05/25/17 0359 05/25/17 1130 05/26/17 0826 05/27/17 0520 05/28/17 0422  NA 138   < > 136 135 135 136 137  136  K 4.3   < > 3.4* 4.0 4.3 4.5 4.7  4.7  CL 104   < > 100* 100* 98* 100* 95*  100*  CO2 24   < > 27 25 28 26 25  25   GLUCOSE 172*   < > 81 154* 148* 146* 173*  176*  BUN 37*   < > 23* 29* 36* 47* 54*  56*  CREATININE 3.45*   < > 2.46* 2.82* 3.31* 3.95* 4.42*  4.46*  CALCIUM 6.8*   < > 6.7* 6.8* 6.9* 7.0* 7.1*  6.9*  MG  --   --   --   --   --  1.7  --   PHOS 4.1  --   --  3.6 3.8 3.8 3.9   < > = values in this interval not displayed.   GFR: Estimated Creatinine Clearance: 28.6 mL/min (A) (by C-G formula based on SCr of 4.46 mg/dL (H)). Liver Function Tests: Recent Labs  Lab 05/22/17 1227 05/25/17 1130 05/26/17 0826 05/27/17 0520 05/28/17 0422  AST  --   --   --  25  --   ALT  --   --   --  53  --   ALKPHOS  --   --   --  130*  --   BILITOT  --   --   --  0.8  --   PROT  --   --   --  4.6*  --   ALBUMIN 1.7* 2.0* 2.0* 2.1* 2.1*   No results for input(s): LIPASE, AMYLASE in the last 168 hours. No results for input(s): AMMONIA in the last 168 hours. Coagulation Profile: Recent Labs  Lab 05/22/17 0846 05/28/17 0422  INR 1.21 1.22   Cardiac Enzymes: Recent Labs  Lab 05/22/17 2117  TROPONINI 0.04*   BNP (last 3 results) No results for input(s): PROBNP in the last 8760 hours. HbA1C: No results for input(s): HGBA1C in the last 72 hours. CBG: Recent Labs  Lab 05/27/17 1708 05/27/17 2132 05/28/17 0623 05/28/17 0836  05/28/17 1206  GLUCAP 243* 254* 164* 177* 164*   Lipid Profile: No results for input(s): CHOL, HDL, LDLCALC, TRIG, CHOLHDL, LDLDIRECT in the last 72 hours. Thyroid Function Tests: No results for input(s): TSH, T4TOTAL, FREET4, T3FREE, THYROIDAB in the last 72 hours. Anemia Panel: No results for input(s): VITAMINB12, FOLATE, FERRITIN, TIBC, IRON, RETICCTPCT in the last 72 hours. Sepsis Labs: No results for  input(s): PROCALCITON, LATICACIDVEN in the last 168 hours.  Recent Results (from the past 240 hour(s))  MRSA PCR Screening     Status: None   Collection Time: 05/20/17 11:31 AM  Result Value Ref Range Status   MRSA by PCR NEGATIVE NEGATIVE Final    Comment:        The GeneXpert MRSA Assay (FDA approved for NASAL specimens only), is one component of a comprehensive MRSA colonization surveillance program. It is not intended to diagnose MRSA infection nor to guide or monitor treatment for MRSA infections. Performed at [facility], [address] [place], [place] [address]          Radiology Studies: No results found.      Scheduled Meds: . amiodarone  200 mg Oral Daily  . collagenase   Topical Daily  . dexamethasone  3 mg Oral Q8H  . diltiazem  60 mg Oral Q8H  . feeding supplement (ENSURE ENLIVE)  237 mL Oral BID BM  . guaiFENesin  600 mg Oral BID  . insulin aspart  0-5 Units Subcutaneous QHS  . insulin aspart  0-9 Units Subcutaneous TID WC  . insulin aspart  2 Units Subcutaneous TID WC  . insulin glargine  12 Units Subcutaneous Daily  . metoprolol tartrate  12.5 mg Oral BID  . mirtazapine  30 mg Oral QHS   Continuous Infusions: . sodium chloride    . cefUROXime (ZINACEF)  IV    . [START ON 05/08/2017] cefUROXime (ZINACEF)  IV       LOS: 27 days    Time spent: 25 mins.More than 50% of that time was spent in counseling and/or coordination of care.      Burnadette Pop, MD [facility] Pager [phone]  If 7PM-7AM, please contact  night-coverage [email or web address] Password [email or web address] 05/28/2017, 1:46 PM

## 2017-05-28 NOTE — Progress Notes (Signed)
Occupational Therapy Treatment Patient Details Name: Dylan [name] MRN: [id number] DOB: 1966-01-18 Today's Date: 05/28/2017    History of present illness Dylan [name] is a 52yo black male who [facility] 3[facility] with ABD pain and hypoglycemia, admitted with cervical abcess v hematoma s/p C five-seven surgery on . 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 He DC to SNF ~3WA with motor weakness C7 down, full sensation intact. He reports graual worsening of manipulative use with smartphone over those three weaks.     OT comments  Discussed case with wound care nurse. Due to multiple sacral and buttocks wounds (see WOC note), feel pt should not be placed in chair for dialysis due to resulting sustained pressure on wounds.  Focus of session on using WHO (wrist splints) for self feeding and BUE strengthening. Educated pt on importance of his role in directing care. Pt verbalized understanding. NT educated on use of B wrist splints for positioning and self feeding. B resting hand splints ordered to use at night only. Will have unit secretary order B prevalon boots.   Follow Up Recommendations  SNF;Supervision/Assistance - 24 hour    Equipment Recommendations  Wheelchair (measurements OT);Wheelchair cushion (measurements OT)    Recommendations for Other Services      Precautions / Restrictions Precautions Precautions: Fall;Cervical;Other (comment) Precaution Comments: watch BP              ADL either performed or assessed with clinical judgement   ADL Overall ADL's : Needs assistance/impaired Eating/Feeding: Moderate assistance;With adaptive utensils Eating/Feeding Details (indicate cue type and reason): focus on self feeding. Using WHO with spoon placed in u-cuff at angel. Pt with increased ability to bring spoon to mouth; Ableto  load spoon with pudding with mod A. RUE positioned on pillows to reduce need for shoulder movement. Bowl held at this time instead of being placed on table.                                    General ADL Comments: Educated pt on goal of "self directing care".Pt verbalized understading. Pt directed therpist on correctly donning R WHO for self feeding. NT educated on donning/doffing WHO and purpose of splints.      Vision       Perception     Praxis      Cognition Arousal/Alertness: Awake/alert Behavior During Therapy: WFL for tasks assessed/performed Overall Cognitive Status: Within Functional Limits for tasks assessed Area of Impairment: Orientation(thought it was Sunday)                 Orientation Level: Disoriented to;Time                      Exercises General Exercises - Upper Extremity Shoulder Flexion: AAROM;Both;15 reps;Supine Shoulder Extension: AAROM;Both;15 reps;Supine Shoulder ABduction: AAROM;Both;15 reps;Supine Shoulder ADduction: AAROM;Both;15 reps;Supine Shoulder Horizontal ABduction: Both;15 reps;Supine Shoulder Horizontal ADduction: Both;15 reps;Supine Elbow Flexion: Both;Strengthening;20 reps;Supine Elbow Extension: AAROM;20 reps;Both;Supine Wrist Flexion: AROM;15 reps;Supine;Both Wrist Extension: Left;PROM;10 reps;Right;Strengthening;15 reps Other Exercises Other Exercises: gentle passive extension L elbow - tight flexor tendons; developing elbow flexion contracture.  Other Exercises: incentive spirometer x 10   Shoulder Instructions       General Comments      Pertinent Vitals/ Pain  Pain Assessment: Faces Faces Pain Scale: Hurts little more Pain Location: with L elbow extension Pain Descriptors / Indicators: Grimacing;Guarding Pain Intervention(s): Limited activity within patient's tolerance  Home Living                                          Prior Functioning/Environment               Frequency  Min 2X/week        Progress Toward Goals  OT Goals(current goals can now be found in the care plan section)  Progress towards OT goals: Progressing toward goals  Acute Rehab OT Goals Patient Stated Goal: to feed myself OT Goal Formulation: With patient Time For Goal Achievement: 06/10/17 Potential to Achieve Goals: Good ADL Goals Pt Will Perform Eating: with mod assist;with adaptive utensils;sitting Pt Will Perform Grooming: with mod assist;sitting;with adaptive equipment Pt/caregiver will Perform Home Exercise Program: Increased ROM;Increased strength;Both right and left upper extremity;With minimal assist Additional ADL Goal #1: Pt will demonstrate ability to self-direct personal care during ADL/mobility completion. Additional ADL Goal #2: Pt will independently direct caregiver on donning/doffing WHO and purpose of splints.  Plan Discharge plan remains appropriate    Co-evaluation                 AM-PAC PT "6 Clicks" Daily Activity     Outcome Measure   Help from another person eating meals?: A Lot Help from another person taking care of personal grooming?: A Lot Help from another person toileting, which includes using toliet, bedpan, or urinal?: Total Help from another person bathing (including washing, rinsing, drying)?: A Lot Help from another person to put on and taking off regular upper body clothing?: Total Help from another person to put on and taking off regular lower body clothing?: Total 6 Click Score: 9    End of Session    OT Visit Diagnosis: Muscle weakness (generalized) (M62.81);Other symptoms and signs involving the nervous system (R29.898)   Activity Tolerance Patient tolerated treatment well   Patient Left in bed;with call bell/phone within reach   Nurse Communication Other (comment)(splinting)        Time: 1255-1330 OT Time Calculation (min): 35 min  Charges: OT General Charges $OT Visit: 1 Visit OT Treatments $Self  Care/Home Management : 23-37 mins  [facility], OT/L  [phone] 05/28/2017   Brodie Scovell,HILLARY 05/28/2017, 2:33 PM

## 2017-05-28 NOTE — Progress Notes (Signed)
   Platelets improved this morning.  He is on the schedule for Monday morning for right arm fistula and conversion to tunneled dialysis catheter.  He is in good understanding.  Unfortunately his right arm is his better arm from a mobility standpoint but also his best opportunity for fistula creation and with his infection risk I think this is his best choice.  I have again discussed with him.  He is n.p.o. past midnight on Sunday and demonstrates good understanding.  Next  [employer]. Randie Heinz, MD [facility] Office: [phone] Pager: [phone]

## 2017-05-28 NOTE — Progress Notes (Signed)
Orthopedic Tech Progress Note Patient Details:  Dylan [name] 1965/06/03 [id number]  Patient ID: Jacqulynn Cadet, male   DOB: 1965-04-11, 52 y.o.   MRN: [id number]   Saul Fordyce 05/28/2017, 12:04 PMCalled [employer] for bilateral resting hand splint.

## 2017-05-28 NOTE — Progress Notes (Signed)
Per OT and wound care RN patient does not need to sit in dialysis chair for hours for dialysis d/t wounds on his sacrum and buttocks. Plan continues to be for a bedside dialysis center which are out of state.    CM spoke to one of the admission coordinators at [facility] in [place]. They asked for updated information to be faxed to: [phone].   CM spoke to admissions for [facility] and they are not currently doing bedside dialysis.   CM continuing to follow.

## 2017-05-28 NOTE — Progress Notes (Signed)
Occupational Therapy Treatment Patient Details Name: Dylan [name] MRN: [id number] DOB: 01-03-1966 Today's Date: 05/28/2017    History of present illness Dylan [name] is a 52yo black male who comes to [facility] on 3/30 with ABD pain and hypoglycemia, admitted with cervical abcess v hematoma s/p C five-seven surgery on . 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 He DC to SNF ~3WA with motor weakness C7 down, full sensation intact. He reports graual worsening of manipulative use with smartphone over those three weaks.     OT comments  Pt received B resting hand splints and appears to be tolerating well. Pt to wear resting hand splints during night time hours only. Pt positioned with B prevalon boots with block placed laterally to decrease amount of external rotation and "frog leg " position. Friend present for pm session and began education on use of WHO with U-cuff to increase independence with self feeding. Friend able to assist pt with feeding himself ice cream using U-cuff. Splint education sheet posted in room. Will continue to follow.  Encourage pt to be in modified chair position over the weekend. Encourage use of incentive spirometer.    Follow Up Recommendations  SNF;Supervision/Assistance - 24 hour    Equipment Recommendations  Wheelchair (measurements OT);Wheelchair cushion (measurements OT); hospital bed/Air mattress   Recommendations for Other Services      Precautions / Restrictions Precautions Precautions: Fall;Cervical;Other (comment) Precaution Comments: watch BP Required Braces or Orthoses: Other Brace/Splint Other Brace/Splint: B resting hand splints for night use only; B WHO to use PRN during the day       Mobility Bed Mobility               General bed mobility comments: TOTAL A +2 to scoot up in  bed  Transfers                      Balance                                           ADL either performed or assessed with clinical judgement   ADL                                         General ADL Comments: Friend present; began educatio on use of WHO/U-cuff for self feeding using R hand. Pt's friend able to return demonstrate and working with pt on feeding himself; also began education on ROM and positioning.     Vision       Perception     Praxis      Cognition Arousal/Alertness: Awake/alert Behavior During Therapy: WFL for tasks assessed/performed Overall Cognitive Status: Within Functional Limits for tasks assessed                                          Exercises     Shoulder Instructions       General Comments Fit with B prevalon boots with stops placed to decrease external rotation. Blanket/pillows used lateral of leg to improve positioning  Pertinent Vitals/ Pain       Pain Assessment: Faces Faces Pain Scale: Hurts little more Pain Location: with L elbow extension Pain Descriptors / Indicators: Grimacing;Guarding Pain Intervention(s): Limited activity within patient's tolerance  Home Living                                          Prior Functioning/Environment              Frequency  Min 3X/week        Progress Toward Goals  OT Goals(current goals can now be found in the care plan section)  Progress towards OT goals: Progressing toward goals  Acute Rehab OT Goals Patient Stated Goal: to feed myself OT Goal Formulation: With patient Time For Goal Achievement: 06/10/17 Potential to Achieve Goals: Good ADL Goals Pt Will Perform Eating: with mod assist;with adaptive utensils;sitting Pt Will Perform Grooming: with mod assist;sitting;with adaptive equipment Pt/caregiver will Perform Home Exercise Program: Increased ROM;Increased strength;Both right and  left upper extremity;With minimal assist Additional ADL Goal #1: Pt will demonstrate ability to self-direct personal care during ADL/mobility completion. Additional ADL Goal #2: Pt will independently direct caregiver on donning/doffing WHO and purpose of splints. Additional ADL Goal #3: Pt will tolerate B resting hand splints to improve functional positioning of hands without complications  Plan Discharge plan remains appropriate;Frequency needs to be updated    Co-evaluation                 AM-PAC PT "6 Clicks" Daily Activity     Outcome Measure   Help from another person eating meals?: A Lot Help from another person taking care of personal grooming?: A Lot Help from another person toileting, which includes using toliet, bedpan, or urinal?: Total Help from another person bathing (including washing, rinsing, drying)?: A Lot Help from another person to put on and taking off regular upper body clothing?: Total Help from another person to put on and taking off regular lower body clothing?: Total 6 Click Score: 9    End of Session    OT Visit Diagnosis: Muscle weakness (generalized) (M62.81);Other symptoms and signs involving the nervous system (R29.898)   Activity Tolerance Patient tolerated treatment well   Patient Left in bed;in CPM;with family/visitor present;with SCD's reapplied   Nurse Communication Other (comment)(splinting/positioning)        Time: 6962-9528 OT Time Calculation (min): 27 min  Charges: OT General Charges $OT Visit: 1 Visit OT Treatments $Therapeutic Activity: 23-37 mins  [facility], OT/L  [phone] 05/28/2017   Ailea Rhatigan,HILLARY 05/28/2017, 6:03 PM

## 2017-05-29 DIAGNOSIS — N390 Urinary tract infection, site not specified: Secondary | ICD-10-CM

## 2017-05-29 LAB — RENAL FUNCTION PANEL
ALBUMIN: 2 g/dL — AB (ref 3.5–5.0)
ANION GAP: 12 (ref 5–15)
BUN: 65 mg/dL — AB (ref 6–20)
CALCIUM: 6.8 mg/dL — AB (ref 8.9–10.3)
CO2: 27 mmol/L (ref 22–32)
Chloride: 99 mmol/L — ABNORMAL LOW (ref 101–111)
Creatinine, Ser: 4.72 mg/dL — ABNORMAL HIGH (ref 0.61–1.24)
GFR calc Af Amer: 15 mL/min — ABNORMAL LOW (ref >60)
GFR calc non Af Amer: 13 mL/min — ABNORMAL LOW (ref >60)
GLUCOSE: 216 mg/dL — AB (ref 65–99)
PHOSPHORUS: 3.8 mg/dL (ref 2.5–4.6)
Potassium: 4.8 mmol/L (ref 3.5–5.1)
SODIUM: 138 mmol/L (ref 135–145)

## 2017-05-29 LAB — URINALYSIS, ROUTINE W REFLEX MICROSCOPIC
Bilirubin Urine: NEGATIVE
Glucose, UA: NEGATIVE mg/dL
Ketones, ur: NEGATIVE mg/dL
Nitrite: NEGATIVE
Protein, ur: 100 mg/dL — AB
Specific Gravity, Urine: 1.009 (ref 1.005–1.030)
WBC, UA: >50 WBC/hpf — ABNORMAL HIGH (ref 0–5)
pH: 6 (ref 5.0–8.0)

## 2017-05-29 LAB — GLUCOSE, CAPILLARY
Glucose-Capillary: 218 mg/dL — ABNORMAL HIGH (ref 65–99)
Glucose-Capillary: 128 mg/dL — ABNORMAL HIGH (ref 65–99)
Glucose-Capillary: 164 mg/dL — ABNORMAL HIGH (ref 65–99)
Glucose-Capillary: 101 mg/dL — ABNORMAL HIGH (ref 65–99)

## 2017-05-29 MED ORDER — ALTEPLASE 2 MG IJ SOLR
2.0000 mg | Freq: Once | INTRAMUSCULAR | Status: DC | PRN
  Filled 2017-05-29: qty 2

## 2017-05-29 MED ORDER — DARBEPOETIN ALFA 40 MCG/0.4ML IJ SOSY
40.0000 ug | PREFILLED_SYRINGE | Freq: Once | INTRAMUSCULAR | Status: AC
  Administered 2017-05-29: 40 ug via INTRAVENOUS
  Filled 2017-05-29: qty 0.4

## 2017-05-29 MED ORDER — DARBEPOETIN ALFA 40 MCG/0.4ML IJ SOSY
PREFILLED_SYRINGE | INTRAMUSCULAR | Status: AC
  Filled 2017-05-29: qty 0.4

## 2017-05-29 MED ORDER — SODIUM CHLORIDE 0.9 % IV SOLN
1.0000 g | INTRAVENOUS | Status: DC
  Administered 2017-05-29 – 2017-06-01 (×4): 1 g via INTRAVENOUS
  Filled 2017-05-29 (×5): qty 10

## 2017-05-29 MED ORDER — HEPARIN SODIUM (PORCINE) 1000 UNIT/ML DIALYSIS
1000.0000 [IU] | INTRAMUSCULAR | Status: DC | PRN
  Filled 2017-05-29: qty 1

## 2017-05-29 MED ORDER — LIDOCAINE HCL (PF) 1 % IJ SOLN: 5.0000 mL | INTRAMUSCULAR | Status: DC | PRN

## 2017-05-29 MED ORDER — SODIUM CHLORIDE 0.9 % IV SOLN: 100.0000 mL | INTRAVENOUS | Status: DC | PRN

## 2017-05-29 MED ORDER — LIDOCAINE-PRILOCAINE 2.5-2.5 % EX CREA: 1.0000 "application " | TOPICAL_CREAM | CUTANEOUS | Status: DC | PRN

## 2017-05-29 MED ORDER — SODIUM CHLORIDE 0.9 % IV SOLN
125.0000 mg | Freq: Once | INTRAVENOUS | Status: AC
  Administered 2017-05-29: 125 mg via INTRAVENOUS
  Filled 2017-05-29: qty 10

## 2017-05-29 MED ORDER — PENTAFLUOROPROP-TETRAFLUOROETH EX AERO: 1.0000 "application " | INHALATION_SPRAY | CUTANEOUS | Status: DC | PRN

## 2017-05-29 NOTE — Progress Notes (Signed)
Dylan [name] NEPHROLOGY PROGRESS NOTE  Assessment/ Plan: Pt is a 52 y.o. 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 fib, diabetes, morbid obesity, CKD with baseline creatinine around 1.6, admitted for acute kidney injury, obstructive uropathy and hyperkalemia requiring intermittent hemodialysis.  Assessment/Plan:  #Acute kidney injury due to ATN from supratherapeutic vancomycin.  Patient is now getting intermittent hemodialysis via temporary catheter. Last hemodialysis on 4/22, tolerated well.   Vein mapping was done.  Patient with very minimal urine output and increasing serum creatinine level.  Plan for another dialysis today.  He has reducing muscle mass.  Watch for any sign of renal recovery.  Vascular was consulted for placement of tunneled catheter and creation of AV fistula for possible need of hemodialysis long-term.  Likely procedure on Monday.  Monitor CBC.  #Neurogenic bladder/acute urinary retention secondary due to blockage of Foley catheter, underwent exchange by urology.    #Normocytic anemia: Iron saturation 19% with elevated ferritin level of 1689.  Hemoglobin is low.  I will order a dose of IV iron and iron yesterday during dialysis.  #Severe thrombocytopenia thought to be due to drug-related/non-immune mediated.  Evaluated by hematologist.  Received 2 units of platelet yesterday.  Platelet count 75 yesterday.  No sign of bleeding.  Monitor CBC.  #Candidemia: Patient completed 10 days of anidulafungin.  #Proximal atrial fibrillation: On amiodarone, metoprolol and diltiazem.  No anticoagulation.  #Hypertension: Blood pressure acceptable.  Continue to monitor.  Subjective: Seen and examined at bedside.  Denies headache, dizziness, nausea vomiting chest pain shortness of breath.  Not making enough urine.  Objective Vital signs in last 24 hours: Vitals:   05/29/17 1030 05/29/17 1045 05/29/17 1100 05/29/17 1130  BP: 93/66 (!) 84/57  110/71 (!) 101/51  Pulse: 76 74 72 75  Resp: 18     Temp:      TempSrc:      SpO2:      Weight:      Height:       Weight change: -4.99 kg (-11 lb)  Intake/Output Summary (Last 24 hours) at 05/29/2017 1219 Last data filed at 05/29/2017 0500 Gross per 24 hour  Intake 120 ml  Output 450 ml  Net -330 ml       Labs: Basic Metabolic Panel: Recent Labs  Lab 05/27/17 0520 05/28/17 0422 05/29/17 0316  NA 136 137  136 138  K 4.5 4.7  4.7 4.8  CL 100* 95*  100* 99*  CO2 26 25  25 27   GLUCOSE 146* 173*  176* 216*  BUN 47* 54*  56* 65*  CREATININE 3.95* 4.42*  4.46* 4.72*  CALCIUM 7.0* 7.1*  6.9* 6.8*  PHOS 3.8 3.9 3.8   Liver Function Tests: Recent Labs  Lab 05/27/17 0520 05/28/17 0422 05/29/17 0316  AST 25  --   --   ALT 53  --   --   ALKPHOS 130*  --   --   BILITOT 0.8  --   --   PROT 4.6*  --   --   ALBUMIN 2.1* 2.1* 2.0*   No results for input(s): LIPASE, AMYLASE in the last 168 hours. No results for input(s): AMMONIA in the last 168 hours. CBC: Recent Labs  Lab 05/22/17 2105  05/24/17 0432 05/25/17 0359 05/26/17 0826 05/27/17 0520 05/28/17 0422  WBC 6.4   < > 6.2 5.1 7.1 7.9 7.2  NEUTROABS 5.9  --  4.5  --   --  6.6  --  HGB 6.3*   < > 8.3* 8.2* 8.2* 8.5* 7.7*  HCT 19.2*   < > 24.1* 24.4* 24.1* 25.2* 22.9*  MCV 87.7   < > 85.8 86.8 85.8 86.3 86.4  PLT 47*   < > 46* 31* 25* 33* 75*   < > = values in this interval not displayed.   Cardiac Enzymes: Recent Labs  Lab 05/22/17 2117  TROPONINI 0.04*   CBG: Recent Labs  Lab 05/28/17 0836 05/28/17 1206 05/28/17 1642 05/28/17 2146 05/29/17 0619  GLUCAP 177* 164* 181* 187* 218*    Iron Studies:  No results for input(s): IRON, TIBC, TRANSFERRIN, FERRITIN in the last 72 hours. Studies/Results: No results found.  Medications: Infusions: . sodium chloride    . [START ON 05/16/2017] cefUROXime (ZINACEF)  IV      Scheduled Medications: . amiodarone  200 mg Oral Daily  . collagenase    Topical Daily  . dexamethasone  3 mg Oral Q8H  . diltiazem  60 mg Oral Q8H  . feeding supplement (ENSURE ENLIVE)  237 mL Oral BID BM  . guaiFENesin  600 mg Oral BID  . insulin aspart  0-5 Units Subcutaneous QHS  . insulin aspart  0-9 Units Subcutaneous TID WC  . insulin aspart  2 Units Subcutaneous TID WC  . insulin glargine  12 Units Subcutaneous Daily  . metoprolol tartrate  12.5 mg Oral BID  . mirtazapine  30 mg Oral QHS    have reviewed scheduled and prn medications.  Physical Exam: General: Not in distress  heart: Regular rate rhythm S1-S2 normal. Lungs: Lungs clear, no wheezing Abdomen: Soft, obese, nontender Extremities: Bandage applied in both legs Dialysis Access: Right IJ non-tunneled HD catheter.  Clean  Dylan [name] 05/29/2017,12:19 PM  LOS: 28 days

## 2017-05-29 NOTE — Progress Notes (Signed)
PROGRESS NOTE    Dylan [name]  ZOX:[id number] DOB: [birth year] DOA: 04/14/2017 PCP: Patient, No Pcp Per   Brief Narrative: Patient is a 52 year old male with past medical history significant for recent quadriplegia, multiple sclerosis, decompression of the spinal abscess, spinal stenosis who has been  admitted for the management of acute kidney injury.  He was recently admitted to neurosurgery on 04/12/2017 and had surgery(corpectomy/decompression surgery) on 03/25/17 for cord compression due to spinal stenosis at C6 ,C5 and C7.  Postoperative course was completed with atrial fibrillation, started on Eliquis but discontinued due to spinal hematoma. Also epidural clot versus abscess was suspected on his follow up  C-spine MRI. Because of the high risk patient decided not to repeat exploratory surgery.  He was also started on IV antibiotics due to complicated UTI ,possible cervical abscess and was discharged to skilled nursing facility on 04/09/2017.   His Foley catheter was not draining at the skilled nursing facility so he was sent to the emergency department.  Found to have acute kidney injury. Urology was also consulted, flushed his Foley with removal of foreign body yeilding 2.5 L of urine. Currently patient has been managed for acute kidney injury thought to be secondary to possible vancomycin related.  Nephrology following.  Patient hospital course is also complicated with sepsis, Candida glabrata fungal anemia, progressive oliguric renal failure, hyperkalemia , thrombocytopenia.  Assessment & Plan:   Principal Problem:   MDD (major depressive disorder), single episode, moderate (HCC) Active Problems:   Uncontrolled diabetes mellitus type 2 without complications (HCC)   Hypertension   Morbid obesity (HCC)   Paroxysmal atrial fibrillation (HCC)   Multiple sclerosis exacerbation (HCC)   Pressure injury of skin   Hyperkalemia   AKI (acute kidney injury) (HCC)   Hematoma   Insomnia  Adjustment reaction with anxiety and depression   Quadriplegia (HCC)   History of fusion of cervical spine   Postoperative wound infection   Coronary artery disease   Atherosclerotic peripheral vascular disease (HCC)   Candidemia (HCC)   Goals of care, counseling/discussion   Palliative care by specialist   Acute lower UTI  Acute renal failure: Thought to be secondary to obstructive uropathy versus vancomycin-induced renal injury.  Nephrology is following.  He has been started on dialysis since 05/07/2017.  He has a temporary dialysis catheter . Plan for permanent dialysis catheter placement.  Vascular surgery consulted by nephrology for the placement of tunneled catheter and AV fistula creation.  Vein mapping done. There was also concern that patient might need stretcher dialysis which [place] be possible [place] of state.But on last dialysis he was able to sit. Creatinine today is 3.9.  His last dialysis was on 05/24/2017 and he was able to be dialyzed in  a chair. He has been planned for dialysis today.  Severe thrombocytopenia: Thought to be secondary to drug related.  Anidulafungin, daptomycin and cefepime stopped.  Protonix also stopped.  HIT negative.  Patient does not receive heparin with dialysis.  Hematology was also consulted .He has received several units of platelets transfusion. Hematology feels that this is not likely immune medicine thrombocytopenia given patient response to transfusion.  We will consult hematology again if his platelet level fall down. Platelets countis 75000 after 2 units of platelets yesterday.  Continue to monitor CBC.  GI bleed/acute blood loss anemia/acute on chronic anemia: Protonix stopped due to thrombocytopenia.  Status post 2 units of PRBC.  No further bleeding noted for last few days. GI on board.  Recommending continue close monitoring.  Recommended to reconsult if rebleeding.  No new episodes of hematochezia .last hemoglobin  7.7 .We will reconsult GI  if  new episode of GI bleed occurs.Monitor CBC  Elevated troponin: Mild.  Did not trend up.  Patient denies any chest pain.  Likely supply demand ischemia  Quadriplegia/cervical abscess versus cervical hematoma/infected cervical hematoma: Patient completed 6 weeks of antibiotics including 4 weeks of vancomycin and cefepime followed by 2 weeks of daptomycin and cefepime.  Course completed on 05/16/2017. Neurosurgery was following before.  ID was following  Neurogenic bladder: Patient was discharged with Foley catheter on 04/09/2017 in the context of new quadriplegia. Foley catheter exhanged on admission. Foley had to be reinserted yesterday because of retention.    XBJ:YNWGNFA of cloudy turbid urine.  Suspicious for UTI.  Start on ceftriaxone.  Will check urine culture.  Previous urine culture grew mixed organisms  Candidemia: Patient had 10 days of  Anidulafungin which has been discontinued due to thrombocytopenia. Ophthalmology also consulted, no evidence of intraocular fungal infection.  Echocardiogram did not show any vegetation.  Diabetes type 2: Continue Lantus, sliding scale insulin.  We will continue monitor blood sugars.  Paroxysmal A. fib: No anticoagulation given his current thrombocytopenia.  Continue amiodarone, metoprolol and Cardizem.  Multiple sclerosis: Recently diagnosed and he was given 5 days of high-dose steroids with improvement.  Neurology consulted.  Recommended to follow as an outpatient.  Patient has braces on bilateral  Forearms to prevent spasticity.  Adjustment disorder/depression: Continue mirtazapine.  Psychiatry was following.  Recommended trazodone.  Hyponatremia/hypokalemia: Currently stable  Unstageable pressure injury on bilateral buttocks: Continue supportive care.  Continue wound care  Nocturnal hypoxia: No history of OSA or the use of CPAP.  Continue supplemental oxygen as needed/CPAP at night.  Goals of care: Palliative care was consulted.  Remains  full code    DVT prophylaxis: SCD Code Status: Full Family Communication: Discussed with [name] on phone few days ago Disposition Plan: Skilled nursing facility after improvement in thrombocytopenia and after starting  Dialysis,AVF placement   Consultants: Nephrology, neurology, psychiatry, infectious disease, ophthalmology, GI  Procedures: Echocardiogram, upper external vein mapping  Antimicrobials: None at the moment  Subjective: Patient seen and examined the bedside this morning.  Remains comfortable.  Denies any issues/complaints.  Foley had to be inserted as of cloudy turbid urine.   looks little depressed  today.  Objective: Vitals:   05/29/17 1130 05/29/17 1200 05/29/17 1205 05/29/17 1230  BP: (!) 101/51 (!) 88/50 108/60 95/67  Pulse: 75 75 74 76  Resp:      Temp:      TempSrc:      SpO2:      Weight:      Height:        Intake/Output Summary (Last 24 hours) at 05/29/2017 1323 Last data filed at 05/29/2017 1300 Gross per 24 hour  Intake 60 ml  Output 451 ml  Net -391 ml   Filed Weights   05/28/17 0500 05/29/17 0357 05/29/17 0930  Weight: 125.2 kg (276 lb) 120.2 kg (265 lb) 120.7 kg (266 lb)    Examination:  General exam: Appears calm and comfortable ,Not in distress,obese HEENT:PERRL,Oral mucosa moist, Ear/Nose normal on gross exam Respiratory system: Bilateral equal air entry, normal vesicular breath sounds, no wheezes or crackles  Cardiovascular system: S1 & S2 heard, RRR. No JVD, murmurs, rubs, gallops or clicks. Gastrointestinal system: Abdomen is nondistended, soft and nontender. No organomegaly or masses felt. Normal bowel  sounds heard. Central nervous system: Alert and oriented.  Quadriplegia Extremities: Trace edema, no clubbing ,no cyanosis, distal peripheral pulses palpable.  Braces on the bilateral upper extremities to prevent  spasticity Skin: Unstageable ulcers on the buttock    Data Reviewed: I have personally reviewed following labs  and imaging studies  CBC: Recent Labs  Lab 05/22/17 2105  05/24/17 0432 05/25/17 0359 05/26/17 0826 05/27/17 0520 05/28/17 0422  WBC 6.4   < > 6.2 5.1 7.1 7.9 7.2  NEUTROABS 5.9  --  4.5  --   --  6.6  --   HGB 6.3*   < > 8.3* 8.2* 8.2* 8.5* 7.7*  HCT 19.2*   < > 24.1* 24.4* 24.1* 25.2* 22.9*  MCV 87.7   < > 85.8 86.8 85.8 86.3 86.4  PLT 47*   < > 46* 31* 25* 33* 75*   < > = values in this interval not displayed.   Basic Metabolic Panel: Recent Labs  Lab 05/25/17 1130 05/26/17 0826 05/27/17 0520 05/28/17 0422 05/29/17 0316  NA 135 135 136 137  136 138  K 4.0 4.3 4.5 4.7  4.7 4.8  CL 100* 98* 100* 95*  100* 99*  CO2 25 28 26 25  25 27   GLUCOSE 154* 148* 146* 173*  176* 216*  BUN 29* 36* 47* 54*  56* 65*  CREATININE 2.82* 3.31* 3.95* 4.42*  4.46* 4.72*  CALCIUM 6.8* 6.9* 7.0* 7.1*  6.9* 6.8*  MG  --   --  1.7  --   --   PHOS 3.6 3.8 3.8 3.9 3.8   GFR: Estimated Creatinine Clearance: 26.6 mL/min (A) (by C-G formula based on SCr of 4.72 mg/dL (H)). Liver Function Tests: Recent Labs  Lab 05/25/17 1130 05/26/17 0826 05/27/17 0520 05/28/17 0422 05/29/17 0316  AST  --   --  25  --   --   ALT  --   --  53  --   --   ALKPHOS  --   --  130*  --   --   BILITOT  --   --  0.8  --   --   PROT  --   --  4.6*  --   --   ALBUMIN 2.0* 2.0* 2.1* 2.1* 2.0*   No results for input(s): LIPASE, AMYLASE in the last 168 hours. No results for input(s): AMMONIA in the last 168 hours. Coagulation Profile: Recent Labs  Lab 05/28/17 0422  INR 1.22   Cardiac Enzymes: Recent Labs  Lab 05/22/17 2117  TROPONINI 0.04*   BNP (last 3 results) No results for input(s): PROBNP in the last 8760 hours. HbA1C: No results for input(s): HGBA1C in the last 72 hours. CBG: Recent Labs  Lab 05/28/17 0836 05/28/17 1206 05/28/17 1642 05/28/17 2146 05/29/17 0619  GLUCAP 177* 164* 181* 187* 218*   Lipid Profile: No results for input(s): CHOL, HDL, LDLCALC, TRIG, CHOLHDL,  LDLDIRECT in the last 72 hours. Thyroid Function Tests: No results for input(s): TSH, T4TOTAL, FREET4, T3FREE, THYROIDAB in the last 72 hours. Anemia Panel: No results for input(s): VITAMINB12, FOLATE, FERRITIN, TIBC, IRON, RETICCTPCT in the last 72 hours. Sepsis Labs: No results for input(s): PROCALCITON, LATICACIDVEN in the last 168 hours.  Recent Results (from the past 240 hour(s))  MRSA PCR Screening     Status: None   Collection Time: 05/20/17 11:31 AM  Result Value Ref Range Status   MRSA by PCR NEGATIVE NEGATIVE Final    Comment:  The GeneXpert MRSA Assay (FDA approved for NASAL specimens only), is one component of a comprehensive MRSA colonization surveillance program. It is not intended to diagnose MRSA infection nor to guide or monitor treatment for MRSA infections. Performed at [facility], [address], [place], [place] [address]          Radiology Studies: No results found.      Scheduled Meds: . amiodarone  200 mg Oral Daily  . collagenase   Topical Daily  . darbepoetin (ARANESP) injection - DIALYSIS  40 mcg Intravenous Once  . dexamethasone  3 mg Oral Q8H  . diltiazem  60 mg Oral Q8H  . feeding supplement (ENSURE ENLIVE)  237 mL Oral BID BM  . guaiFENesin  600 mg Oral BID  . insulin aspart  0-5 Units Subcutaneous QHS  . insulin aspart  0-9 Units Subcutaneous TID WC  . insulin aspart  2 Units Subcutaneous TID WC  . insulin glargine  12 Units Subcutaneous Daily  . metoprolol tartrate  12.5 mg Oral BID  . mirtazapine  30 mg Oral QHS   Continuous Infusions: . sodium chloride    . cefTRIAXone (ROCEPHIN)  IV    . [START ON 05/28/2017] cefUROXime (ZINACEF)  IV    . ferric gluconate (FERRLECIT/NULECIT) IV       LOS: 28 days    Time spent: 25 mins.More than 50% of that time was spent in counseling and/or coordination of care.      Burnadette Pop, MD [facility] Pager [phone]  If 7PM-7AM, please contact  night-coverage [email or web address] Password [email or web address] 05/29/2017, 1:23 PM

## 2017-05-29 NOTE — Progress Notes (Signed)
Patient refused CPAP tonight.  No issues and will address if needed

## 2017-05-29 NOTE — Progress Notes (Signed)
Patients had blood products infused on 4/25 which were not ended correctly I put a stop on them but the Vol. is not indicated, I will attempt to find out how much platelets infused.

## 2017-05-30 LAB — GLUCOSE, CAPILLARY
GLUCOSE-CAPILLARY: 115 mg/dL — AB (ref 65–99)
GLUCOSE-CAPILLARY: 104 mg/dL — AB (ref 65–99)
Glucose-Capillary: 55 mg/dL — ABNORMAL LOW (ref 65–99)
Glucose-Capillary: 103 mg/dL — ABNORMAL HIGH (ref 65–99)
Glucose-Capillary: 107 mg/dL — ABNORMAL HIGH (ref 65–99)

## 2017-05-30 LAB — RENAL FUNCTION PANEL
ALBUMIN: 2 g/dL — AB (ref 3.5–5.0)
Anion gap: 8 (ref 5–15)
BUN: 38 mg/dL — AB (ref 6–20)
CALCIUM: 6.8 mg/dL — AB (ref 8.9–10.3)
CO2: 28 mmol/L (ref 22–32)
Chloride: 100 mmol/L — ABNORMAL LOW (ref 101–111)
Creatinine, Ser: 3.13 mg/dL — ABNORMAL HIGH (ref 0.61–1.24)
GFR calc Af Amer: 25 mL/min — ABNORMAL LOW (ref >60)
GFR calc non Af Amer: 22 mL/min — ABNORMAL LOW (ref >60)
GLUCOSE: 84 mg/dL (ref 65–99)
PHOSPHORUS: 3.4 mg/dL (ref 2.5–4.6)
POTASSIUM: 4.4 mmol/L (ref 3.5–5.1)
SODIUM: 136 mmol/L (ref 135–145)

## 2017-05-30 LAB — CBC WITH DIFFERENTIAL/PLATELET
BASOS PCT: 0 %
Basophils Absolute: 0 10*3/uL (ref 0.0–0.1)
EOS ABS: 0 10*3/uL (ref 0.0–0.7)
Eosinophils Relative: 0 %
HEMATOCRIT: 24.1 % — AB (ref 39.0–52.0)
Hemoglobin: 8.1 g/dL — ABNORMAL LOW (ref 13.0–17.0)
Lymphocytes Relative: 13 %
Lymphs Abs: 1 10*3/uL (ref 0.7–4.0)
MCH: 29.6 pg (ref 26.0–34.0)
MCHC: 33.6 g/dL (ref 30.0–36.0)
MCV: 88 fL (ref 78.0–100.0)
MONOS PCT: 4 %
Monocytes Absolute: 0.3 10*3/uL (ref 0.1–1.0)
NEUTROS ABS: 6.5 10*3/uL (ref 1.7–7.7)
NEUTROS PCT: 83 %
Platelets: 58 10*3/uL — ABNORMAL LOW (ref 150–400)
RBC: 2.74 MIL/uL — ABNORMAL LOW (ref 4.22–5.81)
RDW: 16 % — ABNORMAL HIGH (ref 11.5–15.5)
WBC: 7.9 10*3/uL (ref 4.0–10.5)

## 2017-05-30 MED ORDER — DEXTROSE 50 % IV SOLN
25.0000 mL | Freq: Once | INTRAVENOUS | Status: AC
  Administered 2017-05-30: 25 mL via INTRAVENOUS

## 2017-05-30 MED ORDER — DEXTROSE 50 % IV SOLN
INTRAVENOUS | Status: AC
  Filled 2017-05-30: qty 50

## 2017-05-30 MED ORDER — SODIUM CHLORIDE 0.9 % IV SOLN: Freq: Once | INTRAVENOUS | Status: AC

## 2017-05-30 NOTE — Progress Notes (Signed)
Hypoglycemic Event  CBG: 55  Treatment: 25 ml of 50% Dextrose Symptoms: Patient was Asymptomatic  Follow-up CBG: Time: 0644 CBG Result:115  Possible Reasons for Event: Poor intake Comments/MD notified: Ok'd the 50% dextrose    Georga Hacking

## 2017-05-30 NOTE — Progress Notes (Addendum)
    Preoperative Note   Procedure: R arm AVF, TDC placement  Date: 05/30/17  Preoperative diagnosis: Acute renal failure  Consent: ordered  Laboratory:  CBC:    Component Value Date/Time   WBC 7.2 05/28/2017 0422   RBC 2.65 (L) 05/28/2017 0422   HGB 7.7 (L) 05/28/2017 0422   HCT 22.9 (L) 05/28/2017 0422   PLT 75 (L) 05/28/2017 0422   MCV 86.4 05/28/2017 0422   MCH 29.1 05/28/2017 0422   MCHC 33.6 05/28/2017 0422   RDW 15.7 (H) 05/28/2017 0422   LYMPHSABS 1.1 05/27/2017 0520   MONOABS 0.2 05/27/2017 0520   EOSABS 0.0 05/27/2017 0520   BASOSABS 0.0 05/27/2017 0520    BMP:    Component Value Date/Time   NA 138 05/29/2017 0316   NA 133 (A) 04/30/2017   K 4.8 05/29/2017 0316   CL 99 (L) 05/29/2017 0316   CO2 27 05/29/2017 0316   GLUCOSE 216 (H) 05/29/2017 0316   BUN 65 (H) 05/29/2017 0316   BUN 83 (A) 04/30/2017   CREATININE 4.72 (H) 05/29/2017 0316   CALCIUM 6.8 (L) 05/29/2017 0316   GFRNONAA 13 (L) 05/29/2017 0316   GFRAA 15 (L) 05/29/2017 0316    Coagulation: Lab Results  Component Value Date   INR 1.22 05/28/2017   INR 1.21 05/22/2017   INR 1.27 05/12/2017   No results found for: PTT  - Repeat CBC and BMP in AM - 1 six-pack Platelets prepared for transfusion if needed intraop   Leonides Sake, MD, FACS [facility] Office: [phone] Pager: [phone]  05/30/2017, 8:12 AM

## 2017-05-30 NOTE — Progress Notes (Signed)
PROGRESS NOTE    Dylan [name]  ZOX:[id number] DOB: 09/01/1965 DOA: 04/14/2017 PCP: Patient, No Pcp Per   Brief Narrative: Patient is a 52 year old male with past medical history significant for recent quadriplegia, multiple sclerosis, decompression of the spinal abscess, spinal stenosis who has been  admitted for the management of acute kidney injury.  He was recently admitted to neurosurgery on 04/12/2017 and had surgery(corpectomy/decompression surgery) on 03/25/17 for cord compression due to spinal stenosis at C6 ,C5 and C7.  Postoperative course was completed with atrial fibrillation, started on Eliquis but discontinued due to spinal hematoma. Also epidural clot versus abscess was suspected on his follow up  C-spine MRI. Because of the high risk patient decided not to repeat exploratory surgery.  He was also started on IV antibiotics due to complicated UTI ,possible cervical abscess and was discharged to skilled nursing facility on 04/09/2017.   His Foley catheter was not draining at the skilled nursing facility so he was sent to the emergency department.  Found to have acute kidney injury. Urology was also consulted, flushed his Foley with removal of foreign body yeilding 2.5 L of urine. Currently patient has been managed for acute kidney injury thought to be secondary to possible vancomycin related.  Nephrology following.  Patient hospital course is also complicated with sepsis, Candida glabrata fungal anemia, progressive oliguric renal failure, hyperkalemia , thrombocytopenia.  Assessment & Plan:   Principal Problem:   MDD (major depressive disorder), single episode, moderate (HCC) Active Problems:   Uncontrolled diabetes mellitus type 2 without complications (HCC)   Hypertension   Morbid obesity (HCC)   Paroxysmal atrial fibrillation (HCC)   Multiple sclerosis exacerbation (HCC)   Pressure injury of skin   Hyperkalemia   AKI (acute kidney injury) (HCC)   Hematoma   Insomnia  Adjustment reaction with anxiety and depression   Quadriplegia (HCC)   History of fusion of cervical spine   Postoperative wound infection   Coronary artery disease   Atherosclerotic peripheral vascular disease (HCC)   Candidemia (HCC)   Goals of care, counseling/discussion   Palliative care by specialist   Acute lower UTI  Acute renal failure: Thought to be secondary to obstructive uropathy versus vancomycin-induced renal injury.  Nephrology is following.  He has been started on dialysis since 05/07/2017.  He has a temporary dialysis catheter . Plan for permanent dialysis catheter placement.  Vascular surgery consulted by nephrology for the placement of tunneled catheter and AV fistula creation.  Vein mapping done. There was also concern that patient might need stretcher dialysis which would be possible only out of state.But recently, he has been  able to sit. Creatinine today is 3.13.   He was dialyzed yesterday.  Severe thrombocytopenia: Thought to be secondary to drug related.  Anidulafungin, daptomycin and cefepime stopped.  Protonix also stopped.  HIT negative.  Patient does not receive heparin with dialysis.  Hematology was also consulted .He has received several units of platelets transfusion. Hematology feels that this is not likely immune medicine thrombocytopenia given patient response to transfusion.  We will consult hematology again if his platelet level fall down. Platelets level 58000 today.  Continue to monitor CBC.  GI bleed/acute blood loss anemia/acute on chronic anemia: Protonix stopped due to thrombocytopenia.  Status post 2 units of PRBC.  No further bleeding noted for last few days. GI on board.  Recommending continue close monitoring.  Recommended to reconsult if rebleeding.  No new episodes of hematochezia . hemoglobin  Today is 8.1 .We  will reconsult GI if  new episode of GI bleed occurs.Monitor CBC  Elevated troponin: Mild.  Did not trend up.  Patient denies any  chest pain.  Likely supply demand ischemia  Quadriplegia/cervical abscess versus cervical hematoma/infected cervical hematoma: Patient completed 6 weeks of antibiotics including 4 weeks of vancomycin and cefepime followed by 2 weeks of daptomycin and cefepime.  Course completed on 05/16/2017. Neurosurgery was following before.  ID was following  Neurogenic bladder: Patient was discharged with Foley catheter on 04/09/2017 in the context of new quadriplegia. Foley catheter exhanged on admission. Foley had to be reinserted  because of retention.    UTI: Urine culture growing gram-negative rods.  Continue ceftriaxone.  Candidemia: Patient had 10 days of  Anidulafungin which has been discontinued due to thrombocytopenia. Ophthalmology also consulted, no evidence of intraocular fungal infection.  Echocardiogram did not show any vegetation.  Diabetes type 2: Continue Lantus, sliding scale insulin.  We will continue monitor blood sugars.  Paroxysmal A. fib: No anticoagulation given his current thrombocytopenia.  Continue amiodarone, metoprolol and Cardizem.  Multiple sclerosis: Recently diagnosed and he was given 5 days of high-dose steroids with improvement.  Neurology consulted.  Recommended to follow as an outpatient.  Patient has braces on bilateral  Forearms to prevent spasticity.  Adjustment disorder/depression: Continue mirtazapine.  Psychiatry was following.  Recommended trazodone.  Hyponatremia/hypokalemia: Currently stable  Unstageable pressure injury on bilateral buttocks: Continue supportive care.  Continue wound care  Nocturnal hypoxia: No history of OSA or the use of CPAP.  Continue supplemental oxygen as needed/CPAP at night.  Goals of care: Palliative care was consulted.  Remains full code    DVT prophylaxis: SCD Code Status: Full Family Communication: Discussed with Dylan [name] on phone few days ago Disposition Plan: Skilled nursing facility after improvement in  thrombocytopenia and after starting  Dialysis,AVF placement   Consultants: Nephrology, neurology, psychiatry, infectious disease, ophthalmology, GI  Procedures: Echocardiogram, upper external vein mapping  Antimicrobials: None at the moment  Subjective: Patient seen and examined at bedside this morning.  No new issues/events.  Remains comfortable.   No overnight fever, nausea or vomiting.  Objective: Vitals:   05/30/17 0350 05/30/17 0601 05/30/17 0907 05/30/17 0912  BP: 126/81  (!) 142/94 (!) 142/94  Pulse: (!) 56  62 64  Resp: 18   13  Temp: 97.6 F (36.4 C)   (!) 97.5 F (36.4 C)  TempSrc: Oral   Oral  SpO2: 94%   100%  Weight:  119.7 kg (264 lb)    Height:        Intake/Output Summary (Last 24 hours) at 05/30/2017 1046 Last data filed at 05/30/2017 0300 Gross per 24 hour  Intake 220 ml  Output 1617 ml  Net -1397 ml   Filed Weights   05/29/17 0930 05/29/17 1306 05/30/17 0601  Weight: 120.7 kg (266 lb) 118.9 kg (262 lb 2 oz) 119.7 kg (264 lb)    Examination:  General exam: Appears calm and comfortable ,Not in distress,obese HEENT:PERRL,Oral mucosa moist, Ear/Nose normal on gross exam Respiratory system: Bilateral equal air entry, normal vesicular breath sounds, no wheezes or crackles  Cardiovascular system: S1 & S2 heard, RRR. No JVD, murmurs, rubs, gallops or clicks. Gastrointestinal system: Abdomen is nondistended, soft and nontender. No organomegaly or masses felt. Normal bowel sounds heard. Central nervous system: Alert and oriented.  Quadriplegia Extremities: Trace edema, no clubbing ,no cyanosis, distal peripheral pulses palpable.  Braces on the bilateral upper extremities to prevent  spasticity Skin: Unstageable  ulcers on the buttock    Data Reviewed: I have personally reviewed following labs and imaging studies  CBC: Recent Labs  Lab 05/24/17 0432 05/25/17 0359 05/26/17 0826 05/27/17 0520 05/28/17 0422 05/30/17 0811  WBC 6.2 5.1 7.1 7.9 7.2 7.9    NEUTROABS 4.5  --   --  6.6  --  6.5  HGB 8.3* 8.2* 8.2* 8.5* 7.7* 8.1*  HCT 24.1* 24.4* 24.1* 25.2* 22.9* 24.1*  MCV 85.8 86.8 85.8 86.3 86.4 88.0  PLT 46* 31* 25* 33* 75* 58*   Basic Metabolic Panel: Recent Labs  Lab 05/26/17 0826 05/27/17 0520 05/28/17 0422 05/29/17 0316 05/30/17 0811  NA 135 136 137  136 138 136  K 4.3 4.5 4.7  4.7 4.8 4.4  CL 98* 100* 95*  100* 99* 100*  CO2 28 26 25  25 27 28   GLUCOSE 148* 146* 173*  176* 216* 84  BUN 36* 47* 54*  56* 65* 38*  CREATININE 3.31* 3.95* 4.42*  4.46* 4.72* 3.13*  CALCIUM 6.9* 7.0* 7.1*  6.9* 6.8* 6.8*  MG  --  1.7  --   --   --   PHOS 3.8 3.8 3.9 3.8 3.4   GFR: Estimated Creatinine Clearance: 39.9 mL/min (A) (by C-G formula based on SCr of 3.13 mg/dL (H)). Liver Function Tests: Recent Labs  Lab 05/26/17 0826 05/27/17 0520 05/28/17 0422 05/29/17 0316 05/30/17 0811  AST  --  25  --   --   --   ALT  --  53  --   --   --   ALKPHOS  --  130*  --   --   --   BILITOT  --  0.8  --   --   --   PROT  --  4.6*  --   --   --   ALBUMIN 2.0* 2.1* 2.1* 2.0* 2.0*   No results for input(s): LIPASE, AMYLASE in the last 168 hours. No results for input(s): AMMONIA in the last 168 hours. Coagulation Profile: Recent Labs  Lab 05/28/17 0422  INR 1.22   Cardiac Enzymes: No results for input(s): CKTOTAL, CKMB, CKMBINDEX, TROPONINI in the last 168 hours. BNP (last 3 results) No results for input(s): PROBNP in the last 8760 hours. HbA1C: No results for input(s): HGBA1C in the last 72 hours. CBG: Recent Labs  Lab 05/29/17 1423 05/29/17 1654 05/29/17 2136 05/30/17 0602 05/30/17 0644  GLUCAP 128* 164* 101* 55* 115*   Lipid Profile: No results for input(s): CHOL, HDL, LDLCALC, TRIG, CHOLHDL, LDLDIRECT in the last 72 hours. Thyroid Function Tests: No results for input(s): TSH, T4TOTAL, FREET4, T3FREE, THYROIDAB in the last 72 hours. Anemia Panel: No results for input(s): VITAMINB12, FOLATE, FERRITIN, TIBC, IRON,  RETICCTPCT in the last 72 hours. Sepsis Labs: No results for input(s): PROCALCITON, LATICACIDVEN in the last 168 hours.  Recent Results (from the past 240 hour(s))  MRSA PCR Screening     Status: None   Collection Time: 05/20/17 11:31 AM  Result Value Ref Range Status   MRSA by PCR NEGATIVE NEGATIVE Final    Comment:        The GeneXpert MRSA Assay (FDA approved for NASAL specimens only), is one component of a comprehensive MRSA colonization surveillance program. It is not intended to diagnose MRSA infection nor to guide or monitor treatment for MRSA infections. Performed at [facility], [address] [place], [place] [address]   Culture, Urine     Status: Abnormal (Preliminary result)  Collection Time: 05/29/17  3:38 AM  Result Value Ref Range Status   Specimen Description URINE, CATHETERIZED  Final   Special Requests   Final    NONE Performed at [facility], [address], [place], [place] [address]    Culture >=100,000 COLONIES/mL GRAM NEGATIVE RODS (A)  Final   Report Status PENDING  Incomplete         Radiology Studies: No results found.      Scheduled Meds: . dextrose      . amiodarone  200 mg Oral Daily  . collagenase   Topical Daily  . dexamethasone  3 mg Oral Q8H  . diltiazem  60 mg Oral Q8H  . feeding supplement (ENSURE ENLIVE)  237 mL Oral BID BM  . guaiFENesin  600 mg Oral BID  . insulin aspart  0-5 Units Subcutaneous QHS  . insulin aspart  0-9 Units Subcutaneous TID WC  . insulin aspart  2 Units Subcutaneous TID WC  . insulin glargine  12 Units Subcutaneous Daily  . metoprolol tartrate  12.5 mg Oral BID  . mirtazapine  30 mg Oral QHS   Continuous Infusions: . sodium chloride    . sodium chloride    . sodium chloride    . sodium chloride    . cefTRIAXone (ROCEPHIN)  IV 1 g (05/29/17 1440)  . [START ON 05/21/2017] cefUROXime (ZINACEF)  IV       LOS: 29 days    Time spent: 25 mins.More than 50% of that time was spent in  counseling and/or coordination of care.      Burnadette Pop, MD [facility] Pager [phone]  If 7PM-7AM, please contact night-coverage [email or web address] Password [facility] 05/30/2017, 10:46 AM

## 2017-05-30 NOTE — H&P (View-Only) (Signed)
    Preoperative Note   Procedure: R arm AVF, TDC placement  Date: 05/30/17  Preoperative diagnosis: Acute renal failure  Consent: ordered  Laboratory:  CBC:    Component Value Date/Time   WBC 7.2 05/28/2017 0422   RBC 2.65 (L) 05/28/2017 0422   HGB 7.7 (L) 05/28/2017 0422   HCT 22.9 (L) 05/28/2017 0422   PLT 75 (L) 05/28/2017 0422   MCV 86.4 05/28/2017 0422   MCH 29.1 05/28/2017 0422   MCHC 33.6 05/28/2017 0422   RDW 15.7 (H) 05/28/2017 0422   LYMPHSABS 1.1 05/27/2017 0520   MONOABS 0.2 05/27/2017 0520   EOSABS 0.0 05/27/2017 0520   BASOSABS 0.0 05/27/2017 0520    BMP:    Component Value Date/Time   NA 138 05/29/2017 0316   NA 133 (A) 04/30/2017   K 4.8 05/29/2017 0316   CL 99 (L) 05/29/2017 0316   CO2 27 05/29/2017 0316   GLUCOSE 216 (H) 05/29/2017 0316   BUN 65 (H) 05/29/2017 0316   BUN 83 (A) 04/30/2017   CREATININE 4.72 (H) 05/29/2017 0316   CALCIUM 6.8 (L) 05/29/2017 0316   GFRNONAA 13 (L) 05/29/2017 0316   GFRAA 15 (L) 05/29/2017 0316    Coagulation: Lab Results  Component Value Date   INR 1.22 05/28/2017   INR 1.21 05/22/2017   INR 1.27 05/12/2017   No results found for: PTT  - Repeat CBC and BMP in AM - 1 six-pack Platelets prepared for transfusion if needed intraop   Eain Mullendore, MD, FACS [facility] Office: [phone] Pager: [phone]  05/30/2017, 8:12 AM   

## 2017-05-30 NOTE — Progress Notes (Signed)
Occupational Therapy Treatment Patient Details Name: Dylan [name] MRN: [id number] DOB: December 03, 1965 Today's Date: 05/30/2017    History of present illness Dylan [name] is a 52yo black male who comes to [facility] on 3/30 with ABD pain and hypoglycemia, admitted with cervical abcess v hematoma s/p C five-seven surgery on . 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 He DC to SNF ~3WA with motor weakness C7 down, full sensation intact. He reports graual worsening of manipulative use with smartphone over those three weaks.     OT comments  Pt. Agreeable to participation in skilled OT.  Focus of session was pt.s ability to guide and direct care during ADLS, and also proper tech. For don/doff WHO.  Pt. Tolerated BUE ROM.  Cont. With current poc for next tx. Sessions with self feeding, HEP, and pt. Guiding his care.    Follow Up Recommendations  SNF;Supervision/Assistance - 24 hour    Equipment Recommendations  Wheelchair (measurements OT);Wheelchair cushion (measurements OT)    Recommendations for Other Services      Precautions / Restrictions Precautions Precautions: Fall;Cervical;Other (comment) Other Brace/Splint: B resting hand splints for night use only; B WHO to use PRN during the day       Mobility Bed Mobility               General bed mobility comments: TOTAL A  to scoot up in bed, (no assistance available so utilized bed positioning and pad to pull pt. to [facility] and also rolling L/R for removal of soiled pad and to clean pt.s back and matress)  Transfers                      Balance                                           ADL either performed or assessed with clinical judgement   ADL Overall ADL's : Needs assistance/impaired   Eating/Feeding Details (indicate cue type and reason): pt. had  finished eating upon my arrival into room but was able to self direct the removal of WHO as part of goals for directing and guiding caregivers                                   General ADL Comments: pt. found to have spilled ensure shake in the bed and all of the R side of pt., cont. work on OT stated goals for pt. to guide and direct personal care during ADL.  had pt. provide inst. to therapist asst. each step of bed positioning, rolling, doff/don soiled gown, pillow placement and bed positioniong at end of session.       Vision       Perception     Praxis      Cognition Arousal/Alertness: Awake/alert Behavior During Therapy: WFL for tasks assessed/performed Overall Cognitive Status: Within Functional Limits for tasks assessed                                          Exercises General Exercises - Upper Extremity Elbow  Flexion: Both;Strengthening;Supine;5 reps Elbow Extension: AAROM;Both;Supine;5 reps   Shoulder Instructions       General Comments      Pertinent Vitals/ Pain       Pain Assessment: No/denies pain  Home Living                                          Prior Functioning/Environment              Frequency  Min 3X/week        Progress Toward Goals  OT Goals(current goals can now be found in the care plan section)  Progress towards OT goals: Progressing toward goals     Plan Discharge plan remains appropriate;Frequency needs to be updated    Co-evaluation                 AM-PAC PT "6 Clicks" Daily Activity     Outcome Measure   Help from another person eating meals?: A Lot Help from another person taking care of personal grooming?: A Lot Help from another person toileting, which includes using toliet, bedpan, or urinal?: Total Help from another person bathing (including washing, rinsing, drying)?: A Lot Help from another person to put on and taking off regular upper body clothing?:  Total Help from another person to put on and taking off regular lower body clothing?: Total 6 Click Score: 9    End of Session    OT Visit Diagnosis: Muscle weakness (generalized) (M62.81);Other symptoms and signs involving the nervous system (R29.898)   Activity Tolerance Patient tolerated treatment well   Patient Left in bed;with call bell/phone within reach(placed touch callbell near right hand as pt. has more active movement and was able to activate it when we practiced before i left the room)   Nurse Communication Other (comment)(notified that IV was beeping and needed to be checked)        Time: 1031-1057 OT Time Calculation (min): 26 min  Charges: OT General Charges $OT Visit: 1 Visit OT Treatments $Self Care/Home Management : 23-37 mins   Robet Leu, COTA/L 05/30/2017, 11:41 AM

## 2017-05-30 NOTE — Progress Notes (Signed)
Patient refused CPAP for tonight 

## 2017-05-30 NOTE — Progress Notes (Signed)
**Dylan Dylan** Dylan Dylan  Assessment/ Plan: Pt is a 52 y.o. 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 fib, diabetes, morbid obesity, CKD with baseline creatinine around 1.6, admitted for acute kidney injury, obstructive uropathy and hyperkalemia requiring intermittent hemodialysis.  Assessment/Plan:  #Acute kidney injury due to ATN from supratherapeutic vancomycin.  Patient is now getting intermittent hemodialysis via temporary catheter. Last hemodialysis on 4/27, tolerated well.   Vein mapping was done.  Patient with very minimal urine output and no sign of renal recovery. Plan for tunnel catheter and creation of AVF tomorrow by vascular team. Another dialysis on Tuesday.  Monitor CBC.  #Neurogenic bladder/acute urinary retention secondary due to blockage of Foley catheter, underwent exchange by urology.  Now in IV abx for UTI.  #Normocytic anemia: Iron saturation 19% with elevated ferritin level of 1689.  Hemoglobin is low. Received IV iron during HD. Hb 8.1  #Severe thrombocytopenia thought to be due to drug-related/non-immune mediated.  Evaluated by hematologist.  Received 2 units of platelet on 4/26, plt 58 today. May need transfusion tomorrow before OR, d/w primary team.   #Candidemia: Patient completed 10 days of anidulafungin.  #Proximal atrial fibrillation: On amiodarone, metoprolol and diltiazem.  No anticoagulation.  #Hypertension: Blood pressure acceptable.  Continue to monitor.  Subjective: Seen and examined at bedside.  No chest pain, SOB, n/v/d  Objective Vital signs in last 24 hours: Vitals:   05/30/17 0350 05/30/17 0601 05/30/17 0907 05/30/17 0912  BP: 126/81  (!) 142/94 (!) 142/94  Pulse: (!) 56  62 64  Resp: 18   13  Temp: 97.6 F (36.4 C)   (!) 97.5 F (36.4 C)  TempSrc: Oral   Oral  SpO2: 94%   100%  Weight:  119.7 kg (264 lb)    Height:       Weight change: 0.454 kg (1 lb)  Intake/Output Summary  (Last 24 hours) at 05/30/2017 1225 Last data filed at 05/30/2017 0300 Gross per 24 hour  Intake 220 ml  Output 1617 ml  Net -1397 ml       Labs: Basic Metabolic Panel: Recent Labs  Lab 05/28/17 0422 05/29/17 0316 05/30/17 0811  NA 137  136 138 136  K 4.7  4.7 4.8 4.4  CL 95*  100* 99* 100*  CO2 25  25 27 28   GLUCOSE 173*  176* 216* 84  BUN 54*  56* 65* 38*  CREATININE 4.42*  4.46* 4.72* 3.13*  CALCIUM 7.1*  6.9* 6.8* 6.8*  PHOS 3.9 3.8 3.4   Liver Function Tests: Recent Labs  Lab 05/27/17 0520 05/28/17 0422 05/29/17 0316 05/30/17 0811  AST 25  --   --   --   ALT 53  --   --   --   ALKPHOS 130*  --   --   --   BILITOT 0.8  --   --   --   PROT 4.6*  --   --   --   ALBUMIN 2.1* 2.1* 2.0* 2.0*   No results for input(s): LIPASE, AMYLASE in the last 168 hours. No results for input(s): AMMONIA in the last 168 hours. CBC: Recent Labs  Lab 05/24/17 0432 05/25/17 0359 05/26/17 0826 05/27/17 0520 05/28/17 0422 05/30/17 0811  WBC 6.2 5.1 7.1 7.9 7.2 7.9  NEUTROABS 4.5  --   --  6.6  --  6.5  HGB 8.3* 8.2* 8.2* 8.5* 7.7* 8.1*  HCT 24.1* 24.4* 24.1* 25.2* 22.9* 24.1*  MCV  85.8 86.8 85.8 86.3 86.4 88.0  PLT 46* 31* 25* 33* 75* 58*   Cardiac Enzymes: No results for input(s): CKTOTAL, CKMB, CKMBINDEX, TROPONINI in the last 168 hours. CBG: Recent Labs  Lab 05/29/17 1654 05/29/17 2136 05/30/17 0602 05/30/17 0644 05/30/17 1124  GLUCAP 164* 101* 55* 115* 104*    Iron Studies:  No results for input(s): IRON, TIBC, TRANSFERRIN, FERRITIN in the last 72 hours. Studies/Results: No results found.  Medications: Infusions: . sodium chloride    . sodium chloride    . sodium chloride    . sodium chloride    . cefTRIAXone (ROCEPHIN)  IV 1 g (05/29/17 1440)  . [START ON 06/20/17] cefUROXime (ZINACEF)  IV      Scheduled Medications: . amiodarone  200 mg Oral Daily  . collagenase   Topical Daily  . dexamethasone  3 mg Oral Q8H  . dextrose      .  diltiazem  60 mg Oral Q8H  . feeding supplement (ENSURE ENLIVE)  237 mL Oral BID BM  . guaiFENesin  600 mg Oral BID  . insulin aspart  0-5 Units Subcutaneous QHS  . insulin aspart  0-9 Units Subcutaneous TID WC  . insulin aspart  2 Units Subcutaneous TID WC  . insulin glargine  12 Units Subcutaneous Daily  . metoprolol tartrate  12.5 mg Oral BID  . mirtazapine  30 mg Oral QHS    have reviewed scheduled and prn medications.  Physical Exam: General: NAD, able to lie flat on bed heart: Regular rate rhythm S1-S2 normal. Lungs:lungs clear, no wheeze Abdomen: Soft, obese, nontender Extremities: Bandage applied in both legs Dialysis Access: Right IJ non-tunneled HD catheter.  Clean  Dron Jaynie Collins 05/30/2017,12:25 PM  LOS: 29 days

## 2017-05-31 ENCOUNTER — Inpatient Hospital Stay (HOSPITAL_COMMUNITY): Payer: Medicaid Other

## 2017-05-31 ENCOUNTER — Inpatient Hospital Stay (HOSPITAL_COMMUNITY): Payer: Medicaid Other | Admitting: Certified Registered"

## 2017-05-31 ENCOUNTER — Encounter (HOSPITAL_COMMUNITY): Payer: Self-pay | Admitting: Certified Registered"

## 2017-05-31 ENCOUNTER — Telehealth: Payer: Self-pay | Admitting: Vascular Surgery

## 2017-05-31 ENCOUNTER — Encounter (HOSPITAL_COMMUNITY): Admission: EM | Disposition: E | Payer: Self-pay | Source: Home / Self Care | Attending: Family Medicine

## 2017-05-31 HISTORY — PX: INSERTION OF DIALYSIS CATHETER: SHX1324

## 2017-05-31 HISTORY — PX: AV FISTULA PLACEMENT: SHX1204

## 2017-05-31 LAB — CBC WITH DIFFERENTIAL/PLATELET
BASOS PCT: 0 %
Basophils Absolute: 0 10*3/uL (ref 0.0–0.1)
EOS ABS: 0 10*3/uL (ref 0.0–0.7)
EOS PCT: 0 %
HEMATOCRIT: 24.2 % — AB (ref 39.0–52.0)
HEMOGLOBIN: 7.9 g/dL — AB (ref 13.0–17.0)
LYMPHS PCT: 13 %
Lymphs Abs: 0.9 10*3/uL (ref 0.7–4.0)
MCH: 28.4 pg (ref 26.0–34.0)
MCHC: 32.6 g/dL (ref 30.0–36.0)
MCV: 87.1 fL (ref 78.0–100.0)
MONOS PCT: 7 %
Monocytes Absolute: 0.5 10*3/uL (ref 0.1–1.0)
NEUTROS ABS: 5.5 10*3/uL (ref 1.7–7.7)
NEUTROS PCT: 80 %
Platelets: 43 10*3/uL — ABNORMAL LOW (ref 150–400)
RBC: 2.78 MIL/uL — ABNORMAL LOW (ref 4.22–5.81)
RDW: 15.9 % — ABNORMAL HIGH (ref 11.5–15.5)
WBC Morphology: INCREASED
WBC: 6.9 10*3/uL (ref 4.0–10.5)

## 2017-05-31 LAB — BASIC METABOLIC PANEL
ANION GAP: 10 (ref 5–15)
BUN: 44 mg/dL — ABNORMAL HIGH (ref 6–20)
CALCIUM: 6.7 mg/dL — AB (ref 8.9–10.3)
CO2: 27 mmol/L (ref 22–32)
CREATININE: 3.54 mg/dL — AB (ref 0.61–1.24)
Chloride: 101 mmol/L (ref 101–111)
GFR calc non Af Amer: 19 mL/min — ABNORMAL LOW (ref >60)
GFR, EST AFRICAN AMERICAN: 22 mL/min — AB (ref >60)
Glucose, Bld: 113 mg/dL — ABNORMAL HIGH (ref 65–99)
Potassium: 4.3 mmol/L (ref 3.5–5.1)
SODIUM: 138 mmol/L (ref 135–145)

## 2017-05-31 LAB — GLUCOSE, CAPILLARY
GLUCOSE-CAPILLARY: 106 mg/dL — AB (ref 65–99)
GLUCOSE-CAPILLARY: 112 mg/dL — AB (ref 65–99)
Glucose-Capillary: 118 mg/dL — ABNORMAL HIGH (ref 65–99)
Glucose-Capillary: 196 mg/dL — ABNORMAL HIGH (ref 65–99)

## 2017-05-31 LAB — PROTIME-INR
INR: 1.31
PROTHROMBIN TIME: 16.2 s — AB (ref 11.4–15.2)

## 2017-05-31 SURGERY — INSERTION OF DIALYSIS CATHETER
Anesthesia: General | Site: Neck | Laterality: Right

## 2017-05-31 MED ORDER — FENTANYL CITRATE (PF) 100 MCG/2ML IJ SOLN: 25.0000 ug | INTRAMUSCULAR | Status: DC | PRN

## 2017-05-31 MED ORDER — DILTIAZEM HCL ER COATED BEADS 120 MG PO CP24
120.0000 mg | ORAL_CAPSULE | Freq: Every day | ORAL | Status: DC
  Administered 2017-05-31 – 2017-06-10 (×11): 120 mg via ORAL
  Filled 2017-05-31 (×12): qty 1

## 2017-05-31 MED ORDER — LIDOCAINE-EPINEPHRINE (PF) 1 %-1:200000 IJ SOLN
INTRAMUSCULAR | Status: AC
  Filled 2017-05-31: qty 30

## 2017-05-31 MED ORDER — 0.9 % SODIUM CHLORIDE (POUR BTL) OPTIME
TOPICAL | Status: DC | PRN
  Administered 2017-05-31: 1000 mL

## 2017-05-31 MED ORDER — DEXAMETHASONE SODIUM PHOSPHATE 10 MG/ML IJ SOLN
INTRAMUSCULAR | Status: DC | PRN
  Administered 2017-05-31: 5 mg via INTRAVENOUS

## 2017-05-31 MED ORDER — LIDOCAINE HCL (PF) 1 % IJ SOLN
INTRAMUSCULAR | Status: AC
  Filled 2017-05-31: qty 30

## 2017-05-31 MED ORDER — SODIUM CHLORIDE 0.9 % IV SOLN
125.0000 mg | INTRAVENOUS | Status: DC
  Filled 2017-05-31: qty 10

## 2017-05-31 MED ORDER — SODIUM CHLORIDE 0.9 % IV SOLN: Freq: Once | INTRAVENOUS | Status: DC

## 2017-05-31 MED ORDER — SODIUM CHLORIDE 0.9 % IV SOLN
INTRAVENOUS | Status: AC
  Filled 2017-05-31: qty 1.5

## 2017-05-31 MED ORDER — HEPARIN SODIUM (PORCINE) 5000 UNIT/ML IJ SOLN
INTRAMUSCULAR | Status: DC | PRN
  Administered 2017-05-31: 10:00:00

## 2017-05-31 MED ORDER — RENA-VITE PO TABS
1.0000 | ORAL_TABLET | Freq: Every day | ORAL | Status: DC
  Administered 2017-05-31 – 2017-06-10 (×11): 1 via ORAL
  Filled 2017-05-31 (×12): qty 1

## 2017-05-31 MED ORDER — DARBEPOETIN ALFA 200 MCG/0.4ML IJ SOSY: 200.0000 ug | PREFILLED_SYRINGE | INTRAMUSCULAR | Status: DC

## 2017-05-31 MED ORDER — MIDAZOLAM HCL 2 MG/2ML IJ SOLN
INTRAMUSCULAR | Status: AC
  Filled 2017-05-31: qty 2

## 2017-05-31 MED ORDER — SODIUM CHLORIDE 0.9 % IV SOLN
INTRAVENOUS | Status: AC
  Filled 2017-05-31: qty 1.2

## 2017-05-31 MED ORDER — THROMBIN 20000 UNITS EX SOLR
CUTANEOUS | Status: AC
  Filled 2017-05-31: qty 20000

## 2017-05-31 MED ORDER — PROPOFOL 10 MG/ML IV BOLUS
INTRAVENOUS | Status: DC | PRN
  Administered 2017-05-31: 150 mg via INTRAVENOUS

## 2017-05-31 MED ORDER — HEPARIN SODIUM (PORCINE) 1000 UNIT/ML IJ SOLN
INTRAMUSCULAR | Status: AC
  Filled 2017-05-31: qty 1

## 2017-05-31 MED ORDER — PHENYLEPHRINE HCL 10 MG/ML IJ SOLN
INTRAVENOUS | Status: DC | PRN
  Administered 2017-05-31: 50 ug/min via INTRAVENOUS

## 2017-05-31 MED ORDER — EPHEDRINE SULFATE-NACL 50-0.9 MG/10ML-% IV SOSY
PREFILLED_SYRINGE | INTRAVENOUS | Status: DC | PRN
  Administered 2017-05-31 (×2): 10 mg via INTRAVENOUS

## 2017-05-31 MED ORDER — LIDOCAINE HCL (PF) 1 % IJ SOLN
INTRAMUSCULAR | Status: DC | PRN
  Administered 2017-05-31: 30 mL

## 2017-05-31 MED ORDER — ONDANSETRON HCL 4 MG/2ML IJ SOLN
INTRAMUSCULAR | Status: DC | PRN
  Administered 2017-05-31: 4 mg via INTRAVENOUS

## 2017-05-31 MED ORDER — HEPARIN SODIUM (PORCINE) 1000 UNIT/ML IJ SOLN
INTRAMUSCULAR | Status: DC | PRN
  Administered 2017-05-31: 1000 [IU] via INTRAVENOUS

## 2017-05-31 MED ORDER — PAPAVERINE HCL 30 MG/ML IJ SOLN
INTRAMUSCULAR | Status: AC
  Filled 2017-05-31: qty 2

## 2017-05-31 MED ORDER — LIDOCAINE 2% (20 MG/ML) 5 ML SYRINGE
INTRAMUSCULAR | Status: DC | PRN
  Administered 2017-05-31: 60 mg via INTRAVENOUS

## 2017-05-31 MED ORDER — PHENYLEPHRINE 40 MCG/ML (10ML) SYRINGE FOR IV PUSH (FOR BLOOD PRESSURE SUPPORT)
PREFILLED_SYRINGE | INTRAVENOUS | Status: DC | PRN
  Administered 2017-05-31: 120 ug via INTRAVENOUS

## 2017-05-31 MED ORDER — PROPOFOL 10 MG/ML IV BOLUS
INTRAVENOUS | Status: AC
  Filled 2017-05-31: qty 20

## 2017-05-31 MED ORDER — FENTANYL CITRATE (PF) 250 MCG/5ML IJ SOLN
INTRAMUSCULAR | Status: AC
  Filled 2017-05-31: qty 5

## 2017-05-31 MED ORDER — OXYCODONE-ACETAMINOPHEN 5-325 MG PO TABS: 1.0000 | ORAL_TABLET | ORAL | Status: DC | PRN

## 2017-05-31 SURGICAL SUPPLY — 62 items
ARMBAND PINK RESTRICT EXTREMIT (MISCELLANEOUS) ×6 IMPLANT
BAG DECANTER FOR FLEXI CONT (MISCELLANEOUS) ×4 IMPLANT
BIOPATCH RED 1 DISK 7.0 (GAUZE/BANDAGES/DRESSINGS) ×3 IMPLANT
BIOPATCH RED 1IN DISK 7.0MM (GAUZE/BANDAGES/DRESSINGS) ×1
CANISTER SUCT 3000ML PPV (MISCELLANEOUS) ×4 IMPLANT
CANNULA VESSEL 3MM 2 BLNT TIP (CANNULA) ×4 IMPLANT
CATH PALINDROME RT-P 15FX19CM (CATHETERS) IMPLANT
CATH PALINDROME RT-P 15FX23CM (CATHETERS) ×2 IMPLANT
CATH PALINDROME RT-P 15FX28CM (CATHETERS) IMPLANT
CATH PALINDROME RT-P 15FX55CM (CATHETERS) IMPLANT
CHLORAPREP W/TINT 26ML (MISCELLANEOUS) ×6 IMPLANT
CLIP VESOCCLUDE MED 6/CT (CLIP) ×4 IMPLANT
CLIP VESOCCLUDE SM WIDE 6/CT (CLIP) ×4 IMPLANT
COVER PROBE W GEL 5X96 (DRAPES) IMPLANT
COVER SURGICAL LIGHT HANDLE (MISCELLANEOUS) ×4 IMPLANT
DECANTER SPIKE VIAL GLASS SM (MISCELLANEOUS) ×2 IMPLANT
DERMABOND ADVANCED (GAUZE/BANDAGES/DRESSINGS) ×4
DERMABOND ADVANCED .7 DNX12 (GAUZE/BANDAGES/DRESSINGS) ×2 IMPLANT
DRAPE C-ARM 42X72 X-RAY (DRAPES) ×4 IMPLANT
DRAPE CHEST BREAST 15X10 FENES (DRAPES) ×4 IMPLANT
DRSG COVADERM 4X6 (GAUZE/BANDAGES/DRESSINGS) ×2 IMPLANT
ELECT REM PT RETURN 9FT ADLT (ELECTROSURGICAL) ×4
ELECTRODE REM PT RTRN 9FT ADLT (ELECTROSURGICAL) ×2 IMPLANT
GAUZE SPONGE 4X4 16PLY XRAY LF (GAUZE/BANDAGES/DRESSINGS) ×4 IMPLANT
GLOVE BIO SURGEON STRL SZ7.5 (GLOVE) ×6 IMPLANT
GLOVE BIOGEL PI IND STRL 6.5 (GLOVE) IMPLANT
GLOVE BIOGEL PI IND STRL 7.5 (GLOVE) IMPLANT
GLOVE BIOGEL PI IND STRL 8 (GLOVE) ×2 IMPLANT
GLOVE BIOGEL PI INDICATOR 6.5 (GLOVE) ×8
GLOVE BIOGEL PI INDICATOR 7.5 (GLOVE) ×2
GLOVE BIOGEL PI INDICATOR 8 (GLOVE) ×4
GLOVE ECLIPSE 7.0 STRL STRAW (GLOVE) ×2 IMPLANT
GOWN STRL REUS W/ TWL LRG LVL3 (GOWN DISPOSABLE) ×6 IMPLANT
GOWN STRL REUS W/ TWL XL LVL3 (GOWN DISPOSABLE) IMPLANT
GOWN STRL REUS W/TWL LRG LVL3 (GOWN DISPOSABLE) ×10
GOWN STRL REUS W/TWL XL LVL3 (GOWN DISPOSABLE) ×2
KIT BASIN OR (CUSTOM PROCEDURE TRAY) ×4 IMPLANT
KIT TURNOVER KIT B (KITS) ×4 IMPLANT
NDL 18GX1X1/2 (RX/OR ONLY) (NEEDLE) ×2 IMPLANT
NDL HYPO 25GX1X1/2 BEV (NEEDLE) ×2 IMPLANT
NEEDLE 18GX1X1/2 (RX/OR ONLY) (NEEDLE) ×4 IMPLANT
NEEDLE HYPO 25GX1X1/2 BEV (NEEDLE) ×4 IMPLANT
NS IRRIG 1000ML POUR BTL (IV SOLUTION) ×4 IMPLANT
PACK CV ACCESS (CUSTOM PROCEDURE TRAY) ×4 IMPLANT
PACK SURGICAL SETUP 50X90 (CUSTOM PROCEDURE TRAY) ×4 IMPLANT
PAD ARMBOARD 7.5X6 YLW CONV (MISCELLANEOUS) ×8 IMPLANT
SPONGE SURGIFOAM ABS GEL 100 (HEMOSTASIS) IMPLANT
SUT ETHILON 3 0 PS 1 (SUTURE) ×4 IMPLANT
SUT PROLENE 6 0 BV (SUTURE) ×4 IMPLANT
SUT VIC AB 3-0 SH 27 (SUTURE) ×2
SUT VIC AB 3-0 SH 27X BRD (SUTURE) ×2 IMPLANT
SUT VIC AB 4-0 PS2 27 (SUTURE) ×4 IMPLANT
SUT VICRYL 4-0 PS2 18IN ABS (SUTURE) ×4 IMPLANT
SYR 10ML LL (SYRINGE) ×4 IMPLANT
SYR 20CC LL (SYRINGE) ×8 IMPLANT
SYR 5ML LL (SYRINGE) ×8 IMPLANT
SYR CONTROL 10ML LL (SYRINGE) ×4 IMPLANT
SYRINGE 20CC LL (MISCELLANEOUS) ×2 IMPLANT
TOWEL GREEN STERILE (TOWEL DISPOSABLE) ×8 IMPLANT
TOWEL GREEN STERILE FF (TOWEL DISPOSABLE) ×4 IMPLANT
UNDERPAD 30X30 (UNDERPADS AND DIAPERS) ×4 IMPLANT
WATER STERILE IRR 1000ML POUR (IV SOLUTION) ×4 IMPLANT

## 2017-05-31 NOTE — Interval H&P Note (Signed)
History and Physical Interval Note:  06/10/2017 8:53 AM  [name]  has presented today for surgery, with the diagnosis of ACUTE KIDNEY INJURY FOR HEMODIALYSIS ACCESS  The various methods of treatment have been discussed with the patient and family. After consideration of risks, benefits and other options for treatment, the patient has consented to  Procedure(s): INSERTION OF DIALYSIS CATHETER (N/A) ARTERIOVENOUS (AV) FISTULA CREATION RIGHT (Right) as a surgical intervention .  The patient's history has been reviewed, patient examined, no change in status, stable for surgery.  I have reviewed the patient's chart and labs.  Questions were answered to the patient's satisfaction.     Dylan [name]

## 2017-05-31 NOTE — Telephone Encounter (Signed)
Sched appt 07/14/17; lab at 2:30 and MD at 3:45. Pt's and emergency contacts' ph#s not working. Mailed appt letter.

## 2017-05-31 NOTE — Op Note (Signed)
    NAME: Dylan [name]    MRN: [id number] DOB: January 31, 1966    DATE OF OPERATION: 2017-06-24  PREOP DIAGNOSIS:    End-stage renal disease  POSTOP DIAGNOSIS:    Same  PROCEDURE:    1.  Placement of right IJ tunneled dialysis catheter 2.  Right brachial cephalic AV fistula and ligation of competing branch  SURGEON: Di Kindle. Edilia Bo, MD, FACS  ASSIST: Clinton Gallant, PA  ANESTHESIA: General  EBL: Minimal  INDICATIONS:    [name] is a 52 y.o. male with significant thrombocytopenia.  He has a temporary right IJ dialysis catheter.  I was asked to place a tunneled catheter and an AV fistula.  FINDINGS:   I exchange the catheter over a wire given his thrombocytopenia.  The upper arm cephalic vein was 4 mm.  There was a large competing branch which I ligated.  TECHNIQUE:   The patient was taken to the operating room and received a general anesthetic.  The neck and upper chest and the old catheter were prepped and draped in usual sterile fashion.  I divided the catheter and passed a guidewire into the right atrium.  The old catheter was removed.  I dilated the tract and then the dilator and peel-away sheath were advanced over the wire and the wire and dilator were removed.  The 23 cm tunneled dialysis catheter was passed through the peel-away sheath and positioned in the right atrium.  The exit site for the catheter was selected.  The cath was brought through the tunnel cath to the appropriate length and the distal ports were attached.  Both ports withdrew easily we then flushed with heparinized saline and filled with concentrated heparin.  The catheter was secured at its exit site with a 3-0 nylon suture.  The IJ cannulation site was closed with a 4-0 subcuticular stitch.  Sterile dressing was applied.  Attention was then turned to the right arm.  A transverse incision was made at the antecubital level.  Here the cephalic vein was dissected free and ligated distally.  The  brachial artery was dissected free beneath the fascia.  I did not heparinize the patient because of the history of thrombocytopenia and possibly HIT.  The brachial artery was clamped proximally and distally and a longitudinal arteriotomy was made.  The vein was sewn end-to-side using continuous 6-0 Prolene suture.  At the completion was a good thrill in the fistula.  There was a large competing branch which I identified with SonoSite.  This was ligated with a 2-0 silk tie using one small incision over this branch.  Hemostasis was obtained in the wounds.  The wounds were each closed with a deep layer of 3-0 Vicryl the skin closed with 4-0 Vicryl.  Dermabond was applied.  The patient tolerated the procedure well was transferred to the recovery room in stable condition.  All needle and sponge counts were correct.  Waverly Ferrari, MD, FACS [facility]  DATE OF DICTATION:   06/24/17

## 2017-05-31 NOTE — Progress Notes (Signed)
OT Cancellation    05/30/2017 1100  OT Visit Information  Last OT Received On 05/29/2017  Reason Eval/Treat Not Completed Patient at procedure or test/ unavailable  [facility], OT/L  [phone] 05/25/2017

## 2017-05-31 NOTE — Progress Notes (Signed)
Patient refused CPAP at this time. Requests RT to come back at 21:00. I let patient know it is now 21:115. Patient requests RT to come back at 22:00. This RT will pass along to covering RT.

## 2017-05-31 NOTE — Anesthesia Preprocedure Evaluation (Addendum)
Anesthesia Evaluation  Patient identified by MRN, date of birth, ID band Patient awake    Reviewed: Allergy & Precautions, NPO status , Patient's Chart, lab work & pertinent test results  Airway Mallampati: II  TM Distance: >3 FB Neck ROM: Full    Dental   Pulmonary neg pulmonary ROS,    breath sounds clear to auscultation       Cardiovascular hypertension, Pt. on medications + CAD and + Peripheral Vascular Disease   Rhythm:Regular Rate:Normal     Neuro/Psych Depression negative neurological ROS     GI/Hepatic Neg liver ROS, GERD  ,  Endo/Other  diabetes, Type 2  Renal/GU Renal disease     Musculoskeletal   Abdominal   Peds  Hematology  (+) Blood dyscrasia, anemia ,   Anesthesia Other Findings   Reproductive/Obstetrics                             Lab Results  Component Value Date   WBC 6.9 05/07/2017   HGB 7.9 (L) 05/29/2017   HCT 24.2 (L) 05/23/2017   MCV 87.1 05/19/2017   PLT 43 (L) 05/22/2017   Lab Results  Component Value Date   CREATININE 3.54 (H) 05/16/2017   BUN 44 (H) 05/16/2017   NA 138 05/10/2017   K 4.3 05/04/2017   CL 101 05/27/2017   CO2 27 05/25/2017    Anesthesia Physical Anesthesia Plan  ASA: IV  Anesthesia Plan: General   Post-op Pain Management:    Induction: Intravenous  PONV Risk Score and Plan: 2 and Ondansetron, Dexamethasone and Treatment may vary due to age or medical condition  Airway Management Planned: LMA  Additional Equipment:   Intra-op Plan:   Post-operative Plan: Extubation in OR  Informed Consent: I have reviewed the patients History and Physical, chart, labs and discussed the procedure including the risks, benefits and alternatives for the proposed anesthesia with the patient or authorized representative who has indicated his/her understanding and acceptance.   Dental advisory given  Plan Discussed with:   Anesthesia Plan  Comments:        Anesthesia Quick Evaluation

## 2017-05-31 NOTE — Progress Notes (Signed)
Noted consult for DBIV from [facility]. Pt had a recent replacement of HDC. Current HDC is DL and pt does not have any other central line. Discussed with primary nurse that phlebotomy will need to obtain lab work.

## 2017-05-31 NOTE — Telephone Encounter (Signed)
-----   Message from Sharee Pimple, RN sent at 05/08/2017 12:03 PM EDT ----- Regarding: 6 weeks w/ duplex   ----- Message ----- From: Chuck Hint, MD Sent: 05/21/2017  11:21 AM To: Vvs Charge Pool Subject: charge and f/u                                  PROCEDURE:   1.  Placement of right IJ tunneled dialysis catheter 2.  Right brachial cephalic AV fistula and ligation of competing branch  SURGEON: Di Kindle. Edilia Bo, MD, FACS  ASSIST: Clinton Gallant, PA  He will need a follow-up visit in 6 weeks with a duplex at that time to check on the maturation of the fistula.  Thank you.

## 2017-05-31 NOTE — Progress Notes (Signed)
Subjective: Interval History: has no complaint, weak, has not been up in chair.  Objective: Vital signs in last 24 hours: Temp:  [97 F (36.1 C)-98.1 F (36.7 C)] 97.2 F (36.2 C) (04/29 1240) Pulse Rate:  [71-87] 73 (04/29 1240) Resp:  [13-20] 16 (04/29 1240) BP: (90-144)/(63-85) 120/77 (04/29 1240) SpO2:  [89 %-100 %] 99 % (04/29 1240) Weight:  [119.7 kg (264 lb)] 119.7 kg (264 lb) (04/29 0552) Weight change: -0.907 kg (-2 lb)  Intake/Output from previous day: 04/28 0701 - 04/29 0700 In: 100 [IV Piggyback:100] Out: -  Intake/Output this shift: Total I/O In: 525 [I.V.:525] Out: 225 [Urine:200; Blood:25]  General appearance: alert, cooperative, no distress and moderately obese Resp: rhonchi bilaterally Chest wall: RIJ PC Cardio: S1, S2 normal and systolic murmur: holosystolic 2/6, blowing at apex GI: soft, non-tender; bowel sounds normal; no masses,  no organomegaly Extremities: avf RUA,  braces, boot on ankles/feet  Lab Results: Recent Labs    05/30/17 0811 05/16/2017 0237  WBC 7.9 6.9  HGB 8.1* 7.9*  HCT 24.1* 24.2*  PLT 58* 43*   BMET:  Recent Labs    05/30/17 0811 05/13/2017 0237  NA 136 138  K 4.4 4.3  CL 100* 101  CO2 28 27  GLUCOSE 84 113*  BUN 38* 44*  CREATININE 3.13* 3.54*  CALCIUM 6.8* 6.7*   No results for input(s): PTH in the last 72 hours. Iron Studies: No results for input(s): IRON, TIBC, TRANSFERRIN, FERRITIN in the last 72 hours.  Studies/Results: Dg Chest Port 1 View  Result Date: 05/07/2017 CLINICAL DATA:  Right dialysis catheter placement. EXAM: PORTABLE CHEST 1 VIEW COMPARISON:  05/15/2017 FINDINGS: Right dialysis catheter tip in the upper right atrium. No pneumothorax. Heart is upper limits normal in size. Mild perihilar and lower lobe airspace opacities, right greater than left, improved on the left since prior study, likely not significantly changed on the right. Favor asymmetric edema. No effusions. No acute bony abnormality.  IMPRESSION: Right dialysis catheter placement.  No pneumothorax. Bilateral perihilar and lower lobe opacities, improved on the left since prior study, likely improving asymmetric edema. Electronically Signed   By: Charlett Nose M.D.   On: 05/29/2017 12:00   Dg Fluoro Guide Cv Line-no Report  Result Date: 05/28/2017 Fluoroscopy was utilized by the requesting physician.  No radiographic interpretation.    I have reviewed the patient's current medications.  Assessment/Plan: 1 AKI oliguric??, will watch vol and chem.  Baseline Cr 1.6. 2 Urosepsis 3 Quad needs to be up in chair for chronic therapy HD 4 Anemia will follow FE/esa 5 HPTH needs value 6 Severe debill P follow chem, vol, give esa, check PTH   LOS: 30 days   Dylan [name] 05/25/2017,2:51 PM

## 2017-05-31 NOTE — Progress Notes (Signed)
PROGRESS NOTE    Dylan [name]  ZOX:[id number] DOB: [birth year] DOA: 04/06/2017 PCP: Patient, No Pcp Per   Brief Narrative: Patient is a 52 year old male with past medical history significant for recent quadriplegia, multiple sclerosis, decompression of the spinal abscess, spinal stenosis who has been  admitted for the management of acute kidney injury.  He was recently admitted to neurosurgery on 04/12/2017 and had surgery(corpectomy/decompression surgery) on 03/25/17 for cord compression due to spinal stenosis at C6 ,C5 and C7.  Postoperative course was completed with atrial fibrillation, started on Eliquis but discontinued due to spinal hematoma. Also epidural clot versus abscess was suspected on his follow up  C-spine MRI. Because of the high risk patient decided not to repeat exploratory surgery.  He was also started on IV antibiotics due to complicated UTI ,possible cervical abscess and was discharged to skilled nursing facility on 04/09/2017.   His Foley catheter was not draining at the skilled nursing facility so he was sent to the emergency department.  Found to have acute kidney injury. Urology was also consulted, flushed his Foley with removal of foreign body yeilding 2.5 L of urine. Currently patient has been managed for acute kidney injury thought to be secondary to possible vancomycin related.  Nephrology following.  Patient hospital course is also complicated with sepsis, Candida glabrata fungal anemia, progressive oliguric renal failure, hyperkalemia , thrombocytopenia. Patient underwent right-sided tunneled catheter placement for hemodialysis and right brachiocephalic AV fistula today.  Assessment & Plan:   Principal Problem:   MDD (major depressive disorder), single episode, moderate (HCC) Active Problems:   Uncontrolled diabetes mellitus type 2 without complications (HCC)   Hypertension   Morbid obesity (HCC)   Paroxysmal atrial fibrillation (HCC)   Multiple sclerosis  exacerbation (HCC)   Pressure injury of skin   Hyperkalemia   AKI (acute kidney injury) (HCC)   Hematoma   Insomnia   Adjustment reaction with anxiety and depression   Quadriplegia (HCC)   History of fusion of cervical spine   Postoperative wound infection   Coronary artery disease   Atherosclerotic peripheral vascular disease (HCC)   Candidemia (HCC)   Goals of care, counseling/discussion   Palliative care by specialist   Acute lower UTI  Acute renal failure: Thought to be secondary to obstructive uropathy versus vancomycin-induced renal injury.  Nephrology is following.  He has been started on dialysis since 05/07/2017. Permanent  dialysis catheter placed today.  There was also concern that patient might need stretcher dialysis which would be possible only out of state.But recently, he has been  able to sit. Creatinine today is 3.13.   S/P right brachiocephalic AV fistula .  Severe thrombocytopenia: Thought to be secondary to drug related.  Anidulafungin, daptomycin and cefepime stopped.  Protonix also stopped.  HIT negative.  Patient does not receive heparin with dialysis.  Hematology was also consulted .He has received several units of platelets transfusion. Hematology feels that this is not likely immune medicine thrombocytopenia given patient response to transfusion.  We will consult hematology again if his platelet level fall down. Platelets level 43000 today.  Continue to monitor CBC.  GI bleed/acute blood loss anemia/acute on chronic anemia: Protonix stopped due to thrombocytopenia.  Status post 2 units of PRBC.  No further bleeding noted for last few days. GI on board.  Recommending continue close monitoring.  Recommended to reconsult if rebleeding.  No new episodes of hematochezia . hemoglobin  Today is 7.9 .We will reconsult GI if  new episode of GI  bleed occurs.Monitor CBC  Elevated troponin: Mild.  Did not trend up.  Patient denies any chest pain.  Likely supply demand  ischemia  Quadriplegia/cervical abscess versus cervical hematoma/infected cervical hematoma: Patient completed 6 weeks of antibiotics including 4 weeks of vancomycin and cefepime followed by 2 weeks of daptomycin and cefepime.  Course completed on 05/16/2017. Neurosurgery was following before.  ID was following  Neurogenic bladder: Patient was discharged with Foley catheter on 04/09/2017 in the context of new quadriplegia. Foley catheter exhanged on admission. Foley had to be reinserted  because of retention.    UTI: Urine culture growing gram-negative rods.  Will follow final urine culture report .Continue ceftriaxone.  Candidemia: Patient had 10 days of  Anidulafungin which has been discontinued due to thrombocytopenia. Ophthalmology also consulted, no evidence of intraocular fungal infection.  Echocardiogram did not show any vegetation.  Diabetes type 2: Continue Lantus, sliding scale insulin.  We will continue monitor blood sugars.  Paroxysmal A. fib: No anticoagulation given his current thrombocytopenia.  Continue amiodarone, metoprolol and Cardizem.  Multiple sclerosis: Recently diagnosed and he was given 5 days of high-dose steroids with improvement.  Neurology consulted.  Recommended to follow as an outpatient.  Patient has braces on bilateral  Forearms to prevent spasticity.  Adjustment disorder/depression: Continue mirtazapine.  Psychiatry was following.  Recommended trazodone.  Hyponatremia/hypokalemia: Currently stable  Unstageable pressure injury on bilateral buttocks: Continue supportive care.  Continue wound care  Nocturnal hypoxia: No history of OSA or the use of CPAP.  Continue supplemental oxygen as needed/CPAP at night.  Goals of care: Palliative care was consulted.  Remains full code    DVT prophylaxis: SCD Code Status: Full Family Communication: Discussed with [name] on phone few days ago Disposition Plan: Skilled nursing facility after improvement in  thrombocytopenia and after starting  Dialysis,AVF placement   Consultants: Nephrology, neurology, psychiatry, infectious disease, ophthalmology, GI  Procedures: Echocardiogram, upper external vein mapping  Antimicrobials: None at the moment  Subjective: Patient seen and examined the bedside this afternoon.  Underwent tunneled catheter placement and AV fistula today.  Remains comfortable.  Objective: Vitals:   05/08/2017 1201 05/17/2017 1216 05/06/2017 1231 05/18/2017 1240  BP: 110/77 112/77 111/78 120/77  Pulse: 75 72 74 73  Resp: 13 15 17 16   Temp:    (!) 97.2 F (36.2 C)  TempSrc:      SpO2: 97% 98% 100% 99%  Weight:      Height:        Intake/Output Summary (Last 24 hours) at 05/22/2017 1523 Last data filed at 05/25/2017 1242 Gross per 24 hour  Intake 625 ml  Output 225 ml  Net 400 ml   Filed Weights   05/29/17 1306 05/30/17 0601 05/17/2017 0552  Weight: 118.9 kg (262 lb 2 oz) 119.7 kg (264 lb) 119.7 kg (264 lb)    Examination:  General exam: Appears calm and comfortable ,Not in distress,obese HEENT:PERRL,Oral mucosa moist, Ear/Nose normal on gross exam Respiratory system: Bilateral equal air entry, normal vesicular breath sounds, no wheezes or crackles  Cardiovascular system: S1 & S2 heard, RRR. No JVD, murmurs, rubs, gallops or clicks, right-sided, right brachiocephalic AV fistula Gastrointestinal system: Abdomen is nondistended, soft and nontender. No organomegaly or masses felt. Normal bowel sounds heard. Central nervous system: Alert and oriented.  Quadriplegia Extremities: Trace edema, no clubbing ,no cyanosis, distal peripheral pulses palpable.  Spasticity. Skin: Unstageable ulcers on the buttock    Data Reviewed: I have personally reviewed following labs and imaging studies  CBC: Recent Labs  Lab 05/26/17 0826 05/27/17 0520 05/28/17 0422 05/30/17 0811 Jun 22, 2017 0237  WBC 7.1 7.9 7.2 7.9 6.9  NEUTROABS  --  6.6  --  6.5 5.5  HGB 8.2* 8.5* 7.7* 8.1* 7.9*    HCT 24.1* 25.2* 22.9* 24.1* 24.2*  MCV 85.8 86.3 86.4 88.0 87.1  PLT 25* 33* 75* 58* 43*   Basic Metabolic Panel: Recent Labs  Lab 05/26/17 0826 05/27/17 0520 05/28/17 0422 05/29/17 0316 05/30/17 0811 06/22/2017 0237  NA 135 136 137  136 138 136 138  K 4.3 4.5 4.7  4.7 4.8 4.4 4.3  CL 98* 100* 95*  100* 99* 100* 101  CO2 28 26 25  25 27 28 27   GLUCOSE 148* 146* 173*  176* 216* 84 113*  BUN 36* 47* 54*  56* 65* 38* 44*  CREATININE 3.31* 3.95* 4.42*  4.46* 4.72* 3.13* 3.54*  CALCIUM 6.9* 7.0* 7.1*  6.9* 6.8* 6.8* 6.7*  MG  --  1.7  --   --   --   --   PHOS 3.8 3.8 3.9 3.8 3.4  --    GFR: Estimated Creatinine Clearance: 35.3 mL/min (A) (by C-G formula based on SCr of 3.54 mg/dL (H)). Liver Function Tests: Recent Labs  Lab 05/26/17 0826 05/27/17 0520 05/28/17 0422 05/29/17 0316 05/30/17 0811  AST  --  25  --   --   --   ALT  --  53  --   --   --   ALKPHOS  --  130*  --   --   --   BILITOT  --  0.8  --   --   --   PROT  --  4.6*  --   --   --   ALBUMIN 2.0* 2.1* 2.1* 2.0* 2.0*   No results for input(s): LIPASE, AMYLASE in the last 168 hours. No results for input(s): AMMONIA in the last 168 hours. Coagulation Profile: Recent Labs  Lab 05/28/17 0422 06/22/17 0237  INR 1.22 1.31   Cardiac Enzymes: No results for input(s): CKTOTAL, CKMB, CKMBINDEX, TROPONINI in the last 168 hours. BNP (last 3 results) No results for input(s): PROBNP in the last 8760 hours. HbA1C: No results for input(s): HGBA1C in the last 72 hours. CBG: Recent Labs  Lab 05/30/17 1623 05/30/17 2138 06/22/17 0619 06/22/17 0625 2017/06/22 1146  GLUCAP 103* 107* 112* 106* 118*   Lipid Profile: No results for input(s): CHOL, HDL, LDLCALC, TRIG, CHOLHDL, LDLDIRECT in the last 72 hours. Thyroid Function Tests: No results for input(s): TSH, T4TOTAL, FREET4, T3FREE, THYROIDAB in the last 72 hours. Anemia Panel: No results for input(s): VITAMINB12, FOLATE, FERRITIN, TIBC, IRON, RETICCTPCT in  the last 72 hours. Sepsis Labs: No results for input(s): PROCALCITON, LATICACIDVEN in the last 168 hours.  Recent Results (from the past 240 hour(s))  Culture, Urine     Status: Abnormal (Preliminary result)   Collection Time: 05/29/17  3:38 AM  Result Value Ref Range Status   Specimen Description URINE, CATHETERIZED  Final   Special Requests   Final    NONE Performed at [facility], [address] [place], [place] [address]    Culture (A)  Final    >=100,000 COLONIES/mL ESCHERICHIA COLI 80,000 COLONIES/mL STAPHYLOCOCCUS AUREUS    Report Status PENDING  Incomplete         Radiology Studies: Dg Chest Port 1 View  Result Date: 22-Jun-2017 CLINICAL DATA:  Right dialysis catheter placement. EXAM: PORTABLE CHEST 1 VIEW  COMPARISON:  05/15/2017 FINDINGS: Right dialysis catheter tip in the upper right atrium. No pneumothorax. Heart is upper limits normal in size. Mild perihilar and lower lobe airspace opacities, right greater than left, improved on the left since prior study, likely not significantly changed on the right. Favor asymmetric edema. No effusions. No acute bony abnormality. IMPRESSION: Right dialysis catheter placement.  No pneumothorax. Bilateral perihilar and lower lobe opacities, improved on the left since prior study, likely improving asymmetric edema. Electronically Signed   By: Charlett Nose M.D.   On: 05/16/2017 12:00   Dg Fluoro Guide Cv Line-no Report  Result Date: 05/26/2017 Fluoroscopy was utilized by the requesting physician.  No radiographic interpretation.        Scheduled Meds: . amiodarone  200 mg Oral Daily  . collagenase   Topical Daily  . [START ON 06/08/2017] darbepoetin (ARANESP) injection - DIALYSIS  200 mcg Intravenous Q Tue-HD  . dexamethasone  3 mg Oral Q8H  . diltiazem  120 mg Oral QHS  . feeding supplement (ENSURE ENLIVE)  237 mL Oral BID BM  . guaiFENesin  600 mg Oral BID  . insulin aspart  0-5 Units Subcutaneous QHS  . insulin aspart   0-9 Units Subcutaneous TID WC  . insulin aspart  2 Units Subcutaneous TID WC  . insulin glargine  12 Units Subcutaneous Daily  . metoprolol tartrate  12.5 mg Oral BID  . mirtazapine  30 mg Oral QHS  . multivitamin  1 tablet Oral QHS   Continuous Infusions: . sodium chloride    . sodium chloride    . sodium chloride    . cefTRIAXone (ROCEPHIN)  IV 1 g (05/03/2017 1509)  . [START ON 06/01/2017] ferric gluconate (FERRLECIT/NULECIT) IV       LOS: 30 days    Time spent: 25 mins.More than 50% of that time was spent in counseling and/or coordination of care.      Burnadette Pop, MD [facility] Pager [phone]  If 7PM-7AM, please contact night-coverage [email or web address] Password [facility] 05/24/2017, 3:23 PM

## 2017-05-31 NOTE — Anesthesia Postprocedure Evaluation (Signed)
Anesthesia Post Note  Patient: Dylan [name]  Procedure(s) Performed: INSERTION OF DIALYSIS CATHETER (Right Neck) ARTERIOVENOUS (AV) FISTULA CREATION RIGHT (Right Arm Upper)     Patient location during evaluation: PACU Anesthesia Type: General Level of consciousness: awake and alert Pain management: pain level controlled Vital Signs Assessment: post-procedure vital signs reviewed and stable Respiratory status: spontaneous breathing, nonlabored ventilation, respiratory function stable and patient connected to nasal cannula oxygen Cardiovascular status: blood pressure returned to baseline and stable Postop Assessment: no apparent nausea or vomiting Anesthetic complications: no    Last Vitals:  Vitals:   06-22-17 1231 06-22-2017 1240  BP: 111/78 120/77  Pulse: 74 73  Resp: 17 16  Temp:  (!) 36.2 C  SpO2: 100% 99%    Last Pain:  Vitals:   06-22-17 1240  TempSrc:   PainSc: 0-No pain                 Kennieth Rad

## 2017-05-31 NOTE — Transfer of Care (Signed)
Immediate Anesthesia Transfer of Care Note  Patient: Dylan [name]  Procedure(s) Performed: INSERTION OF DIALYSIS CATHETER (Right Neck) ARTERIOVENOUS (AV) FISTULA CREATION RIGHT (Right Arm Upper)  Patient Location: PACU  Anesthesia Type:General  Level of Consciousness: drowsy and patient cooperative  Airway & Oxygen Therapy: Patient Spontanous Breathing and Patient connected to face mask oxygen  Post-op Assessment: Report given to RN and Post -op Vital signs reviewed and stable  Post vital signs: Reviewed and stable  Last Vitals:  Vitals Value Taken Time  BP 100/69 2017-06-29 11:48 AM  Temp 36.1 C 06/29/17 11:46 AM  Pulse 80 2017-06-29 11:50 AM  Resp 22 06-29-2017 11:50 AM  SpO2 97 % 06/29/2017 11:50 AM  Vitals shown include unvalidated device data.  Last Pain:  Vitals:   2017-06-29 0856  TempSrc: Oral  PainSc:       Patients Stated Pain Goal: 0 (05/26/17 1250)  Complications: No apparent anesthesia complications

## 2017-05-31 NOTE — Anesthesia Procedure Notes (Signed)
Procedure Name: LMA Insertion Date/Time: 05/07/2017 10:04 AM Performed by: Marny Lowenstein, CRNA Pre-anesthesia Checklist: Patient identified, Emergency Drugs available, Suction available, Patient being monitored and Timeout performed Patient Re-evaluated:Patient Re-evaluated prior to induction Oxygen Delivery Method: Circle system utilized Preoxygenation: Pre-oxygenation with 100% oxygen Induction Type: IV induction Ventilation: Mask ventilation without difficulty LMA: LMA with gastric port inserted LMA Size: 5.0 Number of attempts: 1 Airway Equipment and Method: Patient positioned with wedge pillow Placement Confirmation: positive ETCO2,  CO2 detector and breath sounds checked- equal and bilateral Tube secured with: Tape Dental Injury: Teeth and Oropharynx as per pre-operative assessment

## 2017-06-01 ENCOUNTER — Encounter (HOSPITAL_COMMUNITY): Payer: Self-pay | Admitting: Vascular Surgery

## 2017-06-01 ENCOUNTER — Other Ambulatory Visit: Payer: Self-pay

## 2017-06-01 DIAGNOSIS — Z992 Dependence on renal dialysis: Principal | ICD-10-CM

## 2017-06-01 DIAGNOSIS — Z48812 Encounter for surgical aftercare following surgery on the circulatory system: Secondary | ICD-10-CM

## 2017-06-01 DIAGNOSIS — N186 End stage renal disease: Secondary | ICD-10-CM

## 2017-06-01 LAB — RENAL FUNCTION PANEL
Albumin: 1.8 g/dL — ABNORMAL LOW (ref 3.5–5.0)
Anion gap: 14 (ref 5–15)
BUN: 50 mg/dL — AB (ref 6–20)
CALCIUM: 6.7 mg/dL — AB (ref 8.9–10.3)
CO2: 21 mmol/L — AB (ref 22–32)
Chloride: 102 mmol/L (ref 101–111)
Creatinine, Ser: 4.09 mg/dL — ABNORMAL HIGH (ref 0.61–1.24)
GFR, EST AFRICAN AMERICAN: 18 mL/min — AB (ref >60)
GFR, EST NON AFRICAN AMERICAN: 16 mL/min — AB (ref >60)
Glucose, Bld: 207 mg/dL — ABNORMAL HIGH (ref 65–99)
Phosphorus: 5.1 mg/dL — ABNORMAL HIGH (ref 2.5–4.6)
Potassium: 4.9 mmol/L (ref 3.5–5.1)
SODIUM: 137 mmol/L (ref 135–145)

## 2017-06-01 LAB — GLUCOSE, CAPILLARY
GLUCOSE-CAPILLARY: 214 mg/dL — AB (ref 65–99)
GLUCOSE-CAPILLARY: 205 mg/dL — AB (ref 65–99)
GLUCOSE-CAPILLARY: 126 mg/dL — AB (ref 65–99)
GLUCOSE-CAPILLARY: 144 mg/dL — AB (ref 65–99)
Glucose-Capillary: 167 mg/dL — ABNORMAL HIGH (ref 65–99)

## 2017-06-01 LAB — CBC WITH DIFFERENTIAL/PLATELET
BASOS ABS: 0 10*3/uL (ref 0.0–0.1)
Basophils Relative: 0 %
EOS ABS: 0 10*3/uL (ref 0.0–0.7)
EOS PCT: 0 %
HCT: 21.9 % — ABNORMAL LOW (ref 39.0–52.0)
Hemoglobin: 7.4 g/dL — ABNORMAL LOW (ref 13.0–17.0)
Lymphocytes Relative: 7 %
Lymphs Abs: 0.7 10*3/uL (ref 0.7–4.0)
MCH: 29.7 pg (ref 26.0–34.0)
MCHC: 33.8 g/dL (ref 30.0–36.0)
MCV: 88 fL (ref 78.0–100.0)
MONO ABS: 0.3 10*3/uL (ref 0.1–1.0)
Monocytes Relative: 3 %
Neutro Abs: 8.8 10*3/uL — ABNORMAL HIGH (ref 1.7–7.7)
Neutrophils Relative %: 90 %
PLATELETS: 49 10*3/uL — AB (ref 150–400)
RBC: 2.49 MIL/uL — AB (ref 4.22–5.81)
RDW: 16.6 % — AB (ref 11.5–15.5)
WBC: 9.8 10*3/uL (ref 4.0–10.5)

## 2017-06-01 LAB — BPAM PLATELET PHERESIS
BLOOD PRODUCT EXPIRATION DATE: 201904292359
Blood Product Expiration Date: 201904302359
ISSUE DATE / TIME: 201904290827
ISSUE DATE / TIME: 201904291402
Unit Type and Rh: 6200
Unit Type and Rh: 7300

## 2017-06-01 LAB — PREPARE PLATELET PHERESIS
UNIT DIVISION: 0
UNIT DIVISION: 0

## 2017-06-01 LAB — PREPARE RBC (CROSSMATCH)

## 2017-06-01 MED ORDER — SODIUM CHLORIDE 0.9 % IV SOLN
Freq: Once | INTRAVENOUS | Status: AC
  Administered 2017-06-01: 17:00:00 via INTRAVENOUS

## 2017-06-01 MED ORDER — SODIUM CHLORIDE 0.9 % IV SOLN
125.0000 mg | INTRAVENOUS | Status: DC
  Filled 2017-06-01: qty 10

## 2017-06-01 NOTE — Progress Notes (Signed)
Family called with update and wanting to know if pt will be having dialysis today. Nephrologist called and will let nursing staff know. Will update family when MD verified.   Sim Boast, RN

## 2017-06-01 NOTE — Progress Notes (Signed)
Received phone call from [facility] in [place] yesterday requesting more information for Dylan [name] and a phone number that they could speak with him. CM provided them the room phone number and informed Dylan [name] they would be calling. CM today faxed them the information they requested: [phone]. CM following.

## 2017-06-01 NOTE — Progress Notes (Signed)
Physical Therapy Treatment Patient Details Name: Dylan [name] MRN: [id number] DOB: Mar 31, 1965 Today's Date: 06/01/2017    History of Present Illness Corran "[name]" Dec is a 52yo black male who comes to [facility] on 3/30 with ABD pain and hypoglycemia, admitted with cervical abcess v hematoma s/p C five-seven surgery on . 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 He DC to SNF ~3WA with motor weakness C7 down, full sensation intact. He reports graual worsening of manipulative use with smartphone over those three weaks.  underwent right-sided tunneled catheter placement for hemodialysis and right brachiocephalic AV fistula on 06/01/17    PT Comments    This session focused on bilat UE/LE therex/ROM and bed mobility. Patient tolerated session well and eager to participate although reported pain in R UE. Continue to progress as tolerated.    Follow Up Recommendations  SNF;Supervision/Assistance - 24 hour     Equipment Recommendations  Other (comment)(TBD)    Recommendations for Other Services OT consult     Precautions / Restrictions Precautions Precautions: Fall;Cervical;Other (comment) Precaution Comments: watch BP; HD catheter RUE Required Braces or Orthoses: Other Brace/Splint Other Brace/Splint: B resting hand splints for night use only; B WHO to use PRN during the day    Mobility  Bed Mobility Overal bed mobility: Needs Assistance Bed Mobility: Rolling Rolling: Total assist;+2 for physical assistance;Max assist         General bed mobility comments: cues for sequencing; pt able to pull on therapist's arms by hooking at elbows to aid in rolling shoulders R and L  Transfers                    Ambulation/Gait                 Stairs             Wheelchair Mobility    Modified Rankin (Stroke Patients  Only)       Balance     Sitting balance-Leahy Scale: Zero                                      Cognition Arousal/Alertness: Lethargic Behavior During Therapy: Flat affect Overall Cognitive Status: Impaired/Different from baseline Area of Impairment: Awareness;Attention;Orientation                 Orientation Level: Time(monday) Current Attention Level: Sustained       Awareness: Emergent   General Comments: drowsy       Exercises General Exercises - Upper Extremity Shoulder Flexion: AAROM;Both;15 reps;Supine Shoulder Extension: AAROM;Both;15 reps;Supine Shoulder ABduction: Both;10 reps Shoulder ADduction: AAROM;Both;15 reps;Supine Shoulder Horizontal ABduction: Both;10 reps Shoulder Horizontal ADduction: Both;10 reps Elbow Flexion: Both;Strengthening;Supine;5 reps Elbow Extension: AAROM;Both;Supine;5 reps Wrist Flexion: AROM;15 reps;Supine;Both Wrist Extension: Left;PROM;10 reps;Right;Strengthening;15 reps Digit Composite Flexion: PROM;Both;10 reps;Supine Composite Extension: PROM;Both;10 reps;Supine(with wrist flexed) General Exercises - Lower Extremity Ankle Circles/Pumps: PROM;Both;20 reps Heel Slides: PROM;Both;20 reps Hip ABduction/ADduction: PROM;Both;20 reps Other Exercises Other Exercises: shoulder shrugs with manual resistance Other Exercises: hand to mouth pattern RUE x 5(as tolerated due to recent graft placement)    General Comments        Pertinent Vitals/Pain Pain Assessment: Faces Faces Pain Scale: Hurts little more Pain Location: L shoulder at IV site with horizontal adduction and R UE with  ROM Pain Descriptors / Indicators: Grimacing;Guarding Pain Intervention(s): Limited activity within patient's tolerance;Monitored during session;Repositioned    Home Living                      Prior Function            PT Goals (current goals can now be found in the care plan section) Acute Rehab PT Goals Patient  Stated Goal: to feed myself Progress towards PT goals: Progressing toward goals    Frequency    Min 2X/week      PT Plan Current plan remains appropriate    Co-evaluation              AM-PAC PT "6 Clicks" Daily Activity  Outcome Measure  Difficulty turning over in bed (including adjusting bedclothes, sheets and blankets)?: Unable Difficulty moving from lying on back to sitting on the side of the bed? : Unable Difficulty sitting down on and standing up from a chair with arms (e.g., wheelchair, bedside commode, etc,.)?: Unable Help needed moving to and from a bed to chair (including a wheelchair)?: Total Help needed walking in hospital room?: Total Help needed climbing 3-5 steps with a railing? : Total 6 Click Score: 6    End of Session Equipment Utilized During Treatment: Oxygen Activity Tolerance: Patient tolerated treatment well Patient left: in bed;with call bell/phone within reach Nurse Communication: Mobility status;Need for lift equipment PT Visit Diagnosis: Other symptoms and signs involving the nervous system (R29.898);Difficulty in walking, not elsewhere classified (R26.2);Muscle weakness (generalized) (M62.81)     Time: 2449-7530 PT Time Calculation (min) (ACUTE ONLY): 28 min  Charges:  $Therapeutic Exercise: 8-22 mins $Therapeutic Activity: 8-22 mins                    G Codes:       Erline Levine, PTA Pager: [phone]     Carolynne Edouard 06/01/2017, 4:36 PM

## 2017-06-01 NOTE — Progress Notes (Signed)
RN spoke to Dr. Darrick Penna and will hold off dialysis today and will resume tomorrow. Per Dr. Darrick Penna to only transfuse 1 unit of blood today. Family updated.   Sim Boast, RN

## 2017-06-01 NOTE — Progress Notes (Signed)
IP PROGRESS NOTE  Subjective:   Events noted in the last few days.  No active bleeding noted at this time.  He denies any hematochezia, melena hemoptysis or hematemesis.  His last platelet transfusion was on May 27, 2017.  His platelet count responded appropriately.   Objective:  Vital signs in last 24 hours: Temp:  [98.1 F (36.7 C)-99.2 F (37.3 C)] 98.8 F (37.1 C) (04/30 1308) Pulse Rate:  [76-95] 80 (04/30 1308) Resp:  [16-20] 20 (04/30 1308) BP: (147-162)/(73-84) 152/78 (04/30 1308) SpO2:  [92 %-99 %] 92 % (04/30 1308) Weight:  [269 lb (122 kg)] 269 lb (122 kg) (04/30 0500) Weight change: 5 lb (2.268 kg) Last BM Date: 05/30/17  Intake/Output from previous day: 04/29 0701 - 04/30 0700 In: 525 [I.V.:525] Out: 225 [Urine:200; Blood:25] General: Comfortable appearing gentleman without distress. Head: Normal without any trauma or lesions. Mouth: No oropharyngeal bleeding noted. Eyes: Conjunctiva is clear. Resp: clear to auscultation without any wheezes. Cardio: regular rate any murmurs. GI: Soft, nontender without any rebound. Musculoskeletal: No joint deformity or effusion.  Range of motion. Skin: No rashes or lesions.  No petechiae or ecchymosis noted. His dialysis catheter site. Without any bruising or erythema. No bleeding noted.   Lab Results: Recent Labs    05/20/2017 0237 06/01/17 0923  WBC 6.9 9.8  HGB 7.9* 7.4*  HCT 24.2* 21.9*  PLT 43* 49*    BMET Recent Labs    05/03/2017 0237 06/01/17 0923  NA 138 137  K 4.3 4.9  CL 101 102  CO2 27 21*  GLUCOSE 113* 207*  BUN 44* 50*  CREATININE 3.54* 4.09*  CALCIUM 6.7* 6.7*      Studies/Results: Dg Chest Port 1 View  Result Date: 05/22/2017 CLINICAL DATA:  Right dialysis catheter placement. EXAM: PORTABLE CHEST 1 VIEW COMPARISON:  05/15/2017 FINDINGS: Right dialysis catheter tip in the upper right atrium. No pneumothorax. Heart is upper limits normal in size. Mild perihilar and lower lobe airspace  opacities, right greater than left, improved on the left since prior study, likely not significantly changed on the right. Favor asymmetric edema. No effusions. No acute bony abnormality. IMPRESSION: Right dialysis catheter placement.  No pneumothorax. Bilateral perihilar and lower lobe opacities, improved on the left since prior study, likely improving asymmetric edema. Electronically Signed   By: Charlett Nose M.D.   On: 05/08/2017 12:00   Dg Fluoro Guide Cv Line-no Report  Result Date: 05/30/2017 Fluoroscopy was utilized by the requesting physician.  No radiographic interpretation.    Medications: I have reviewed the patient's current medications.  Assessment/Plan:  52 year old with:  1. Thrombocytopenia: The differential diagnosis was reviewed again including medication review.  Drug related thrombocytopenia remains the most likely etiology.  Immune thrombocytopenia is considered less likely.  His platelet transfusion on May 27, 2017 improved his platelet count is 75,000.  His platelet count remained stable in the last 48 hours.  I anticipate platelet recovery in the immediate future if this is a drug related thrombocytopenia.  Could also be related to consumptive process which might take longer.  I recommend no transfusion at this time and continue to monitor his counts.  I would recommend transfusion if bleeding is noted or his platelet counts drops again below 10,000.  2. GI bleeding: Appears to have resolved at this time.  3.  Anemia: Related to GI blood loss.  No evidence of hemolysis or microangiopathy.  I have no objections to transfusion at this time.  25  minutes was spent with the patient face-to-face today.  More than 50% of time was dedicated to patient counseling, education and coordination of his care.     LOS: 31 days   Eli Hose 06/01/2017, 2:03 PM

## 2017-06-01 NOTE — Progress Notes (Signed)
Pt noted to have scant amount of fresh and dried blood coming from his left ear during bathing time this evening. Mid-Level Craige Cotta was notified as an Financial planner via [employer]. No distress noted. VSS. Will continue to monitor.

## 2017-06-01 NOTE — Progress Notes (Signed)
Nutrition Follow-up  DOCUMENTATION CODES:   Obesity unspecified  INTERVENTION:  Continue Ensure Enlive po BID, each supplement provides 350 kcal and 20 grams of protein  Consider calorie count and nutrition support if PO intake continues to be minimal.  Continue Rena-Vit  NUTRITION DIAGNOSIS:   Inadequate oral intake related to poor appetite as evidenced by per patient/family report. -ongoing  GOAL:   Patient will meet greater than or equal to 90% of their needs -unmet currently  MONITOR:   PO intake, Supplement acceptance, Weight trends, I & O's, Labs  REASON FOR ASSESSMENT:   Low Braden    ASSESSMENT:   Pt with PMH of HTN, DM, MS, recent quadriplegia, s/p decompression spinal surgery on C5, C6 and C7 (03/25/17) developing multiple issues post op including new Afib and prevertebral hematoma at surgical sight. Pt presents with abdominal pain found AKI  4/29 R IJ tunneled dialysis catheter placed and R brachial cephalic AV fistula and ligation of competing branch.  Receiving intermittent HD. Next HD tomorrow per Nephrology.  Spoke with Dylan [name] this morning briefly, he was lethargic, only nods when if he ate this morning. Discussed with RN who states he didn't eat much and has been refusing to drink as well. Meal completion documented at 25% for lunch.   Weight continues to fluctuate downwards to new low of 262 pounds. He may need nutrition support if he continues to eat very poorly and refuse to drink supplements. Refused both ensure today per MAR.  RD will monitor PO intake and recommendations as appropriate.   Intake/Output Summary (Last 24 hours) at 06/01/2017 1516 Last data filed at 06/01/2017 1300 Gross per 24 hour  Intake 220 ml  Output -  Net 220 ml  6L fluid negative  Labs reviewed:  Phos 5.1  Medications reviewed and include:  Amiodarone, Decadron, Insulin, Remeron Iron  Diet Order:   Diet Order           Diet renal/carb modified with fluid  restriction Fluid restriction: 1200 mL Fluid; Room service appropriate? Yes; Fluid consistency: Thin  Diet effective now          EDUCATION NEEDS:   Not appropriate for education at this time  Skin:  Skin Assessment: Skin Integrity Issues: Skin Integrity Issues:: Incisions Stage II: buttocks Unstageable: to buttocks Incisions: closed to R arm and R Neck  Last BM:  05/30/2017 (Type 7)  Height:   Ht Readings from Last 1 Encounters:  05/05/17 6\' 4"  (1.93 m)    Weight:   Wt Readings from Last 1 Encounters:  06/01/17 269 lb (122 kg)    Ideal Body Weight:  91.8 kg  BMI:  Body mass index is 32.74 kg/m.  Estimated Nutritional Needs:   Kcal:  2200-2400  Protein:  120-135 grams  Fluid:  >/= 2.2 L/d  Dylan [name]. Dylan Coate, MS, RD LDN Inpatient Clinical Dietitian Pager [phone]

## 2017-06-01 NOTE — Progress Notes (Signed)
Vital Go bed replaced at this time. Blood transfusion started. Tnns rx reviewed. Will continue to monitor.   Sim Boast, RN

## 2017-06-01 NOTE — Progress Notes (Signed)
Subjective: Interval History: has no complaint. He says he has been up but staff say no and wound care says no to up in chair because of 2 Stage 3 Decub. .  Objective: Vital signs in last 24 hours: Temp:  [97 F (36.1 C)-99.2 F (37.3 C)] 99.2 F (37.3 C) (04/30 1020) Pulse Rate:  [72-95] 87 (04/30 1020) Resp:  [13-20] 20 (04/30 1020) BP: (100-162)/(69-84) 162/80 (04/30 1020) SpO2:  [96 %-100 %] 99 % (04/30 1020) Weight:  [122 kg (269 lb)] 122 kg (269 lb) (04/30 0500) Weight change: 2.268 kg (5 lb)  Intake/Output from previous day: 04/29 0701 - 04/30 0700 In: 525 [I.V.:525] Out: 225 [Urine:200; Blood:25] Intake/Output this shift: No intake/output data recorded.  General appearance: cooperative, moderately obese, pale and slowed mentation Resp: diminished breath sounds bilaterally and rhonchi bilaterally Chest wall: RIJ PC Cardio: S1, S2 normal and systolic murmur: systolic ejection 2/6, decrescendo at 2nd left intercostal space GI: obese, pos bs, soft, liver down 4 cm Extremities: foley, in braces/boots, does not move on his own  Lab Results: Recent Labs    05/25/2017 0237 06/01/17 0923  WBC 6.9 9.8  HGB 7.9* 7.4*  HCT 24.2* 21.9*  PLT 43* 49*   BMET:  Recent Labs    05/18/2017 0237 06/01/17 0923  NA 138 137  K 4.3 4.9  CL 101 102  CO2 27 21*  GLUCOSE 113* 207*  BUN 44* 50*  CREATININE 3.54* 4.09*  CALCIUM 6.7* 6.7*   No results for input(s): PTH in the last 72 hours. Iron Studies: No results for input(s): IRON, TIBC, TRANSFERRIN, FERRITIN in the last 72 hours.  Studies/Results: Dg Chest Port 1 View  Result Date: 05/22/2017 CLINICAL DATA:  Right dialysis catheter placement. EXAM: PORTABLE CHEST 1 VIEW COMPARISON:  05/15/2017 FINDINGS: Right dialysis catheter tip in the upper right atrium. No pneumothorax. Heart is upper limits normal in size. Mild perihilar and lower lobe airspace opacities, right greater than left, improved on the left since prior study,  likely not significantly changed on the right. Favor asymmetric edema. No effusions. No acute bony abnormality. IMPRESSION: Right dialysis catheter placement.  No pneumothorax. Bilateral perihilar and lower lobe opacities, improved on the left since prior study, likely improving asymmetric edema. Electronically Signed   By: Charlett Nose M.D.   On: 05/15/2017 12:00   Dg Fluoro Guide Cv Line-no Report  Result Date: 05/23/2017 Fluoroscopy was utilized by the requesting physician.  No radiographic interpretation.    I have reviewed the patient's current medications.  Assessment/Plan: 1 CKD3/AKI awaiting chem.  Probable ESRD but some hope.  Cannot be up in chair with decub.  Cannot go to outpatient setting, ??where dispo 2 Quad   3 Chronic foley/Urosepsis 4 Anemia esa/Fe 5 HPTH vit D 6 Dm controlled 7Decub per wound care P check chem, poss Hd, esa,     LOS: 31 days   Haylie Mccutcheon 06/01/2017,10:49 AM

## 2017-06-01 NOTE — Progress Notes (Signed)
Patient has no appetite for breakfast or lunch. Patient request for a burger for dinner and he ate all his food at dinner time. Patient noted more alert and concersational at this time in comparison to early on this shift.  Sim Boast, RN

## 2017-06-01 NOTE — Progress Notes (Signed)
   VASCULAR SURGERY ASSESSMENT & PLAN:   1 Day Post-Op s/p: Placement of perm cath and right BC AVF  Fistula with good thrill and palpable right radial pulse.  Vascular will be available as needed.  I have arranged f/u  SUBJECTIVE:   Comfortable  PHYSICAL EXAM:   Vitals:   05/11/2017 2017 06/01/17 0012 06/01/17 0500 06/01/17 0511  BP: (!) 147/84 (!) 149/84  (!) 160/79  Pulse: 82 84  95  Resp: 18 16  18   Temp: 98.1 F (36.7 C) 98.2 F (36.8 C)  98.7 F (37.1 C)  TempSrc: Oral Oral  Oral  SpO2: 98% 98%  96%  Weight:   269 lb (122 kg)   Height:       Good thrill in right BC AVF Palpable right radial pulse Incisions look good.   LABS:   Lab Results  Component Value Date   WBC 6.9 05/09/2017   HGB 7.9 (L) 05/08/2017   HCT 24.2 (L) 05/23/2017   MCV 87.1 05/30/2017   PLT 43 (L) 05/24/2017   Lab Results  Component Value Date   CREATININE 3.54 (H) 05/08/2017   Lab Results  Component Value Date   INR 1.31 05/03/2017   CBG (last 3)  Recent Labs    05/11/2017 1146 05/20/2017 2144 06/01/17 0619  GLUCAP 118* 196* 214*    PROBLEM LIST:    Principal Problem:   MDD (major depressive disorder), single episode, moderate (HCC) Active Problems:   Uncontrolled diabetes mellitus type 2 without complications (HCC)   Hypertension   Morbid obesity (HCC)   Paroxysmal atrial fibrillation (HCC)   Multiple sclerosis exacerbation (HCC)   Pressure injury of skin   Hyperkalemia   AKI (acute kidney injury) (HCC)   Hematoma   Insomnia   Adjustment reaction with anxiety and depression   Quadriplegia (HCC)   History of fusion of cervical spine   Postoperative wound infection   Coronary artery disease   Atherosclerotic peripheral vascular disease (HCC)   Candidemia (HCC)   Goals of care, counseling/discussion   Palliative care by specialist   Acute lower UTI   CURRENT MEDS:   . amiodarone  200 mg Oral Daily  . collagenase   Topical Daily  . [START ON 06/08/2017]  darbepoetin (ARANESP) injection - DIALYSIS  200 mcg Intravenous Q Tue-HD  . dexamethasone  3 mg Oral Q8H  . diltiazem  120 mg Oral QHS  . feeding supplement (ENSURE ENLIVE)  237 mL Oral BID BM  . guaiFENesin  600 mg Oral BID  . insulin aspart  0-5 Units Subcutaneous QHS  . insulin aspart  0-9 Units Subcutaneous TID WC  . insulin aspart  2 Units Subcutaneous TID WC  . insulin glargine  12 Units Subcutaneous Daily  . metoprolol tartrate  12.5 mg Oral BID  . mirtazapine  30 mg Oral QHS  . multivitamin  1 tablet Oral QHS    Waverly Ferrari Beeper: [phone] Office: [phone] 06/01/2017

## 2017-06-01 NOTE — Progress Notes (Addendum)
Occupational Therapy Treatment Patient Details Name: Dylan [name] MRN: [id number] DOB: 1965-04-25 Today's Date: 06/01/2017    History of present illness Dylan [name] is a 52yo black male who comes to [facility] on 3/30 with ABD pain and hypoglycemia, admitted with cervical abcess v hematoma s/p C five-seven surgery on . 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 He DC to SNF ~3WA with motor weakness C7 down, full sensation intact. He reports graual worsening of manipulative use with smartphone over those three weaks.  underwent right-sided tunneled catheter placement for hemodialysis and right brachiocephalic AV fistula on 06/01/17   OT comments  Pt lethargic today with decreased ability to sustain attention to task. RUE sore from procedure yesterday. LUE appears weaker today. Focus of session on BUE strengthening and positioning. Discussed with PT and will try to use Vital Lift bed to increase mobility and sitting tolerance. Will continue to follow.  When asked how he was "coping" with everything, pt became tearful and stated "I'm just doing", and then thanked this therapist for everything I was doing for him.   Follow Up Recommendations  SNF;Supervision/Assistance - 24 hour    Equipment Recommendations  Wheelchair (measurements OT);Wheelchair cushion (measurements OT)    Recommendations for Other Services  Palliative Medicine consult /reconsult for goals of care/coping with current disease process    Precautions / Restrictions Precautions Precautions: Fall;Cervical;Other (comment) Precaution Comments: watch BP; HD catheter RUE Required Braces or Orthoses: Other Brace/Splint Other Brace/Splint: B resting hand splints for night use only; B WHO to use PRN during the day       Mobility Bed Mobility               General bed mobility  comments: total A +2  Transfers                      Balance     Sitting balance-Leahy Scale: Zero                                     ADL either performed or assessed with clinical judgement   ADL Overall ADL's : Needs assistance/impaired                                             Vision       Perception     Praxis      Cognition Arousal/Alertness: Lethargic Behavior During Therapy: Flat affect Overall Cognitive Status: Impaired/Different from baseline Area of Impairment: Awareness;Attention;Orientation                 Orientation Level: Time(monday) Current Attention Level: Sustained       Awareness: Emergent   General Comments: Pt with decreased ability to suwstain attention today and follow commands. Most likely related to meds        Exercises Exercises: General Upper Extremity General Exercises - Upper Extremity Shoulder Flexion: AAROM;Both;15 reps;Supine Shoulder Extension: AAROM;Both;15 reps;Supine Shoulder ABduction: AAROM;Both;15 reps;Supine Shoulder ADduction: AAROM;Both;15 reps;Supine Shoulder Horizontal ABduction: Both;15 reps;Supine Shoulder Horizontal ADduction: Both;15 reps;Supine Elbow Flexion: Both;Strengthening;Supine;5 reps Elbow Extension: AAROM;Both;Supine;5 reps Wrist Flexion: AROM;15 reps;Supine;Both Wrist Extension: Left;PROM;10 reps;Right;Strengthening;15 reps Digit Composite Flexion: PROM;Both;10  reps;Supine Composite Extension: PROM;Both;10 reps;Supine(with wrist flexed) Other Exercises Other Exercises: gentle passive extension L elbow - tight flexor tendons; developing elbow flexion contracture.  Other Exercises: hand to mouth pattern RUE x 10(as tolerated due to recent graft placement) L elbow losing ROM in extension due to weakness, inability to extend L elbow and pt preference to position with elbow flexed. Gentle massage to L anticubital area to increase extension. LUE  appears weaker distally. Able to supinate forearm but unable to flex wrist.  - nsg notified  Shoulder Instructions       General Comments  Pt appears more withdrawn today/lethargic. When asked how he was handling everything, pt responded "i'm just doing it" and became tearful. Pt then stated I just smile and go on. May benefit from another alliative  Consult to help with coping of current disease process.     Pertinent Vitals/ Pain       Pain Assessment: Faces Faces Pain Scale: Hurts little more Pain Location: with L elbow extension Pain Descriptors / Indicators: Grimacing;Guarding Pain Intervention(s): Limited activity within patient's tolerance;Premedicated before session  Home Living                                          Prior Functioning/Environment              Frequency  Min 3X/week        Progress Toward Goals  OT Goals(current goals can now be found in the care plan section)  Progress towards OT goals: Progressing toward goals  Acute Rehab OT Goals Patient Stated Goal: to feed myself OT Goal Formulation: With patient Time For Goal Achievement: 06/10/17 Potential to Achieve Goals: Good ADL Goals Pt Will Perform Eating: with mod assist;with adaptive utensils;sitting Pt Will Perform Grooming: with mod assist;sitting;with adaptive equipment Pt/caregiver will Perform Home Exercise Program: Increased ROM;Increased strength;Both right and left upper extremity;With minimal assist Additional ADL Goal #1: Pt will demonstrate ability to self-direct personal care during ADL/mobility completion. Additional ADL Goal #2: Pt will independently direct caregiver on donning/doffing WHO and purpose of splints. Additional ADL Goal #3: Pt will tolerate B resting hand splints to improve functional positioning of hands without complications  Plan Discharge plan remains appropriate    Co-evaluation                 AM-PAC PT "6 Clicks" Daily Activity      Outcome Measure   Help from another person eating meals?: A Lot Help from another person taking care of personal grooming?: A Lot Help from another person toileting, which includes using toliet, bedpan, or urinal?: Total Help from another person bathing (including washing, rinsing, drying)?: A Lot Help from another person to put on and taking off regular upper body clothing?: Total Help from another person to put on and taking off regular lower body clothing?: Total 6 Click Score: 9    End of Session Equipment Utilized During Treatment: Oxygen  OT Visit Diagnosis: Muscle weakness (generalized) (M62.81);Other symptoms and signs involving the nervous system (R29.898)   Activity Tolerance Patient tolerated treatment well   Patient Left in bed;with call bell/phone within reach   Nurse Communication Mobility status;Other (comment)(need for tilt bed and SCDs)        Time: 1345-1415 OT Time Calculation (min): 30 min  Charges: OT General Charges $OT Visit: 1 Visit OT Treatments $Therapeutic Activity: 23-37 mins  [facility], OT/L  [phone] 06/01/2017   Shariya Gaster,HILLARY 06/01/2017, 2:46 PM

## 2017-06-01 NOTE — Progress Notes (Addendum)
PROGRESS NOTE    Dylan [name]  ZOX:[id number] DOB: [birth year] DOA: 05-22-2017 PCP: Patient, No Pcp Per   Brief Narrative: Patient is a 52 year old male with past medical history significant for recent quadriplegia, multiple sclerosis, decompression of the spinal abscess, spinal stenosis who has been  admitted for the management of acute kidney injury.  He was recently admitted to neurosurgery on 04/12/2017 and had surgery(corpectomy/decompression surgery) on 03/25/17 for cord compression due to spinal stenosis at C6 ,C5 and C7.  Postoperative course was completed with atrial fibrillation, started on Eliquis but discontinued due to spinal hematoma. Also epidural clot versus abscess was suspected on his follow up  C-spine MRI. Because of the high risk patient decided not to repeat exploratory surgery.  He was also started on IV antibiotics due to complicated UTI ,possible cervical abscess and was discharged to skilled nursing facility on 04/09/2017.   His Foley catheter was not draining at the skilled nursing facility so he was sent to the emergency department.  Found to have acute kidney injury. Urology was also consulted, flushed his Foley with removal of foreign body yeilding 2.5 L of urine. Currently patient has been managed for acute kidney injury thought to be secondary to possible vancomycin related.  Nephrology following.  Patient hospital course is also complicated with sepsis, Candida glabrata fungal anemia, progressive oliguric renal failure, hyperkalemia , thrombocytopenia. Patient underwent right-sided tunneled catheter placement for hemodialysis and right brachiocephalic AV fistula on 06/01/17. His disposition will be challenging .Currently he may not be able to sit for dialysis due to ulcers on his buttocks and quadriplegia.  He might need SNF with dialysis facility out of state.  Social worker following.  Assessment & Plan:   Principal Problem:   AKI (acute kidney injury) (HCC) Active  Problems:   Uncontrolled diabetes mellitus type 2 without complications (HCC)   Hypertension   Morbid obesity (HCC)   Paroxysmal atrial fibrillation (HCC)   Multiple sclerosis exacerbation (HCC)   Pressure injury of skin   Hyperkalemia   Hematoma   Insomnia   Adjustment reaction with anxiety and depression   Quadriplegia (HCC)   History of fusion of cervical spine   Postoperative wound infection   Coronary artery disease   Atherosclerotic peripheral vascular disease (HCC)   MDD (major depressive disorder), single episode, moderate (HCC)   Candidemia (HCC)   Goals of care, counseling/discussion   Palliative care by specialist   Acute lower UTI  Acute renal failure: Thought to be secondary to obstructive uropathy versus vancomycin-induced renal injury.  Nephrology is following.  He has been started on dialysis since 05/07/2017. Permanent  dialysis catheter placed on 05/24/2017.  There is also concern that patient might need stretcher dialysis which would be possible only out of state. S/P right brachiocephalic AV fistula .  Severe thrombocytopenia: Thought to be secondary to drug related.  Anidulafungin, daptomycin and cefepime stopped.  Protonix also stopped.  HIT negative.  Patient does not receive heparin with dialysis.  Hematology was also consulted .He has received several units of platelets transfusion. Hematology feels that this is not likely immune medicine thrombocytopenia given patient response to transfusion.  I have requested Dr.  Clelia Croft to reevaluate him today Platelets level 49000 today.  Continue to monitor CBC.  GI bleed/acute blood loss anemia/acute on chronic anemia: Protonix stopped due to thrombocytopenia.  Status post 2 units of PRBC few days ago.  No further bleeding noted for last few days. GI was on board.  Recommending continue close  monitoring.  Recommended to reconsult if rebleeding.  No new episodes of hematochezia . hemoglobin  Today is 7.4 .Will transfuse him  with 2 units of PRBC today .we will reconsult GI if  new episode of GI bleed occurs.Monitor CBC.  GI reluctant to do any kind of procedures unless his platelets are more than 50,000.  Elevated troponin: Mild.  Did not trend up.  Patient denies any chest pain.  Likely supply demand ischemia  Quadriplegia/cervical abscess versus cervical hematoma/infected cervical hematoma: Patient completed 6 weeks of antibiotics including 4 weeks of vancomycin and cefepime followed by 2 weeks of daptomycin and cefepime.  Course completed on 05/16/2017. Neurosurgery was following before.  ID was following,signed off.  Neurogenic bladder: Patient was discharged with Foley catheter on 04/09/2017 in the context of new quadriplegia. Foley catheter exhanged on admission. Foley was taken out during this admission but had to be put back again due to urinary retention.  Continue Foley catheter .  UTI: Urine culture growing Ecoli.  Will follow final urine culture report .Continue ceftriaxone.  Candidemia: Patient had 10 days of  Anidulafungin which has been discontinued due to thrombocytopenia. Ophthalmology also consulted, no evidence of intraocular fungal infection.  Echocardiogram did not show any vegetation.  Diabetes type 2: Continue Lantus, sliding scale insulin.  We will continue monitor blood sugars.  Paroxysmal A. fib: No anticoagulation given his current thrombocytopenia.  Continue amiodarone, metoprolol and Cardizem.  Multiple sclerosis: Recently diagnosed on previous admission and he was given 5 days of high-dose steroids with improvement.  Neurology was consulted at that time.  Recommended to follow as an outpatient.   Adjustment disorder/depression: Continue mirtazapine.  Psychiatry was following.  Recommended trazodone.  Hyponatremia/hypokalemia: Currently stable  Unstageable pressure injury on bilateral buttocks: Continue supportive care.  Continue wound care.  His pressure injury on bilateral buttocks is  making him unable to sit for dialysis.  Nocturnal hypoxia: No history of OSA or the use of CPAP.  Continue supplemental oxygen as needed/CPAP at night.  Goals of care: Palliative care was consulted before.  Remains full code    DVT prophylaxis: SCD Code Status: Full Family Communication: Discussed with family yesterday Disposition Plan: Skilled nursing facility after improvement in thrombocytopenia , hematology evaluation, nephrology clearance and finding of skilled nursing facility with stretcher dialysis facility   Consultants: Nephrology, neurology, psychiatry, infectious disease, ophthalmology, GI  Procedures: Echocardiogram, upper external vein mapping  Antimicrobials: Ceftriaxone day 4  Subjective: Patient seen and examined at bedside this morning.  Remains comfortable.  No new issues/events.  Nephrology might plan for dialysis today.  Objective: Vitals:   06/01/17 0012 06/01/17 0500 06/01/17 0511 06/01/17 1020  BP: (!) 149/84  (!) 160/79 (!) 162/80  Pulse: 84  95 87  Resp: 16  18 20   Temp: 98.2 F (36.8 C)  98.7 F (37.1 C) 99.2 F (37.3 C)  TempSrc: Oral  Oral Oral  SpO2: 98%  96% 99%  Weight:  122 kg (269 lb)    Height:        Intake/Output Summary (Last 24 hours) at 06/01/2017 1237 Last data filed at 05/30/2017 1242 Gross per 24 hour  Intake 25 ml  Output -  Net 25 ml   Filed Weights   05/30/17 0601 05/24/2017 0552 06/01/17 0500  Weight: 119.7 kg (264 lb) 119.7 kg (264 lb) 122 kg (269 lb)    Examination:  General exam: Appears calm and comfortable ,Not in distress,obese HEENT:PERRL,Oral mucosa moist, Ear/Nose normal on gross exam Respiratory  system: Bilateral equal air entry, normal vesicular breath sounds, no wheezes or crackles  Cardiovascular system: S1 & S2 heard, RRR. No JVD, murmurs, rubs, gallops or clicks, right-sided tunneled catheter, right brachiocephalic AV fistula Gastrointestinal system: Abdomen is nondistended, soft and nontender. No  organomegaly or masses felt. Normal bowel sounds heard. Central nervous system: Alert and oriented.  Quadriplegia Extremities: Trace edema, no clubbing ,no cyanosis, distal peripheral pulses palpable.  Spasticity. Skin: Unstageable ulcers on the buttocks GU: Foley   Data Reviewed: I have personally reviewed following labs and imaging studies  CBC: Recent Labs  Lab 05/27/17 0520 05/28/17 0422 05/30/17 0811 05/28/2017 0237 06/01/17 0923  WBC 7.9 7.2 7.9 6.9 9.8  NEUTROABS 6.6  --  6.5 5.5 8.8*  HGB 8.5* 7.7* 8.1* 7.9* 7.4*  HCT 25.2* 22.9* 24.1* 24.2* 21.9*  MCV 86.3 86.4 88.0 87.1 88.0  PLT 33* 75* 58* 43* 49*   Basic Metabolic Panel: Recent Labs  Lab 05/27/17 0520 05/28/17 0422 05/29/17 0316 05/30/17 0811 05/28/2017 0237 06/01/17 0923  NA 136 137  136 138 136 138 137  K 4.5 4.7  4.7 4.8 4.4 4.3 4.9  CL 100* 95*  100* 99* 100* 101 102  CO2 26 25  25 27 28 27  21*  GLUCOSE 146* 173*  176* 216* 84 113* 207*  BUN 47* 54*  56* 65* 38* 44* 50*  CREATININE 3.95* 4.42*  4.46* 4.72* 3.13* 3.54* 4.09*  CALCIUM 7.0* 7.1*  6.9* 6.8* 6.8* 6.7* 6.7*  MG 1.7  --   --   --   --   --   PHOS 3.8 3.9 3.8 3.4  --  5.1*   GFR: Estimated Creatinine Clearance: 30.8 mL/min (A) (by C-G formula based on SCr of 4.09 mg/dL (H)). Liver Function Tests: Recent Labs  Lab 05/27/17 0520 05/28/17 0422 05/29/17 0316 05/30/17 0811 06/01/17 0923  AST 25  --   --   --   --   ALT 53  --   --   --   --   ALKPHOS 130*  --   --   --   --   BILITOT 0.8  --   --   --   --   PROT 4.6*  --   --   --   --   ALBUMIN 2.1* 2.1* 2.0* 2.0* 1.8*   No results for input(s): LIPASE, AMYLASE in the last 168 hours. No results for input(s): AMMONIA in the last 168 hours. Coagulation Profile: Recent Labs  Lab 05/28/17 0422 05/23/2017 0237  INR 1.22 1.31   Cardiac Enzymes: No results for input(s): CKTOTAL, CKMB, CKMBINDEX, TROPONINI in the last 168 hours. BNP (last 3 results) No results for input(s):  PROBNP in the last 8760 hours. HbA1C: No results for input(s): HGBA1C in the last 72 hours. CBG: Recent Labs  Lab 05/11/2017 1146 05/24/2017 1618 05/27/2017 2144 06/01/17 0619 06/01/17 1144  GLUCAP 118* 167* 196* 214* 205*   Lipid Profile: No results for input(s): CHOL, HDL, LDLCALC, TRIG, CHOLHDL, LDLDIRECT in the last 72 hours. Thyroid Function Tests: No results for input(s): TSH, T4TOTAL, FREET4, T3FREE, THYROIDAB in the last 72 hours. Anemia Panel: No results for input(s): VITAMINB12, FOLATE, FERRITIN, TIBC, IRON, RETICCTPCT in the last 72 hours. Sepsis Labs: No results for input(s): PROCALCITON, LATICACIDVEN in the last 168 hours.  Recent Results (from the past 240 hour(s))  Culture, Urine     Status: Abnormal (Preliminary result)   Collection Time: 05/29/17  3:38 AM  Result Value  Ref Range Status   Specimen Description URINE, CATHETERIZED  Final   Special Requests NONE  Final   Culture (A)  Final    >=100,000 COLONIES/mL ESCHERICHIA COLI CULTURE REINCUBATED FOR BETTER GROWTH Performed at [facility], [address], [place], [place] [address]    Report Status PENDING  Incomplete         Radiology Studies: Dg Chest Port 1 View  Result Date: 05/25/2017 CLINICAL DATA:  Right dialysis catheter placement. EXAM: PORTABLE CHEST 1 VIEW COMPARISON:  05/15/2017 FINDINGS: Right dialysis catheter tip in the upper right atrium. No pneumothorax. Heart is upper limits normal in size. Mild perihilar and lower lobe airspace opacities, right greater than left, improved on the left since prior study, likely not significantly changed on the right. Favor asymmetric edema. No effusions. No acute bony abnormality. IMPRESSION: Right dialysis catheter placement.  No pneumothorax. Bilateral perihilar and lower lobe opacities, improved on the left since prior study, likely improving asymmetric edema. Electronically Signed   By: Charlett Nose M.D.   On: 05/24/2017 12:00   Dg Fluoro Guide Cv  Line-no Report  Result Date: 05/21/2017 Fluoroscopy was utilized by the requesting physician.  No radiographic interpretation.        Scheduled Meds: . amiodarone  200 mg Oral Daily  . collagenase   Topical Daily  . [START ON 06/08/2017] darbepoetin (ARANESP) injection - DIALYSIS  200 mcg Intravenous Q Tue-HD  . dexamethasone  3 mg Oral Q8H  . diltiazem  120 mg Oral QHS  . feeding supplement (ENSURE ENLIVE)  237 mL Oral BID BM  . guaiFENesin  600 mg Oral BID  . insulin aspart  0-5 Units Subcutaneous QHS  . insulin aspart  0-9 Units Subcutaneous TID WC  . insulin aspart  2 Units Subcutaneous TID WC  . insulin glargine  12 Units Subcutaneous Daily  . metoprolol tartrate  12.5 mg Oral BID  . mirtazapine  30 mg Oral QHS  . multivitamin  1 tablet Oral QHS   Continuous Infusions: . sodium chloride    . sodium chloride    . sodium chloride    . sodium chloride    . cefTRIAXone (ROCEPHIN)  IV 1 g (06/01/17 1232)  . ferric gluconate (FERRLECIT/NULECIT) IV       LOS: 31 days    Time spent: 25 mins.More than 50% of that time was spent in counseling and/or coordination of care.      Burnadette Pop, MD [facility] Pager [phone]  If 7PM-7AM, please contact night-coverage [email or web address] Password [facility] 06/01/2017, 12:37 PM

## 2017-06-02 DIAGNOSIS — F4323 Adjustment disorder with mixed anxiety and depressed mood: Secondary | ICD-10-CM

## 2017-06-02 DIAGNOSIS — N390 Urinary tract infection, site not specified: Secondary | ICD-10-CM

## 2017-06-02 DIAGNOSIS — N186 End stage renal disease: Secondary | ICD-10-CM

## 2017-06-02 DIAGNOSIS — I70209 Unspecified atherosclerosis of native arteries of extremities, unspecified extremity: Secondary | ICD-10-CM

## 2017-06-02 DIAGNOSIS — N179 Acute kidney failure, unspecified: Secondary | ICD-10-CM

## 2017-06-02 LAB — CBC WITH DIFFERENTIAL/PLATELET
BAND NEUTROPHILS: 8 %
BASOS PCT: 0 %
Basophils Absolute: 0 10*3/uL (ref 0.0–0.1)
Blasts: 0 %
EOS ABS: 0 10*3/uL (ref 0.0–0.7)
EOS PCT: 0 %
HCT: 23.5 % — ABNORMAL LOW (ref 39.0–52.0)
HEMOGLOBIN: 7.9 g/dL — AB (ref 13.0–17.0)
LYMPHS ABS: 0.6 10*3/uL — AB (ref 0.7–4.0)
LYMPHS PCT: 6 %
MCH: 29.2 pg (ref 26.0–34.0)
MCHC: 33.6 g/dL (ref 30.0–36.0)
MCV: 86.7 fL (ref 78.0–100.0)
MONO ABS: 0.1 10*3/uL (ref 0.1–1.0)
Metamyelocytes Relative: 0 %
Monocytes Relative: 1 %
Myelocytes: 0 %
NEUTROS PCT: 85 %
NRBC: 0 /100{WBCs}
Neutro Abs: 8.8 10*3/uL — ABNORMAL HIGH (ref 1.7–7.7)
Other: 0 %
PLATELETS: 38 10*3/uL — AB (ref 150–400)
PROMYELOCYTES RELATIVE: 0 %
RBC: 2.71 MIL/uL — ABNORMAL LOW (ref 4.22–5.81)
RDW: 16 % — AB (ref 11.5–15.5)
WBC: 9.5 10*3/uL (ref 4.0–10.5)

## 2017-06-02 LAB — BASIC METABOLIC PANEL
Anion gap: 13 (ref 5–15)
BUN: 61 mg/dL — AB (ref 6–20)
CHLORIDE: 103 mmol/L (ref 101–111)
CO2: 23 mmol/L (ref 22–32)
Calcium: 6.6 mg/dL — ABNORMAL LOW (ref 8.9–10.3)
Creatinine, Ser: 4.41 mg/dL — ABNORMAL HIGH (ref 0.61–1.24)
GFR, EST AFRICAN AMERICAN: 17 mL/min — AB (ref >60)
GFR, EST NON AFRICAN AMERICAN: 14 mL/min — AB (ref >60)
Glucose, Bld: 190 mg/dL — ABNORMAL HIGH (ref 65–99)
POTASSIUM: 4.9 mmol/L (ref 3.5–5.1)
SODIUM: 139 mmol/L (ref 135–145)

## 2017-06-02 LAB — GLUCOSE, CAPILLARY
GLUCOSE-CAPILLARY: 178 mg/dL — AB (ref 65–99)
GLUCOSE-CAPILLARY: 221 mg/dL — AB (ref 65–99)
Glucose-Capillary: 112 mg/dL — ABNORMAL HIGH (ref 65–99)

## 2017-06-02 MED ORDER — SODIUM CHLORIDE 0.9 % IV SOLN
1.0000 g | INTRAVENOUS | Status: DC
  Filled 2017-06-02 (×2): qty 1

## 2017-06-02 MED ORDER — METOPROLOL TARTRATE 5 MG/5ML IV SOLN
5.0000 mg | INTRAVENOUS | Status: DC | PRN
  Administered 2017-06-02: 5 mg via INTRAVENOUS

## 2017-06-02 MED ORDER — METOPROLOL TARTRATE 5 MG/5ML IV SOLN
INTRAVENOUS | Status: AC
  Administered 2017-06-02: 5 mg via INTRAVENOUS
  Filled 2017-06-02: qty 5

## 2017-06-02 MED ORDER — DARBEPOETIN ALFA 200 MCG/0.4ML IJ SOSY
PREFILLED_SYRINGE | INTRAMUSCULAR | Status: AC
  Administered 2017-06-02: 200 ug via INTRAVENOUS
  Filled 2017-06-02: qty 0.4

## 2017-06-02 MED ORDER — DARBEPOETIN ALFA 200 MCG/0.4ML IJ SOSY
200.0000 ug | PREFILLED_SYRINGE | INTRAMUSCULAR | Status: DC
  Administered 2017-06-02: 200 ug via INTRAVENOUS
  Filled 2017-06-02: qty 0.4

## 2017-06-02 NOTE — Procedures (Signed)
I was present at this session.  I have reviewed the session itself and made appropriate changes.  HD via R IJ PC bp 110-120.   Dylan [name] 5/1/20193:08 PM

## 2017-06-02 NOTE — Progress Notes (Signed)
Spoke with Ardelia Mems with [facility]. They are reviewing patients information faxed yesterday but requesting additional lab work and notes faxed to them. CM faxed information and provided Mr [place] CSW phone number as contact for next several days.

## 2017-06-02 NOTE — Progress Notes (Signed)
Charge nurse attempted to call patient's sister, Deloris Laural Benes at her request. No answer on phone and not able to leave message. Lawson Radar

## 2017-06-02 NOTE — Progress Notes (Signed)
Patient refused CPAP at this time.

## 2017-06-02 NOTE — Progress Notes (Signed)
ANTIBIOTIC CONSULT NOTE - INITIAL  Pharmacy Consult for cefepime Indication: UTI  No Known Allergies  Patient Measurements: Height: 6\' 4"  (193 cm) Weight: 280 lb (127 kg) IBW/kg (Calculated) : 86.8   Vital Signs: Temp: 98 F (36.7 C) (05/01 1149) Temp Source: Axillary (05/01 1149) BP: 130/80 (05/01 1149) Pulse Rate: 75 (05/01 1149) Intake/Output from previous day: 04/30 0701 - 05/01 0700 In: 1025 [P.O.:360; I.V.:250; Blood:315; IV Piggyback:100] Out: 25 [Urine:25] Intake/Output from this shift: Total I/O In: 480 [P.O.:480] Out: -   Labs: Recent Labs    05/03/2017 0237 06/01/17 0923 06/02/17 0642  WBC 6.9 9.8 9.5  HGB 7.9* 7.4* 7.9*  PLT 43* 49* 38*  CREATININE 3.54* 4.09* 4.41*   Estimated Creatinine Clearance: 29.2 mL/min (A) (by C-G formula based on SCr of 4.41 mg/dL (H)). No results for input(s): VANCOTROUGH, VANCOPEAK, VANCORANDOM, GENTTROUGH, GENTPEAK, GENTRANDOM, TOBRATROUGH, TOBRAPEAK, TOBRARND, AMIKACINPEAK, AMIKACINTROU, AMIKACIN in the last 72 hours.   Microbiology: Recent Results (from the past 720 hour(s))  Culture, blood (routine x 2)     Status: None   Collection Time: 05/06/17  2:33 PM  Result Value Ref Range Status   Specimen Description BLOOD LEFT HAND  Final   Special Requests   Final    BOTTLES DRAWN AEROBIC ONLY Blood Culture adequate volume   Culture   Final    NO GROWTH 5 DAYS Performed at [facility], [address] [place], [place] [address]    Report Status 05/11/2017 FINAL  Final  Culture, blood (routine x 2)     Status: None   Collection Time: 05/06/17  2:40 PM  Result Value Ref Range Status   Specimen Description BLOOD LEFT WRIST  Final   Special Requests   Final    BOTTLES DRAWN AEROBIC ONLY Blood Culture adequate volume   Culture   Final    NO GROWTH 5 DAYS Performed at [facility], [address] [place], [place] [address]    Report Status 05/11/2017 FINAL  Final  MRSA PCR Screening     Status: None   Collection Time: 05/20/17 11:31 AM  Result Value Ref Range Status   MRSA by PCR NEGATIVE NEGATIVE Final    Comment:        The GeneXpert MRSA Assay (FDA approved for NASAL specimens only), is one component of a comprehensive MRSA colonization surveillance program. It is not intended to diagnose MRSA infection nor to guide or monitor treatment for MRSA infections. Performed at [facility], [address] [place], [place] [address]   Culture, Urine     Status: Abnormal (Preliminary result)   Collection Time: 05/29/17  3:38 AM  Result Value Ref Range Status   Specimen Description URINE, CATHETERIZED  Final   Special Requests   Final    NONE Performed at [facility], [address] [place], [place] [address]    Culture (A)  Final    >=100,000 COLONIES/mL ESCHERICHIA COLI >=100,000 COLONIES/mL PSEUDOMONAS AERUGINOSA    Report Status PENDING  Incomplete    Medical History: Past Medical History:  Diagnosis Date  . 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 Diabetes mellitus without complication (HCC)   . Hypertension   . MS (congenital mitral stenosis) 2019  . Persistent atrial fibrillation with rapid ventricular response (HCC) 03/27/2017     Assessment: 52 yo male with PMH quadriplegia, MS, spinal stenosis admitted for AKI. On HD, to be scheduled per Nephrology note. Will dose for GFR <10  ml/min. Will change to with HD once patient noted to be on a routine HD schedule.     Plan:  Cefepime 1gm IV q24h Monitor cultures/sensitivities, CBC, fever curve and LOT  Jaquesha Boroff A. Jeanella Craze, PharmD, BCPS Clinical Pharmacist [place] Pager: [phone]  06/02/2017,1:46 PM

## 2017-06-02 NOTE — Progress Notes (Signed)
Was in HD and noticed pt HR In the 140's toward the end of treatment- placed on monitor, seemed to be sinus - took off machine but only 5 min early- UF was 1800- gave some fluid back (250)  and will give IV lopressor 5 mg with orders to repeat times one - BP is OK 109/60- pt without symptoms.  When lopressor given it did slow down some but now appears to be more irreg- will report to floor nurse and give cardizem as soon as able   Garlin Batdorf A

## 2017-06-02 NOTE — Progress Notes (Signed)
Subjective: Interval History: has no complaint .  Objective: Vital signs in last 24 hours: Temp:  [97.5 F (36.4 C)-99.2 F (37.3 C)] 97.5 F (36.4 C) (05/01 0852) Pulse Rate:  [76-87] 80 (05/01 0852) Resp:  [17-20] 17 (05/01 0852) BP: (122-162)/(68-90) 137/88 (05/01 0852) SpO2:  [92 %-100 %] 100 % (05/01 0852) Weight:  [127 kg (280 lb)] 127 kg (280 lb) (05/01 0029) Weight change: 4.99 kg (11 lb)  Intake/Output from previous day: 04/30 0701 - 05/01 0700 In: 1025 [P.O.:360; I.V.:250; Blood:315; IV Piggyback:100] Out: 25 [Urine:25] Intake/Output this shift: No intake/output data recorded.  General appearance: moderately obese and lethargic, falls asleep Resp: diminished breath sounds bilaterally Chest wall: RIJ cath Cardio: S1, S2 normal and systolic murmur: systolic ejection 2/6, decrescendo at 2nd left intercostal space GI: soft, ? SP mass, foley Extremities: boots on feet, braces hands and feet AVF RUA Lab Results: Recent Labs    06/01/17 0923 06/02/17 0642  WBC 9.8 9.5  HGB 7.4* 7.9*  HCT 21.9* 23.5*  PLT 49* 38*   BMET:  Recent Labs    06/01/17 0923 06/02/17 0642  NA 137 139  K 4.9 4.9  CL 102 103  CO2 21* 23  GLUCOSE 207* 190*  BUN 50* 61*  CREATININE 4.09* 4.41*  CALCIUM 6.7* 6.6*   No results for input(s): PTH in the last 72 hours. Iron Studies: No results for input(s): IRON, TIBC, TRANSFERRIN, FERRITIN in the last 72 hours.  Studies/Results: Dg Chest Port 1 View  Result Date: 06/02/2017 CLINICAL DATA:  Right dialysis catheter placement. EXAM: PORTABLE CHEST 1 VIEW COMPARISON:  05/15/2017 FINDINGS: Right dialysis catheter tip in the upper right atrium. No pneumothorax. Heart is upper limits normal in size. Mild perihilar and lower lobe airspace opacities, right greater than left, improved on the left since prior study, likely not significantly changed on the right. Favor asymmetric edema. No effusions. No acute bony abnormality. IMPRESSION: Right  dialysis catheter placement.  No pneumothorax. Bilateral perihilar and lower lobe opacities, improved on the left since prior study, likely improving asymmetric edema. Electronically Signed   By: Charlett Nose M.D.   On: 06-02-2017 12:00   Dg Fluoro Guide Cv Line-no Report  Result Date: 06-02-2017 Fluoroscopy was utilized by the requesting physician.  No radiographic interpretation.    I have reviewed the patient's current medications.  Assessment/Plan: 1 AKI/?ESRD need to make sure foley not blocked again.  Will sched HD. 2 Quad 3 Anemia esa/fe 4 HPTH 5 Ovesity 6 DM P Scan bladder and act according ly, Hd, esa,  Not candidate for chair with decub    LOS: 32 days   Dylan [name] 06/02/2017,9:42 AM

## 2017-06-02 NOTE — Progress Notes (Signed)
PROGRESS NOTE  Dylan [name] ZOX:[id number] DOB: [birth year] DOA: 04/30/2017 PCP: Patient, No Pcp Per   LOS: 32 days   Brief Narrative / Interim history: Patient is a 52 year old male with past medical history significant for recent quadriplegia, multiple sclerosis, decompression of the spinal abscess, spinal stenosis who has been  admitted for the management of acute kidney injury.  He was recently admitted to neurosurgery on 04/12/2017 and had surgery(corpectomy/decompression surgery) on 03/25/17 for cord compression due to spinal stenosis at C6 ,C5 and C7.  Postoperative course was completed with atrial fibrillation, started on Eliquis but discontinued due to spinal hematoma. Also epidural clot versus abscess was suspected on his follow up  C-spine MRI. Because of the high risk patient decided not to repeat exploratory surgery.  He was also started on IV antibiotics due to complicated UTI ,possible cervical abscess and was discharged to skilled nursing facility on 04/09/2017.   His Foley catheter was not draining at the skilled nursing facility so he was sent to the emergency department.  Found to have acute kidney injury. Urology was also consulted, flushed his Foley with removal of foreign body yeilding 2.5 L of urine. Currently patient has been managed for acute kidney injury thought to be secondary to possible vancomycin related.  Nephrology following.  Patient hospital course is also complicated with sepsis, Candida glabrata fungal anemia, progressive oliguric renal failure, hyperkalemia , thrombocytopenia. Patient underwent right-sided tunneled catheter placement for hemodialysis and right brachiocephalic AV fistula on 06/01/17. His disposition will be challenging .Currently he may not be able to sit for dialysis due to ulcers on his buttocks and quadriplegia.  He might need SNF with dialysis facility out of state.  Social worker following.    Assessment & Plan: Principal Problem:   AKI  (acute kidney injury) (HCC) Active Problems:   Uncontrolled diabetes mellitus type 2 without complications (HCC)   Hypertension   Morbid obesity (HCC)   Paroxysmal atrial fibrillation (HCC)   Multiple sclerosis exacerbation (HCC)   Pressure injury of skin   Hyperkalemia   Hematoma   Insomnia   Adjustment reaction with anxiety and depression   Quadriplegia (HCC)   History of fusion of cervical spine   Postoperative wound infection   Coronary artery disease   Atherosclerotic peripheral vascular disease (HCC)   MDD (major depressive disorder), single episode, moderate (HCC)   Candidemia (HCC)   Goals of care, counseling/discussion   Palliative care by specialist   Acute lower UTI   Acute renal failure: Thought to be secondary to obstructive uropathy versus vancomycin-induced renal injury.  Nephrology is following.  He has been started on dialysis since 05/07/2017. Permanent  dialysis catheter placed on 05/15/2017.  There is also concern that patient might need stretcher dialysis which would be possible only out of state. S/P right brachiocephalic AV fistula per vascular surgeon  Severe thrombocytopenia: Thought to be secondary to drug related.  Anidulafungin, daptomycin and cefepime stopped.  Protonix also stopped.  HIT negative.  Patient does not receive heparin with dialysis.  Hematology was also consulted .He has received several units of platelets transfusion. Hematology feels that this is not likely immune medicine thrombocytopenia given patient response to transfusion.  -Dr. Clelia Croft following intermittently -Platelets 38 today, continue to closely monitor  GI bleed/acute blood loss anemia/acute on chronic anemia: Protonix stopped due to thrombocytopenia.  -Status post 2 units of PRBC few days ago.  No further bleeding noted. -GI was on board due to her large episode of hematochezia, self  resolved.  Recommending continue close monitoring.  Recommended to reconsult if rebleeding.  No  new episodes of hematochezia . -Hemoglobin 7.9 this morning, continue to closely monitor -GI reluctant to do any kind of procedures unless his platelets are more than 50,000.  Elevated troponin: Mild.  Did not trend up.  Patient denies any chest pain.  Likely supply demand ischemia  Quadriplegia/cervical abscess versus cervical hematoma/infected cervical hematoma: Patient completed 6 weeks of antibiotics including 4 weeks of vancomycin and cefepime followed by 2 weeks of daptomycin and cefepime.  Course completed on 05/16/2017. Neurosurgery was following before.  ID was following,signed off.  Neurogenic bladder: Patient was discharged with Foley catheter on 04/09/2017 in the context of new quadriplegia. Foley catheter exhanged on admission. Foley was taken out during this admission but had to be put back again due to urinary retention.  Continue Foley catheter .  UTI: Urine culture growing Ecoli as well as Pseudomonas.  Will follow final urine culture report. Change to cefepime today given new finding of Pseudomonas, today's day 6, will likely not need more than 7 days but will wait on sensitivities before making that decision  Candidemia: Patient had 10 days of  Anidulafungin which has been discontinued due to thrombocytopenia. Ophthalmology also consulted, no evidence of intraocular fungal infection.  Echocardiogram did not show any vegetation.  Diabetes type 2: Continue Lantus, sliding scale insulin.  We will continue monitor blood sugars.  Paroxysmal A. fib: No anticoagulation given his current thrombocytopenia.  Continue amiodarone, metoprolol and Cardizem.  Multiple sclerosis: Recently diagnosed on previous admission and he was given 5 days of high-dose steroids with improvement.  Neurology was consulted at that time.  Recommended to follow as an outpatient.   Adjustment disorder/depression: Continue mirtazapine.  Psychiatry was following.  Recommended  trazodone.  Hyponatremia/hypokalemia: Currently stable  Unstageable pressure injury on bilateral buttocks: Continue supportive care.  Continue wound care.  His pressure injury on bilateral buttocks is making him unable to sit for dialysis.  Nocturnal hypoxia: No history of OSA or the use of CPAP.  Continue supplemental oxygen as needed/CPAP at night.  Goals of care: Palliative care was consulted before.  Remains full code    DVT prophylaxis: SCD Code Status: Full code Family Communication: no family at bedside Disposition Plan: Skilled nursing facility after improvement in thrombocytopenia , hematology evaluation, nephrology clearance and finding of skilled nursing facility with stretcher dialysis facility  Consultants:   Nephrology   Hematology   Neurology  Psychiatry   ID  Ophthalmology  GI  Procedures:  Echocardiogram, upper external vein mapping, HD  Antimicrobials:  Ceftriaxone day 5   Prior antibiotics include daptomycin, Eraxis, cefepime, vancomycin, Zosyn  Subjective: -No complaints, no chest pain, no palpitations, no shortness of breath.  No abdominal pain, nausea or vomiting  Objective: Vitals:   06/02/17 0029 06/02/17 0414 06/02/17 0852 06/02/17 1149  BP: 130/90 122/68 137/88 130/80  Pulse: 76 77 80 75  Resp: 19 19 17 18   Temp: 98.7 F (37.1 C) 98.1 F (36.7 C) (!) 97.5 F (36.4 C) 98 F (36.7 C)  TempSrc: Oral Oral Axillary Axillary  SpO2: 100% 100% 100% 100%  Weight: 127 kg (280 lb)     Height:        Intake/Output Summary (Last 24 hours) at 06/02/2017 1233 Last data filed at 06/02/2017 0944 Gross per 24 hour  Intake 1165 ml  Output 25 ml  Net 1140 ml   Filed Weights   05/29/2017 0552 06/01/17 0500 06/02/17  0029  Weight: 119.7 kg (264 lb) 122 kg (269 lb) 127 kg (280 lb)    Examination:  Constitutional: NAD Eyes:  lids and conjunctivae normal ENMT: Mucous membranes are moist.  Neck: normal, supple Respiratory: clear to auscultation  bilaterally, no wheezing, no crackles. Cardiovascular: Regular rate and rhythm, no murmurs / rubs / gallops. No LE edema. 2+ pedal pulses. No carotid bruits.  Abdomen: no tenderness. Bowel sounds positive.  Skin: no rashes Neurologic: Quadriplegic.  Psychiatric: Normal judgment and insight. Alert and oriented x 3.    Data Reviewed: I have independently reviewed following labs and imaging studies   CBC: Recent Labs  Lab 05/27/17 0520 05/28/17 0422 05/30/17 0811 05/20/2017 0237 06/01/17 0923 06/02/17 0642  WBC 7.9 7.2 7.9 6.9 9.8 9.5  NEUTROABS 6.6  --  6.5 5.5 8.8* 8.8*  HGB 8.5* 7.7* 8.1* 7.9* 7.4* 7.9*  HCT 25.2* 22.9* 24.1* 24.2* 21.9* 23.5*  MCV 86.3 86.4 88.0 87.1 88.0 86.7  PLT 33* 75* 58* 43* 49* 38*   Basic Metabolic Panel: Recent Labs  Lab 05/27/17 0520 05/28/17 0422 05/29/17 0316 05/30/17 0811 05/22/2017 0237 06/01/17 0923 06/02/17 0642  NA 136 137  136 138 136 138 137 139  K 4.5 4.7  4.7 4.8 4.4 4.3 4.9 4.9  CL 100* 95*  100* 99* 100* 101 102 103  CO2 26 25  25 27 28 27  21* 23  GLUCOSE 146* 173*  176* 216* 84 113* 207* 190*  BUN 47* 54*  56* 65* 38* 44* 50* 61*  CREATININE 3.95* 4.42*  4.46* 4.72* 3.13* 3.54* 4.09* 4.41*  CALCIUM 7.0* 7.1*  6.9* 6.8* 6.8* 6.7* 6.7* 6.6*  MG 1.7  --   --   --   --   --   --   PHOS 3.8 3.9 3.8 3.4  --  5.1*  --    GFR: Estimated Creatinine Clearance: 29.2 mL/min (A) (by C-G formula based on SCr of 4.41 mg/dL (H)). Liver Function Tests: Recent Labs  Lab 05/27/17 0520 05/28/17 0422 05/29/17 0316 05/30/17 0811 06/01/17 0923  AST 25  --   --   --   --   ALT 53  --   --   --   --   ALKPHOS 130*  --   --   --   --   BILITOT 0.8  --   --   --   --   PROT 4.6*  --   --   --   --   ALBUMIN 2.1* 2.1* 2.0* 2.0* 1.8*   No results for input(s): LIPASE, AMYLASE in the last 168 hours. No results for input(s): AMMONIA in the last 168 hours. Coagulation Profile: Recent Labs  Lab 05/28/17 0422 05/13/2017 0237  INR 1.22  1.31   Cardiac Enzymes: No results for input(s): CKTOTAL, CKMB, CKMBINDEX, TROPONINI in the last 168 hours. BNP (last 3 results) No results for input(s): PROBNP in the last 8760 hours. HbA1C: No results for input(s): HGBA1C in the last 72 hours. CBG: Recent Labs  Lab 06/01/17 1144 06/01/17 1650 06/01/17 2148 06/02/17 0610 06/02/17 1148  GLUCAP 205* 126* 144* 178* 221*   Lipid Profile: No results for input(s): CHOL, HDL, LDLCALC, TRIG, CHOLHDL, LDLDIRECT in the last 72 hours. Thyroid Function Tests: No results for input(s): TSH, T4TOTAL, FREET4, T3FREE, THYROIDAB in the last 72 hours. Anemia Panel: No results for input(s): VITAMINB12, FOLATE, FERRITIN, TIBC, IRON, RETICCTPCT in the last 72 hours. Urine analysis:    Component Value Date/Time  COLORURINE YELLOW 05/29/2017 0340   APPEARANCEUR CLOUDY (A) 05/29/2017 0340   LABSPEC 1.009 05/29/2017 0340   PHURINE 6.0 05/29/2017 0340   GLUCOSEU NEGATIVE 05/29/2017 0340   HGBUR MODERATE (A) 05/29/2017 0340   BILIRUBINUR NEGATIVE 05/29/2017 0340   KETONESUR NEGATIVE 05/29/2017 0340   PROTEINUR 100 (A) 05/29/2017 0340   NITRITE NEGATIVE 05/29/2017 0340   LEUKOCYTESUR LARGE (A) 05/29/2017 0340   Sepsis Labs: Invalid input(s): PROCALCITONIN, LACTICIDVEN  Recent Results (from the past 240 hour(s))  Culture, Urine     Status: Abnormal (Preliminary result)   Collection Time: 05/29/17  3:38 AM  Result Value Ref Range Status   Specimen Description URINE, CATHETERIZED  Final   Special Requests   Final    NONE Performed at [facility], [address] [place], [place] [address]    Culture (A)  Final    >=100,000 COLONIES/mL ESCHERICHIA COLI >=100,000 COLONIES/mL PSEUDOMONAS AERUGINOSA    Report Status PENDING  Incomplete      Radiology Studies: No results found.   Scheduled Meds: . amiodarone  200 mg Oral Daily  . collagenase   Topical Daily  . [START ON 06/08/2017] darbepoetin (ARANESP) injection - DIALYSIS  200  mcg Intravenous Q Tue-HD  . dexamethasone  3 mg Oral Q8H  . diltiazem  120 mg Oral QHS  . feeding supplement (ENSURE ENLIVE)  237 mL Oral BID BM  . guaiFENesin  600 mg Oral BID  . insulin aspart  0-5 Units Subcutaneous QHS  . insulin aspart  0-9 Units Subcutaneous TID WC  . insulin aspart  2 Units Subcutaneous TID WC  . insulin glargine  12 Units Subcutaneous Daily  . metoprolol tartrate  12.5 mg Oral BID  . mirtazapine  30 mg Oral QHS  . multivitamin  1 tablet Oral QHS   Continuous Infusions: . sodium chloride    . sodium chloride    . sodium chloride    . cefTRIAXone (ROCEPHIN)  IV Stopped (06/01/17 1325)  . ferric gluconate (FERRLECIT/NULECIT) IV      Pamella Pert, MD, PhD [facility] Pager [phone]  If 7PM-7AM, please contact night-coverage [email or web address] Password [facility] 06/02/2017, 12:33 PM

## 2017-06-02 DEATH — deceased

## 2017-06-03 LAB — URINE CULTURE

## 2017-06-03 LAB — BASIC METABOLIC PANEL
ANION GAP: 10 (ref 5–15)
BUN: 24 mg/dL — ABNORMAL HIGH (ref 6–20)
CHLORIDE: 100 mmol/L — AB (ref 101–111)
CO2: 28 mmol/L (ref 22–32)
CREATININE: 2.22 mg/dL — AB (ref 0.61–1.24)
Calcium: 6.5 mg/dL — ABNORMAL LOW (ref 8.9–10.3)
GFR calc non Af Amer: 33 mL/min — ABNORMAL LOW (ref >60)
GFR, EST AFRICAN AMERICAN: 38 mL/min — AB (ref >60)
Glucose, Bld: 114 mg/dL — ABNORMAL HIGH (ref 65–99)
POTASSIUM: 3.6 mmol/L (ref 3.5–5.1)
SODIUM: 138 mmol/L (ref 135–145)

## 2017-06-03 LAB — GLUCOSE, CAPILLARY
GLUCOSE-CAPILLARY: 154 mg/dL — AB (ref 65–99)
Glucose-Capillary: 126 mg/dL — ABNORMAL HIGH (ref 65–99)
Glucose-Capillary: 146 mg/dL — ABNORMAL HIGH (ref 65–99)
Glucose-Capillary: 167 mg/dL — ABNORMAL HIGH (ref 65–99)

## 2017-06-03 LAB — CBC
HCT: 23.7 % — ABNORMAL LOW (ref 39.0–52.0)
HEMOGLOBIN: 7.9 g/dL — AB (ref 13.0–17.0)
MCH: 29 pg (ref 26.0–34.0)
MCHC: 33.3 g/dL (ref 30.0–36.0)
MCV: 87.1 fL (ref 78.0–100.0)
Platelets: 37 10*3/uL — ABNORMAL LOW (ref 150–400)
RBC: 2.72 MIL/uL — AB (ref 4.22–5.81)
RDW: 15.8 % — ABNORMAL HIGH (ref 11.5–15.5)
WBC: 12 10*3/uL — AB (ref 4.0–10.5)

## 2017-06-03 MED ORDER — CIPROFLOXACIN HCL 500 MG PO TABS
500.0000 mg | ORAL_TABLET | Freq: Every day | ORAL | Status: DC
Start: 2017-06-03 — End: 2017-06-04
  Administered 2017-06-03: 500 mg via ORAL
  Filled 2017-06-03: qty 1

## 2017-06-03 NOTE — Progress Notes (Signed)
PROGRESS NOTE  AKAI DOLLARD CZY:[id number] DOB: 1965-09-29 DOA: 04/11/2017 PCP: Patient, No Pcp Per   LOS: 33 days   Brief Narrative / Interim history: Patient is a 52 year old male with past medical history significant for recent quadriplegia, multiple sclerosis, decompression of the spinal abscess, spinal stenosis who has been  admitted for the management of acute kidney injury.  He was recently admitted to neurosurgery on 04/12/2017 and had surgery(corpectomy/decompression surgery) on 03/25/17 for cord compression due to spinal stenosis at C6 ,C5 and C7.  Postoperative course was completed with atrial fibrillation, started on Eliquis but discontinued due to spinal hematoma. Also epidural clot versus abscess was suspected on his follow up  C-spine MRI. Because of the high risk patient decided not to repeat exploratory surgery.  He was also started on IV antibiotics due to complicated UTI ,possible cervical abscess and was discharged to skilled nursing facility on 04/09/2017.   His Foley catheter was not draining at the skilled nursing facility so he was sent to the emergency department.  Found to have acute kidney injury. Urology was also consulted, flushed his Foley with removal of foreign body yeilding 2.5 L of urine. Currently patient has been managed for acute kidney injury thought to be secondary to possible vancomycin related.  Nephrology following.  Patient hospital course is also complicated with sepsis, Candida glabrata fungal anemia, progressive oliguric renal failure, hyperkalemia , thrombocytopenia. Patient underwent right-sided tunneled catheter placement for hemodialysis and right brachiocephalic AV fistula on 06/01/17. His disposition will be challenging .Currently he may not be able to sit for dialysis due to ulcers on his buttocks and quadriplegia.  He might need SNF with dialysis facility out of state.  Social worker following.    Assessment & Plan: Principal Problem:   AKI  (acute kidney injury) (HCC) Active Problems:   Uncontrolled diabetes mellitus type 2 without complications (HCC)   Hypertension   Morbid obesity (HCC)   Paroxysmal atrial fibrillation (HCC)   Multiple sclerosis exacerbation (HCC)   Pressure injury of skin   Hyperkalemia   Hematoma   Insomnia   Adjustment reaction with anxiety and depression   Quadriplegia (HCC)   History of fusion of cervical spine   Postoperative wound infection   Coronary artery disease   Atherosclerotic peripheral vascular disease (HCC)   MDD (major depressive disorder), single episode, moderate (HCC)   Candidemia (HCC)   Goals of care, counseling/discussion   Palliative care by specialist   Acute lower UTI   Acute renal failure: Thought to be secondary to obstructive uropathy versus vancomycin-induced renal injury.  Nephrology is following.  He has been started on dialysis since 05/07/2017. Permanent  dialysis catheter placed on 06-30-17.  There is also concern that patient might need stretcher dialysis which would be possible only out of state. S/P right brachiocephalic AV fistula per vascular surgeon D/w Dr. Darrick Penna today, patient is making more urine and there is some hope for recovery  Severe thrombocytopenia: Thought to be secondary to drug related.  Anidulafungin, daptomycin and cefepime stopped.  Protonix also stopped.  HIT negative.  Patient does not receive heparin with dialysis.  Hematology was also consulted .He has received several units of platelets transfusion. Hematology feels that this is not likely immune medicine thrombocytopenia given patient response to transfusion.  -Dr. Clelia Croft following intermittently -Platelets 37 today, overall unchanged since yesterday, continue to closely monitor  GI bleed/acute blood loss anemia/acute on chronic anemia: Protonix stopped due to thrombocytopenia.  -Status post 2 units of PRBC  few days ago.  No further bleeding noted. -GI was on board due to her large  episode of hematochezia, self resolved.  Recommending continue close monitoring.  Recommended to reconsult if rebleeding.  No new episodes of hematochezia . -Hemoglobin 7.9 this morning, continue to closely monitor -GI reluctant to do any kind of procedures unless his platelets are more than 50,000.  Elevated troponin: Mild.  Did not trend up.  Patient denies any chest pain.  Likely supply demand ischemia  Quadriplegia/cervical abscess versus cervical hematoma/infected cervical hematoma: Patient completed 6 weeks of antibiotics including 4 weeks of vancomycin and cefepime followed by 2 weeks of daptomycin and cefepime.  Course completed on 05/16/2017. Neurosurgery was following before.  ID was following,signed off.  Neurogenic bladder: Patient was discharged with Foley catheter on 04/09/2017 in the context of new quadriplegia. Foley catheter exhanged on admission. Foley was taken out during this admission but had to be put back again due to urinary retention.  Continue Foley catheter .  UTI: Urine culture growing Ecoli as well as Pseudomonas. Pseudomonas is resistant to Cefepime. Change to Cipro today, plan for total of 5 days.  Candidemia: Patient had 10 days of  Anidulafungin which has been discontinued due to thrombocytopenia. Ophthalmology also consulted, no evidence of intraocular fungal infection.  Echocardiogram did not show any vegetation.  Diabetes type 2: Continue Lantus, sliding scale insulin.  We will continue monitor blood sugars. CBGs 110-120s this morning  Paroxysmal A. fib: No anticoagulation given his current thrombocytopenia.  Continue amiodarone, metoprolol and Cardizem. -sinus on telemetry   Multiple sclerosis: Recently diagnosed on previous admission and he was given 5 days of high-dose steroids with improvement.  Neurology was consulted at that time.  Recommended to follow as an outpatient.   Adjustment disorder/depression: Continue mirtazapine.  Psychiatry was  following.  Recommended trazodone.  Hyponatremia/hypokalemia: Currently stable  Unstageable pressure injury on bilateral buttocks: Continue supportive care.  Continue wound care.  His pressure injury on bilateral buttocks is making him unable to sit for dialysis.  Nocturnal hypoxia: No history of OSA or the use of CPAP.  Continue supplemental oxygen as needed/CPAP at night.  Goals of care: Palliative care was consulted before.  Remains full code    DVT prophylaxis: SCD Code Status: Full code Family Communication: no family at bedside, plan for family meeting later today  Disposition Plan: Skilled nursing facility after improvement in thrombocytopenia , hematology evaluation, nephrology clearance and finding of skilled nursing facility with stretcher dialysis facility  Consultants:   Nephrology   Hematology   Neurology  Psychiatry   ID  Ophthalmology  GI  Procedures:  Echocardiogram, upper external vein mapping, HD  Antimicrobials:  Ceftriaxone day 5   Prior antibiotics include daptomycin, Eraxis, cefepime, vancomycin, Zosyn  Subjective: -feeling well, no chest pain, no dyspnea. No abdominal pain, nausea or vomiting   Objective: Vitals:   06/02/17 2105 06/03/17 0013 06/03/17 0415 06/03/17 0743  BP:  136/88 (!) 146/82 (!) 165/84  Pulse: (!) 110 (!) 122 70 74  Resp: 20 (!) 22 18 16   Temp:  98.3 F (36.8 C) 97.6 F (36.4 C) 98.3 F (36.8 C)  TempSrc:  Oral Oral Oral  SpO2: 96% 98% 100% 100%  Weight:      Height:        Intake/Output Summary (Last 24 hours) at 06/03/2017 1026 Last data filed at 06/02/2017 1900 Gross per 24 hour  Intake 240 ml  Output 2702 ml  Net -2462 ml   Filed  Weights   06/01/17 0500 06/02/17 0029  Weight: 122 kg (269 lb) 127 kg (280 lb)    Examination:  Constitutional: NAD Neck: normal, supple Respiratory: CTA biL, no wheezing, no crackles  Cardiovascular: RRR without mrg, no edema   Abdomen: soft, NT, ND, BS+ Skin: no rashes    Neurologic: quad Psychiatric: AxOx3   Data Reviewed: I have independently reviewed following labs and imaging studies   CBC: Recent Labs  Lab 05/30/17 0811 06/18/17 0237 06/01/17 0923 06/02/17 0642 06/03/17 0500  WBC 7.9 6.9 9.8 9.5 12.0*  NEUTROABS 6.5 5.5 8.8* 8.8*  --   HGB 8.1* 7.9* 7.4* 7.9* 7.9*  HCT 24.1* 24.2* 21.9* 23.5* 23.7*  MCV 88.0 87.1 88.0 86.7 87.1  PLT 58* 43* 49* 38* 37*   Basic Metabolic Panel: Recent Labs  Lab 05/28/17 0422 05/29/17 0316 05/30/17 0811 06/18/2017 0237 06/01/17 0923 06/02/17 0642 06/03/17 0500  NA 137  136 138 136 138 137 139 138  K 4.7  4.7 4.8 4.4 4.3 4.9 4.9 3.6  CL 95*  100* 99* 100* 101 102 103 100*  CO2 25  25 27 28 27  21* 23 28  GLUCOSE 173*  176* 216* 84 113* 207* 190* 114*  BUN 54*  56* 65* 38* 44* 50* 61* 24*  CREATININE 4.42*  4.46* 4.72* 3.13* 3.54* 4.09* 4.41* 2.22*  CALCIUM 7.1*  6.9* 6.8* 6.8* 6.7* 6.7* 6.6* 6.5*  PHOS 3.9 3.8 3.4  --  5.1*  --   --    GFR: Estimated Creatinine Clearance: 57.9 mL/min (A) (by C-G formula based on SCr of 2.22 mg/dL (H)). Liver Function Tests: Recent Labs  Lab 05/28/17 0422 05/29/17 0316 05/30/17 0811 06/01/17 0923  ALBUMIN 2.1* 2.0* 2.0* 1.8*   No results for input(s): LIPASE, AMYLASE in the last 168 hours. No results for input(s): AMMONIA in the last 168 hours. Coagulation Profile: Recent Labs  Lab 05/28/17 0422 06/18/2017 0237  INR 1.22 1.31   Cardiac Enzymes: No results for input(s): CKTOTAL, CKMB, CKMBINDEX, TROPONINI in the last 168 hours. BNP (last 3 results) No results for input(s): PROBNP in the last 8760 hours. HbA1C: No results for input(s): HGBA1C in the last 72 hours. CBG: Recent Labs  Lab 06/01/17 2148 06/02/17 0610 06/02/17 1148 06/02/17 2156 06/03/17 0640  GLUCAP 144* 178* 221* 112* 126*   Lipid Profile: No results for input(s): CHOL, HDL, LDLCALC, TRIG, CHOLHDL, LDLDIRECT in the last 72 hours. Thyroid Function Tests: No results for  input(s): TSH, T4TOTAL, FREET4, T3FREE, THYROIDAB in the last 72 hours. Anemia Panel: No results for input(s): VITAMINB12, FOLATE, FERRITIN, TIBC, IRON, RETICCTPCT in the last 72 hours. Urine analysis:    Component Value Date/Time   COLORURINE YELLOW 05/29/2017 0340   APPEARANCEUR CLOUDY (A) 05/29/2017 0340   LABSPEC 1.009 05/29/2017 0340   PHURINE 6.0 05/29/2017 0340   GLUCOSEU NEGATIVE 05/29/2017 0340   HGBUR MODERATE (A) 05/29/2017 0340   BILIRUBINUR NEGATIVE 05/29/2017 0340   KETONESUR NEGATIVE 05/29/2017 0340   PROTEINUR 100 (A) 05/29/2017 0340   NITRITE NEGATIVE 05/29/2017 0340   LEUKOCYTESUR LARGE (A) 05/29/2017 0340   Sepsis Labs: Invalid input(s): PROCALCITONIN, LACTICIDVEN  Recent Results (from the past 240 hour(s))  Culture, Urine     Status: Abnormal   Collection Time: 05/29/17  3:38 AM  Result Value Ref Range Status   Specimen Description URINE, CATHETERIZED  Final   Special Requests   Final    NONE Performed at [facility], [address] [place],   [address]    Culture (A)  Final    >=100,000 COLONIES/mL ESCHERICHIA COLI >=100,000 COLONIES/mL PSEUDOMONAS AERUGINOSA 20,000 COLONIES/mL STAPHYLOCOCCUS AUREUS    Report Status 06/03/2017 FINAL  Final   Organism ID, Bacteria ESCHERICHIA COLI (A)  Final   Organism ID, Bacteria PSEUDOMONAS AERUGINOSA (A)  Final   Organism ID, Bacteria STAPHYLOCOCCUS AUREUS (A)  Final      Susceptibility   Escherichia coli - MIC*    AMPICILLIN >=32 RESISTANT Resistant     CEFAZOLIN <=4 SENSITIVE Sensitive     CEFTRIAXONE <=1 SENSITIVE Sensitive     CIPROFLOXACIN <=0.25 SENSITIVE Sensitive     GENTAMICIN <=1 SENSITIVE Sensitive     IMIPENEM <=0.25 SENSITIVE Sensitive     NITROFURANTOIN <=16 SENSITIVE Sensitive     TRIMETH/SULFA <=20 SENSITIVE Sensitive     AMPICILLIN/SULBACTAM 4 SENSITIVE Sensitive     PIP/TAZO <=4 SENSITIVE Sensitive     * >=100,000 COLONIES/mL ESCHERICHIA COLI   Pseudomonas aeruginosa - MIC*     CEFTAZIDIME >=64 RESISTANT Resistant     CIPROFLOXACIN <=0.25 SENSITIVE Sensitive     GENTAMICIN 4 SENSITIVE Sensitive     IMIPENEM 2 SENSITIVE Sensitive     CEFEPIME 32 RESISTANT Resistant     * >=100,000 COLONIES/mL PSEUDOMONAS AERUGINOSA   Staphylococcus aureus - MIC*    CIPROFLOXACIN <=0.5 SENSITIVE Sensitive     GENTAMICIN <=0.5 SENSITIVE Sensitive     NITROFURANTOIN <=16 SENSITIVE Sensitive     OXACILLIN <=0.25 SENSITIVE Sensitive     TETRACYCLINE <=1 SENSITIVE Sensitive     VANCOMYCIN 1 SENSITIVE Sensitive     TRIMETH/SULFA <=10 SENSITIVE Sensitive     CLINDAMYCIN <=0.25 SENSITIVE Sensitive     RIFAMPIN <=0.5 SENSITIVE Sensitive     Inducible Clindamycin NEGATIVE Sensitive     * 20,000 COLONIES/mL STAPHYLOCOCCUS AUREUS      Radiology Studies: No results found.   Scheduled Meds: . amiodarone  200 mg Oral Daily  . collagenase   Topical Daily  . darbepoetin (ARANESP) injection - DIALYSIS  200 mcg Intravenous Q Wed-HD  . dexamethasone  3 mg Oral Q8H  . diltiazem  120 mg Oral QHS  . feeding supplement (ENSURE ENLIVE)  237 mL Oral BID BM  . guaiFENesin  600 mg Oral BID  . insulin aspart  0-5 Units Subcutaneous QHS  . insulin aspart  0-9 Units Subcutaneous TID WC  . insulin aspart  2 Units Subcutaneous TID WC  . insulin glargine  12 Units Subcutaneous Daily  . metoprolol tartrate  12.5 mg Oral BID  . mirtazapine  30 mg Oral QHS  . multivitamin  1 tablet Oral QHS   Continuous Infusions: . sodium chloride    . sodium chloride    . sodium chloride    . ceFEPime (MAXIPIME) IV    . ferric gluconate (FERRLECIT/NULECIT) IV      Pamella Pert, MD, PhD [facility] Pager [phone]  If 7PM-7AM, please contact night-coverage [email or web address] Password [email or web address] 06/03/2017, 10:26 AM

## 2017-06-03 NOTE — Progress Notes (Signed)
Late entry for 06/02/17 @ 19:58 Patient back from dialysis. 1.3L . Patient appears comfortable. Will monitor.

## 2017-06-03 NOTE — Progress Notes (Signed)
Occupational Therapy Treatment Patient Details Name: Dylan [name] MRN: [id number] DOB: 07/14/1965 Today's Date: 06/03/2017    History of present illness Tania "[name]" Rizzi is a 52yo black male who comes to [facility] on 3/30 with ABD pain and hypoglycemia, admitted with cervical abcess v hematoma s/p C five-seven surgery on . 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 He DC to SNF ~3WA with motor weakness C7 down, full sensation intact. He reports graual worsening of manipulative use with smartphone over those three weaks.  underwent right-sided tunneled catheter placement for hemodialysis and right brachiocephalic AV fistula on 06/01/17   OT comments  Pt seen for brief session this am. Pt disoriented - unable to state month. Difficulty with sustaining attention, following commands  and keeping eyes open. Pt stated "I'm giving up", then said "I'm having a bad day". Nursing alerted to change in cognition and increased lethargy. Pt continues to decline functionally. Recommend reconsult with Palliative Medicine. Will follow up tomorrow.   Follow Up Recommendations  SNF;Supervision/Assistance - 24 hour    Equipment Recommendations  Wheelchair (measurements OT);Wheelchair cushion (measurements OT)    Recommendations for Other Services      Precautions / Restrictions Precautions Precautions: Fall;Cervical;Other (comment) Precaution Comments: watch BP; HD catheter RUE Required Braces or Orthoses: Other Brace/Splint Other Brace/Splint: B resting hand splints for night use only; B WHO to use PRN during the day       Mobility Bed Mobility                  Transfers                      Balance                                           ADL either performed or assessed with clinical judgement   ADL                                                Vision       Perception     Praxis      Cognition Arousal/Alertness: Lethargic Behavior During Therapy: Flat affect Overall Cognitive Status: Impaired/Different from baseline Area of Impairment: Orientation;Attention;Memory;Following commands;Safety/judgement;Awareness;Problem solving                 Orientation Level: Disoriented to;Time(unable to state month) Current Attention Level: Focused Memory: Decreased recall of precautions;Decreased short-term memory Following Commands: Follows one step commands inconsistently Safety/Judgement: Decreased awareness of safety;Decreased awareness of deficits Awareness: Intellectual Problem Solving: Slow processing;Decreased initiation;Difficulty sequencing;Requires verbal cues;Requires tactile cues          Exercises General Exercises - Upper Extremity Shoulder Flexion: AAROM;Both;15 reps;Supine Shoulder Extension: AAROM;Both;15 reps;Supine Shoulder ABduction: Both;10 reps Shoulder ADduction: AAROM;Both;15 reps;Supine Shoulder Horizontal ABduction: Both;10 reps Shoulder Horizontal ADduction: Both;10 reps Elbow Flexion: Both;Strengthening;Supine;5 reps Elbow Extension: AAROM;Both;Supine;5 reps Wrist Flexion: AROM;15 reps;Supine;Both Wrist Extension: Left;PROM;10 reps;Right;Strengthening;15 reps Digit Composite Flexion: PROM;Both;10 reps;Supine   Shoulder Instructions       General Comments  Splints on improperly. REmoved. Staff educated. Referred to instruction sheet posted on pt door regarding  proper positioning and use.     Pertinent Vitals/ Pain       Pain Assessment: Faces Faces Pain Scale: Hurts little more Pain Location: L shoulder at IV site with horizontal adduction and R UE with ROM Pain Descriptors / Indicators: Grimacing;Guarding Pain Intervention(s): Limited activity within patient's tolerance  Home Living                                           Prior Functioning/Environment              Frequency  Min 3X/week        Progress Toward Goals  OT Goals(current goals can now be found in the care plan section)  Progress towards OT goals: Not progressing toward goals - comment;OT to reassess next treatment(decline in medical status)  Acute Rehab OT Goals Patient Stated Goal: none stated OT Goal Formulation: With patient Time For Goal Achievement: 06/10/17 Potential to Achieve Goals: Fair ADL Goals Pt Will Perform Eating: with mod assist;with adaptive utensils;sitting Pt Will Perform Grooming: with mod assist;sitting;with adaptive equipment Pt/caregiver will Perform Home Exercise Program: Increased ROM;Increased strength;Both right and left upper extremity;With minimal assist Additional ADL Goal #1: Pt will demonstrate ability to self-direct personal care during ADL/mobility completion. Additional ADL Goal #2: Pt will independently direct caregiver on donning/doffing WHO and purpose of splints. Additional ADL Goal #3: Pt will tolerate B resting hand splints to improve functional positioning of hands without complications  Plan Discharge plan remains appropriate    Co-evaluation                 AM-PAC PT "6 Clicks" Daily Activity     Outcome Measure   Help from another person eating meals?: Total Help from another person taking care of personal grooming?: Total Help from another person toileting, which includes using toliet, bedpan, or urinal?: Total Help from another person bathing (including washing, rinsing, drying)?: Total Help from another person to put on and taking off regular upper body clothing?: Total Help from another person to put on and taking off regular lower body clothing?: Total 6 Click Score: 6    End of Session Equipment Utilized During Treatment: Oxygen  OT Visit Diagnosis: Muscle weakness (generalized) (M62.81);Other symptoms and signs involving the nervous system (R29.898)    Activity Tolerance Patient limited by lethargy   Patient Left in bed;with call bell/phone within reach   Nurse Communication Mobility status;Other (comment)(concerns for lethargy)        Time: 1145-1200 OT Time Calculation (min): 15 min  Charges: OT General Charges $OT Visit: 1 Visit OT Treatments $Therapeutic Activity: 8-22 mins  [facility], OT/L  [phone] 06/03/2017   Myrl Lazarus,HILLARY 06/03/2017, 4:02 PM

## 2017-06-03 NOTE — Progress Notes (Signed)
Received page to see this patient.  Initially he was sleeping.  Went back later and he was awake but not really interested in talking at the time.  Offered to pray but he declined.  It was his birthday and he had balloons all around-shared he has family and friends nearby.   Phebe Colla, Chaplain    06/03/17 1500  Clinical Encounter Type  Visited With Patient  Visit Type Initial;Spiritual support  Referral From Nurse  Consult/Referral To Chaplain  Spiritual Encounters  Spiritual Needs Other (Comment) (patient did not express any needs even though asked)  Stress Factors  Patient Stress Factors None identified  Family Stress Factors None identified

## 2017-06-03 NOTE — Progress Notes (Signed)
Subjective: Interval History: has no complaint.  Objective: Vital signs in last 24 hours: Temp:  [97.5 F (36.4 C)-98.3 F (36.8 C)] 98.3 F (36.8 C) (05/02 0743) Pulse Rate:  [64-145] 74 (05/02 0743) Resp:  [16-22] 16 (05/02 0743) BP: (93-165)/(55-88) 165/84 (05/02 0743) SpO2:  [96 %-100 %] 100 % (05/02 0743) Weight change:   Intake/Output from previous day: 05/01 0701 - 05/02 0700 In: 480 [P.O.:480] Out: 2702 [Urine:1500] Intake/Output this shift: No intake/output data recorded.  General appearance: cooperative, no distress and slowed mentation Resp: diminished breath sounds bilaterally Chest wall: RIJ cath Cardio: S1, S2 normal and systolic murmur: systolic ejection 2/6, crescendo at 2nd left intercostal space GI: obese, pos bs, cannot feel bladder this am Extremities: braces on hands and feet, boots on feet  Lab Results: Recent Labs    06/02/17 0642 06/03/17 0500  WBC 9.5 12.0*  HGB 7.9* 7.9*  HCT 23.5* 23.7*  PLT 38* 37*   BMET:  Recent Labs    06/02/17 0642 06/03/17 0500  NA 139 138  K 4.9 3.6  CL 103 100*  CO2 23 28  GLUCOSE 190* 114*  BUN 61* 24*  CREATININE 4.41* 2.22*  CALCIUM 6.6* 6.5*   No results for input(s): PTH in the last 72 hours. Iron Studies: No results for input(s): IRON, TIBC, TRANSFERRIN, FERRITIN in the last 72 hours.  Studies/Results: No results found.  I have reviewed the patient's current medications.  Assessment/Plan: 1 AKI/CKD large amt of urine in bladder yest, will try to follow and see if any function. Hold of HD on daily basis 2 Anemia esa 3 Quad 4 BOO need to make sure adquate drainage 5 Decub cannot sit in chair per wound 6 CAD 7 SVT last pm per primary P Bladder scans, Hd as need, cont esa, wound care    LOS: 33 days   Dylan [name] 06/03/2017,8:05 AM

## 2017-06-03 NOTE — Progress Notes (Signed)
CSW continuing to follow for discharge needs.  Bertram Haddix, LCSW Clinical Social Worker [phone]  

## 2017-06-04 ENCOUNTER — Inpatient Hospital Stay (HOSPITAL_COMMUNITY): Payer: Medicaid Other

## 2017-06-04 LAB — RENAL FUNCTION PANEL
ALBUMIN: 1.8 g/dL — AB (ref 3.5–5.0)
Anion gap: 15 (ref 5–15)
BUN: 33 mg/dL — AB (ref 6–20)
CHLORIDE: 99 mmol/L — AB (ref 101–111)
CO2: 23 mmol/L (ref 22–32)
CREATININE: 2.88 mg/dL — AB (ref 0.61–1.24)
Calcium: 6.6 mg/dL — ABNORMAL LOW (ref 8.9–10.3)
GFR calc Af Amer: 28 mL/min — ABNORMAL LOW (ref >60)
GFR, EST NON AFRICAN AMERICAN: 24 mL/min — AB (ref >60)
Glucose, Bld: 187 mg/dL — ABNORMAL HIGH (ref 65–99)
Phosphorus: 3.9 mg/dL (ref 2.5–4.6)
Potassium: 4.3 mmol/L (ref 3.5–5.1)
SODIUM: 137 mmol/L (ref 135–145)

## 2017-06-04 LAB — GLUCOSE, CAPILLARY
GLUCOSE-CAPILLARY: 163 mg/dL — AB (ref 65–99)
Glucose-Capillary: 177 mg/dL — ABNORMAL HIGH (ref 65–99)
Glucose-Capillary: 192 mg/dL — ABNORMAL HIGH (ref 65–99)
Glucose-Capillary: 197 mg/dL — ABNORMAL HIGH (ref 65–99)

## 2017-06-04 LAB — TYPE AND SCREEN
ABO/RH(D): B POS
Antibody Screen: NEGATIVE
UNIT DIVISION: 0
Unit division: 0

## 2017-06-04 LAB — BPAM RBC
BLOOD PRODUCT EXPIRATION DATE: 201905212359
BLOOD PRODUCT EXPIRATION DATE: 201905232359
ISSUE DATE / TIME: 201904301631
Unit Type and Rh: 7300
Unit Type and Rh: 7300

## 2017-06-04 MED ORDER — CIPROFLOXACIN HCL 500 MG PO TABS
500.0000 mg | ORAL_TABLET | Freq: Every day | ORAL | Status: DC
  Administered 2017-06-04 – 2017-06-09 (×6): 500 mg via ORAL
  Filled 2017-06-04 (×6): qty 1

## 2017-06-04 MED ORDER — HYDRALAZINE HCL 20 MG/ML IJ SOLN
10.0000 mg | Freq: Four times a day (QID) | INTRAMUSCULAR | Status: DC | PRN
  Administered 2017-06-04 – 2017-06-06 (×2): 10 mg via INTRAVENOUS
  Filled 2017-06-04 (×2): qty 1

## 2017-06-04 MED ORDER — SODIUM CHLORIDE 0.9 % IV SOLN
125.0000 mg | Freq: Every day | INTRAVENOUS | Status: AC
  Administered 2017-06-04 – 2017-06-07 (×4): 125 mg via INTRAVENOUS
  Filled 2017-06-04 (×7): qty 10

## 2017-06-04 NOTE — Progress Notes (Signed)
Occupational Therapy Treatment Patient Details Name: Dylan [name] MRN: [id number] DOB: 14-May-1965 Today's Date: 06/04/2017    History of present illness Dylan [name] is a 52yo black male who comes to [facility] on 3/30 with ABD pain and hypoglycemia, admitted with cervical abcess v hematoma s/p C five-seven surgery on . 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 He DC to SNF ~3WA with motor weakness C7 down, full sensation intact. He reports graual worsening of manipulative use with smartphone over those three weaks.  underwent right-sided tunneled catheter placement for hemodialysis and right brachiocephalic AV fistula on 06/01/17   OT comments  Vital Lift bed used today to begin standing. Once secured, pt able to tolerate standing at 45 degrees with VSS, After return to supine, pt moved into chair position. After properly positioning pt and RUE, pt able to use tenodesis grasp to feed self "cheese puffs". Pt excited about ability to do something for himself.  Encourage staff to place pt in chair position BID with SCD intact and working to help sustain BP Pt needs to wear B wrist splints @ 2 hr intervals during the day and B resting hand splints when sleeping at night.  Recommend use of B Prevalon boots at all times while in bed. Encourage daily BUE PROM by staff to help decrease risk of contracture formation.     Follow Up Recommendations  SNF;Supervision/Assistance - 24 hour    Equipment Recommendations  Wheelchair (measurements OT);Wheelchair cushion (measurements OT)    Recommendations for Other Services      Precautions / Restrictions Precautions Precautions: Fall;Cervical;Other (comment) Precaution Comments: watch BP; HD catheter RUE(multiple wounds on sacrum/buttocks) Required Braces or Orthoses: Other Brace/Splint Other  Brace/Splint: B resting hand splints for night use only; B WHO to use PRN during the day       Mobility Bed Mobility Overal bed mobility: Needs Assistance             General bed mobility comments: total A +2. Moved into 45 degrees of standing. VSS  Transfers                      Balance Overall balance assessment: Needs assistance   Sitting balance-Leahy Scale: Zero                                     ADL either performed or assessed with clinical judgement   ADL Overall ADL's : Needs assistance/impaired Eating/Feeding: Moderate assistance;With adaptive utensils                                     General ADL Comments: Vital Lift bed used to increase mobility and standing tolerance. After standing segment, focused on self feeding using tenodesis grasp. Pt able to feed self cheese puffs after proper set up.      Vision       Perception     Praxis      Cognition Arousal/Alertness: Awake/alert Behavior During Therapy: WFL for tasks assessed/performed Overall Cognitive Status: Impaired/Different from baseline Area of Impairment: Orientation;Awareness                 Orientation Level: Disoriented to;Time Current Attention Level: Selective Memory:  Decreased short-term memory Following Commands: Follows one step commands consistently   Awareness: Emergent Problem Solving: Slow processing General Comments: improved cognition from 06/03/17        Exercises Exercises: General Upper Extremity;General Lower Extremity General Exercises - Upper Extremity Shoulder Flexion: AAROM;Both;15 reps;Supine Shoulder Extension: AAROM;Both;15 reps;Supine Shoulder ABduction: Both;10 reps Shoulder ADduction: AAROM;Both;15 reps;Supine Shoulder Horizontal ABduction: Both;10 reps Shoulder Horizontal ADduction: Both;10 reps Elbow Flexion: Both;Strengthening;Supine;5 reps Elbow Extension: AAROM;Both;Supine;5 reps Wrist Flexion: AROM;15  reps;Supine;Both Wrist Extension: Left;PROM;10 reps;Right;Strengthening;15 reps Digit Composite Flexion: PROM;Both;10 reps;Supine Composite Extension: PROM;Both;10 reps;Supine   Shoulder Instructions       General Comments      Pertinent Vitals/ Pain       Pain Assessment: Faces Faces Pain Scale: Hurts little more Pain Location: with BUE P/AAROM, especially L elbow Pain Descriptors / Indicators: Grimacing;Guarding Pain Intervention(s): Limited activity within patient's tolerance;Repositioned  Home Living                                          Prior Functioning/Environment              Frequency  Min 3X/week        Progress Toward Goals  OT Goals(current goals can now be found in the care plan section)  Progress towards OT goals: Progressing toward goals  Acute Rehab OT Goals Patient Stated Goal: to get better OT Goal Formulation: With patient Time For Goal Achievement: 06/10/17 Potential to Achieve Goals: Fair ADL Goals Pt Will Perform Eating: with mod assist;with adaptive utensils;sitting Pt Will Perform Grooming: with mod assist;sitting;with adaptive equipment Pt/caregiver will Perform Home Exercise Program: Increased ROM;Increased strength;Both right and left upper extremity;With minimal assist Additional ADL Goal #1: Pt will demonstrate ability to self-direct personal care during ADL/mobility completion. Additional ADL Goal #2: Pt will independently direct caregiver on donning/doffing WHO and purpose of splints. Additional ADL Goal #3: Pt will tolerate B resting hand splints to improve functional positioning of hands without complications  Plan Discharge plan remains appropriate    Co-evaluation    PT/OT/SLP Co-Evaluation/Treatment: Yes Reason for Co-Treatment: Complexity of the patient's impairments (multi-system involvement);For patient/therapist safety   OT goals addressed during session: ADL's and self-care;Strengthening/ROM       AM-PAC PT "6 Clicks" Daily Activity     Outcome Measure   Help from another person eating meals?: A Lot Help from another person taking care of personal grooming?: A Lot Help from another person toileting, which includes using toliet, bedpan, or urinal?: Total Help from another person bathing (including washing, rinsing, drying)?: Total Help from another person to put on and taking off regular upper body clothing?: Total Help from another person to put on and taking off regular lower body clothing?: Total 6 Click Score: 8    End of Session Equipment Utilized During Treatment: Oxygen  OT Visit Diagnosis: Muscle weakness (generalized) (M62.81);Other symptoms and signs involving the nervous system (R29.898)   Activity Tolerance Patient tolerated treatment well   Patient Left in bed;with call bell/phone within reach;with SCD's reapplied   Nurse Communication Mobility status;Other (comment)(use of Tilt Bed)        Time: 1410-1500 OT Time Calculation (min): 50 min  Charges: OT General Charges $OT Visit: 1 Visit OT Treatments $Self Care/Home Management : 23-37 mins  [facility], OT/L  [phone] 06/04/2017   Dylan [name],Dylan [name] 06/04/2017, 3:47 PM

## 2017-06-04 NOTE — Progress Notes (Signed)
PROGRESS NOTE  Dylan [name] ZPS:[id number] DOB: 1966/01/17 DOA: 04/14/2017 PCP: Patient, No Pcp Per   LOS: 34 days   Brief Narrative / Interim history: Patient is a 52 year old male with past medical history significant for recent quadriplegia, multiple sclerosis, decompression of the spinal abscess, spinal stenosis who has been  admitted for the management of acute kidney injury.  He was recently admitted to neurosurgery on 04/12/2017 and had surgery(corpectomy/decompression surgery) on 03/25/17 for cord compression due to spinal stenosis at C6 ,C5 and C7.  Postoperative course was completed with atrial fibrillation, started on Eliquis but discontinued due to spinal hematoma. Also epidural clot versus abscess was suspected on his follow up  C-spine MRI. Because of the high risk patient decided not to repeat exploratory surgery.  He was also started on IV antibiotics due to complicated UTI ,possible cervical abscess and was discharged to skilled nursing facility on 04/09/2017.   His Foley catheter was not draining at the skilled nursing facility so he was sent to the emergency department.  Found to have acute kidney injury. Urology was also consulted, flushed his Foley with removal of foreign body yeilding 2.5 L of urine. Currently patient has been managed for acute kidney injury thought to be secondary to possible vancomycin related.  Nephrology following.  Patient hospital course is also complicated with sepsis, Candida glabrata fungal anemia, progressive oliguric renal failure, hyperkalemia , thrombocytopenia. Patient underwent right-sided tunneled catheter placement for hemodialysis and right brachiocephalic AV fistula on 06/01/17. His disposition will be challenging .Currently he may not be able to sit for dialysis due to ulcers on his buttocks and quadriplegia.  He might need SNF with dialysis facility out of state.  Social worker following.    Assessment & Plan: Principal Problem:   AKI  (acute kidney injury) (HCC) Active Problems:   Uncontrolled diabetes mellitus type 2 without complications (HCC)   Hypertension   Morbid obesity (HCC)   Paroxysmal atrial fibrillation (HCC)   Multiple sclerosis exacerbation (HCC)   Pressure injury of skin   Hyperkalemia   Hematoma   Insomnia   Adjustment reaction with anxiety and depression   Quadriplegia (HCC)   History of fusion of cervical spine   Postoperative wound infection   Coronary artery disease   Atherosclerotic peripheral vascular disease (HCC)   MDD (major depressive disorder), single episode, moderate (HCC)   Candidemia (HCC)   Goals of care, counseling/discussion   Palliative care by specialist   Acute lower UTI   Acute renal failure: Thought to be secondary to obstructive uropathy versus vancomycin-induced renal injury.  Nephrology is following.  He has been started on dialysis since 05/07/2017. Permanent  dialysis catheter placed on 05/16/2017.  There is also concern that patient might need stretcher dialysis which would be possible only out of state. S/P right brachiocephalic AV fistula per vascular surgeon -Nephrology following, will wait to see if he needs further dialysis  Acute encephalopathy -patient slightly more sleepy yesterday, could not tell the year, new. CT scan negative, will obtain MRI brain today. No contrast -also with a UTI. Treated based on sensitivities  Severe thrombocytopenia: Thought to be secondary to drug related.  Anidulafungin, daptomycin and cefepime stopped.  Protonix also stopped.  HIT negative.  Patient does not receive heparin with dialysis.  Hematology was also consulted .He has received several units of platelets transfusion. Hematology feels that this is not likely immune medicine thrombocytopenia given patient response to transfusion.  -Dr. Clelia Croft following intermittently -Platelets overall stable over the  last couple of days, recheck in the morning  GI bleed/acute blood loss  anemia/acute on chronic anemia: Protonix stopped due to thrombocytopenia.  -Status post 2 units of PRBC few days ago.  No further bleeding noted. -GI was on board due to her large episode of hematochezia, self resolved.  Recommending continue close monitoring.  Recommended to reconsult if rebleeding.  No new episodes of hematochezia . -GI reluctant to do any kind of procedures unless his platelets are more than 50,000.  Elevated troponin: Mild.  Did not trend up.  Patient denies any chest pain.  Likely supply demand ischemia  Quadriplegia/cervical abscess versus cervical hematoma/infected cervical hematoma: Patient completed 6 weeks of antibiotics including 4 weeks of vancomycin and cefepime followed by 2 weeks of daptomycin and cefepime.  Course completed on 05/16/2017. Neurosurgery was following before.  ID was following,signed off.  Neurogenic bladder: Patient was discharged with Foley catheter on 04/09/2017 in the context of new quadriplegia. Foley catheter exhanged on admission. Foley was taken out during this admission but had to be put back again due to urinary retention.  Continue Foley catheter .  UTI: Urine culture growing Ecoli as well as Pseudomonas. Pseudomonas is resistant to Cefepime. Change to Cipro today, plan for total of 5 days.  Candidemia: Patient had 10 days of  Anidulafungin which has been discontinued due to thrombocytopenia. Ophthalmology also consulted, no evidence of intraocular fungal infection.  Echocardiogram did not show any vegetation.  Diabetes type 2: Continue Lantus, sliding scale insulin.  We will continue monitor blood sugars. CBGs 160s today   Paroxysmal A. fib: No anticoagulation given his current thrombocytopenia.  Continue amiodarone, metoprolol and Cardizem. -sinus on telemetry   Multiple sclerosis: Recently diagnosed on previous admission and he was given 5 days of high-dose steroids with improvement.  Neurology was consulted at that time.   Recommended to follow as an outpatient.   Adjustment disorder/depression: Continue mirtazapine.  Psychiatry was following.  Recommended trazodone.  Hyponatremia/hypokalemia: Currently stable  Unstageable pressure injury on bilateral buttocks: Continue supportive care.  Continue wound care.  His pressure injury on bilateral buttocks is making him unable to sit for dialysis.  Nocturnal hypoxia: No history of OSA or the use of CPAP.  Continue supplemental oxygen as needed/CPAP at night.  Goals of care: Palliative care was consulted before.  Remains full code    DVT prophylaxis: SCD Code Status: Full code Family Communication: d/w extensive family members last night 5/2 Disposition Plan: Skilled nursing facility after improvement in thrombocytopenia , hematology evaluation, nephrology clearance and finding of skilled nursing facility with stretcher dialysis facility  Consultants:   Nephrology   Hematology   Neurology  Psychiatry   ID  Ophthalmology  GI  Procedures:  Echocardiogram, upper external vein mapping, HD  Antimicrobials:  Ciprofloxacin 5/2 >> Prior antibiotics include daptomycin, Eraxis, cefepime, vancomycin, Zosyn  Subjective: -sleepy, wakes up and appears appropriate however disoriented to time.   Objective: Vitals:   06/03/17 1650 06/03/17 2114 06/04/17 0433 06/04/17 1015  BP: (!) 155/74 (!) 144/84 (!) 175/88 (!) 164/90  Pulse: 79  70 72  Resp: 18 18  20   Temp: 98.4 F (36.9 C) 98.5 F (36.9 C) 97.7 F (36.5 C) 98.2 F (36.8 C)  TempSrc: Axillary Axillary Oral Oral  SpO2: 100% 100% 100% 100%  Weight:      Height:        Intake/Output Summary (Last 24 hours) at 06/04/2017 1157 Last data filed at 06/04/2017 1035 Gross per 24 hour  Intake 360 ml  Output 750 ml  Net -390 ml   Filed Weights   06/01/17 0500 06/02/17 0029  Weight: 122 kg (269 lb) 127 kg (280 lb)    Examination:  Constitutional: no distress Eyes: PERRL, no scleral  isterus Respiratory: CTA biL, no wheezing, shallow breath sounds Cardiovascular: RRR without MRG, no edema Abdomen: soft, NT, ND Skin: no rashes Neurologic: quad Psychiatric: AxOx0 person and place, not time   Data Reviewed: I have independently reviewed following labs and imaging studies   CBC: Recent Labs  Lab 05/30/17 0811 05/13/2017 0237 06/01/17 0923 06/02/17 0642 06/03/17 0500  WBC 7.9 6.9 9.8 9.5 12.0*  NEUTROABS 6.5 5.5 8.8* 8.8*  --   HGB 8.1* 7.9* 7.4* 7.9* 7.9*  HCT 24.1* 24.2* 21.9* 23.5* 23.7*  MCV 88.0 87.1 88.0 86.7 87.1  PLT 58* 43* 49* 38* 37*   Basic Metabolic Panel: Recent Labs  Lab 05/29/17 0316 05/30/17 0811 05/27/2017 0237 06/01/17 0923 06/02/17 0642 06/03/17 0500 06/04/17 0825  NA 138 136 138 137 139 138 137  K 4.8 4.4 4.3 4.9 4.9 3.6 4.3  CL 99* 100* 101 102 103 100* 99*  CO2 27 28 27  21* 23 28 23   GLUCOSE 216* 84 113* 207* 190* 114* 187*  BUN 65* 38* 44* 50* 61* 24* 33*  CREATININE 4.72* 3.13* 3.54* 4.09* 4.41* 2.22* 2.88*  CALCIUM 6.8* 6.8* 6.7* 6.7* 6.6* 6.5* 6.6*  PHOS 3.8 3.4  --  5.1*  --   --  3.9   GFR: Estimated Creatinine Clearance: 44.7 mL/min (A) (by C-G formula based on SCr of 2.88 mg/dL (H)). Liver Function Tests: Recent Labs  Lab 05/29/17 0316 05/30/17 0811 06/01/17 0923 06/04/17 0825  ALBUMIN 2.0* 2.0* 1.8* 1.8*   No results for input(s): LIPASE, AMYLASE in the last 168 hours. No results for input(s): AMMONIA in the last 168 hours. Coagulation Profile: Recent Labs  Lab 05/11/2017 0237  INR 1.31   Cardiac Enzymes: No results for input(s): CKTOTAL, CKMB, CKMBINDEX, TROPONINI in the last 168 hours. BNP (last 3 results) No results for input(s): PROBNP in the last 8760 hours. HbA1C: No results for input(s): HGBA1C in the last 72 hours. CBG: Recent Labs  Lab 06/03/17 0640 06/03/17 1156 06/03/17 1704 06/03/17 2105 06/04/17 0708  GLUCAP 126* 154* 146* 167* 163*   Lipid Profile: No results for input(s): CHOL,  HDL, LDLCALC, TRIG, CHOLHDL, LDLDIRECT in the last 72 hours. Thyroid Function Tests: No results for input(s): TSH, T4TOTAL, FREET4, T3FREE, THYROIDAB in the last 72 hours. Anemia Panel: No results for input(s): VITAMINB12, FOLATE, FERRITIN, TIBC, IRON, RETICCTPCT in the last 72 hours. Urine analysis:    Component Value Date/Time   COLORURINE YELLOW 05/29/2017 0340   APPEARANCEUR CLOUDY (A) 05/29/2017 0340   LABSPEC 1.009 05/29/2017 0340   PHURINE 6.0 05/29/2017 0340   GLUCOSEU NEGATIVE 05/29/2017 0340   HGBUR MODERATE (A) 05/29/2017 0340   BILIRUBINUR NEGATIVE 05/29/2017 0340   KETONESUR NEGATIVE 05/29/2017 0340   PROTEINUR 100 (A) 05/29/2017 0340   NITRITE NEGATIVE 05/29/2017 0340   LEUKOCYTESUR LARGE (A) 05/29/2017 0340   Sepsis Labs: Invalid input(s): PROCALCITONIN, LACTICIDVEN  Recent Results (from the past 240 hour(s))  Culture, Urine     Status: Abnormal   Collection Time: 05/29/17  3:38 AM  Result Value Ref Range Status   Specimen Description URINE, CATHETERIZED  Final   Special Requests   Final    NONE Performed at [facility], [address], [place], [place] [address]  Culture (A)  Final    >=100,000 COLONIES/mL ESCHERICHIA COLI >=100,000 COLONIES/mL PSEUDOMONAS AERUGINOSA 20,000 COLONIES/mL STAPHYLOCOCCUS AUREUS    Report Status 06/03/2017 FINAL  Final   Organism ID, Bacteria ESCHERICHIA COLI (A)  Final   Organism ID, Bacteria PSEUDOMONAS AERUGINOSA (A)  Final   Organism ID, Bacteria STAPHYLOCOCCUS AUREUS (A)  Final      Susceptibility   Escherichia coli - MIC*    AMPICILLIN >=32 RESISTANT Resistant     CEFAZOLIN <=4 SENSITIVE Sensitive     CEFTRIAXONE <=1 SENSITIVE Sensitive     CIPROFLOXACIN <=0.25 SENSITIVE Sensitive     GENTAMICIN <=1 SENSITIVE Sensitive     IMIPENEM <=0.25 SENSITIVE Sensitive     NITROFURANTOIN <=16 SENSITIVE Sensitive     TRIMETH/SULFA <=20 SENSITIVE Sensitive     AMPICILLIN/SULBACTAM 4 SENSITIVE Sensitive     PIP/TAZO <=4  SENSITIVE Sensitive     * >=100,000 COLONIES/mL ESCHERICHIA COLI   Pseudomonas aeruginosa - MIC*    CEFTAZIDIME >=64 RESISTANT Resistant     CIPROFLOXACIN <=0.25 SENSITIVE Sensitive     GENTAMICIN 4 SENSITIVE Sensitive     IMIPENEM 2 SENSITIVE Sensitive     CEFEPIME 32 RESISTANT Resistant     * >=100,000 COLONIES/mL PSEUDOMONAS AERUGINOSA   Staphylococcus aureus - MIC*    CIPROFLOXACIN <=0.5 SENSITIVE Sensitive     GENTAMICIN <=0.5 SENSITIVE Sensitive     NITROFURANTOIN <=16 SENSITIVE Sensitive     OXACILLIN <=0.25 SENSITIVE Sensitive     TETRACYCLINE <=1 SENSITIVE Sensitive     VANCOMYCIN 1 SENSITIVE Sensitive     TRIMETH/SULFA <=10 SENSITIVE Sensitive     CLINDAMYCIN <=0.25 SENSITIVE Sensitive     RIFAMPIN <=0.5 SENSITIVE Sensitive     Inducible Clindamycin NEGATIVE Sensitive     * 20,000 COLONIES/mL STAPHYLOCOCCUS AUREUS      Radiology Studies: Ct Head Wo Contrast  Result Date: 06/04/2017 CLINICAL DATA:  Encephalopathy EXAM: CT HEAD WITHOUT CONTRAST TECHNIQUE: Contiguous axial images were obtained from the base of the skull through the vertex without intravenous contrast. COMPARISON:  03/31/2016 head CT Brain MRI 03/23/2017 FINDINGS: Brain: No mass lesion, intraparenchymal hemorrhage or extra-axial collection. No evidence of acute cortical infarct. Multiple hypoattenuating lesions of the white matter are unchanged. Vascular: No hyperdense vessel or unexpected vascular calcification. Skull: Normal visualized skull base, calvarium and extracranial soft tissues. Sinuses/Orbits: No sinus fluid levels or advanced mucosal thickening. No mastoid effusion. Normal orbits. IMPRESSION: Unchanged findings of chronic demyelination without acute intracranial abnormality. Electronically Signed   By: Deatra Robinson M.D.   On: 06/04/2017 01:32     Scheduled Meds: . amiodarone  200 mg Oral Daily  . ciprofloxacin  500 mg Oral Q breakfast  . collagenase   Topical Daily  . darbepoetin (ARANESP)  injection - DIALYSIS  200 mcg Intravenous Q Wed-HD  . dexamethasone  3 mg Oral Q8H  . diltiazem  120 mg Oral QHS  . feeding supplement (ENSURE ENLIVE)  237 mL Oral BID BM  . guaiFENesin  600 mg Oral BID  . insulin aspart  0-5 Units Subcutaneous QHS  . insulin aspart  0-9 Units Subcutaneous TID WC  . insulin aspart  2 Units Subcutaneous TID WC  . insulin glargine  12 Units Subcutaneous Daily  . metoprolol tartrate  12.5 mg Oral BID  . mirtazapine  30 mg Oral QHS  . multivitamin  1 tablet Oral QHS   Continuous Infusions: . sodium chloride    . sodium chloride    . sodium chloride    .  ferric gluconate (FERRLECIT/NULECIT) IV      Pamella Pert, MD, PhD [facility] Pager [phone]  If 7PM-7AM, please contact night-coverage [email or web address] Password [email or web address] 06/04/2017, 11:57 AM

## 2017-06-04 NOTE — Progress Notes (Signed)
Physical Therapy Treatment Patient Details Name: Dylan [name] MRN: [id number] DOB: [birth year] Today's Date: 06/04/2017    History of Present Illness Dylan [name] is a 52yo black male who comes to [facility] on 3/30 with ABD pain and hypoglycemia, admitted with cervical abcess v hematoma s/p C five-seven surgery on . 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 He DC to SNF ~3WA with motor weakness C7 down, full sensation intact. He reports graual worsening of manipulative use with smartphone over those three weaks.  underwent right-sided tunneled catheter placement for hemodialysis and right brachiocephalic AV fistula on 06/01/17    PT Comments    Patient tolerated session well today with first trial using Vital Lift bed. Pt without c/o dizziness and VSS (see general comments below) at up to 45 degrees tilt. Pt appeared to be in better spirits today and more alert. Patient comfortable in chair position with Prevalon boots and bilat wrist cuffs end of session. Continue to progress as tolerated.   Follow Up Recommendations  SNF;Supervision/Assistance - 24 hour     Equipment Recommendations  Wheelchair (measurements PT);Wheelchair cushion (measurements PT)    Recommendations for Other Services       Precautions / Restrictions Precautions Precautions: Fall;Cervical;Other (comment) Precaution Comments: watch BP; HD catheter RUE(multiple wounds on sacrum/buttocks) Required Braces or Orthoses: Other Brace/Splint Other Brace/Splint: B resting hand splints for night use only; B WHO to use PRN during the day    Mobility  Bed Mobility Overal bed mobility: Needs Assistance             General bed mobility comments: total A +2. Moved tinto 45 degrees of standing in vital go bed  Transfers                    Ambulation/Gait                  Stairs             Wheelchair Mobility    Modified Rankin (Stroke Patients Only)       Balance Overall balance assessment: Needs assistance   Sitting balance-Leahy Scale: Zero                                      Cognition Arousal/Alertness: Awake/alert Behavior During Therapy: WFL for tasks assessed/performed Overall Cognitive Status: Impaired/Different from baseline Area of Impairment: Orientation;Awareness                 Orientation Level: Disoriented to;Time Current Attention Level: Selective Memory: Decreased short-term memory Following Commands: Follows one step commands consistently   Awareness: Emergent Problem Solving: Slow processing General Comments: improved cognition from 06/03/17      Exercises General Exercises - Upper Extremity Shoulder Flexion: AAROM;Both;15 reps;Supine Shoulder Extension: AAROM;Both;15 reps;Supine Shoulder ABduction: Both;10 reps Shoulder ADduction: AAROM;Both;15 reps;Supine Shoulder Horizontal ABduction: Both;10 reps Shoulder Horizontal ADduction: Both;10 reps Elbow Flexion: Both;Strengthening;Supine;5 reps Elbow Extension: AAROM;Both;Supine;5 reps Wrist Flexion: AROM;15 reps;Supine;Both Wrist Extension: Left;PROM;10 reps;Right;Strengthening;15 reps Digit Composite Flexion: PROM;Both;10 reps;Supine Composite Extension: PROM;Both;10 reps;Supine    General Comments General comments (skin integrity, edema, etc.): pt with BP of 165/77 supine, 170/115 at 30 degrees tilt, and 153/117 at 45 degree tilt      Pertinent Vitals/Pain Pain Assessment: Faces Faces Pain Scale: Hurts  little more Pain Location: with BUE P/AAROM, especially L elbow Pain Descriptors / Indicators: Grimacing;Guarding Pain Intervention(s): Limited activity within patient's tolerance;Monitored during session;Repositioned    Home Living                      Prior Function            PT Goals (current  goals can now be found in the care plan section) Acute Rehab PT Goals Patient Stated Goal: to get better Progress towards PT goals: Progressing toward goals    Frequency    Min 2X/week      PT Plan Current plan remains appropriate    Co-evaluation PT/OT/SLP Co-Evaluation/Treatment: Yes Reason for Co-Treatment: Complexity of the patient's impairments (multi-system involvement);For patient/therapist safety PT goals addressed during session: Mobility/safety with mobility;Strengthening/ROM OT goals addressed during session: ADL's and self-care;Strengthening/ROM      AM-PAC PT "6 Clicks" Daily Activity  Outcome Measure  Difficulty turning over in bed (including adjusting bedclothes, sheets and blankets)?: Unable Difficulty moving from lying on back to sitting on the side of the bed? : Unable Difficulty sitting down on and standing up from a chair with arms (e.g., wheelchair, bedside commode, etc,.)?: Unable Help needed moving to and from a bed to chair (including a wheelchair)?: Total Help needed walking in hospital room?: Total Help needed climbing 3-5 steps with a railing? : Total 6 Click Score: 6    End of Session Equipment Utilized During Treatment: Oxygen Activity Tolerance: Patient tolerated treatment well Patient left: in bed;with call bell/phone within reach;Other (comment)(pt in chair position in vital go bed) Nurse Communication: Mobility status PT Visit Diagnosis: Other symptoms and signs involving the nervous system (R29.898);Difficulty in walking, not elsewhere classified (R26.2);Muscle weakness (generalized) (M62.81)     Time: 1410-1447 PT Time Calculation (min) (ACUTE ONLY): 37 min  Charges:  $Therapeutic Activity: 8-22 mins                    G Codes:       Erline Levine, PTA Pager: [phone]     Carolynne Edouard 06/04/2017, 4:03 PM

## 2017-06-04 NOTE — Progress Notes (Signed)
Subjective: Interval History: has no complaint , but lethargic does not answer questions.  Objective: Vital signs in last 24 hours: Temp:  [97.7 F (36.5 C)-98.7 F (37.1 C)] 97.7 F (36.5 C) (05/03 0433) Pulse Rate:  [70-81] 70 (05/03 0433) Resp:  [18] 18 (05/02 2114) BP: (144-175)/(74-88) 175/88 (05/03 0433) SpO2:  [100 %] 100 % (05/03 0433) Weight change:   Intake/Output from previous day: 05/02 0701 - 05/03 0700 In: 360 [P.O.:360] Out: -  Intake/Output this shift: No intake/output data recorded.  General appearance: moderately obese, slowed mentation and as above, not coop Resp: diminished breath sounds bilaterally Chest wall: RIJ PC Cardio: S1, S2 normal and systolic murmur: systolic ejection 2/6, decrescendo at 2nd left intercostal space GI: soft, non-tender; bowel sounds normal; no masses,  no organomegaly Extremities: braces/boots on feet AVF Lab Results: Recent Labs    06/02/17 0642 06/03/17 0500  WBC 9.5 12.0*  HGB 7.9* 7.9*  HCT 23.5* 23.7*  PLT 38* 37*   BMET:  Recent Labs    06/02/17 0642 06/03/17 0500  NA 139 138  K 4.9 3.6  CL 103 100*  CO2 23 28  GLUCOSE 190* 114*  BUN 61* 24*  CREATININE 4.41* 2.22*  CALCIUM 6.6* 6.5*   No results for input(s): PTH in the last 72 hours. Iron Studies: No results for input(s): IRON, TIBC, TRANSFERRIN, FERRITIN in the last 72 hours.  Studies/Results: Ct Head Wo Contrast  Result Date: 06/04/2017 CLINICAL DATA:  Encephalopathy EXAM: CT HEAD WITHOUT CONTRAST TECHNIQUE: Contiguous axial images were obtained from the base of the skull through the vertex without intravenous contrast. COMPARISON:  03/31/2016 head CT Brain MRI 03/23/2017 FINDINGS: Brain: No mass lesion, intraparenchymal hemorrhage or extra-axial collection. No evidence of acute cortical infarct. Multiple hypoattenuating lesions of the white matter are unchanged. Vascular: No hyperdense vessel or unexpected vascular calcification. Skull: Normal  visualized skull base, calvarium and extracranial soft tissues. Sinuses/Orbits: No sinus fluid levels or advanced mucosal thickening. No mastoid effusion. Normal orbits. IMPRESSION: Unchanged findings of chronic demyelination without acute intracranial abnormality. Electronically Signed   By: Deatra Robinson M.D.   On: 06/04/2017 01:32    I have reviewed the patient's current medications.  Assessment/Plan: 1 CKD/AKI has 800 cc urine in bag but no output recorded, cannot feel bladder today. Will check chem 2 Anemia stable,  3 Quad 4 BOO need to monitor bladder 5 Obesity 6 Afib 7 confusion P Bladder scans, check chem and decide regarding HD    LOS: 34 days   Dylan [name] 06/04/2017,8:00 AM

## 2017-06-05 DIAGNOSIS — G9340 Encephalopathy, unspecified: Secondary | ICD-10-CM

## 2017-06-05 LAB — RENAL FUNCTION PANEL
ALBUMIN: 1.8 g/dL — AB (ref 3.5–5.0)
ANION GAP: 13 (ref 5–15)
BUN: 36 mg/dL — AB (ref 6–20)
CALCIUM: 6.9 mg/dL — AB (ref 8.9–10.3)
CO2: 26 mmol/L (ref 22–32)
CREATININE: 3.22 mg/dL — AB (ref 0.61–1.24)
Chloride: 99 mmol/L — ABNORMAL LOW (ref 101–111)
GFR calc Af Amer: 24 mL/min — ABNORMAL LOW (ref >60)
GFR calc non Af Amer: 21 mL/min — ABNORMAL LOW (ref >60)
GLUCOSE: 193 mg/dL — AB (ref 65–99)
PHOSPHORUS: 4.3 mg/dL (ref 2.5–4.6)
Potassium: 4.2 mmol/L (ref 3.5–5.1)
SODIUM: 138 mmol/L (ref 135–145)

## 2017-06-05 LAB — CBC
HEMATOCRIT: 24.6 % — AB (ref 39.0–52.0)
HEMOGLOBIN: 8.2 g/dL — AB (ref 13.0–17.0)
MCH: 29.3 pg (ref 26.0–34.0)
MCHC: 33.3 g/dL (ref 30.0–36.0)
MCV: 87.9 fL (ref 78.0–100.0)
Platelets: 46 10*3/uL — ABNORMAL LOW (ref 150–400)
RBC: 2.8 MIL/uL — AB (ref 4.22–5.81)
RDW: 15.7 % — ABNORMAL HIGH (ref 11.5–15.5)
WBC: 9.9 10*3/uL (ref 4.0–10.5)

## 2017-06-05 LAB — GLUCOSE, CAPILLARY
GLUCOSE-CAPILLARY: 278 mg/dL — AB (ref 65–99)
GLUCOSE-CAPILLARY: 336 mg/dL — AB (ref 65–99)
Glucose-Capillary: 176 mg/dL — ABNORMAL HIGH (ref 65–99)
Glucose-Capillary: 193 mg/dL — ABNORMAL HIGH (ref 65–99)

## 2017-06-05 LAB — HEPATIC FUNCTION PANEL
ALK PHOS: 115 U/L (ref 38–126)
ALT: 56 U/L (ref 17–63)
AST: 23 U/L (ref 15–41)
Albumin: 1.8 g/dL — ABNORMAL LOW (ref 3.5–5.0)
BILIRUBIN INDIRECT: 1 mg/dL — AB (ref 0.3–0.9)
BILIRUBIN TOTAL: 1.1 mg/dL (ref 0.3–1.2)
Bilirubin, Direct: 0.1 mg/dL (ref 0.1–0.5)
Total Protein: 5.3 g/dL — ABNORMAL LOW (ref 6.5–8.1)

## 2017-06-05 MED ORDER — LACTULOSE 10 GM/15ML PO SOLN: 30.0000 g | Freq: Two times a day (BID) | ORAL | Status: DC

## 2017-06-05 NOTE — Consult Note (Addendum)
Referring Physician: Caren Griffins, MD    Chief Complaint: Abdominal Pain and Hypoglycemia   HPI: Dylan [name] is an 52 y.o. male on extensive past medical history significant for recent quadriplegia, multiple sclerosis, decompression of the spinal abscess, spinal stenosis who was admitted to the hospital for acute kidney injury.  A little bit background of the history of this patient, he was admitted on 03/21/17  With progressive generalized weakness and was s/p corpectomy/decompression surgery on 03/25/2017 for cord compression due to spinal stenosis at C6 at C5 and C7.  Postop course was complicated by atrial fibrillation, he was started on Eliquis but discontinued due to a spinal hematoma, epidural clot versus hematoma.  Due to patient being such a high risk neurosurgery did not pursue repeat exploratory surgery.  He had complicated UTI, started on antibiotics, possible cervical abscess and was discharged to skilled nursing facility on 04/09/2017.  He was readmitted again by neurosurgery on 04/11/2017 for bleeding   and hematoma at surgical site, admitted observation and discharged on 1219.  Patient was readmitted on 04/14/2017 acute kidney injury likely secondary to occluded urinary catheter. e was evaluated by urology and a small foreign object was removed from the Foley catheter and patient immediately drained out 2.5 L.  Patient had worsening creatinine with BUN of 108 creatinine 5.68 potassium 6.4 at that time.  Patient has had a prolonged hospital stay complicated by kidney failure thought to be likely secondary to vancomycin toxicity, sepsis, Candida glabrata fungemia, thrombus cytopenia hypokalemia, oliguric renal failure.  At this time patient has had a right brachiocephalic AV fistula placed on 1/77/9390 and complicating ulcers on his buttocks.   Over the last several days patient was found to have acute encephalopathy, with more sleepiness and lethargy.  CT scan of the head was negative.  An  MRI of the brain which showed lesions that would reflect mild acute versus subacute active demyelination changes.  Neurology was consulted for further evaluation.  On assessment patient is awake alert oriented to self, is frustrated with answering questions as he may not be able to articulate answers.  He is quadriplegic with  noted atrophy in his upper extremities with strength emanating from his shoulders.  Past Medical History:  Diagnosis Date  . 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 Diabetes mellitus without complication ([place])   . Hypertension   . MS (congenital mitral stenosis) 2019  . Persistent atrial fibrillation with rapid ventricular response ([place]) 03/27/2017    Past Surgical History:  Procedure Laterality Date  . ANTERIOR CERVICAL DECOMP/DISCECTOMY FUSION N/A 03/25/2017   Procedure: ANTERIOR CERVICAL DECOMPRESSION/DISCECTOMY FUSION CERVICAL FIVE- CERVICAL SIX, CERVICAL SIX- CERVICAL SEVEN, CORPECTOMY;  Surgeon: Consuella Lose, MD;  Location: [facility];  Service: Neurosurgery;  Laterality: N/A;  . AV FISTULA PLACEMENT Right 05/13/2017   Procedure: ARTERIOVENOUS (AV) FISTULA CREATION RIGHT;  Surgeon: Angelia Mould, MD;  Location: [facility];  Service: Vascular;  Laterality: Right;  . INSERTION OF DIALYSIS CATHETER Right 05/28/2017   Procedure: INSERTION OF DIALYSIS CATHETER;  Surgeon: Angelia Mould, MD;  Location: [facility];  Service: Vascular;  Laterality: Right;  . IR FLUORO GUIDE CV LINE RIGHT  05/07/2017  . IR US GUIDE VASC ACCESS RIGHT  05/07/2017  . NOSE SURGERY     AS A CHILD    Family History  Problem Relation Age of Onset  . Prostate cancer Father    Social History:  reports that he has never smoked. He has never used smokeless tobacco. He  reports that he does not drink alcohol or use drugs.  Allergies: No Known Allergies  Medications:  . amiodarone  200 mg Oral Daily  . ciprofloxacin  500 mg Oral Daily  . collagenase   Topical Daily  . darbepoetin (ARANESP)  injection - DIALYSIS  200 mcg Intravenous Q Wed-HD  . dexamethasone  3 mg Oral Q8H  . diltiazem  120 mg Oral QHS  . feeding supplement (ENSURE ENLIVE)  237 mL Oral BID BM  . guaiFENesin  600 mg Oral BID  . insulin aspart  0-5 Units Subcutaneous QHS  . insulin aspart  0-9 Units Subcutaneous TID WC  . insulin aspart  2 Units Subcutaneous TID WC  . insulin glargine  12 Units Subcutaneous Daily  . metoprolol tartrate  12.5 mg Oral BID  . mirtazapine  30 mg Oral QHS  . multivitamin  1 tablet Oral QHS    ROS: Patient with encephalopathy and decreased concentration currently poor historian unable to provide review of systems or even HPI  Physical Examination: Blood pressure (!) 153/74, pulse 79, temperature 98 F (36.7 C), temperature source Oral, resp. rate 20, height '6\' 4"'$  (1.93 m), weight 127 kg (280 lb), SpO2 100 %. HEENT-  Normocephalic, Normal external eye and conjunctiva.   Cardiovascular- S1-S2 audible, pulses palpable throughout   Lungs-coarse breath sounds, no increased work of breathing Abdomen- All 4 quadrants palpated and nontender Musculoskeletal- flaccid extremities Skin- multiple wounds in different stages of healing on skin , sacral decub, BLE in  Dressing in UNA boots   Neurological Examination Mental Status: Alert, self and place, knows he is in the big hospital in [place].  Speech fluent without evidence of aphasia.  Follows three-step commands without difficulty.  Patient with encephalopathy, but when given time to answer questions has moderate concentration, naming and repetition. Cranial Nerves: II: Visual fields grossly normal,  III,IV, VI: ptosis not present, extra-ocular motions intact bilaterally pupils equal, round, reactive to light  V,VII: smile symmetric, facial light touch sensation normal bilaterally VIII: Hearing intact to voice IX,X: uvula rises symmetrically XI: bilateral shoulder shrug XII: midline tongue extension Motor: With atrophy in  bilateral upper and lower extremities and using his shoulder strength for movement of bilateral upper extremities.  Able to raise bilateral upper extremities at elbow level for 5 seconds before dropping down and unable to raise bilateral lower extremities due to effort related weakness He has a spastic paresis of bilateral upper extremities, plegic in the lower extremity Sensory: Withdraws to sensation Deep Tendon Reflexes: Muted  plantars: Patient feet dressed in una boots Cerebellar: -related weakness in attempting normal finger-to-nose Gait: Not assessed secondary to safety   Results for orders placed or performed during the hospital encounter of 04/25/2017 (from the past 48 hour(s))  Glucose, capillary     Status: Abnormal   Collection Time: 06/03/17  5:04 PM  Result Value Ref Range   Glucose-Capillary 146 (H) 65 - 99 mg/dL   Comment 1 Notify RN    Comment 2 Document in Chart   Glucose, capillary     Status: Abnormal   Collection Time: 06/03/17  9:05 PM  Result Value Ref Range   Glucose-Capillary 167 (H) 65 - 99 mg/dL  Glucose, capillary     Status: Abnormal   Collection Time: 06/04/17  7:08 AM  Result Value Ref Range   Glucose-Capillary 163 (H) 65 - 99 mg/dL  Renal function panel     Status: Abnormal   Collection Time: 06/04/17  8:25 AM  Result Value Ref Range   Sodium 137 135 - 145 mmol/L   Potassium 4.3 3.5 - 5.1 mmol/L   Chloride 99 (L) 101 - 111 mmol/L   CO2 23 22 - 32 mmol/L   Glucose, Bld 187 (H) 65 - 99 mg/dL   BUN 33 (H) 6 - 20 mg/dL   Creatinine, Ser 2.88 (H) 0.61 - 1.24 mg/dL   Calcium 6.6 (L) 8.9 - 10.3 mg/dL   Phosphorus 3.9 2.5 - 4.6 mg/dL   Albumin 1.8 (L) 3.5 - 5.0 g/dL   GFR calc non Af Amer 24 (L) >60 mL/min   GFR calc Af Amer 28 (L) >60 mL/min    Comment: (NOTE) The eGFR has been calculated using the CKD EPI equation. This calculation has not been validated in all clinical situations. eGFR's persistently <60 mL/min signify possible Chronic  Kidney Disease.    Anion gap 15 5 - 15    Comment: Performed at [facility] [address] [place], [place] [address]  Glucose, capillary     Status: Abnormal   Collection Time: 06/04/17  1:38 PM  Result Value Ref Range   Glucose-Capillary 177 (H) 65 - 99 mg/dL  Glucose, capillary     Status: Abnormal   Collection Time: 06/04/17  4:38 PM  Result Value Ref Range   Glucose-Capillary 192 (H) 65 - 99 mg/dL  Glucose, capillary     Status: Abnormal   Collection Time: 06/04/17  9:14 PM  Result Value Ref Range   Glucose-Capillary 197 (H) 65 - 99 mg/dL   Comment 1 Notify RN    Comment 2 Document in Chart   Renal function panel     Status: Abnormal   Collection Time: 06/05/17  3:59 AM  Result Value Ref Range   Sodium 138 135 - 145 mmol/L   Potassium 4.2 3.5 - 5.1 mmol/L   Chloride 99 (L) 101 - 111 mmol/L   CO2 26 22 - 32 mmol/L   Glucose, Bld 193 (H) 65 - 99 mg/dL   BUN 36 (H) 6 - 20 mg/dL   Creatinine, Ser 3.22 (H) 0.61 - 1.24 mg/dL   Calcium 6.9 (L) 8.9 - 10.3 mg/dL   Phosphorus 4.3 2.5 - 4.6 mg/dL   Albumin 1.8 (L) 3.5 - 5.0 g/dL   GFR calc non Af Amer 21 (L) >60 mL/min   GFR calc Af Amer 24 (L) >60 mL/min    Comment: (NOTE) The eGFR has been calculated using the CKD EPI equation. This calculation has not been validated in all clinical situations. eGFR's persistently <60 mL/min signify possible Chronic Kidney Disease.    Anion gap 13 5 - 15    Comment: Performed at [facility] [address] [place], [place] [address]  CBC     Status: Abnormal   Collection Time: 06/05/17  3:59 AM  Result Value Ref Range   WBC 9.9 4.0 - 10.5 K/uL   RBC 2.80 (L) 4.22 - 5.81 MIL/uL   Hemoglobin 8.2 (L) 13.0 - 17.0 g/dL   HCT 24.6 (L) 39.0 - 52.0 %   MCV 87.9 78.0 - 100.0 fL   MCH 29.3 26.0 - 34.0 pg   MCHC 33.3 30.0 - 36.0 g/dL   RDW 15.7 (H) 11.5 - 15.5 %   Platelets 46 (L) 150 - 400 K/uL    Comment: CONSISTENT WITH PREVIOUS RESULT Performed at [facility],  [place] [address] [place], [place] [address]   Hepatic function panel  Status: Abnormal   Collection Time: 06/05/17  4:58 AM  Result Value Ref Range   Total Protein 5.3 (L) 6.5 - 8.1 g/dL   Albumin 1.8 (L) 3.5 - 5.0 g/dL   AST 23 15 - 41 U/L   ALT 56 17 - 63 U/L   Alkaline Phosphatase 115 38 - 126 U/L   Total Bilirubin 1.1 0.3 - 1.2 mg/dL   Bilirubin, Direct 0.1 0.1 - 0.5 mg/dL   Indirect Bilirubin 1.0 (H) 0.3 - 0.9 mg/dL    Comment: Performed at [facility] [address], [place], [place] [address]  Glucose, capillary     Status: Abnormal   Collection Time: 06/05/17  6:38 AM  Result Value Ref Range   Glucose-Capillary 176 (H) 65 - 99 mg/dL   Comment 1 Notify RN    Comment 2 Document in Chart   Glucose, capillary     Status: Abnormal   Collection Time: 06/05/17 11:40 AM  Result Value Ref Range   Glucose-Capillary 193 (H) 65 - 99 mg/dL   Ct Head Wo Contrast  06/04/2017 IMPRESSION:  Unchanged findings of chronic demyelination without acute intracranial abnormality.   Mr Brain Wo Contrast 06/04/2017 IMPRESSION:  1. Findings consistent with chronic demyelinating disease, similar in appearance in distribution relative to prior MRI. Mild diffusion abnormality about lesions within the bilateral occipital lobes and left thalamus may reflect mild acute versus subacute active demyelination changes.  2. No other acute intracranial abnormality identified.  3. Mildly advanced cerebral atrophy for age.   Assessment: 52 y.o. male with a complicating prolonged hospital stay secondary to complications from corpectomy decompression on 03/25/2017.  Patient returned back to the hospital on 04/11/2017 to present with complicating Acute renal failure, severe thrombocytopenia, neurogenic bladder, quadriplegia, sacral decubitus and ensuing encephalopathy.  1.  Delirium with acute encephalopathy likely secondary to multiple comorbidities and infectious process and patient with prolonged hospital stay.  CT  of the head and MRI of the brain did not show any acute intracranial processes but MRI did show mild acute versus subacute active demyelination changes consistent with patient's extensive history of multiple sclerosis.  Neuro will recommend continued aggressive management of current metabolic and infectious comorbidities and continued reorientation of patient, restoring normal sleep and waking cycles as patient is not to be sleeping throughout the day.  2.  Sepsis with frank anemia 3. GI bleed/acute blood loss/acute on chronic anemia 4. Quadriplegia/cervical abscess versus cervical hematoma/infected cervical hematoma 5.  Multiple sclerosis 6.  unstageable pressure injury on bilateral buttocks 7.  Nocturnal hypoxia patient with a history of obstructive sleep apnea oral use of CPAP 8.  Adjustment disorder/depression 9. Afib  Plan: -Aggressive management of metabolic and infectious etiologies. -Restore sleep wake cycles and reorient patient frequently -  Frequent neuro checks   [facility] Team [phone] 06/05/2017, 2:16 PM  At this time, he appears to be improving.  I think that he likely has mild delirium due to gram-negative UTI, glucocorticoids, potentially could be related to ceftriaxone in the setting of his renal function(now changed to Cipro).  I do not think that the lesion seen on MRI are likely be causative of his symptoms, but even if they are I am not certain that steroids would have any acute role given that he has been on fairly significant doses of steroids for so long.  I would recommend weaning his steroids, limiting narcotics, treating underlying metabolic/infectious issues.  Roland Rack, MD [facility] [phone]  If 7pm-  7am, please page neurology on call as listed in [facility].

## 2017-06-05 NOTE — Progress Notes (Signed)
PROGRESS NOTE  Dylan [name] OVF:[id number] DOB: 29-Sep-1965 DOA: 04/11/2017 PCP: Dylan [name], No Pcp Per   LOS: 35 days   Brief Narrative / Interim history: Dylan [name] is a 52 year old male with past medical history significant for recent quadriplegia, multiple sclerosis, decompression of the spinal abscess, spinal stenosis who has been  admitted for the management of acute kidney injury.  He was recently admitted to neurosurgery on 04/12/2017 and had surgery(corpectomy/decompression surgery) on 03/25/17 for cord compression due to spinal stenosis at C6 ,C5 and C7.  Postoperative course was completed with atrial fibrillation, started on Eliquis but discontinued due to spinal hematoma. Also epidural clot versus abscess was suspected on his follow up  C-spine MRI. Because of the high risk Dylan [name] decided not to repeat exploratory surgery.  He was also started on IV antibiotics due to complicated UTI ,possible cervical abscess and was discharged to skilled nursing facility on 04/09/2017.   His Foley catheter was not draining at the skilled nursing facility so he was sent to the emergency department.  Found to have acute kidney injury. Urology was also consulted, flushed his Foley with removal of foreign body yeilding 2.5 L of urine. Currently Dylan [name] has been managed for acute kidney injury thought to be secondary to possible vancomycin related.  Nephrology following.  Dylan [name] hospital course is also complicated with sepsis, Candida glabrata fungal anemia, progressive oliguric renal failure, hyperkalemia , thrombocytopenia. Dylan [name] underwent right-sided tunneled catheter placement for hemodialysis and right brachiocephalic AV fistula on 06/01/17. His disposition will be challenging .Currently he may not be able to sit for dialysis due to ulcers on his buttocks and quadriplegia.  He might need SNF with dialysis facility out of state.  Social worker following.    Assessment & Plan: Principal Problem:   AKI  (acute kidney injury) (HCC) Active Problems:   Uncontrolled diabetes mellitus type 2 without complications (HCC)   Hypertension   Morbid obesity (HCC)   Paroxysmal atrial fibrillation (HCC)   Multiple sclerosis exacerbation (HCC)   Pressure injury of skin   Hyperkalemia   Hematoma   Insomnia   Adjustment reaction with anxiety and depression   Quadriplegia (HCC)   History of fusion of cervical spine   Postoperative wound infection   Coronary artery disease   Atherosclerotic peripheral vascular disease (HCC)   MDD (major depressive disorder), single episode, moderate (HCC)   Candidemia (HCC)   Goals of care, counseling/discussion   Palliative care by specialist   Acute lower UTI   Acute renal failure: Thought to be secondary to obstructive uropathy versus vancomycin-induced renal injury.  Nephrology is following.  He has been started on dialysis since 05/07/2017. Permanent  dialysis catheter placed on 05/21/2017.  There is also concern that Dylan [name] might need stretcher dialysis which would be possible only out of state. S/P right brachiocephalic AV fistula per vascular surgeon -Nephrology following, will wait to see if he needs further dialysis -Appears that the Foley is clogged again, change the Foley to a larger bore today  Acute encephalopathy -Dylan [name] slightly more sleepy yesterday, could not tell the year, new. CT scan negative -also with a UTI. Treated based on sensitivities -Obtained an MRI of the brain which showed mild diffusion abnormality about lesions within the bilateral occipital lobes and left thalamus which may reflect mild acute versus subacute active demyelination changes -Discussed findings with Dr. Amada Jupiter from neurology, will consult, appreciate input  Severe thrombocytopenia: Thought to be secondary to drug related.  Anidulafungin, daptomycin and cefepime stopped.  Protonix also  stopped.  HIT negative.  Dylan [name] does not receive heparin with dialysis.  Hematology  was also consulted .He has received several units of platelets transfusion. Hematology feels that this is not likely immune medicine thrombocytopenia given Dylan [name] response to transfusion.  -Dr. Clelia Croft following intermittently -Platelets overall stable, now starting to improve, 40 6K today  GI bleed/acute blood loss anemia/acute on chronic anemia: Protonix stopped due to thrombocytopenia.  -Status post 2 units of PRBC few days ago.  No further bleeding noted. -GI was on board due to her large episode of hematochezia, self resolved.  Recommending continue close monitoring.  Recommended to reconsult if rebleeding.  No new episodes of hematochezia . -GI reluctant to do any kind of procedures unless his platelets are more than 50,000.  Elevated troponin: Mild.  Did not trend up.  Dylan [name] denies any chest pain.  Likely supply demand ischemia  Quadriplegia/cervical abscess versus cervical hematoma/infected cervical hematoma: Dylan [name] completed 6 weeks of antibiotics including 4 weeks of vancomycin and cefepime followed by 2 weeks of daptomycin and cefepime.  Course completed on 05/16/2017. Neurosurgery was following before.  ID was following,signed off.  Neurogenic bladder: Dylan [name] was discharged with Foley catheter on 04/09/2017 in the context of new quadriplegia. Foley catheter exhanged on admission. Foley was taken out during this admission but had to be put back again due to urinary retention.  Continue Foley catheter -Exchange Foley today  UTI: Urine culture growing Ecoli, staph as well as Pseudomonas. Pseudomonas is resistant to Cefepime. Changed to Cipro, plan for total of 5 days, today is day 2  Candidemia: Dylan [name] had 10 days of  Anidulafungin which has been discontinued due to thrombocytopenia. Ophthalmology also consulted, no evidence of intraocular fungal infection.  Echocardiogram did not show any vegetation.  Diabetes type 2: Continue Lantus, sliding scale insulin.  We will  continue monitor blood sugars. CBGs 160s today   Paroxysmal A. fib: No anticoagulation given his current thrombocytopenia.  Continue amiodarone, metoprolol and Cardizem. -sinus on telemetry   Multiple sclerosis: Recently diagnosed on previous admission and he was given 5 days of high-dose steroids with improvement.  Neurology was consulted at that time.  -MRI obtained on 5/3 as described above, neurology was consulted on 5/4, appreciate input  Adjustment disorder/depression: Continue mirtazapine.  Psychiatry was following.  Recommended trazodone.  Hyponatremia/hypokalemia: Currently stable  Unstageable pressure injury on bilateral buttocks: Continue supportive care.  Continue wound care.  His pressure injury on bilateral buttocks is making him unable to sit for dialysis.  Nocturnal hypoxia: No history of OSA or the use of CPAP.  Continue supplemental oxygen as needed/CPAP at night.  Goals of care: Palliative care was consulted before.  Remains full code    DVT prophylaxis: SCD Code Status: Full code Family Communication: d/w extensive family members last night 5/2 Disposition Plan: Skilled nursing facility after improvement in thrombocytopenia , hematology evaluation, nephrology clearance and finding of skilled nursing facility with stretcher dialysis facility  Consultants:   Nephrology   Hematology   Neurology  Psychiatry   ID  Ophthalmology  GI  Procedures:  Echocardiogram, upper external vein mapping, HD  Antimicrobials:  Ciprofloxacin 5/2 >> Prior antibiotics include daptomycin, Eraxis, cefepime, vancomycin, Zosyn  Subjective: -He is sleeping when I entered the room but wakes up easily, remains disoriented to time but answers appropriately all other questions  Objective: Vitals:   06/04/17 2259 06/04/17 2335 06/05/17 0407 06/05/17 0835  BP: (!) 176/90 (!) 174/88 (!) 179/88 131/87  Pulse: 71 69 70 70  Resp:  20 20 20   Temp:  (!) 97.5 F (36.4 C) 97.6 F  (36.4 C) 98.2 F (36.8 C)  TempSrc:  Oral Oral Oral  SpO2:  100% 100% 100%  Weight:      Height:        Intake/Output Summary (Last 24 hours) at 06/05/2017 1138 Last data filed at 06/05/2017 1109 Gross per 24 hour  Intake 50 ml  Output 800 ml  Net -750 ml   Filed Weights   06/01/17 0500 06/02/17 0029  Weight: 122 kg (269 lb) 127 kg (280 lb)    Examination:  Constitutional: No apparent distress Eyes: No scleral icterus, lids and conjunctivae normal Respiratory: Clear to auscultation bilaterally without wheezing or crackles, overall shallow breath sounds Cardiovascular: Regular rate and rhythm without murmurs, no edema seen Abdomen: Soft, nontender, nondistended, bowel sounds positive Skin: No rashes, large pressure wound on the sacral area measuring roughly 20 x 10 cm Neurologic: Quadriplegic Psychiatric: Alert and oriented to person and place, not time   Data Reviewed: I have independently reviewed following labs and imaging studies   CBC: Recent Labs  Lab 05/30/17 0811 05/10/2017 0237 06/01/17 0923 06/02/17 0642 06/03/17 0500 06/05/17 0359  WBC 7.9 6.9 9.8 9.5 12.0* 9.9  NEUTROABS 6.5 5.5 8.8* 8.8*  --   --   HGB 8.1* 7.9* 7.4* 7.9* 7.9* 8.2*  HCT 24.1* 24.2* 21.9* 23.5* 23.7* 24.6*  MCV 88.0 87.1 88.0 86.7 87.1 87.9  PLT 58* 43* 49* 38* 37* 46*   Basic Metabolic Panel: Recent Labs  Lab 05/30/17 0811  06/01/17 0923 06/02/17 0642 06/03/17 0500 06/04/17 0825 06/05/17 0359  NA 136   < > 137 139 138 137 138  K 4.4   < > 4.9 4.9 3.6 4.3 4.2  CL 100*   < > 102 103 100* 99* 99*  CO2 28   < > 21* 23 28 23 26   GLUCOSE 84   < > 207* 190* 114* 187* 193*  BUN 38*   < > 50* 61* 24* 33* 36*  CREATININE 3.13*   < > 4.09* 4.41* 2.22* 2.88* 3.22*  CALCIUM 6.8*   < > 6.7* 6.6* 6.5* 6.6* 6.9*  PHOS 3.4  --  5.1*  --   --  3.9 4.3   < > = values in this interval not displayed.   GFR: Estimated Creatinine Clearance: 39.9 mL/min (A) (by C-G formula based on SCr of 3.22  mg/dL (H)). Liver Function Tests: Recent Labs  Lab 05/30/17 0811 06/01/17 0923 06/04/17 0825 06/05/17 0359 06/05/17 0458  AST  --   --   --   --  23  ALT  --   --   --   --  56  ALKPHOS  --   --   --   --  115  BILITOT  --   --   --   --  1.1  PROT  --   --   --   --  5.3*  ALBUMIN 2.0* 1.8* 1.8* 1.8* 1.8*   No results for input(s): LIPASE, AMYLASE in the last 168 hours. No results for input(s): AMMONIA in the last 168 hours. Coagulation Profile: Recent Labs  Lab 05/15/2017 0237  INR 1.31   Cardiac Enzymes: No results for input(s): CKTOTAL, CKMB, CKMBINDEX, TROPONINI in the last 168 hours. BNP (last 3 results) No results for input(s): PROBNP in the last 8760 hours. HbA1C: No results for input(s): HGBA1C in the last 72 hours. CBG: Recent Labs  Lab 06/04/17 0708 06/04/17 1338 06/04/17 1638 06/04/17 2114 06/05/17 0638  GLUCAP 163* 177* 192* 197* 176*   Lipid Profile: No results for input(s): CHOL, HDL, LDLCALC, TRIG, CHOLHDL, LDLDIRECT in the last 72 hours. Thyroid Function Tests: No results for input(s): TSH, T4TOTAL, FREET4, T3FREE, THYROIDAB in the last 72 hours. Anemia Panel: No results for input(s): VITAMINB12, FOLATE, FERRITIN, TIBC, IRON, RETICCTPCT in the last 72 hours. Urine analysis:    Component Value Date/Time   COLORURINE YELLOW 05/29/2017 0340   APPEARANCEUR CLOUDY (A) 05/29/2017 0340   LABSPEC 1.009 05/29/2017 0340   PHURINE 6.0 05/29/2017 0340   GLUCOSEU NEGATIVE 05/29/2017 0340   HGBUR MODERATE (A) 05/29/2017 0340   BILIRUBINUR NEGATIVE 05/29/2017 0340   KETONESUR NEGATIVE 05/29/2017 0340   PROTEINUR 100 (A) 05/29/2017 0340   NITRITE NEGATIVE 05/29/2017 0340   LEUKOCYTESUR LARGE (A) 05/29/2017 0340   Sepsis Labs: Invalid input(s): PROCALCITONIN, LACTICIDVEN  Recent Results (from the past 240 hour(s))  Culture, Urine     Status: Abnormal   Collection Time: 05/29/17  3:38 AM  Result Value Ref Range Status   Specimen Description URINE,  CATHETERIZED  Final   Special Requests   Final    NONE Performed at [facility], [address], [place], [place] [address]    Culture (A)  Final    >=100,000 COLONIES/mL ESCHERICHIA COLI >=100,000 COLONIES/mL PSEUDOMONAS AERUGINOSA 20,000 COLONIES/mL STAPHYLOCOCCUS AUREUS    Report Status 06/03/2017 FINAL  Final   Organism ID, Bacteria ESCHERICHIA COLI (A)  Final   Organism ID, Bacteria PSEUDOMONAS AERUGINOSA (A)  Final   Organism ID, Bacteria STAPHYLOCOCCUS AUREUS (A)  Final      Susceptibility   Escherichia coli - MIC*    AMPICILLIN >=32 RESISTANT Resistant     CEFAZOLIN <=4 SENSITIVE Sensitive     CEFTRIAXONE <=1 SENSITIVE Sensitive     CIPROFLOXACIN <=0.25 SENSITIVE Sensitive     GENTAMICIN <=1 SENSITIVE Sensitive     IMIPENEM <=0.25 SENSITIVE Sensitive     NITROFURANTOIN <=16 SENSITIVE Sensitive     TRIMETH/SULFA <=20 SENSITIVE Sensitive     AMPICILLIN/SULBACTAM 4 SENSITIVE Sensitive     PIP/TAZO <=4 SENSITIVE Sensitive     * >=100,000 COLONIES/mL ESCHERICHIA COLI   Pseudomonas aeruginosa - MIC*    CEFTAZIDIME >=64 RESISTANT Resistant     CIPROFLOXACIN <=0.25 SENSITIVE Sensitive     GENTAMICIN 4 SENSITIVE Sensitive     IMIPENEM 2 SENSITIVE Sensitive     CEFEPIME 32 RESISTANT Resistant     * >=100,000 COLONIES/mL PSEUDOMONAS AERUGINOSA   Staphylococcus aureus - MIC*    CIPROFLOXACIN <=0.5 SENSITIVE Sensitive     GENTAMICIN <=0.5 SENSITIVE Sensitive     NITROFURANTOIN <=16 SENSITIVE Sensitive     OXACILLIN <=0.25 SENSITIVE Sensitive     TETRACYCLINE <=1 SENSITIVE Sensitive     VANCOMYCIN 1 SENSITIVE Sensitive     TRIMETH/SULFA <=10 SENSITIVE Sensitive     CLINDAMYCIN <=0.25 SENSITIVE Sensitive     RIFAMPIN <=0.5 SENSITIVE Sensitive     Inducible Clindamycin NEGATIVE Sensitive     * 20,000 COLONIES/mL STAPHYLOCOCCUS AUREUS      Radiology Studies: Ct Head Wo Contrast  Result Date: 06/04/2017 CLINICAL DATA:  Encephalopathy EXAM: CT HEAD WITHOUT CONTRAST  TECHNIQUE: Contiguous axial images were obtained from the base of the skull through the vertex without intravenous contrast. COMPARISON:  03/31/2016 head CT Brain MRI 03/23/2017 FINDINGS: Brain: No mass lesion, intraparenchymal hemorrhage or extra-axial collection. No evidence of acute cortical infarct. Multiple hypoattenuating lesions  of the white matter are unchanged. Vascular: No hyperdense vessel or unexpected vascular calcification. Skull: Normal visualized skull base, calvarium and extracranial soft tissues. Sinuses/Orbits: No sinus fluid levels or advanced mucosal thickening. No mastoid effusion. Normal orbits. IMPRESSION: Unchanged findings of chronic demyelination without acute intracranial abnormality. Electronically Signed   By: Deatra Robinson M.D.   On: 06/04/2017 01:32   Mr Brain Wo Contrast  Result Date: 06/04/2017 CLINICAL DATA:  Initial evaluation for acute encephalopathy. History of multiple sclerosis. EXAM: MRI HEAD WITHOUT CONTRAST TECHNIQUE: Multiplanar, multiecho pulse sequences of the brain and surrounding structures were obtained without intravenous contrast. COMPARISON:  Prior CT from earlier the same day is well as previous brain MRI from 03/23/2017. FINDINGS: Brain: Mildly advanced cerebral volume loss for age, stable from previous. Again seen are multiple T2/FLAIR hyperintense foci involving the periventricular and deep white matter both cerebral hemispheres in a distribution and appearance most consistent with chronic demyelinating disease/multiple sclerosis. Lesions involving the left thalamus and left basal ganglia, with multiple infratentorial lesions involving the brainstem and bilateral cerebellar hemispheres again noted. There is faint diffusion abnormality seen about lesions involving the bilateral occipital lobes (series 09811, image 38) as well as the left thalamus (series 5001, image 59). Difficult to exclude a degree of mild active and/or subacute demyelination. No other  significant diffusion abnormality or edema to suggest active demyelination identified. No evidence for acute infarct. Gray-white matter differentiation maintained. No acute intracranial hemorrhage. Few small chronic micro hemorrhages noted within the left cerebellar hemisphere, chronic in appearance, but new relative to most recent MRI from 03/23/2017. No mass lesion, mass effect, or midline shift. No hydrocephalus. No extra-axial fluid collection. Pituitary gland suprasellar region normal. Midline structures intact. Vascular: Major intracranial vascular flow voids maintained. Skull and upper cervical spine: Craniocervical junction within normal limits. Bone marrow signal intensity normal. No scalp soft tissue abnormality. Sinuses/Orbits: Globes and orbital soft tissues demonstrate no acute abnormality. Mild scattered mucosal thickening within the ethmoidal air cells. Paranasal sinuses are otherwise clear. No mastoid effusion. Inner ear structures normal. Other: None IMPRESSION: 1. Findings consistent with chronic demyelinating disease, similar in appearance in distribution relative to prior MRI. Mild diffusion abnormality about lesions within the bilateral occipital lobes and left thalamus may reflect mild acute versus subacute active demyelination changes. 2. No other acute intracranial abnormality identified. 3. Mildly advanced cerebral atrophy for age. Electronically Signed   By: Rise Mu M.D.   On: 06/04/2017 19:36     Scheduled Meds: . amiodarone  200 mg Oral Daily  . ciprofloxacin  500 mg Oral Daily  . collagenase   Topical Daily  . darbepoetin (ARANESP) injection - DIALYSIS  200 mcg Intravenous Q Wed-HD  . dexamethasone  3 mg Oral Q8H  . diltiazem  120 mg Oral QHS  . feeding supplement (ENSURE ENLIVE)  237 mL Oral BID BM  . guaiFENesin  600 mg Oral BID  . insulin aspart  0-5 Units Subcutaneous QHS  . insulin aspart  0-9 Units Subcutaneous TID WC  . insulin aspart  2 Units  Subcutaneous TID WC  . insulin glargine  12 Units Subcutaneous Daily  . metoprolol tartrate  12.5 mg Oral BID  . mirtazapine  30 mg Oral QHS  . multivitamin  1 tablet Oral QHS   Continuous Infusions: . sodium chloride    . sodium chloride    . sodium chloride    . ferric gluconate (FERRLECIT/NULECIT) IV 125 mg (06/05/17 1137)    Pamella Pert, MD, PhD  [facility] Pager [phone]  If 7PM-7AM, please contact night-coverage [email or web address] Password [email or web address] 06/05/2017, 11:38 AM

## 2017-06-05 NOTE — Progress Notes (Signed)
[facility] called and patient was ordered a bariatric vita lite bed. [facility] states they had to specially order that bed and that it would not be here until Monday 5/6. Patient's bony prominences were paded on sides of bed and patient pulled up frequently though rout shift in order to move feet from bottom of the bed.

## 2017-06-05 NOTE — Procedures (Signed)
Patient refused CPAP for the night.  Patient is aware to ask RN to call Respiratory if he changes his mind. 

## 2017-06-05 NOTE — Progress Notes (Signed)
Subjective: Interval History: has no complaint, feels better today.  Objective: Vital signs in last 24 hours: Temp:  [97.5 F (36.4 C)-98.2 F (36.8 C)] 97.6 F (36.4 C) (05/04 0407) Pulse Rate:  [69-72] 70 (05/04 0407) Resp:  [18-20] 20 (05/04 0407) BP: (142-179)/(84-91) 179/88 (05/04 0407) SpO2:  [100 %] 100 % (05/04 0407) Weight change:   Intake/Output from previous day: 05/03 0701 - 05/04 0700 In: 290 [P.O.:290] Out: 900 [Urine:900] Intake/Output this shift: No intake/output data recorded.  General appearance: alert, cooperative, no distress and moderately obese Resp: diminished breath sounds bilaterally Chest wall: RIJ cath Cardio: S1, S2 normal and systolic murmur: systolic ejection 2/6, decrescendo at 2nd left intercostal space GI: obese, pos bs Extremities: edema 1+ and AVF RUA,  braces/boots on feet  Lab Results: Recent Labs    06/03/17 0500 06/05/17 0359  WBC 12.0* 9.9  HGB 7.9* 8.2*  HCT 23.7* 24.6*  PLT 37* 46*   BMET:  Recent Labs    06/04/17 0825 06/05/17 0359  NA 137 138  K 4.3 4.2  CL 99* 99*  CO2 23 26  GLUCOSE 187* 193*  BUN 33* 36*  CREATININE 2.88* 3.22*  CALCIUM 6.6* 6.9*   No results for input(s): PTH in the last 72 hours. Iron Studies: No results for input(s): IRON, TIBC, TRANSFERRIN, FERRITIN in the last 72 hours.  Studies/Results: Ct Head Wo Contrast  Result Date: 06/04/2017 CLINICAL DATA:  Encephalopathy EXAM: CT HEAD WITHOUT CONTRAST TECHNIQUE: Contiguous axial images were obtained from the base of the skull through the vertex without intravenous contrast. COMPARISON:  03/31/2016 head CT Brain MRI 03/23/2017 FINDINGS: Brain: No mass lesion, intraparenchymal hemorrhage or extra-axial collection. No evidence of acute cortical infarct. Multiple hypoattenuating lesions of the white matter are unchanged. Vascular: No hyperdense vessel or unexpected vascular calcification. Skull: Normal visualized skull base, calvarium and extracranial  soft tissues. Sinuses/Orbits: No sinus fluid levels or advanced mucosal thickening. No mastoid effusion. Normal orbits. IMPRESSION: Unchanged findings of chronic demyelination without acute intracranial abnormality. Electronically Signed   By: Deatra Robinson M.D.   On: 06/04/2017 01:32   Mr Brain Wo Contrast  Result Date: 06/04/2017 CLINICAL DATA:  Initial evaluation for acute encephalopathy. History of multiple sclerosis. EXAM: MRI HEAD WITHOUT CONTRAST TECHNIQUE: Multiplanar, multiecho pulse sequences of the brain and surrounding structures were obtained without intravenous contrast. COMPARISON:  Prior CT from earlier the same day is well as previous brain MRI from 03/23/2017. FINDINGS: Brain: Mildly advanced cerebral volume loss for age, stable from previous. Again seen are multiple T2/FLAIR hyperintense foci involving the periventricular and deep white matter both cerebral hemispheres in a distribution and appearance most consistent with chronic demyelinating disease/multiple sclerosis. Lesions involving the left thalamus and left basal ganglia, with multiple infratentorial lesions involving the brainstem and bilateral cerebellar hemispheres again noted. There is faint diffusion abnormality seen about lesions involving the bilateral occipital lobes (series 47096, image 38) as well as the left thalamus (series 5001, image 59). Difficult to exclude a degree of mild active and/or subacute demyelination. No other significant diffusion abnormality or edema to suggest active demyelination identified. No evidence for acute infarct. Gray-white matter differentiation maintained. No acute intracranial hemorrhage. Few small chronic micro hemorrhages noted within the left cerebellar hemisphere, chronic in appearance, but new relative to most recent MRI from 03/23/2017. No mass lesion, mass effect, or midline shift. No hydrocephalus. No extra-axial fluid collection. Pituitary gland suprasellar region normal. Midline  structures intact. Vascular: Major intracranial vascular flow voids maintained. Skull  and upper cervical spine: Craniocervical junction within normal limits. Bone marrow signal intensity normal. No scalp soft tissue abnormality. Sinuses/Orbits: Globes and orbital soft tissues demonstrate no acute abnormality. Mild scattered mucosal thickening within the ethmoidal air cells. Paranasal sinuses are otherwise clear. No mastoid effusion. Inner ear structures normal. Other: None IMPRESSION: 1. Findings consistent with chronic demyelinating disease, similar in appearance in distribution relative to prior MRI. Mild diffusion abnormality about lesions within the bilateral occipital lobes and left thalamus may reflect mild acute versus subacute active demyelination changes. 2. No other acute intracranial abnormality identified. 3. Mildly advanced cerebral atrophy for age. Electronically Signed   By: Rise Mu M.D.   On: 06/04/2017 19:36    I have reviewed the patient's current medications.  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  Slow rise in Cr and more urine if can keep foley functional. 2 Obstruction discussed with primary, change to larger foley, irrigate 3 Obesity 4 Quad 5 Anemia improved 6 HPTH vit D 7 intermittent Afib P follow chem, change foley, follow urine    LOS: 35 days   Dylan [name] 06/05/2017,7:19 AM

## 2017-06-06 ENCOUNTER — Other Ambulatory Visit: Payer: Self-pay

## 2017-06-06 DIAGNOSIS — R5383 Other fatigue: Secondary | ICD-10-CM

## 2017-06-06 LAB — RENAL FUNCTION PANEL
Albumin: 1.8 g/dL — ABNORMAL LOW (ref 3.5–5.0)
Anion gap: 12 (ref 5–15)
BUN: 48 mg/dL — ABNORMAL HIGH (ref 6–20)
CHLORIDE: 103 mmol/L (ref 101–111)
CO2: 24 mmol/L (ref 22–32)
CREATININE: 3.48 mg/dL — AB (ref 0.61–1.24)
Calcium: 7 mg/dL — ABNORMAL LOW (ref 8.9–10.3)
GFR, EST AFRICAN AMERICAN: 22 mL/min — AB (ref >60)
GFR, EST NON AFRICAN AMERICAN: 19 mL/min — AB (ref >60)
Glucose, Bld: 241 mg/dL — ABNORMAL HIGH (ref 65–99)
POTASSIUM: 5 mmol/L (ref 3.5–5.1)
Phosphorus: 4.2 mg/dL (ref 2.5–4.6)
Sodium: 139 mmol/L (ref 135–145)

## 2017-06-06 LAB — GLUCOSE, CAPILLARY
GLUCOSE-CAPILLARY: 252 mg/dL — AB (ref 65–99)
GLUCOSE-CAPILLARY: 259 mg/dL — AB (ref 65–99)
GLUCOSE-CAPILLARY: 250 mg/dL — AB (ref 65–99)
Glucose-Capillary: 111 mg/dL — ABNORMAL HIGH (ref 65–99)

## 2017-06-06 LAB — TSH: TSH: 1.456 u[IU]/mL (ref 0.350–4.500)

## 2017-06-06 LAB — AMMONIA: Ammonia: 29 umol/L (ref 9–35)

## 2017-06-06 LAB — VITAMIN B12: VITAMIN B 12: 944 pg/mL — AB (ref 180–914)

## 2017-06-06 MED ORDER — AMLODIPINE BESYLATE 5 MG PO TABS
5.0000 mg | ORAL_TABLET | Freq: Every day | ORAL | Status: DC
  Administered 2017-06-06 – 2017-06-10 (×5): 5 mg via ORAL
  Filled 2017-06-06 (×5): qty 1

## 2017-06-06 MED ORDER — DEXAMETHASONE 2 MG PO TABS
3.0000 mg | ORAL_TABLET | Freq: Two times a day (BID) | ORAL | Status: DC
  Administered 2017-06-06 – 2017-06-10 (×9): 3 mg via ORAL
  Filled 2017-06-06 (×9): qty 2

## 2017-06-06 NOTE — Progress Notes (Signed)
Subjective: Interval History: has no complaints.  Lethargic this am.  Objective: Vital signs in last 24 hours: Temp:  [97.5 F (36.4 C)-98.2 F (36.8 C)] 97.5 F (36.4 C) (05/05 0358) Pulse Rate:  [64-79] 64 (05/05 0358) Resp:  [20] 20 (05/05 0358) BP: (131-180)/(74-89) 165/87 (05/05 0358) SpO2:  [100 %] 100 % (05/05 0358) Weight:  [98.7 kg (217 lb 9.6 oz)] 98.7 kg (217 lb 9.6 oz) (05/05 0358) Weight change:   Intake/Output from previous day: 05/04 0701 - 05/05 0700 In: 690 [P.O.:250; IV Piggyback:440] Out: 1600 [Urine:1600] Intake/Output this shift: No intake/output data recorded.  General appearance: moderately obese and lethargic but arouses,does not move extrem Resp: diminished breath sounds bilaterally Chest wall: RIJ PC, RUA avf Cardio: S1, S2 normal and systolic murmur: systolic ejection 2/6, decrescendo at 2nd left intercostal space GI: soft, non-tender; bowel sounds normal; no masses,  no organomegaly Extremities: braces on hands and feet,  boots on feet, avf RUA  Lab Results: Recent Labs    06/05/17 0359  WBC 9.9  HGB 8.2*  HCT 24.6*  PLT 46*   BMET:  Recent Labs    06/04/17 0825 06/05/17 0359  NA 137 138  K 4.3 4.2  CL 99* 99*  CO2 23 26  GLUCOSE 187* 193*  BUN 33* 36*  CREATININE 2.88* 3.22*  CALCIUM 6.6* 6.9*   No results for input(s): PTH in the last 72 hours. Iron Studies: No results for input(s): IRON, TIBC, TRANSFERRIN, FERRITIN in the last 72 hours.  Studies/Results: Mr Brain Wo Contrast  Result Date: 06/04/2017 CLINICAL DATA:  Initial evaluation for acute encephalopathy. History of multiple sclerosis. EXAM: MRI HEAD WITHOUT CONTRAST TECHNIQUE: Multiplanar, multiecho pulse sequences of the brain and surrounding structures were obtained without intravenous contrast. COMPARISON:  Prior CT from earlier the same day is well as previous brain MRI from 03/23/2017. FINDINGS: Brain: Mildly advanced cerebral volume loss for age, stable from previous.  Again seen are multiple T2/FLAIR hyperintense foci involving the periventricular and deep white matter both cerebral hemispheres in a distribution and appearance most consistent with chronic demyelinating disease/multiple sclerosis. Lesions involving the left thalamus and left basal ganglia, with multiple infratentorial lesions involving the brainstem and bilateral cerebellar hemispheres again noted. There is faint diffusion abnormality seen about lesions involving the bilateral occipital lobes (series [id number], image 38) as well as the left thalamus (series [id number], image 59). Difficult to exclude a degree of mild active and/or subacute demyelination. No other significant diffusion abnormality or edema to suggest active demyelination identified. No evidence for acute infarct. Gray-white matter differentiation maintained. No acute intracranial hemorrhage. Few small chronic micro hemorrhages noted within the left cerebellar hemisphere, chronic in appearance, but new relative to most recent MRI from 03/23/2017. No mass lesion, mass effect, or midline shift. No hydrocephalus. No extra-axial fluid collection. Pituitary gland suprasellar region normal. Midline structures intact. Vascular: Major intracranial vascular flow voids maintained. Skull and upper cervical spine: Craniocervical junction within normal limits. Bone marrow signal intensity normal. No scalp soft tissue abnormality. Sinuses/Orbits: Globes and orbital soft tissues demonstrate no acute abnormality. Mild scattered mucosal thickening within the ethmoidal air cells. Paranasal sinuses are otherwise clear. No mastoid effusion. Inner ear structures normal. Other: None IMPRESSION: 1. Findings consistent with chronic demyelinating disease, similar in appearance in distribution relative to prior MRI. Mild diffusion abnormality about lesions within the bilateral occipital lobes and left thalamus may reflect mild acute versus subacute active demyelination changes. 2. No  other acute intracranial abnormality identified. 3. Mildly  advanced cerebral atrophy for age. Electronically Signed   By: Rise Mu M.D.   On: 06/04/2017 19:36    I have reviewed the patient's current medications.  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  Clearly intemittent obstruction 2 Anemia esa 3 HPTH 4 MS 5 Quad 6 Sz 7 Afib reg today 8 Decub 9Confusion P  Check chem , follow urine    LOS: 36 days   Selenia Mihok 06/06/2017,7:46 AM

## 2017-06-06 NOTE — Plan of Care (Signed)
  Problem: Health Behavior/Discharge Planning: Goal: Ability to manage health-related needs will improve Outcome: Progressing   Problem: Clinical Measurements: Goal: Ability to maintain clinical measurements within normal limits will improve Outcome: Progressing Goal: Will remain free from infection Outcome: Progressing Goal: Diagnostic test results will improve Outcome: Progressing Goal: Respiratory complications will improve Outcome: Progressing Goal: Cardiovascular complication will be avoided Outcome: Progressing   Problem: Activity: Goal: Risk for activity intolerance will decrease Outcome: Progressing   Problem: Nutrition: Goal: Adequate nutrition will be maintained Outcome: Progressing   Problem: Coping: Goal: Level of anxiety will decrease Outcome: Progressing   Problem: Elimination: Goal: Will not experience complications related to bowel motility Outcome: Progressing Goal: Will not experience complications related to urinary retention Outcome: Progressing   Problem: Skin Integrity: Goal: Risk for impaired skin integrity will decrease Outcome: Progressing   Problem: Clinical Measurements: Goal: Ability to maintain clinical measurements within normal limits will improve Outcome: Progressing   Problem: Skin Integrity: Goal: Risk for impaired skin integrity will decrease Outcome: Progressing   Problem: Education: Goal: Knowledge of disease and its progression will improve Outcome: Progressing   Problem: Health Behavior/Discharge Planning: Goal: Ability to manage health-related needs will improve Outcome: Progressing   Problem: Clinical Measurements: Goal: Complications related to the disease process or treatment will be avoided or minimized Outcome: Progressing Goal: Dialysis access will remain free of complications Outcome: Progressing   Problem: Activity: Goal: Activity intolerance will improve Outcome: Progressing   Problem: Fluid  Volume: Goal: Fluid volume balance will be maintained or improved Outcome: Progressing   Problem: Nutritional: Goal: Ability to make appropriate dietary choices will improve Outcome: Progressing   Problem: Respiratory: Goal: Respiratory symptoms related to disease process will be avoided Outcome: Progressing   Problem: Self-Concept: Goal: Body image disturbance will be avoided or minimized Outcome: Progressing   Problem: Urinary Elimination: Goal: Progression of disease will be identified and treated Outcome: Progressing

## 2017-06-06 NOTE — Progress Notes (Signed)
He continues to have mild lethargy, confusion.  On exam he is oriented but has difficulty with world backwards (D-L-O-R-W).  I suspect that he has mild delirium from his prolonged hospitalization, gram-negative UTI, glucocorticoids, potentially contributed to by his antibiotics.  I think that care for this to be primarily supportive, ensuring a good sleep-wake cycle, limiting sedating medications, treating his underlying metabolic/infectious issues.  It may be prudent to screen for comorbid issues that may contribute to cognitive decline such as B12 deficiency, hypothyroidism, or hyperammonemia.  If these are found, then the underlying medical issue can be addressed by primary team.  At this time, no further recommendations of the above, please call if further questions or concerns.  Ritta Slot, MD [facility] [phone]  If 7pm- 7am, please page neurology on call as listed in AMION.

## 2017-06-06 NOTE — Progress Notes (Signed)
PROGRESS NOTE  Dylan [name] WUJ:[id number] DOB: November 20, 1965 DOA: 2017-05-12 PCP: Patient, No Pcp Per   LOS: 36 days   Brief Narrative / Interim history: Patient is a 52 year old male with past medical history significant for recent quadriplegia, multiple sclerosis, decompression of the spinal abscess, spinal stenosis who has been  admitted for the management of acute kidney injury.  He was recently admitted to neurosurgery on 04/12/2017 and had surgery(corpectomy/decompression surgery) on 03/25/17 for cord compression due to spinal stenosis at C6 ,C5 and C7.  Postoperative course was completed with atrial fibrillation, started on Eliquis but discontinued due to spinal hematoma. Also epidural clot versus abscess was suspected on his follow up  C-spine MRI. Because of the high risk patient decided not to repeat exploratory surgery.  He was also started on IV antibiotics due to complicated UTI ,possible cervical abscess and was discharged to skilled nursing facility on 04/09/2017.   His Foley catheter was not draining at the skilled nursing facility so he was sent to the emergency department.  Found to have acute kidney injury. Urology was also consulted, flushed his Foley with removal of foreign body yeilding 2.5 L of urine. Currently patient has been managed for acute kidney injury thought to be secondary to possible vancomycin related.  Nephrology following.  Patient hospital course is also complicated with sepsis, Candida glabrata fungal anemia, progressive oliguric renal failure, hyperkalemia , thrombocytopenia. Patient underwent right-sided tunneled catheter placement for hemodialysis and right brachiocephalic AV fistula on 06/01/17. His disposition will be challenging .Currently he may not be able to sit for dialysis due to ulcers on his buttocks and quadriplegia.  He might need SNF with dialysis facility out of state.  Social worker following.    Assessment & Plan: Principal Problem:   AKI  (acute kidney injury) (HCC) Active Problems:   Uncontrolled diabetes mellitus type 2 without complications (HCC)   Hypertension   Morbid obesity (HCC)   Paroxysmal atrial fibrillation (HCC)   Multiple sclerosis exacerbation (HCC)   Pressure injury of skin   Hyperkalemia   Hematoma   Insomnia   Adjustment reaction with anxiety and depression   Quadriplegia (HCC)   History of fusion of cervical spine   Postoperative wound infection   Coronary artery disease   Atherosclerotic peripheral vascular disease (HCC)   MDD (major depressive disorder), single episode, moderate (HCC)   Candidemia (HCC)   Goals of care, counseling/discussion   Palliative care by specialist   Acute lower UTI   Acute renal failure: Thought to be secondary to obstructive uropathy versus vancomycin-induced renal injury.  Nephrology is following.  He has been started on dialysis since 05/07/2017. Permanent  dialysis catheter placed on 05/12/2017.  There is also concern that patient might need stretcher dialysis which would be possible only out of state. S/P right brachiocephalic AV fistula per vascular surgeon -Nephrology following, will wait to see if he needs further dialysis -16 French Foley with decreased output on 5/4, Foley exchanged for an 50 [place] on 5/4  Acute encephalopathy -also with a UTI. Treated based on sensitivities -Obtained an MRI of the brain which showed mild diffusion abnormality about lesions within the bilateral occipital lobes and left thalamus which may reflect mild acute versus subacute active demyelination changes -Appreciate neurology help, for now supportive treatment, no need for additional interventions but treat underlying illness  Severe thrombocytopenia: Thought to be secondary to drug related.  Anidulafungin, daptomycin and cefepime stopped.  Protonix also stopped.  HIT negative.  Patient does not  receive heparin with dialysis.  Hematology was also consulted .He has received several  units of platelets transfusion. Hematology feels that this is not likely immune medicine thrombocytopenia given patient response to transfusion.  -Dr. Clelia Croft following intermittently -Platelets overall stable, recheck CBC in the morning  GI bleed/acute blood loss anemia/acute on chronic anemia: Protonix stopped due to thrombocytopenia.  -Status post 2 units of PRBC few days ago.  No further bleeding noted. -GI was on board due to her large episode of hematochezia, self resolved.  Recommending continue close monitoring.  Recommended to reconsult if rebleeding.  No new episodes of hematochezia . -GI reluctant to do any kind of procedures unless his platelets are more than 50,000.  Elevated troponin: Mild.  Did not trend up.  Patient denies any chest pain.  Likely supply demand ischemia  Quadriplegia/cervical abscess versus cervical hematoma/infected cervical hematoma: Patient completed 6 weeks of antibiotics including 4 weeks of vancomycin and cefepime followed by 2 weeks of daptomycin and cefepime.  Course completed on 05/16/2017. Neurosurgery was following before.  ID was following,signed off.  Neurogenic bladder: Patient was discharged with Foley catheter on 04/09/2017 in the context of new quadriplegia. Foley catheter exhanged on admission. Foley was taken out during this admission but had to be put back again due to urinary retention.  Continue Foley catheter  UTI: Urine culture growing Ecoli, staph as well as Pseudomonas. Pseudomonas is resistant to Cefepime. Changed to Cipro, plan for total of 5 days, today is day 3  Candidemia: Patient had 10 days of  Anidulafungin which has been discontinued due to thrombocytopenia. Ophthalmology also consulted, no evidence of intraocular fungal infection.  Echocardiogram did not show any vegetation.  Diabetes type 2: Continue Lantus, sliding scale insulin.  We will continue monitor blood sugars. CBGs 160s today   Paroxysmal A. fib: No  anticoagulation given his current thrombocytopenia.  Continue amiodarone, metoprolol and Cardizem. -sinus on telemetry   Multiple sclerosis: Recently diagnosed on previous admission and he was given 5 days of high-dose steroids with improvement.  Neurology was consulted at that time.  -MRI obtained on 5/3 as described above, neurology was consulted on 5/4, appreciate input  Adjustment disorder/depression: Continue mirtazapine.  Psychiatry was following.  Recommended trazodone.  Hyponatremia/hypokalemia: Currently stable  Unstageable pressure injury on bilateral buttocks: Continue supportive care.  Continue wound care.  His pressure injury on bilateral buttocks is making him unable to sit for dialysis.  Nocturnal hypoxia: No history of OSA or the use of CPAP.  Continue supplemental oxygen as needed/CPAP at night.  Goals of care: Palliative care was consulted before.  Remains full code    DVT prophylaxis: SCD Code Status: Full code Family Communication: d/w extensive family members last night 5/2 Disposition Plan: Skilled nursing facility after improvement in thrombocytopenia , hematology evaluation, nephrology clearance and finding of skilled nursing facility with stretcher dialysis facility  Consultants:   Nephrology   Hematology   Neurology  Psychiatry   ID  Ophthalmology  GI  Procedures:  Echocardiogram, upper external vein mapping, HD  Antimicrobials:  Ciprofloxacin 5/2 >> Prior antibiotics include daptomycin, Eraxis, cefepime, vancomycin, Zosyn  Subjective: -Appears to be more alert, no complaints  Objective: Vitals:   06/05/17 1606 06/05/17 2001 06/05/17 2319 06/06/17 0358  BP: (!) 157/76 (!) 171/82 (!) 180/89 (!) 165/87  Pulse: 76 74 70 64  Resp: 20 20 20 20   Temp: 97.8 F (36.6 C) 98.2 F (36.8 C) (!) 97.5 F (36.4 C) (!) 97.5 F (36.4 C)  TempSrc: Oral Oral Oral Oral  SpO2: 100% 100% 100% 100%  Weight:    98.7 kg (217 lb 9.6 oz)  Height:         Intake/Output Summary (Last 24 hours) at 06/06/2017 0950 Last data filed at 06/06/2017 0700 Gross per 24 hour  Intake 690 ml  Output 1600 ml  Net -910 ml   Filed Weights   06/02/17 0029 06/06/17 0358  Weight: 127 kg (280 lb) 98.7 kg (217 lb 9.6 oz)    Examination:  Constitutional: NAD Respiratory: CTA Cardiovascular: RRR  Data Reviewed: I have independently reviewed following labs and imaging studies   CBC: Recent Labs  Lab 06/01/2017 0237 06/01/17 0923 06/02/17 0642 06/03/17 0500 06/05/17 0359  WBC 6.9 9.8 9.5 12.0* 9.9  NEUTROABS 5.5 8.8* 8.8*  --   --   HGB 7.9* 7.4* 7.9* 7.9* 8.2*  HCT 24.2* 21.9* 23.5* 23.7* 24.6*  MCV 87.1 88.0 86.7 87.1 87.9  PLT 43* 49* 38* 37* 46*   Basic Metabolic Panel: Recent Labs  Lab 06/01/17 0923 06/02/17 0642 06/03/17 0500 06/04/17 0825 06/05/17 0359  NA 137 139 138 137 138  K 4.9 4.9 3.6 4.3 4.2  CL 102 103 100* 99* 99*  CO2 21* 23 28 23 26   GLUCOSE 207* 190* 114* 187* 193*  BUN 50* 61* 24* 33* 36*  CREATININE 4.09* 4.41* 2.22* 2.88* 3.22*  CALCIUM 6.7* 6.6* 6.5* 6.6* 6.9*  PHOS 5.1*  --   --  3.9 4.3   GFR: Estimated Creatinine Clearance: 33.7 mL/min (A) (by C-G formula based on SCr of 3.22 mg/dL (H)). Liver Function Tests: Recent Labs  Lab 06/01/17 0923 06/04/17 0825 06/05/17 0359 06/05/17 0458  AST  --   --   --  23  ALT  --   --   --  56  ALKPHOS  --   --   --  115  BILITOT  --   --   --  1.1  PROT  --   --   --  5.3*  ALBUMIN 1.8* 1.8* 1.8* 1.8*   No results for input(s): LIPASE, AMYLASE in the last 168 hours. No results for input(s): AMMONIA in the last 168 hours. Coagulation Profile: Recent Labs  Lab 06/01/2017 0237  INR 1.31   Cardiac Enzymes: No results for input(s): CKTOTAL, CKMB, CKMBINDEX, TROPONINI in the last 168 hours. BNP (last 3 results) No results for input(s): PROBNP in the last 8760 hours. HbA1C: No results for input(s): HGBA1C in the last 72 hours. CBG: Recent Labs  Lab  06/05/17 0638 06/05/17 1140 06/05/17 1635 06/05/17 2118 06/06/17 0629  GLUCAP 176* 193* 278* 336* 252*   Lipid Profile: No results for input(s): CHOL, HDL, LDLCALC, TRIG, CHOLHDL, LDLDIRECT in the last 72 hours. Thyroid Function Tests: No results for input(s): TSH, T4TOTAL, FREET4, T3FREE, THYROIDAB in the last 72 hours. Anemia Panel: No results for input(s): VITAMINB12, FOLATE, FERRITIN, TIBC, IRON, RETICCTPCT in the last 72 hours. Urine analysis:    Component Value Date/Time   COLORURINE YELLOW 05/29/2017 0340   APPEARANCEUR CLOUDY (A) 05/29/2017 0340   LABSPEC 1.009 05/29/2017 0340   PHURINE 6.0 05/29/2017 0340   GLUCOSEU NEGATIVE 05/29/2017 0340   HGBUR MODERATE (A) 05/29/2017 0340   BILIRUBINUR NEGATIVE 05/29/2017 0340   KETONESUR NEGATIVE 05/29/2017 0340   PROTEINUR 100 (A) 05/29/2017 0340   NITRITE NEGATIVE 05/29/2017 0340   LEUKOCYTESUR LARGE (A) 05/29/2017 0340   Sepsis Labs: Invalid input(s): PROCALCITONIN, LACTICIDVEN  Recent Results (from the past  240 hour(s))  Culture, Urine     Status: Abnormal   Collection Time: 05/29/17  3:38 AM  Result Value Ref Range Status   Specimen Description URINE, CATHETERIZED  Final   Special Requests   Final    NONE Performed at [facility], [address], [place], [place] [address]    Culture (A)  Final    >=100,000 COLONIES/mL ESCHERICHIA COLI >=100,000 COLONIES/mL PSEUDOMONAS AERUGINOSA 20,000 COLONIES/mL STAPHYLOCOCCUS AUREUS    Report Status 06/03/2017 FINAL  Final   Organism ID, Bacteria ESCHERICHIA COLI (A)  Final   Organism ID, Bacteria PSEUDOMONAS AERUGINOSA (A)  Final   Organism ID, Bacteria STAPHYLOCOCCUS AUREUS (A)  Final      Susceptibility   Escherichia coli - MIC*    AMPICILLIN >=32 RESISTANT Resistant     CEFAZOLIN <=4 SENSITIVE Sensitive     CEFTRIAXONE <=1 SENSITIVE Sensitive     CIPROFLOXACIN <=0.25 SENSITIVE Sensitive     GENTAMICIN <=1 SENSITIVE Sensitive     IMIPENEM <=0.25 SENSITIVE  Sensitive     NITROFURANTOIN <=16 SENSITIVE Sensitive     TRIMETH/SULFA <=20 SENSITIVE Sensitive     AMPICILLIN/SULBACTAM 4 SENSITIVE Sensitive     PIP/TAZO <=4 SENSITIVE Sensitive     * >=100,000 COLONIES/mL ESCHERICHIA COLI   Pseudomonas aeruginosa - MIC*    CEFTAZIDIME >=64 RESISTANT Resistant     CIPROFLOXACIN <=0.25 SENSITIVE Sensitive     GENTAMICIN 4 SENSITIVE Sensitive     IMIPENEM 2 SENSITIVE Sensitive     CEFEPIME 32 RESISTANT Resistant     * >=100,000 COLONIES/mL PSEUDOMONAS AERUGINOSA   Staphylococcus aureus - MIC*    CIPROFLOXACIN <=0.5 SENSITIVE Sensitive     GENTAMICIN <=0.5 SENSITIVE Sensitive     NITROFURANTOIN <=16 SENSITIVE Sensitive     OXACILLIN <=0.25 SENSITIVE Sensitive     TETRACYCLINE <=1 SENSITIVE Sensitive     VANCOMYCIN 1 SENSITIVE Sensitive     TRIMETH/SULFA <=10 SENSITIVE Sensitive     CLINDAMYCIN <=0.25 SENSITIVE Sensitive     RIFAMPIN <=0.5 SENSITIVE Sensitive     Inducible Clindamycin NEGATIVE Sensitive     * 20,000 COLONIES/mL STAPHYLOCOCCUS AUREUS      Radiology Studies: Mr Brain 63 Contrast  Result Date: 06/04/2017 CLINICAL DATA:  Initial evaluation for acute encephalopathy. History of multiple sclerosis. EXAM: MRI HEAD WITHOUT CONTRAST TECHNIQUE: Multiplanar, multiecho pulse sequences of the brain and surrounding structures were obtained without intravenous contrast. COMPARISON:  Prior CT from earlier the same day is well as previous brain MRI from 03/23/2017. FINDINGS: Brain: Mildly advanced cerebral volume loss for age, stable from previous. Again seen are multiple T2/FLAIR hyperintense foci involving the periventricular and deep white matter both cerebral hemispheres in a distribution and appearance most consistent with chronic demyelinating disease/multiple sclerosis. Lesions involving the left thalamus and left basal ganglia, with multiple infratentorial lesions involving the brainstem and bilateral cerebellar hemispheres again noted. There is  faint diffusion abnormality seen about lesions involving the bilateral occipital lobes (series [id number], image 38) as well as the left thalamus (series 5001, image 59). Difficult to exclude a degree of mild active and/or subacute demyelination. No other significant diffusion abnormality or edema to suggest active demyelination identified. No evidence for acute infarct. Gray-white matter differentiation maintained. No acute intracranial hemorrhage. Few small chronic micro hemorrhages noted within the left cerebellar hemisphere, chronic in appearance, but new relative to most recent MRI from 03/23/2017. No mass lesion, mass effect, or midline shift. No hydrocephalus. No extra-axial fluid collection. Pituitary gland suprasellar region  normal. Midline structures intact. Vascular: Major intracranial vascular flow voids maintained. Skull and upper cervical spine: Craniocervical junction within normal limits. Bone marrow signal intensity normal. No scalp soft tissue abnormality. Sinuses/Orbits: Globes and orbital soft tissues demonstrate no acute abnormality. Mild scattered mucosal thickening within the ethmoidal air cells. Paranasal sinuses are otherwise clear. No mastoid effusion. Inner ear structures normal. Other: None IMPRESSION: 1. Findings consistent with chronic demyelinating disease, similar in appearance in distribution relative to prior MRI. Mild diffusion abnormality about lesions within the bilateral occipital lobes and left thalamus may reflect mild acute versus subacute active demyelination changes. 2. No other acute intracranial abnormality identified. 3. Mildly advanced cerebral atrophy for age. Electronically Signed   By: Rise Mu M.D.   On: 06/04/2017 19:36     Scheduled Meds: . amiodarone  200 mg Oral Daily  . ciprofloxacin  500 mg Oral Daily  . collagenase   Topical Daily  . darbepoetin (ARANESP) injection - DIALYSIS  200 mcg Intravenous Q Wed-HD  . dexamethasone  3 mg Oral Q8H  .  diltiazem  120 mg Oral QHS  . feeding supplement (ENSURE ENLIVE)  237 mL Oral BID BM  . guaiFENesin  600 mg Oral BID  . insulin aspart  0-5 Units Subcutaneous QHS  . insulin aspart  0-9 Units Subcutaneous TID WC  . insulin aspart  2 Units Subcutaneous TID WC  . insulin glargine  12 Units Subcutaneous Daily  . metoprolol tartrate  12.5 mg Oral BID  . mirtazapine  30 mg Oral QHS  . multivitamin  1 tablet Oral QHS   Continuous Infusions: . sodium chloride    . sodium chloride    . sodium chloride    . ferric gluconate (FERRLECIT/NULECIT) IV Stopped (06/05/17 1237)    Pamella Pert, MD, PhD [facility] Pager [phone]  If 7PM-7AM, please contact night-coverage [email or web address] Password [email or web address] 06/06/2017, 9:50 AM

## 2017-06-06 NOTE — Progress Notes (Signed)
Occupational Therapy Treatment Patient Details Name: Dylan [name] MRN: [id number] DOB: 1966/01/24 Today's Date: 06/06/2017    History of present illness Dylan [name] is a 52yo black male who comes to [facility] on 3/30 with ABD pain and hypoglycemia, admitted with cervical abcess v hematoma s/p C five-seven surgery on . 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 He DC to SNF ~3WA with motor weakness C7 down, full sensation intact. He reports graual worsening of manipulative use with smartphone over those three weaks.  underwent right-sided tunneled catheter placement for hemodialysis and right brachiocephalic AV fistula on 06/01/17   OT comments  Pt initially lelargic, then increased level of arousal as session progressed. Pt demonstrates cognitive impairment with orientation to time, slow processing and problem solving, Pt gradually improving strength, especially in RUE. Developing a L elbow flexion contracture. Pt continues to increase his ability to feed self, especailly finger foods.   Encourage activities to reduce delirium, Place pt in  "chair position" in bed with BLE SCDs attached and operating BID, use L wrist splint as needed during the day, use B resting hand splints at night and B Prevalon boots on while in bed.  Continue to recommend SNF for rehab.   Follow Up Recommendations  SNF;Supervision/Assistance - 24 hour    Equipment Recommendations  Wheelchair (measurements OT);Wheelchair cushion (measurements OT)    Recommendations for Other Services      Precautions / Restrictions Precautions Precautions: Fall;Cervical;Other (comment) Precaution Comments: watch BP; HD catheter RUE; sacral wounds Required Braces or Orthoses: Other Brace/Splint Other Brace/Splint: B resting hand splints for night use only; B WHO to use PRN during the  day       Mobility Bed Mobility                  Transfers                      Balance     Sitting balance-Leahy Scale: Zero Sitting balance - Comments: illows used as lateral supports in bed                                   ADL either performed or assessed with clinical judgement   ADL   Eating/Feeding: Moderate assistance;With adaptive utensils Eating/Feeding Details (indicate cue type and reason): Pt using tenodesis grasp to feed self finger foods; Cup looped around thumb to assist with drinking fluids after set up.       May need to DC R WHO given increase in strength - will further assess                             General ADL Comments: Bed moved into chair position. Air mattress on.     Vision       Perception     Praxis      Cognition Arousal/Alertness: Awake/alert Behavior During Therapy: WFL for tasks assessed/performed Overall Cognitive Status: Impaired/Different from baseline Area of Impairment: Orientation;Attention;Memory;Awareness;Problem solving                 Orientation Level: Disoriented to;Time Current Attention Level: Selective Memory: Decreased short-term memory Following Commands: Follows one step commands consistently   Awareness: Emergent Problem Solving: Slow processing;Difficulty sequencing;Requires  verbal cues;Requires tactile cues          Exercises General Exercises - Upper Extremity Shoulder Flexion: AAROM;Both;15 reps;Supine Shoulder Extension: AAROM;Both;15 reps;Supine Shoulder ABduction: Both;10 reps Shoulder ADduction: AAROM;Both;15 reps;Supine Shoulder Horizontal ABduction: Both;10 reps Shoulder Horizontal ADduction: Both;10 reps Elbow Flexion: Both;Strengthening;Supine;5 reps Elbow Extension: AAROM;Both;Supine;5 reps Wrist Flexion: AROM;15 reps;Supine;Both Wrist Extension: Left;PROM;10 reps;Right;Strengthening;15 reps Digit Composite Flexion: PROM;Both;10  reps;Supine Composite Extension: PROM;Both;10 reps;Supine Other Exercises Other Exercises: scapular protraction/retraction; elevation/depression Other Exercises: hand to mouth pattern RUE x 5 Other Exercises: gentle passive stretch of L elbow into extension after gentle soft tissue work @ elbow. Able to achieve extension -20-30 degrees L gentile shoulder passive stretching into ER - tight capsule   Shoulder Instructions       General Comments  increased strength overall RUE, wrist extensioni 3+/5 L elbow flexion contracture developing. L shoulder appears stronger (2/5)    Pertinent Vitals/ Pain       Pain Assessment: Faces Faces Pain Scale: Hurts little more Pain Location: with BUE P/AAROM, especially L elbow Pain Descriptors / Indicators: Grimacing;Guarding Pain Intervention(s): Limited activity within patient's tolerance;Relaxation;Repositioned  Home Living                                          Prior Functioning/Environment              Frequency  Min 3X/week        Progress Toward Goals  OT Goals(current goals can now be found in the care plan section)  Progress towards OT goals: Progressing toward goals  Acute Rehab OT Goals Patient Stated Goal: to get better OT Goal Formulation: With patient Time For Goal Achievement: 06/10/17 Potential to Achieve Goals: Fair ADL Goals Pt Will Perform Eating: with mod assist;with adaptive utensils;sitting Pt Will Perform Grooming: with mod assist;sitting;with adaptive equipment Pt/caregiver will Perform Home Exercise Program: Increased ROM;Increased strength;Both right and left upper extremity;With minimal assist Additional ADL Goal #1: Pt will demonstrate ability to self-direct personal care during ADL/mobility completion. Additional ADL Goal #2: Pt will independently direct caregiver on donning/doffing WHO and purpose of splints. Additional ADL Goal #3: Pt will tolerate B resting hand splints to  improve functional positioning of hands without complications  Plan Discharge plan remains appropriate    Co-evaluation                 AM-PAC PT "6 Clicks" Daily Activity     Outcome Measure   Help from another person eating meals?: A Lot Help from another person taking care of personal grooming?: A Lot Help from another person toileting, which includes using toliet, bedpan, or urinal?: Total Help from another person bathing (including washing, rinsing, drying)?: Total Help from another person to put on and taking off regular upper body clothing?: Total Help from another person to put on and taking off regular lower body clothing?: Total 6 Click Score: 8    End of Session Equipment Utilized During Treatment: Oxygen  OT Visit Diagnosis: Muscle weakness (generalized) (M62.81);Other symptoms and signs involving the nervous system (R29.898)   Activity Tolerance Patient tolerated treatment well   Patient Left in bed;with call bell/phone within reach;with SCD's reapplied(chair position)   Nurse Communication Other (comment)(proper use of splints; management of vital lift bed )        Time: 1610-9604 OT Time Calculation (min): 39 min  Charges: OT General Charges $OT Visit:  1 Visit OT Treatments $Self Care/Home Management : 23-37 mins $Therapeutic Activity: 8-22 mins  [facility], OT/L  [phone] 06/06/2017   Charlette Hennings,HILLARY 06/06/2017, 3:15 PM

## 2017-06-06 NOTE — Progress Notes (Signed)
Overnight, 16 Fr Foley changed to 18 Fr Foley d/t retention. Pt tolerated procedure well. Yellow cloudy urine drained into new drainage bag. Bladder scan shows 45mL retained this am. Will continue to monitor.

## 2017-06-06 NOTE — Progress Notes (Signed)
Patient is refusing the use of CPAP for tonight. RT informed patient to have RN contact RT if he changes his mind. 

## 2017-06-07 ENCOUNTER — Inpatient Hospital Stay (HOSPITAL_COMMUNITY): Payer: Medicaid Other

## 2017-06-07 LAB — CBC
HCT: 25 % — ABNORMAL LOW (ref 39.0–52.0)
HEMOGLOBIN: 8.4 g/dL — AB (ref 13.0–17.0)
MCH: 29.3 pg (ref 26.0–34.0)
MCHC: 33.6 g/dL (ref 30.0–36.0)
MCV: 87.1 fL (ref 78.0–100.0)
Platelets: 34 10*3/uL — ABNORMAL LOW (ref 150–400)
RBC: 2.87 MIL/uL — AB (ref 4.22–5.81)
RDW: 16 % — ABNORMAL HIGH (ref 11.5–15.5)
WBC: 18.6 10*3/uL — ABNORMAL HIGH (ref 4.0–10.5)

## 2017-06-07 LAB — RENAL FUNCTION PANEL
ALBUMIN: 1.8 g/dL — AB (ref 3.5–5.0)
ANION GAP: 13 (ref 5–15)
BUN: 54 mg/dL — ABNORMAL HIGH (ref 6–20)
CALCIUM: 7.1 mg/dL — AB (ref 8.9–10.3)
CO2: 23 mmol/L (ref 22–32)
Chloride: 103 mmol/L (ref 101–111)
Creatinine, Ser: 3.57 mg/dL — ABNORMAL HIGH (ref 0.61–1.24)
GFR calc Af Amer: 21 mL/min — ABNORMAL LOW (ref >60)
GFR calc non Af Amer: 18 mL/min — ABNORMAL LOW (ref >60)
GLUCOSE: 83 mg/dL (ref 65–99)
PHOSPHORUS: 3.9 mg/dL (ref 2.5–4.6)
POTASSIUM: 4.5 mmol/L (ref 3.5–5.1)
SODIUM: 139 mmol/L (ref 135–145)

## 2017-06-07 LAB — GLUCOSE, CAPILLARY
GLUCOSE-CAPILLARY: 110 mg/dL — AB (ref 65–99)
Glucose-Capillary: 151 mg/dL — ABNORMAL HIGH (ref 65–99)
Glucose-Capillary: 146 mg/dL — ABNORMAL HIGH (ref 65–99)
Glucose-Capillary: 167 mg/dL — ABNORMAL HIGH (ref 65–99)

## 2017-06-07 MED ORDER — ENSURE ENLIVE PO LIQD
237.0000 mL | Freq: Three times a day (TID) | ORAL | Status: DC
  Administered 2017-06-07 – 2017-06-10 (×7): 237 mL via ORAL

## 2017-06-07 NOTE — Progress Notes (Signed)
Nutrition Follow-up  DOCUMENTATION CODES:   Obesity unspecified  INTERVENTION:  Increase Ensure Enlive po to TID, each supplement provides 350 kcal and 20 grams of protein  Recommend liberalize diet to: 2 Gram Sodium/Carb Modified, Fluid restrition  NUTRITION DIAGNOSIS:   Inadequate oral intake related to poor appetite as evidenced by per patient/family report. -ongoing  GOAL:   Patient will meet greater than or equal to 90% of their needs -unmet  MONITOR:   PO intake, Supplement acceptance, Weight trends, I & O's, Labs  ASSESSMENT:   Pt with PMH of HTN, DM, MS, recent quadriplegia, s/p decompression spinal surgery on C5, C6 and C7 (03/25/17) developing multiple issues post op including new Afib and prevertebral hematoma at surgical sight. Pt presents with abdominal pain found AKI  Spoke with patient at bedside.  He complains of decreased appetite, but also does not like the food choices available to him. During visit he was sitting up in bed, eating cookies. Ate 1/2 of a sandwich today for lunch. Also drinking ensure (sometimes) per patient. Encouraged him to eat as much as he can to help maintain weight and muscle. Also discussed possibility of liberalizing diet as was done previously during admission to help him have more options and better PO intake.  Patient likes "savory" snacks. Ordered for peanut butter and crackers and [place] rand pretzels for patient. Paged MD about this.  Weight down to 217 pounds today from 280 pounds 5/01 - question accuracy. No estimated dry weight noted.  Labs reviewed:  CBGs 151, 110  Medications reviewed and include:  Insulin, Rena-Vit, Remeron  Diet Order:   Diet Order           Diet renal/carb modified with fluid restriction Fluid restriction: 1200 mL Fluid; Room service appropriate? Yes; Fluid consistency: Thin  Diet effective now          EDUCATION NEEDS:   Not appropriate for education at this time  Skin:  Skin  Assessment: Skin Integrity Issues: Skin Integrity Issues:: Incisions Stage II: buttocks Unstageable: to buttocks Incisions: closed to R arm and R Neck  Last BM:  06/04/2017  Height:   Ht Readings from Last 1 Encounters:  05/05/17 6\' 4"  (1.93 m)    Weight:   Wt Readings from Last 1 Encounters:  06/06/17 217 lb 9.6 oz (98.7 kg)    Ideal Body Weight:  91.8 kg  BMI:  Body mass index is 26.49 kg/m.  Estimated Nutritional Needs:   Kcal:  2200-2400  Protein:  120-135 grams  Fluid:  UOP +1L  Dionne Ano. Teara Duerksen, MS, RD LDN Inpatient Clinical Dietitian Pager [phone]

## 2017-06-07 NOTE — Progress Notes (Signed)
PROGRESS NOTE  Dylan [name] ZOX:[id number] DOB: 1965-04-18 DOA: 04/30/2017 PCP: Patient, No Pcp Per   LOS: 37 days   Brief Narrative / Interim history: Patient is a 52 year old male with past medical history significant for recent quadriplegia, multiple sclerosis, decompression of the spinal abscess, spinal stenosis who has been  admitted for the management of acute kidney injury.  He was recently admitted to neurosurgery on 04/12/2017 and had surgery(corpectomy/decompression surgery) on 03/25/17 for cord compression due to spinal stenosis at C6 ,C5 and C7.  Postoperative course was completed with atrial fibrillation, started on Eliquis but discontinued due to spinal hematoma. Also epidural clot versus abscess was suspected on his follow up  C-spine MRI. Because of the high risk patient decided not to repeat exploratory surgery.  He was also started on IV antibiotics due to complicated UTI ,possible cervical abscess and was discharged to skilled nursing facility on 04/09/2017.   His Foley catheter was not draining at the skilled nursing facility so he was sent to the emergency department.  Found to have acute kidney injury. Urology was also consulted, flushed his Foley with removal of foreign body yeilding 2.5 L of urine. Currently patient has been managed for acute kidney injury thought to be secondary to possible vancomycin related.  Nephrology following.  Patient hospital course is also complicated with sepsis, Candida glabrata fungal anemia, progressive oliguric renal failure, hyperkalemia , thrombocytopenia. Patient underwent right-sided tunneled catheter placement for hemodialysis and right brachiocephalic AV fistula on 06/01/17. His disposition will be challenging .Currently he may not be able to sit for dialysis due to ulcers on his buttocks and quadriplegia.  He might need SNF with dialysis facility out of state.  Social worker following.  Assessment & Plan: Principal Problem:   AKI (acute  kidney injury) (HCC) Active Problems:   Uncontrolled diabetes mellitus type 2 without complications (HCC)   Hypertension   Morbid obesity (HCC)   Paroxysmal atrial fibrillation (HCC)   Multiple sclerosis exacerbation (HCC)   Pressure injury of skin   Hyperkalemia   Hematoma   Insomnia   Adjustment reaction with anxiety and depression   Quadriplegia (HCC)   History of fusion of cervical spine   Postoperative wound infection   Coronary artery disease   Atherosclerotic peripheral vascular disease (HCC)   MDD (major depressive disorder), single episode, moderate (HCC)   Candidemia (HCC)   Goals of care, counseling/discussion   Palliative care by specialist   Acute lower UTI   Acute renal failure / BOO Thought to be secondary to obstructive uropathy versus vancomycin-induced renal injury.  Nephrology is following.  He has been started on dialysis since 05/07/2017. Permanent  dialysis catheter placed on 06-02-17.  There is also concern that patient might need stretcher dialysis which would be possible only out of state. S/P right brachiocephalic AV fistula per vascular surgeon -Nephrology following, will wait to see if he needs further dialysis -16 French Foley with decreased output on 5/4, Foley exchanged for an 9 [place] on 5/4 and 20 Fr on 5/5. Still with poor UOP, seems to be retaining based on bladder scan. -consulted urology today, appreciate input   Acute encephalopathy -also with a UTI. Treated based on sensitivities -Obtained an MRI of the brain which showed mild diffusion abnormality about lesions within the bilateral occipital lobes and left thalamus which may reflect mild acute versus subacute active demyelination changes -Appreciate neurology help, for now supportive treatment, no need for additional interventions but treat underlying illness  Severe thrombocytopenia: Thought  to be secondary to drug related.  Anidulafungin, daptomycin and cefepime stopped.  Protonix also  stopped.  HIT negative.  Patient does not receive heparin with dialysis.  Hematology was also consulted .He has received several units of platelets transfusion. Hematology feels that this is not likely immune medicine thrombocytopenia given patient response to transfusion.  -Dr. Clelia Croft following intermittently -plt 34 this morning, overall stable, no evidence of bleeding   GI bleed/acute blood loss anemia/acute on chronic anemia: Protonix stopped due to thrombocytopenia.  -Status post 2 units of PRBC few days ago.  No further bleeding noted. -GI was on board due to her large episode of hematochezia, self resolved.  Recommending continue close monitoring.  Recommended to reconsult if rebleeding.  No new episodes of hematochezia . -GI reluctant to do any kind of procedures unless his platelets are more than 50,000.  Elevated troponin: Mild.  Did not trend up.  Patient denies any chest pain.  Likely supply demand ischemia  Quadriplegia/cervical abscess versus cervical hematoma/infected cervical hematoma: Patient completed 6 weeks of antibiotics including 4 weeks of vancomycin and cefepime followed by 2 weeks of daptomycin and cefepime.  Course completed on 05/16/2017. Neurosurgery was following before.  ID was following,signed off.  Neurogenic bladder: Patient was discharged with Foley catheter on 04/09/2017 in the context of new quadriplegia. Foley catheter exhanged on admission. Foley was taken out during this admission but had to be put back again due to urinary retention.  Continue Foley catheter  UTI: Urine culture growing Ecoli, staph as well as Pseudomonas. Pseudomonas is resistant to Cefepime. Changed to Cipro, plan for total of 5 days, today is day 4 -Maintain antibiotics for now, WBC increasing today to 18. Afebrile and clinically stable   Candidemia: Patient had 10 days of  Anidulafungin which has been discontinued due to thrombocytopenia. Ophthalmology also consulted, no evidence of  intraocular fungal infection.  Echocardiogram did not show any vegetation.  Diabetes type 2: Continue Lantus, sliding scale insulin.  We will continue monitor blood sugars.  -CBGs 80-110s, keep on current regimen   Paroxysmal A. fib: No anticoagulation given his current thrombocytopenia.  Continue amiodarone, metoprolol and Cardizem. -sinus on telemetry   Multiple sclerosis: Recently diagnosed on previous admission and he was given 5 days of high-dose steroids with improvement.  Neurology was consulted at that time.  -MRI obtained on 5/3 as described above, neurology was consulted on 5/4, appreciate input  Adjustment disorder/depression: Continue mirtazapine.  Psychiatry was following.  Recommended trazodone.  Hyponatremia/hypokalemia: Currently stable  Unstageable pressure injury on bilateral buttocks: Continue supportive care.  Continue wound care.  His pressure injury on bilateral buttocks is making him unable to sit for dialysis.  Nocturnal hypoxia: No history of OSA or the use of CPAP.  Continue supplemental oxygen as needed/CPAP at night.  Goals of care: Palliative care was consulted before.  Remains full code    DVT prophylaxis: SCD Code Status: Full code Family Communication: d/w extensive family members last night 5/2 Disposition Plan: Skilled nursing facility after nephrology clearance and finding of skilled nursing facility with stretcher dialysis facility  Consultants:   Nephrology   Hematology   Neurology  Psychiatry   ID  Ophthalmology  GI  Procedures:  Echocardiogram, upper external vein mapping, HD  Antimicrobials:  Ciprofloxacin 5/2 >> Prior antibiotics include daptomycin, Eraxis, cefepime, vancomycin, Zosyn  Subjective: -more alert today, pleasant, no complaints  Objective: Vitals:   06/06/17 1957 06/06/17 2355 06/07/17 0352 06/07/17 0844  BP: (!) 167/87 (!) 158/86 Marland Kitchen)  164/90   Pulse: 66 63 66   Resp: 18 18 20    Temp:  97.8 F (36.6 C)     TempSrc:  Oral    SpO2: 100% 100% 100% 100%  Weight:      Height:        Intake/Output Summary (Last 24 hours) at 06/07/2017 1016 Last data filed at 06/07/2017 0749 Gross per 24 hour  Intake 580 ml  Output 600 ml  Net -20 ml   Filed Weights   06/02/17 0029 06/06/17 0358  Weight: 127 kg (280 lb) 98.7 kg (217 lb 9.6 oz)    Examination:  Constitutional: NAD, calm, comfortable Eyes: PERRL, lids and conjunctivae normal Respiratory: clear to auscultation bilaterally, no wheezing, no crackles. Normal respiratory effort.  Cardiovascular: Regular rate and rhythm, no murmurs / rubs / gallops. No LE edema. 2+ pedal pulses.  Abdomen: no tenderness. Bowel sounds positive.  Skin: no rashes, lesions, ulcers. No induration Neurologic: quadriplegic   Data Reviewed: I have independently reviewed following labs and imaging studies   CBC: Recent Labs  Lab 06/01/17 0923 06/02/17 0642 06/03/17 0500 06/05/17 0359 06/07/17 0309  WBC 9.8 9.5 12.0* 9.9 18.6*  NEUTROABS 8.8* 8.8*  --   --   --   HGB 7.4* 7.9* 7.9* 8.2* 8.4*  HCT 21.9* 23.5* 23.7* 24.6* 25.0*  MCV 88.0 86.7 87.1 87.9 87.1  PLT 49* 38* 37* 46* 34*   Basic Metabolic Panel: Recent Labs  Lab 06/01/17 0923  06/03/17 0500 06/04/17 0825 06/05/17 0359 06/06/17 0943 06/07/17 0303  NA 137   < > 138 137 138 139 139  K 4.9   < > 3.6 4.3 4.2 5.0 4.5  CL 102   < > 100* 99* 99* 103 103  CO2 21*   < > 28 23 26 24 23   GLUCOSE 207*   < > 114* 187* 193* 241* 83  BUN 50*   < > 24* 33* 36* 48* 54*  CREATININE 4.09*   < > 2.22* 2.88* 3.22* 3.48* 3.57*  CALCIUM 6.7*   < > 6.5* 6.6* 6.9* 7.0* 7.1*  PHOS 5.1*  --   --  3.9 4.3 4.2 3.9   < > = values in this interval not displayed.   GFR: Estimated Creatinine Clearance: 30.4 mL/min (A) (by C-G formula based on SCr of 3.57 mg/dL (H)). Liver Function Tests: Recent Labs  Lab 06/04/17 0825 06/05/17 0359 06/05/17 0458 06/06/17 0943 06/07/17 0303  AST  --   --  23  --   --   ALT  --    --  56  --   --   ALKPHOS  --   --  115  --   --   BILITOT  --   --  1.1  --   --   PROT  --   --  5.3*  --   --   ALBUMIN 1.8* 1.8* 1.8* 1.8* 1.8*   No results for input(s): LIPASE, AMYLASE in the last 168 hours. Recent Labs  Lab 06/06/17 1454  AMMONIA 29   Coagulation Profile: No results for input(s): INR, PROTIME in the last 168 hours. Cardiac Enzymes: No results for input(s): CKTOTAL, CKMB, CKMBINDEX, TROPONINI in the last 168 hours. BNP (last 3 results) No results for input(s): PROBNP in the last 8760 hours. HbA1C: No results for input(s): HGBA1C in the last 72 hours. CBG: Recent Labs  Lab 06/06/17 0629 06/06/17 1201 06/06/17 1608 06/06/17 2116 06/07/17 0636  GLUCAP 252* 259*  250* 111* 110*   Lipid Profile: No results for input(s): CHOL, HDL, LDLCALC, TRIG, CHOLHDL, LDLDIRECT in the last 72 hours. Thyroid Function Tests: Recent Labs    06/06/17 1454  TSH 1.456   Anemia Panel: Recent Labs    06/06/17 1454  VITAMINB12 944*   Urine analysis:    Component Value Date/Time   COLORURINE YELLOW 05/29/2017 0340   APPEARANCEUR CLOUDY (A) 05/29/2017 0340   LABSPEC 1.009 05/29/2017 0340   PHURINE 6.0 05/29/2017 0340   GLUCOSEU NEGATIVE 05/29/2017 0340   HGBUR MODERATE (A) 05/29/2017 0340   BILIRUBINUR NEGATIVE 05/29/2017 0340   KETONESUR NEGATIVE 05/29/2017 0340   PROTEINUR 100 (A) 05/29/2017 0340   NITRITE NEGATIVE 05/29/2017 0340   LEUKOCYTESUR LARGE (A) 05/29/2017 0340   Sepsis Labs: Invalid input(s): PROCALCITONIN, LACTICIDVEN  Recent Results (from the past 240 hour(s))  Culture, Urine     Status: Abnormal   Collection Time: 05/29/17  3:38 AM  Result Value Ref Range Status   Specimen Description URINE, CATHETERIZED  Final   Special Requests   Final    NONE Performed at [facility], [address] [place], [place] [address]    Culture (A)  Final    >=100,000 COLONIES/mL ESCHERICHIA COLI >=100,000 COLONIES/mL PSEUDOMONAS AERUGINOSA 20,000  COLONIES/mL STAPHYLOCOCCUS AUREUS    Report Status 06/03/2017 FINAL  Final   Organism ID, Bacteria ESCHERICHIA COLI (A)  Final   Organism ID, Bacteria PSEUDOMONAS AERUGINOSA (A)  Final   Organism ID, Bacteria STAPHYLOCOCCUS AUREUS (A)  Final      Susceptibility   Escherichia coli - MIC*    AMPICILLIN >=32 RESISTANT Resistant     CEFAZOLIN <=4 SENSITIVE Sensitive     CEFTRIAXONE <=1 SENSITIVE Sensitive     CIPROFLOXACIN <=0.25 SENSITIVE Sensitive     GENTAMICIN <=1 SENSITIVE Sensitive     IMIPENEM <=0.25 SENSITIVE Sensitive     NITROFURANTOIN <=16 SENSITIVE Sensitive     TRIMETH/SULFA <=20 SENSITIVE Sensitive     AMPICILLIN/SULBACTAM 4 SENSITIVE Sensitive     PIP/TAZO <=4 SENSITIVE Sensitive     * >=100,000 COLONIES/mL ESCHERICHIA COLI   Pseudomonas aeruginosa - MIC*    CEFTAZIDIME >=64 RESISTANT Resistant     CIPROFLOXACIN <=0.25 SENSITIVE Sensitive     GENTAMICIN 4 SENSITIVE Sensitive     IMIPENEM 2 SENSITIVE Sensitive     CEFEPIME 32 RESISTANT Resistant     * >=100,000 COLONIES/mL PSEUDOMONAS AERUGINOSA   Staphylococcus aureus - MIC*    CIPROFLOXACIN <=0.5 SENSITIVE Sensitive     GENTAMICIN <=0.5 SENSITIVE Sensitive     NITROFURANTOIN <=16 SENSITIVE Sensitive     OXACILLIN <=0.25 SENSITIVE Sensitive     TETRACYCLINE <=1 SENSITIVE Sensitive     VANCOMYCIN 1 SENSITIVE Sensitive     TRIMETH/SULFA <=10 SENSITIVE Sensitive     CLINDAMYCIN <=0.25 SENSITIVE Sensitive     RIFAMPIN <=0.5 SENSITIVE Sensitive     Inducible Clindamycin NEGATIVE Sensitive     * 20,000 COLONIES/mL STAPHYLOCOCCUS AUREUS      Radiology Studies: No results found.   Scheduled Meds: . amiodarone  200 mg Oral Daily  . amLODipine  5 mg Oral Daily  . ciprofloxacin  500 mg Oral Daily  . collagenase   Topical Daily  . darbepoetin (ARANESP) injection - DIALYSIS  200 mcg Intravenous Q Wed-HD  . dexamethasone  3 mg Oral Q12H  . diltiazem  120 mg Oral QHS  . feeding supplement (ENSURE ENLIVE)  237 mL Oral  BID BM  . guaiFENesin  600 mg Oral BID  . insulin aspart  0-5 Units Subcutaneous QHS  . insulin aspart  0-9 Units Subcutaneous TID WC  . insulin aspart  2 Units Subcutaneous TID WC  . insulin glargine  12 Units Subcutaneous Daily  . metoprolol tartrate  12.5 mg Oral BID  . mirtazapine  30 mg Oral QHS  . multivitamin  1 tablet Oral QHS   Continuous Infusions: . sodium chloride    . sodium chloride    . sodium chloride    . ferric gluconate (FERRLECIT/NULECIT) IV Stopped (06/06/17 1138)    Pamella Pert, MD, PhD [facility] Pager [phone]  If 7PM-7AM, please contact night-coverage [email or web address] Password [email or web address] 06/07/2017, 10:16 AM

## 2017-06-07 NOTE — Progress Notes (Signed)
PT Cancellation Note  Patient Details Name: TARI JOINTER MRN: [id number] DOB: 1966-01-07   Cancelled Treatment:    Reason Eval/Treat Not Completed: Patient declined, no reason specified Patient declined due to feeling too tired. PT will continue to follow acutely.    Derek Mound, PTA Pager: [phone]   06/07/2017, 1:56 PM

## 2017-06-07 NOTE — Progress Notes (Signed)
Patient has declined the CPAP 

## 2017-06-07 NOTE — Progress Notes (Signed)
[facility] Progress Note    Assessment/ Plan:   52yo male with HTN, MS quadriplegia, Afib, DM, recent decompression of spinal abscess and spinal stenosis who is being followed for AKI and obstructive uropathy. He had a foley catheter that was not draining at SNF, after removal found to have a foreign body, yielded 2.5L urine subsequently. Also with AKI though to be secondary to vancomycin. Now s/p TDC placement on 05/04/2017 and RUE AVF on 06/01/17. Had difficulty urinating overnight, was found to have 700cc via bladder scan subsequently requiring replacement of 18 Fr with 20 Fr catheter  1. AKI on CKD III-IV, multifactorial. Secondary to obstructive uropathy vs vancomycin induced renal injury. Primary issue appears to be intermittent obstruction in the setting of neurogenic bladder. Na and K WNL. Cr overall improved but uptrending over the last few days. UOP 600cc in the last 24h with replacement of foley catheter. Primary team consulting urology. 2. UTI on day 4 of ciprofloxacin per primary. Treating based on sensitivities. Increased WBC today. 3. Anemia on ESA, stable Hgb ~8. S/p 2U RBC for acute blood loss anemia from hematochezia per primary.  4. MS with quadriplegia, recent spinal abscess. Completed treatment per primary and ID. 5. Thrombocytopenia, likely medication induced from PPI, antifungal. HIT negative. Per hematology.  6. Afib, rate controlled per primary 7. Diabetes per primary  Subjective:   States that overall unchanged today. No acute complaints. No pain. No fever/chills   Objective:   BP (!) 164/90 (BP Location: Left Leg)   Pulse 66   Temp 97.8 F (36.6 C) (Oral)   Resp 20   Ht 6\' 4"  (1.93 m)   Wt 217 lb 9.6 oz (98.7 kg) Comment: According to bed weight  SpO2 100%   BMI 26.49 kg/m   Intake/Output Summary (Last 24 hours) at 06/07/2017 1025 Last data filed at 06/07/2017 0749 Gross per 24 hour  Intake 580 ml  Output 600 ml  Net -20 ml   Weight change:    Physical Exam: Gen: Awake, laying in bed comfortably, in NAD. Obese.  L arm contracted. ZOX:[id number] rate and rhythm, normal S1 and S2, no murmurs Resp:CTAB, normal effort UJW:[id number], nontender, no palpable bladder, + bowel sounds Ext:no LE edema. Braces on both legs.  Imaging: No results found.  Labs: BMET Recent Labs  Lab 06/01/17 [phone] 06/02/17 9562 06/03/17 0500 06/04/17 0825 06/05/17 0359 06/06/17 0943 06/07/17 0303  NA 137 139 138 137 138 139 139  K 4.9 4.9 3.6 4.3 4.2 5.0 4.5  CL 102 103 100* 99* 99* 103 103  CO2 21* 23 28 23 26 24 23   GLUCOSE 207* 190* 114* 187* 193* 241* 83  BUN 50* 61* 24* 33* 36* 48* 54*  CREATININE 4.09* 4.41* 2.22* 2.88* 3.22* 3.48* 3.57*  CALCIUM 6.7* 6.6* 6.5* 6.6* 6.9* 7.0* 7.1*  PHOS 5.1*  --   --  3.9 4.3 4.2 3.9   CBC Recent Labs  Lab 06/01/17 0923 06/02/17 0642 06/03/17 0500 06/05/17 0359 06/07/17 0309  WBC 9.8 9.5 12.0* 9.9 18.6*  NEUTROABS 8.8* 8.8*  --   --   --   HGB 7.4* 7.9* 7.9* 8.2* 8.4*  HCT 21.9* 23.5* 23.7* 24.6* 25.0*  MCV 88.0 86.7 87.1 87.9 87.1  PLT 49* 38* 37* 46* 34*    Medications:    . amiodarone  200 mg Oral Daily  . amLODipine  5 mg Oral Daily  . ciprofloxacin  500 mg Oral Daily  . collagenase   Topical Daily  .  darbepoetin (ARANESP) injection - DIALYSIS  200 mcg Intravenous Q Wed-HD  . dexamethasone  3 mg Oral Q12H  . diltiazem  120 mg Oral QHS  . feeding supplement (ENSURE ENLIVE)  237 mL Oral BID BM  . guaiFENesin  600 mg Oral BID  . insulin aspart  0-5 Units Subcutaneous QHS  . insulin aspart  0-9 Units Subcutaneous TID WC  . insulin aspart  2 Units Subcutaneous TID WC  . insulin glargine  12 Units Subcutaneous Daily  . metoprolol tartrate  12.5 mg Oral BID  . mirtazapine  30 mg Oral QHS  . multivitamin  1 tablet Oral QHS      Leland Her, DO PGY-2, [facility] 06/07/2017 10:25 AM

## 2017-06-07 NOTE — Progress Notes (Signed)
Patient awake intermittently through out day, reposition q2h, assisted with feeding , medications taken w/o difficulty, pharmacy notified of medications that were not available Rep for vital bed came by to see what hold up was receiving new bariatric vital  bed.  Urology came by to assess patient, irrigated catheter and had no issues.

## 2017-06-07 NOTE — Consult Note (Signed)
Urology Consult   Physician requesting consult: Marzetta Board, MD  Reason for consult: Foley not draining  History of Present Illness: Dylan [name] is a 52 y.o. with a history of multiple medical issues, including but not limited to, multiple schlerosis, paraplegia, neurogenic bladder, recurrent UTI, DM II and CKD requiring HD.  He is currently being evaluated for acute on chronic renal failure.  Urology has been consulted to evaluate the patient's catheter after he was found to have low UOP over the past 24 hours and concern that obstructive uropathy could be the source of AKI.  RUS from today shows no evidence of hydronephrosis.  Foley catheter is in his bladder with urine surrounding the catheter balloon.     The patient denies suprapubic and/or flank discomfort, but states that he has little to no sensation at that level.    Past Medical History:  Diagnosis Date  . 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 Diabetes mellitus without complication ([place])   . Hypertension   . MS (congenital mitral stenosis) 2019  . Persistent atrial fibrillation with rapid ventricular response ([place]) 03/27/2017    Past Surgical History:  Procedure Laterality Date  . ANTERIOR CERVICAL DECOMP/DISCECTOMY FUSION N/A 03/25/2017   Procedure: ANTERIOR CERVICAL DECOMPRESSION/DISCECTOMY FUSION CERVICAL FIVE- CERVICAL SIX, CERVICAL SIX- CERVICAL SEVEN, CORPECTOMY;  Surgeon: Consuella Lose, MD;  Location: [facility];  Service: Neurosurgery;  Laterality: N/A;  . AV FISTULA PLACEMENT Right 05/18/2017   Procedure: ARTERIOVENOUS (AV) FISTULA CREATION RIGHT;  Surgeon: Angelia Mould, MD;  Location: [facility];  Service: Vascular;  Laterality: Right;  . INSERTION OF DIALYSIS CATHETER Right 05/30/2017   Procedure: INSERTION OF DIALYSIS CATHETER;  Surgeon: Angelia Mould, MD;  Location: [facility];  Service: Vascular;  Laterality: Right;  . IR FLUORO GUIDE CV LINE RIGHT  05/07/2017  . IR US GUIDE VASC ACCESS RIGHT  05/07/2017  .  NOSE SURGERY     AS A CHILD    Current Hospital Medications:  Home Meds:  Current Meds  Medication Sig  . acetaminophen (TYLENOL) 325 MG tablet Take 650 mg by mouth every 4 (four) hours as needed.  Marland Kitchen amiodarone (PACERONE) 200 MG tablet Take 1 tablet (200 mg total) by mouth daily.  Marland Kitchen b complex vitamins tablet Take 1 tablet by mouth daily.  . bisacodyl (DULCOLAX) 5 MG EC tablet Take 1 tablet (5 mg total) by mouth daily as needed for moderate constipation.  Marland Kitchen ceFEPime (MAXIPIME) IVPB Inject 2 g into the vein every 12 (twelve) hours. Indication:  Spinal abscess Last Day of Therapy:  05/16/2017 Labs - Once weekly:  CBC/D and BMP, Labs - Every other week:  ESR and CRP  . collagenase (SANTYL) ointment Apply 1 application topically daily as needed (wound care).   Marland Kitchen dexamethasone (DECADRON) 1.5 MG tablet Take 2 tablets (3 mg total) by mouth every 8 (eight) hours.  Marland Kitchen diltiazem (CARDIZEM) 30 MG tablet Take 1 tablet (30 mg total) by mouth every 8 (eight) hours.  Marland Kitchen GLUCERNA (GLUCERNA) LIQD Glucerna 1.5 - Give 240 ml three times daily  . insulin aspart (NOVOLOG FLEXPEN) 100 UNIT/ML FlexPen Inject as per sliding scale subcutaneously before meals: 0 - 120 = 0 Units 121 - 150 = 2 units 151 - 200 = 4 units 201 - 250 = 6 units 251 - 300 = 8 units 301 - 350 = 10 units 351 - 400 = 12 units 401 - 450 = 14 units Notify Provider is less than 60 or greater than 450  .  insulin aspart (NOVOLOG FLEXPEN) 100 UNIT/ML FlexPen Inject 10 Units into the skin 3 (three) times daily with meals.   . Insulin Glargine (LANTUS SOLOSTAR) 100 UNIT/ML Solostar Pen Inject 70 Units into the skin at bedtime.   Marland Kitchen LORazepam (ATIVAN) 0.5 MG tablet Take 1 tablet (0.5 mg total) by mouth every 6 (six) hours as needed for anxiety. X 2 weeks  . Melatonin 3 MG TABS Take 3 mg by mouth at bedtime.  . metFORMIN (GLUMETZA) 1000 MG (MOD) 24 hr tablet Take 1,000 mg by mouth daily with breakfast.  . methocarbamol (ROBAXIN) 500 MG tablet Take  1 tablet (500 mg total) by mouth every 6 (six) hours as needed for muscle spasms.  . Multiple Vitamin (MULTIVITAMIN) tablet Take 1 tablet by mouth daily.  . ondansetron (ZOFRAN) 4 MG tablet Take 1 tablet (4 mg total) by mouth every 6 (six) hours as needed for nausea.  . pantoprazole (PROTONIX) 40 MG tablet Take 1 tablet (40 mg total) by mouth daily.  . promethazine (PHENERGAN) 25 MG/ML injection Inject 25 mg into the vein every 8 (eight) hours as needed for nausea or vomiting.  . senna-docusate (SENOKOT-S) 8.6-50 MG tablet Take 1 tablet by mouth at bedtime as needed for mild constipation.  . sertraline (ZOLOFT) 50 MG tablet Take 50 mg by mouth daily.   . sodium chloride 0.9 % injection Inject 10 mLs into the vein 2 (two) times daily. Before and after antibiotics  . traZODone (DESYREL) 50 MG tablet Take 50 mg by mouth at bedtime as needed for sleep.  Marland Kitchen zolpidem (AMBIEN) 10 MG tablet Take 10 mg by mouth at bedtime as needed for sleep.    Scheduled Meds: . amiodarone  200 mg Oral Daily  . amLODipine  5 mg Oral Daily  . ciprofloxacin  500 mg Oral Daily  . collagenase   Topical Daily  . darbepoetin (ARANESP) injection - DIALYSIS  200 mcg Intravenous Q Wed-HD  . dexamethasone  3 mg Oral Q12H  . diltiazem  120 mg Oral QHS  . feeding supplement (ENSURE ENLIVE)  237 mL Oral BID BM  . guaiFENesin  600 mg Oral BID  . insulin aspart  0-5 Units Subcutaneous QHS  . insulin aspart  0-9 Units Subcutaneous TID WC  . insulin aspart  2 Units Subcutaneous TID WC  . insulin glargine  12 Units Subcutaneous Daily  . metoprolol tartrate  12.5 mg Oral BID  . mirtazapine  30 mg Oral QHS  . multivitamin  1 tablet Oral QHS   Continuous Infusions: . sodium chloride    . sodium chloride    . sodium chloride    . ferric gluconate (FERRLECIT/NULECIT) IV Stopped (06/06/17 1138)   PRN Meds:.sodium chloride, sodium chloride, acetaminophen, alteplase, benzonatate, bisacodyl, hydrALAZINE, ipratropium-albuterol,  labetalol, lidocaine (PF), lidocaine (PF), lidocaine-prilocaine, LORazepam, methocarbamol, metoprolol tartrate, ondansetron (ZOFRAN) IV, ondansetron, oxyCODONE-acetaminophen, pentafluoroprop-tetrafluoroeth, sodium chloride flush, traZODone  Allergies: No Known Allergies  Family History  Problem Relation Age of Onset  . Prostate cancer Father     Social History:  reports that he has never smoked. He has never used smokeless tobacco. He reports that he does not drink alcohol or use drugs.  ROS: A complete review of systems was performed.  All systems are negative except for pertinent findings as noted.  Physical Exam:  Vital signs in last 24 hours: Temp:  [97.5 F (36.4 C)-97.8 F (36.6 C)] 97.8 F (36.6 C) (05/05 2355) Pulse Rate:  [63-70] 69 (05/06 1030) Resp:  [16-20]  20 (05/06 0352) BP: (153-167)/(86-96) 153/88 (05/06 1030) SpO2:  [100 %] 100 % (05/06 0844) Constitutional:  Alert and oriented, No acute distress Cardiovascular: Regular rate and rhythm, No JVD Respiratory: Normal respiratory effort, Lungs clear bilaterally GI: Abdomen is soft, nontender, nondistended, no abdominal masses GU: No SP tenderness.  Foley in place and draining clear-yellow urine.  The Foley is under a substantional amount of traction while secured to his left lower ext and not set to gravity drainage.  Lymphatic: No lymphadenopathy Neurologic: no LE sensory or motor function Psychiatric: Normal mood and affect  Laboratory Data:  Recent Labs    06/05/17 0359 06/07/17 0309  WBC 9.9 18.6*  HGB 8.2* 8.4*  HCT 24.6* 25.0*  PLT 46* 34*    Recent Labs    06/05/17 0359 06/06/17 0943 06/07/17 0303  NA 138 139 139  K 4.2 5.0 4.5  CL 99* 103 103  GLUCOSE 193* 241* 83  BUN 36* 48* 54*  CALCIUM 6.9* 7.0* 7.1*  CREATININE 3.22* 3.48* 3.57*     Results for orders placed or performed during the hospital encounter of 04/21/2017 (from the past 24 hour(s))  Vitamin B12     Status: Abnormal    Collection Time: 06/06/17  2:54 PM  Result Value Ref Range   Vitamin B-12 944 (H) 180 - 914 pg/mL  Ammonia     Status: None   Collection Time: 06/06/17  2:54 PM  Result Value Ref Range   Ammonia 29 9 - 35 umol/L  TSH     Status: None   Collection Time: 06/06/17  2:54 PM  Result Value Ref Range   TSH 1.456 0.350 - 4.500 uIU/mL  Glucose, capillary     Status: Abnormal   Collection Time: 06/06/17  4:08 PM  Result Value Ref Range   Glucose-Capillary 250 (H) 65 - 99 mg/dL   Comment 1 Notify RN    Comment 2 Document in Chart   Glucose, capillary     Status: Abnormal   Collection Time: 06/06/17  9:16 PM  Result Value Ref Range   Glucose-Capillary 111 (H) 65 - 99 mg/dL   Comment 1 Notify RN    Comment 2 Document in Chart   Renal function panel     Status: Abnormal   Collection Time: 06/07/17  3:03 AM  Result Value Ref Range   Sodium 139 135 - 145 mmol/L   Potassium 4.5 3.5 - 5.1 mmol/L   Chloride 103 101 - 111 mmol/L   CO2 23 22 - 32 mmol/L   Glucose, Bld 83 65 - 99 mg/dL   BUN 54 (H) 6 - 20 mg/dL   Creatinine, Ser 3.57 (H) 0.61 - 1.24 mg/dL   Calcium 7.1 (L) 8.9 - 10.3 mg/dL   Phosphorus 3.9 2.5 - 4.6 mg/dL   Albumin 1.8 (L) 3.5 - 5.0 g/dL   GFR calc non Af Amer 18 (L) >60 mL/min   GFR calc Af Amer 21 (L) >60 mL/min   Anion gap 13 5 - 15  CBC     Status: Abnormal   Collection Time: 06/07/17  3:09 AM  Result Value Ref Range   WBC 18.6 (H) 4.0 - 10.5 K/uL   RBC 2.87 (L) 4.22 - 5.81 MIL/uL   Hemoglobin 8.4 (L) 13.0 - 17.0 g/dL   HCT 25.0 (L) 39.0 - 52.0 %   MCV 87.1 78.0 - 100.0 fL   MCH 29.3 26.0 - 34.0 pg   MCHC 33.6 30.0 - 36.0 g/dL  RDW 16.0 (H) 11.5 - 15.5 %   Platelets 34 (L) 150 - 400 K/uL  Glucose, capillary     Status: Abnormal   Collection Time: 06/07/17  6:36 AM  Result Value Ref Range   Glucose-Capillary 110 (H) 65 - 99 mg/dL   Comment 1 Notify RN    Comment 2 Document in Chart   Glucose, capillary     Status: Abnormal   Collection Time: 06/07/17 11:07 AM   Result Value Ref Range   Glucose-Capillary 151 (H) 65 - 99 mg/dL   Comment 1 Notify RN    Comment 2 Document in Chart    Recent Results (from the past 240 hour(s))  Culture, Urine     Status: Abnormal   Collection Time: 05/29/17  3:38 AM  Result Value Ref Range Status   Specimen Description URINE, CATHETERIZED  Final   Special Requests   Final    NONE Performed at [facility], [address], [place], [place] [address]    Culture (A)  Final    >=100,000 COLONIES/mL ESCHERICHIA COLI >=100,000 COLONIES/mL PSEUDOMONAS AERUGINOSA 20,000 COLONIES/mL STAPHYLOCOCCUS AUREUS    Report Status 06/03/2017 FINAL  Final   Organism ID, Bacteria ESCHERICHIA COLI (A)  Final   Organism ID, Bacteria PSEUDOMONAS AERUGINOSA (A)  Final   Organism ID, Bacteria STAPHYLOCOCCUS AUREUS (A)  Final      Susceptibility   Escherichia coli - MIC*    AMPICILLIN >=32 RESISTANT Resistant     CEFAZOLIN <=4 SENSITIVE Sensitive     CEFTRIAXONE <=1 SENSITIVE Sensitive     CIPROFLOXACIN <=0.25 SENSITIVE Sensitive     GENTAMICIN <=1 SENSITIVE Sensitive     IMIPENEM <=0.25 SENSITIVE Sensitive     NITROFURANTOIN <=16 SENSITIVE Sensitive     TRIMETH/SULFA <=20 SENSITIVE Sensitive     AMPICILLIN/SULBACTAM 4 SENSITIVE Sensitive     PIP/TAZO <=4 SENSITIVE Sensitive     * >=100,000 COLONIES/mL ESCHERICHIA COLI   Pseudomonas aeruginosa - MIC*    CEFTAZIDIME >=64 RESISTANT Resistant     CIPROFLOXACIN <=0.25 SENSITIVE Sensitive     GENTAMICIN 4 SENSITIVE Sensitive     IMIPENEM 2 SENSITIVE Sensitive     CEFEPIME 32 RESISTANT Resistant     * >=100,000 COLONIES/mL PSEUDOMONAS AERUGINOSA   Staphylococcus aureus - MIC*    CIPROFLOXACIN <=0.5 SENSITIVE Sensitive     GENTAMICIN <=0.5 SENSITIVE Sensitive     NITROFURANTOIN <=16 SENSITIVE Sensitive     OXACILLIN <=0.25 SENSITIVE Sensitive     TETRACYCLINE <=1 SENSITIVE Sensitive     VANCOMYCIN 1 SENSITIVE Sensitive     TRIMETH/SULFA <=10 SENSITIVE Sensitive      CLINDAMYCIN <=0.25 SENSITIVE Sensitive     RIFAMPIN <=0.5 SENSITIVE Sensitive     Inducible Clindamycin NEGATIVE Sensitive     * 20,000 COLONIES/mL STAPHYLOCOCCUS AUREUS    Renal Function: Recent Labs    06/01/17 0923 06/02/17 0642 06/03/17 0500 06/04/17 0825 06/05/17 0359 06/06/17 0943 06/07/17 0303  CREATININE 4.09* 4.41* 2.22* 2.88* 3.22* 3.48* 3.57*   Estimated Creatinine Clearance: 30.4 mL/min (A) (by C-G formula based on SCr of 3.57 mg/dL (H)).  Radiologic Imaging: US Renal  Result Date: 06/07/2017 CLINICAL DATA:  Renal failure, hypertension, diabetes EXAM: RENAL / URINARY TRACT ULTRASOUND COMPLETE COMPARISON:  Ultrasound the kidneys of 05/05/2016 and CT abdomen pelvis of 05/01/2016 FINDINGS: Right Kidney: Length: 12.4 cm. No hydronephrosis is seen. The echogenicity of the renal parenchyma is increased consistent with chronic renal medical disease. Left Kidney: Length: 12.3 cm. No hydronephrosis  is seen. Again the parenchyma is echogenic consistent with chronic renal medical disease. Bowel gas and body habitus does limit evaluation of the left kidney. Bladder: The urinary bladder is somewhat distended but Foley catheter is noted to be present and no intraluminal abnormality is seen. IMPRESSION: 1. No hydronephrosis. 2. Echogenic renal parenchyma bilaterally consistent with chronic renal medical disease. 3. Foley catheter present within the bladder Electronically Signed   By: Ivar Drape M.D.   On: 06/07/2017 10:54    I independently reviewed the above imaging studies.  Impression/Recommendation Acute on chronic renal failure (on HD) Multiple Schlerosis  Paraplegia Neurogenic bladder UTI  -The patient's Foley catheter was easily hand irrigated with return of ~500 mL of clear urine without debris or hematuria.  The catheter needs to be tension free while secured to the patient and set to gravity drainage.  The position of the patient in his bed is such that his pelvis is rotated  posteriorly, which is also likely prohibiting adequate catheter drainage.  This was discussed with the patient's nurse at bedside.  Recommend hand irrigating the Foley with ~120 mL of saline PRN decreased urinary output.  Give that the patient has no hydronephrosis on his RUS today, obstructive uropathy is not the likely source of his AKI.  No further interventions planned from a GU standpoint.  Ellison Hughs, MD [facility] 06/07/2017, 12:38 PM

## 2017-06-07 NOTE — Progress Notes (Signed)
Pt retained urineovernight. 100 mL of urine drained into bag from 2000-0200. Bladder scan showed over 700 mL retained urine in bladder. Irrigated foley with 60mL NS without success. Notified on-call MD. New order: replace 18 Fr with 20 Fr foley catheter. Irrigated with 60 mL without success. Notified MD. New order: Urology consult. Pt not complaining of pain or discomfort at this time. Will continue to monitor.

## 2017-06-07 NOTE — Progress Notes (Signed)
CSW continuing to follow for discharge needs. Patient has continued medical issues, not ready for discharge.  CSW will continue to follow.  Blenda Nicely, [place] Clinical Social Worker [phone]

## 2017-06-08 LAB — GLUCOSE, CAPILLARY
GLUCOSE-CAPILLARY: 138 mg/dL — AB (ref 65–99)
GLUCOSE-CAPILLARY: 198 mg/dL — AB (ref 65–99)
Glucose-Capillary: 124 mg/dL — ABNORMAL HIGH (ref 65–99)
Glucose-Capillary: 195 mg/dL — ABNORMAL HIGH (ref 65–99)
Glucose-Capillary: 134 mg/dL — ABNORMAL HIGH (ref 65–99)

## 2017-06-08 LAB — RENAL FUNCTION PANEL
ALBUMIN: 1.8 g/dL — AB (ref 3.5–5.0)
ANION GAP: 12 (ref 5–15)
BUN: 59 mg/dL — ABNORMAL HIGH (ref 6–20)
CALCIUM: 7.2 mg/dL — AB (ref 8.9–10.3)
CHLORIDE: 104 mmol/L (ref 101–111)
CO2: 25 mmol/L (ref 22–32)
Creatinine, Ser: 3.87 mg/dL — ABNORMAL HIGH (ref 0.61–1.24)
GFR calc Af Amer: 19 mL/min — ABNORMAL LOW (ref >60)
GFR calc non Af Amer: 17 mL/min — ABNORMAL LOW (ref >60)
GLUCOSE: 142 mg/dL — AB (ref 65–99)
POTASSIUM: 4.4 mmol/L (ref 3.5–5.1)
Phosphorus: 4.8 mg/dL — ABNORMAL HIGH (ref 2.5–4.6)
Sodium: 141 mmol/L (ref 135–145)

## 2017-06-08 LAB — CBC
HEMATOCRIT: 25.6 % — AB (ref 39.0–52.0)
HEMOGLOBIN: 8.5 g/dL — AB (ref 13.0–17.0)
MCH: 29.2 pg (ref 26.0–34.0)
MCHC: 33.2 g/dL (ref 30.0–36.0)
MCV: 88 fL (ref 78.0–100.0)
Platelets: 30 10*3/uL — ABNORMAL LOW (ref 150–400)
RBC: 2.91 MIL/uL — AB (ref 4.22–5.81)
RDW: 16.3 % — ABNORMAL HIGH (ref 11.5–15.5)
WBC: 22.2 10*3/uL — ABNORMAL HIGH (ref 4.0–10.5)

## 2017-06-08 NOTE — Progress Notes (Signed)
Physical Therapy Treatment Patient Details Name: Dylan [name] MRN: [id number] DOB: Jan 09, 1966 Today's Date: 06/08/2017    History of Present Illness Dylan [name] is a 52yo black male who comes to [facility] on 3/30 with ABD pain and hypoglycemia, admitted with cervical abcess v hematoma s/p C five-seven surgery on . 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 He DC to SNF ~3WA with motor weakness C7 down, full sensation intact. He reports graual worsening of manipulative use with smartphone over those three weaks.  underwent right-sided tunneled catheter placement for hemodialysis and right brachiocephalic AV fistula on 06/01/17    PT Comments    Patient seen for progression of tolerance to upright positioning and strengthening/ROM. Pt tolerated session well and without c/o dizziness at 45 degree tilt in Vital Lift bed.  Continue to progress as tolerated with anticipated d/c to SNF for further skilled PT services.    Follow Up Recommendations  SNF;Supervision/Assistance - 24 hour     Equipment Recommendations  Wheelchair (measurements PT);Wheelchair cushion (measurements PT)    Recommendations for Other Services OT consult     Precautions / Restrictions Precautions Precautions: Fall;Cervical Precaution Comments: watch BP; HD catheter RUE; sacral wounds Required Braces or Orthoses: Other Brace/Splint Other Brace/Splint: B resting hand splints for night use only; B WHO to use PRN during the day Restrictions Weight Bearing Restrictions: No    Mobility  Bed Mobility Overal bed mobility: Needs Assistance Bed Mobility: Rolling Rolling: Total assist;+2 for physical assistance         General bed mobility comments: cues for technique and pt using therapist's arms to hook and pull shoulders R and L for positioning of pillows prior to tilt  in bed  Transfers                 General transfer comment: pillows placed at bilateral trunk adn LE to maintian positioning while tilted; pt tilted to 45 degrees without dizziness however pt did c/o back pain at this degree; pt reported feeling more comfortable at ~35degrees   Ambulation/Gait                 Stairs             Wheelchair Mobility    Modified Rankin (Stroke Patients Only)       Balance     Sitting balance-Leahy Scale: Zero Sitting balance - Comments: pillows used as lateral supports in bed                                    Cognition Arousal/Alertness: Lethargic Behavior During Therapy: WFL for tasks assessed/performed Overall Cognitive Status: Impaired/Different from baseline Area of Impairment: Orientation;Attention;Memory;Problem solving;Awareness                 Orientation Level: Disoriented to;Time Current Attention Level: Selective Memory: Decreased short-term memory Following Commands: Follows one step commands consistently   Awareness: Emergent Problem Solving: Slow processing;Difficulty sequencing        Exercises General Exercises - Upper Extremity Shoulder Flexion: AAROM;Both;10 reps Elbow Flexion: Both;AAROM;5 reps Other Exercises: scapular elevation/depression while tilted at 45% X10 Other Exercises: hand to mouth pattern bilat UE x 5     General Comments        Pertinent Vitals/Pain Pain Assessment: Faces Faces Pain Scale:  Hurts little more Pain Location: with BUE P/AAROM, especially L elbow Pain Descriptors / Indicators: Grimacing;Guarding Pain Intervention(s): Limited activity within patient's tolerance;Monitored during session;Repositioned    Home Living                      Prior Function            PT Goals (current goals can now be found in the care plan section) Progress towards PT goals: Progressing toward goals    Frequency    Min 2X/week      PT  Plan Current plan remains appropriate    Co-evaluation              AM-PAC PT "6 Clicks" Daily Activity  Outcome Measure  Difficulty turning over in bed (including adjusting bedclothes, sheets and blankets)?: Unable Difficulty moving from lying on back to sitting on the side of the bed? : Unable Difficulty sitting down on and standing up from a chair with arms (e.g., wheelchair, bedside commode, etc,.)?: Unable Help needed moving to and from a bed to chair (including a wheelchair)?: Total Help needed walking in hospital room?: Total Help needed climbing 3-5 steps with a railing? : Total 6 Click Score: 6    End of Session Equipment Utilized During Treatment: Oxygen Activity Tolerance: Patient tolerated treatment well Patient left: in bed;with call bell/phone within reach(in chair position) Nurse Communication: Mobility status PT Visit Diagnosis: Other symptoms and signs involving the nervous system (R29.898);Difficulty in walking, not elsewhere classified (R26.2);Muscle weakness (generalized) (M62.81)     Time: 0940-7680 PT Time Calculation (min) (ACUTE ONLY): 33 min  Charges:  $Therapeutic Exercise: 8-22 mins $Therapeutic Activity: 8-22 mins                    G Codes:       Erline Levine, PTA Pager: [phone]     Carolynne Edouard 06/08/2017, 11:51 AM

## 2017-06-08 NOTE — Progress Notes (Signed)
Patient has declined the CPAP tonight.  

## 2017-06-08 NOTE — Progress Notes (Addendum)
[facility] Progress Note    Assessment/ Plan:   52yo male with HTN, MS quadriplegia, Afib, DM, recent decompression of spinal abscess and spinal stenosis who is being followed for AKI and obstructive uropathy. He had a foley catheter that was not draining at SNF, after removal found to have a foreign body, yielded 2.5L urine subsequently. Also with AKI though to be secondary to vancomycin. Now s/p TDC placement on 05/13/2017 and RUE AVF on 06/01/17.   1. AKI on CKD III-IV, multifactorial. Secondary to obstructive uropathy vs vancomycin induced renal injury. Na and K WNL. Cr overall improved but still uptrending over the last few days, today 3.87. Baseline Cr 1.5. Renal US without hydronephrosis but echogenic renal parenchyma bilaterally.  Per urology, patient has posteriorly rotated pelvis prohibiting adequate catheter drainage that can be managed with tension free/gravity drainage. Primary issue appeared to have been intermittent obstruction in the setting of neurogenic bladder as has had improvement with UOP 1050cc in the last 24h. Doing well without signs of ATN. No need for HD now, last session 06/02/17. 2. UTI on day 5 of 5 of ciprofloxacin per primary.  3. Anemia on ESA, stable Hgb ~8. S/p 2U RBC for acute blood loss anemia from hematochezia per primary.  4. MS with quadriplegia, recent spinal abscess. Completed treatment per primary and ID. 5. Thrombocytopenia, likely medication induced from PPI, antifungal. HIT negative. Per hematology.  6. Afib, rate controlled per primary 7. Diabetes per primary  Subjective:   States that doing well today. No abdominal pain or discomfort. No new concerns   Objective:   BP (!) 149/79 (BP Location: Right Leg)   Pulse 66   Temp 97.7 F (36.5 C) (Oral)   Resp 18   Ht 6\' 4"  (1.93 m)   Wt 217 lb 9.6 oz (98.7 kg) Comment: According to bed weight  SpO2 100%   BMI 26.49 kg/m   Intake/Output Summary (Last 24 hours) at 06/08/2017 0734 Last  data filed at 06/08/2017 0559 Gross per 24 hour  Intake 120 ml  Output 1050 ml  Net -930 ml   Weight change:   Physical Exam: Gen: Laying in bed comfortably, in NAD. Obese.  L arm contracted. UGQ:BVQXIHW rate and rhythm, normal S1 and S2, no murmurs Resp:CTAB, normal effort TUU:EKCM, nontender, nondistended, no palpable bladder, + bowel sounds Ext:no LE edema.   Imaging: US Renal  Result Date: 06/07/2017 CLINICAL DATA:  Renal failure, hypertension, diabetes EXAM: RENAL / URINARY TRACT ULTRASOUND COMPLETE COMPARISON:  Ultrasound the kidneys of 05/05/2016 and CT abdomen pelvis of 05/01/2016 FINDINGS: Right Kidney: Length: 12.4 cm. No hydronephrosis is seen. The echogenicity of the renal parenchyma is increased consistent with chronic renal medical disease. Left Kidney: Length: 12.3 cm. No hydronephrosis is seen. Again the parenchyma is echogenic consistent with chronic renal medical disease. Bowel gas and body habitus does limit evaluation of the left kidney. Bladder: The urinary bladder is somewhat distended but Foley catheter is noted to be present and no intraluminal abnormality is seen. IMPRESSION: 1. No hydronephrosis. 2. Echogenic renal parenchyma bilaterally consistent with chronic renal medical disease. 3. Foley catheter present within the bladder Electronically Signed   By: Dwyane Dee M.D.   On: 06/07/2017 10:54    Labs: BMET Recent Labs  Lab 06/01/17 0349 06/02/17 1791 06/03/17 0500 06/04/17 0825 06/05/17 0359 06/06/17 0943 06/07/17 0303  NA 137 139 138 137 138 139 139  K 4.9 4.9 3.6 4.3 4.2 5.0 4.5  CL 102 103 100*  99* 99* 103 103  CO2 21* 23 28 23 26 24 23   GLUCOSE 207* 190* 114* 187* 193* 241* 83  BUN 50* 61* 24* 33* 36* 48* 54*  CREATININE 4.09* 4.41* 2.22* 2.88* 3.22* 3.48* 3.57*  CALCIUM 6.7* 6.6* 6.5* 6.6* 6.9* 7.0* 7.1*  PHOS 5.1*  --   --  3.9 4.3 4.2 3.9   CBC Recent Labs  Lab 06/01/17 0923 06/02/17 0642 06/03/17 0500 06/05/17 0359 06/07/17 0309  WBC  9.8 9.5 12.0* 9.9 18.6*  NEUTROABS 8.8* 8.8*  --   --   --   HGB 7.4* 7.9* 7.9* 8.2* 8.4*  HCT 21.9* 23.5* 23.7* 24.6* 25.0*  MCV 88.0 86.7 87.1 87.9 87.1  PLT 49* 38* 37* 46* 34*    Medications:    . amiodarone  200 mg Oral Daily  . amLODipine  5 mg Oral Daily  . ciprofloxacin  500 mg Oral Daily  . collagenase   Topical Daily  . darbepoetin (ARANESP) injection - DIALYSIS  200 mcg Intravenous Q Wed-HD  . dexamethasone  3 mg Oral Q12H  . diltiazem  120 mg Oral QHS  . feeding supplement (ENSURE ENLIVE)  237 mL Oral TID BM  . guaiFENesin  600 mg Oral BID  . insulin aspart  0-5 Units Subcutaneous QHS  . insulin aspart  0-9 Units Subcutaneous TID WC  . insulin aspart  2 Units Subcutaneous TID WC  . insulin glargine  12 Units Subcutaneous Daily  . metoprolol tartrate  12.5 mg Oral BID  . mirtazapine  30 mg Oral QHS  . multivitamin  1 tablet Oral QHS      Leland Her, DO PGY-2, [facility] 06/08/2017 7:34 AM

## 2017-06-08 NOTE — Progress Notes (Signed)
PROGRESS NOTE  Dylan [name]:[id number] DOB: 01/21/1966 DOA: 11-May-2017 PCP: Patient, No Pcp Per   LOS: 38 days   Brief Narrative / Interim history: Patient is a 52 year old male with past medical history significant for recent quadriplegia, multiple sclerosis, decompression of the spinal abscess, spinal stenosis who has been  admitted for the management of acute kidney injury.  He was recently admitted to neurosurgery on 04/12/2017 and had surgery(corpectomy/decompression surgery) on 03/25/17 for cord compression due to spinal stenosis at C6 ,C5 and C7.  Postoperative course was completed with atrial fibrillation, started on Eliquis but discontinued due to spinal hematoma. Also epidural clot versus abscess was suspected on his follow up  C-spine MRI. Because of the high risk patient decided not to repeat exploratory surgery.  He was also started on IV antibiotics due to complicated UTI ,possible cervical abscess and was discharged to skilled nursing facility on 04/09/2017.   His Foley catheter was not draining at the skilled nursing facility so he was sent to the emergency department.  Found to have acute kidney injury. Urology was also consulted, flushed his Foley with removal of foreign body yeilding 2.5 L of urine. Currently patient has been managed for acute kidney injury thought to be secondary to possible vancomycin related versus obstructive nephropathy.  Nephrology following.  Patient hospital course is also complicated with sepsis, Candida glabrata fungal anemia, progressive oliguric renal failure, hyperkalemia , thrombocytopenia. Patient underwent right-sided tunneled catheter placement for hemodialysis and right brachiocephalic AV fistula on 06/01/17. His disposition will be challenging.Currently he may not be able to sit for dialysis due to ulcers on his buttocks and quadriplegia.  He might need SNF with dialysis facility out of state.  Social worker following.  Nephrology stopped  dialysis about 6 days ago given the fact that patient has started to make more urine.  Now we are now awaiting pattern to see whether he will need further dialysis.  He had difficulties with his Foley catheter not being able to drain properly, requiring upgrade from 16 Fr to 18 and ultimately to 20 [place].  Urology consulted on 5/6 and recommended that the catheter needs to be tension-free and set to gravity drainage as well as patient to be positioned such a way to provide adequate catheter drainage.  If patient will not need dialysis anymore --Per nephrology -- may be able to go to SNF  Assessment & Plan: Principal Problem:   AKI (acute kidney injury) (HCC) Active Problems:   Uncontrolled diabetes mellitus type 2 without complications (HCC)   Hypertension   Morbid obesity (HCC)   Paroxysmal atrial fibrillation (HCC)   Multiple sclerosis exacerbation (HCC)   Pressure injury of skin   Hyperkalemia   Hematoma   Insomnia   Adjustment reaction with anxiety and depression   Quadriplegia (HCC)   History of fusion of cervical spine   Postoperative wound infection   Coronary artery disease   Atherosclerotic peripheral vascular disease (HCC)   MDD (major depressive disorder), single episode, moderate (HCC)   Candidemia (HCC)   Goals of care, counseling/discussion   Palliative care by specialist   Acute lower UTI   Acute renal failure /bladder outlet obstruction -Thought to be secondary to obstructive uropathy versus vancomycin-induced renal injury.  Nephrology is following.  He has been started on dialysis since 05/07/2017. Permanent  dialysis catheter placed on 05/15/2017.  -There is also concern that patient might need stretcher dialysis which would be possible only out of state. S/P right brachiocephalic AV fistula  per vascular surgeon -16 French Foley with decreased output on 5/4, Foley exchanged for an 55 [place] on 5/4 and 20 Fr on 5/5. Still with poor UOP, seems to be retaining based on  bladder scan. -consulted urology, recommended that the catheter needs to be tension-free and set to gravity drainage as well as patient to be positioned such a way to provide adequate catheter drainage -Now his dialysis is on hold, nephrology following and awaiting to see whether he will recover renal function or he will further need dialysis.  This will determine patient's disposition  Acute encephalopathy -also with a UTI. Treated based on sensitivities -Obtained an MRI of the brain which showed mild diffusion abnormality about lesions within the bilateral occipital lobes and left thalamus which may reflect mild acute versus subacute active demyelination changes -Neurology was consulted and recommended to continue supportive treatment, continue to treat UTI but no further interventions currently.  Mental status appears to have returned to baseline, he is now alert and oriented x4  Severe thrombocytopenia  -Thought to be secondary to drug related.  Anidulafungin, daptomycin and cefepime stopped.  Protonix also stopped.  HIT negative.  Patient does not receive heparin with dialysis.  Hematology was also consulted .He has received several units of platelets transfusion. Hematology feels that this is not likely immune medicine thrombocytopenia given patient response to transfusion.  -Dr. Clelia Croft following intermittently -Platelets have overall been stable, he has no evidence of bleeding, recovery may be somewhat lengthy  GI bleed/acute blood loss anemia/acute on chronic anemia  -Patient had an episode of hematochezia, GI was consulted, his bleeding has self resolved and had no further bleeding. Recommending continue close monitoring.  Recommended to reconsult if rebleeding.  No new episodes of hematochezia . -GI reluctant to do any kind of procedures unless his platelets are more than 50,000.  Elevated troponin  -Mild.  Did not trend up.  Patient denies any chest pain.  Likely supply demand  ischemia  Quadriplegia/cervical abscess versus cervical hematoma/infected cervical hematoma  -Patient completed 6 weeks of antibiotics including 4 weeks of vancomycin and cefepime followed by 2 weeks of daptomycin and cefepime.  Course completed on 05/16/2017. -Neurosurgery was following before. ID was following, signed off. -Neurosurgery recommended steroids when he was first diagnosed in March, and a very slow taper, have change Decadron to 3 times daily to twice daily on 5/5  Neurogenic bladder  -Patient was discharged with Foley catheter on 04/09/2017 in the context of new quadriplegia. -Foley catheter exhanged on admission. Foley was taken out during this admission but had to be put back again due to urinary retention.  Continue Foley catheter.  See discussion above about upsizing his catheter and urology consultation on 5/6  UTI:  -Urine culture growing Ecoli, staph as well as Pseudomonas. Pseudomonas is resistant to Cefepime. Changed to Cipro, today is day 5 -He continues to have worsening leukocytosis however he is afebrile and appears to be clinically stable.?  Whether leukocytosis is related to steroids  Candidemia  -Patient had 10 days of  Anidulafungin which has been discontinued due to thrombocytopenia. -Ophthalmology also consulted, no evidence of intraocular fungal infection.  Echocardiogram did not show any vegetation.  Diabetes type 2  -Continue Lantus, sliding scale insulin.  -CBGs 120s to 160s keep on current regimen  Paroxysmal A. Fib -No anticoagulation given his current thrombocytopenia and concern for postop hematoma to his C-spine.   -Continue amiodarone, metoprolol and Cardizem. -sinus on telemetry   Multiple sclerosis  -Recently diagnosed  on previous admission and he was given 5 days of high-dose steroids with improvement.  Neurology was consulted at that time.  -MRI obtained on 5/3 as described above, neurology was consulted on 5/4, appreciate  input  Adjustment disorder/depression  -Continue mirtazapine.  Psychiatry was following.  Recommended trazodone.  Hyponatremia/hypokalemia  -Currently stable  Unstageable pressure injury on bilateral buttocks  -Continue supportive care.  Continue wound care.  His pressure injury on bilateral buttocks is making him unable to sit for dialysis.  Nocturnal hypoxia  -No history of OSA or the use of CPAP.  Continue supplemental oxygen as needed/CPAP at night.  Goals of care  -Palliative care was consulted before.  Remains full code    DVT prophylaxis: SCD Code Status: Full code Family Communication: d/w extensive family members 5/2 Disposition Plan: Skilled nursing facility after nephrology clearance and finding of skilled nursing facility with stretcher dialysis facility if HD is needed  Consultants:   Nephrology   Hematology   Neurology  Psychiatry   ID  Ophthalmology  GI  Procedures:  Echocardiogram, upper external vein mapping, HD  Antimicrobials:  Ciprofloxacin 5/2 >> Prior antibiotics include daptomycin, Eraxis, cefepime, vancomycin, Zosyn  Subjective: -No complaints today, no fever, no chills, no shortness of breath.  Denies any chest pain.  No abdominal pain, nausea or vomiting.  Objective: Vitals:   06/07/17 2027 06/08/17 0017 06/08/17 0501 06/08/17 0755  BP: (!) 152/88 (!) 154/81 (!) 149/79 (!) 156/82  Pulse: 72 68 66 67  Resp: (!) 22 20 18 17   Temp: 97.8 F (36.6 C) (!) 97.5 F (36.4 C) 97.7 F (36.5 C) (!) 96.3 F (35.7 C)  TempSrc: Oral Oral Oral Axillary  SpO2: 100% 100% 100% 100%  Weight:      Height:        Intake/Output Summary (Last 24 hours) at 06/08/2017 1128 Last data filed at 06/08/2017 0559 Gross per 24 hour  Intake -  Output 1050 ml  Net -1050 ml   Filed Weights   06/02/17 0029 06/06/17 0358  Weight: 127 kg (280 lb) 98.7 kg (217 lb 9.6 oz)    Examination:  Constitutional: No distress, appears comfortable Eyes: Lids and  conjunctivae normal, no scleral icterus Respiratory: Clear to auscultation bilaterally without wheezing or crackles, normal respiratory effort Cardiovascular: Regular rate and rhythm without murmurs.  No edema. Abdomen: Soft, nontender, nondistended, positive bowel sounds Skin: No rashes seen Neurologic: Quadriplegic, unchanged neuro exam   Data Reviewed: I have independently reviewed following labs and imaging studies   CBC: Recent Labs  Lab 06/02/17 0642 06/03/17 0500 06/05/17 0359 06/07/17 0309 06/08/17 0744  WBC 9.5 12.0* 9.9 18.6* 22.2*  NEUTROABS 8.8*  --   --   --   --   HGB 7.9* 7.9* 8.2* 8.4* 8.5*  HCT 23.5* 23.7* 24.6* 25.0* 25.6*  MCV 86.7 87.1 87.9 87.1 88.0  PLT 38* 37* 46* 34* 30*   Basic Metabolic Panel: Recent Labs  Lab 06/04/17 0825 06/05/17 0359 06/06/17 0943 06/07/17 0303 06/08/17 0744  NA 137 138 139 139 141  K 4.3 4.2 5.0 4.5 4.4  CL 99* 99* 103 103 104  CO2 23 26 24 23 25   GLUCOSE 187* 193* 241* 83 142*  BUN 33* 36* 48* 54* 59*  CREATININE 2.88* 3.22* 3.48* 3.57* 3.87*  CALCIUM 6.6* 6.9* 7.0* 7.1* 7.2*  PHOS 3.9 4.3 4.2 3.9 4.8*   GFR: Estimated Creatinine Clearance: 28 mL/min (A) (by C-G formula based on SCr of 3.87 mg/dL (H)). Liver  Function Tests: Recent Labs  Lab 06/05/17 0359 06/05/17 0458 06/06/17 0943 06/07/17 0303 06/08/17 0744  AST  --  23  --   --   --   ALT  --  56  --   --   --   ALKPHOS  --  115  --   --   --   BILITOT  --  1.1  --   --   --   PROT  --  5.3*  --   --   --   ALBUMIN 1.8* 1.8* 1.8* 1.8* 1.8*   No results for input(s): LIPASE, AMYLASE in the last 168 hours. Recent Labs  Lab 06/06/17 1454  AMMONIA 29   Coagulation Profile: No results for input(s): INR, PROTIME in the last 168 hours. Cardiac Enzymes: No results for input(s): CKTOTAL, CKMB, CKMBINDEX, TROPONINI in the last 168 hours. BNP (last 3 results) No results for input(s): PROBNP in the last 8760 hours. HbA1C: No results for input(s): HGBA1C  in the last 72 hours. CBG: Recent Labs  Lab 06/07/17 1107 06/07/17 1708 06/07/17 2145 06/08/17 0630 06/08/17 0822  GLUCAP 151* 146* 167* 124* 138*   Lipid Profile: No results for input(s): CHOL, HDL, LDLCALC, TRIG, CHOLHDL, LDLDIRECT in the last 72 hours. Thyroid Function Tests: Recent Labs    06/06/17 1454  TSH 1.456   Anemia Panel: Recent Labs    06/06/17 1454  VITAMINB12 944*   Urine analysis:    Component Value Date/Time   COLORURINE YELLOW 05/29/2017 0340   APPEARANCEUR CLOUDY (A) 05/29/2017 0340   LABSPEC 1.009 05/29/2017 0340   PHURINE 6.0 05/29/2017 0340   GLUCOSEU NEGATIVE 05/29/2017 0340   HGBUR MODERATE (A) 05/29/2017 0340   BILIRUBINUR NEGATIVE 05/29/2017 0340   KETONESUR NEGATIVE 05/29/2017 0340   PROTEINUR 100 (A) 05/29/2017 0340   NITRITE NEGATIVE 05/29/2017 0340   LEUKOCYTESUR LARGE (A) 05/29/2017 0340   Sepsis Labs: Invalid input(s): PROCALCITONIN, LACTICIDVEN  No results found for this or any previous visit (from the past 240 hour(s)).    Radiology Studies: US Renal  Result Date: 06/07/2017 CLINICAL DATA:  Renal failure, hypertension, diabetes EXAM: RENAL / URINARY TRACT ULTRASOUND COMPLETE COMPARISON:  Ultrasound the kidneys of 05/05/2016 and CT abdomen pelvis of 05/01/2016 FINDINGS: Right Kidney: Length: 12.4 cm. No hydronephrosis is seen. The echogenicity of the renal parenchyma is increased consistent with chronic renal medical disease. Left Kidney: Length: 12.3 cm. No hydronephrosis is seen. Again the parenchyma is echogenic consistent with chronic renal medical disease. Bowel gas and body habitus does limit evaluation of the left kidney. Bladder: The urinary bladder is somewhat distended but Foley catheter is noted to be present and no intraluminal abnormality is seen. IMPRESSION: 1. No hydronephrosis. 2. Echogenic renal parenchyma bilaterally consistent with chronic renal medical disease. 3. Foley catheter present within the bladder  Electronically Signed   By: Dwyane Dee M.D.   On: 06/07/2017 10:54     Scheduled Meds: . amiodarone  200 mg Oral Daily  . amLODipine  5 mg Oral Daily  . ciprofloxacin  500 mg Oral Daily  . collagenase   Topical Daily  . darbepoetin (ARANESP) injection - DIALYSIS  200 mcg Intravenous Q Wed-HD  . dexamethasone  3 mg Oral Q12H  . diltiazem  120 mg Oral QHS  . feeding supplement (ENSURE ENLIVE)  237 mL Oral TID BM  . guaiFENesin  600 mg Oral BID  . insulin aspart  0-5 Units Subcutaneous QHS  . insulin aspart  0-9 Units  Subcutaneous TID WC  . insulin aspart  2 Units Subcutaneous TID WC  . insulin glargine  12 Units Subcutaneous Daily  . metoprolol tartrate  12.5 mg Oral BID  . mirtazapine  30 mg Oral QHS  . multivitamin  1 tablet Oral QHS   Continuous Infusions: . sodium chloride    . sodium chloride    . sodium chloride      Pamella Pert, MD, PhD [facility] Pager [phone]  If 7PM-7AM, please contact night-coverage [email or web address] Password [email or web address] 06/08/2017, 11:28 AM

## 2017-06-08 NOTE — Progress Notes (Signed)
Spoke with primary nurse via phone regarding question about blood draw from [facility]. Reviewed that [facility] must be a TLC and there must be an order present from the renal physician for lab draws. Pt has DL HDC and will need phlebotomy to draw labs.

## 2017-06-08 NOTE — Progress Notes (Signed)
Occupational Therapy Treatment Patient Details Name: Dylan [name] MRN: [id number] DOB: July 22, 1965 Today's Date: 06/08/2017    History of present illness Dylan [name] is a 52yo black male who comes to [facility] on 3/30 with ABD pain and hypoglycemia, admitted with cervical abcess v hematoma s/p C five-seven surgery on . 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 He DC to SNF ~3WA with motor weakness C7 down, full sensation intact. He reports graual worsening of manipulative use with smartphone over those three weaks.  underwent right-sided tunneled catheter placement for hemodialysis and right brachiocephalic AV fistula on 06/01/17   OT comments  Pt continues to demonstrate some confusion - thought it was March; difficulty with maintaining thought processes at times. Focus of session on BUE A/AA/PROM and functional movement patterns in addition to using bed to place pt in chair position. [employer] bed rep present for education and completed in-service with staff regarding functions of bed.  Recommend pt be placed in chair position in bed TID. When placed in chair position, air mattress needs to be in "[place]" mode to increase air to sacral area.  Will continue to follow acutely to maximize functional level of independence.   Follow Up Recommendations  SNF;Supervision/Assistance - 24 hour    Equipment Recommendations  Wheelchair (measurements OT);Wheelchair cushion (measurements OT)    Recommendations for Other Services      Precautions / Restrictions Precautions Precautions: Fall;Cervical Precaution Comments: watch BP; HD catheter RUE; sacral wounds Required Braces or Orthoses: Other Brace/Splint Other Brace/Splint: B resting hand splints for night use only; B WHO to use PRN during the day  - mostly using WHO on L hand       Mobility Bed  Mobility Overal bed mobility: Needs Assistance Bed Mobility: Rolling Rolling: Total assist;+2 for physical assistance         General bed mobility comments: cues for technique and pt using therapist's arms to hook and pull shoulders R and L for positioning of pillows prior to tilt in bed  Transfers                 General transfer comment: pillows placed at bilateral trunk adn LE to maintian positioning while tilted; pt tilted to 45% without dizziness however pt did c/o back pain at this degree; pt reported feeling more comfortable at ~35%    Balance     Sitting balance-Leahy Scale: Zero Sitting balance - Comments: pillows used as lateral supports in bed                                   ADL either performed or assessed with clinical judgement   ADL Overall ADL's : Needs assistance/impaired   Eating/Feeding Details (indicate cue type and reason): Pt using tenodesis grasp to feed self finger foods                                 Functional mobility during ADLs: Total assistance;+2 for physical assistance General ADL Comments: Bed moved into chair position. Air mattress on.  Conversation with pt regarding the importance of him directing his patient care, knowing when to have splints on/how to manage splints and positioning, moving him into a seated position  Vision       Perception     Praxis      Cognition Arousal/Alertness: Lethargic(initially) Behavior During Therapy: WFL for tasks assessed/performed Overall Cognitive Status: Impaired/Different from baseline Area of Impairment: Orientation;Attention;Memory;Problem solving;Awareness                 Orientation Level: Disoriented to;Time Current Attention Level: Selective Memory: Decreased short-term memory Following Commands: Follows one step commands consistently   Awareness: Emergent Problem Solving: Slow processing;Difficulty sequencing          Exercises  General Exercises - Upper Extremity Shoulder Flexion: AAROM;Both;10 reps Shoulder Extension: AAROM;Both;15 reps;Supine Shoulder ABduction: Both;10 reps Shoulder ADduction: AAROM;Both;15 reps;Supine Shoulder Horizontal ABduction: Both;10 reps Shoulder Horizontal ADduction: Both;10 reps Elbow Flexion: Both;AAROM;5 reps Elbow Extension: AAROM;Both;Supine;5 reps Wrist Flexion: AROM;15 reps;Supine;Both Wrist Extension: Left;PROM;10 reps;Right;Strengthening;15 reps Digit Composite Flexion: PROM;Both;10 reps;Supine Composite Extension: PROM;Both;10 reps;Supine Other Exercises Other Exercises: scapular elevation/depression while tilted at 45% X10 Other Exercises: hand to mouth pattern bilat UE x 5 Other Exercises: gentle passive stretch of L elbow into extension after gentle soft tissue work @ elbow. Able to achieve extension -20-30 degrees   Shoulder Instructions       General Comments  BUE strength improving slowly. Pt appears to have trace R thumb movement. Wrist ext 3/5 Developing L elbow flexion contracture due to abnormal tone and muscle imbalance- positions with LUE supinated and flexed at all times/position of comfort. Will need elbow splint for night use. Will contact [employer].     Pertinent Vitals/ Pain       Pain Assessment: Faces Faces Pain Scale: Hurts little more Pain Location: with BUE P/AAROM, especially L elbow Pain Descriptors / Indicators: Grimacing;Guarding Pain Intervention(s): Limited activity within patient's tolerance;Monitored during session;Repositioned  Home Living                                          Prior Functioning/Environment              Frequency  Min 3X/week        Progress Toward Goals  OT Goals(current goals can now be found in the care plan section)  Progress towards OT goals: Progressing toward goals  Acute Rehab OT Goals Patient Stated Goal: to get better OT Goal Formulation: With patient Time For Goal  Achievement: 06/10/17 Potential to Achieve Goals: Fair ADL Goals Pt Will Perform Eating: with mod assist;with adaptive utensils;sitting Pt Will Perform Grooming: with mod assist;sitting;with adaptive equipment Pt/caregiver will Perform Home Exercise Program: Increased ROM;Increased strength;Both right and left upper extremity;With minimal assist Additional ADL Goal #1: Pt will demonstrate ability to self-direct personal care during ADL/mobility completion. Additional ADL Goal #2: Pt will independently direct caregiver on donning/doffing WHO and purpose of splints. Additional ADL Goal #3: Pt will tolerate B resting hand splints to improve functional positioning of hands without complications  Plan Discharge plan remains appropriate    Co-evaluation                 AM-PAC PT "6 Clicks" Daily Activity     Outcome Measure   Help from another person eating meals?: A Lot Help from another person taking care of personal grooming?: A Lot Help from another person toileting, which includes using toliet, bedpan, or urinal?: Total Help from another person bathing (including washing, rinsing, drying)?: Total Help from another person to put on and taking off regular  upper body clothing?: Total Help from another person to put on and taking off regular lower body clothing?: Total 6 Click Score: 8    End of Session Equipment Utilized During Treatment: Oxygen  OT Visit Diagnosis: Muscle weakness (generalized) (M62.81);Other symptoms and signs involving the nervous system (R29.898)   Activity Tolerance Patient tolerated treatment well   Patient Left in bed;with call bell/phone within reach;with SCD's reapplied(chair position in bed)   Nurse Communication Other (comment)(operation of bed; need for SCDs)        Time: 0900-0930 OT Time Calculation (min): 30 min  Charges: OT General Charges $OT Visit: 1 Visit OT Treatments $Therapeutic Activity: 23-37 mins  [facility], OT/L   [phone] 06/08/2017   Faria Casella,HILLARY 06/08/2017, 12:09 PM

## 2017-06-09 ENCOUNTER — Inpatient Hospital Stay (HOSPITAL_COMMUNITY): Payer: Medicaid Other

## 2017-06-09 LAB — RENAL FUNCTION PANEL
ALBUMIN: 1.8 g/dL — AB (ref 3.5–5.0)
Anion gap: 11 (ref 5–15)
BUN: 61 mg/dL — ABNORMAL HIGH (ref 6–20)
CO2: 26 mmol/L (ref 22–32)
CREATININE: 3.99 mg/dL — AB (ref 0.61–1.24)
Calcium: 7.5 mg/dL — ABNORMAL LOW (ref 8.9–10.3)
Chloride: 106 mmol/L (ref 101–111)
GFR calc non Af Amer: 16 mL/min — ABNORMAL LOW (ref >60)
GFR, EST AFRICAN AMERICAN: 19 mL/min — AB (ref >60)
GLUCOSE: 222 mg/dL — AB (ref 65–99)
PHOSPHORUS: 4.7 mg/dL — AB (ref 2.5–4.6)
Potassium: 4.7 mmol/L (ref 3.5–5.1)
SODIUM: 143 mmol/L (ref 135–145)

## 2017-06-09 LAB — CBC
HCT: 24.9 % — ABNORMAL LOW (ref 39.0–52.0)
Hemoglobin: 8.1 g/dL — ABNORMAL LOW (ref 13.0–17.0)
MCH: 28.8 pg (ref 26.0–34.0)
MCHC: 32.5 g/dL (ref 30.0–36.0)
MCV: 88.6 fL (ref 78.0–100.0)
PLATELETS: 35 10*3/uL — AB (ref 150–400)
RBC: 2.81 MIL/uL — AB (ref 4.22–5.81)
RDW: 16.5 % — ABNORMAL HIGH (ref 11.5–15.5)
WBC: 23.5 10*3/uL — ABNORMAL HIGH (ref 4.0–10.5)

## 2017-06-09 LAB — GLUCOSE, CAPILLARY
GLUCOSE-CAPILLARY: 214 mg/dL — AB (ref 65–99)
GLUCOSE-CAPILLARY: 126 mg/dL — AB (ref 65–99)
Glucose-Capillary: 212 mg/dL — ABNORMAL HIGH (ref 65–99)
Glucose-Capillary: 143 mg/dL — ABNORMAL HIGH (ref 65–99)

## 2017-06-09 LAB — OSMOLALITY, URINE: Osmolality, Ur: 329 mOsm/kg (ref 300–900)

## 2017-06-09 LAB — CREATININE, URINE, RANDOM: Creatinine, Urine: 29.71 mg/dL

## 2017-06-09 LAB — SODIUM, URINE, RANDOM: Sodium, Ur: 85 mmol/L

## 2017-06-09 NOTE — Evaluation (Signed)
Clinical/Bedside Swallow Evaluation Patient Details  Name: Dylan [name] MRN: [id number] Date of Birth: 09-04-1965  Today's Date: 06/09/2017 Time: SLP Start Time (ACUTE ONLY): 1615 SLP Stop Time (ACUTE ONLY): 1638 SLP Time Calculation (min) (ACUTE ONLY): 23 min  Past Medical History:  Past Medical History:  Diagnosis Date  . 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 Diabetes mellitus without complication (HCC)   . Hypertension   . MS (congenital mitral stenosis) 2019  . Persistent atrial fibrillation with rapid ventricular response (HCC) 03/27/2017   Past Surgical History:  Past Surgical History:  Procedure Laterality Date  . ANTERIOR CERVICAL DECOMP/DISCECTOMY FUSION N/A 03/25/2017   Procedure: ANTERIOR CERVICAL DECOMPRESSION/DISCECTOMY FUSION CERVICAL FIVE- CERVICAL SIX, CERVICAL SIX- CERVICAL SEVEN, CORPECTOMY;  Surgeon: Lisbeth Renshaw, MD;  Location: [facility] OR;  Service: Neurosurgery;  Laterality: N/A;  . AV FISTULA PLACEMENT Right 2017-06-07   Procedure: ARTERIOVENOUS (AV) FISTULA CREATION RIGHT;  Surgeon: Chuck Hint, MD;  Location[facility] OR;  Service: Vascular;  Laterality: Right;  . INSERTION OF DIALYSIS CATHETER Right 2017-06-07   Procedure: INSERTION OF DIALYSIS CATHETER;  Surgeon: Chuck Hint, MD;  Location: [facility] OR;  Service: Vascular;  Laterality: Right;  . IR FLUORO GUIDE CV LINE RIGHT  05/07/2017  . IR US GUIDE VASC ACCESS RIGHT  05/07/2017  . NOSE SURGERY     AS A CHILD   HPI:  Dylan [name] 52 yo admitted on 3/30 with ABD pain and hypoglycemia with cervical abcess v hematoma s/p C five-seven surgery. Pt was having progressive motor loss and per chart, noted to have both severe spinal cord compression as well as MS legions in CNS, Underwent ACDF  operation C5-7, with contiued radicular compromise 03/2017. Per chart pt discharged to SNF with motor weakness, full sensation intact with gradual worsening of manual dexterity using smartphone.  Underwent right-sided  tunneled catheter placement for hemodialysis and right brachiocephalic AV fistula on 06/01/17. CXR CHF with interstitial edema and bilateral pleural effusions. The findings are slightly more conspicuous today. Swallow order states pt is coughing with liquids frequently.   Assessment / Plan / Recommendation Clinical Impression  Pt presents with generalized deconditioning with prolonged hospitalization exacerbated by ACDF 03/2917 and recent MS diagnosis. As SLP arrived pt coughing and stated he just drank water. Lingual ROM is imprecise on the left and tremorous and volitional as well as reflexive cough is significantly weak and unproductive. Assisted pt with quad cough which may havebeen marginally effective. He consumed 3 oz water without immediate cough although delayed weak coughing initiated and continued for 3-5 minutes before subsiding. Will continue current regular texture and thin but advised pt and RN to stop if coughing episodes return. Recommend FEES tomorrow to fully assess.      SLP Visit Diagnosis: Dysphagia, unspecified (R13.10)    Aspiration Risk  Moderate aspiration risk    Diet Recommendation Regular;Thin liquid   Liquid Administration via: Cup;Straw Medication Administration: Whole meds with puree Supervision: Patient able to self feed;Staff to assist with self feeding Compensations: Slow rate;Small sips/bites Postural Changes: Seated upright at 90 degrees    Other  Recommendations Oral Care Recommendations: Oral care BID   Follow up Recommendations Other (comment)(TBD)      Frequency and Duration            Prognosis        Swallow Study   General HPI: Dylan [name] 52 yo admitted on 3/30 with ABD pain and hypoglycemia with cervical abcess v hematoma s/p C five-seven  surgery. Pt was having progressive motor loss and per chart, noted to have both severe spinal cord compression as well as MS legions in CNS, Underwent ACDF  operation C5-7, with contiued radicular  compromise 03/2017. Per chart pt discharged to SNF with motor weakness, full sensation intact with gradual worsening of manual dexterity using smartphone.  Underwent right-sided tunneled catheter placement for hemodialysis and right brachiocephalic AV fistula on 06/01/17. CXR CHF with interstitial edema and bilateral pleural effusions. The findings are slightly more conspicuous today. Swallow order states pt is coughing with liquids frequently. Type of Study: Bedside Swallow Evaluation Previous Swallow Assessment: (none) Diet Prior to this Study: Regular;Thin liquids Temperature Spikes Noted: No Respiratory Status: Nasal cannula History of Recent Intubation: No Behavior/Cognition: Pleasant mood;Cooperative;Alert Oral Cavity Assessment: Other (comment)(lingual candidias?- will assess further) Oral Care Completed by SLP: No Oral Cavity - Dentition: Adequate natural dentition Vision: Functional for self-feeding Self-Feeding Abilities: Needs assist;Needs set up Patient Positioning: Upright in bed Baseline Vocal Quality: Normal Volitional Cough: Weak;Congested Volitional Swallow: Able to elicit    Oral/Motor/Sensory Function Overall Oral Motor/Sensory Function: Mild impairment Facial ROM: (general weakness) Facial Symmetry: Within Functional Limits Facial Strength: (general weakness) Lingual ROM: Other (Comment)(tremorous, needed cues to touch left corner) Lingual Symmetry: Within Functional Limits   Ice Chips Ice chips: Not tested   Thin Liquid Thin Liquid: Impaired Presentation: Cup;Straw Oral Phase Impairments: (none) Oral Phase Functional Implications: (oral hesitation in transit) Pharyngeal  Phase Impairments: Cough - Immediate;Cough - Delayed    Nectar Thick Nectar Thick Liquid: Not tested   Honey Thick Honey Thick Liquid: Not tested   Puree Puree: Within functional limits   Solid   GO   Solid: Not tested        Royce Macadamia 06/09/2017,5:26 PM    lumbosacral

## 2017-06-09 NOTE — Progress Notes (Addendum)
PROGRESS NOTE  EVERT WENRICH ZOX:[id number] DOB: October 28, 1965 DOA: 05/10/2017 PCP: Patient, No Pcp Per   LOS: 39 days   Brief Narrative / Interim history: Patient is a 52 year old male with past medical history significant for recent quadriplegia, multiple sclerosis, decompression of the spinal abscess, spinal stenosis who has been  admitted for the management of acute kidney injury.  He was recently admitted to neurosurgery on 04/12/2017 and had surgery(corpectomy/decompression surgery) on 03/25/17 for cord compression due to spinal stenosis at C6 ,C5 and C7.  Postoperative course was completed with atrial fibrillation, started on Eliquis but discontinued due to spinal hematoma. Also epidural clot versus abscess was suspected on his follow up  C-spine MRI. Because of the high risk patient decided not to repeat exploratory surgery.  He was also started on IV antibiotics due to complicated UTI, possible cervical abscess and was discharged to skilled nursing facility on 04/09/2017.   His Foley catheter was not draining at the skilled nursing facility so he was sent to the emergency department.  Found to have acute kidney injury. Urology was also consulted, flushed his Foley with removal of foreign body yeilding 2.5 L of urine. Currently patient has been managed for acute kidney injury thought to be secondary to possible vancomycin related versus obstructive nephropathy.  Nephrology following.  Patient hospital course is also complicated with sepsis, Candida glabrata fungal,  anemia, progressive oliguric renal failure, hyperkalemia , thrombocytopenia. Patient underwent right-sided tunneled catheter placement for hemodialysis and right brachiocephalic AV fistula on 06/01/17. His disposition will be challenging.Currently he may not be able to sit for dialysis due to ulcers on his buttocks and quadriplegia.  He might need SNF with dialysis facility out of state.  Social worker following.  Nephrology stopped  dialysis about 7 days ago given the fact that patient has started to make more urine.  Now we are now awaiting pattern to see whether he will need further dialysis.  He had difficulties with his Foley catheter not being able to drain properly, requiring upgrade from 16 Fr to 18 and ultimately to 20 [place].  Urology consulted on 5/6 and recommended that the catheter needs to be tension-free and set to gravity drainage as well as patient to be positioned such a way to provide adequate catheter drainage.  If patient will not need dialysis anymore --Per nephrology -- may be able to go to SNF  Assessment & Plan: Principal Problem:   AKI (acute kidney injury) (HCC) Active Problems:   Uncontrolled diabetes mellitus type 2 without complications (HCC)   Hypertension   Morbid obesity (HCC)   Paroxysmal atrial fibrillation (HCC)   Multiple sclerosis exacerbation (HCC)   Pressure injury of skin   Hyperkalemia   Hematoma   Insomnia   Adjustment reaction with anxiety and depression   Quadriplegia (HCC)   History of fusion of cervical spine   Postoperative wound infection   Coronary artery disease   Atherosclerotic peripheral vascular disease (HCC)   MDD (major depressive disorder), single episode, moderate (HCC)   Candidemia (HCC)   Goals of care, counseling/discussion   Palliative care by specialist   Acute lower UTI   Acute renal failure /bladder outlet obstruction -Thought to be secondary to obstructive uropathy versus vancomycin-induced renal injury.  Nephrology is following.  He has been started on dialysis since 05/07/2017. Permanent  dialysis catheter placed on 05/17/2017.  -There is also concern that patient might need stretcher dialysis which would be possible only out of state. S/P right brachiocephalic AV  fistula per vascular surgeon -16 French Foley with decreased output on 5/4, Foley exchanged for an 33 [place] on 5/4 and 20 Fr on 5/5. Still with poor UOP, seems to be retaining based on  bladder scan. -consulted urology, recommended that the catheter needs to be tension-free and set to gravity drainage as well as patient to be positioned such a way to provide adequate catheter drainage -Now his dialysis is on hold, nephrology following and awaiting to see whether he will recover renal function or he will further need dialysis.  This will determine patient's disposition -repeat Bladder scan.   Cough;  Episode of cough this morning.  Chest x ray consistent with CHF. Oxygen sat remain stable. Will monitor overnight. Discussed with nephrology  Speech swallow evaluation.   Acute encephalopathy -also with a UTI. Treated based on sensitivities -Obtained an MRI of the brain which showed mild diffusion abnormality about lesions within the bilateral occipital lobes and left thalamus which may reflect mild acute versus subacute active demyelination changes -Neurology was consulted and recommended to continue supportive treatment, continue to treat UTI but no further interventions currently.  Mental status at baseline.   Severe thrombocytopenia  -Thought to be secondary to drug related.  Anidulafungin, daptomycin and cefepime stopped.  Protonix also stopped.  HIT negative.  Patient does not receive heparin with dialysis.  Hematology was also consulted .He has received several units of platelets transfusion. Hematology feels that this is not likely immune medicine thrombocytopenia given patient response to transfusion.  -Dr. Clelia Croft following intermittently -platelet stable.   GI bleed/acute blood loss anemia/acute on chronic anemia  -Patient had an episode of hematochezia, GI was consulted, his bleeding has self resolved and had no further bleeding. Recommending continue close monitoring.  Recommended to reconsult if rebleeding.  No new episodes of hematochezia . -GI reluctant to do any kind of procedures unless his platelets are more than 50,000. -Hb stable at 8.   Elevated troponin    -Mild.  Did not trend up.  Patient denies any chest pain.  Likely supply demand ischemia  Quadriplegia/cervical abscess versus cervical hematoma/infected cervical hematoma  -Patient completed 6 weeks of antibiotics including 4 weeks of vancomycin and cefepime followed by 2 weeks of daptomycin and cefepime.  Course completed on 05/16/2017. -Neurosurgery was following before. ID was following, signed off. -Neurosurgery recommended steroids when he was first diagnosed in March, and a very slow taper, have change Decadron to 3 times daily to twice daily on 5/5  Neurogenic bladder  -Patient was discharged with Foley catheter on 04/09/2017 in the context of new quadriplegia. -Foley catheter exhanged on admission. Foley was taken out during this admission but had to be put back again due to urinary retention.  Continue Foley catheter.  See discussion above about upsizing his catheter and urology consultation on 5/6  UTI:  -Urine culture growing Ecoli, staph as well as Pseudomonas. Pseudomonas is resistant to Cefepime. Now on  Cipro, day 6 -continue to have leukocytosis. Follow trend.   Leukocytosis;  On cipro for UTI.  Afebrile.  Chest xray 5-08 negative for PNA.  If persist or spike fever will repeat blood culture.   Candidemia  -Patient had 10 days of  Anidulafungin which has been discontinued due to thrombocytopenia. -Ophthalmology also consulted, no evidence of intraocular fungal infection.  Echocardiogram did not show any vegetation.  Diabetes type 2  -Continue Lantus, sliding scale insulin.  -CBGs 120s to 160s keep on current regimen  Paroxysmal A. Fib -No anticoagulation given  his current thrombocytopenia and concern for postop hematoma to his C-spine.   -Continue amiodarone, metoprolol and Cardizem. -sinus on telemetry   Multiple sclerosis  -Recently diagnosed on previous admission and he was given 5 days of high-dose steroids with improvement.  Neurology was consulted at  that time.  -MRI obtained on 5/3 as described above, neurology was consulted on 5/4, appreciate input  Adjustment disorder/depression  -Continue mirtazapine.  Psychiatry was following.  Recommended trazodone.  Hyponatremia/hypokalemia  -Currently stable  Unstageable pressure injury on bilateral buttocks  -Continue supportive care.  Continue wound care.  His pressure injury on bilateral buttocks is making him unable to sit for dialysis.  Nocturnal hypoxia  -No history of OSA or the use of CPAP.  Continue supplemental oxygen as needed/CPAP at night.  Goals of care  -Palliative care was consulted before.  Remains full code    DVT prophylaxis: SCD Code Status: Full code Family Communication: d/w extensive family members 5/2 Disposition Plan: Skilled nursing facility after nephrology clearance and finding of skilled nursing facility with stretcher dialysis facility if HD is needed  Consultants:   Nephrology   Hematology   Neurology  Psychiatry   ID  Ophthalmology  GI  Procedures:  Echocardiogram, upper external vein mapping, HD  Antimicrobials:  Ciprofloxacin 5/2 >> Prior antibiotics include daptomycin, Eraxis, cefepime, vancomycin, Zosyn  Subjective: Coughing this am.   Objective: Vitals:   06/08/17 2337 06/09/17 0403 06/09/17 0842 06/09/17 1223  BP: 123/79 134/84 (!) 165/82 (!) 167/82  Pulse: 74 73 78 75  Resp: 18 18 16 18   Temp: (!) 97.4 F (36.3 C) 97.6 F (36.4 C) (!) 97.5 F (36.4 C) 97.6 F (36.4 C)  TempSrc: Oral Oral Oral Oral  SpO2: 100% 100% 92% 93%  Weight:  109.8 kg (242 lb 1 oz)    Height:        Intake/Output Summary (Last 24 hours) at 06/09/2017 1413 Last data filed at 06/09/2017 1200 Gross per 24 hour  Intake 250 ml  Output 1075 ml  Net -825 ml   Filed Weights   06/06/17 0358 06/08/17 1100 06/09/17 0403  Weight: 98.7 kg (217 lb 9.6 oz) 110 kg (242 lb 8 oz) 109.8 kg (242 lb 1 oz)    Examination:  Constitutional: No acute  distress.  Eyes: conjunctivae normal.  Respiratory: normal respiratory effort. Few crackles.  Cardiovascular:  S 1, S 2 RRR Abdomen: Soft, NT Skin: No rashes.  Neurologic: Quadriplegic.    Data Reviewed: I have independently reviewed following labs and imaging studies   CBC: Recent Labs  Lab 06/03/17 0500 06/05/17 0359 06/07/17 0309 06/08/17 0744 06/09/17 0543  WBC 12.0* 9.9 18.6* 22.2* 23.5*  HGB 7.9* 8.2* 8.4* 8.5* 8.1*  HCT 23.7* 24.6* 25.0* 25.6* 24.9*  MCV 87.1 87.9 87.1 88.0 88.6  PLT 37* 46* 34* 30* 35*   Basic Metabolic Panel: Recent Labs  Lab 06/05/17 0359 06/06/17 0943 06/07/17 0303 06/08/17 0744 06/09/17 0543  NA 138 139 139 141 143  K 4.2 5.0 4.5 4.4 4.7  CL 99* 103 103 104 106  CO2 26 24 23 25 26   GLUCOSE 193* 241* 83 142* 222*  BUN 36* 48* 54* 59* 61*  CREATININE 3.22* 3.48* 3.57* 3.87* 3.99*  CALCIUM 6.9* 7.0* 7.1* 7.2* 7.5*  PHOS 4.3 4.2 3.9 4.8* 4.7*   GFR: Estimated Creatinine Clearance: 30.1 mL/min (A) (by C-G formula based on SCr of 3.99 mg/dL (H)). Liver Function Tests: Recent Labs  Lab 06/05/17 0458 06/06/17 0943 06/07/17  0303 06/08/17 0744 06/09/17 0543  AST 23  --   --   --   --   ALT 56  --   --   --   --   ALKPHOS 115  --   --   --   --   BILITOT 1.1  --   --   --   --   PROT 5.3*  --   --   --   --   ALBUMIN 1.8* 1.8* 1.8* 1.8* 1.8*   No results for input(s): LIPASE, AMYLASE in the last 168 hours. Recent Labs  Lab 06/06/17 1454  AMMONIA 29   Coagulation Profile: No results for input(s): INR, PROTIME in the last 168 hours. Cardiac Enzymes: No results for input(s): CKTOTAL, CKMB, CKMBINDEX, TROPONINI in the last 168 hours. BNP (last 3 results) No results for input(s): PROBNP in the last 8760 hours. HbA1C: No results for input(s): HGBA1C in the last 72 hours. CBG: Recent Labs  Lab 06/08/17 1153 06/08/17 1715 06/08/17 2126 06/09/17 0612 06/09/17 1220  GLUCAP 195* 134* 198* 214* 212*   Lipid Profile: No results  for input(s): CHOL, HDL, LDLCALC, TRIG, CHOLHDL, LDLDIRECT in the last 72 hours. Thyroid Function Tests: Recent Labs    06/06/17 1454  TSH 1.456   Anemia Panel: Recent Labs    06/06/17 1454  VITAMINB12 944*   Urine analysis:    Component Value Date/Time   COLORURINE YELLOW 05/29/2017 0340   APPEARANCEUR CLOUDY (A) 05/29/2017 0340   LABSPEC 1.009 05/29/2017 0340   PHURINE 6.0 05/29/2017 0340   GLUCOSEU NEGATIVE 05/29/2017 0340   HGBUR MODERATE (A) 05/29/2017 0340   BILIRUBINUR NEGATIVE 05/29/2017 0340   KETONESUR NEGATIVE 05/29/2017 0340   PROTEINUR 100 (A) 05/29/2017 0340   NITRITE NEGATIVE 05/29/2017 0340   LEUKOCYTESUR LARGE (A) 05/29/2017 0340   Sepsis Labs: Invalid input(s): PROCALCITONIN, LACTICIDVEN  No results found for this or any previous visit (from the past 240 hour(s)).    Radiology Studies: Dg Chest Port 1 View  Result Date: 06/09/2017 CLINICAL DATA:  Cough. History of atrial fibrillation, mitral stenosis, diabetes, multiple scleroses, acute kidney injury. EXAM: PORTABLE CHEST 1 VIEW COMPARISON:  Chest x-ray of 2017-06-16 FINDINGS: The lungs are reasonably well inflated. The interstitial markings are increased. There is a moderate-sized right pleural effusion and trace left pleural effusion. The cardiac silhouette is enlarged. There is calcification in the wall of the aortic arch. The dialysis catheter tip projects at the cavoatrial junction. The bony thorax exhibits no acute abnormality. IMPRESSION: CHF with interstitial edema and bilateral pleural effusions. The findings are slightly more conspicuous today. Thoracic aortic atherosclerosis. Electronically Signed   By: David  [place] M.D.   On: 06/09/2017 13:40     Scheduled Meds: . amiodarone  200 mg Oral Daily  . amLODipine  5 mg Oral Daily  . ciprofloxacin  500 mg Oral Daily  . collagenase   Topical Daily  . darbepoetin (ARANESP) injection - DIALYSIS  200 mcg Intravenous Q Wed-HD  . dexamethasone  3 mg  Oral Q12H  . diltiazem  120 mg Oral QHS  . feeding supplement (ENSURE ENLIVE)  237 mL Oral TID BM  . guaiFENesin  600 mg Oral BID  . insulin aspart  0-5 Units Subcutaneous QHS  . insulin aspart  0-9 Units Subcutaneous TID WC  . insulin aspart  2 Units Subcutaneous TID WC  . insulin glargine  12 Units Subcutaneous Daily  . metoprolol tartrate  12.5 mg Oral BID  .  mirtazapine  30 mg Oral QHS  . multivitamin  1 tablet Oral QHS   Continuous Infusions: . sodium chloride    . sodium chloride    . sodium chloride     Hartley Barefoot, MD [facility] Pager [phone]  If 7PM-7AM, please contact night-coverage [email or web address] Password [email or web address] 06/09/2017, 2:13 PM

## 2017-06-09 NOTE — Progress Notes (Signed)
[name] Progress Note    Assessment/ Plan:   52yo male with HTN, MS quadriplegia, Afib, DM, recent decompression of spinal abscess and spinal stenosis who is being followed for AKI and obstructive uropathy. He had a foley catheter that was not draining at SNF, after removal found to have a foreign body, yielded 2.5L urine subsequently. Also with AKI thought to be secondary to vancomycin but has been off for several weeks. Now s/p TDC placement on 05/29/2017 and RUE AVF on 06/01/17.   1. AKI on CKD III-IV. Initially thought to be due to obstructive uropathy, now uncertain. BUN Cr ratio 15 c/w normal or postrenal etiology. UOP improved with urology recommending tension free/gravity drainage. However Cr continues to uptrend 2.2 >3.4>3.9 with normal Na and K.  Renal US without hydronephrosis but echogenic renal parenchyma bilaterally. Has been on ciprofloxacin but no other nephrotoxic agents. Will obtain urine studies. No need for HD now, last session 06/02/17. 2. UTI on ciprofloxacin per primary.  3. Anemia on ESA, stable Hgb ~8. S/p 2U RBC for acute blood loss anemia from hematochezia per primary.  4. MS with quadriplegia, recent spinal abscess. Completed treatment per primary and ID. 5. Thrombocytopenia, likely medication induced from PPI, antifungal. HIT negative. Per hematology.  6. Afib, rate controlled per primary 7. Diabetes per primary  Subjective:   States has been feeling well. Not eating as much.    Objective:   BP 134/84 (BP Location: Left Arm)   Pulse 73   Temp 97.6 F (36.4 C) (Oral)   Resp 18   Ht 6\' 4"  (1.93 m)   Wt 242 lb 1 oz (109.8 kg)   SpO2 100%   BMI 29.47 kg/m   Intake/Output Summary (Last 24 hours) at 06/09/2017 0751 Last data filed at 06/09/2017 0600 Gross per 24 hour  Intake 200 ml  Output 850 ml  Net -650 ml   Weight change:   Physical Exam: Gen: Laying in bed comfortably, in NAD. Awake and alert. Obese.  L arm contracted. HEK:BTCYELY rate  and rhythm, normal S1 and S2, no murmurs Resp:CTAB, normal effort HTM:BPJP, nontender, nondistended, no palpable bladder, + bowel sounds Ext:no LE edema.   Imaging: US Renal  Result Date: 06/07/2017 CLINICAL DATA:  Renal failure, hypertension, diabetes EXAM: RENAL / URINARY TRACT ULTRASOUND COMPLETE COMPARISON:  Ultrasound the kidneys of 05/05/2016 and CT abdomen pelvis of 05/01/2016 FINDINGS: Right Kidney: Length: 12.4 cm. No hydronephrosis is seen. The echogenicity of the renal parenchyma is increased consistent with chronic renal medical disease. Left Kidney: Length: 12.3 cm. No hydronephrosis is seen. Again the parenchyma is echogenic consistent with chronic renal medical disease. Bowel gas and body habitus does limit evaluation of the left kidney. Bladder: The urinary bladder is somewhat distended but Foley catheter is noted to be present and no intraluminal abnormality is seen. IMPRESSION: 1. No hydronephrosis. 2. Echogenic renal parenchyma bilaterally consistent with chronic renal medical disease. 3. Foley catheter present within the bladder Electronically Signed   By: Dwyane Dee M.D.   On: 06/07/2017 10:54    Labs: BMET Recent Labs  Lab 06/03/17 0500 06/04/17 0825 06/05/17 0359 06/06/17 0943 06/07/17 0303 06/08/17 0744 06/09/17 0543  NA 138 137 138 139 139 141 143  K 3.6 4.3 4.2 5.0 4.5 4.4 4.7  CL 100* 99* 99* 103 103 104 106  CO2 28 23 26 24 23 25 26   GLUCOSE 114* 187* 193* 241* 83 142* 222*  BUN 24* 33* 36* 48* 54* 59* 61*  CREATININE 2.22* 2.88* 3.22* 3.48* 3.57* 3.87* 3.99*  CALCIUM 6.5* 6.6* 6.9* 7.0* 7.1* 7.2* 7.5*  PHOS  --  3.9 4.3 4.2 3.9 4.8* 4.7*   CBC Recent Labs  Lab 06/05/17 0359 06/07/17 0309 06/08/17 0744 06/09/17 0543  WBC 9.9 18.6* 22.2* 23.5*  HGB 8.2* 8.4* 8.5* 8.1*  HCT 24.6* 25.0* 25.6* 24.9*  MCV 87.9 87.1 88.0 88.6  PLT 46* 34* 30* 35*    Medications:    . amiodarone  200 mg Oral Daily  . amLODipine  5 mg Oral Daily  . ciprofloxacin   500 mg Oral Daily  . collagenase   Topical Daily  . darbepoetin (ARANESP) injection - DIALYSIS  200 mcg Intravenous Q Wed-HD  . dexamethasone  3 mg Oral Q12H  . diltiazem  120 mg Oral QHS  . feeding supplement (ENSURE ENLIVE)  237 mL Oral TID BM  . guaiFENesin  600 mg Oral BID  . insulin aspart  0-5 Units Subcutaneous QHS  . insulin aspart  0-9 Units Subcutaneous TID WC  . insulin aspart  2 Units Subcutaneous TID WC  . insulin glargine  12 Units Subcutaneous Daily  . metoprolol tartrate  12.5 mg Oral BID  . mirtazapine  30 mg Oral QHS  . multivitamin  1 tablet Oral QHS      Leland Her, DO PGY-2, [facility] 06/09/2017 7:51 AM

## 2017-06-09 NOTE — Progress Notes (Signed)
Patient has declined the CPAP tonight.  

## 2017-06-09 NOTE — Progress Notes (Signed)
Pt has been refusing care, does not want to be reposition nor turn to release pressure off sacral pressure ulcers.  rAS Rn also stated pt has been refusing meals and ensure drink.  Offered strawberry flavour and pt drunk  200 ml.  Pt looks depressed will continue to encourage po intake.

## 2017-06-09 NOTE — Progress Notes (Signed)
Events in the last few days noted.  Laboratory data were reviewed and platelet counts remain low although stable.  He still responds very well to platelet transfusion and the last transfusion was around May 27, 2017.  Platelet count on May 8 was 35 and does not require any intervention.  The etiology of his thrombocytopenia remains unclear likely related to medications and a consumptive process.  His response to platelet transfusion is appropriate goes against immune thrombocytopenia.  I see no indication for steroid use or IVIG.  He does not require any platelet transfusion or platelet stimulating factor.  I would transfuse platelets if he has active bleeding noted.

## 2017-06-10 DIAGNOSIS — G35 Multiple sclerosis: Secondary | ICD-10-CM

## 2017-06-10 DIAGNOSIS — Z978 Presence of other specified devices: Secondary | ICD-10-CM

## 2017-06-10 DIAGNOSIS — Z8619 Personal history of other infectious and parasitic diseases: Secondary | ICD-10-CM

## 2017-06-10 DIAGNOSIS — L8990 Pressure ulcer of unspecified site, unspecified stage: Secondary | ICD-10-CM

## 2017-06-10 DIAGNOSIS — I10 Essential (primary) hypertension: Secondary | ICD-10-CM

## 2017-06-10 DIAGNOSIS — Z9889 Other specified postprocedural states: Secondary | ICD-10-CM

## 2017-06-10 DIAGNOSIS — D72829 Elevated white blood cell count, unspecified: Secondary | ICD-10-CM

## 2017-06-10 DIAGNOSIS — E11622 Type 2 diabetes mellitus with other skin ulcer: Secondary | ICD-10-CM

## 2017-06-10 DIAGNOSIS — Z8719 Personal history of other diseases of the digestive system: Secondary | ICD-10-CM

## 2017-06-10 DIAGNOSIS — Z8739 Personal history of other diseases of the musculoskeletal system and connective tissue: Secondary | ICD-10-CM

## 2017-06-10 LAB — GLUCOSE, CAPILLARY
GLUCOSE-CAPILLARY: 154 mg/dL — AB (ref 65–99)
Glucose-Capillary: 172 mg/dL — ABNORMAL HIGH (ref 65–99)
Glucose-Capillary: 143 mg/dL — ABNORMAL HIGH (ref 65–99)
Glucose-Capillary: 265 mg/dL — ABNORMAL HIGH (ref 65–99)

## 2017-06-10 LAB — CBC
HEMATOCRIT: 28.5 % — AB (ref 39.0–52.0)
HEMOGLOBIN: 9.5 g/dL — AB (ref 13.0–17.0)
MCH: 29.1 pg (ref 26.0–34.0)
MCHC: 33.3 g/dL (ref 30.0–36.0)
MCV: 87.2 fL (ref 78.0–100.0)
PLATELETS: 29 10*3/uL — AB (ref 150–400)
RBC: 3.27 MIL/uL — AB (ref 4.22–5.81)
RDW: 16.2 % — ABNORMAL HIGH (ref 11.5–15.5)
WBC: 23.2 10*3/uL — AB (ref 4.0–10.5)

## 2017-06-10 LAB — CBC WITH DIFFERENTIAL/PLATELET
BASOS ABS: 0 10*3/uL (ref 0.0–0.1)
BASOS PCT: 0 %
EOS PCT: 0 %
Eosinophils Absolute: 0 10*3/uL (ref 0.0–0.7)
HCT: 23.7 % — ABNORMAL LOW (ref 39.0–52.0)
Hemoglobin: 7.8 g/dL — ABNORMAL LOW (ref 13.0–17.0)
Lymphocytes Relative: 5 %
Lymphs Abs: 1.3 10*3/uL (ref 0.7–4.0)
MCH: 29 pg (ref 26.0–34.0)
MCHC: 32.9 g/dL (ref 30.0–36.0)
MCV: 88.1 fL (ref 78.0–100.0)
Monocytes Absolute: 0.5 10*3/uL (ref 0.1–1.0)
Monocytes Relative: 2 %
NEUTROS PCT: 93 %
Neutro Abs: 22.5 10*3/uL — ABNORMAL HIGH (ref 1.7–7.7)
PLATELETS: 34 10*3/uL — AB (ref 150–400)
RBC: 2.69 MIL/uL — AB (ref 4.22–5.81)
RDW: 16.9 % — ABNORMAL HIGH (ref 11.5–15.5)
WBC: 24.3 10*3/uL — AB (ref 4.0–10.5)

## 2017-06-10 LAB — OSMOLALITY: OSMOLALITY: 318 mosm/kg — AB (ref 275–295)

## 2017-06-10 LAB — RENAL FUNCTION PANEL
ALBUMIN: 1.8 g/dL — AB (ref 3.5–5.0)
ANION GAP: 10 (ref 5–15)
BUN: 64 mg/dL — ABNORMAL HIGH (ref 6–20)
CALCIUM: 7.4 mg/dL — AB (ref 8.9–10.3)
CO2: 27 mmol/L (ref 22–32)
CREATININE: 4.07 mg/dL — AB (ref 0.61–1.24)
Chloride: 106 mmol/L (ref 101–111)
GFR calc non Af Amer: 16 mL/min — ABNORMAL LOW (ref >60)
GFR, EST AFRICAN AMERICAN: 18 mL/min — AB (ref >60)
Glucose, Bld: 166 mg/dL — ABNORMAL HIGH (ref 65–99)
PHOSPHORUS: 4.3 mg/dL (ref 2.5–4.6)
Potassium: 4.7 mmol/L (ref 3.5–5.1)
SODIUM: 143 mmol/L (ref 135–145)

## 2017-06-10 LAB — URINALYSIS, ROUTINE W REFLEX MICROSCOPIC
Bilirubin Urine: NEGATIVE
GLUCOSE, UA: 50 mg/dL — AB
Ketones, ur: NEGATIVE mg/dL
Nitrite: NEGATIVE
PROTEIN: NEGATIVE mg/dL
RBC / HPF: >50 RBC/hpf — ABNORMAL HIGH (ref 0–5)
Specific Gravity, Urine: 1.006 (ref 1.005–1.030)
pH: 7 (ref 5.0–8.0)

## 2017-06-10 LAB — PREPARE RBC (CROSSMATCH)

## 2017-06-10 MED ORDER — SODIUM CHLORIDE 0.9 % IV SOLN
Freq: Once | INTRAVENOUS | Status: AC
  Administered 2017-06-10: 13:00:00 via INTRAVENOUS

## 2017-06-10 MED ORDER — FUROSEMIDE 10 MG/ML IJ SOLN
80.0000 mg | Freq: Once | INTRAMUSCULAR | Status: AC
  Administered 2017-06-10: 80 mg via INTRAVENOUS
  Filled 2017-06-10: qty 8

## 2017-06-10 MED ORDER — DIPHENHYDRAMINE HCL 50 MG/ML IJ SOLN
INTRAMUSCULAR | Status: AC
  Administered 2017-06-10: 50 mg
  Filled 2017-06-10: qty 1

## 2017-06-10 MED ORDER — ORAL CARE MOUTH RINSE
15.0000 mL | Freq: Two times a day (BID) | OROMUCOSAL | Status: DC
  Administered 2017-06-10 (×2): 15 mL via OROMUCOSAL

## 2017-06-10 MED ORDER — DARBEPOETIN ALFA 100 MCG/0.5ML IJ SOSY
100.0000 ug | PREFILLED_SYRINGE | INTRAMUSCULAR | Status: DC
  Filled 2017-06-10 (×2): qty 0.5

## 2017-06-10 MED ORDER — CHLORHEXIDINE GLUCONATE 0.12 % MT SOLN
15.0000 mL | Freq: Two times a day (BID) | OROMUCOSAL | Status: DC
  Administered 2017-06-10: 15 mL via OROMUCOSAL
  Filled 2017-06-10 (×2): qty 15

## 2017-06-10 MED ORDER — FUROSEMIDE 10 MG/ML IJ SOLN: 20.0000 mg | Freq: Once | INTRAMUSCULAR | Status: DC

## 2017-06-10 NOTE — Progress Notes (Signed)
Critical lab received during shift change.  Told Public relations account executive.  Platelets 29000

## 2017-06-10 NOTE — Progress Notes (Signed)
Occupational Therapy Treatment Patient Details Name: Dylan [name] MRN: [id number] DOB: 1965/05/01 Today's Date: 06/10/2017    History of present illness Dylan [name] is a 52yo black male who comes to [facility] on 3/30 with ABD pain and hypoglycemia, admitted with cervical abcess v hematoma s/p C five-seven surgery on . 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 He DC to SNF ~3WA with motor weakness C7 down, full sensation intact. He reports graual worsening of manipulative use with smartphone over those three weaks.  underwent right-sided tunneled catheter placement for hemodialysis and right brachiocephalic AV fistula on 06/01/17   OT comments  Pt had just completed swallow assessment and did not remember it. Pt not oriented to month or situation. Lethargic. Attemptedto move pt inito standing position however, focus of session changed to assisting nursing with rolling and dressing changes on sacral area. Educated nurse and NT on functions of bed, importance of keeping bed on alternating pressure and changing air to "[place]" when in seated position to increase air support in sacral/seated portion of bed. Pt developing R elbow flexion contracture. Received verbal order from MD for elbow splint - will contact ortho/[employer]. Pt with increased coughing this session - able to pull 250 on IS. Will continue to follow.   Follow Up Recommendations  SNF;Supervision/Assistance - 24 hour    Equipment Recommendations  Wheelchair (measurements OT);Wheelchair cushion (measurements OT)    Recommendations for Other Services      Precautions / Restrictions Precautions Precautions: Fall;Cervical Precaution Comments: watch BP; HD catheter RUE; sacral wounds Required Braces or Orthoses: Other Brace/Splint Other Brace/Splint: B resting hand splints for night use  only; B WHO to use PRN during the day Restrictions Weight Bearing Restrictions: No       Mobility Bed Mobility Overal bed mobility: Needs Assistance Bed Mobility: Rolling Rolling: Total assist;+2 for physical assistance         General bed mobility comments: once onto sidelying, used elbow flexor strength to hold self in sidelying position  Transfers                      Balance     Sitting balance-Leahy Scale: Zero Sitting balance - Comments: pillows used as lateral supports in bed                                   ADL either performed or assessed with clinical judgement   ADL Overall ADL's : Needs assistance/impaired Eating/Feeding: Moderate assistance;With adaptive utensils Eating/Feeding Details (indicate cue type and reason): Pt using tenodesis grasp to feed self finger foods                                 Functional mobility during ADLs: Total assistance;+2 for physical assistance General ADL Comments: Bed moved into chair position. Air mattress on alternating pressure in [employer] positioni     Vision       Perception     Praxis      Cognition Arousal/Alertness: Lethargic Behavior During Therapy: Flat affect Overall Cognitive Status: Impaired/Different from baseline Area of Impairment: Orientation;Attention;Memory;Safety/judgement;Awareness;Problem solving                 Orientation Level: Disoriented to;Time;Situation Current Attention Level:  Sustained Memory: Decreased recall of precautions;Decreased short-term memory Following Commands: Follows one step commands with increased time Safety/Judgement: Decreased awareness of safety;Decreased awareness of deficits Awareness: Emergent Problem Solving: Slow processing;Decreased initiation;Difficulty sequencing;Requires verbal cues;Requires tactile cues General Comments: "March"; unable to staet shy he is in the hospital        Exercises General Exercises -  Upper Extremity Shoulder Flexion: AAROM;Both;10 reps Shoulder Extension: AAROM;Both;15 reps;Supine Shoulder ABduction: Both;10 reps Shoulder ADduction: AAROM;Both;15 reps;Supine Shoulder Horizontal ABduction: Both;10 reps Shoulder Horizontal ADduction: Both;10 reps Elbow Flexion: Both;AAROM;5 reps Elbow Extension: AAROM;Both;Supine;5 reps Wrist Flexion: AROM;15 reps;Supine;Both Wrist Extension: Left;PROM;10 reps;Right;Strengthening;15 reps Digit Composite Flexion: PROM;Both;10 reps;Supine Composite Extension: PROM;Both;10 reps;Supine Other Exercises Other Exercises: scapular elevation/depression while tilted at 45% X10 Other Exercises: incentive spirometer x 10(able to pull 250) Other Exercises: hand to mouth pattern RUE Other Exercises: gentle passive stretch of L elbow into extension after gentle soft tissue work @ elbow. Able to achieve extension -20-30 degrees   Shoulder Instructions       General Comments      Pertinent Vitals/ Pain       Pain Assessment: Faces Faces Pain Scale: Hurts even more Pain Location: with BUE P/AAROM, especially L elbow Pain Descriptors / Indicators: Grimacing;Guarding Pain Intervention(s): Limited activity within patient's tolerance  Home Living                                          Prior Functioning/Environment              Frequency  Min 3X/week        Progress Toward Goals  OT Goals(current goals can now be found in the care plan section)  Progress towards OT goals: Not progressing toward goals - comment;OT to reassess next treatment(due to cognitioni)  Acute Rehab OT Goals Patient Stated Goal: to get better OT Goal Formulation: With patient Time For Goal Achievement: 06/10/17 Potential to Achieve Goals: Fair ADL Goals Pt Will Perform Eating: with mod assist;with adaptive utensils;sitting Pt Will Perform Grooming: with mod assist;sitting;with adaptive equipment Pt/caregiver will Perform Home Exercise  Program: Increased ROM;Increased strength;Both right and left upper extremity;With minimal assist Additional ADL Goal #1: Pt will demonstrate ability to self-direct personal care during ADL/mobility completion. Additional ADL Goal #2: Pt will independently direct caregiver on donning/doffing WHO and purpose of splints. Additional ADL Goal #3: Pt will tolerate B resting hand splints to improve functional positioning of hands without complications  Plan Discharge plan remains appropriate    Co-evaluation                 AM-PAC PT "6 Clicks" Daily Activity     Outcome Measure   Help from another person eating meals?: A Lot Help from another person taking care of personal grooming?: A Lot Help from another person toileting, which includes using toliet, bedpan, or urinal?: Total Help from another person bathing (including washing, rinsing, drying)?: Total Help from another person to put on and taking off regular upper body clothing?: Total Help from another person to put on and taking off regular lower body clothing?: Total 6 Click Score: 8    End of Session Equipment Utilized During Treatment: Oxygen  OT Visit Diagnosis: Muscle weakness (generalized) (M62.81);Other symptoms and signs involving the nervous system (R29.898)   Activity Tolerance Patient limited by lethargy   Patient Left in bed;with call bell/phone within reach;with SCD's reapplied(chair position)  Nurse Communication Mobility status;Other (comment)(use of tilt bed in chair position; keep air "on")        Time: 1104-1150 OT Time Calculation (min): 46 min  Charges: OT General Charges $OT Visit: 1 Visit OT Treatments $Self Care/Home Management : 8-22 mins $Therapeutic Activity: 8-22 mins $Therapeutic Exercise: 8-22 mins  [facility], OT/L  [phone] 06/10/2017   Lejon Afzal,HILLARY 06/10/2017, 12:11 PM

## 2017-06-10 NOTE — Procedures (Signed)
Objective Swallowing Evaluation: Type of Study: FEES-Fiberoptic Endoscopic Evaluation of Swallow   Patient Details  Name: Dylan [name] MRN: [id number] Date of Birth: [birth year]  Today's Date: 06/10/2017 Time: SLP Start Time (ACUTE ONLY): 0956 -SLP Stop Time (ACUTE ONLY): 1016  SLP Time Calculation (min) (ACUTE ONLY): 20 min   Past Medical History:  Past Medical History:  Diagnosis Date  . 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 Diabetes mellitus without complication (HCC)   . Hypertension   . MS (congenital mitral stenosis) 2019  . Persistent atrial fibrillation with rapid ventricular response (HCC) 03/27/2017   Past Surgical History:  Past Surgical History:  Procedure Laterality Date  . ANTERIOR CERVICAL DECOMP/DISCECTOMY FUSION N/A 03/25/2017   Procedure: ANTERIOR CERVICAL DECOMPRESSION/DISCECTOMY FUSION CERVICAL FIVE- CERVICAL SIX, CERVICAL SIX- CERVICAL SEVEN, CORPECTOMY;  Surgeon: Dylan Renshaw, MD;  Location: [facility] OR;  Service: Neurosurgery;  Laterality: N/A;  . AV FISTULA PLACEMENT Right 05/03/2017   Procedure: ARTERIOVENOUS (AV) FISTULA CREATION RIGHT;  Surgeon: Dylan Hint, MD;  Location: [facility] OR;  Service: Vascular;  Laterality: Right;  . INSERTION OF DIALYSIS CATHETER Right 05/28/2017   Procedure: INSERTION OF DIALYSIS CATHETER;  Surgeon: Dylan Hint, MD;  Location: [facility] OR;  Service: Vascular;  Laterality: Right;  . IR FLUORO GUIDE CV LINE RIGHT  05/07/2017  . IR US GUIDE VASC ACCESS RIGHT  05/07/2017  . NOSE SURGERY     AS A CHILD   HPI: Dylan [name] 52 yo admitted on 3/30 with ABD pain and hypoglycemia with cervical abcess v hematoma s/p C five-seven surgery. Pt was having progressive motor loss and per chart, noted to have both severe spinal cord compression as well as MS legions in CNS, Underwent ACDF  operation C5-7, with contiued radicular compromise 03/2017. Per chart pt discharged to SNF with motor weakness, full sensation intact with gradual  worsening of manual dexterity using smartphone.  Underwent right-sided tunneled catheter placement for hemodialysis and right brachiocephalic AV fistula on 06/01/17. CXR CHF with interstitial edema and bilateral pleural effusions. The findings are slightly more conspicuous today. BSE recommended FEES (see report).    No data recorded   Assessment / Plan / Recommendation  CHL IP CLINICAL IMPRESSIONS 06/10/2017  Clinical Impression Endoscope was painful for pt likely due to significant candidias on base of tongue as wall as surface of tongue. Bilateral arytenoids mobile, vocal cord adducted during non swallow tasks with slight incomplete closure but overall functional. No significant edema. Oral manipulation functional with mildly decreased cohesion. Small portion of thin via straw penetrated the vestibule before the swallow and not observed in trachea when pt asked to cough (? flash). He continues to be at risk if he penetrates/aspirates due to weak cough. Swallow initiated at the valleculae and pyriform sinuses and mild vallecular/pyriform sinus residue. Recommend continue regular texture, thin liquids, straws allowed with small sips, 1-2 pills with thin, sit upright. Recommend magic mouthwash and placed pt on high risk oral care protocol.     SLP Visit Diagnosis Dysphagia, pharyngeal phase (R13.13)  Attention and concentration deficit following --  Frontal lobe and executive function deficit following --  Impact on safety and function Moderate aspiration risk      CHL IP TREATMENT RECOMMENDATION 06/10/2017  Treatment Recommendations Therapy as outlined in treatment plan below     Prognosis 06/10/2017  Prognosis for Safe Diet Advancement (No Data)  Barriers to Reach Goals --  Barriers/Prognosis Comment --    CHL IP DIET RECOMMENDATION 06/10/2017  SLP Diet  Recommendations Regular solids;Thin liquid  Liquid Administration via Straw;Cup  Medication Administration Whole meds with liquid  Compensations  Slow rate;Small sips/bites;Multiple dry swallows after each bite/sip  Postural Changes Seated upright at 90 degrees      CHL IP OTHER RECOMMENDATIONS 06/10/2017  Recommended Consults --  Oral Care Recommendations Oral care QID  Other Recommendations --      CHL IP FOLLOW UP RECOMMENDATIONS 06/10/2017  Follow up Recommendations Skilled Nursing facility      [facility] IP FREQUENCY AND DURATION 06/10/2017  Speech Therapy Frequency (ACUTE ONLY) min 2x/week  Treatment Duration 2 weeks           CHL IP ORAL PHASE 06/10/2017  Oral Phase Impaired  Oral - Pudding Teaspoon --  Oral - Pudding Cup --  Oral - Honey Teaspoon --  Oral - Honey Cup Decreased bolus cohesion  Oral - Nectar Teaspoon --  Oral - Nectar Cup Decreased bolus cohesion  Oral - Nectar Straw --  Oral - Thin Teaspoon --  Oral - Thin Cup Decreased bolus cohesion  Oral - Thin Straw Decreased bolus cohesion  Oral - Puree --  Oral - Mech Soft --  Oral - Regular --  Oral - Multi-Consistency --  Oral - Pill --  Oral Phase - Comment --    CHL IP PHARYNGEAL PHASE 06/10/2017  Pharyngeal Phase Impaired  Pharyngeal- Pudding Teaspoon --  Pharyngeal --  Pharyngeal- Pudding Cup --  Pharyngeal --  Pharyngeal- Honey Teaspoon --  Pharyngeal --  Pharyngeal- Honey Cup Delayed swallow initiation-vallecula;Pharyngeal residue - valleculae;Pharyngeal residue - pyriform  Pharyngeal --  Pharyngeal- Nectar Teaspoon Delayed swallow initiation-pyriform sinuses;Pharyngeal residue - valleculae;Pharyngeal residue - pyriform  Pharyngeal --  Pharyngeal- Nectar Cup Pharyngeal residue - valleculae;Pharyngeal residue - pyriform  Pharyngeal --  Pharyngeal- Nectar Straw --  Pharyngeal --  Pharyngeal- Thin Teaspoon Delayed swallow initiation-pyriform sinuses  Pharyngeal --  Pharyngeal- Thin Cup Delayed swallow initiation-pyriform sinuses  Pharyngeal --  Pharyngeal- Thin Straw Penetration/Aspiration before swallow  Pharyngeal Other (Comment)  Pharyngeal-  Puree NT  Pharyngeal --  Pharyngeal- Mechanical Soft NT  Pharyngeal --  Pharyngeal- Regular WFL  Pharyngeal Material does not enter airway  Pharyngeal- Multi-consistency NT  Pharyngeal --  Pharyngeal- Pill NT  Pharyngeal --  Pharyngeal Comment --     CHL IP CERVICAL ESOPHAGEAL PHASE 06/10/2017  Cervical Esophageal Phase WFL  Pudding Teaspoon --  Pudding Cup --  Honey Teaspoon --  Honey Cup --  Nectar Teaspoon --  Nectar Cup --  Nectar Straw --  Thin Teaspoon --  Thin Cup --  Thin Straw --  Puree --  Mechanical Soft --  Regular --  Multi-consistency --  Pill --  Cervical Esophageal Comment --    No flowsheet data found.  Dylan [name] 06/10/2017, 10:53 AM  Dylan [name].Ed [employer] [phone]

## 2017-06-10 NOTE — Progress Notes (Signed)
CRITICAL VALUE ALERT  Critical Value:[place]  Date & Time Notied:  May 9, 19 At approx. 1915  Provider Notified: Paged [facility]  Orders Received/Actions taken: No orders given.

## 2017-06-10 NOTE — Progress Notes (Addendum)
INFECTIOUS DISEASE PROGRESS NOTE  ID: Dylan [name] is a 52 y.o. male with  Principal Problem:   AKI (acute kidney injury) ([place]) Active Problems:   Uncontrolled diabetes mellitus type 2 without complications (HCC)   Hypertension   Morbid obesity (HCC)   Paroxysmal atrial fibrillation (HCC)   Multiple sclerosis exacerbation (HCC)   Pressure injury of skin   Hyperkalemia   Hematoma   Insomnia   Adjustment reaction with anxiety and depression   Quadriplegia ([place])   History of fusion of cervical spine   Postoperative wound infection   Coronary artery disease   Atherosclerotic peripheral vascular disease (HCC)   MDD (major depressive disorder), single episode, moderate (HCC)   Candidemia (HCC)   Goals of care, counseling/discussion   Palliative care by specialist   Acute lower UTI  Subjective: 52 y.o. male with hx of DM2, HTN, newly dx multiple sclerosis, adm on 2-17 with worsening weakness. He was found to have C5-7 cord compression. He underwent C6 decompression on 2-21. He then developed fever and increased WBC and was started on vanco/zosyn. A f/u MRI (2-28) of his spine showed hematoma or abscess at his previous surgical site. He was not felt to be a surgical candidate and was treated empirically with vanco/cefepime (end date 05-16-17) after being seen by ID on 3-2/3.  He was d/c to SNF on 3-8.  His course was concerning for vanco ADR (worsening renal function) and his vanco was changed to dapto. He also had acute urinary retention. He was found 4-4 to have fungemia (C glabrata) and was treated with eraxis (suspected to be due to [facility] line. End 4-12). His course was also complicated by thrombocytopenia (assumed to be due to anbx). His course was complicated by hematochezia requiring blood transfusion.  He has to stay on HD and has had HD line (4-29), as well as AV fistula placed on 4-30.  5-2 he was started on Cipro for polymicrobial UTI (E coli, Pseudomonas, S aureus). He has  been noted to have mild delerium from his hospitalization.   On 5-6 his WBC began to increase (18.8 <--- 9.9). He has been afebrile.  By 5-8 he developed cough and was felt to have CHF on his CXR.  Today his WBC had increased to 24.3.  He is afebrile. He is having normal BM per nursing. His urine is most likely colonized by chronic foley.  He has an upper airway cough.   Abtx:  Anti-infectives (From admission, onward)   Start     Dose/Rate Route Frequency Ordered Stop   06/04/17 1800  ciprofloxacin (CIPRO) tablet 500 mg     500 mg Oral Daily 06/04/17 1322     06/03/17 1045  ciprofloxacin (CIPRO) tablet 500 mg  Status:  Discontinued     500 mg Oral Daily with breakfast 06/03/17 1035 06/04/17 1322   06/02/17 1345  ceFEPIme (MAXIPIME) 1 g in sodium chloride 0.9 % 100 mL IVPB  Status:  Discontinued     1 g 200 mL/hr over 30 Minutes Intravenous Every 24 hours 06/02/17 1342 06/03/17 1034   05/21/2017 0818  sodium chloride 0.9 % with cefUROXime (ZINACEF) ADS Med    Note to Pharmacy:  [name]   : cabinet override      05/25/2017 0818 05/18/2017 1005   05/14/2017 0600  cefUROXime (ZINACEF) 1.5 g in sodium chloride 0.9 % 100 mL IVPB  Status:  Discontinued     1.5 g 200 mL/hr over 30 Minutes Intravenous On  call to O.R. 05/28/17 0729 05/28/17 1244   05/12/2017 0600  cefUROXime (ZINACEF) 1.5 g in sodium chloride 0.9 % 100 mL IVPB     1.5 g 200 mL/hr over 30 Minutes Intravenous To ShortStay Surgical 05/28/17 1244 05/15/2017 2200   05/29/17 1300  cefTRIAXone (ROCEPHIN) 1 g in sodium chloride 0.9 % 100 mL IVPB  Status:  Discontinued     1 g 200 mL/hr over 30 Minutes Intravenous Every 24 hours 05/29/17 1223 06/02/17 1242   05/28/17 0600  cefUROXime (ZINACEF) 1.5 g in sodium chloride 0.9 % 100 mL IVPB     1.5 g 200 mL/hr over 30 Minutes Intravenous To ShortStay Surgical 05/27/17 1505 05/29/17 0600   05/08/17 2000  DAPTOmycin (CUBICIN) 900 mg in sodium chloride 0.9 % IVPB     900 mg 236 mL/hr over 30  Minutes Intravenous Every 48 hours 05/07/17 1114 05/16/17 2230   05/05/17 2000  anidulafungin (ERAXIS) 100 mg in sodium chloride 0.9 % 100 mL IVPB  Status:  Discontinued     100 mg 78 mL/hr over 100 Minutes Intravenous Every 24 hours 05/04/17 1908 05/14/17 1751   05/04/17 2000  anidulafungin (ERAXIS) 200 mg in sodium chloride 0.9 % 200 mL IVPB     200 mg 78 mL/hr over 200 Minutes Intravenous  Once 05/04/17 1908 05/05/17 0000   05/02/17 1200  DAPTOmycin (CUBICIN) 1,000 mg in sodium chloride 0.9 % IVPB  Status:  Discontinued     1,000 mg 240 mL/hr over 30 Minutes Intravenous Every 48 hours 05/02/17 1143 05/07/17 1114   04/10/2017 1900  ceFEPIme (MAXIPIME) 1 g in sodium chloride 0.9 % 100 mL IVPB     1 g 200 mL/hr over 30 Minutes Intravenous Daily after supper 04/30/2017 1833 05/16/17 1850   04/25/2017 1800  ceFEPime (MAXIPIME) IVPB  Status:  Discontinued    Note to Pharmacy:  Indication:  Spinal abscess Last Day of Therapy:  05/16/2017 Labs - Once weekly:  CBC/D and BMP, Labs - Every other week:  ESR and CRP     2 g Intravenous Every 12 hours 04/24/2017 1757 04/10/2017 1833   04/06/2017 1245  vancomycin (VANCOCIN) 2,000 mg in sodium chloride 0.9 % 500 mL IVPB     2,000 mg 250 mL/hr over 120 Minutes Intravenous  Once 04/11/2017 1231 04/17/2017 1757   04/11/2017 1230  piperacillin-tazobactam (ZOSYN) IVPB 3.375 g     3.375 g 100 mL/hr over 30 Minutes Intravenous  Once 04/06/2017 1217 04/11/2017 1407   04/28/2017 1230  vancomycin (VANCOCIN) IVPB 1000 mg/200 mL premix  Status:  Discontinued     1,000 mg 200 mL/hr over 60 Minutes Intravenous  Once 04/21/2017 1217 04/28/2017 1231      Medications:  Scheduled: . amiodarone  200 mg Oral Daily  . amLODipine  5 mg Oral Daily  . chlorhexidine  15 mL Mouth Rinse BID  . collagenase   Topical Daily  . darbepoetin (ARANESP) injection - NON-DIALYSIS  100 mcg Subcutaneous Q Thu-1800  . dexamethasone  3 mg Oral Q12H  . diltiazem  120 mg Oral QHS  . feeding supplement (ENSURE  ENLIVE)  237 mL Oral TID BM  . guaiFENesin  600 mg Oral BID  . insulin aspart  0-5 Units Subcutaneous QHS  . insulin aspart  0-9 Units Subcutaneous TID WC  . insulin aspart  2 Units Subcutaneous TID WC  . insulin glargine  12 Units Subcutaneous Daily  . mouth rinse  15 mL Mouth Rinse q12n4p  . metoprolol  tartrate  12.5 mg Oral BID  . mirtazapine  30 mg Oral QHS  . multivitamin  1 tablet Oral QHS    Objective: Vital signs in last 24 hours: Temp:  [97.6 F (36.4 C)-97.8 F (36.6 C)] 97.6 F (36.4 C) (05/09 0849) Pulse Rate:  [73-91] 80 (05/09 0849) Resp:  [18-22] 18 (05/09 0849) BP: (122-180)/(75-97) 180/90 (05/09 0849) SpO2:  [98 %-100 %] 98 % (05/09 0849) Weight:  [115.7 kg (255 lb)] 115.7 kg (255 lb) (05/09 0342)   General appearance: alert, cooperative and no distress Throat: normal findings: oropharynx pink & moist without lesions or evidence of thrush Neck: no adenopathy and supple, symmetrical, trachea midline Resp: clear to auscultation bilaterally Chest wall: no tenderness, no d/c from HD line site, non-tender.  Cardio: regular rate and rhythm GI: normal findings: bowel sounds normal and soft, non-tender Extremities: edema in BLE and RUE AVF is clean, non-tender. +thrill.   Lab Results Recent Labs    06/09/17 0543 06/10/17 0502  WBC 23.5* 24.3*  HGB 8.1* 7.8*  HCT 24.9* 23.7*  NA 143 143  K 4.7 4.7  CL 106 106  CO2 26 27  BUN 61* 64*  CREATININE 3.99* 4.07*   Liver Panel Recent Labs    06/09/17 0543 06/10/17 0502  ALBUMIN 1.8* 1.8*   Sedimentation Rate No results for input(s): ESRSEDRATE in the last 72 hours. C-Reactive Protein No results for input(s): CRP in the last 72 hours.  Microbiology: No results found for this or any previous visit (from the past 240 hour(s)).  Studies/Results: Dg Chest Port 1 View  Result Date: 06/09/2017 CLINICAL DATA:  Cough. History of atrial fibrillation, mitral stenosis, diabetes, multiple scleroses, acute kidney  injury. EXAM: PORTABLE CHEST 1 VIEW COMPARISON:  Chest x-ray of May 31, 2017 FINDINGS: The lungs are reasonably well inflated. The interstitial markings are increased. There is a moderate-sized right pleural effusion and trace left pleural effusion. The cardiac silhouette is enlarged. There is calcification in the wall of the aortic arch. The dialysis catheter tip projects at the cavoatrial junction. The bony thorax exhibits no acute abnormality. IMPRESSION: CHF with interstitial edema and bilateral pleural effusions. The findings are slightly more conspicuous today. Thoracic aortic atherosclerosis. Electronically Signed   By: David  Martinique M.D.   On: 06/09/2017 13:40     Assessment/Plan: MS Leukocytosis Decubitus ulcers Prev Hematoma/abscess at C6 decompression site Prev fungemia Prev GI bleed.   Total days of antibiotics: 5-3 Cipro  Would stop cipro (more likely to cause ADR that to cure his bladder colonization) Follow WBC Could this be from his decadron? Or aranesp (would be very uncommon)? Follow up BCx.  Please remove his R chest catheter.  Check LE dopplers.  Evaluate his source of bleeding/blood loss. His transfusions could cause leukocytosis.  Could consider his head C6 MRI but he does not have pain, limitation of mvmt or fever.           Bobby Rumpf MD, FACP Infectious Diseases (pager) [phone] [email or web address] 06/10/2017, 1:46 PM  LOS: 40 days

## 2017-06-10 NOTE — Progress Notes (Signed)
PROGRESS NOTE  Dylan [name] ZOX:[id number] DOB: Mar 09, 1965 DOA: 04/20/2017 PCP: Patient, No Pcp Per   LOS: 40 days   Brief Narrative / Interim history: Patient is a 52 year old male with past medical history significant for recent quadriplegia, multiple sclerosis, decompression of the spinal abscess, spinal stenosis who has been  admitted for the management of acute kidney injury.  He was recently admitted to neurosurgery on 04/12/2017 and had surgery(corpectomy/decompression surgery) on 03/25/17 for cord compression due to spinal stenosis at C6 ,C5 and C7.  Postoperative course was completed with atrial fibrillation, started on Eliquis but discontinued due to spinal hematoma. Also epidural clot versus abscess was suspected on his follow up  C-spine MRI. Because of the high risk patient decided not to repeat exploratory surgery.  He was also started on IV antibiotics due to complicated UTI, possible cervical abscess and was discharged to skilled nursing facility on 04/09/2017.   His Foley catheter was not draining at the skilled nursing facility so he was sent to the emergency department.  Found to have acute kidney injury. Urology was also consulted, flushed his Foley with removal of foreign body yeilding 2.5 L of urine. Currently patient has been managed for acute kidney injury thought to be secondary to possible vancomycin related versus obstructive nephropathy.  Nephrology following.  Patient hospital course is also complicated with sepsis, Candida glabrata fungal,  anemia, progressive oliguric renal failure, hyperkalemia , thrombocytopenia. Patient underwent right-sided tunneled catheter placement for hemodialysis and right brachiocephalic AV fistula on 06/01/17. His disposition will be challenging.Currently he may not be able to sit for dialysis due to ulcers on his buttocks and quadriplegia.  He might need SNF with dialysis facility out of state.  Social worker following.  Nephrology stopped  dialysis about 7 days ago given the fact that patient has started to make more urine.  Now we are now awaiting pattern to see whether he will need further dialysis.  He had difficulties with his Foley catheter not being able to drain properly, requiring upgrade from 16 Fr to 18 and ultimately to 20 [place].  Urology consulted on 5/6 and recommended that the catheter needs to be tension-free and set to gravity drainage as well as patient to be positioned such a way to provide adequate catheter drainage.  If patient will not need dialysis anymore --Per nephrology -- may be able to go to SNF  Assessment & Plan: Principal Problem:   AKI (acute kidney injury) (HCC) Active Problems:   Uncontrolled diabetes mellitus type 2 without complications (HCC)   Hypertension   Morbid obesity (HCC)   Paroxysmal atrial fibrillation (HCC)   Multiple sclerosis exacerbation (HCC)   Pressure injury of skin   Hyperkalemia   Hematoma   Insomnia   Adjustment reaction with anxiety and depression   Quadriplegia (HCC)   History of fusion of cervical spine   Postoperative wound infection   Coronary artery disease   Atherosclerotic peripheral vascular disease (HCC)   MDD (major depressive disorder), single episode, moderate (HCC)   Candidemia (HCC)   Goals of care, counseling/discussion   Palliative care by specialist   Acute lower UTI   Acute renal failure /bladder outlet obstruction -Thought to be secondary to obstructive uropathy versus vancomycin-induced renal injury.  Nephrology is following.  He has been started on dialysis since 05/07/2017. Permanent  dialysis catheter placed on 06-06-17.  -There is also concern that patient might need stretcher dialysis which would be possible only out of state. S/P right brachiocephalic AV  fistula per vascular surgeon -16 French Foley with decreased output on 5/4, Foley exchanged for an 77 [place] on 5/4 and 20 Fr on 5/5. Still with poor UOP, seems to be retaining based on  bladder scan. -consulted urology, recommended that the catheter needs to be tension-free and set to gravity drainage as well as patient to be positioned such a way to provide adequate catheter drainage -Now his dialysis is on hold, nephrology following and awaiting to see whether he will recover renal function or he will further need dialysis.  This will determine patient's disposition Foley draining. Urine out put 1.4  Cough;  Episode of cough morning 5-08 Chest x ray consistent with CHF. Oxygen sat remain stable.  Speech swallow evaluation.   Acute encephalopathy -also with a UTI. Treated based on sensitivities -Obtained an MRI of the brain which showed mild diffusion abnormality about lesions within the bilateral occipital lobes and left thalamus which may reflect mild acute versus subacute active demyelination changes -Neurology was consulted and recommended to continue supportive treatment, continue to treat UTI but no further interventions currently.  Mental status at baseline.   Severe thrombocytopenia  -Thought to be secondary to drug related.  Anidulafungin, daptomycin and cefepime stopped.  Protonix also stopped.  HIT negative.  Patient does not receive heparin with dialysis.  Hematology was also consulted .He has received several units of platelets transfusion. Hematology feels that this is not likely immune medicine thrombocytopenia given patient response to transfusion.  -Dr. Clelia Croft following intermittently -platelet stable.   GI bleed/acute blood loss anemia/acute on chronic anemia  -Patient had an episode of hematochezia, GI was consulted, his bleeding has self resolved and had no further bleeding. Recommending continue close monitoring.  Recommended to reconsult if rebleeding.  No new episodes of hematochezia . -GI reluctant to do any kind of procedures unless his platelets are more than 50,000. -Hb decreased to 7.8. Will transfuse one unit due to thrombocytopenia.    Elevated troponin  -Mild.  Did not trend up.  Patient denies any chest pain.  Likely supply demand ischemia  Quadriplegia/cervical abscess versus cervical hematoma/infected cervical hematoma  -Patient completed 6 weeks of antibiotics including 4 weeks of vancomycin and cefepime followed by 2 weeks of daptomycin and cefepime.  Course completed on 05/16/2017. -Neurosurgery was following before. ID was following, signed off. -Neurosurgery recommended steroids when he was first diagnosed in March, and a very slow taper, have change Decadron to 3 times daily to twice daily on 5/5  Neurogenic bladder  -Patient was discharged with Foley catheter on 04/09/2017 in the context of new quadriplegia. -Foley catheter exhanged on admission. Foley was taken out during this admission but had to be put back again due to urinary retention.  Continue Foley catheter.  See discussion above about upsizing his catheter and urology consultation on 5/6  UTI:  -Urine culture growing Ecoli, staph as well as Pseudomonas. Pseudomonas is resistant to Cefepime. Now on  Cipro, day 7 cipro discontinue.   Leukocytosis;  On cipro for UTI.  Afebrile.  Chest xray 5-08 negative for PNA.  Will check UA, urine culture, blood culture.  ID consulted. Recommend doppler and to stop cipro.   Candidemia  -Patient had 10 days of  Anidulafungin which has been discontinued due to thrombocytopenia. -Ophthalmology also consulted, no evidence of intraocular fungal infection.  Echocardiogram did not show any vegetation.  Diabetes type 2  -Continue Lantus, sliding scale insulin.  -CBGs 120s to 160s keep on current regimen  Paroxysmal A.  Fib -No anticoagulation given his current thrombocytopenia and concern for postop hematoma to his C-spine.   -Continue amiodarone, metoprolol and Cardizem. -sinus on telemetry   Multiple sclerosis  -Recently diagnosed on previous admission and he was given 5 days of high-dose steroids with  improvement.  Neurology was consulted at that time.  -MRI obtained on 5/3 as described above, neurology was consulted on 5/4, appreciate input  Adjustment disorder/depression  -Continue mirtazapine.  Psychiatry was following.  Recommended trazodone.  Hyponatremia/hypokalemia  -Currently stable  Unstageable pressure injury on bilateral buttocks  -Continue supportive care.  Continue wound care.  His pressure injury on bilateral buttocks is making him unable to sit for dialysis.  Nocturnal hypoxia  -No history of OSA or the use of CPAP.  Continue supplemental oxygen as needed/CPAP at night.  Goals of care  -Palliative care was consulted before.  Remains full code    DVT prophylaxis: SCD Code Status: Full code Family Communication: d/w extensive family members 5/2 Disposition Plan: Skilled nursing facility after nephrology clearance and finding of skilled nursing facility with stretcher dialysis facility if HD is needed  Consultants:   Nephrology   Hematology   Neurology  Psychiatry   ID  Ophthalmology  GI  Procedures:  Echocardiogram, upper external vein mapping, HD  Antimicrobials:  Ciprofloxacin 5/2 >> Prior antibiotics include daptomycin, Eraxis, cefepime, vancomycin, Zosyn  Subjective: He is sleepy. Would answer few questions.    Objective: Vitals:   06/09/17 2328 06/10/17 0342 06/10/17 0342 06/10/17 0849  BP: (!) 169/83  (!) 180/97 (!) 180/90  Pulse: 78  74 80  Resp: 20  (!) 22 18  Temp: 97.6 F (36.4 C)  97.7 F (36.5 C) 97.6 F (36.4 C)  TempSrc: Oral  Oral Axillary  SpO2: 100%  100% 98%  Weight:  115.7 kg (255 lb)    Height:        Intake/Output Summary (Last 24 hours) at 06/10/2017 1139 Last data filed at 06/10/2017 0630 Gross per 24 hour  Intake 450 ml  Output 1425 ml  Net -975 ml   Filed Weights   06/08/17 1100 06/09/17 0403 06/10/17 0342  Weight: 110 kg (242 lb 8 oz) 109.8 kg (242 lb 1 oz) 115.7 kg (255 lb)     Examination:  Constitutional: No acute distress.  Respiratory: Normal respiratory effort, few ronchus Cardiovascular:  S 1, S 2 RRR Abdomen: Soft, nt Skin: no rash  Neurologic: Quadriplegic.    Data Reviewed: I have independently reviewed following labs and imaging studies   CBC: Recent Labs  Lab 06/05/17 0359 06/07/17 0309 06/08/17 0744 06/09/17 0543 06/10/17 0502  WBC 9.9 18.6* 22.2* 23.5* 24.3*  NEUTROABS  --   --   --   --  22.5*  HGB 8.2* 8.4* 8.5* 8.1* 7.8*  HCT 24.6* 25.0* 25.6* 24.9* 23.7*  MCV 87.9 87.1 88.0 88.6 88.1  PLT 46* 34* 30* 35* 34*   Basic Metabolic Panel: Recent Labs  Lab 06/06/17 0943 06/07/17 0303 06/08/17 0744 06/09/17 0543 06/10/17 0502  NA 139 139 141 143 143  K 5.0 4.5 4.4 4.7 4.7  CL 103 103 104 106 106  CO2 24 23 25 26 27   GLUCOSE 241* 83 142* 222* 166*  BUN 48* 54* 59* 61* 64*  CREATININE 3.48* 3.57* 3.87* 3.99* 4.07*  CALCIUM 7.0* 7.1* 7.2* 7.5* 7.4*  PHOS 4.2 3.9 4.8* 4.7* 4.3   GFR: Estimated Creatinine Clearance: 30.2 mL/min (A) (by C-G formula based on SCr of 4.07 mg/dL (H)).  Liver Function Tests: Recent Labs  Lab 06/05/17 0458 06/06/17 0943 06/07/17 0303 06/08/17 0744 06/09/17 0543 06/10/17 0502  AST 23  --   --   --   --   --   ALT 56  --   --   --   --   --   ALKPHOS 115  --   --   --   --   --   BILITOT 1.1  --   --   --   --   --   PROT 5.3*  --   --   --   --   --   ALBUMIN 1.8* 1.8* 1.8* 1.8* 1.8* 1.8*   No results for input(s): LIPASE, AMYLASE in the last 168 hours. Recent Labs  Lab 06/06/17 1454  AMMONIA 29   Coagulation Profile: No results for input(s): INR, PROTIME in the last 168 hours. Cardiac Enzymes: No results for input(s): CKTOTAL, CKMB, CKMBINDEX, TROPONINI in the last 168 hours. BNP (last 3 results) No results for input(s): PROBNP in the last 8760 hours. HbA1C: No results for input(s): HGBA1C in the last 72 hours. CBG: Recent Labs  Lab 06/09/17 0612 06/09/17 1220 06/09/17 1629  06/09/17 2139 06/10/17 0610  GLUCAP 214* 212* 126* 143* 172*   Lipid Profile: No results for input(s): CHOL, HDL, LDLCALC, TRIG, CHOLHDL, LDLDIRECT in the last 72 hours. Thyroid Function Tests: No results for input(s): TSH, T4TOTAL, FREET4, T3FREE, THYROIDAB in the last 72 hours. Anemia Panel: No results for input(s): VITAMINB12, FOLATE, FERRITIN, TIBC, IRON, RETICCTPCT in the last 72 hours. Urine analysis:    Component Value Date/Time   COLORURINE YELLOW 05/29/2017 0340   APPEARANCEUR CLOUDY (A) 05/29/2017 0340   LABSPEC 1.009 05/29/2017 0340   PHURINE 6.0 05/29/2017 0340   GLUCOSEU NEGATIVE 05/29/2017 0340   HGBUR MODERATE (A) 05/29/2017 0340   BILIRUBINUR NEGATIVE 05/29/2017 0340   KETONESUR NEGATIVE 05/29/2017 0340   PROTEINUR 100 (A) 05/29/2017 0340   NITRITE NEGATIVE 05/29/2017 0340   LEUKOCYTESUR LARGE (A) 05/29/2017 0340   Sepsis Labs: Invalid input(s): PROCALCITONIN, LACTICIDVEN  No results found for this or any previous visit (from the past 240 hour(s)).    Radiology Studies: Dg Chest Port 1 View  Result Date: 06/09/2017 CLINICAL DATA:  Cough. History of atrial fibrillation, mitral stenosis, diabetes, multiple scleroses, acute kidney injury. EXAM: PORTABLE CHEST 1 VIEW COMPARISON:  Chest x-ray of May 31, 2017 FINDINGS: The lungs are reasonably well inflated. The interstitial markings are increased. There is a moderate-sized right pleural effusion and trace left pleural effusion. The cardiac silhouette is enlarged. There is calcification in the wall of the aortic arch. The dialysis catheter tip projects at the cavoatrial junction. The bony thorax exhibits no acute abnormality. IMPRESSION: CHF with interstitial edema and bilateral pleural effusions. The findings are slightly more conspicuous today. Thoracic aortic atherosclerosis. Electronically Signed   By: David  [place] M.D.   On: 06/09/2017 13:40     Scheduled Meds: . amiodarone  200 mg Oral Daily  . amLODipine  5  mg Oral Daily  . chlorhexidine  15 mL Mouth Rinse BID  . ciprofloxacin  500 mg Oral Daily  . collagenase   Topical Daily  . darbepoetin (ARANESP) injection - NON-DIALYSIS  100 mcg Subcutaneous Q Thu-1800  . dexamethasone  3 mg Oral Q12H  . diltiazem  120 mg Oral QHS  . feeding supplement (ENSURE ENLIVE)  237 mL Oral TID BM  . furosemide  80 mg Intravenous Once  . guaiFENesin  600 mg Oral BID  . insulin aspart  0-5 Units Subcutaneous QHS  . insulin aspart  0-9 Units Subcutaneous TID WC  . insulin aspart  2 Units Subcutaneous TID WC  . insulin glargine  12 Units Subcutaneous Daily  . mouth rinse  15 mL Mouth Rinse q12n4p  . metoprolol tartrate  12.5 mg Oral BID  . mirtazapine  30 mg Oral QHS  . multivitamin  1 tablet Oral QHS   Continuous Infusions: . sodium chloride    . sodium chloride    . sodium chloride    . sodium chloride     Hartley Barefoot, MD [facility] Pager [phone]  If 7PM-7AM, please contact night-coverage [email or web address] Password [email or web address] 06/10/2017, 11:39 AM

## 2017-06-10 NOTE — Consult Note (Signed)
WOC Nurse wound consult note Reason for Consult: re-consulted for worsening sacral/buttock pressure injury Wound type: Previously seen by [facility] nurse 05/27/17, at which time there were 3 wounds noted. At this time the areas have merged into one area that is  15cm x 17.5cm x 0.2cm  Pressure Injury POA: Yes Measurement:see above Wound bed:75% black/soft eschar centrally 25% pink with epithelial budding but yellow base Drainage (amount, consistency, odor) moderate, no odor, non purulent Periwound: intact  Noted patient has abrasion like areas over his back/shoulders/flank areas skin is intact but purplish red. Unclear etiology and if they have been present since his admission. Dressing procedure/placement/frequency: Add PT for hydrotherapy for mechanical debridement. Feel that centrally this wound may be quite deep. May at some point need diverting ostomy. Continue enzymatic debridement, however a slow process.  Manage moisture, patient has urinary catheter Pressure redistribution/moisture management with LALM, patient on Total Lift bed at this time.  WOC Nurse team will follow along with you for weekly wound assessments.  Please notify me of any acute changes in the wounds or any new areas of concerns Dylan [name] [facility] MSN, RN,CWOCN, CNS, CWON-AP [phone]

## 2017-06-10 NOTE — Plan of Care (Signed)
  Problem: Education: Goal: Knowledge of General Education information will improve Outcome: Progressing   Problem: Health Behavior/Discharge Planning: Goal: Ability to manage health-related needs will improve Outcome: Progressing   Problem: Clinical Measurements: Goal: Ability to maintain clinical measurements within normal limits will improve Outcome: Progressing Goal: Will remain free from infection Outcome: Progressing Goal: Diagnostic test results will improve Outcome: Progressing Goal: Respiratory complications will improve Outcome: Progressing Goal: Cardiovascular complication will be avoided Outcome: Progressing   Problem: Activity: Goal: Risk for activity intolerance will decrease Outcome: Progressing   Problem: Nutrition: Goal: Adequate nutrition will be maintained Outcome: Progressing   Problem: Coping: Goal: Level of anxiety will decrease Outcome: Progressing   Problem: Elimination: Goal: Will not experience complications related to bowel motility Outcome: Progressing Goal: Will not experience complications related to urinary retention Outcome: Progressing   Problem: Pain Managment: Goal: General experience of comfort will improve Outcome: Progressing   Problem: Safety: Goal: Ability to remain free from injury will improve Outcome: Progressing   Problem: Skin Integrity: Goal: Risk for impaired skin integrity will decrease Outcome: Progressing   Problem: Education: Goal: Knowledge of disease and its progression will improve Outcome: Progressing   Problem: Health Behavior/Discharge Planning: Goal: Ability to manage health-related needs will improve Outcome: Progressing   Problem: Clinical Measurements: Goal: Complications related to the disease process or treatment will be avoided or minimized Outcome: Progressing Goal: Dialysis access will remain free of complications Outcome: Progressing   Problem: Activity: Goal: Activity intolerance will  improve Outcome: Progressing   Problem: Fluid Volume: Goal: Fluid volume balance will be maintained or improved Outcome: Progressing   Problem: Nutritional: Goal: Ability to make appropriate dietary choices will improve Outcome: Progressing   Problem: Respiratory: Goal: Respiratory symptoms related to disease process will be avoided Outcome: Progressing   Problem: Self-Concept: Goal: Body image disturbance will be avoided or minimized Outcome: Progressing   Problem: Urinary Elimination: Goal: Progression of disease will be identified and treated Outcome: Progressing   Problem: Clinical Measurements: Goal: Ability to maintain clinical measurements within normal limits will improve Outcome: Progressing   Problem: Skin Integrity: Goal: Risk for impaired skin integrity will decrease Outcome: Progressing

## 2017-06-10 NOTE — Progress Notes (Signed)
[facility] Progress Note    Assessment/ Plan:   52yo male with HTN, MS quadriplegia, Afib, DM, recent decompression of spinal abscess and spinal stenosis who is being followed for AKI and obstructive uropathy. He had a foley catheter that was not draining at SNF, after removal found to have a foreign body, yielded 2.5L urine subsequently. Also with AKI thought to be secondary to vancomycin but has been off for several weeks. Now s/p TDC placement on 2017/06/28 and RUE AVF on 06/01/17.   1. AKI on CKD III-IV. No need for HD now, last session 06/02/17. Thought to be due to obstructive uropathy, now uncertain. BUN Cr ratio 15 and FENa 8.2% c/w postrenal etiology. UOP improved with urology recommending tension free/gravity drainage. Cr had increased 2.2 >3.4>3.9 but now stabilizing at 4 with normal Na and K. Improving as no longer having obstruction as per last PVR of 50cc. Of note, patient has had higher Cr in the past, this may be his new baseline given CKD IV. 2. UTI on ciprofloxacin per primary.  3. Anemia on ESA, s/p 2U RBC. Stable Hgb ~8.  4. MS with quadriplegia, recent spinal abscess. Completed treatment per primary and ID. 5. Thrombocytopenia, likely medication induced from PPI, antifungal. HIT negative. Per hematology.  6. Afib, rate controlled per primary 7. Diabetes per primary 8. HTN on amlodipine and lopressor. BPs elevated. Consider increase amlodipine to 10mg  qd.  Subjective:   States feels well today and does not have complaints.   Objective:   BP (!) 180/90 (BP Location: Left Leg)   Pulse 80   Temp 97.6 F (36.4 C) (Axillary)   Resp 18   Ht 6\' 4"  (1.93 m)   Wt 255 lb (115.7 kg)   SpO2 98%   BMI 31.04 kg/m   Intake/Output Summary (Last 24 hours) at 06/10/2017 0730 Last data filed at 06/09/2017 2338 Gross per 24 hour  Intake 290 ml  Output 1175 ml  Net -885 ml   Weight change: 12 lb 8 oz (5.67 kg)  Physical Exam: Gen: Laying in bed comfortably, in NAD.  Awake and alert. Obese.  L arm contracted. TKP:TWSFKCL rate and rhythm, normal S1 and S2, no murmurs Resp:CTAB, normal effort EXN:TZGY, nontender, nondistended, no palpable bladder, + bowel sounds Ext:no LE edema.   Imaging: Dg Chest Port 1 View  Result Date: 06/09/2017 CLINICAL DATA:  Cough. History of atrial fibrillation, mitral stenosis, diabetes, multiple scleroses, acute kidney injury. EXAM: PORTABLE CHEST 1 VIEW COMPARISON:  Chest x-ray of 2017-06-28 FINDINGS: The lungs are reasonably well inflated. The interstitial markings are increased. There is a moderate-sized right pleural effusion and trace left pleural effusion. The cardiac silhouette is enlarged. There is calcification in the wall of the aortic arch. The dialysis catheter tip projects at the cavoatrial junction. The bony thorax exhibits no acute abnormality. IMPRESSION: CHF with interstitial edema and bilateral pleural effusions. The findings are slightly more conspicuous today. Thoracic aortic atherosclerosis. Electronically Signed   By: David  [place] M.D.   On: 06/09/2017 13:40    Labs: BMET Recent Labs  Lab 06/04/17 0825 06/05/17 0359 06/06/17 0943 06/07/17 0303 06/08/17 0744 06/09/17 0543 06/10/17 0502  NA 137 138 139 139 141 143 143  K 4.3 4.2 5.0 4.5 4.4 4.7 4.7  CL 99* 99* 103 103 104 106 106  CO2 23 26 24 23 25 26 27   GLUCOSE 187* 193* 241* 83 142* 222* 166*  BUN 33* 36* 48* 54* 59* 61* 64*  CREATININE 2.88* 3.22* 3.48* 3.57* 3.87* 3.99* 4.07*  CALCIUM 6.6* 6.9* 7.0* 7.1* 7.2* 7.5* 7.4*  PHOS 3.9 4.3 4.2 3.9 4.8* 4.7* 4.3   CBC Recent Labs  Lab 06/07/17 0309 06/08/17 0744 06/09/17 0543 06/10/17 0502  WBC 18.6* 22.2* 23.5* 24.3*  NEUTROABS  --   --   --  22.5*  HGB 8.4* 8.5* 8.1* 7.8*  HCT 25.0* 25.6* 24.9* 23.7*  MCV 87.1 88.0 88.6 88.1  PLT 34* 30* 35* 34*    Medications:    . amiodarone  200 mg Oral Daily  . amLODipine  5 mg Oral Daily  . ciprofloxacin  500 mg Oral Daily  . collagenase    Topical Daily  . darbepoetin (ARANESP) injection - DIALYSIS  200 mcg Intravenous Q Wed-HD  . dexamethasone  3 mg Oral Q12H  . diltiazem  120 mg Oral QHS  . feeding supplement (ENSURE ENLIVE)  237 mL Oral TID BM  . guaiFENesin  600 mg Oral BID  . insulin aspart  0-5 Units Subcutaneous QHS  . insulin aspart  0-9 Units Subcutaneous TID WC  . insulin aspart  2 Units Subcutaneous TID WC  . insulin glargine  12 Units Subcutaneous Daily  . metoprolol tartrate  12.5 mg Oral BID  . mirtazapine  30 mg Oral QHS  . multivitamin  1 tablet Oral QHS      Leland Her, DO PGY-2, [facility] 06/10/2017 7:30 AM

## 2017-06-11 ENCOUNTER — Inpatient Hospital Stay (HOSPITAL_COMMUNITY): Payer: Medicaid Other

## 2017-06-11 DIAGNOSIS — N186 End stage renal disease: Secondary | ICD-10-CM

## 2017-06-11 DIAGNOSIS — J9600 Acute respiratory failure, unspecified whether with hypoxia or hypercapnia: Secondary | ICD-10-CM

## 2017-06-11 DIAGNOSIS — J9601 Acute respiratory failure with hypoxia: Secondary | ICD-10-CM

## 2017-06-11 DIAGNOSIS — Z9911 Dependence on respirator [ventilator] status: Secondary | ICD-10-CM

## 2017-06-11 DIAGNOSIS — Z87820 Personal history of traumatic brain injury: Secondary | ICD-10-CM

## 2017-06-11 LAB — POCT I-STAT 3, ART BLOOD GAS (G3+)
ACID-BASE DEFICIT: 6 mmol/L — AB (ref 0.0–2.0)
ACID-BASE DEFICIT: 4 mmol/L — AB (ref 0.0–2.0)
ACID-BASE DEFICIT: 8 mmol/L — AB (ref 0.0–2.0)
BICARBONATE: 20.4 mmol/L (ref 20.0–28.0)
Bicarbonate: 22.5 mmol/L (ref 20.0–28.0)
Bicarbonate: 19.2 mmol/L — ABNORMAL LOW (ref 20.0–28.0)
O2 SAT: 84 %
O2 SAT: 87 %
O2 Saturation: 87 %
PCO2 ART: 44.7 mmHg (ref 32.0–48.0)
PH ART: 7.296 — AB (ref 7.350–7.450)
PO2 ART: 56 mmHg — AB (ref 83.0–108.0)
PO2 ART: 62 mmHg — AB (ref 83.0–108.0)
TCO2: 22 mmol/L (ref 22–32)
TCO2: 24 mmol/L (ref 22–32)
TCO2: 21 mmol/L — ABNORMAL LOW (ref 22–32)
pCO2 arterial: 46.2 mmHg (ref 32.0–48.0)
pCO2 arterial: 44.7 mmHg (ref 32.0–48.0)
pH, Arterial: 7.266 — ABNORMAL LOW (ref 7.350–7.450)
pH, Arterial: 7.241 — ABNORMAL LOW (ref 7.350–7.450)
pO2, Arterial: 59 mmHg — ABNORMAL LOW (ref 83.0–108.0)

## 2017-06-11 LAB — TYPE AND SCREEN
ABO/RH(D): B POS
Antibody Screen: NEGATIVE
Unit division: 0

## 2017-06-11 LAB — BPAM RBC
Blood Product Expiration Date: 201906012359
ISSUE DATE / TIME: 201905091409
Unit Type and Rh: 7300

## 2017-06-11 LAB — RENAL FUNCTION PANEL
ALBUMIN: 1.7 g/dL — AB (ref 3.5–5.0)
Anion gap: 13 (ref 5–15)
BUN: 71 mg/dL — AB (ref 6–20)
CALCIUM: 7.3 mg/dL — AB (ref 8.9–10.3)
CO2: 23 mmol/L (ref 22–32)
CREATININE: 4.17 mg/dL — AB (ref 0.61–1.24)
Chloride: 105 mmol/L (ref 101–111)
GFR, EST AFRICAN AMERICAN: 18 mL/min — AB (ref >60)
GFR, EST NON AFRICAN AMERICAN: 15 mL/min — AB (ref >60)
Glucose, Bld: 207 mg/dL — ABNORMAL HIGH (ref 65–99)
PHOSPHORUS: 4.5 mg/dL (ref 2.5–4.6)
Potassium: 4.4 mmol/L (ref 3.5–5.1)
SODIUM: 141 mmol/L (ref 135–145)

## 2017-06-11 LAB — CBC
HCT: 29.9 % — ABNORMAL LOW (ref 39.0–52.0)
Hemoglobin: 9.9 g/dL — ABNORMAL LOW (ref 13.0–17.0)
MCH: 28.8 pg (ref 26.0–34.0)
MCHC: 33.1 g/dL (ref 30.0–36.0)
MCV: 86.9 fL (ref 78.0–100.0)
PLATELETS: 29 10*3/uL — AB (ref 150–400)
RBC: 3.44 MIL/uL — AB (ref 4.22–5.81)
RDW: 16.8 % — AB (ref 11.5–15.5)
WBC: 17.2 10*3/uL — AB (ref 4.0–10.5)

## 2017-06-11 LAB — COOXEMETRY PANEL
Carboxyhemoglobin: 1.2 % (ref 0.5–1.5)
Methemoglobin: 1.5 % (ref 0.0–1.5)
O2 Saturation: 63.2 %
Total hemoglobin: 10.4 g/dL — ABNORMAL LOW (ref 12.0–16.0)

## 2017-06-11 LAB — BLOOD GAS, ARTERIAL
ACID-BASE DEFICIT: 1.9 mmol/L (ref 0.0–2.0)
Bicarbonate: 24.1 mmol/L (ref 20.0–28.0)
Drawn by: 252031
FIO2: 100
O2 SAT: 76.3 %
PCO2 ART: 54.6 mmHg — AB (ref 32.0–48.0)
PH ART: 7.268 — AB (ref 7.350–7.450)
PO2 ART: 48.7 mmHg — AB (ref 83.0–108.0)
Patient temperature: 98.6

## 2017-06-11 LAB — PROTIME-INR
INR: 1.42
PROTHROMBIN TIME: 17.3 s — AB (ref 11.4–15.2)

## 2017-06-11 LAB — GLUCOSE, CAPILLARY
GLUCOSE-CAPILLARY: 191 mg/dL — AB (ref 65–99)
GLUCOSE-CAPILLARY: 213 mg/dL — AB (ref 65–99)
Glucose-Capillary: 211 mg/dL — ABNORMAL HIGH (ref 65–99)
Glucose-Capillary: 210 mg/dL — ABNORMAL HIGH (ref 65–99)
Glucose-Capillary: 165 mg/dL — ABNORMAL HIGH (ref 65–99)

## 2017-06-11 LAB — MRSA PCR SCREENING: MRSA BY PCR: NEGATIVE

## 2017-06-11 LAB — PROCALCITONIN: PROCALCITONIN: 2.94 ng/mL

## 2017-06-11 LAB — CORTISOL: CORTISOL PLASMA: 13.3 ug/dL

## 2017-06-11 LAB — APTT: APTT: 30 s (ref 24–36)

## 2017-06-11 LAB — LACTIC ACID, PLASMA: Lactic Acid, Venous: 3.6 mmol/L (ref 0.5–1.9)

## 2017-06-11 MED ORDER — NOREPINEPHRINE BITARTRATE 1 MG/ML IV SOLN
0.0000 ug/min | INTRAVENOUS | Status: DC
  Administered 2017-06-11: 20 ug/min via INTRAVENOUS
  Filled 2017-06-11 (×2): qty 4

## 2017-06-11 MED ORDER — CHLORHEXIDINE GLUCONATE 0.12% ORAL RINSE (MEDLINE KIT)
15.0000 mL | Freq: Two times a day (BID) | OROMUCOSAL | Status: DC
  Administered 2017-06-11 (×2): 15 mL via OROMUCOSAL

## 2017-06-11 MED ORDER — PANTOPRAZOLE SODIUM 40 MG PO PACK
40.0000 mg | PACK | Freq: Every day | ORAL | Status: DC
  Administered 2017-06-11: 40 mg
  Filled 2017-06-11: qty 20

## 2017-06-11 MED ORDER — AMLODIPINE BESYLATE 5 MG PO TABS: 5.0000 mg | ORAL_TABLET | Freq: Every day | ORAL | Status: DC

## 2017-06-11 MED ORDER — HEPARIN SODIUM (PORCINE) 1000 UNIT/ML DIALYSIS
1000.0000 [IU] | INTRAMUSCULAR | Status: DC | PRN
  Filled 2017-06-11: qty 6

## 2017-06-11 MED ORDER — SODIUM CHLORIDE 0.9 % FOR CRRT
INTRAVENOUS_CENTRAL | Status: DC | PRN
  Administered 2017-06-11: 15:00:00 via INTRAVENOUS_CENTRAL
  Filled 2017-06-11: qty 1000

## 2017-06-11 MED ORDER — AMIODARONE HCL 200 MG PO TABS
200.0000 mg | ORAL_TABLET | Freq: Every day | ORAL | Status: DC
  Administered 2017-06-11: 200 mg
  Filled 2017-06-11 (×2): qty 1

## 2017-06-11 MED ORDER — PRISMASOL BGK 4/2.5 32-4-2.5 MEQ/L IV SOLN
INTRAVENOUS | Status: DC
  Administered 2017-06-11: 15:00:00 via INTRAVENOUS_CENTRAL
  Filled 2017-06-11: qty 5000

## 2017-06-11 MED ORDER — ETOMIDATE 2 MG/ML IV SOLN
20.0000 mg | Freq: Once | INTRAVENOUS | Status: AC
Start: 2017-06-11 — End: 2017-06-11
  Administered 2017-06-11: 20 mg via INTRAVENOUS

## 2017-06-11 MED ORDER — ORAL CARE MOUTH RINSE: 15.0000 mL | OROMUCOSAL | Status: DC

## 2017-06-11 MED ORDER — VASOPRESSIN 20 UNIT/ML IV SOLN
0.0300 [IU]/min | INTRAVENOUS | Status: DC
  Administered 2017-06-11: 0.03 [IU]/min via INTRAVENOUS
  Filled 2017-06-11: qty 2

## 2017-06-11 MED ORDER — DEXAMETHASONE 1.5 MG PO TABS
3.0000 mg | ORAL_TABLET | Freq: Two times a day (BID) | ORAL | Status: DC
  Administered 2017-06-11: 3 mg
  Filled 2017-06-11 (×3): qty 2

## 2017-06-11 MED ORDER — SODIUM BICARBONATE 8.4 % IV SOLN
INTRAVENOUS | Status: AC
  Administered 2017-06-11: 09:00:00
  Filled 2017-06-11: qty 50

## 2017-06-11 MED ORDER — DOCUSATE SODIUM 50 MG/5ML PO LIQD: 100.0000 mg | Freq: Two times a day (BID) | ORAL | Status: DC | PRN

## 2017-06-11 MED ORDER — METHOCARBAMOL 500 MG PO TABS
500.0000 mg | ORAL_TABLET | Freq: Four times a day (QID) | ORAL | Status: DC | PRN
  Filled 2017-06-11: qty 1

## 2017-06-11 MED ORDER — PRISMASOL BGK 4/2.5 32-4-2.5 MEQ/L IV SOLN
INTRAVENOUS | Status: DC
  Administered 2017-06-11: 15:00:00 via INTRAVENOUS_CENTRAL
  Filled 2017-06-11 (×2): qty 5000

## 2017-06-11 MED ORDER — SODIUM CHLORIDE 0.9 % IV BOLUS
500.0000 mL | Freq: Once | INTRAVENOUS | Status: AC
  Administered 2017-06-11: 500 mL via INTRAVENOUS

## 2017-06-11 MED ORDER — INSULIN ASPART 100 UNIT/ML ~~LOC~~ SOLN
0.0000 [IU] | SUBCUTANEOUS | Status: DC
  Administered 2017-06-11 (×2): 3 [IU] via SUBCUTANEOUS
  Administered 2017-06-11: 2 [IU] via SUBCUTANEOUS
  Administered 2017-06-11: 3 [IU] via SUBCUTANEOUS

## 2017-06-11 MED ORDER — BISACODYL 10 MG RE SUPP: 10.0000 mg | Freq: Every day | RECTAL | Status: DC | PRN

## 2017-06-11 MED ORDER — ACETAMINOPHEN 325 MG PO TABS: 650.0000 mg | ORAL_TABLET | ORAL | Status: DC | PRN

## 2017-06-11 MED ORDER — MIDAZOLAM HCL 2 MG/2ML IJ SOLN
2.0000 mg | INTRAMUSCULAR | Status: DC | PRN
  Administered 2017-06-11 (×3): 2 mg via INTRAVENOUS
  Filled 2017-06-11 (×2): qty 2

## 2017-06-11 MED ORDER — METOPROLOL TARTRATE 12.5 MG HALF TABLET: 12.5000 mg | ORAL_TABLET | Freq: Two times a day (BID) | ORAL | Status: DC

## 2017-06-11 MED ORDER — CHLORHEXIDINE GLUCONATE 0.12% ORAL RINSE (MEDLINE KIT): 15.0000 mL | Freq: Two times a day (BID) | OROMUCOSAL | Status: DC

## 2017-06-11 MED ORDER — DEXTROSE 5 % IV SOLN
0.0000 ug/min | INTRAVENOUS | Status: DC
  Administered 2017-06-11: 50 ug/min via INTRAVENOUS
  Administered 2017-06-11: 60 ug/min via INTRAVENOUS
  Administered 2017-06-11: 20 ug/min via INTRAVENOUS
  Filled 2017-06-11 (×3): qty 16

## 2017-06-11 MED ORDER — FENTANYL CITRATE (PF) 100 MCG/2ML IJ SOLN
100.0000 ug | INTRAMUSCULAR | Status: DC | PRN
  Administered 2017-06-11 (×3): 100 ug via INTRAVENOUS
  Filled 2017-06-11 (×2): qty 2

## 2017-06-11 MED ORDER — MIDAZOLAM HCL 2 MG/2ML IJ SOLN
2.0000 mg | INTRAMUSCULAR | Status: DC | PRN
  Administered 2017-06-11 (×2): 2 mg via INTRAVENOUS
  Administered 2017-06-11: 1 mg via INTRAVENOUS
  Filled 2017-06-11 (×3): qty 2

## 2017-06-11 MED ORDER — FUROSEMIDE 10 MG/ML IJ SOLN
40.0000 mg | Freq: Once | INTRAMUSCULAR | Status: AC
  Administered 2017-06-11: 40 mg via INTRAVENOUS
  Filled 2017-06-11: qty 4

## 2017-06-11 MED ORDER — SODIUM CHLORIDE 0.9 % IV SOLN
INTRAVENOUS | Status: DC
  Administered 2017-06-11: 08:00:00 via INTRAVENOUS

## 2017-06-11 MED ORDER — FENTANYL CITRATE (PF) 100 MCG/2ML IJ SOLN
100.0000 ug | INTRAMUSCULAR | Status: DC | PRN
  Administered 2017-06-11 (×2): 100 ug via INTRAVENOUS
  Filled 2017-06-11 (×3): qty 2

## 2017-06-11 MED ORDER — FENTANYL CITRATE (PF) 100 MCG/2ML IJ SOLN
INTRAMUSCULAR | Status: AC
  Administered 2017-06-11: 100 ug via INTRAVENOUS
  Filled 2017-06-11: qty 2

## 2017-06-11 MED ORDER — PRISMASOL BGK 4/2.5 32-4-2.5 MEQ/L IV SOLN
INTRAVENOUS | Status: DC
  Administered 2017-06-11 (×2): via INTRAVENOUS_CENTRAL
  Filled 2017-06-11 (×10): qty 5000

## 2017-06-11 MED ORDER — ORAL CARE MOUTH RINSE
15.0000 mL | Freq: Four times a day (QID) | OROMUCOSAL | Status: DC
  Administered 2017-06-11 (×2): 15 mL via OROMUCOSAL

## 2017-06-11 MED ORDER — MIDAZOLAM HCL 2 MG/2ML IJ SOLN
INTRAMUSCULAR | Status: AC
  Administered 2017-06-11: 2 mg via INTRAVENOUS
  Filled 2017-06-11: qty 2

## 2017-06-13 LAB — CULTURE, RESPIRATORY

## 2017-06-13 LAB — CULTURE, RESPIRATORY W GRAM STAIN

## 2017-06-15 LAB — CULTURE, BLOOD (ROUTINE X 2)
CULTURE: NO GROWTH
Culture: NO GROWTH
Special Requests: ADEQUATE
Special Requests: ADEQUATE

## 2017-07-03 NOTE — Progress Notes (Signed)
Spoke with pt's POA.  Explained severity of illness, and that he might not survive.  Discussed code status.  She would like to discuss further with remainder of her family and inform medical team once decision is made.  Coralyn Helling, MD [facility] Pulmonary/Critical Care 2017-06-13, 6:33 PM

## 2017-07-03 NOTE — Progress Notes (Signed)
Pt placed emergently on Bipap.  Pt not getting good volumes.  CCM at bedside to intubate.

## 2017-07-03 NOTE — Progress Notes (Addendum)
Occupational Therapy Re-Evaluation Patient Details Name: Dylan [name] MRN: [id number] DOB: 08/22/1965 Today's Date: 06/05/2017    History of Present Illness Najeeb "[name]" Dentinger is a 52yo black male who comes to [facility] on 3/30 with ABD pain and hypoglycemia, admitted with cervical abcess v hematoma s/p C five-seven surgery on . 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 He DC to SNF ~3WA with motor weakness C7 down, full sensation intact. He reports graual worsening of manipulative use with smartphone over those three weaks.  underwent right-sided tunneled catheter placement for hemodialysis and right brachiocephalic AV fistula on 06/01/17. Deveoped hypoxic/hypercapnic respiratory failure, was intubated and transferred to ICU.    Clinical Impression   Prior to transfer to ICU, "[name]" with overall functioning @ C6-7 level with RUE and C5-6 level on L with abnormal tone and developing L elbow contracture. Pt was able to feed self finger foods using a tenodesis grasp and drink with an adapted cup after setup. He tolerated the "chair position" using the Vital Tilt bed with lateral supports for trunk.He used a voice activation with "Alexa"/integrated with his cell phone to communicate with family given the inability to use his hands. He loves [employer] and sang at [employer].  Pt sedated/intubated. Discussed with nursing need to use B resting hand splints (on 2 hr/off 2 hr) with skin checks, keep BUE supported on pillows, encourage L elbow extension and use of B Prevalon boots while in bed. Encourage PROM BUE and LE daily.  Will follow acutely for positioning/contracture management and staff education. Will update goals if pt progresses.       Follow Up Recommendations  SNF;Supervision/Assistance - 24 hour    Equipment Recommendations  Other (comment)(TBA)     Recommendations for Other Services (Palliative Consult)     Precautions / Restrictions Precautions Precautions: Fall;Cervical Precaution Comments: vent  Required Braces or Orthoses: Other Brace/Splint Other Brace/Splint: B resting hand splints; B prevalon boots; no longer using WHO                                                                   Pertinent Vitals/Pain Pain Assessment: Faces Faces Pain Scale: No hurt     Hand Dominance     Extremity/Trunk Assessment Upper Extremity Assessment Upper Extremity Assessment: RUE deficits/detail;LUE deficits/detail RUE Deficits / Details: prior to transfer to ICU, using tenodesis grasp to assist with self feeding; practicing with U-cuff;  LUE Deficits / Details: functioning    Lower Extremity Assessment Lower Extremity Assessment: (hip tightness but functional PROM; no AROM)   Cervical / Trunk Assessment Cervical / Trunk Assessment: Other exceptions(cervical surgery)   Communication Communication Communication: (intubated)   Cognition                                       General Comments: sedated/intubated   General Comments       Exercises     Shoulder Instructions      Home Living Family/patient expects to be discharged to:: Skilled nursing facility  Prior Functioning/Environment Level of Independence: Needs assistance  Gait / Transfers Assistance Needed: prior to this admission, pt was quadriplegic, intermittent motor use from C6 up; prior to initial admission in February, pt was independent with ADLs, iADLs and mobility     Comments: Dietitian; Drives.         OT Problem List: Decreased strength;Decreased range of motion;Decreased activity tolerance;Impaired balance (sitting and/or standing);Decreased coordination;Decreased knowledge of use of DME or AE;Cardiopulmonary status limiting  activity;Obesity;Impaired UE functional use;Increased edema      OT Treatment/Interventions: Splinting;Therapeutic exercise;Therapeutic activities;Patient/family education    OT Goals(Current goals can be found in the care plan section) Acute Rehab OT Goals Patient Stated Goal: unable to state OT Goal Formulation: Patient unable to participate in goal setting Time For Goal Achievement: 06/25/17 Potential to Achieve Goals: Fair  OT Frequency: Min 2X/week   Barriers to D/C:            Co-evaluation              AM-PAC PT "6 Clicks" Daily Activity     Outcome Measure Help from another person eating meals?: Total Help from another person taking care of personal grooming?: Total Help from another person toileting, which includes using toliet, bedpan, or urinal?: Total Help from another person bathing (including washing, rinsing, drying)?: Total Help from another person to put on and taking off regular upper body clothing?: Total Help from another person to put on and taking off regular lower body clothing?: Total 6 Click Score: 6   End of Session Nurse Communication: Other (comment)(splint schedule; positioning)  Activity Tolerance: Patient tolerated treatment well Patient left: in bed;with call bell/phone within reach  OT Visit Diagnosis: Other abnormalities of gait and mobility (R26.89);Muscle weakness (generalized) (M62.81)                Time: 1015-1040 OT Time Calculation (min): 25 min Charges:  OT General Charges $OT Visit: 1 Visit OT Evaluation $OT Re-eval: 1 Re-eval G-Codes:     Dabney Dever, OT/L  [phone] 06/23/2017  Renad Jenniges,HILLARY 06/04/2017, 10:59 AM

## 2017-07-03 NOTE — Progress Notes (Signed)
Inpatient Diabetes Program Recommendations  AACE/ADA: New Consensus Statement on Inpatient Glycemic Control (2015)  Target Ranges:  Prepandial:   less than 140 mg/dL      Peak postprandial:   less than 180 mg/dL (1-2 hours)      Critically ill patients:  140 - 180 mg/dL   Lab Results  Component Value Date   GLUCAP 210 (H) 06/23/2017   HGBA1C 8.4 (H) 03/22/2017    Review of Glycemic ControlResults for ARCHIT, KNOTH A (MRN [id number]) as of 06/21/2017 14:56  Ref. Range 06/17/2017 06:40 06/21/2017 08:46 06/09/2017 11:36  Glucose-Capillary Latest Ref Range: 65 - 99 mg/dL 791 (H) 505 (H) 697 (H)    Diabetes history: Type 2 DM  Outpatient Diabetes medications:  Novolog 2-14 units tid with meals, Lantus 70 units q HS, Metformin 1000 mg q AM  Current orders for Inpatient glycemic control:  Novolog sensitive q 4 hours, Decadron 3 mg q 12 hours Inpatient Diabetes Program Recommendations:  May consider increasing Novolog correction to moderate q 4 hours.   Thanks,  Beryl Meager, RN, BC-ADM Inpatient Diabetes Coordinator Pager [phone]

## 2017-07-03 NOTE — Progress Notes (Signed)
   06/22/2017 2000  Clinical Encounter Type  Visited With Patient;Family;Health care provider;Other (Comment) (CSW)  Visit Type Initial  Referral From Social work  Consult/Referral To Chaplain   Responded to a page from the CSW [place].  Family had thought the patient had completed at [facility].  We searched notes and the chart and did not find one.  Family discovered it had not been completed.  Family thought some form (maybe MOST form) had been done through Palliative Care and so CSW called Palliative.  Supported the family especially the next of kin who is struggling about what to do, but was speaking with the nurse and physician again.  Will follow and support as needed. Chaplain Agustin Cree

## 2017-07-03 NOTE — Progress Notes (Signed)
Patient O2 went down too 77%. Unable to get it about 88. RR Nurse called. Respiratory called. ABG's being done.RR and respat bed side

## 2017-07-03 NOTE — Progress Notes (Signed)
Family aware of patient's vitals trending down. No other interventions at this time. Patient a limited code.  2230-Family made aware that patient's HR went from 80s to 30s. BP still trending down. SpO2 undetectable.   2233- Patient went asystole. Arterial BP flatlined. Ventilator and CRRT machines turned off at this time. Vasopressin and levophed also turned off. Pronounced deceased by [name], NP and Dorthula Matas, RN.   [phone]- Patient's lines and ETT removed.

## 2017-07-03 NOTE — Progress Notes (Addendum)
INFECTIOUS DISEASE PROGRESS NOTE  ID: Dylan [name] is a 52 y.o. male with  Principal Problem:   AKI (acute kidney injury) ([place]) Active Problems:   Uncontrolled diabetes mellitus type 2 without complications ([place])   Hypertension   Morbid obesity ([place])   Paroxysmal atrial fibrillation (HCC)   Multiple sclerosis exacerbation (HCC)   Pressure injury of skin   Hyperkalemia   Hematoma   Insomnia   Adjustment reaction with anxiety and depression   Quadriplegia ([place])   History of fusion of cervical spine   Postoperative wound infection   Coronary artery disease   Atherosclerotic peripheral vascular disease (HCC)   MDD (major depressive disorder), single episode, moderate (HCC)   Candidemia ([place])   Goals of care, counseling/discussion   Palliative care by specialist   Acute lower UTI  Subjective: Respiratory failure overnight. Intubated.  I/O -570 yesterday, -6.8L total.  Abtx:  Anti-infectives (From admission, onward)   Start     Dose/Rate Route Frequency Ordered Stop   06/04/17 1800  ciprofloxacin (CIPRO) tablet 500 mg  Status:  Discontinued     500 mg Oral Daily 06/04/17 1322 06/10/17 1413   06/03/17 1045  ciprofloxacin (CIPRO) tablet 500 mg  Status:  Discontinued     500 mg Oral Daily with breakfast 06/03/17 1035 06/04/17 1322   06/02/17 1345  ceFEPIme (MAXIPIME) 1 g in sodium chloride 0.9 % 100 mL IVPB  Status:  Discontinued     1 g 200 mL/hr over 30 Minutes Intravenous Every 24 hours 06/02/17 1342 06/03/17 1034   05/16/2017 0818  sodium chloride 0.9 % with cefUROXime (ZINACEF) ADS Med    Note to Pharmacy:  [name]   : cabinet override      05/24/2017 0818 05/16/2017 1005   05/09/2017 0600  cefUROXime (ZINACEF) 1.5 g in sodium chloride 0.9 % 100 mL IVPB  Status:  Discontinued     1.5 g 200 mL/hr over 30 Minutes Intravenous On call to O.R. 05/28/17 0729 05/28/17 1244   05/11/2017 0600  cefUROXime (ZINACEF) 1.5 g in sodium chloride 0.9 % 100 mL IVPB     1.5 g 200 mL/hr  over 30 Minutes Intravenous To ShortStay Surgical 05/28/17 1244 06/01/2017 2200   05/29/17 1300  cefTRIAXone (ROCEPHIN) 1 g in sodium chloride 0.9 % 100 mL IVPB  Status:  Discontinued     1 g 200 mL/hr over 30 Minutes Intravenous Every 24 hours 05/29/17 1223 06/02/17 1242   05/28/17 0600  cefUROXime (ZINACEF) 1.5 g in sodium chloride 0.9 % 100 mL IVPB     1.5 g 200 mL/hr over 30 Minutes Intravenous To ShortStay Surgical 05/27/17 1505 05/29/17 0600   05/08/17 2000  DAPTOmycin (CUBICIN) 900 mg in sodium chloride 0.9 % IVPB     900 mg 236 mL/hr over 30 Minutes Intravenous Every 48 hours 05/07/17 1114 05/16/17 2230   05/05/17 2000  anidulafungin (ERAXIS) 100 mg in sodium chloride 0.9 % 100 mL IVPB  Status:  Discontinued     100 mg 78 mL/hr over 100 Minutes Intravenous Every 24 hours 05/04/17 1908 05/14/17 1751   05/04/17 2000  anidulafungin (ERAXIS) 200 mg in sodium chloride 0.9 % 200 mL IVPB     200 mg 78 mL/hr over 200 Minutes Intravenous  Once 05/04/17 1908 05/05/17 0000   05/02/17 1200  DAPTOmycin (CUBICIN) 1,000 mg in sodium chloride 0.9 % IVPB  Status:  Discontinued     1,000 mg 240 mL/hr over 30 Minutes Intravenous Every 48 hours  05/02/17 1143 05/07/17 1114   04/07/2017 1900  ceFEPIme (MAXIPIME) 1 g in sodium chloride 0.9 % 100 mL IVPB     1 g 200 mL/hr over 30 Minutes Intravenous Daily after supper 04/02/2017 1833 05/16/17 1850   04/03/2017 1800  ceFEPime (MAXIPIME) IVPB  Status:  Discontinued    Note to Pharmacy:  Indication:  Spinal abscess Last Day of Therapy:  05/16/2017 Labs - Once weekly:  CBC/D and BMP, Labs - Every other week:  ESR and CRP     2 g Intravenous Every 12 hours 04/04/2017 1757 04/27/2017 1833   04/26/2017 1245  vancomycin (VANCOCIN) 2,000 mg in sodium chloride 0.9 % 500 mL IVPB     2,000 mg 250 mL/hr over 120 Minutes Intravenous  Once 04/29/2017 1231 04/29/2017 1757   04/23/2017 1230  piperacillin-tazobactam (ZOSYN) IVPB 3.375 g     3.375 g 100 mL/hr over 30 Minutes Intravenous   Once 04/18/2017 1217 04/29/2017 1407   04/28/2017 1230  vancomycin (VANCOCIN) IVPB 1000 mg/200 mL premix  Status:  Discontinued     1,000 mg 200 mL/hr over 60 Minutes Intravenous  Once 04/21/2017 1217 04/09/2017 1231      Medications:  Scheduled: . amiodarone  200 mg Per Tube Daily  . chlorhexidine gluconate (MEDLINE KIT)  15 mL Mouth Rinse BID  . collagenase   Topical Daily  . darbepoetin (ARANESP) injection - NON-DIALYSIS  100 mcg Subcutaneous Q Thu-1800  . dexamethasone  3 mg Per Tube Q12H  . fentaNYL      . insulin aspart  0-9 Units Subcutaneous Q4H  . mouth rinse  15 mL Mouth Rinse QID  . multivitamin  1 tablet Oral QHS  . pantoprazole sodium  40 mg Per Tube Daily  . sodium bicarbonate        Objective: Vital signs in last 24 hours: Temp:  [97.3 F (36.3 C)-97.6 F (36.4 C)] 97.3 F (36.3 C) (05/10 0706) Pulse Rate:  [75-85] 81 (05/10 1152) Resp:  [15-34] 25 (05/10 1152) BP: (66-183)/(33-98) 101/65 (05/10 1152) SpO2:  [79 %-100 %] 90 % (05/10 1152) Arterial Line BP: (101-113)/(66-80) 101/66 (05/10 1100) FiO2 (%):  [100 %] 100 % (05/10 1152) Weight:  [109.8 kg (242 lb 1 oz)-117 kg (258 lb)] 109.8 kg (242 lb 1 oz) (05/10 0815)   General appearance: no distress Resp: rhonchi anterior - bilateral Cardio: regular rate and rhythm GI: normal findings: bowel sounds normal and soft, non-tender Extremities: edema none pretibial. feet in boots.   Lab Results Recent Labs    06/10/17 0502 06/10/17 1839 07-08-17 0455  WBC 24.3* 23.2* 17.2*  HGB 7.8* 9.5* 9.9*  HCT 23.7* 28.5* 29.9*  NA 143  --  141  K 4.7  --  4.4  CL 106  --  105  CO2 27  --  23  BUN 64*  --  71*  CREATININE 4.07*  --  4.17*   Liver Panel Recent Labs    06/10/17 0502 07/08/2017 0455  ALBUMIN 1.8* 1.7*   Sedimentation Rate No results for input(s): ESRSEDRATE in the last 72 hours. C-Reactive Protein No results for input(s): CRP in the last 72 hours.  Microbiology: Recent Results (from the past 240  hour(s))  Culture, blood (routine x 2)     Status: None (Preliminary result)   Collection Time: 06/10/17 12:20 PM  Result Value Ref Range Status   Specimen Description BLOOD RIGHT HAND  Final   Special Requests   Final    BOTTLES DRAWN AEROBIC  ONLY Blood Culture adequate volume   Culture   Final    NO GROWTH < 24 HOURS Performed at [facility], [place] [address] [place], [place] [address]    Report Status PENDING  Incomplete  Culture, blood (routine x 2)     Status: None (Preliminary result)   Collection Time: 06/10/17 12:28 PM  Result Value Ref Range Status   Specimen Description BLOOD RIGHT HAND  Final   Special Requests   Final    BOTTLES DRAWN AEROBIC ONLY Blood Culture adequate volume   Culture   Final    NO GROWTH < 24 HOURS Performed at [facility], [place] [address] [place], [place] [address]    Report Status PENDING  Incomplete  MRSA PCR Screening     Status: None   Collection Time: 07-05-2017  8:25 AM  Result Value Ref Range Status   MRSA by PCR NEGATIVE NEGATIVE Final    Comment:        The GeneXpert MRSA Assay (FDA approved for NASAL specimens only), is one component of a comprehensive MRSA colonization surveillance program. It is not intended to diagnose MRSA infection nor to guide or monitor treatment for MRSA infections. Performed at [facility], [place] [address], [place], [place] [address]     Studies/Results: Dg Chest Port 1 View  Result Date: 05-Jul-2017 CLINICAL DATA:  52 year old male with shortness of breath. EXAM: PORTABLE CHEST 1 VIEW COMPARISON:  Chest radiograph dated 06/09/2017 FINDINGS: Right-sided dialysis catheter in similar position. There is cardiomegaly with diffuse interstitial and alveolar edema, worsened compared to the earlier radiograph. Pneumonia is not excluded. There are probable bilateral pleural effusions. No pneumothorax. No acute osseous pathology. IMPRESSION: Interval progression of the CHF compared to the earlier  radiograph. Electronically Signed   By: Anner Crete M.D.   On: Jul 05, 2017 06:30   Dg Chest Port 1 View  Result Date: 06/09/2017 CLINICAL DATA:  Cough. History of atrial fibrillation, mitral stenosis, diabetes, multiple scleroses, acute kidney injury. EXAM: PORTABLE CHEST 1 VIEW COMPARISON:  Chest x-ray of May 31, 2017 FINDINGS: The lungs are reasonably well inflated. The interstitial markings are increased. There is a moderate-sized right pleural effusion and trace left pleural effusion. The cardiac silhouette is enlarged. There is calcification in the wall of the aortic arch. The dialysis catheter tip projects at the cavoatrial junction. The bony thorax exhibits no acute abnormality. IMPRESSION: CHF with interstitial edema and bilateral pleural effusions. The findings are slightly more conspicuous today. Thoracic aortic atherosclerosis. Electronically Signed   By: David  Martinique M.D.   On: 06/09/2017 13:40     Assessment/Plan: MS Leukocytosis Decubitus ulcers Prev Hematoma/abscess at C6 decompression site Prev fungemia Prev GI bleed.   Total days of antibiotics: off Intbx, on pressor.  CXR read as worsening of CHF. Will continue to watch off anbx.  If clinical change consider BAL.  BCx 5-9 pending. No fever (? Steroids underlying), WBC better D/i CCM          Bobby Rumpf MD, FACP Infectious Diseases (pager) [phone] [email or web address] July 05, 2017, 12:08 PM  LOS: 41 days

## 2017-07-03 NOTE — Progress Notes (Signed)
Nutrition Follow-up  DOCUMENTATION CODES:   Obesity unspecified  INTERVENTION:   If unable to extubate patient within the next 24 hours, recommend starting TF via OGT:  Vital AF 1.2 at 85 ml/h (2040 ml per day)  Provides 2448 kcal, 153 gm protein, 1654 ml free water daily  NUTRITION DIAGNOSIS:   Inadequate oral intake related to inability to eat as evidenced by NPO status.  Ongoing  GOAL:   Patient will meet greater than or equal to 90% of their needs  Unmet  MONITOR:   Vent status, Skin, Labs, I & O's  REASON FOR ASSESSMENT:   Ventilator    ASSESSMENT:   Pt with PMH of HTN, DM, MS, recent quadriplegia, s/p decompression spinal surgery on C5, C6 and C7 (03/25/17) developing multiple issues post op including new Afib and prevertebral hematoma at surgical sight. Pt presents with abdominal pain found AKI  Discussed patient in ICU rounds and with RN today. Patient was transferred to the ICU and intubated this morning due to inability to protect airway. CRRT is being initiated today.   Patient is currently intubated on ventilator support MV: 15 L/min Temp (24hrs), Avg:97.5 F (36.4 C), Min:97.3 F (36.3 C), Max:97.6 F (36.4 C)  Labs reviewed. CBG's: [phone] Medications reviewed and include Renavit.  Diet Order:   Diet Order           Diet NPO time specified  Diet effective now          EDUCATION NEEDS:   No education needs have been identified at this time  Skin:  Skin Assessment: Skin Integrity Issues: Skin Integrity Issues:: Stage II, Unstageable, Incisions Stage II: N/A Unstageable: sacrum Incisions: closed to R arm and R Neck  Last BM:  5/8 (type 6)  Height:   Ht Readings from Last 1 Encounters:  05/05/17 6\' 4"  (1.93 m)    Weight:   Wt Readings from Last 1 Encounters:  06/02/2017 242 lb 1 oz (109.8 kg)    Ideal Body Weight:  91.8 kg  BMI:  Body mass index is 29.47 kg/m.  Estimated Nutritional Needs:   Kcal:  2410  Protein:   150-170 gm  Fluid:  2.4 L    Joaquin Courts, RD, LDN, CNSC Pager [phone] After Hours Pager [phone]

## 2017-07-03 NOTE — Progress Notes (Signed)
When NT was taking vital signs, oxygen saturation was 88%. Mary, an ICU nurse who had floated to floor was able to bring his saturations up to97 on a non rebreather /15 Liters. I called respiratory, who came, assessed patient and she stated "I heard crackles on the left side, clear on the right".Paged [facility]. Ordered a chest Xray and 40 of lasix. Update from respiratory: 14 L on Venti Mask. Will continue to monitor.

## 2017-07-03 NOTE — Procedures (Signed)
Central Venous Catheter Insertion Procedure Note JONTUE JEANNETTE [id number] Mar 08, 1965  Procedure: Insertion of Central Venous Catheter Indications: Assessment of intravascular volume, Drug and/or fluid administration and Frequent blood sampling  Procedure Details Consent: Unable to obtain consent because of emergent medical necessity. Time Out: Verified patient identification, verified procedure, site/side was marked, verified correct patient position, special equipment/implants available, medications/allergies/relevent history reviewed, required imaging and test results available.  Performed  Maximum sterile technique was used including antiseptics, cap, gloves, gown, hand hygiene, mask and sheet. Skin prep: Chlorhexidine; local anesthetic administered A antimicrobial bonded/coated triple lumen catheter was placed in the left internal jugular vein using the Seldinger technique. Ultrasound guidance used.Yes.   Catheter placed to 20 cm. Blood aspirated via all 3 ports and then flushed x 3. Line sutured x 2 and dressing applied.  Evaluation Blood flow good Complications: No apparent complications Patient did tolerate procedure well. Chest X-ray ordered to verify placement.  CXR: pending.  Brett Canales Minor ACNP Adolph Pollack PCCM Pager [phone] till 1 pm If no answer page [phone] 06/02/2017, 8:00 AM

## 2017-07-03 NOTE — Procedures (Signed)
Arterial Catheter Insertion Procedure Note Dylan [name] [id number] 07/25/1965  Procedure: Insertion of Arterial Catheter  Indications: Blood pressure monitoring and Frequent blood sampling  Procedure Details Consent: Unable to obtain consent because of emergent medical necessity. Time Out: Verified patient identification, verified procedure, site/side was marked, verified correct patient position, special equipment/implants available, medications/allergies/relevent history reviewed, required imaging and test results available.  Performed  Maximum sterile technique was used including antiseptics, cap, gloves, gown, hand hygiene, mask and sheet. Skin prep: Chlorhexidine; local anesthetic administered 20 gauge catheter was inserted into right femoral artery using the Seldinger technique.  Evaluation Blood flow good; BP tracing good. Complications: No apparent complications.   Brett Canales Calynn Ferrero ACNP Adolph Pollack PCCM Pager [phone] till 3 pm If no answer page [phone] 06/13/2017, 9:05 AM

## 2017-07-03 NOTE — Consult Note (Signed)
PULMONARY / CRITICAL CARE MEDICINE   Name: Dylan [name] MRN: [id number] DOB: 1965-03-30    ADMISSION DATE:  04/09/2017 CONSULTATION DATE:  06-16-2017  REFERRING MD:  Dr. Jerald Kief, Tired  CHIEF COMPLAINT:  Can't breath  HISTORY OF PRESENT ILLNESS:   52 yo male with hx of MS with quadriplegia, spinal abscess, spinal stenosis, renal failure, UTI, candidemia with altered mental status, hypoxia, hypercapnia.  Unable to protect airway.  Called emergently for intubation.  No additional hx could be obtained from patient.  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  PAST SURGICAL HISTORY: He  has a past surgical history that includes Nose surgery; Anterior cervical decomp/discectomy fusion (N/A, 03/25/2017); IR Fluoro Guide CV Line Right (05/07/2017); IR US Guide Vasc Access Right (05/07/2017); Insertion of dialysis catheter (Right, 05/22/2017); and AV fistula placement (Right, 05/10/2017).  No Known Allergies  No current facility-administered medications on file prior to encounter.    Current Outpatient Medications on File Prior to Encounter  Medication Sig  . acetaminophen (TYLENOL) 325 MG tablet Take 650 mg by mouth every 4 (four) hours as needed.  Marland Kitchen amiodarone (PACERONE) 200 MG tablet Take 1 tablet (200 mg total) by mouth daily.  Marland Kitchen b complex vitamins tablet Take 1 tablet by mouth daily.  . bisacodyl (DULCOLAX) 5 MG EC tablet Take 1 tablet (5 mg total) by mouth daily as needed for moderate constipation.  Marland Kitchen ceFEPime (MAXIPIME) IVPB Inject 2 g into the vein every 12 (twelve) hours. Indication:  Spinal abscess Last Day of Therapy:  05/16/2017 Labs - Once weekly:  CBC/D and BMP, Labs - Every other week:  ESR and CRP  . collagenase (SANTYL) ointment Apply 1 application  topically daily as needed (wound care).   Marland Kitchen dexamethasone (DECADRON) 1.5 MG tablet Take 2 tablets (3 mg total) by mouth every 8 (eight) hours.  Marland Kitchen diltiazem (CARDIZEM) 30 MG tablet Take 1 tablet (30 mg total) by mouth every 8 (eight) hours.  Marland Kitchen GLUCERNA (GLUCERNA) LIQD Glucerna 1.5 - Give 240 ml three times daily  . insulin aspart (NOVOLOG FLEXPEN) 100 UNIT/ML FlexPen Inject as per sliding scale subcutaneously before meals: 0 - 120 = 0 Units 121 - 150 = 2 units 151 - 200 = 4 units 201 - 250 = 6 units 251 - 300 = 8 units 301 - 350 = 10 units 351 - 400 = 12 units 401 - 450 = 14 units Notify Provider is less than 60 or greater than 450  . insulin aspart (NOVOLOG FLEXPEN) 100 UNIT/ML FlexPen Inject 10 Units into the skin 3 (three) times daily with meals.   . Insulin Glargine (LANTUS SOLOSTAR) 100 UNIT/ML Solostar Pen Inject 70 Units into the skin at bedtime.   Marland Kitchen LORazepam (ATIVAN) 0.5 MG tablet Take 1 tablet (0.5 mg total) by mouth every 6 (six) hours as needed for anxiety. X 2 weeks  . Melatonin 3 MG TABS Take 3 mg by mouth at bedtime.  . metFORMIN (GLUMETZA) 1000 MG (MOD) 24 hr tablet Take 1,000 mg by mouth daily with breakfast.  . methocarbamol (ROBAXIN) 500 MG tablet Take 1 tablet (500 mg total) by mouth every 6 (six) hours as needed for muscle spasms.  . Multiple Vitamin (MULTIVITAMIN) tablet Take 1 tablet by mouth daily.  . ondansetron (ZOFRAN) 4 MG tablet Take 1 tablet (4  mg total) by mouth every 6 (six) hours as needed for nausea.  . pantoprazole (PROTONIX) 40 MG tablet Take 1 tablet (40 mg total) by mouth daily.  . promethazine (PHENERGAN) 25 MG/ML injection Inject 25 mg into the vein every 8 (eight) hours as needed for nausea or vomiting.  . senna-docusate (SENOKOT-S) 8.6-50 MG tablet Take 1 tablet by mouth at bedtime as needed for mild constipation.  . sertraline (ZOLOFT) 50 MG tablet Take 50 mg by mouth daily.   . sodium chloride 0.9 % injection Inject 10 mLs into the vein 2 (two)  times daily. Before and after antibiotics  . traZODone (DESYREL) 50 MG tablet Take 50 mg by mouth at bedtime as needed for sleep.  Marland Kitchen zolpidem (AMBIEN) 10 MG tablet Take 10 mg by mouth at bedtime as needed for sleep.  . vancomycin IVPB Inject 500 mg into the vein every 12 (twelve) hours. Indication:  Spinal abscess Last Day of Therapy:  05/16/2017 Labs - Sunday/Monday:  CBC/D, BMP, and vancomycin trough. Labs - Thursday:  BMP and vancomycin trough Labs - Every other week:  ESR and CRP (Patient not taking: Reported on 04/02/2017)    FAMILY HISTORY:  His indicated that the status of his father is unknown.   SOCIAL HISTORY: He  reports that he has never smoked. He has never used smokeless tobacco. He reports that he does not drink alcohol or use drugs.  REVIEW OF SYSTEMS:   Unable to obtain.  SUBJECTIVE:   VITAL SIGNS: BP 103/69 (BP Location: Right Leg)   Pulse 83   Temp (!) 97.3 F (36.3 C) (Axillary)   Resp (!) 34   Ht 6' 4" (1.93 m)   Wt 258 lb (117 kg)   SpO2 (!) 88%   BMI 31.40 kg/m   VENTILATOR SETTINGS:    INTAKE / OUTPUT: I/O last 3 completed shifts: In: 2706 [P.O.:520; I.V.:260; Blood:315; Other:40] Out: 2325 [Urine:2325]  PHYSICAL EXAMINATION:  General - obtunded Eyes - pupils reactive ENT - poor dentition, dry mucosa Cardiac - regular, no murmur Chest - poor air movement, b/l rhonchi Abd - soft, non tender Ext - contracted Skin - sacral Neuro - not following   LABS:  BMET Recent Labs  Lab 06/09/17 0543 06/10/17 0502 2017-06-17 0455  NA 143 143 141  K 4.7 4.7 4.4  CL 106 106 105  CO2 _0 BUN 61* 64* 71*  CREATININE 3.99* 4.07* 4.17*  GLUCOSE 222* 166* 207*    Electrolytes Recent Labs  Lab 06/09/17 0543 06/10/17 0502 Jun 17, 2017 0455  CALCIUM 7.5* 7.4* 7.3*  PHOS 4.7* 4.3 4.5    CBC Recent Labs  Lab 06/10/17 0502 06/10/17 1839 2017/06/17 0455  WBC 24.3* 23.2* 17.2*  HGB 7.8* 9.5* 9.9*  HCT 23.7* 28.5* 29.9*  PLT 34* 29* 29*     Coag's No results for input(s): APTT, INR in the last 168 hours.  Sepsis Markers No results for input(s): LATICACIDVEN, PROCALCITON, O2SATVEN in the last 168 hours.  ABG Recent Labs  Lab 06-17-2017 0700  PHART 7.268*  PCO2ART 54.6*  PO2ART 48.7*    Liver Enzymes Recent Labs  Lab 06/05/17 0458  06/09/17 0543 06/10/17 0502 06/17/2017 0455  AST 23  --   --   --   --   ALT 56  --   --   --   --   ALKPHOS 115  --   --   --   --   BILITOT 1.1  --   --   --   --  ALBUMIN 1.8*   < > 1.8* 1.8* 1.7*   < > = values in this interval not displayed.    Cardiac Enzymes No results for input(s): TROPONINI, PROBNP in the last 168 hours.  Glucose Recent Labs  Lab 06/09/17 2139 06/10/17 0610 06/10/17 1203 06/10/17 1706 06/10/17 2221 07-04-17 0640  GLUCAP 143* 172* 154* 143* 265* 191*    Imaging Dg Chest Port 1 View  Result Date: 2017/07/04 CLINICAL DATA:  52 year old male with shortness of breath. EXAM: PORTABLE CHEST 1 VIEW COMPARISON:  Chest radiograph dated 06/09/2017 FINDINGS: Right-sided dialysis catheter in similar position. There is cardiomegaly with diffuse interstitial and alveolar edema, worsened compared to the earlier radiograph. Pneumonia is not excluded. There are probable bilateral pleural effusions. No pneumothorax. No acute osseous pathology. IMPRESSION: Interval progression of the CHF compared to the earlier radiograph. Electronically Signed   By: Anner Crete M.D.   On: 07-04-2017 06:30     STUDIES:  Echo 4/04 >> mod LVH, EF 55% MRI brain 5/03 >> chronic demyelinating disease, atrophy Renal u/s 5/06 >> medical renal disease  CULTURES: Blood 3/30 >> Candida glabrata Urine 4/27 >> E coli, Pseudomonas, Staph aureus Blood 5/09 >>  Urine 5/09 >> Sputum 5/10 >>   ANTIBIOTICS: Cipro 5/02 >> 5/08   SIGNIFICANT EVENTS: 5/10 Transfer to ICU  LINES/TUBES: ETT 5/10 >>   DISCUSSION: 52 yo male with hypoxic/hypercapnic respiratory failure in setting  of MS with quadriplegia, CKD IV, UTI, Candidemia, Sacral wound.  ASSESSMENT / PLAN:  Acute hypoxic/hypercapnic respiratory failure. - full vent support - f/u CXR, ABG  Acute metabolic encephalopathy. MS with quadriplegia, depression. - RASS goal 0 to -1  HTN, A fib. - continue amiodarone  CKD 4. - Tired hospitalist called renal back 5/10  UTI, sacral wound, recent candidemia. - ID following  DM type II. - SSI  Thrombocytopenia. - f/u CBC  DVT prophylaxis - SCD SUP - protonix Nutrition - NPO Goals of care - full code  CC time 36 minutes  Chesley Mires, MD [place] 2017-07-04, 7:56 AM

## 2017-07-03 NOTE — Significant Event (Signed)
Rapid Response Event Note  Overview: Time Called: 0647 Arrival Time: 0650 Event Type: Respiratory  Initial Focused Assessment: 52 yo male with agonal respirations on NRB mask.  Sats 83%. Pt obtunded and arousable to physical stimuli. Temp 97.3 F, HR 83, BP 103/69, RR 30-40.  BBS shallow and diminished in the bases.  Interventions: -PCXR (already done) -ABG -NRB 100% -BIPAP - Recommend intubation if BIPAP not tolerated or ABG poor -Tx ICU     Event Summary:   at      at          [place]

## 2017-07-03 NOTE — Progress Notes (Signed)
PT Cancellation Note  Patient Details Name: Dylan [name] MRN: [id number] DOB: 1965-05-29   Cancelled Treatment:    Reason Eval/Treat Not Completed: Medical issues which prohibited therapy. New hydrotherapy evaluation received, chart reviewed. Note that pt intubated and transferred to ICU this morning. Will hold today and check back tomorrow for appropriateness to initiate hydrotherapy.    Marylynn Pearson 06/23/2017, 9:11 AM   Conni Slipper, PT, DPT Acute Rehabilitation Services Pager: [phone]

## 2017-07-03 NOTE — Progress Notes (Signed)
PROGRESS NOTE  Dylan [name] ZOX:[id number] DOB: 26-Apr-1965 DOA: 04/21/2017 PCP: Patient, No Pcp Per   LOS: 41 days   Brief Narrative / Interim history: Patient is a 52 year old male with past medical history significant for recent quadriplegia, multiple sclerosis, decompression of the spinal abscess, spinal stenosis who has been  admitted for the management of acute kidney injury.  He was recently admitted to neurosurgery on 04/12/2017 and had surgery(corpectomy/decompression surgery) on 03/25/17 for cord compression due to spinal stenosis at C6 ,C5 and C7.  Postoperative course was completed with atrial fibrillation, started on Eliquis but discontinued due to spinal hematoma. Also epidural clot versus abscess was suspected on his follow up  C-spine MRI. Because of the high risk patient decided not to repeat exploratory surgery.  He was also started on IV antibiotics due to complicated UTI, possible cervical abscess and was discharged to skilled nursing facility on 04/09/2017.   His Foley catheter was not draining at the skilled nursing facility so he was sent to the emergency department.  Found to have acute kidney injury. Urology was also consulted, flushed his Foley with removal of foreign body yeilding 2.5 L of urine. Currently patient has been managed for acute kidney injury thought to be secondary to possible vancomycin related versus obstructive nephropathy.  Nephrology following.  Patient hospital course is also complicated with sepsis, Candida glabrata fungal,  anemia, progressive oliguric renal failure, hyperkalemia , thrombocytopenia. Patient underwent right-sided tunneled catheter placement for hemodialysis and right brachiocephalic AV fistula on 06/01/17. His disposition will be challenging.Currently he may not be able to sit for dialysis due to ulcers on his buttocks and quadriplegia.  He might need SNF with dialysis facility out of state.  Social worker following.  Nephrology stopped  dialysis about 7 days ago given the fact that patient has started to make more urine.  Now we are now awaiting pattern to see whether he will need further dialysis.  He had difficulties with his Foley catheter not being able to drain properly, requiring upgrade from 16 Fr to 18 and ultimately to 20 [place].  Urology consulted on 5/6 and recommended that the catheter needs to be tension-free and set to gravity drainage as well as patient to be positioned such a way to provide adequate catheter drainage.  If patient will not need dialysis anymore --Per nephrology -- may be able to go to SNF  Assessment & Plan: Principal Problem:   AKI (acute kidney injury) (HCC) Active Problems:   Uncontrolled diabetes mellitus type 2 without complications (HCC)   Hypertension   Morbid obesity (HCC)   Paroxysmal atrial fibrillation (HCC)   Multiple sclerosis exacerbation (HCC)   Pressure injury of skin   Hyperkalemia   Hematoma   Insomnia   Adjustment reaction with anxiety and depression   Quadriplegia (HCC)   History of fusion of cervical spine   Postoperative wound infection   Coronary artery disease   Atherosclerotic peripheral vascular disease (HCC)   MDD (major depressive disorder), single episode, moderate (HCC)   Candidemia (HCC)   Goals of care, counseling/discussion   Palliative care by specialist   Acute lower UTI  Acute hypoxic respiratory failure, hypercapnic.  Patient became hypoxic this am, requiring to be NB mask. Chest x ray showed sign of CHF. He received IV lasix.  He is lethargic, he was place on BIPAP, and CCM was consulted, for intubation.  CCM will take over patient care.  I informed Dr Conseco, change in patient condition. He will follow  up on patient.  I try to contact Deloris Laural Benes, primary contact. She doesn't answer.   Acute renal failure /bladder outlet obstruction -Thought to be secondary to obstructive uropathy versus vancomycin-induced renal injury.  Nephrology is  following.  He has been started on dialysis since 05/07/2017. Permanent  dialysis catheter placed on 2017-06-25.  -There is also concern that patient might need stretcher dialysis which would be possible only out of state. S/P right brachiocephalic AV fistula per vascular surgeon -16 French Foley with decreased output on 5/4, Foley exchanged for an 36 [place] on 5/4 and 20 Fr on 5/5. Still with poor UOP, seems to be retaining based on bladder scan. -consulted urology, recommended that the catheter needs to be tension-free and set to gravity drainage as well as patient to be positioned such a way to provide adequate catheter drainage -Now his dialysis is on hold, nephrology following and awaiting to see whether he will recover renal function or he will further need dialysis.  This will determine patient's disposition Foley draining. Urine out put 1.4  Cough;  Episode of cough morning 5-08 Chest x ray consistent with CHF. Oxygen sat remain stable.  Speech swallow evaluation.   Acute encephalopathy -also with a UTI. Treated based on sensitivities -Obtained an MRI of the brain which showed mild diffusion abnormality about lesions within the bilateral occipital lobes and left thalamus which may reflect mild acute versus subacute active demyelination changes -Neurology was consulted and recommended to continue supportive treatment, continue to treat UTI but no further interventions currently.  Lethargic this am.   Severe thrombocytopenia  -Thought to be secondary to drug related.  Anidulafungin, daptomycin and cefepime stopped.  Protonix also stopped.  HIT negative.  Patient does not receive heparin with dialysis.  Hematology was also consulted .He has received several units of platelets transfusion. Hematology feels that this is not likely immune medicine thrombocytopenia given patient response to transfusion.  -Dr. Clelia Croft following intermittently Worsening platelet count, might need transfusion.   GI  bleed/acute blood loss anemia/acute on chronic anemia  -Patient had an episode of hematochezia, GI was consulted, his bleeding has self resolved and had no further bleeding. Recommending continue close monitoring.  Recommended to reconsult if rebleeding.  No new episodes of hematochezia . -GI reluctant to do any kind of procedures unless his platelets are more than 50,000. -Hb decreased to 7.8.  -S/P  transfuse one unit due to thrombocytopenia on 5-09. Hb at 9.   Elevated troponin  -Mild.  Did not trend up.  Patient denies any chest pain.  Likely supply demand ischemia  Quadriplegia/cervical abscess versus cervical hematoma/infected cervical hematoma  -Patient completed 6 weeks of antibiotics including 4 weeks of vancomycin and cefepime followed by 2 weeks of daptomycin and cefepime.  Course completed on 05/16/2017. -Neurosurgery was following before. ID was following, signed off. -Neurosurgery recommended steroids when he was first diagnosed in March, and a very slow taper, have change Decadron to 3 times daily to twice daily on 5/5  Neurogenic bladder  -Patient was discharged with Foley catheter on 04/09/2017 in the context of new quadriplegia. -Foley catheter exhanged on admission. Foley was taken out during this admission but had to be put back again due to urinary retention.  Continue Foley catheter.  See discussion above about upsizing his catheter and urology consultation on 5/6  UTI:  -Urine culture growing Ecoli, staph as well as Pseudomonas. Pseudomonas is resistant to Cefepime. Now on  Cipro, day 7 cipro discontinue.   Leukocytosis;  Afebrile.  Chest xray 5-08 negative for PNA.  Will check UA, urine culture, blood culture.  ID consulted. Recommend doppler and to stop cipro.  Follow blood culture.  Check urine culture.   Candidemia  -Patient had 10 days of  Anidulafungin which has been discontinued due to thrombocytopenia. -Ophthalmology also consulted, no evidence of  intraocular fungal infection.  Echocardiogram did not show any vegetation. -follow repeated blood culture.   Diabetes type 2  -Continue Lantus, sliding scale insulin.  -CBGs 120s to 160s keep on current regimen  Paroxysmal A. Fib -No anticoagulation given his current thrombocytopenia and concern for postop hematoma to his C-spine.   -Continue amiodarone, metoprolol and Cardizem. -sinus on telemetry   Multiple sclerosis  -Recently diagnosed on previous admission and he was given 5 days of high-dose steroids with improvement.  Neurology was consulted at that time.  -MRI obtained on 5/3 as described above, neurology was consulted on 5/4, appreciate input  Adjustment disorder/depression  -Continue mirtazapine.  Psychiatry was following.  Recommended trazodone.  Hyponatremia/hypokalemia  -Currently stable  Unstageable pressure injury on bilateral buttocks  -Continue supportive care.  Continue wound care.  His pressure injury on bilateral buttocks is making him unable to sit for dialysis.  Nocturnal hypoxia  -No history of OSA or the use of CPAP.  Continue supplemental oxygen as needed/CPAP at night.  Goals of care  -Palliative care was consulted before.  Remains full code    DVT prophylaxis: SCD Code Status: Full code Family Communication: no family at bedside. Try to contact Deloris Laural Benes , not answer.  Disposition Plan: to be determine.   Consultants:   Nephrology   Hematology   Neurology  Psychiatry   ID  Ophthalmology  GI  Procedures:  Echocardiogram, upper external vein mapping, HD  Antimicrobials:  Ciprofloxacin 5/2 >> Prior antibiotics include daptomycin, Eraxis, cefepime, vancomycin, Zosyn  Subjective: He is lethargic today, worsening hypoxemia.    Objective: Vitals:   06/27/2017 0443 06/08/2017 0500 06/12/2017 0657 06/27/2017 0706  BP: (!) 168/92  103/69   Pulse: 85  83   Resp: 20 (!) 28 (!) 34   Temp:    (!) 97.3 F (36.3 C)  TempSrc:     Axillary  SpO2: 96% 95% (!) 88%   Weight:      Height:        Intake/Output Summary (Last 24 hours) at 06/02/2017 0725 Last data filed at 06/05/2017 0547 Gross per 24 hour  Intake 855 ml  Output 1425 ml  Net -570 ml   Filed Weights   06/09/17 0403 06/10/17 0342 06/08/2017 0440  Weight: 109.8 kg (242 lb 1 oz) 115.7 kg (255 lb) 117 kg (258 lb)    Examination:  Constitutional: lethargic Respiratory: bilateral wheezing, decreased respiration  Cardiovascular; S 1, S 2 RRR Abdomen: Soft, NT, ND Skin: No rash  Neurologic: Quadriplegic. Lethargic.    Data Reviewed: I have independently reviewed following labs and imaging studies   CBC: Recent Labs  Lab 06/08/17 0744 06/09/17 0543 06/10/17 0502 06/10/17 1839 06/14/2017 0455  WBC 22.2* 23.5* 24.3* 23.2* 17.2*  NEUTROABS  --   --  22.5*  --   --   HGB 8.5* 8.1* 7.8* 9.5* 9.9*  HCT 25.6* 24.9* 23.7* 28.5* 29.9*  MCV 88.0 88.6 88.1 87.2 86.9  PLT 30* 35* 34* 29* 29*   Basic Metabolic Panel: Recent Labs  Lab 06/07/17 0303 06/08/17 0744 06/09/17 0543 06/10/17 0502 06/03/2017 0455  NA 139 141 143 143 141  K  4.5 4.4 4.7 4.7 4.4  CL 103 104 106 106 105  CO2 23 25 26 27 23   GLUCOSE 83 142* 222* 166* 207*  BUN 54* 59* 61* 64* 71*  CREATININE 3.57* 3.87* 3.99* 4.07* 4.17*  CALCIUM 7.1* 7.2* 7.5* 7.4* 7.3*  PHOS 3.9 4.8* 4.7* 4.3 4.5   GFR: Estimated Creatinine Clearance: 29.6 mL/min (A) (by C-G formula based on SCr of 4.17 mg/dL (H)). Liver Function Tests: Recent Labs  Lab 06/05/17 0458  06/07/17 0303 06/08/17 0744 06/09/17 0543 06/10/17 0502 06/09/2017 0455  AST 23  --   --   --   --   --   --   ALT 56  --   --   --   --   --   --   ALKPHOS 115  --   --   --   --   --   --   BILITOT 1.1  --   --   --   --   --   --   PROT 5.3*  --   --   --   --   --   --   ALBUMIN 1.8*   < > 1.8* 1.8* 1.8* 1.8* 1.7*   < > = values in this interval not displayed.   No results for input(s): LIPASE, AMYLASE in the last 168  hours. Recent Labs  Lab 06/06/17 1454  AMMONIA 29   Coagulation Profile: No results for input(s): INR, PROTIME in the last 168 hours. Cardiac Enzymes: No results for input(s): CKTOTAL, CKMB, CKMBINDEX, TROPONINI in the last 168 hours. BNP (last 3 results) No results for input(s): PROBNP in the last 8760 hours. HbA1C: No results for input(s): HGBA1C in the last 72 hours. CBG: Recent Labs  Lab 06/10/17 0610 06/10/17 1203 06/10/17 1706 06/10/17 2221 06/15/2017 0640  GLUCAP 172* 154* 143* 265* 191*   Lipid Profile: No results for input(s): CHOL, HDL, LDLCALC, TRIG, CHOLHDL, LDLDIRECT in the last 72 hours. Thyroid Function Tests: No results for input(s): TSH, T4TOTAL, FREET4, T3FREE, THYROIDAB in the last 72 hours. Anemia Panel: No results for input(s): VITAMINB12, FOLATE, FERRITIN, TIBC, IRON, RETICCTPCT in the last 72 hours. Urine analysis:    Component Value Date/Time   COLORURINE YELLOW 06/10/2017 1806   APPEARANCEUR HAZY (A) 06/10/2017 1806   LABSPEC 1.006 06/10/2017 1806   PHURINE 7.0 06/10/2017 1806   GLUCOSEU 50 (A) 06/10/2017 1806   HGBUR LARGE (A) 06/10/2017 1806   BILIRUBINUR NEGATIVE 06/10/2017 1806   KETONESUR NEGATIVE 06/10/2017 1806   PROTEINUR NEGATIVE 06/10/2017 1806   NITRITE NEGATIVE 06/10/2017 1806   LEUKOCYTESUR LARGE (A) 06/10/2017 1806   Sepsis Labs: Invalid input(s): PROCALCITONIN, LACTICIDVEN  No results found for this or any previous visit (from the past 240 hour(s)).    Radiology Studies: Dg Chest Port 1 View  Result Date: 06/19/2017 CLINICAL DATA:  52 year old male with shortness of breath. EXAM: PORTABLE CHEST 1 VIEW COMPARISON:  Chest radiograph dated 06/09/2017 FINDINGS: Right-sided dialysis catheter in similar position. There is cardiomegaly with diffuse interstitial and alveolar edema, worsened compared to the earlier radiograph. Pneumonia is not excluded. There are probable bilateral pleural effusions. No pneumothorax. No acute osseous  pathology. IMPRESSION: Interval progression of the CHF compared to the earlier radiograph. Electronically Signed   By: Elgie Collard M.D.   On: 06/10/2017 06:30   Dg Chest Port 1 View  Result Date: 06/09/2017 CLINICAL DATA:  Cough. History of atrial fibrillation, mitral stenosis, diabetes, multiple scleroses, acute kidney injury.  EXAM: PORTABLE CHEST 1 VIEW COMPARISON:  Chest x-ray of 2017/06/07 FINDINGS: The lungs are reasonably well inflated. The interstitial markings are increased. There is a moderate-sized right pleural effusion and trace left pleural effusion. The cardiac silhouette is enlarged. There is calcification in the wall of the aortic arch. The dialysis catheter tip projects at the cavoatrial junction. The bony thorax exhibits no acute abnormality. IMPRESSION: CHF with interstitial edema and bilateral pleural effusions. The findings are slightly more conspicuous today. Thoracic aortic atherosclerosis. Electronically Signed   By: David  [place] M.D.   On: 06/09/2017 13:40     Scheduled Meds: . amiodarone  200 mg Oral Daily  . amLODipine  5 mg Oral Daily  . chlorhexidine  15 mL Mouth Rinse BID  . collagenase   Topical Daily  . darbepoetin (ARANESP) injection - NON-DIALYSIS  100 mcg Subcutaneous Q Thu-1800  . dexamethasone  3 mg Oral Q12H  . diltiazem  120 mg Oral QHS  . feeding supplement (ENSURE ENLIVE)  237 mL Oral TID BM  . guaiFENesin  600 mg Oral BID  . insulin aspart  0-5 Units Subcutaneous QHS  . insulin aspart  0-9 Units Subcutaneous TID WC  . insulin aspart  2 Units Subcutaneous TID WC  . insulin glargine  12 Units Subcutaneous Daily  . mouth rinse  15 mL Mouth Rinse q12n4p  . metoprolol tartrate  12.5 mg Oral BID  . mirtazapine  30 mg Oral QHS  . multivitamin  1 tablet Oral QHS   Continuous Infusions: . sodium chloride    . sodium chloride    . sodium chloride     Hartley Barefoot, MD [facility] Pager [phone]  If 7PM-7AM, please contact  night-coverage [email or web address] Password [email or web address] 06/30/2017, 7:25 AM

## 2017-07-03 NOTE — Progress Notes (Addendum)
OT NOTE  RN STAFF  Please check splint every 2 hours during shift ( remove splint ) to assess for: * pain * redness *swelling *skin break down  If any symptoms above are present remove splint for 15 minutes. If symptoms continue - keep the splint removed and notify OT staff [phone] immediately.   Keep the splinted upper extremity elevated at all times on pillows   Splint can be wiped down with purple wipe.   Splints are to be worn for 2 hours and off for 2 hours    To place the splint on:  1. Place the wrist in position first and secure strap 2. Position each digit and apply strap 3. The thumb and forearm strap should be applied last    The splints should fit as appeared here Strap over the PIP joint of finger Strap over the MCP ( knuckles)  joints of the hand Strap at the thumb Strap at the wrist Strap at the forearm   [facility], OT/L  [phone] 06/19/2017

## 2017-07-03 NOTE — Progress Notes (Signed)
Pt transported on vent to ICU following intubation.

## 2017-07-03 NOTE — Progress Notes (Signed)
Called to patient bedside to speak to family. Advance Directive was located in EMR (uploaded on 3/09). Patient stated Dylan [name]) was primary Health Care Agent with [name] as secondary (Significant Other). In the advance directive he stated as well that he wished not to undergo prolong life saving measures if he had a condition that was not reversible, or would have a condition that would alter ability to think that would not get better. He has also on previous admissions been declared a DNR.   Primary HCPOA along with remaining family agree and understand that we have currently maximized all medical therapies. All agree to continue current medical management however will updated code status to DNR.   CC Time: 42 minutes   [name], AGACNP-BC [facility]  Pgr: [phone]  PCCM Pgr: [phone]

## 2017-07-03 NOTE — Progress Notes (Addendum)
PT Cancellation/Discharge Note  Patient Details Name: Dylan [name] MRN: [id number] DOB: 1965/12/24   Cancelled Treatment:    Reason Eval/Treat Not Completed: Medical issues which prohibited therapy; patient intubated and transferred to ICU on vent.  Also noted MD discontinued PT.  Will sign off and await new orders.  Elray Mcgregor 2017/06/13, 8:29 AM  Sheran Lawless, PT [phone] Jun 13, 2017

## 2017-07-03 NOTE — Procedures (Signed)
Intubation Procedure Note Dylan [name] [id number] 26-Sep-1965  Procedure: Intubation Indications: Airway protection and maintenance  Procedure Details Consent: Unable to obtain consent because of emergent medical necessity. Time Out: Verified patient identification, verified procedure, site/side was marked, verified correct patient position, special equipment/implants available, medications/allergies/relevent history reviewed, required imaging and test results available.  Performed  MAC and 3 Medications:  Fentanyl 100 mcg Etomidate 20 mg Versed 2 mg NMB none   Evaluation Hemodynamic Status: BP stable throughout; O2 sats: stable throughout Patient's Current Condition: stable Complications: No apparent complications Patient did tolerate procedure well. Chest X-ray ordered to verify placement.  CXR: pending. 7.7 OT placed with cords well visualized, secured at 24 cm. =bbsh. Dylan [name] Dylan [name] ACNP [facility] PCCM Pager [phone] till 3 pm If no answer page [phone] 17-Jun-2017, 7:48 AM

## 2017-07-03 NOTE — Discharge Summary (Addendum)
Death Summary  Dylan [name]:[id number] DOB: [birth year] DOA: 05-03-17  PCP: Patient, No Pcp Per  Admit date: 05/03/17 Date of Death: Jun 17, 2017  Final Diagnoses:    Acute Respiratory Failure   Thrombocytopenia.    AKI (acute kidney injury) (HCC)   Uncontrolled diabetes mellitus type 2 without complications (HCC)   Hypertension   Morbid obesity (HCC)   Paroxysmal atrial fibrillation (HCC)   Multiple sclerosis exacerbation (HCC)   Pressure injury of skin   Hyperkalemia   Hematoma   Insomnia   Adjustment reaction with anxiety and depression   Quadriplegia (HCC)   History of fusion of cervical spine   Postoperative wound infection   Coronary artery disease   Atherosclerotic peripheral vascular disease (HCC)   MDD (major depressive disorder), single episode, moderate (HCC)   Candidemia (HCC)   Goals of care, counseling/discussion   Palliative care by specialist   Acute lower UTI   ESRD (end stage renal disease) (HCC)  GI bleed/acute blood loss anemia/acute on chronic anemia    Quadriplegia/cervical abscess versus cervical hematoma/infected cervical hematoma    Neurogenic bladder    Multiple sclerosis    Adjustment disorder/depression    Unstageable pressure injury on bilateral buttocks    Hospital Course:  Brief Narrative / Interim history: Patient is a 52 year old male with past medical history significant for recent quadriplegia, recent diagnosis of multiple sclerosis, decompression of the spinal abscess, spinal stenosis who has been admitted for the management of acute kidney injury. He was recently admitted to neurosurgery on 04/12/2017 and had surgery(corpectomy/decompression surgery) on 03/25/17 for cord compression due to spinal stenosis at C6 ,C5 and C7. Postoperative course was completed with atrial fibrillation, started on Eliquis but discontinued due to spinal hematoma. Also epidural clot versus abscess was suspected on his follow up C-spine MRI. Because of  the high risk patient decided not to repeat exploratory surgery.  He was also started on IV antibiotics due to complicated UTI, possible cervical abscess and was discharged to skilled nursing facility on 04/09/2017.  His Foley catheter was not draining at the skilled nursing facility so he was sent to the emergency department. Found to have acute kidney injury. Urology was also consulted, flushed his Foley with removal of foreign body yeilding 2.5 L of urine. Patient also during this hospitalization develop GI bleed, hematochezia, in setting of thrombocytopenia. He received blood transfusion, platelet transfusion. GI was consulted, patient was thought to be too high risk for colonoscopy. Subsequently his hb remain stable and GI bleed stop.   Patient has been managed for acute kidney injury thought to be secondary to possible vancomycin related versus obstructive nephropathy. Nephrology following. Patient hospital course is also complicated with sepsis, Candida glabrata fungal,  anemia, progressive oliguric renal failure, hyperkalemia , thrombocytopenia. He was treated with antifungal for 14 days. He also  completed 6 weeks of IV antibiotics on 4-14 for cervical infection. He received 7 days of ciprofloxacin for UTI  until 5-09. Marland Kitchen   Patient underwent right-sided tunneled catheter placement for hemodialysis and right brachiocephalic AV fistulaon 06/01/17. He may not be able to sit for out patient dialysis due to ulcers on his buttocks and quadriplegia.  He had difficulties with his Foley catheter not being able to drain properly, requiring upgrade from 16 Fr to 18 and ultimately to 20 [place].  Urology consulted on 5/6 and recommended that the catheter needs to be tension-free and set to gravity drainage as well as patient to be positioned such a way to  provide adequate catheter drainage.   Nephrology stopped dialysis about 7 days ago given the fact that patient has started to make more urine. Patient on  5-08 had one coughing spell , chest xray was done and showed interstitial edema. patient oxygen saturation was stable on 3 L of oxygen. At that time he was having good urine out put and he was not SOB. He was evaluated by speech, regular diet, thin and aspiration precaution was recommend. Patient at moderate risk for aspiration. Patient Hb decreased to 7.8 on 5-09. He received one unit PRBC and 80 Mg IV lasix. Post transfusion hb increased to 9. His platelet count continue to be stable in the 30 range.. On May 9 , Patient was not having any cough or  any worsening dyspnea. He was having persistent leukocytosis, ID was consulted, blood culture ordered which were negative.  On 5-10 morning patient became acutely  more hypoxic, requiring 15 L oxygen NBM , he was also started on BIPAP. Due to concern of not protecting airway he was intubated. CCM was consulted, patient was transfer to ICU. Subsequently he became hypotensive, he was started on IV pressors. Renal was informed of patient change in condition, plan was to start CRRT. Patient continue to required maximal medical therapy. Family reviewed prior advanced directive with CCM. Patient was made DNR. Patient HR decreased, he went in asystole. Patient died at 22;33.     Time: of Death 22;30  Signed:  Arminta Gamm A Zeta Bucy  [facility] 06/15/2017, 6:18 PM

## 2017-07-03 NOTE — Progress Notes (Signed)
[facility] Progress Note    Assessment/ Plan:   52yo male with HTN, MS quadriplegia, Afib, DM, recent decompression of spinal abscess and spinal stenosis who was being followed for AKI and obstructive uropathy. Was s/p TDC placement on 05/25/2017 and RUE AVF on 06/01/17 with last HD on 06/02/17.  Had sudden hypoxic/hypercapnic respiratory failure requiring emergent intubation this morning 1. Acute metabolic encephalopathy in setting CKD IV. Now tachycardiac and hypotensive. Start CRRT. 2. Hypoxic/hypercapnic respiratory failure on full vent support per CCM.  3. UTI, sacral wound. Per primary  4. Anemia on ESA, s/p 2U RBC. Stable Hgb ~9.  5. MS with quadriplegia, recent spinal abscess. Completed treatment per primary and ID.  6. Thrombocytopenia, likely medication induced from PPI, antifungal. HIT negative. Plt stable at ~30. Per hematology.  7. Afib, on amiodarone per primary 8. Diabetes per primary  Subjective:   In ICU, intubated and unresponsive.   Objective:   BP (!) 66/33   Pulse 81   Temp (!) 97.3 F (36.3 C) (Axillary)   Resp 18   Ht 6' 4" (1.93 m)   Wt 242 lb 1 oz (109.8 kg)   SpO2 (!) 89%   BMI 29.47 kg/m   Intake/Output Summary (Last 24 hours) at 06/16/2017 1105 Last data filed at 06/29/2017 0547 Gross per 24 hour  Intake 855 ml  Output 1425 ml  Net -570 ml   Weight change: 3 lb (1.361 kg)  Physical Exam: Gen: obtunded  CVS: tachycardic, regular, no murmur Resp: intubated on vent, coarse rhonchi. No crackles. Poor air movement bilaterally  Abd:soft, nontender, + bowel sounds Ext:no LE edema. Arms contracted  Imaging: Dg Chest Port 1 View  Result Date: 06/20/2017 CLINICAL DATA:  52-year-old male with shortness of breath. EXAM: PORTABLE CHEST 1 VIEW COMPARISON:  Chest radiograph dated 06/09/2017 FINDINGS: Right-sided dialysis catheter in similar position. There is cardiomegaly with diffuse interstitial and alveolar edema, worsened compared to the  earlier radiograph. Pneumonia is not excluded. There are probable bilateral pleural effusions. No pneumothorax. No acute osseous pathology. IMPRESSION: Interval progression of the CHF compared to the earlier radiograph. Electronically Signed   By: Arash  Radparvar M.D.   On: 06/16/2017 06:30   Dg Chest Port 1 View  Result Date: 06/09/2017 CLINICAL DATA:  Cough. History of atrial fibrillation, mitral stenosis, diabetes, multiple scleroses, acute kidney injury. EXAM: PORTABLE CHEST 1 VIEW COMPARISON:  Chest x-ray of May 31, 2017 FINDINGS: The lungs are reasonably well inflated. The interstitial markings are increased. There is a moderate-sized right pleural effusion and trace left pleural effusion. The cardiac silhouette is enlarged. There is calcification in the wall of the aortic arch. The dialysis catheter tip projects at the cavoatrial junction. The bony thorax exhibits no acute abnormality. IMPRESSION: CHF with interstitial edema and bilateral pleural effusions. The findings are slightly more conspicuous today. Thoracic aortic atherosclerosis. Electronically Signed   By: David  Jordan M.D.   On: 06/09/2017 13:40    Labs: BMET Recent Labs  Lab 06/05/17 0359 06/06/17 0943 06/07/17 0303 06/08/17 0744 06/09/17 0543 06/10/17 0502 06/10/2017 0455  NA 138 139 139 141 143 143 141  K 4.2 5.0 4.5 4.4 4.7 4.7 4.4  CL 99* 103 103 104 106 106 105  CO2 26 24 23 25 26 27 23  GLUCOSE 193* 241* 83 142* 222* 166* 207*  BUN 36* 48* 54* 59* 61* 64* 71*  CREATININE 3.22* 3.48* 3.57* 3.87* 3.99* 4.07* 4.17*  CALCIUM 6.9* 7.0* 7.1* 7.2* 7.5* 7.4* 7.3*    PHOS 4.3 4.2 3.9 4.8* 4.7* 4.3 4.5   CBC Recent Labs  Lab 06/09/17 0543 06/10/17 0502 06/10/17 1839 06/24/2017 0455  WBC 23.5* 24.3* 23.2* 17.2*  NEUTROABS  --  22.5*  --   --   HGB 8.1* 7.8* 9.5* 9.9*  HCT 24.9* 23.7* 28.5* 29.9*  MCV 88.6 88.1 87.2 86.9  PLT 35* 34* 29* 29*    Medications:    . amiodarone  200 mg Per Tube Daily  .  chlorhexidine gluconate (MEDLINE KIT)  15 mL Mouth Rinse BID  . collagenase   Topical Daily  . darbepoetin (ARANESP) injection - NON-DIALYSIS  100 mcg Subcutaneous Q Thu-1800  . dexamethasone  3 mg Per Tube Q12H  . fentaNYL      . insulin aspart  0-9 Units Subcutaneous Q4H  . mouth rinse  15 mL Mouth Rinse QID  . multivitamin  1 tablet Oral QHS  . pantoprazole sodium  40 mg Per Tube Daily  . sodium bicarbonate          Elsia J Yoo, DO PGY-2, [facility] 06/13/2017 11:05 AM   

## 2017-07-03 NOTE — Progress Notes (Addendum)
ED CSW received phone call from pt's RN. Pt's family reported that an Advance Directive (Living Will) was created while pt was in the hospital. No Living Will on file with the hospital. CSW was unable to find notes of a Living Will in pt's chart.   CSW reached out to [place] to see if they have a copy of a Living Will. [facility] asked CSW to call back tomorrow between 8 and 5 to speak with a social worker to see if they are able to locate the living will.   CSW paged Chaplain. Chaplain supported in looking for living will in the pt's chart. Chaplain and CSW met with pt's family and friends in pt's room. Family reported that a living will was created with palliative care. CSW reached out to Palliative Care in the hospital and [place]. CSW left voicemails for both.   Chaplain spoke more in depth with pt's cousin about what pt would want. Pt's attending paged to come discuss medical status with family.  Wendelyn Breslow, [facility]  [phone]

## 2017-07-03 NOTE — Progress Notes (Signed)
Late entry for 07-11-17 @0720 . Accompanied patient with respiratory, RRN and NT to 15M ICU after patient was intubated at bedside. Patient's family was notified by Dr. Carmela Rima.Patient's belongs currently in 3W13, to be taken to ICU by NT.

## 2017-07-03 NOTE — Progress Notes (Signed)
Or[facility] Progress Note Patient Details:  Dylan [name] 05-Jan-1966 [id number]  Patient ID: Dylan [name], male   DOB: 01/06/1966, 52 y.o.   MRN: [id number]   Dylan [name] 06/10/2017, 9:32 [facility] [employer] for left elbow brace.

## 2017-07-03 DEATH — deceased

## 2017-07-07 NOTE — Addendum Note (Signed)
Addended by: Kirt Boys on: 07/07/2017 09:28 AM   Modules accepted: Level of Service

## 2017-07-14 ENCOUNTER — Encounter (HOSPITAL_COMMUNITY): Payer: Medicaid Other

## 2017-07-14 ENCOUNTER — Encounter: Payer: Medicaid Other | Admitting: Vascular Surgery

## 2017-09-29 ENCOUNTER — Encounter: Payer: Self-pay | Admitting: Internal Medicine

## 2019-04-26 IMAGING — CT CT CERVICAL SPINE W/O CM
3 of 4 series · 13 of 33 positions shown, 16 images · non-contrast
Comparison: CT cervical spine 04/12/2017

MR cervical spine 04/01/2017

CLINICAL DATA: Elevated WBC.  Evaluate for spinal flexion.

EXAM:
CT CERVICAL SPINE WITHOUT CONTRAST
TECHNIQUE: Multidetector CT imaging of the cervical spine was performed without
intravenous contrast. Multiplanar CT image reconstructions were also
generated.

[Series 6: orthogonal bone · axial · 0.29mm/px · z∈[+1469,+1580]mm · 5 of 89 slices shown, 7 images]
[im 15/89  soft-tissue]
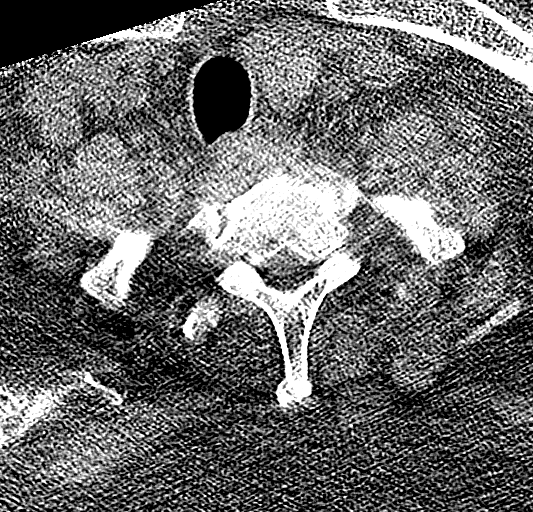
[im 15/89  bone]
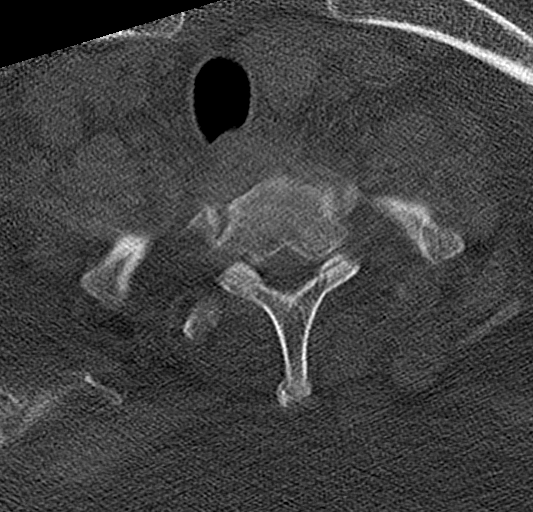
[im 30/89  bone]
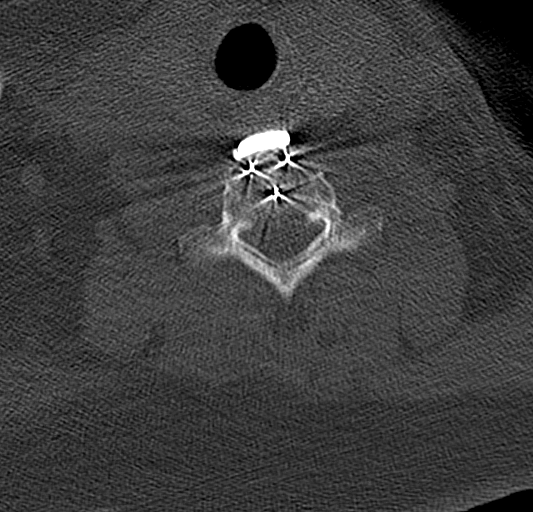
[im 45/89  bone]
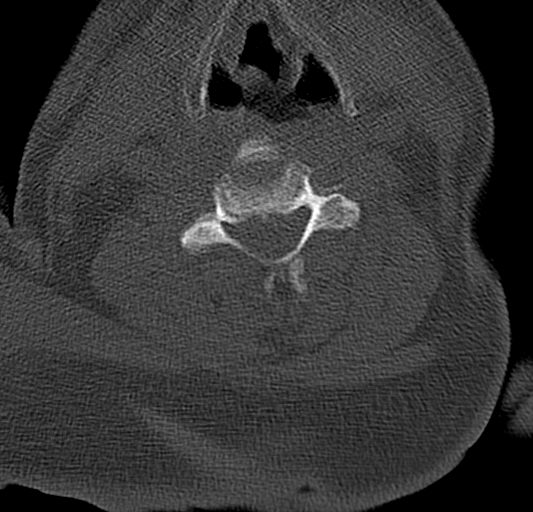
[im 59/89  bone]
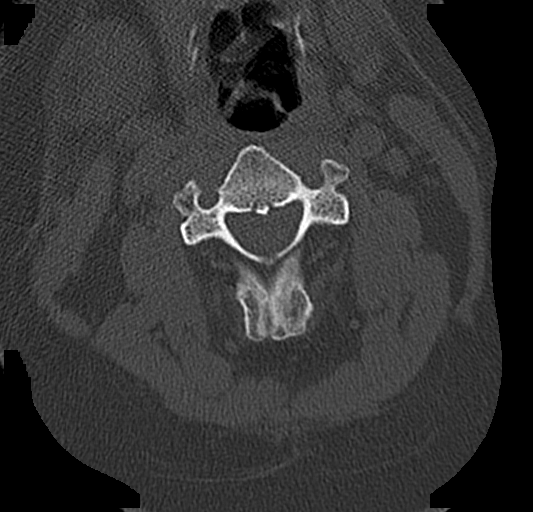
[im 74/89  soft-tissue]
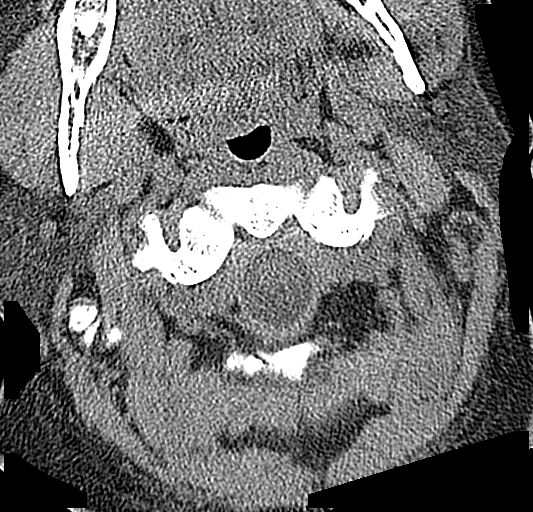
[im 74/89  bone]
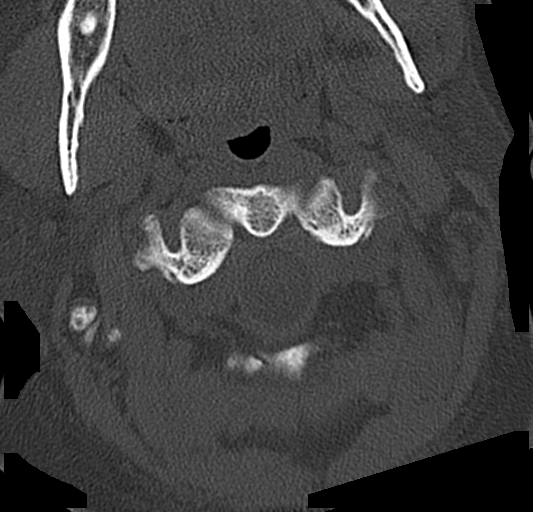

[Series 7: coronal bone · coronal · 0.23mm/px · 3 of 61 slices shown]
[im 13/61  bone]
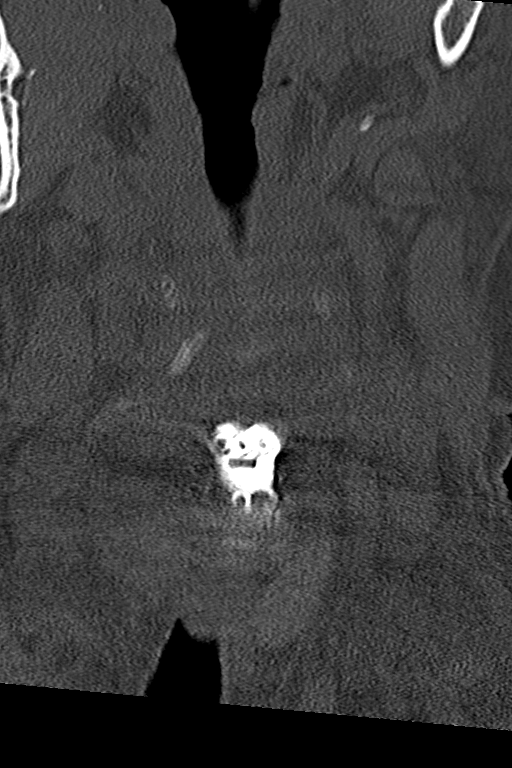
[im 25/61  bone]
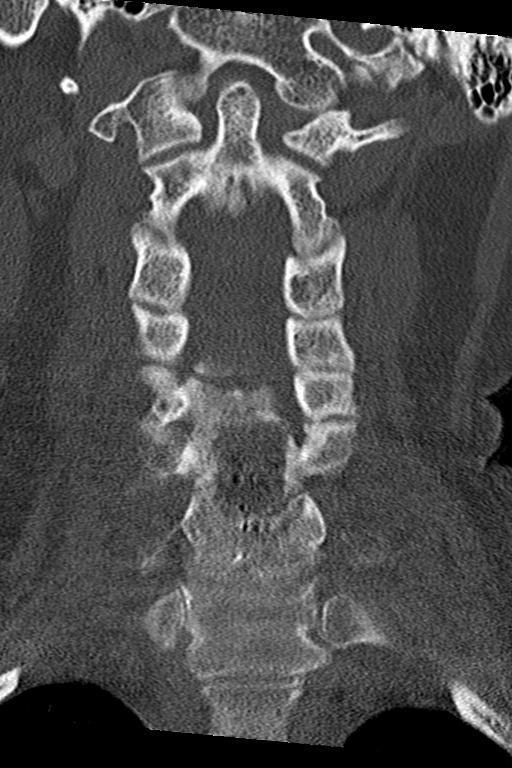
[im 37/61  bone]
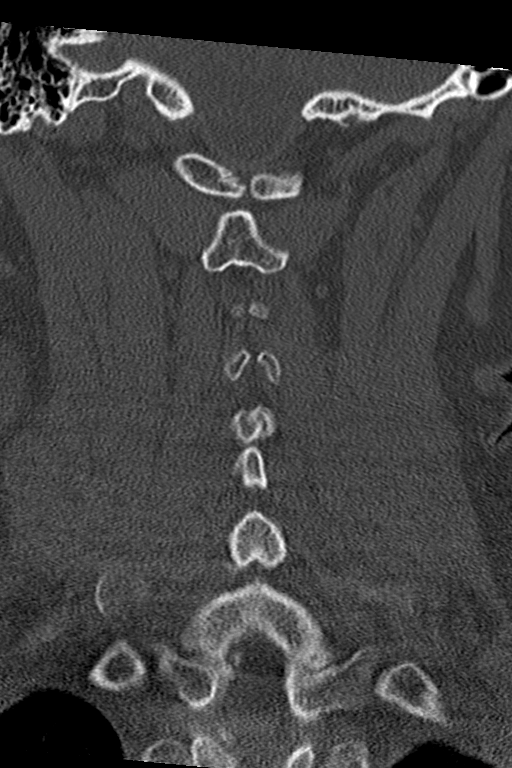

[Series 8: sagittal bone · sagittal · 0.30mm/px · 5 of 61 slices shown, 6 images]
[im 21/61  bone]
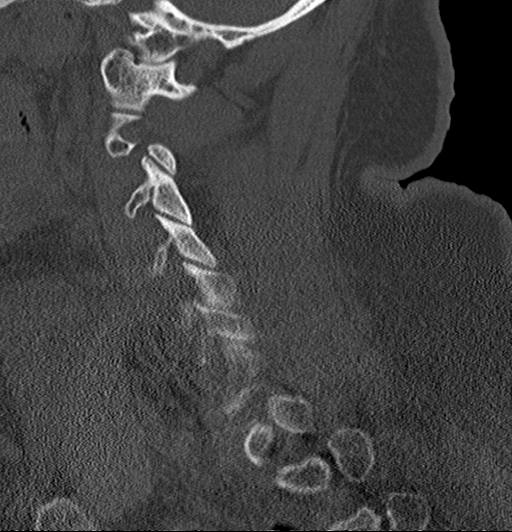
[im 26/61  bone]
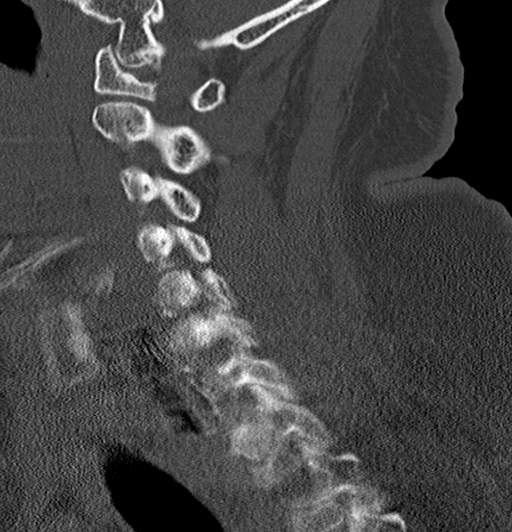
[im 31/61  soft-tissue]
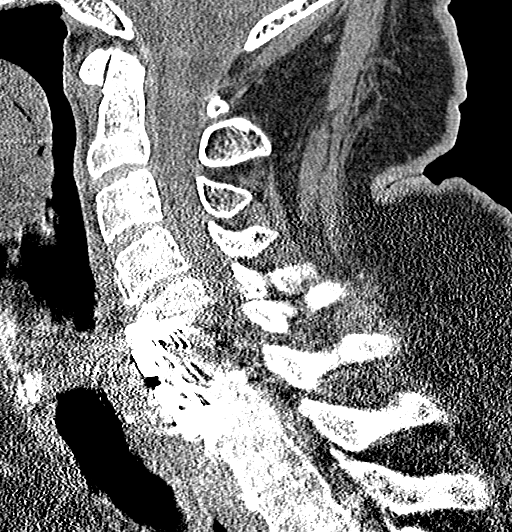
[im 31/61  bone]
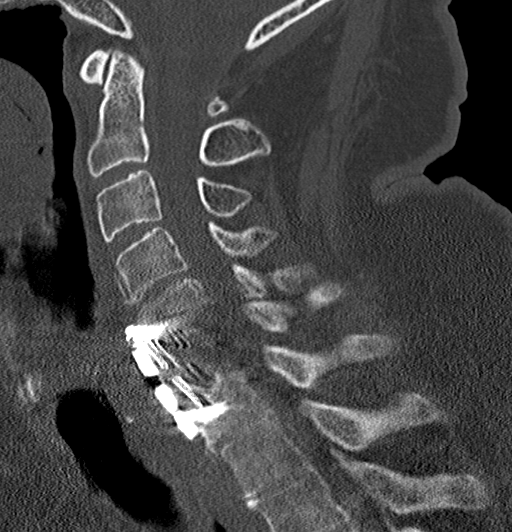
[im 36/61  bone]
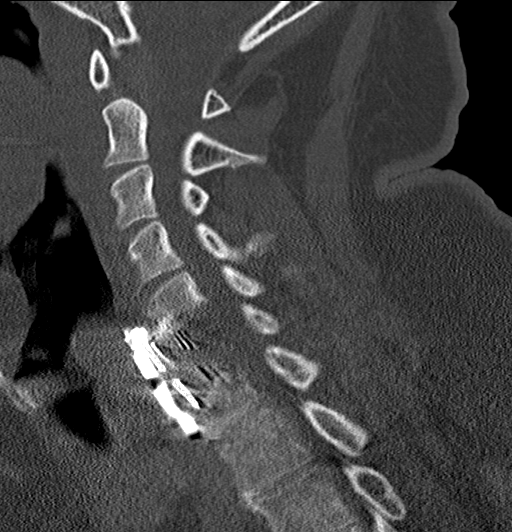
[im 41/61  bone]
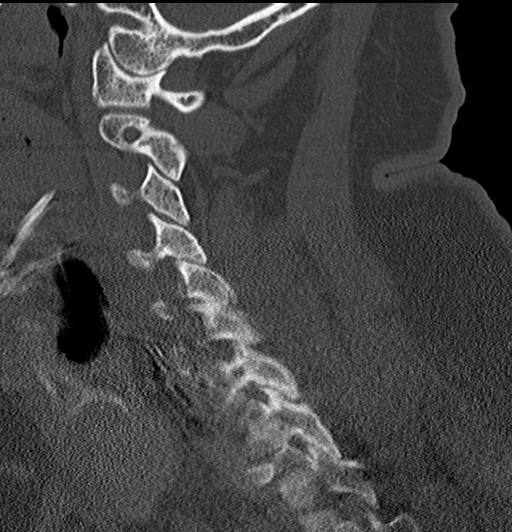

[13 of 33 positions shown; findings below may reference images not displayed]

FINDINGS: Alignment: Normal.

Skull base and vertebrae: No acute fracture. No primary bone lesion
or focal pathologic process.

Soft tissues and spinal canal: No prevertebral fluid or swelling. No
visible canal hematoma. Mild soft tissue prominence along the right
anterolateral aspect of C6-7 vertebral body likely reflecting
resolving hematoma, but infection cannot be completely excluded.
Hematoma anterior to the right sternocleidomastoid muscle in the
subcutaneous fat is partially visualized and decreased in size
compared with 04/12/2017.

Disc levels: Prior C6 corpectomy with anterior cervical fusion from
C5 through C7. severe right and moderate left foraminal stenosis at
C5-6. Moderate right foraminal stenosis at C6-7. Mild bilateral
foraminal stenosis at C7-T1 and T1-2.

Upper chest: Lung apices are clear.
IMPRESSION: 1. Prior C6 corpectomy with anterior cervical fusion from C5 through
C7. severe right and moderate left foraminal stenosis at C5-6.
Moderate right foraminal stenosis at C6-7. Mild soft tissue
prominence along the right anterolateral aspect of C6-7 vertebral
body likely reflecting resolving hematoma, but infection cannot be
completely excluded.
2. Hematoma anterior to the right sternocleidomastoid muscle in the
subcutaneous fat is partially visualized and decreased in size
compared with 04/12/2017.
